# Patient Record
Sex: Male | Born: 1935
Health system: Southern US, Community
[De-identification: ages and names within clinical notes are randomized; demographics above are authoritative.]

## PROBLEM LIST (undated history)

## (undated) ENCOUNTER — Emergency Department: Discharge status: Absent without leave | Payer: Medicare Other

## (undated) DIAGNOSIS — R0789 Other chest pain: Secondary | ICD-10-CM

## (undated) DIAGNOSIS — I2510 Atherosclerotic heart disease of native coronary artery without angina pectoris: Secondary | ICD-10-CM

## (undated) DIAGNOSIS — K219 Gastro-esophageal reflux disease without esophagitis: Secondary | ICD-10-CM

## (undated) DIAGNOSIS — Z9289 Personal history of other medical treatment: Secondary | ICD-10-CM

## (undated) DIAGNOSIS — N183 Chronic kidney disease, stage 3 unspecified: Secondary | ICD-10-CM

## (undated) DIAGNOSIS — H9190 Unspecified hearing loss, unspecified ear: Secondary | ICD-10-CM

## (undated) DIAGNOSIS — F0390 Unspecified dementia without behavioral disturbance: Secondary | ICD-10-CM

## (undated) DIAGNOSIS — K222 Esophageal obstruction: Secondary | ICD-10-CM

## (undated) DIAGNOSIS — F419 Anxiety disorder, unspecified: Secondary | ICD-10-CM

## (undated) DIAGNOSIS — E785 Hyperlipidemia, unspecified: Secondary | ICD-10-CM

## (undated) DIAGNOSIS — C9140 Hairy cell leukemia not having achieved remission: Secondary | ICD-10-CM

## (undated) DIAGNOSIS — I10 Essential (primary) hypertension: Secondary | ICD-10-CM

## (undated) DIAGNOSIS — R001 Bradycardia, unspecified: Secondary | ICD-10-CM

## (undated) DIAGNOSIS — C9590 Leukemia, unspecified not having achieved remission: Secondary | ICD-10-CM

## (undated) DIAGNOSIS — J45909 Unspecified asthma, uncomplicated: Secondary | ICD-10-CM

## (undated) DIAGNOSIS — M1990 Unspecified osteoarthritis, unspecified site: Secondary | ICD-10-CM

## (undated) DIAGNOSIS — N400 Enlarged prostate without lower urinary tract symptoms: Secondary | ICD-10-CM

## (undated) DIAGNOSIS — D649 Anemia, unspecified: Secondary | ICD-10-CM

## (undated) HISTORY — DX: Leukemia, unspecified not having achieved remission: C95.90

## (undated) HISTORY — DX: Benign prostatic hyperplasia without lower urinary tract symptoms: N40.0

## (undated) HISTORY — PX: CATARACT EXTRACTION: SUR2

## (undated) HISTORY — DX: Atherosclerotic heart disease of native coronary artery without angina pectoris: I25.10

## (undated) HISTORY — PX: EYE SURGERY: SHX253

## (undated) HISTORY — DX: Bradycardia, unspecified: R00.1

## (undated) HISTORY — DX: Anxiety disorder, unspecified: F41.9

## (undated) HISTORY — DX: Hairy cell leukemia not having achieved remission: C91.40

## (undated) HISTORY — DX: Hyperlipidemia, unspecified: E78.5

## (undated) HISTORY — PX: UPPER GASTROINTESTINAL ENDOSCOPY: SHX188

## (undated) HISTORY — DX: Other chest pain: R07.89

## (undated) HISTORY — DX: Anemia, unspecified: D64.9

## (undated) HISTORY — DX: Personal history of other medical treatment: Z92.89

## (undated) HISTORY — DX: Esophageal obstruction: K22.2

## (undated) SURGERY — Surgical Case
Anesthesia: *Unknown

---
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
## 1998-08-05 HISTORY — PX: CORONARY ARTERY BYPASS GRAFT: SHX141

## 1999-02-05 ENCOUNTER — Encounter: Payer: Self-pay | Admitting: Cardiology

## 1999-02-08 ENCOUNTER — Inpatient Hospital Stay (HOSPITAL_COMMUNITY): Admission: AD | Admit: 1999-02-08 | Discharge: 1999-02-12 | Payer: Self-pay | Admitting: Cardiology

## 1999-02-08 ENCOUNTER — Encounter: Payer: Self-pay | Admitting: Surgery

## 1999-02-09 ENCOUNTER — Encounter: Payer: Self-pay | Admitting: Surgery

## 1999-02-10 ENCOUNTER — Encounter: Payer: Self-pay | Admitting: Surgery

## 1999-02-11 ENCOUNTER — Encounter: Payer: Self-pay | Admitting: Cardiothoracic Surgery

## 1999-02-12 ENCOUNTER — Encounter: Payer: Self-pay | Admitting: Surgery

## 2005-01-25 ENCOUNTER — Ambulatory Visit: Payer: Self-pay | Admitting: Internal Medicine

## 2005-05-21 ENCOUNTER — Ambulatory Visit: Payer: Self-pay | Admitting: Internal Medicine

## 2005-06-05 ENCOUNTER — Inpatient Hospital Stay: Payer: Self-pay | Admitting: Internal Medicine

## 2005-06-06 ENCOUNTER — Other Ambulatory Visit: Payer: Self-pay

## 2005-06-10 ENCOUNTER — Ambulatory Visit: Payer: Self-pay | Admitting: Internal Medicine

## 2005-06-14 ENCOUNTER — Inpatient Hospital Stay: Payer: Self-pay | Admitting: Internal Medicine

## 2005-07-05 ENCOUNTER — Ambulatory Visit: Payer: Self-pay | Admitting: Internal Medicine

## 2005-08-05 ENCOUNTER — Ambulatory Visit: Payer: Self-pay | Admitting: Internal Medicine

## 2005-09-05 ENCOUNTER — Ambulatory Visit: Payer: Self-pay | Admitting: Internal Medicine

## 2005-10-03 ENCOUNTER — Ambulatory Visit: Payer: Self-pay | Admitting: Internal Medicine

## 2005-11-03 ENCOUNTER — Ambulatory Visit: Payer: Self-pay | Admitting: Internal Medicine

## 2005-12-20 ENCOUNTER — Ambulatory Visit: Payer: Self-pay | Admitting: Internal Medicine

## 2006-01-17 ENCOUNTER — Ambulatory Visit: Payer: Self-pay | Admitting: Internal Medicine

## 2006-02-28 ENCOUNTER — Ambulatory Visit: Payer: Self-pay | Admitting: Internal Medicine

## 2006-03-05 ENCOUNTER — Ambulatory Visit: Payer: Self-pay | Admitting: Internal Medicine

## 2006-04-18 ENCOUNTER — Ambulatory Visit: Payer: Self-pay | Admitting: Internal Medicine

## 2006-05-05 ENCOUNTER — Ambulatory Visit: Payer: Self-pay | Admitting: Internal Medicine

## 2006-06-13 ENCOUNTER — Ambulatory Visit: Payer: Self-pay | Admitting: Internal Medicine

## 2006-07-05 ENCOUNTER — Ambulatory Visit: Payer: Self-pay | Admitting: Internal Medicine

## 2006-08-08 ENCOUNTER — Ambulatory Visit: Payer: Self-pay | Admitting: Internal Medicine

## 2006-09-05 ENCOUNTER — Ambulatory Visit: Payer: Self-pay | Admitting: Internal Medicine

## 2006-10-06 ENCOUNTER — Ambulatory Visit: Payer: Self-pay | Admitting: Internal Medicine

## 2006-11-04 ENCOUNTER — Ambulatory Visit: Payer: Self-pay | Admitting: Internal Medicine

## 2006-12-04 ENCOUNTER — Ambulatory Visit: Payer: Self-pay | Admitting: Internal Medicine

## 2006-12-12 ENCOUNTER — Ambulatory Visit: Payer: Self-pay | Admitting: Internal Medicine

## 2007-01-04 ENCOUNTER — Ambulatory Visit: Payer: Self-pay | Admitting: Internal Medicine

## 2007-01-16 ENCOUNTER — Ambulatory Visit: Payer: Self-pay | Admitting: Internal Medicine

## 2007-02-03 ENCOUNTER — Ambulatory Visit: Payer: Self-pay | Admitting: Internal Medicine

## 2007-03-06 ENCOUNTER — Ambulatory Visit: Payer: Self-pay | Admitting: Internal Medicine

## 2007-03-13 ENCOUNTER — Ambulatory Visit: Payer: Self-pay | Admitting: Gastroenterology

## 2007-03-23 ENCOUNTER — Ambulatory Visit: Payer: Self-pay | Admitting: Internal Medicine

## 2007-04-06 ENCOUNTER — Ambulatory Visit: Payer: Self-pay | Admitting: Internal Medicine

## 2007-05-15 ENCOUNTER — Ambulatory Visit: Payer: Self-pay | Admitting: Internal Medicine

## 2007-06-06 ENCOUNTER — Ambulatory Visit: Payer: Self-pay | Admitting: Internal Medicine

## 2007-07-06 ENCOUNTER — Ambulatory Visit: Payer: Self-pay | Admitting: Internal Medicine

## 2007-07-17 ENCOUNTER — Ambulatory Visit: Payer: Self-pay | Admitting: Internal Medicine

## 2007-08-06 ENCOUNTER — Ambulatory Visit: Payer: Self-pay | Admitting: Internal Medicine

## 2007-09-18 ENCOUNTER — Ambulatory Visit: Payer: Self-pay | Admitting: Internal Medicine

## 2007-10-04 ENCOUNTER — Ambulatory Visit: Payer: Self-pay | Admitting: Internal Medicine

## 2007-11-26 ENCOUNTER — Ambulatory Visit: Payer: Self-pay | Admitting: Internal Medicine

## 2007-12-04 ENCOUNTER — Ambulatory Visit: Payer: Self-pay | Admitting: Internal Medicine

## 2008-01-04 ENCOUNTER — Ambulatory Visit: Payer: Self-pay | Admitting: Internal Medicine

## 2008-01-15 ENCOUNTER — Ambulatory Visit: Payer: Self-pay | Admitting: Internal Medicine

## 2008-02-03 ENCOUNTER — Ambulatory Visit: Payer: Self-pay | Admitting: Internal Medicine

## 2008-03-15 ENCOUNTER — Ambulatory Visit: Payer: Self-pay | Admitting: Internal Medicine

## 2008-04-05 ENCOUNTER — Ambulatory Visit: Payer: Self-pay | Admitting: Internal Medicine

## 2008-05-17 ENCOUNTER — Ambulatory Visit: Payer: Self-pay | Admitting: Internal Medicine

## 2008-06-05 ENCOUNTER — Ambulatory Visit: Payer: Self-pay | Admitting: Internal Medicine

## 2008-07-05 ENCOUNTER — Ambulatory Visit: Payer: Self-pay | Admitting: Internal Medicine

## 2008-07-19 ENCOUNTER — Ambulatory Visit: Payer: Self-pay | Admitting: Internal Medicine

## 2008-08-05 ENCOUNTER — Ambulatory Visit: Payer: Self-pay | Admitting: Internal Medicine

## 2008-09-05 ENCOUNTER — Ambulatory Visit: Payer: Self-pay | Admitting: Internal Medicine

## 2008-09-13 ENCOUNTER — Ambulatory Visit: Payer: Self-pay | Admitting: Internal Medicine

## 2008-10-03 ENCOUNTER — Ambulatory Visit: Payer: Self-pay | Admitting: Internal Medicine

## 2008-11-15 ENCOUNTER — Ambulatory Visit: Payer: Self-pay | Admitting: Internal Medicine

## 2008-12-03 ENCOUNTER — Ambulatory Visit: Payer: Self-pay | Admitting: Internal Medicine

## 2009-01-03 ENCOUNTER — Ambulatory Visit: Payer: Self-pay | Admitting: Internal Medicine

## 2009-02-02 ENCOUNTER — Ambulatory Visit: Payer: Self-pay | Admitting: Internal Medicine

## 2009-03-05 ENCOUNTER — Ambulatory Visit: Payer: Self-pay | Admitting: Internal Medicine

## 2009-04-05 ENCOUNTER — Ambulatory Visit: Payer: Self-pay | Admitting: Internal Medicine

## 2009-05-05 ENCOUNTER — Ambulatory Visit: Payer: Self-pay | Admitting: Internal Medicine

## 2009-06-05 ENCOUNTER — Ambulatory Visit: Payer: Self-pay | Admitting: Internal Medicine

## 2009-06-26 ENCOUNTER — Ambulatory Visit: Payer: Self-pay | Admitting: Internal Medicine

## 2009-07-05 ENCOUNTER — Ambulatory Visit: Payer: Self-pay | Admitting: Internal Medicine

## 2009-08-05 ENCOUNTER — Ambulatory Visit: Payer: Self-pay | Admitting: Internal Medicine

## 2009-09-04 ENCOUNTER — Encounter: Payer: Self-pay | Admitting: Cardiovascular Disease

## 2009-09-05 ENCOUNTER — Ambulatory Visit: Payer: Self-pay | Admitting: Internal Medicine

## 2009-10-03 ENCOUNTER — Ambulatory Visit: Payer: Self-pay | Admitting: Internal Medicine

## 2009-10-27 DIAGNOSIS — E785 Hyperlipidemia, unspecified: Secondary | ICD-10-CM

## 2009-11-03 ENCOUNTER — Ambulatory Visit: Payer: Self-pay | Admitting: Internal Medicine

## 2009-12-03 ENCOUNTER — Ambulatory Visit: Payer: Self-pay | Admitting: Internal Medicine

## 2010-01-03 ENCOUNTER — Ambulatory Visit: Payer: Self-pay | Admitting: Internal Medicine

## 2010-02-02 ENCOUNTER — Ambulatory Visit: Payer: Self-pay | Admitting: Internal Medicine

## 2010-02-08 ENCOUNTER — Ambulatory Visit: Payer: Self-pay | Admitting: Ophthalmology

## 2010-02-19 ENCOUNTER — Ambulatory Visit: Payer: Self-pay | Admitting: Ophthalmology

## 2010-02-27 ENCOUNTER — Ambulatory Visit: Payer: Self-pay | Admitting: Internal Medicine

## 2010-03-05 ENCOUNTER — Ambulatory Visit: Payer: Self-pay | Admitting: Internal Medicine

## 2010-04-05 ENCOUNTER — Ambulatory Visit: Payer: Self-pay | Admitting: Internal Medicine

## 2010-05-05 ENCOUNTER — Ambulatory Visit: Payer: Self-pay | Admitting: Internal Medicine

## 2010-06-05 ENCOUNTER — Ambulatory Visit: Payer: Self-pay | Admitting: Internal Medicine

## 2010-07-05 ENCOUNTER — Ambulatory Visit: Payer: Self-pay | Admitting: Internal Medicine

## 2010-08-05 ENCOUNTER — Ambulatory Visit: Payer: Self-pay | Admitting: Internal Medicine

## 2010-08-16 ENCOUNTER — Ambulatory Visit: Payer: Self-pay | Admitting: Internal Medicine

## 2010-09-04 NOTE — Letter (Signed)
Summary: Medical Record Release  Medical Record Release   Imported By: Harlon Flor 10/30/2009 16:24:57  _____________________________________________________________________  External Attachment:    Type:   Image     Comment:   External Document

## 2010-09-05 ENCOUNTER — Ambulatory Visit: Payer: Self-pay | Admitting: Internal Medicine

## 2010-10-18 ENCOUNTER — Ambulatory Visit: Payer: Self-pay | Admitting: Internal Medicine

## 2010-10-22 ENCOUNTER — Encounter: Payer: Self-pay | Admitting: Cardiovascular Disease

## 2010-10-23 ENCOUNTER — Other Ambulatory Visit: Payer: Self-pay | Admitting: Cardiovascular Disease

## 2010-10-23 MED ORDER — PINDOLOL 5 MG PO TABS: 5.0000 mg | ORAL_TABLET | Freq: Two times a day (BID) | ORAL | Status: DC

## 2010-10-23 NOTE — Telephone Encounter (Signed)
Pt needed a refill before appt in 11/2010.  rx sent to pharmacy

## 2010-11-01 NOTE — Miscellaneous (Signed)
Summary: Refill  Clinical Lists Changes  Medications: Added new medication of PINDOLOL 5 MG TABS (PINDOLOL) Take one tablet by mouth twice a day - Signed Rx of PINDOLOL 5 MG TABS (PINDOLOL) Take one tablet by mouth twice a day;  #90 x 4;  Signed;  Entered by: Lysbeth Galas CMA;  Authorized by: Dossie Arbour MD;  Method used: Faxed to [facility] [place], , , [place]  , Ph: [phone], Fax: [phone]  Made appt for 11/08/10 @ 10:45/sab  Prescriptions: PINDOLOL 5 MG TABS (PINDOLOL) Take one tablet by mouth twice a day  #90 x 4   Entered by:   Lysbeth Galas CMA   Authorized by:   Dossie Arbour MD   Signed by:   Lysbeth Galas CMA on 10/22/2010   Method used:   Faxed to ...       [facility] [place] (mail-order)             , [place]         Ph: [phone]       Fax: [phone]   RxID:   [id number]

## 2010-11-04 ENCOUNTER — Ambulatory Visit: Payer: Self-pay | Admitting: Internal Medicine

## 2010-11-08 ENCOUNTER — Encounter: Payer: Self-pay | Admitting: Cardiovascular Disease

## 2010-11-08 ENCOUNTER — Ambulatory Visit (INDEPENDENT_AMBULATORY_CARE_PROVIDER_SITE_OTHER): Payer: Medicare Other | Admitting: Cardiovascular Disease

## 2010-11-08 DIAGNOSIS — I498 Other specified cardiac arrhythmias: Secondary | ICD-10-CM

## 2010-11-08 DIAGNOSIS — I10 Essential (primary) hypertension: Secondary | ICD-10-CM

## 2010-11-08 DIAGNOSIS — E785 Hyperlipidemia, unspecified: Secondary | ICD-10-CM

## 2010-11-08 DIAGNOSIS — Z951 Presence of aortocoronary bypass graft: Secondary | ICD-10-CM

## 2010-11-08 DIAGNOSIS — R001 Bradycardia, unspecified: Secondary | ICD-10-CM | POA: Insufficient documentation

## 2010-11-08 MED ORDER — ASPIRIN 81 MG PO TBEC: 162.0000 mg | DELAYED_RELEASE_TABLET | Freq: Every day | ORAL | Status: DC

## 2010-11-08 NOTE — Progress Notes (Signed)
   Patient ID: Frank Mcdaniel, male    DOB: 02-07-1933, 77 y.o.   MRN: [id number]  HPI Comments: 77 year old gentleman with a history of coronary artery disease, bypass in July 2000, hairy cell leukemia, followed by Dr. Lorre Nick, who presents for routine followup. Also a history of hyperlipidemia.  He reports that he is doing well. He has gained 10 pounds since he was last seen in clinic in February 2010 at [facility]. He denies any shortness of breath, chest pain. He is active though has not been going to the gym like he normally does. Otherwise feels well with no complaints.  EKG shows sinus bradycardia with rate 53 beats per minute, right bundle branch block  Previous echocardiogram March 2007 was essentially normal  Last stress test March 2010 was a lexiscan, ejection fraction 65%, no ischemia noted, no EKG changes     Review of Systems  Constitutional: Positive for unexpected weight change.  HENT: Negative.   Eyes: Negative.   Respiratory: Negative.   Cardiovascular: Negative.   Gastrointestinal: Negative.   Musculoskeletal: Negative.   Skin: Negative.   Neurological: Negative.   Hematological: Negative.   Psychiatric/Behavioral: Negative.   All other systems reviewed and are negative.   BP 134/78  Pulse 53  Ht 5\' 9"  (1.753 m)  Wt 180 lb (81.647 kg)  BMI 26.58 kg/m2   Physical Exam  Nursing note and vitals reviewed. Constitutional: He is oriented to person, place, and time. He appears well-developed and well-nourished.  HENT:  Head: Normocephalic.  Nose: Nose normal.  Mouth/Throat: Oropharynx is clear and moist.  Eyes: Conjunctivae are normal. Pupils are equal, round, and reactive to light.  Neck: Normal range of motion. Neck supple. No JVD present.  Cardiovascular: Normal rate, regular rhythm, normal heart sounds and intact distal pulses.  Exam reveals no gallop and no friction rub.   No murmur heard. Pulmonary/Chest: Effort normal and  breath sounds normal. No respiratory distress. He has no wheezes. He has no rales. He exhibits no tenderness.  Abdominal: Soft. Bowel sounds are normal. He exhibits no distension. There is no tenderness.  Musculoskeletal: Normal range of motion. He exhibits no edema and no tenderness.  Lymphadenopathy:    He has no cervical adenopathy.  Neurological: He is alert and oriented to person, place, and time. Coordination normal.  Skin: Skin is warm and dry. No rash noted. No erythema.  Psychiatric: He has a normal mood and affect. His behavior is normal. Judgment and thought content normal.           Assessment and Plan

## 2010-11-08 NOTE — Assessment & Plan Note (Signed)
Currently with no symptoms of angina. No further workup at this time. Continue current medication regimen. 

## 2010-11-08 NOTE — Patient Instructions (Addendum)
You are doing well. INCREASE Aspirin to 2 tablets. Please call us if you have new issues that need to be addressed before your next appt.  We will call you for a follow up Appt.

## 2010-11-08 NOTE — Assessment & Plan Note (Signed)
Blood pressure is well controlled no changes made to his medication.

## 2010-11-08 NOTE — Assessment & Plan Note (Signed)
We will try to obtain his most recent lipid panel from Dr. Vear Clock. Continue him on his current medication regimen for now. We have talked to him about his recent weight gain of 10 pounds and encouraged him to increase his exercise and watch his diet.

## 2010-11-08 NOTE — Assessment & Plan Note (Signed)
He has asymptomatic bradycardia. Likely exacerbated from his low dose beta blocker. He is asymptomatic and we have not changed his medications.

## 2010-11-26 ENCOUNTER — Encounter: Payer: Self-pay | Admitting: Cardiovascular Disease

## 2010-11-28 ENCOUNTER — Encounter: Payer: Self-pay | Admitting: Cardiovascular Disease

## 2010-12-04 ENCOUNTER — Ambulatory Visit: Payer: Self-pay | Admitting: Internal Medicine

## 2011-01-04 ENCOUNTER — Ambulatory Visit: Payer: Self-pay | Admitting: Internal Medicine

## 2011-02-03 ENCOUNTER — Ambulatory Visit: Payer: Self-pay | Admitting: Internal Medicine

## 2011-03-06 ENCOUNTER — Ambulatory Visit: Payer: Self-pay | Admitting: Internal Medicine

## 2011-04-06 ENCOUNTER — Ambulatory Visit: Payer: Self-pay | Admitting: Internal Medicine

## 2011-05-06 ENCOUNTER — Ambulatory Visit: Payer: Self-pay | Admitting: Internal Medicine

## 2011-06-06 ENCOUNTER — Ambulatory Visit: Payer: Self-pay | Admitting: Internal Medicine

## 2011-06-14 ENCOUNTER — Telehealth: Payer: Self-pay

## 2011-06-14 MED ORDER — PINDOLOL 5 MG PO TABS: 5.0000 mg | ORAL_TABLET | Freq: Every day | ORAL | Status: DC

## 2011-06-14 NOTE — Telephone Encounter (Signed)
Refill sent for pindolol.

## 2011-07-11 ENCOUNTER — Ambulatory Visit: Payer: Self-pay | Admitting: Internal Medicine

## 2011-08-06 ENCOUNTER — Ambulatory Visit: Payer: Self-pay | Admitting: Internal Medicine

## 2011-08-22 LAB — CBC CANCER CENTER
Basophil #: 0 x10 3/mm (ref 0.0–0.1)
Basophil %: 0.3 %
Eosinophil #: 0.3 x10 3/mm (ref 0.0–0.7)
Eosinophil %: 5.5 %
HCT: 34.2 % — ABNORMAL LOW (ref 40.0–52.0)
HGB: 11.5 g/dL — ABNORMAL LOW (ref 13.0–18.0)
Lymphocyte %: 16.2 %
MCH: 34.1 pg — ABNORMAL HIGH (ref 26.0–34.0)
MCHC: 33.6 g/dL (ref 32.0–36.0)
MCV: 102 fL — ABNORMAL HIGH (ref 80–100)
Neutrophil #: 4.2 x10 3/mm (ref 1.4–6.5)
Neutrophil %: 67 %

## 2011-08-22 LAB — HEPATIC FUNCTION PANEL A (ARMC)
Alkaline Phosphatase: 88 U/L (ref 50–136)
Bilirubin, Direct: 0.2 mg/dL (ref 0.00–0.20)

## 2011-08-22 LAB — URIC ACID: Uric Acid: 6.2 mg/dL (ref 3.5–7.2)

## 2011-08-22 LAB — CREATININE, SERUM: Creatinine: 1.52 mg/dL — ABNORMAL HIGH (ref 0.60–1.30)

## 2011-08-22 LAB — LACTATE DEHYDROGENASE: LDH: 204 U/L (ref 87–241)

## 2011-09-06 ENCOUNTER — Ambulatory Visit: Payer: Self-pay | Admitting: Internal Medicine

## 2011-10-03 LAB — CBC CANCER CENTER
Basophil %: 0.2 %
Eosinophil #: 0.5 x10 3/mm (ref 0.0–0.7)
Eosinophil %: 7.5 %
HCT: 33.8 % — ABNORMAL LOW (ref 40.0–52.0)
Lymphocyte #: 1.2 x10 3/mm (ref 1.0–3.6)
MCH: 34.1 pg — ABNORMAL HIGH (ref 26.0–34.0)
MCV: 103 fL — ABNORMAL HIGH (ref 80–100)
Monocyte #: 0.8 x10 3/mm — ABNORMAL HIGH (ref 0.0–0.7)
Monocyte %: 12.1 %
Neutrophil #: 4 x10 3/mm (ref 1.4–6.5)
Platelet: 186 x10 3/mm (ref 150–440)
WBC: 6.5 x10 3/mm (ref 3.8–10.6)

## 2011-10-03 LAB — CREATININE, SERUM
Creatinine: 1.46 mg/dL — ABNORMAL HIGH (ref 0.60–1.30)
EGFR (Non-African Amer.): 50 — ABNORMAL LOW

## 2011-10-03 LAB — LACTATE DEHYDROGENASE: LDH: 214 U/L (ref 87–241)

## 2011-10-04 ENCOUNTER — Ambulatory Visit: Payer: Self-pay | Admitting: Internal Medicine

## 2011-11-12 ENCOUNTER — Ambulatory Visit (INDEPENDENT_AMBULATORY_CARE_PROVIDER_SITE_OTHER): Payer: Medicare Other | Admitting: Cardiovascular Disease

## 2011-11-12 ENCOUNTER — Encounter: Payer: Self-pay | Admitting: Cardiovascular Disease

## 2011-11-12 VITALS — BP 128/64 | HR 53 | Ht 69.0 in | Wt 178.5 lb

## 2011-11-12 DIAGNOSIS — R001 Bradycardia, unspecified: Secondary | ICD-10-CM

## 2011-11-12 DIAGNOSIS — I10 Essential (primary) hypertension: Secondary | ICD-10-CM

## 2011-11-12 DIAGNOSIS — Z951 Presence of aortocoronary bypass graft: Secondary | ICD-10-CM

## 2011-11-12 DIAGNOSIS — E785 Hyperlipidemia, unspecified: Secondary | ICD-10-CM

## 2011-11-12 DIAGNOSIS — I498 Other specified cardiac arrhythmias: Secondary | ICD-10-CM

## 2011-11-12 NOTE — Assessment & Plan Note (Signed)
Relatively asymptomatic from his bradycardia. We have suggested if he has significant fatigue, to cut his b-blocker in half. He could even stop the medication if significant symptoms

## 2011-11-12 NOTE — Assessment & Plan Note (Signed)
Currently with no symptoms of angina. No further workup at this time. Continue current medication regimen. 

## 2011-11-12 NOTE — Assessment & Plan Note (Signed)
Blood pressure is well controlled on today's visit. No changes made to the medications. 

## 2011-11-12 NOTE — Progress Notes (Signed)
Patient ID: Frank Mcdaniel, male    DOB: 31-May-1933, 78 y.o.   MRN: [id number]  HPI Comments: 78 year old gentleman with a history of coronary artery disease, bypass in July 2000, hairy cell leukemia, followed by Dr. Lorre Nick, who presents for routine followup. Also a history of hyperlipidemia.  He reports that he is doing well. He denies any shortness of breath, chest pain. He is active. 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 He does have rare fatigue. It does not seem he has significant problems from bradycardia which was seen previously . Otherwise feels well with no complaints.  EKG shows sinus bradycardia with rate 53 beats per minute, right bundle branch block  Previous echocardiogram March 2007 was essentially normal  Last stress test March 2010 was a lexiscan, ejection fraction 65%, no ischemia noted, no EKG changes    Outpatient Encounter Prescriptions as of 11/12/2011  Medication Sig Dispense Refill  . acyclovir (ZOVIRAX) 400 MG tablet Take 400 mg by mouth 2 (two) times daily.        [name] aspirin 81 MG EC tablet Take 2 tablets (162 mg total) by mouth daily.  30 tablet  0  . atorvastatin (LIPITOR) 40 MG tablet Take 40 mg by mouth daily. Take 1/2 tablet on Monday, Wednesday, Friday, Saturday and Sunday. Take 1 tablet on Tuesday and Thursday.      . dapsone 100 MG tablet Take 100 mg by mouth daily.        [name] dutasteride (AVODART) 0.5 MG capsule Take 0.5 mg by mouth daily. Take Monday, Wednesday, and friday       . Multiple Vitamin (MULTIVITAMIN) tablet Take 1 tablet by mouth daily.        [name] PARoxetine (PAXIL) 10 MG tablet Take 10 mg by mouth every morning.        . pindolol (VISKEN) 5 MG tablet Take 1 tablet (5 mg total) by mouth daily.  90 tablet  3  . Tamsulosin HCl (FLOMAX) 0.4 MG CAPS Take 0.4 mg by mouth daily.          Review of Systems  Constitutional: Positive for fatigue.  HENT: Negative.   Eyes: Negative.   Respiratory: Negative.     Cardiovascular: Negative.   Gastrointestinal: Negative.   Musculoskeletal: Negative.   Skin: Negative.   Neurological: Negative.   Hematological: Negative.   Psychiatric/Behavioral: Negative.   All other systems reviewed and are negative.    BP 128/64  Pulse 53  Ht 5\' 9"  (1.753 m)  Wt 178 lb 8 oz (80.967 kg)  BMI 26.36 kg/m2  Physical Exam  Nursing note and vitals reviewed. Constitutional: He is oriented to person, place, and time. He appears well-developed and well-nourished.  HENT:  Head: Normocephalic.  Nose: Nose normal.  Mouth/Throat: Oropharynx is clear and moist.  Eyes: Conjunctivae are normal. Pupils are equal, round, and reactive to light.  Neck: Normal range of motion. Neck supple. No JVD present.  Cardiovascular: Normal rate, regular rhythm, S1 normal, S2 normal, normal heart sounds and intact distal pulses.  Exam reveals no gallop and no friction rub.   No murmur heard. Pulmonary/Chest: Effort normal and breath sounds normal. No respiratory distress. He has no wheezes. He has no rales. He exhibits no tenderness.  Abdominal: Soft. Bowel sounds are normal. He exhibits no distension. There is no tenderness.  Musculoskeletal: Normal range of motion. He exhibits no edema and no tenderness.  Lymphadenopathy:    He has no cervical  adenopathy.  Neurological: He is alert and oriented to person, place, and time. Coordination normal.  Skin: Skin is warm and dry. No rash noted. No erythema.  Psychiatric: He has a normal mood and affect. His behavior is normal. Judgment and thought content normal.           Assessment and Plan

## 2011-11-12 NOTE — Assessment & Plan Note (Signed)
Cholesterol is at goal on the current lipid regimen. No changes to the medications were made.  

## 2011-11-12 NOTE — Patient Instructions (Signed)
You are doing well. No medication changes were made.  Goal total cholesterol is 150 or less Goal LDL less than 70  Please call us if you have new issues that need to be addressed before your next appt.  Your physician wants you to follow-up in: 6 months.  You will receive a reminder letter in the mail two months in advance. If you don't receive a letter, please call our office to schedule the follow-up appointment.

## 2011-11-14 ENCOUNTER — Ambulatory Visit: Payer: Self-pay | Admitting: Internal Medicine

## 2011-11-14 LAB — CBC CANCER CENTER
Eosinophil #: 0.3 x10 3/mm (ref 0.0–0.7)
Eosinophil %: 4.1 %
Lymphocyte #: 1.1 x10 3/mm (ref 1.0–3.6)
MCH: 34.3 pg — ABNORMAL HIGH (ref 26.0–34.0)
Monocyte %: 9.7 %
Neutrophil #: 5 x10 3/mm (ref 1.4–6.5)
Platelet: 186 x10 3/mm (ref 150–440)
WBC: 7.1 x10 3/mm (ref 3.8–10.6)

## 2011-11-14 LAB — CREATININE, SERUM
Creatinine: 1.66 mg/dL — ABNORMAL HIGH (ref 0.60–1.30)
EGFR (African American): 52 — ABNORMAL LOW

## 2011-11-14 LAB — LACTATE DEHYDROGENASE: LDH: 191 U/L (ref 87–241)

## 2011-11-28 LAB — CBC CANCER CENTER
Basophil #: 0 x10 3/mm (ref 0.0–0.1)
Basophil %: 0.4 %
Eosinophil #: 0.3 x10 3/mm (ref 0.0–0.7)
Eosinophil %: 4.8 %
HCT: 33.8 % — ABNORMAL LOW (ref 40.0–52.0)
Lymphocyte #: 0.8 x10 3/mm — ABNORMAL LOW (ref 1.0–3.6)
Lymphocyte %: 12.9 %
MCH: 33.2 pg (ref 26.0–34.0)
MCHC: 31.9 g/dL — ABNORMAL LOW (ref 32.0–36.0)
Monocyte #: 0.6 x10 3/mm (ref 0.2–1.0)
Monocyte %: 9.2 %
Neutrophil #: 4.4 x10 3/mm (ref 1.4–6.5)
RBC: 3.24 10*6/uL — ABNORMAL LOW (ref 4.40–5.90)
RDW: 14.4 % (ref 11.5–14.5)
WBC: 6 x10 3/mm (ref 3.8–10.6)

## 2011-11-28 LAB — CREATININE, SERUM
Creatinine: 1.4 mg/dL — ABNORMAL HIGH (ref 0.60–1.30)
EGFR (Non-African Amer.): 48 — ABNORMAL LOW

## 2011-12-04 ENCOUNTER — Ambulatory Visit: Payer: Self-pay | Admitting: Internal Medicine

## 2011-12-26 LAB — CBC CANCER CENTER
Basophil #: 0 x10 3/mm (ref 0.0–0.1)
Basophil %: 0.4 %
Eosinophil %: 5.7 %
HCT: 34.1 % — ABNORMAL LOW (ref 40.0–52.0)
Lymphocyte #: 0.9 x10 3/mm — ABNORMAL LOW (ref 1.0–3.6)
Lymphocyte %: 13.6 %
MCH: 33.3 pg (ref 26.0–34.0)
MCHC: 32.3 g/dL (ref 32.0–36.0)
MCV: 103 fL — ABNORMAL HIGH (ref 80–100)
Monocyte #: 0.5 x10 3/mm (ref 0.2–1.0)
Monocyte %: 8.4 %
Neutrophil #: 4.7 x10 3/mm (ref 1.4–6.5)
Platelet: 175 x10 3/mm (ref 150–440)
RBC: 3.3 10*6/uL — ABNORMAL LOW (ref 4.40–5.90)
WBC: 6.5 x10 3/mm (ref 3.8–10.6)

## 2011-12-26 LAB — HEPATIC FUNCTION PANEL A (ARMC)
Albumin: 3.8 g/dL (ref 3.4–5.0)
Alkaline Phosphatase: 76 U/L (ref 50–136)
Bilirubin, Direct: 0.3 mg/dL — ABNORMAL HIGH (ref 0.00–0.20)
Bilirubin,Total: 0.9 mg/dL (ref 0.2–1.0)
SGOT(AST): 25 U/L (ref 15–37)
SGPT (ALT): 19 U/L

## 2011-12-26 LAB — CREATININE, SERUM
Creatinine: 1.49 mg/dL — ABNORMAL HIGH (ref 0.60–1.30)
EGFR (African American): 52 — ABNORMAL LOW
EGFR (Non-African Amer.): 45 — ABNORMAL LOW

## 2012-01-04 ENCOUNTER — Ambulatory Visit: Payer: Self-pay | Admitting: Internal Medicine

## 2012-02-14 ENCOUNTER — Ambulatory Visit: Payer: Self-pay | Admitting: Internal Medicine

## 2012-02-14 LAB — CREATININE, SERUM
EGFR (African American): 55 — ABNORMAL LOW
EGFR (Non-African Amer.): 48 — ABNORMAL LOW

## 2012-02-14 LAB — CBC CANCER CENTER
Basophil %: 0.3 %
Eosinophil #: 0.4 x10 3/mm (ref 0.0–0.7)
Eosinophil %: 6 %
HCT: 32.7 % — ABNORMAL LOW (ref 40.0–52.0)
HGB: 10.6 g/dL — ABNORMAL LOW (ref 13.0–18.0)
Lymphocyte #: 1.1 x10 3/mm (ref 1.0–3.6)
MCH: 34 pg (ref 26.0–34.0)
MCHC: 32.4 g/dL (ref 32.0–36.0)
MCV: 105 fL — ABNORMAL HIGH (ref 80–100)
Monocyte #: 0.6 x10 3/mm (ref 0.2–1.0)
Monocyte %: 10.6 %
Neutrophil #: 3.8 x10 3/mm (ref 1.4–6.5)
Neutrophil %: 64.9 %
RBC: 3.12 10*6/uL — ABNORMAL LOW (ref 4.40–5.90)

## 2012-02-14 LAB — LACTATE DEHYDROGENASE: LDH: 196 U/L

## 2012-03-05 ENCOUNTER — Ambulatory Visit: Payer: Self-pay | Admitting: Internal Medicine

## 2012-04-02 LAB — CBC CANCER CENTER
Basophil #: 0 x10 3/mm (ref 0.0–0.1)
Basophil %: 0.4 %
Eosinophil #: 0.3 x10 3/mm (ref 0.0–0.7)
Eosinophil %: 4.3 %
HGB: 10.7 g/dL — ABNORMAL LOW (ref 13.0–18.0)
Lymphocyte %: 16.8 %
MCH: 33.8 pg (ref 26.0–34.0)
MCHC: 32.1 g/dL (ref 32.0–36.0)
Neutrophil #: 4.7 x10 3/mm (ref 1.4–6.5)
Neutrophil %: 68.6 %
Platelet: 191 x10 3/mm (ref 150–440)
RBC: 3.17 10*6/uL — ABNORMAL LOW (ref 4.40–5.90)

## 2012-04-02 LAB — IRON AND TIBC
Iron Bind.Cap.(Total): 239 ug/dL — ABNORMAL LOW (ref 250–450)
Iron Saturation: 23 %
Iron: 56 ug/dL — ABNORMAL LOW (ref 65–175)
Unbound Iron-Bind.Cap.: 183 ug/dL

## 2012-04-02 LAB — CREATININE, SERUM: EGFR (Non-African Amer.): 40 — ABNORMAL LOW

## 2012-04-02 LAB — FERRITIN: Ferritin (ARMC): 380 ng/mL (ref 8–388)

## 2012-04-02 LAB — LACTATE DEHYDROGENASE: LDH: 213 U/L (ref 81–234)

## 2012-04-05 ENCOUNTER — Ambulatory Visit: Payer: Self-pay | Admitting: Internal Medicine

## 2012-05-08 ENCOUNTER — Ambulatory Visit: Payer: Self-pay | Admitting: Internal Medicine

## 2012-05-08 LAB — CBC CANCER CENTER
Eosinophil #: 0.5 x10 3/mm (ref 0.0–0.7)
Eosinophil %: 7.3 %
HGB: 11.3 g/dL — ABNORMAL LOW (ref 13.0–18.0)
Lymphocyte %: 16.1 %
MCH: 33.5 pg (ref 26.0–34.0)
MCHC: 31.7 g/dL — ABNORMAL LOW (ref 32.0–36.0)
MCV: 106 fL — ABNORMAL HIGH (ref 80–100)
Monocyte #: 0.5 x10 3/mm (ref 0.2–1.0)
Neutrophil #: 4.5 x10 3/mm (ref 1.4–6.5)
Neutrophil %: 68.6 %
RDW: 14.4 % (ref 11.5–14.5)

## 2012-05-08 LAB — HEPATIC FUNCTION PANEL A (ARMC)
Bilirubin, Direct: 0.2 mg/dL (ref 0.00–0.20)
SGOT(AST): 24 U/L (ref 15–37)
SGPT (ALT): 18 U/L (ref 12–78)
Total Protein: 7.2 g/dL (ref 6.4–8.2)

## 2012-05-08 LAB — CREATININE, SERUM: EGFR (African American): 53 — ABNORMAL LOW

## 2012-05-08 LAB — LACTATE DEHYDROGENASE: LDH: 186 U/L (ref 85–241)

## 2012-05-11 ENCOUNTER — Encounter: Payer: Self-pay | Admitting: Cardiovascular Disease

## 2012-05-11 ENCOUNTER — Ambulatory Visit (INDEPENDENT_AMBULATORY_CARE_PROVIDER_SITE_OTHER): Payer: Medicare Other | Admitting: Cardiovascular Disease

## 2012-05-11 VITALS — BP 108/60 | HR 50 | Ht 69.0 in | Wt 175.2 lb

## 2012-05-11 DIAGNOSIS — E785 Hyperlipidemia, unspecified: Secondary | ICD-10-CM

## 2012-05-11 DIAGNOSIS — I10 Essential (primary) hypertension: Secondary | ICD-10-CM

## 2012-05-11 DIAGNOSIS — I498 Other specified cardiac arrhythmias: Secondary | ICD-10-CM

## 2012-05-11 DIAGNOSIS — R001 Bradycardia, unspecified: Secondary | ICD-10-CM

## 2012-05-11 DIAGNOSIS — Z951 Presence of aortocoronary bypass graft: Secondary | ICD-10-CM

## 2012-05-11 NOTE — Assessment & Plan Note (Signed)
Blood pressure is well controlled on today's visit. No changes made to the medications. 

## 2012-05-11 NOTE — Assessment & Plan Note (Signed)
Asymptomatic bradycardia. We have suggested he could decrease his beta blocker in half.

## 2012-05-11 NOTE — Assessment & Plan Note (Signed)
Currently with no symptoms of angina. No further workup at this time. Continue current medication regimen. 

## 2012-05-11 NOTE — Patient Instructions (Addendum)
You are doing well. No medication changes were made.  If you have fatigue or dizziness, cut the pindolol in 1/2 daily  Please call us if you have new issues that need to be addressed before your next appt.  Your physician wants you to follow-up in: 6 months.  You will receive a reminder letter in the mail two months in advance. If you don't receive a letter, please call our office to schedule the follow-up appointment.

## 2012-05-11 NOTE — Progress Notes (Signed)
Patient ID: Frank Mcdaniel, male    DOB: March 08, 1933, 78 y.o.   MRN: [id number]  HPI Comments: 78 year old gentleman with a history of coronary artery disease, bypass in July 2000, hairy cell leukemia, followed by Dr. Lorre Nick,  history of hyperlipidemia.who presents for routine followup.   He reports that he is doing well. He denies any shortness of breath, chest pain. He is active. It does not seem he has significant problems from bradycardia. Otherwise feels well with no complaints.  Previous echocardiogram March 2007 was essentially normal  Last stress test March 2010 was a lexiscan, ejection fraction 65%, no ischemia noted, no EKG changes  EKG shows sinus bradycardia with rate 50 beats per minute, right bundle branch block     Outpatient Encounter Prescriptions as of 05/11/2012  Medication Sig Dispense Refill  . acyclovir (ZOVIRAX) 400 MG tablet Take 400 mg by mouth 2 (two) times daily.        [name] aspirin 81 MG EC tablet Take 2 tablets (162 mg total) by mouth daily.  30 tablet  0  . atorvastatin (LIPITOR) 40 MG tablet Take 40 mg by mouth daily. Take 1/2 tablet on Monday, Wednesday, Friday, Saturday and Sunday. Take 1 tablet on Tuesday and Thursday.      . dapsone 100 MG tablet Take 100 mg by mouth daily.        . Multiple Vitamin (MULTIVITAMIN) tablet Take 1 tablet by mouth daily.        [name] PARoxetine (PAXIL) 10 MG tablet Take 10 mg by mouth every morning.        . pindolol (VISKEN) 5 MG tablet Take 1 tablet (5 mg total) by mouth daily.  90 tablet  3  . Tamsulosin HCl (FLOMAX) 0.4 MG CAPS Take 0.4 mg by mouth daily.        [name] DISCONTD: dutasteride (AVODART) 0.5 MG capsule Take 0.5 mg by mouth daily. Take Monday, Wednesday, and friday         Review of Systems  HENT: Negative.   Eyes: Negative.   Respiratory: Negative.   Cardiovascular: Negative.   Gastrointestinal: Negative.   Musculoskeletal: Negative.   Skin: Negative.   Neurological: Negative.   Hematological: Negative.     Psychiatric/Behavioral: Negative.   All other systems reviewed and are negative.    BP 108/60  Pulse 50  Ht 5\' 9"  (1.753 m)  Wt 175 lb 4 oz (79.493 kg)  BMI 25.88 kg/m2  Physical Exam  Nursing note and vitals reviewed. Constitutional: He is oriented to person, place, and time. He appears well-developed and well-nourished.  HENT:  Head: Normocephalic.  Nose: Nose normal.  Mouth/Throat: Oropharynx is clear and moist.  Eyes: Conjunctivae normal are normal. Pupils are equal, round, and reactive to light.  Neck: Normal range of motion. Neck supple. No JVD present.  Cardiovascular: Normal rate, regular rhythm, S1 normal, S2 normal, normal heart sounds and intact distal pulses.  Exam reveals no gallop and no friction rub.   No murmur heard. Pulmonary/Chest: Effort normal and breath sounds normal. No respiratory distress. He has no wheezes. He has no rales. He exhibits no tenderness.  Abdominal: Soft. Bowel sounds are normal. He exhibits no distension. There is no tenderness.  Musculoskeletal: Normal range of motion. He exhibits no edema and no tenderness.  Lymphadenopathy:    He has no cervical adenopathy.  Neurological: He is alert and oriented to person, place, and time. Coordination normal.  Skin: Skin is warm and dry. No rash noted.  No erythema.  Psychiatric: He has a normal mood and affect. His behavior is normal. Judgment and thought content normal.           Assessment and Plan

## 2012-05-11 NOTE — Assessment & Plan Note (Signed)
Cholesterol is at goal on the current lipid regimen. No changes to the medications were made.  

## 2012-05-12 ENCOUNTER — Telehealth: Payer: Self-pay

## 2012-05-12 NOTE — Telephone Encounter (Signed)
Okay to stop aspirin for 5 days if Dr. Bluford Kaufmann requires this Acceptable risk for procedure

## 2012-05-12 NOTE — Telephone Encounter (Signed)
Patient needs a cardiac clearance for colonoscopy with Dr. Bluford Kaufmann. The patient will also need to be off the aspirin 5 days prior to the colonoscopy. Please advise if cleared. Please fax a clearance letter and records to Att: Lanny Hurst, RN at [phone].

## 2012-05-13 NOTE — Telephone Encounter (Signed)
Clearance letter faxed to Dr. Bluford Kaufmann at [facility].

## 2012-06-05 ENCOUNTER — Ambulatory Visit: Payer: Self-pay | Admitting: Internal Medicine

## 2012-06-26 LAB — CBC CANCER CENTER
Basophil #: 0 x10 3/mm (ref 0.0–0.1)
Eosinophil %: 7.9 %
HGB: 10.9 g/dL — ABNORMAL LOW (ref 13.0–18.0)
Monocyte %: 8.7 %
Neutrophil %: 64.5 %
Platelet: 161 x10 3/mm (ref 150–440)
RBC: 3.21 10*6/uL — ABNORMAL LOW (ref 4.40–5.90)
WBC: 5.9 x10 3/mm (ref 3.8–10.6)

## 2012-06-26 LAB — CREATININE, SERUM
Creatinine: 1.36 mg/dL — ABNORMAL HIGH (ref 0.60–1.30)
EGFR (African American): 58 — ABNORMAL LOW
EGFR (Non-African Amer.): 50 — ABNORMAL LOW

## 2012-06-29 ENCOUNTER — Other Ambulatory Visit: Payer: Self-pay | Admitting: Cardiovascular Disease

## 2012-06-29 NOTE — Telephone Encounter (Signed)
Refill sent for pindolol. 

## 2012-07-07 ENCOUNTER — Ambulatory Visit: Payer: Self-pay | Admitting: Specialist

## 2012-07-08 ENCOUNTER — Ambulatory Visit: Payer: Self-pay | Admitting: Internal Medicine

## 2012-07-22 ENCOUNTER — Telehealth: Payer: Self-pay | Admitting: Cardiovascular Disease

## 2012-07-22 NOTE — Telephone Encounter (Signed)
See note

## 2012-07-22 NOTE — Telephone Encounter (Signed)
Difficult to evaluate the echocardiogram as we do not have images Only thing  to go on is someone else's conclusions. Would recommend he call our office if he has any worsening shortness of breath as we would give him a diuretic to take as needed. By report, right ventricular systolic pressures were elevated which can cause shortness breath and sometimes edema.

## 2012-07-22 NOTE — Telephone Encounter (Signed)
Pt called wanting Dr Mariah Milling to review echo he had done at [facility]. I have scanned it into pt chart

## 2012-07-23 NOTE — Telephone Encounter (Signed)
Pt informed. He says he is having no symptoms of sob or edema He says he feels "ok" I encouraged him to report any symptoms he may develop Understanding verb

## 2012-08-14 ENCOUNTER — Ambulatory Visit: Payer: Self-pay | Admitting: Internal Medicine

## 2012-08-14 LAB — CBC CANCER CENTER
Basophil #: 0 x10 3/mm (ref 0.0–0.1)
Basophil %: 0.7 %
Eosinophil #: 0.3 x10 3/mm (ref 0.0–0.7)
Eosinophil %: 6.3 %
HGB: 10.4 g/dL — ABNORMAL LOW (ref 13.0–18.0)
Lymphocyte %: 20.7 %
MCHC: 33.6 g/dL (ref 32.0–36.0)
Monocyte %: 7.3 %
RBC: 2.96 10*6/uL — ABNORMAL LOW (ref 4.40–5.90)

## 2012-08-14 LAB — CREATININE, SERUM
EGFR (African American): 57 — ABNORMAL LOW
EGFR (Non-African Amer.): 49 — ABNORMAL LOW

## 2012-08-14 LAB — LACTATE DEHYDROGENASE: LDH: 214 U/L (ref 85–241)

## 2012-09-05 ENCOUNTER — Ambulatory Visit: Payer: Self-pay | Admitting: Internal Medicine

## 2012-09-09 ENCOUNTER — Telehealth: Payer: Self-pay | Admitting: *Deleted

## 2012-09-09 NOTE — Telephone Encounter (Signed)
pt needs clearance for colonoscopy and upper endo. Pt had clearance in [place] but had to hold off. Procedure on 2/20. Needs to hold ASA 5 days fax [phone]

## 2012-09-09 NOTE — Telephone Encounter (Signed)
Clearance letter faxed to # provided

## 2012-09-25 LAB — CBC CANCER CENTER
Basophil %: 0.5 %
Eosinophil #: 0.4 x10 3/mm (ref 0.0–0.7)
Eosinophil %: 8.6 %
HCT: 30.4 % — ABNORMAL LOW (ref 40.0–52.0)
HGB: 10.2 g/dL — ABNORMAL LOW (ref 13.0–18.0)
Lymphocyte #: 1.1 x10 3/mm (ref 1.0–3.6)
MCH: 35.9 pg — ABNORMAL HIGH (ref 26.0–34.0)
MCHC: 33.6 g/dL (ref 32.0–36.0)
MCV: 107 fL — ABNORMAL HIGH (ref 80–100)
Monocyte #: 0.2 x10 3/mm (ref 0.2–1.0)
Monocyte %: 5.4 %
Neutrophil %: 61 %
Platelet: 151 x10 3/mm (ref 150–440)
RBC: 2.85 10*6/uL — ABNORMAL LOW (ref 4.40–5.90)
RDW: 15.1 % — ABNORMAL HIGH (ref 11.5–14.5)

## 2012-09-25 LAB — IRON AND TIBC: Iron Bind.Cap.(Total): 237 ug/dL — ABNORMAL LOW (ref 250–450)

## 2012-09-25 LAB — HEPATIC FUNCTION PANEL A (ARMC)
Alkaline Phosphatase: 74 U/L (ref 50–136)
Bilirubin, Direct: 0.3 mg/dL — ABNORMAL HIGH (ref 0.00–0.20)
Bilirubin,Total: 1.5 mg/dL — ABNORMAL HIGH (ref 0.2–1.0)
Total Protein: 6.7 g/dL (ref 6.4–8.2)

## 2012-09-25 LAB — CREATININE, SERUM
Creatinine: 1.56 mg/dL — ABNORMAL HIGH (ref 0.60–1.30)
EGFR (African American): 49 — ABNORMAL LOW
EGFR (Non-African Amer.): 42 — ABNORMAL LOW

## 2012-09-30 ENCOUNTER — Other Ambulatory Visit: Payer: Self-pay | Admitting: *Deleted

## 2012-09-30 MED ORDER — PINDOLOL 5 MG PO TABS: ORAL_TABLET | ORAL | Status: DC

## 2012-09-30 NOTE — Telephone Encounter (Signed)
Refilled Pindolol sent to [name] pharmacy.

## 2012-10-03 ENCOUNTER — Ambulatory Visit: Payer: Self-pay | Admitting: Internal Medicine

## 2012-10-05 ENCOUNTER — Other Ambulatory Visit: Payer: Self-pay | Admitting: *Deleted

## 2012-10-05 MED ORDER — PINDOLOL 5 MG PO TABS: ORAL_TABLET | ORAL | Status: DC

## 2012-10-05 NOTE — Telephone Encounter (Signed)
Refilled Pindolol # 90 Refill# 3 sent to [facility] pharmacy.

## 2012-11-03 ENCOUNTER — Ambulatory Visit: Payer: Self-pay | Admitting: Internal Medicine

## 2012-11-06 LAB — CBC CANCER CENTER
Eosinophil #: 0.3 x10 3/mm (ref 0.0–0.7)
Eosinophil %: 8.7 %
Lymphocyte #: 0.8 x10 3/mm — ABNORMAL LOW (ref 1.0–3.6)
MCV: 110 fL — ABNORMAL HIGH (ref 80–100)
Monocyte #: 0.2 x10 3/mm (ref 0.2–1.0)
Monocyte %: 5.1 %
Neutrophil #: 2 x10 3/mm (ref 1.4–6.5)
Neutrophil %: 60.5 %
Platelet: 138 x10 3/mm — ABNORMAL LOW (ref 150–440)
RBC: 2.77 10*6/uL — ABNORMAL LOW (ref 4.40–5.90)
RDW: 14.8 % — ABNORMAL HIGH (ref 11.5–14.5)
WBC: 3.3 x10 3/mm — ABNORMAL LOW (ref 3.8–10.6)

## 2012-11-06 LAB — CREATININE, SERUM
Creatinine: 1.61 mg/dL — ABNORMAL HIGH (ref 0.60–1.30)
EGFR (African American): 47 — ABNORMAL LOW
EGFR (Non-African Amer.): 40 — ABNORMAL LOW

## 2012-11-06 LAB — LACTATE DEHYDROGENASE: LDH: 249 U/L — ABNORMAL HIGH (ref 85–241)

## 2012-11-09 ENCOUNTER — Encounter: Payer: Self-pay | Admitting: Cardiovascular Disease

## 2012-11-09 ENCOUNTER — Ambulatory Visit (INDEPENDENT_AMBULATORY_CARE_PROVIDER_SITE_OTHER): Payer: Medicare PPO | Admitting: Cardiovascular Disease

## 2012-11-09 VITALS — BP 128/66 | HR 54 | Ht 68.0 in | Wt 147.0 lb

## 2012-11-09 DIAGNOSIS — I10 Essential (primary) hypertension: Secondary | ICD-10-CM

## 2012-11-09 DIAGNOSIS — E785 Hyperlipidemia, unspecified: Secondary | ICD-10-CM

## 2012-11-09 DIAGNOSIS — R001 Bradycardia, unspecified: Secondary | ICD-10-CM

## 2012-11-09 DIAGNOSIS — Z951 Presence of aortocoronary bypass graft: Secondary | ICD-10-CM

## 2012-11-09 DIAGNOSIS — I498 Other specified cardiac arrhythmias: Secondary | ICD-10-CM

## 2012-11-09 MED ORDER — ATORVASTATIN CALCIUM 40 MG PO TABS: 40.0000 mg | ORAL_TABLET | Freq: Every day | ORAL | Status: DC

## 2012-11-09 NOTE — Assessment & Plan Note (Signed)
Cholesterol is at goal on the current lipid regimen. No changes to the medications were made.  

## 2012-11-09 NOTE — Progress Notes (Signed)
Patient ID: Frank Mcdaniel, male    DOB: [birth year], 79 y.o.   MRN: [id number]  HPI Comments: 79 year old gentleman with a history of coronary artery disease, bypass in July 2000, hairy cell leukemia, followed by Dr. Lorre Nick,  history of hyperlipidemia.who presents for routine followup.   He reports that he is doing well. He denies any shortness of breath, chest pain. He is active. It does not seem he has significant problems from bradycardia. 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.  Previous echocardiogram March 2007 was essentially normal  Last stress test March 2010 was a lexiscan, ejection fraction 65%, no ischemia noted, no EKG changes  EKG shows sinus bradycardia with rate 54 beats per minute, right bundle branch block     Outpatient Encounter Prescriptions as of 11/09/2012  Medication Sig Dispense Refill  . acyclovir (ZOVIRAX) 400 MG tablet Take 400 mg by mouth 2 (two) times daily.        [name] aspirin 81 MG EC tablet Take 2 tablets (162 mg total) by mouth daily.  30 tablet  0  . atorvastatin (LIPITOR) 40 MG tablet Take 1 tablet (40 mg total) by mouth daily.  90 tablet  3  . dapsone 100 MG tablet Take 100 mg by mouth daily.        . Multiple Vitamin (MULTIVITAMIN) tablet Take 1 tablet by mouth daily.        [name] PARoxetine (PAXIL) 10 MG tablet Take 10 mg by mouth every morning.        . pindolol (VISKEN) 5 MG tablet TAKE 1 TABLET DAILY  90 tablet  3  . Tamsulosin HCl (FLOMAX) 0.4 MG CAPS Take 0.4 mg by mouth daily.        . [DISCONTINUED] atorvastatin (LIPITOR) 40 MG tablet Take 20 mg by mouth daily.        No facility-administered encounter medications on file as of 11/09/2012.    Review of Systems  Constitutional: Negative.   HENT: Negative.   Eyes: Negative.   Respiratory: Negative.   Cardiovascular: Negative.   Gastrointestinal: Negative.   Musculoskeletal: Negative.   Skin: Negative.    Neurological: Negative.   Psychiatric/Behavioral: Negative.   All other systems reviewed and are negative.   BP 128/66  Pulse 54  Ht 5\' 8"  (1.727 m)  Wt 147 lb (66.679 kg)  BMI 22.36 kg/m2  Physical Exam  Nursing note and vitals reviewed. Constitutional: He is oriented to person, place, and time. He appears well-developed and well-nourished.  HENT:  Head: Normocephalic.  Nose: Nose normal.  Mouth/Throat: Oropharynx is clear and moist.  Eyes: Conjunctivae are normal. Pupils are equal, round, and reactive to light.  Neck: Normal range of motion. Neck supple. No JVD present.  Cardiovascular: Normal rate, regular rhythm, S1 normal, S2 normal, normal heart sounds and intact distal pulses.  Exam reveals no gallop and no friction rub.   No murmur heard. Pulmonary/Chest: Effort normal and breath sounds normal. No respiratory distress. He has no wheezes. He has no rales. He exhibits no tenderness.  Abdominal: Soft. Bowel sounds are normal. He exhibits no distension. There is no tenderness.  Musculoskeletal: Normal range of motion. He exhibits no edema and no tenderness.  Lymphadenopathy:    He has no cervical adenopathy.  Neurological: He is alert and oriented to person, place, and time. Coordination normal.  Skin: Skin is warm and dry. No rash noted. No erythema.  Psychiatric: He  has a normal mood and affect. His behavior is normal. Judgment and thought content normal.      Assessment and Plan

## 2012-11-09 NOTE — Assessment & Plan Note (Signed)
Currently with no symptoms of angina. No further workup at this time. Continue current medication regimen. We did offer stress testing. As he feels so well, he does not want any testing at this time.

## 2012-11-09 NOTE — Patient Instructions (Addendum)
You are doing well. No medication changes were made.  Please call us if you have new issues that need to be addressed before your next appt.  Your physician wants you to follow-up in: 6 months.  You will receive a reminder letter in the mail two months in advance. If you don't receive a letter, please call our office to schedule the follow-up appointment.   

## 2012-11-09 NOTE — Assessment & Plan Note (Signed)
Asymptomatic from his bradycardia. No changes made to his medications.

## 2012-11-09 NOTE — Assessment & Plan Note (Signed)
Blood pressure is well controlled on today's visit. No changes made to the medications. 

## 2012-11-10 ENCOUNTER — Encounter: Payer: Self-pay | Admitting: Cardiovascular Disease

## 2012-11-16 LAB — CBC CANCER CENTER
Basophil #: 0 x10 3/mm (ref 0.0–0.1)
Basophil %: 0.7 %
Eosinophil #: 0.3 x10 3/mm (ref 0.0–0.7)
Eosinophil %: 6.7 %
HCT: 29.1 % — ABNORMAL LOW (ref 40.0–52.0)
HGB: 9.7 g/dL — ABNORMAL LOW (ref 13.0–18.0)
Lymphocyte %: 31.1 %
MCH: 36.4 pg — ABNORMAL HIGH (ref 26.0–34.0)
Monocyte #: 0.2 x10 3/mm (ref 0.2–1.0)
Neutrophil #: 2.2 x10 3/mm (ref 1.4–6.5)
Neutrophil %: 56.3 %
Platelet: 132 x10 3/mm — ABNORMAL LOW (ref 150–440)
RDW: 14.6 % — ABNORMAL HIGH (ref 11.5–14.5)
WBC: 4 x10 3/mm (ref 3.8–10.6)

## 2012-11-16 LAB — HEPATIC FUNCTION PANEL A (ARMC)
Albumin: 3.8 g/dL (ref 3.4–5.0)
Bilirubin,Total: 1.3 mg/dL — ABNORMAL HIGH (ref 0.2–1.0)
SGOT(AST): 26 U/L (ref 15–37)
SGPT (ALT): 24 U/L (ref 12–78)

## 2012-11-30 LAB — CBC CANCER CENTER
Basophil #: 0 x10 3/mm (ref 0.0–0.1)
Basophil %: 0.3 %
Eosinophil #: 0.2 x10 3/mm (ref 0.0–0.7)
Eosinophil %: 7.3 %
HCT: 30 % — ABNORMAL LOW (ref 40.0–52.0)
HGB: 10 g/dL — ABNORMAL LOW (ref 13.0–18.0)
Lymphocyte %: 28 %
Lymphs Abs: 0.9 x10 3/mm — ABNORMAL LOW (ref 1.0–3.6)
MCH: 36.4 pg — ABNORMAL HIGH (ref 26.0–34.0)
MCHC: 33.5 g/dL (ref 32.0–36.0)
MCV: 109 fL — ABNORMAL HIGH (ref 80–100)
Monocyte #: 0.2 x10 3/mm (ref 0.2–1.0)
Monocyte %: 5.3 %
Neutrophil #: 1.9 x10 3/mm (ref 1.4–6.5)
Neutrophil %: 59.1 %
Platelet: 134 x10 3/mm — ABNORMAL LOW (ref 150–440)
RBC: 2.76 x10 6/mm — ABNORMAL LOW (ref 4.40–5.90)
RDW: 14.5 % (ref 11.5–14.5)
WBC: 3.2 x10 3/mm — ABNORMAL LOW (ref 3.8–10.6)

## 2012-11-30 LAB — BILIRUBIN, TOTAL: Bilirubin,Total: 1.2 mg/dL — ABNORMAL HIGH (ref 0.2–1.0)

## 2012-11-30 LAB — LACTATE DEHYDROGENASE: LDH: 215 U/L (ref 85–241)

## 2012-12-01 LAB — KAPPA/LAMBDA FREE LIGHT CHAINS (ARMC)

## 2012-12-03 ENCOUNTER — Ambulatory Visit: Payer: Self-pay | Admitting: Internal Medicine

## 2012-12-14 LAB — RETICULOCYTES
Absolute Retic Count: 0.1158 10*6/uL
Reticulocyte: 4.25 % — ABNORMAL HIGH

## 2012-12-14 LAB — CBC CANCER CENTER
Basophil #: 0 x10 3/mm (ref 0.0–0.1)
Eosinophil #: 0.3 x10 3/mm (ref 0.0–0.7)
HGB: 9.9 g/dL — ABNORMAL LOW (ref 13.0–18.0)
Lymphocyte #: 1 x10 3/mm (ref 1.0–3.6)
Lymphocyte %: 27.3 %
MCHC: 33.5 g/dL (ref 32.0–36.0)
MCV: 109 fL — ABNORMAL HIGH (ref 80–100)
Monocyte %: 4.4 %
Platelet: 134 x10 3/mm — ABNORMAL LOW (ref 150–440)
RBC: 2.73 10*6/uL — ABNORMAL LOW (ref 4.40–5.90)
RDW: 14.7 % — ABNORMAL HIGH (ref 11.5–14.5)

## 2012-12-29 LAB — CBC CANCER CENTER
Basophil #: 0 x10 3/mm (ref 0.0–0.1)
Eosinophil #: 0.2 x10 3/mm (ref 0.0–0.7)
Eosinophil %: 8.1 %
HCT: 28.5 % — ABNORMAL LOW (ref 40.0–52.0)
Lymphocyte %: 26.4 %
MCH: 37.2 pg — ABNORMAL HIGH (ref 26.0–34.0)
MCHC: 34 g/dL (ref 32.0–36.0)
Neutrophil #: 1.8 x10 3/mm (ref 1.4–6.5)
Neutrophil %: 60.8 %
RBC: 2.6 10*6/uL — ABNORMAL LOW (ref 4.40–5.90)
RDW: 14.7 % — ABNORMAL HIGH (ref 11.5–14.5)
WBC: 3 x10 3/mm — ABNORMAL LOW (ref 3.8–10.6)

## 2013-01-03 ENCOUNTER — Ambulatory Visit: Payer: Self-pay | Admitting: Internal Medicine

## 2013-01-11 LAB — CBC CANCER CENTER
Basophil #: 0 x10 3/mm (ref 0.0–0.1)
Basophil %: 0.5 %
HGB: 9.5 g/dL — ABNORMAL LOW (ref 13.0–18.0)
Lymphocyte #: 0.8 x10 3/mm — ABNORMAL LOW (ref 1.0–3.6)
Lymphocyte %: 24.5 %
MCV: 112 fL — ABNORMAL HIGH (ref 80–100)
Neutrophil #: 2.1 x10 3/mm (ref 1.4–6.5)
Platelet: 134 x10 3/mm — ABNORMAL LOW (ref 150–440)
RBC: 2.47 10*6/uL — ABNORMAL LOW (ref 4.40–5.90)
RDW: 15.1 % — ABNORMAL HIGH (ref 11.5–14.5)

## 2013-01-11 LAB — CREATININE, SERUM
Creatinine: 1.57 mg/dL — ABNORMAL HIGH (ref 0.60–1.30)
EGFR (Non-African Amer.): 42 — ABNORMAL LOW

## 2013-01-11 LAB — LACTATE DEHYDROGENASE: LDH: 243 U/L — ABNORMAL HIGH (ref 85–241)

## 2013-01-29 LAB — CBC CANCER CENTER
Basophil #: 0 x10 3/mm (ref 0.0–0.1)
Basophil %: 0.2 %
Eosinophil #: 0.1 x10 3/mm (ref 0.0–0.7)
MCH: 37.1 pg — ABNORMAL HIGH (ref 26.0–34.0)
MCV: 110 fL — ABNORMAL HIGH (ref 80–100)
Neutrophil #: 4.9 x10 3/mm (ref 1.4–6.5)
Neutrophil %: 80.5 %
Platelet: 149 x10 3/mm — ABNORMAL LOW (ref 150–440)
RBC: 2.35 10*6/uL — ABNORMAL LOW (ref 4.40–5.90)
RDW: 14.9 % — ABNORMAL HIGH (ref 11.5–14.5)
WBC: 6.1 x10 3/mm (ref 3.8–10.6)

## 2013-02-02 ENCOUNTER — Ambulatory Visit: Payer: Self-pay | Admitting: Internal Medicine

## 2013-02-02 LAB — RETICULOCYTES: Reticulocyte: 4.3 % — ABNORMAL HIGH

## 2013-02-02 LAB — IRON AND TIBC
Iron: 95 ug/dL (ref 65–175)
Unbound Iron-Bind.Cap.: 104 ug/dL

## 2013-02-02 LAB — CANCER CENTER HEMOGLOBIN: HGB: 8.1 g/dL — ABNORMAL LOW (ref 13.0–18.0)

## 2013-02-02 LAB — CREATININE, SERUM: EGFR (Non-African Amer.): 38 — ABNORMAL LOW

## 2013-02-19 LAB — CBC CANCER CENTER
Basophil %: 0.2 %
HCT: 28.1 % — ABNORMAL LOW (ref 40.0–52.0)
HGB: 9.6 g/dL — ABNORMAL LOW (ref 13.0–18.0)
Lymphocyte %: 21.1 %
MCH: 37.4 pg — ABNORMAL HIGH (ref 26.0–34.0)
MCV: 109 fL — ABNORMAL HIGH (ref 80–100)
Monocyte #: 0.1 x10 3/mm — ABNORMAL LOW (ref 0.2–1.0)
Neutrophil #: 2.1 x10 3/mm (ref 1.4–6.5)
Neutrophil %: 72.6 %
Platelet: 105 x10 3/mm — ABNORMAL LOW (ref 150–440)
RBC: 2.57 10*6/uL — ABNORMAL LOW (ref 4.40–5.90)
WBC: 2.9 x10 3/mm — ABNORMAL LOW (ref 3.8–10.6)

## 2013-02-19 LAB — CREATININE, SERUM
Creatinine: 1.56 mg/dL — ABNORMAL HIGH (ref 0.60–1.30)
EGFR (African American): 49 — ABNORMAL LOW

## 2013-02-19 LAB — CLOSTRIDIUM DIFFICILE BY PCR

## 2013-02-19 LAB — LACTATE DEHYDROGENASE: LDH: 245 U/L — ABNORMAL HIGH (ref 85–241)

## 2013-02-19 LAB — POTASSIUM: Potassium: 4.4 mmol/L (ref 3.5–5.1)

## 2013-02-19 LAB — MAGNESIUM: Magnesium: 1.8 mg/dL

## 2013-03-05 ENCOUNTER — Ambulatory Visit: Payer: Self-pay | Admitting: Internal Medicine

## 2013-03-05 LAB — CBC CANCER CENTER
Basophil %: 0.3 %
Eosinophil #: 0.2 x10 3/mm (ref 0.0–0.7)
HCT: 30.3 % — ABNORMAL LOW (ref 40.0–52.0)
Lymphocyte #: 0.7 x10 3/mm — ABNORMAL LOW (ref 1.0–3.6)
Lymphocyte %: 28.4 %
MCHC: 34.3 g/dL (ref 32.0–36.0)
MCV: 108 fL — ABNORMAL HIGH (ref 80–100)
Monocyte #: 0.1 x10 3/mm — ABNORMAL LOW (ref 0.2–1.0)
RBC: 2.81 10*6/uL — ABNORMAL LOW (ref 4.40–5.90)
RDW: 16.8 % — ABNORMAL HIGH (ref 11.5–14.5)
WBC: 2.6 x10 3/mm — ABNORMAL LOW (ref 3.8–10.6)

## 2013-03-22 LAB — CBC CANCER CENTER
Basophil %: 0.6 %
Eosinophil %: 8.5 %
HCT: 25.8 % — ABNORMAL LOW (ref 40.0–52.0)
MCH: 38.4 pg — ABNORMAL HIGH (ref 26.0–34.0)
MCV: 110 fL — ABNORMAL HIGH (ref 80–100)
Monocyte #: 0.1 x10 3/mm — ABNORMAL LOW (ref 0.2–1.0)
Neutrophil #: 1.3 x10 3/mm — ABNORMAL LOW (ref 1.4–6.5)
Neutrophil %: 58 %
Platelet: 109 x10 3/mm — ABNORMAL LOW (ref 150–440)

## 2013-03-22 LAB — CREATININE, SERUM
Creatinine: 1.46 mg/dL — ABNORMAL HIGH (ref 0.60–1.30)
EGFR (African American): 53 — ABNORMAL LOW
EGFR (Non-African Amer.): 45 — ABNORMAL LOW

## 2013-03-30 LAB — CBC CANCER CENTER
Basophil %: 0.2 %
Eosinophil #: 0.2 x10 3/mm (ref 0.0–0.7)
Eosinophil %: 11.1 %
HGB: 9 g/dL — ABNORMAL LOW (ref 13.0–18.0)
Lymphocyte #: 0.7 x10 3/mm — ABNORMAL LOW (ref 1.0–3.6)
Lymphocyte %: 33.1 %
MCH: 39.3 pg — ABNORMAL HIGH (ref 26.0–34.0)
MCHC: 34.8 g/dL (ref 32.0–36.0)
MCV: 113 fL — ABNORMAL HIGH (ref 80–100)
Monocyte #: 0.1 x10 3/mm — ABNORMAL LOW (ref 0.2–1.0)
Monocyte %: 3.7 %
Neutrophil #: 1.1 x10 3/mm — ABNORMAL LOW (ref 1.4–6.5)
Neutrophil %: 51.9 %
Platelet: 110 x10 3/mm — ABNORMAL LOW (ref 150–440)
RBC: 2.3 10*6/uL — ABNORMAL LOW (ref 4.40–5.90)
RDW: 16.6 % — ABNORMAL HIGH (ref 11.5–14.5)
WBC: 2.1 x10 3/mm — ABNORMAL LOW (ref 3.8–10.6)

## 2013-04-05 ENCOUNTER — Ambulatory Visit: Payer: Self-pay | Admitting: Internal Medicine

## 2013-04-12 LAB — CBC CANCER CENTER
Eosinophil %: 9 %
HGB: 8.8 g/dL — ABNORMAL LOW (ref 13.0–18.0)
Monocyte #: 0.1 x10 3/mm — ABNORMAL LOW (ref 0.2–1.0)
Monocyte %: 3.6 %
Neutrophil #: 1 x10 3/mm — ABNORMAL LOW (ref 1.4–6.5)
Platelet: 102 x10 3/mm — ABNORMAL LOW (ref 150–440)
RBC: 2.25 10*6/uL — ABNORMAL LOW (ref 4.40–5.90)
RDW: 15.9 % — ABNORMAL HIGH (ref 11.5–14.5)
WBC: 2 x10 3/mm — CL (ref 3.8–10.6)

## 2013-04-12 LAB — TSH: Thyroid Stimulating Horm: 1.04 u[IU]/mL

## 2013-04-22 LAB — CBC CANCER CENTER
Basophil %: 0.4 %
Lymphocyte %: 43.7 %
MCH: 38.9 pg — ABNORMAL HIGH (ref 26.0–34.0)
MCV: 115 fL — ABNORMAL HIGH (ref 80–100)
Neutrophil #: 0.9 x10 3/mm — ABNORMAL LOW (ref 1.4–6.5)
Platelet: 99 x10 3/mm — ABNORMAL LOW (ref 150–440)

## 2013-04-30 LAB — CBC CANCER CENTER
Basophil #: 0 x10 3/mm (ref 0.0–0.1)
Basophil %: 0.3 %
HCT: 26 % — ABNORMAL LOW (ref 40.0–52.0)
Lymphocyte %: 45.2 %
MCH: 39 pg — ABNORMAL HIGH (ref 26.0–34.0)
MCV: 116 fL — ABNORMAL HIGH (ref 80–100)
Monocyte #: 0.1 x10 3/mm — ABNORMAL LOW (ref 0.2–1.0)
Monocyte %: 3.4 %
Neutrophil #: 0.9 x10 3/mm — ABNORMAL LOW (ref 1.4–6.5)
Platelet: 90 x10 3/mm — ABNORMAL LOW (ref 150–440)
RBC: 2.24 10*6/uL — ABNORMAL LOW (ref 4.40–5.90)
RDW: 15.9 % — ABNORMAL HIGH (ref 11.5–14.5)
WBC: 1.9 x10 3/mm — CL (ref 3.8–10.6)

## 2013-05-05 ENCOUNTER — Ambulatory Visit: Payer: Self-pay | Admitting: Internal Medicine

## 2013-05-07 LAB — CBC CANCER CENTER
Basophil %: 1 %
Eosinophil #: 0.1 x10 3/mm (ref 0.0–0.7)
Eosinophil %: 8.2 %
HCT: 24 % — ABNORMAL LOW (ref 40.0–52.0)
HGB: 8.1 g/dL — ABNORMAL LOW (ref 13.0–18.0)
MCH: 39.6 pg — ABNORMAL HIGH (ref 26.0–34.0)
MCHC: 33.8 g/dL (ref 32.0–36.0)
MCV: 117 fL — ABNORMAL HIGH (ref 80–100)
Monocyte #: 0 x10 3/mm — ABNORMAL LOW (ref 0.2–1.0)
Monocyte %: 3.4 %
Platelet: 84 x10 3/mm — ABNORMAL LOW (ref 150–440)
RDW: 16.4 % — ABNORMAL HIGH (ref 11.5–14.5)

## 2013-05-12 LAB — CBC CANCER CENTER
Eosinophil #: 0.1 x10 3/mm (ref 0.0–0.7)
Eosinophil %: 9.1 %
MCH: 39.4 pg — ABNORMAL HIGH (ref 26.0–34.0)
MCHC: 33.4 g/dL (ref 32.0–36.0)
Monocyte #: 0 x10 3/mm — ABNORMAL LOW (ref 0.2–1.0)
Monocyte %: 3.5 %
Neutrophil #: 0.6 x10 3/mm — ABNORMAL LOW (ref 1.4–6.5)
Platelet: 80 x10 3/mm — ABNORMAL LOW (ref 150–440)
RBC: 2.02 10*6/uL — ABNORMAL LOW (ref 4.40–5.90)
RDW: 16.5 % — ABNORMAL HIGH (ref 11.5–14.5)
WBC: 1.3 x10 3/mm — CL (ref 3.8–10.6)

## 2013-05-14 ENCOUNTER — Encounter (INDEPENDENT_AMBULATORY_CARE_PROVIDER_SITE_OTHER): Payer: Self-pay

## 2013-05-14 ENCOUNTER — Encounter: Payer: Self-pay | Admitting: Cardiovascular Disease

## 2013-05-14 ENCOUNTER — Ambulatory Visit (INDEPENDENT_AMBULATORY_CARE_PROVIDER_SITE_OTHER): Payer: Medicare PPO | Admitting: Cardiovascular Disease

## 2013-05-14 VITALS — BP 112/72 | HR 52 | Ht 68.0 in | Wt 179.5 lb

## 2013-05-14 DIAGNOSIS — I498 Other specified cardiac arrhythmias: Secondary | ICD-10-CM

## 2013-05-14 DIAGNOSIS — E785 Hyperlipidemia, unspecified: Secondary | ICD-10-CM

## 2013-05-14 DIAGNOSIS — I10 Essential (primary) hypertension: Secondary | ICD-10-CM

## 2013-05-14 DIAGNOSIS — Z951 Presence of aortocoronary bypass graft: Secondary | ICD-10-CM

## 2013-05-14 DIAGNOSIS — R001 Bradycardia, unspecified: Secondary | ICD-10-CM

## 2013-05-14 NOTE — Assessment & Plan Note (Addendum)
Blood pressure is well controlled on today's visit. We will hold his bisoprolol.

## 2013-05-14 NOTE — Assessment & Plan Note (Signed)
We have suggested he hold his beta blocker. Heart rate is low. He does have worsening fatigue, likely from underlying anemia.

## 2013-05-14 NOTE — Assessment & Plan Note (Signed)
Cholesterol is at goal on the current lipid regimen. No changes to the medications were made. He has routine blood work with Dr. Vear Clock.

## 2013-05-14 NOTE — Progress Notes (Signed)
Patient ID: Frank Mcdaniel, male    DOB: 1932/09/25, 79 y.o.   MRN: [id number]  HPI Comments: 79 year old gentleman with a history of coronary artery disease, bypass in July 2000, hairy cell leukemia, followed by Dr. Lorre Nick,  history of hyperlipidemia.who presents for routine followup.  He has chronic lower extremity edema, worse on the right than the left.  He reports that he is doing well. He reports that there is a concern that his hairy cell leukemia has returned. He is scheduled for bone marrow biopsy in one week. He has been more tired, he reports being anemic. In the past he was treated with dapsone and acyclovir. 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  Previous echocardiogram March 2007 was essentially normal Last stress test March 2010 was a lexiscan, ejection fraction 65%, no ischemia noted, no EKG changes  EKG shows sinus bradycardia with rate 52 beats per minute, right bundle branch block     Outpatient Encounter Prescriptions as of 05/14/2013  Medication Sig Dispense Refill  . acyclovir (ZOVIRAX) 400 MG tablet Take 400 mg by mouth 2 (two) times daily.        [name] aspirin 81 MG EC tablet Take 2 tablets (162 mg total) by mouth daily.  30 tablet  0  . atorvastatin (LIPITOR) 40 MG tablet Take 1 tablet (40 mg total) by mouth daily.  90 tablet  3  . dapsone 100 MG tablet Take 100 mg by mouth daily.        . Multiple Vitamin (MULTIVITAMIN) tablet Take 1 tablet by mouth daily.        [name] PARoxetine (PAXIL) 10 MG tablet Take 10 mg by mouth every morning.        . pindolol (VISKEN) 5 MG tablet TAKE 1 TABLET DAILY  90 tablet  3  . Tamsulosin HCl (FLOMAX) 0.4 MG CAPS Take 0.4 mg by mouth daily.         No facility-administered encounter medications on file as of 05/14/2013.    Review of Systems  Constitutional: Negative.   HENT: Negative.   Eyes: Negative.   Respiratory: Negative.   Cardiovascular:  Negative.   Gastrointestinal: Negative.   Endocrine: Negative.   Musculoskeletal: Negative.   Skin: Negative.   Allergic/Immunologic: Negative.   Neurological: Negative.   Hematological: Negative.   Psychiatric/Behavioral: Negative.   All other systems reviewed and are negative.   BP 112/72  Pulse 52  Ht 5\' 8"  (1.727 m)  Wt 179 lb 8 oz (81.421 kg)  BMI 27.3 kg/m2  Physical Exam  Nursing note and vitals reviewed. Constitutional: He is oriented to person, place, and time. He appears well-developed and well-nourished.  HENT:  Head: Normocephalic.  Nose: Nose normal.  Mouth/Throat: Oropharynx is clear and moist.  Eyes: Conjunctivae are normal. Pupils are equal, round, and reactive to light.  Neck: Normal range of motion. Neck supple. No JVD present.  Cardiovascular: Normal rate, regular rhythm, S1 normal, S2 normal, normal heart sounds and intact distal pulses.  Exam reveals no gallop and no friction rub.   No murmur heard. Pulmonary/Chest: Effort normal and breath sounds normal. No respiratory distress. He has no wheezes. He has no rales. He exhibits no tenderness.  Abdominal: Soft. Bowel sounds are normal. He exhibits no distension. There is no tenderness.  Musculoskeletal: Normal range of motion. He exhibits no edema and no tenderness.  Lymphadenopathy:    He has  no cervical adenopathy.  Neurological: He is alert and oriented to person, place, and time. Coordination normal.  Skin: Skin is warm and dry. No rash noted. No erythema.  Psychiatric: He has a normal mood and affect. His behavior is normal. Judgment and thought content normal.      Assessment and Plan

## 2013-05-14 NOTE — Assessment & Plan Note (Signed)
Currently with no symptoms of angina. No further workup at this time. Continue current medication regimen. 

## 2013-05-14 NOTE — Patient Instructions (Signed)
You are doing well.  You heart rate is slow today Please hold the pindolol  Please call us if you have new issues that need to be addressed before your next appt.  Your physician wants you to follow-up in: 6 months.  You will receive a reminder letter in the mail two months in advance. If you don't receive a letter, please call our office to schedule the follow-up appointment.

## 2013-05-20 LAB — CBC CANCER CENTER
Eosinophil: 9 %
HCT: 24.4 % — ABNORMAL LOW (ref 40.0–52.0)
Lymphocytes: 33 %
MCHC: 33.6 g/dL (ref 32.0–36.0)
MCV: 118 fL — ABNORMAL HIGH (ref 80–100)
NRBC/100 WBC: 1 /100
Platelet: 83 x10 3/mm — ABNORMAL LOW (ref 150–440)
RBC: 2.08 10*6/uL — ABNORMAL LOW (ref 4.40–5.90)
Segmented Neutrophils: 51 %
Variant Lymphocyte: 3 %
WBC: 1.5 x10 3/mm — CL (ref 3.8–10.6)

## 2013-05-24 LAB — CBC CANCER CENTER
Basophil #: 0 x10 3/mm (ref 0.0–0.1)
Basophil %: 1.3 %
Eosinophil %: 6.7 %
HCT: 23 % — ABNORMAL LOW (ref 40.0–52.0)
Lymphocyte #: 0.5 x10 3/mm — ABNORMAL LOW (ref 1.0–3.6)
Lymphocyte %: 33.9 %
MCH: 39.3 pg — ABNORMAL HIGH (ref 26.0–34.0)
MCHC: 33.2 g/dL (ref 32.0–36.0)
Neutrophil #: 0.8 x10 3/mm — ABNORMAL LOW (ref 1.4–6.5)
Neutrophil %: 52.7 %
Platelet: 89 x10 3/mm — ABNORMAL LOW (ref 150–440)
RBC: 1.94 10*6/uL — ABNORMAL LOW (ref 4.40–5.90)
RDW: 17 % — ABNORMAL HIGH (ref 11.5–14.5)
WBC: 1.5 x10 3/mm — CL (ref 3.8–10.6)

## 2013-05-31 LAB — CBC CANCER CENTER
Eosinophil %: 7.4 %
Lymphocyte #: 0.5 x10 3/mm — ABNORMAL LOW (ref 1.0–3.6)
Lymphocyte %: 39.6 %
MCH: 39.8 pg — ABNORMAL HIGH (ref 26.0–34.0)
MCHC: 33.3 g/dL (ref 32.0–36.0)
Monocyte #: 0.1 x10 3/mm — ABNORMAL LOW (ref 0.2–1.0)
Monocyte %: 4.3 %
Neutrophil #: 0.6 x10 3/mm — ABNORMAL LOW (ref 1.4–6.5)
Neutrophil %: 47.8 %
Platelet: 78 x10 3/mm — ABNORMAL LOW (ref 150–440)
RBC: 1.92 10*6/uL — ABNORMAL LOW (ref 4.40–5.90)
RDW: 17.6 % — ABNORMAL HIGH (ref 11.5–14.5)
WBC: 1.3 x10 3/mm — CL (ref 3.8–10.6)

## 2013-05-31 LAB — COMPREHENSIVE METABOLIC PANEL
Anion Gap: 5 — ABNORMAL LOW (ref 7–16)
Bilirubin,Total: 1 mg/dL (ref 0.2–1.0)
Calcium, Total: 8 mg/dL — ABNORMAL LOW (ref 8.5–10.1)
Chloride: 108 mmol/L — ABNORMAL HIGH (ref 98–107)
Co2: 27 mmol/L (ref 21–32)
Creatinine: 1.25 mg/dL (ref 0.60–1.30)
EGFR (African American): >60
EGFR (Non-African Amer.): 55 — ABNORMAL LOW
Osmolality: 281 (ref 275–301)
Potassium: 4.5 mmol/L (ref 3.5–5.1)

## 2013-05-31 LAB — LACTATE DEHYDROGENASE: LDH: 341 U/L — ABNORMAL HIGH (ref 85–241)

## 2013-05-31 LAB — URIC ACID: Uric Acid: 5.9 mg/dL (ref 3.5–7.2)

## 2013-06-05 ENCOUNTER — Ambulatory Visit: Payer: Self-pay | Admitting: Internal Medicine

## 2013-06-07 LAB — CBC CANCER CENTER
Basophil %: 0.8 %
Eosinophil #: 0.1 x10 3/mm (ref 0.0–0.7)
HCT: 23.2 % — ABNORMAL LOW (ref 40.0–52.0)
HGB: 7.8 g/dL — ABNORMAL LOW (ref 13.0–18.0)
Lymphocyte %: 34.8 %
MCV: 119 fL — ABNORMAL HIGH (ref 80–100)
Monocyte %: 4 %
Neutrophil %: 52.5 %
WBC: 1.5 x10 3/mm — CL (ref 3.8–10.6)

## 2013-06-07 LAB — COMPREHENSIVE METABOLIC PANEL
Albumin: 3.9 g/dL (ref 3.4–5.0)
Anion Gap: 8 (ref 7–16)
Bilirubin,Total: 0.9 mg/dL (ref 0.2–1.0)
Calcium, Total: 8.6 mg/dL (ref 8.5–10.1)
Chloride: 108 mmol/L — ABNORMAL HIGH (ref 98–107)
Co2: 26 mmol/L (ref 21–32)
EGFR (African American): 59 — ABNORMAL LOW
Glucose: 111 mg/dL — ABNORMAL HIGH (ref 65–99)
SGOT(AST): 31 U/L (ref 15–37)
SGPT (ALT): 19 U/L (ref 12–78)
Sodium: 142 mmol/L (ref 136–145)
Total Protein: 6.7 g/dL (ref 6.4–8.2)

## 2013-06-09 LAB — CREATININE, SERUM: EGFR (African American): 57 — ABNORMAL LOW

## 2013-06-14 LAB — CBC CANCER CENTER
Basophil #: 0 x10 3/mm (ref 0.0–0.1)
Eosinophil #: 0.1 x10 3/mm (ref 0.0–0.7)
HGB: 6.7 g/dL — ABNORMAL LOW (ref 13.0–18.0)
Lymphocyte #: 0.1 x10 3/mm — ABNORMAL LOW (ref 1.0–3.6)
Monocyte #: 0 x10 3/mm — ABNORMAL LOW (ref 0.2–1.0)
Monocyte %: 1.3 %
Neutrophil #: 0.7 x10 3/mm — ABNORMAL LOW (ref 1.4–6.5)
Neutrophil %: 77.6 %
Platelet: 54 x10 3/mm — ABNORMAL LOW (ref 150–440)
WBC: 0.9 x10 3/mm — CL (ref 3.8–10.6)

## 2013-06-16 LAB — CBC CANCER CENTER
Basophil %: 0.3 %
Eosinophil #: 0.2 x10 3/mm (ref 0.0–0.7)
Eosinophil %: 4.5 %
HCT: 23.4 % — ABNORMAL LOW (ref 40.0–52.0)
Lymphocyte #: 0.1 x10 3/mm — ABNORMAL LOW (ref 1.0–3.6)
Lymphocyte %: 4.3 %
MCHC: 32.8 g/dL (ref 32.0–36.0)
MCV: 114 fL — ABNORMAL HIGH (ref 80–100)
Monocyte #: 0.1 x10 3/mm — ABNORMAL LOW (ref 0.2–1.0)
Neutrophil #: 3 x10 3/mm (ref 1.4–6.5)
Neutrophil %: 88.1 %
Platelet: 62 x10 3/mm — ABNORMAL LOW (ref 150–440)
RDW: 23.2 % — ABNORMAL HIGH (ref 11.5–14.5)
WBC: 3.4 x10 3/mm — ABNORMAL LOW (ref 3.8–10.6)

## 2013-06-21 ENCOUNTER — Emergency Department: Payer: Self-pay | Admitting: Emergency Medicine

## 2013-06-21 LAB — CBC CANCER CENTER
Basophil %: 0 %
Eosinophil %: 1 %
Lymphocyte #: 0.3 x10 3/mm — ABNORMAL LOW (ref 1.0–3.6)
MCHC: 32.3 g/dL (ref 32.0–36.0)
MCV: 115 fL — ABNORMAL HIGH (ref 80–100)
Monocyte #: 0.3 x10 3/mm (ref 0.2–1.0)
Monocyte %: 3.4 %
Neutrophil #: 6.8 x10 3/mm — ABNORMAL HIGH (ref 1.4–6.5)
Neutrophil %: 91.7 %
Platelet: 137 x10 3/mm — ABNORMAL LOW (ref 150–440)
WBC: 7.4 x10 3/mm (ref 3.8–10.6)

## 2013-06-21 LAB — CREATININE, SERUM
Creatinine: 1.38 mg/dL — ABNORMAL HIGH (ref 0.60–1.30)
EGFR (African American): 56 — ABNORMAL LOW

## 2013-06-21 LAB — TROPONIN I: Troponin-I: <0.02 ng/mL

## 2013-06-22 LAB — BASIC METABOLIC PANEL
Anion Gap: 9 (ref 7–16)
Calcium, Total: 8.3 mg/dL — ABNORMAL LOW (ref 8.5–10.1)
Osmolality: 275 (ref 275–301)

## 2013-06-22 LAB — CBC CANCER CENTER
Eosinophil #: 0 x10 3/mm (ref 0.0–0.7)
MCH: 37.8 pg — ABNORMAL HIGH (ref 26.0–34.0)
Monocyte #: 0.2 x10 3/mm (ref 0.2–1.0)
Monocyte %: 3.6 %
Neutrophil %: 93.4 %
Platelet: 133 x10 3/mm — ABNORMAL LOW (ref 150–440)
RBC: 1.81 10*6/uL — ABNORMAL LOW (ref 4.40–5.90)
RDW: 21.3 % — ABNORMAL HIGH (ref 11.5–14.5)

## 2013-06-24 LAB — CBC CANCER CENTER
Basophil #: 0 x10 3/mm (ref 0.0–0.1)
Basophil %: 0.2 %
Eosinophil #: 0.1 x10 3/mm (ref 0.0–0.7)
Eosinophil %: 2.1 %
Lymphocyte #: 0.2 x10 3/mm — ABNORMAL LOW (ref 1.0–3.6)
MCHC: 32.7 g/dL (ref 32.0–36.0)
MCV: 109 fL — ABNORMAL HIGH (ref 80–100)
Monocyte #: 0.5 x10 3/mm (ref 0.2–1.0)
Monocyte %: 7.1 %
Neutrophil #: 5.7 x10 3/mm (ref 1.4–6.5)
Platelet: 166 x10 3/mm (ref 150–440)

## 2013-06-24 LAB — CREATININE, SERUM
Creatinine: 1.3 mg/dL (ref 0.60–1.30)
EGFR (Non-African Amer.): 52 — ABNORMAL LOW

## 2013-06-28 LAB — CBC CANCER CENTER
Basophil #: 0 x10 3/mm (ref 0.0–0.1)
Eosinophil #: 0.1 x10 3/mm (ref 0.0–0.7)
Eosinophil %: 2.7 %
HCT: 23.8 % — ABNORMAL LOW (ref 40.0–52.0)
MCH: 35.4 pg — ABNORMAL HIGH (ref 26.0–34.0)
MCHC: 32.1 g/dL (ref 32.0–36.0)
MCV: 110 fL — ABNORMAL HIGH (ref 80–100)
Monocyte #: 0.7 x10 3/mm (ref 0.2–1.0)
Monocyte %: 13.5 %
Neutrophil #: 4.2 x10 3/mm (ref 1.4–6.5)
Platelet: 252 x10 3/mm (ref 150–440)
RBC: 2.16 10*6/uL — ABNORMAL LOW (ref 4.40–5.90)
RDW: 23.1 % — ABNORMAL HIGH (ref 11.5–14.5)
WBC: 5.4 x10 3/mm (ref 3.8–10.6)

## 2013-06-28 LAB — CREATININE, SERUM
EGFR (African American): >60
EGFR (Non-African Amer.): 55 — ABNORMAL LOW

## 2013-07-05 ENCOUNTER — Ambulatory Visit: Payer: Self-pay | Admitting: Internal Medicine

## 2013-07-05 LAB — CBC CANCER CENTER
Basophil #: 0 x10 3/mm (ref 0.0–0.1)
Basophil %: 0.3 %
HCT: 26.8 % — ABNORMAL LOW (ref 40.0–52.0)
HGB: 8.7 g/dL — ABNORMAL LOW (ref 13.0–18.0)
Lymphocyte #: 0.3 x10 3/mm — ABNORMAL LOW (ref 1.0–3.6)
MCHC: 32.6 g/dL (ref 32.0–36.0)
MCV: 111 fL — ABNORMAL HIGH (ref 80–100)
Monocyte #: 0.7 x10 3/mm (ref 0.2–1.0)
Neutrophil %: 74.8 %
Platelet: 275 x10 3/mm (ref 150–440)
RBC: 2.4 10*6/uL — ABNORMAL LOW (ref 4.40–5.90)
WBC: 4.7 x10 3/mm (ref 3.8–10.6)

## 2013-07-19 LAB — CBC CANCER CENTER
Basophil #: 0 x10 3/mm (ref 0.0–0.1)
Eosinophil %: 23.9 %
HGB: 10.2 g/dL — ABNORMAL LOW (ref 13.0–18.0)
Lymphocyte %: 5 %
MCV: 107 fL — ABNORMAL HIGH (ref 80–100)
Monocyte #: 0.8 x10 3/mm (ref 0.2–1.0)
Monocyte %: 11.4 %
Neutrophil %: 59.4 %
RBC: 2.99 10*6/uL — ABNORMAL LOW (ref 4.40–5.90)
WBC: 6.7 x10 3/mm (ref 3.8–10.6)

## 2013-08-05 ENCOUNTER — Ambulatory Visit: Payer: Self-pay | Admitting: Internal Medicine

## 2013-08-16 LAB — CBC CANCER CENTER
Basophil #: 0 x10 3/mm (ref 0.0–0.1)
Basophil %: 0.1 %
EOS PCT: 7.2 %
Eosinophil #: 0.4 x10 3/mm (ref 0.0–0.7)
HCT: 33 % — AB (ref 40.0–52.0)
HGB: 10.6 g/dL — ABNORMAL LOW (ref 13.0–18.0)
LYMPHS ABS: 0.4 x10 3/mm — AB (ref 1.0–3.6)
LYMPHS PCT: 8.1 %
MCH: 31.7 pg (ref 26.0–34.0)
MCHC: 32.1 g/dL (ref 32.0–36.0)
MCV: 99 fL (ref 80–100)
MONO ABS: 0.6 x10 3/mm (ref 0.2–1.0)
MONOS PCT: 11.4 %
NEUTROS PCT: 73.2 %
Neutrophil #: 3.7 x10 3/mm (ref 1.4–6.5)
Platelet: 183 x10 3/mm (ref 150–440)
RBC: 3.34 10*6/uL — AB (ref 4.40–5.90)
RDW: 16.7 % — ABNORMAL HIGH (ref 11.5–14.5)
WBC: 5.1 x10 3/mm (ref 3.8–10.6)

## 2013-09-05 ENCOUNTER — Ambulatory Visit: Payer: Self-pay | Admitting: Internal Medicine

## 2013-09-13 LAB — CBC CANCER CENTER
Basophil #: 0 x10 3/mm (ref 0.0–0.1)
Basophil %: 0.4 %
Eosinophil #: 0.4 x10 3/mm (ref 0.0–0.7)
Eosinophil %: 6.6 %
HCT: 33.4 % — AB (ref 40.0–52.0)
HGB: 10.7 g/dL — ABNORMAL LOW (ref 13.0–18.0)
LYMPHS ABS: 0.4 x10 3/mm — AB (ref 1.0–3.6)
Lymphocyte %: 7.6 %
MCH: 33.1 pg (ref 26.0–34.0)
MCHC: 32.1 g/dL (ref 32.0–36.0)
MCV: 103 fL — ABNORMAL HIGH (ref 80–100)
Monocyte #: 0.7 x10 3/mm (ref 0.2–1.0)
Monocyte %: 12.5 %
NEUTROS ABS: 4 x10 3/mm (ref 1.4–6.5)
Neutrophil %: 72.9 %
PLATELETS: 179 x10 3/mm (ref 150–440)
RBC: 3.24 10*6/uL — ABNORMAL LOW (ref 4.40–5.90)
RDW: 14.4 % (ref 11.5–14.5)
WBC: 5.5 x10 3/mm (ref 3.8–10.6)

## 2013-09-13 LAB — HEPATIC FUNCTION PANEL A (ARMC)
ALBUMIN: 3.8 g/dL (ref 3.4–5.0)
ALT: 16 U/L (ref 12–78)
Alkaline Phosphatase: 69 U/L
BILIRUBIN DIRECT: 0.3 mg/dL — AB (ref 0.00–0.20)
Bilirubin,Total: 1 mg/dL (ref 0.2–1.0)
SGOT(AST): 26 U/L (ref 15–37)
TOTAL PROTEIN: 6.7 g/dL (ref 6.4–8.2)

## 2013-09-13 LAB — CREATININE, SERUM
Creatinine: 1.28 mg/dL (ref 0.60–1.30)
EGFR (Non-African Amer.): 53 — ABNORMAL LOW

## 2013-10-03 ENCOUNTER — Ambulatory Visit: Payer: Self-pay | Admitting: Internal Medicine

## 2013-10-11 LAB — CBC CANCER CENTER
Basophil #: 0 x10 3/mm (ref 0.0–0.1)
Basophil %: 0.3 %
EOS PCT: 8.3 %
Eosinophil #: 0.4 x10 3/mm (ref 0.0–0.7)
HCT: 33.1 % — ABNORMAL LOW (ref 40.0–52.0)
HGB: 10.6 g/dL — ABNORMAL LOW (ref 13.0–18.0)
LYMPHS PCT: 8.9 %
Lymphocyte #: 0.5 x10 3/mm — ABNORMAL LOW (ref 1.0–3.6)
MCH: 32.5 pg (ref 26.0–34.0)
MCHC: 32 g/dL (ref 32.0–36.0)
MCV: 102 fL — AB (ref 80–100)
Monocyte #: 0.6 x10 3/mm (ref 0.2–1.0)
Monocyte %: 12.1 %
Neutrophil #: 3.7 x10 3/mm (ref 1.4–6.5)
Neutrophil %: 70.4 %
Platelet: 181 x10 3/mm (ref 150–440)
RBC: 3.26 10*6/uL — AB (ref 4.40–5.90)
RDW: 14.7 % — AB (ref 11.5–14.5)
WBC: 5.3 x10 3/mm (ref 3.8–10.6)

## 2013-11-03 ENCOUNTER — Ambulatory Visit: Payer: Self-pay | Admitting: Internal Medicine

## 2013-11-08 LAB — CBC CANCER CENTER
BASOS ABS: 0 x10 3/mm (ref 0.0–0.1)
BASOS PCT: 0.1 %
EOS ABS: 0.4 x10 3/mm (ref 0.0–0.7)
EOS PCT: 6.6 %
HCT: 33.4 % — ABNORMAL LOW (ref 40.0–52.0)
HGB: 10.7 g/dL — AB (ref 13.0–18.0)
Lymphocyte #: 0.4 x10 3/mm — ABNORMAL LOW (ref 1.0–3.6)
Lymphocyte %: 7.7 %
MCH: 32.7 pg (ref 26.0–34.0)
MCHC: 32 g/dL (ref 32.0–36.0)
MCV: 102 fL — AB (ref 80–100)
MONO ABS: 0.6 x10 3/mm (ref 0.2–1.0)
Monocyte %: 10.3 %
NEUTROS ABS: 4.2 x10 3/mm (ref 1.4–6.5)
Neutrophil %: 75.3 %
Platelet: 180 x10 3/mm (ref 150–440)
RBC: 3.27 10*6/uL — ABNORMAL LOW (ref 4.40–5.90)
RDW: 14.6 % — ABNORMAL HIGH (ref 11.5–14.5)
WBC: 5.6 x10 3/mm (ref 3.8–10.6)

## 2013-11-11 ENCOUNTER — Encounter: Payer: Self-pay | Admitting: Cardiovascular Disease

## 2013-11-11 ENCOUNTER — Ambulatory Visit (INDEPENDENT_AMBULATORY_CARE_PROVIDER_SITE_OTHER): Payer: Medicare PPO | Admitting: Cardiovascular Disease

## 2013-11-11 VITALS — BP 128/60 | HR 56 | Ht 69.0 in | Wt 176.5 lb

## 2013-11-11 DIAGNOSIS — I10 Essential (primary) hypertension: Secondary | ICD-10-CM

## 2013-11-11 DIAGNOSIS — Z951 Presence of aortocoronary bypass graft: Secondary | ICD-10-CM

## 2013-11-11 DIAGNOSIS — E785 Hyperlipidemia, unspecified: Secondary | ICD-10-CM

## 2013-11-11 NOTE — Progress Notes (Signed)
   Patient ID: Frank Mcdaniel, male    DOB: 1933/04/13, 80 y.o.   MRN: [id number]  HPI Comments: 80 year old gentleman with a history of coronary artery disease, bypass in July 2000, hairy cell leukemia, followed by Dr. Inez Pilgrim,  history of hyperlipidemia.who presents for routine followup.  He has chronic lower extremity edema, worse on the right than the left.  He reports that he is doing well.  He does report having chemotherapy for his hairy cell leukemia last November 2014. Previously treated with dapsone and acyclovir.  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  Previous echocardiogram March 2007 was essentially normal Last stress test March 2010 was a lexiscan, ejection fraction 65%, no ischemia noted, no EKG changes Total cholesterol 118, LDL 56, HDL 54  EKG shows sinus bradycardia with rate 56 beats per minute, right bundle branch block     Outpatient Encounter Prescriptions as of 11/11/2013  Medication Sig  . acyclovir (ZOVIRAX) 400 MG tablet Take 400 mg by mouth 2 (two) times daily.    [name] aspirin 81 MG EC tablet Take 2 tablets (162 mg total) by mouth daily.  [name] atorvastatin (LIPITOR) 40 MG tablet Take 1 tablet (40 mg total) by mouth daily.  . dapsone 100 MG tablet Take 100 mg by mouth daily.    . Multiple Vitamin (MULTIVITAMIN) tablet Take 1 tablet by mouth daily.    . Tamsulosin HCl (FLOMAX) 0.4 MG CAPS Take 0.4 mg by mouth daily.     Review of Systems  Constitutional: Negative.   HENT: Negative.   Eyes: Negative.   Respiratory: Negative.   Cardiovascular: Negative.   Gastrointestinal: Negative.   Endocrine: Negative.   Musculoskeletal: Negative.   Skin: Negative.   Allergic/Immunologic: Negative.   Neurological: Negative.   Hematological: Negative.   Psychiatric/Behavioral: Negative.   All other systems reviewed and are negative.  BP 128/60  Pulse 56  Ht 5\' 9"  (1.753 m)  Wt 176 lb 8 oz (80.06 kg)  BMI 26.05 kg/m2  Physical  Exam  Nursing note and vitals reviewed. Constitutional: He is oriented to person, place, and time. He appears well-developed and well-nourished.  HENT:  Head: Normocephalic.  Nose: Nose normal.  Mouth/Throat: Oropharynx is clear and moist.  Eyes: Conjunctivae are normal. Pupils are equal, round, and reactive to light.  Neck: Normal range of motion. Neck supple. No JVD present.  Cardiovascular: Normal rate, regular rhythm, S1 normal, S2 normal, normal heart sounds and intact distal pulses.  Exam reveals no gallop and no friction rub.   No murmur heard. Pulmonary/Chest: Effort normal and breath sounds normal. No respiratory distress. He has no wheezes. He has no rales. He exhibits no tenderness.  Abdominal: Soft. Bowel sounds are normal. He exhibits no distension. There is no tenderness.  Musculoskeletal: Normal range of motion. He exhibits no edema and no tenderness.  Lymphadenopathy:    He has no cervical adenopathy.  Neurological: He is alert and oriented to person, place, and time. Coordination normal.  Skin: Skin is warm and dry. No rash noted. No erythema.  Psychiatric: He has a normal mood and affect. His behavior is normal. Judgment and thought content normal.      Assessment and Plan

## 2013-11-11 NOTE — Assessment & Plan Note (Signed)
Blood pressure is well controlled on today's visit. No changes made to the medications. 

## 2013-11-11 NOTE — Assessment & Plan Note (Signed)
Cholesterol is at goal on the current lipid regimen. No changes to the medications were made.  

## 2013-11-11 NOTE — Patient Instructions (Signed)
You are doing well. No medication changes were made.  Please call us if you have new issues that need to be addressed before your next appt.  Your physician wants you to follow-up in: 12 months.  You will receive a reminder letter in the mail two months in advance. If you don't receive a letter, please call our office to schedule the follow-up appointment. 

## 2013-11-11 NOTE — Assessment & Plan Note (Signed)
Currently with no symptoms of angina. No further workup at this time. Continue current medication regimen. 

## 2013-11-15 ENCOUNTER — Other Ambulatory Visit: Payer: Self-pay | Admitting: Cardiovascular Disease

## 2013-12-03 ENCOUNTER — Ambulatory Visit: Payer: Self-pay | Admitting: Internal Medicine

## 2013-12-20 LAB — CBC CANCER CENTER
BASOS ABS: 0 x10 3/mm (ref 0.0–0.1)
Basophil %: 0.4 %
EOS PCT: 5.3 %
Eosinophil #: 0.3 x10 3/mm (ref 0.0–0.7)
HCT: 33.1 % — ABNORMAL LOW (ref 40.0–52.0)
HGB: 11.1 g/dL — AB (ref 13.0–18.0)
Lymphocyte #: 0.5 x10 3/mm — ABNORMAL LOW (ref 1.0–3.6)
Lymphocyte %: 9.3 %
MCH: 33.5 pg (ref 26.0–34.0)
MCHC: 33.4 g/dL (ref 32.0–36.0)
MCV: 100 fL (ref 80–100)
MONO ABS: 0.5 x10 3/mm (ref 0.2–1.0)
MONOS PCT: 8.7 %
Neutrophil #: 4.4 x10 3/mm (ref 1.4–6.5)
Neutrophil %: 76.3 %
PLATELETS: 182 x10 3/mm (ref 150–440)
RBC: 3.3 10*6/uL — ABNORMAL LOW (ref 4.40–5.90)
RDW: 14.1 % (ref 11.5–14.5)
WBC: 5.7 x10 3/mm (ref 3.8–10.6)

## 2014-01-03 ENCOUNTER — Ambulatory Visit: Payer: Self-pay | Admitting: Internal Medicine

## 2014-02-11 ENCOUNTER — Ambulatory Visit: Payer: Self-pay | Admitting: Internal Medicine

## 2014-02-11 LAB — CBC CANCER CENTER
BASOS ABS: 0 x10 3/mm (ref 0.0–0.1)
BASOS PCT: 0.4 %
EOS ABS: 0.3 x10 3/mm (ref 0.0–0.7)
Eosinophil %: 5.3 %
HCT: 34.8 % — AB (ref 40.0–52.0)
HGB: 11.5 g/dL — AB (ref 13.0–18.0)
LYMPHS ABS: 0.7 x10 3/mm — AB (ref 1.0–3.6)
Lymphocyte %: 12.4 %
MCH: 34.1 pg — AB (ref 26.0–34.0)
MCHC: 33.1 g/dL (ref 32.0–36.0)
MCV: 103 fL — ABNORMAL HIGH (ref 80–100)
Monocyte #: 0.5 x10 3/mm (ref 0.2–1.0)
Monocyte %: 9.1 %
Neutrophil #: 4.2 x10 3/mm (ref 1.4–6.5)
Neutrophil %: 72.8 %
PLATELETS: 158 x10 3/mm (ref 150–440)
RBC: 3.38 10*6/uL — ABNORMAL LOW (ref 4.40–5.90)
RDW: 13.7 % (ref 11.5–14.5)
WBC: 5.8 x10 3/mm (ref 3.8–10.6)

## 2014-03-05 ENCOUNTER — Ambulatory Visit: Payer: Self-pay | Admitting: Internal Medicine

## 2014-03-09 ENCOUNTER — Emergency Department: Payer: Self-pay | Admitting: Student

## 2014-03-28 ENCOUNTER — Ambulatory Visit: Payer: Self-pay | Admitting: Gastroenterology

## 2014-04-05 ENCOUNTER — Ambulatory Visit: Payer: Self-pay | Admitting: Gastroenterology

## 2014-04-07 LAB — PATHOLOGY REPORT

## 2014-04-08 ENCOUNTER — Ambulatory Visit: Payer: Self-pay | Admitting: Internal Medicine

## 2014-04-08 LAB — CBC CANCER CENTER
Basophil #: 0 x10 3/mm (ref 0.0–0.1)
Basophil %: 0.4 %
EOS ABS: 0.3 x10 3/mm (ref 0.0–0.7)
Eosinophil %: 4.3 %
HCT: 32.8 % — AB (ref 40.0–52.0)
HGB: 10.7 g/dL — AB (ref 13.0–18.0)
Lymphocyte #: 0.8 x10 3/mm — ABNORMAL LOW (ref 1.0–3.6)
Lymphocyte %: 13 %
MCH: 34.2 pg — ABNORMAL HIGH (ref 26.0–34.0)
MCHC: 32.7 g/dL (ref 32.0–36.0)
MCV: 105 fL — AB (ref 80–100)
MONO ABS: 0.6 x10 3/mm (ref 0.2–1.0)
MONOS PCT: 10.8 %
NEUTROS ABS: 4.1 x10 3/mm (ref 1.4–6.5)
NEUTROS PCT: 71.5 %
Platelet: 168 x10 3/mm (ref 150–440)
RBC: 3.14 10*6/uL — ABNORMAL LOW (ref 4.40–5.90)
RDW: 14.5 % (ref 11.5–14.5)
WBC: 5.8 x10 3/mm (ref 3.8–10.6)

## 2014-04-11 LAB — KAPPA/LAMBDA FREE LIGHT CHAINS (ARMC)

## 2014-05-05 ENCOUNTER — Ambulatory Visit: Payer: Self-pay | Admitting: Internal Medicine

## 2014-06-03 LAB — CREATININE, SERUM
Creatinine: 1.39 mg/dL — ABNORMAL HIGH (ref 0.60–1.30)
EGFR (African American): >60
EGFR (Non-African Amer.): 52 — ABNORMAL LOW

## 2014-06-03 LAB — CBC CANCER CENTER
BASOS PCT: 0.3 %
Basophil #: 0 x10 3/mm (ref 0.0–0.1)
EOS PCT: 3.6 %
Eosinophil #: 0.2 x10 3/mm (ref 0.0–0.7)
HCT: 35.2 % — AB (ref 40.0–52.0)
HGB: 11.2 g/dL — ABNORMAL LOW (ref 13.0–18.0)
LYMPHS PCT: 10.2 %
Lymphocyte #: 0.7 x10 3/mm — ABNORMAL LOW (ref 1.0–3.6)
MCH: 34.2 pg — ABNORMAL HIGH (ref 26.0–34.0)
MCHC: 31.8 g/dL — ABNORMAL LOW (ref 32.0–36.0)
MCV: 107 fL — ABNORMAL HIGH (ref 80–100)
MONO ABS: 0.5 x10 3/mm (ref 0.2–1.0)
Monocyte %: 8.3 %
Neutrophil #: 5 x10 3/mm (ref 1.4–6.5)
Neutrophil %: 77.6 %
Platelet: 170 x10 3/mm (ref 150–440)
RBC: 3.28 10*6/uL — AB (ref 4.40–5.90)
RDW: 14.5 % (ref 11.5–14.5)
WBC: 6.5 x10 3/mm (ref 3.8–10.6)

## 2014-06-05 ENCOUNTER — Ambulatory Visit: Payer: Self-pay | Admitting: Internal Medicine

## 2014-07-22 ENCOUNTER — Ambulatory Visit: Payer: Self-pay | Admitting: Internal Medicine

## 2014-07-22 LAB — CBC CANCER CENTER
Basophil #: 0 x10 3/mm (ref 0.0–0.1)
Basophil %: 0.2 %
EOS ABS: 0.2 x10 3/mm (ref 0.0–0.7)
EOS PCT: 2.4 %
HCT: 33.9 % — ABNORMAL LOW (ref 40.0–52.0)
HGB: 10.9 g/dL — AB (ref 13.0–18.0)
LYMPHS PCT: 6.8 %
Lymphocyte #: 0.5 x10 3/mm — ABNORMAL LOW (ref 1.0–3.6)
MCH: 33.9 pg (ref 26.0–34.0)
MCHC: 32.3 g/dL (ref 32.0–36.0)
MCV: 105 fL — ABNORMAL HIGH (ref 80–100)
MONOS PCT: 6.3 %
Monocyte #: 0.5 x10 3/mm (ref 0.2–1.0)
NEUTROS ABS: 6.4 x10 3/mm (ref 1.4–6.5)
Neutrophil %: 84.3 %
Platelet: 172 x10 3/mm (ref 150–440)
RBC: 3.22 10*6/uL — AB (ref 4.40–5.90)
RDW: 14.3 % (ref 11.5–14.5)
WBC: 7.6 x10 3/mm (ref 3.8–10.6)

## 2014-07-22 LAB — CREATININE, SERUM
Creatinine: 1.35 mg/dL — ABNORMAL HIGH (ref 0.60–1.30)
GFR CALC NON AF AMER: 54 — AB

## 2014-08-05 ENCOUNTER — Ambulatory Visit: Payer: Self-pay | Admitting: Internal Medicine

## 2014-08-26 ENCOUNTER — Other Ambulatory Visit: Payer: Self-pay | Admitting: Cardiovascular Disease

## 2014-09-01 ENCOUNTER — Emergency Department: Payer: Self-pay | Admitting: Emergency Medicine

## 2014-09-01 LAB — CBC
HCT: 33.4 % — ABNORMAL LOW (ref 40.0–52.0)
HGB: 10.9 g/dL — ABNORMAL LOW (ref 13.0–18.0)
MCH: 34.4 pg — ABNORMAL HIGH (ref 26.0–34.0)
MCHC: 32.5 g/dL (ref 32.0–36.0)
MCV: 106 fL — ABNORMAL HIGH (ref 80–100)
PLATELETS: 142 10*3/uL — AB (ref 150–440)
RBC: 3.15 10*6/uL — ABNORMAL LOW (ref 4.40–5.90)
RDW: 14.3 % (ref 11.5–14.5)
WBC: 3.5 10*3/uL — AB (ref 3.8–10.6)

## 2014-09-01 LAB — COMPREHENSIVE METABOLIC PANEL
AST: 33 U/L (ref 15–37)
Albumin: 3.9 g/dL (ref 3.4–5.0)
Alkaline Phosphatase: 62 U/L (ref 46–116)
Anion Gap: 5 — ABNORMAL LOW (ref 7–16)
BUN: 18 mg/dL (ref 7–18)
Bilirubin,Total: 0.9 mg/dL (ref 0.2–1.0)
CALCIUM: 8.8 mg/dL (ref 8.5–10.1)
CO2: 26 mmol/L (ref 21–32)
Chloride: 110 mmol/L — ABNORMAL HIGH (ref 98–107)
Creatinine: 1.28 mg/dL (ref 0.60–1.30)
GFR CALC NON AF AMER: 58 — AB
GLUCOSE: 90 mg/dL (ref 65–99)
Osmolality: 283 (ref 275–301)
POTASSIUM: 4 mmol/L (ref 3.5–5.1)
SGPT (ALT): 15 U/L (ref 14–63)
Sodium: 141 mmol/L (ref 136–145)
Total Protein: 7 g/dL (ref 6.4–8.2)

## 2014-09-01 LAB — URINALYSIS, COMPLETE
BILIRUBIN, UR: NEGATIVE
BLOOD: NEGATIVE
Bacteria: NONE SEEN
Glucose,UR: NEGATIVE mg/dL (ref 0–75)
KETONE: NEGATIVE
Leukocyte Esterase: NEGATIVE
Nitrite: NEGATIVE
PH: 5 (ref 4.5–8.0)
Protein: NEGATIVE
SPECIFIC GRAVITY: 1.013 (ref 1.003–1.030)
SQUAMOUS EPITHELIAL: NONE SEEN
WBC UR: <1 /HPF (ref 0–5)

## 2014-09-01 LAB — CK TOTAL AND CKMB (NOT AT ARMC)
CK, Total: 74 U/L (ref 39–308)
CK-MB: 2.5 ng/mL (ref 0.5–3.6)

## 2014-09-01 LAB — TROPONIN I: Troponin-I: <0.02 ng/mL

## 2014-09-02 ENCOUNTER — Encounter: Payer: Self-pay | Admitting: Cardiovascular Disease

## 2014-09-02 ENCOUNTER — Ambulatory Visit (INDEPENDENT_AMBULATORY_CARE_PROVIDER_SITE_OTHER): Payer: Medicare PPO | Admitting: Cardiovascular Disease

## 2014-09-02 VITALS — BP 138/54 | HR 53 | Ht 68.0 in | Wt 166.5 lb

## 2014-09-02 DIAGNOSIS — R079 Chest pain, unspecified: Secondary | ICD-10-CM | POA: Insufficient documentation

## 2014-09-02 DIAGNOSIS — I10 Essential (primary) hypertension: Secondary | ICD-10-CM

## 2014-09-02 DIAGNOSIS — Z951 Presence of aortocoronary bypass graft: Secondary | ICD-10-CM

## 2014-09-02 DIAGNOSIS — E785 Hyperlipidemia, unspecified: Secondary | ICD-10-CM

## 2014-09-02 DIAGNOSIS — R001 Bradycardia, unspecified: Secondary | ICD-10-CM

## 2014-09-02 DIAGNOSIS — R0789 Other chest pain: Secondary | ICD-10-CM

## 2014-09-02 NOTE — Patient Instructions (Signed)
You are doing well. No medication changes were made.  If you have more chest pressure, call the office We would order a stress test Try ibuprofen for the pain as well  Please call us if you have new issues that need to be addressed before your next appt.  Your physician wants you to follow-up in: 6 months.  You will receive a reminder letter in the mail two months in advance. If you don't receive a letter, please call our office to schedule the follow-up appointment.

## 2014-09-02 NOTE — Assessment & Plan Note (Signed)
Atypical chest pain on today's visit. Suspect musculoskeletal. He will try NSAIDs over the weekend. If he has additional symptoms next week, suggested he call for a stress test. Last stress test was 2010

## 2014-09-02 NOTE — Assessment & Plan Note (Signed)
Asymptomatic bradycardia. No further workup at this time

## 2014-09-02 NOTE — Assessment & Plan Note (Signed)
Cholesterol is at goal on the current lipid regimen. No changes to the medications were made.  

## 2014-09-02 NOTE — Assessment & Plan Note (Signed)
Blood pressure is well controlled on today's visit. No changes made to the medications. 

## 2014-09-02 NOTE — Assessment & Plan Note (Signed)
Recently slipped on the ice 2 days ago, landed on the ground, bracing himself with his left arm. Recent chest pain since that time. Suspect musculoskeletal and not ischemia. Again the plan would be if he continues to have symptoms that he will call the office for further workup. Recommended he try NSAIDs. Lab work reviewed from the hospital with him

## 2014-09-02 NOTE — Progress Notes (Signed)
Patient ID: Frank Mcdaniel, male    DOB: 1933-05-11, 81 y.o.   MRN: [id number]  HPI Comments: 81 year old gentleman with a history of coronary artery disease, bypass in July 2000, hairy cell leukemia, followed by Dr. Inez Pilgrim,  history of hyperlipidemia.who presents for routine followup of his coronary artery disease.  He has chronic lower extremity edema, worse on the right than the left.  He reports that he is doing well.  He continues to have follow-up for his hairy cell leukemia. Previously treated with dapsone and acyclovir.  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 He denies any lightheadedness or dizziness.[name] He does report slipping on the ice 2 days ago, possibly landed on his left arm. Yesterday had chest pressure, described as a dullness in his left pectoral area. He went to the emergency room and evaluation showed negative cardiac enzymes, otherwise normal lab work including CBC, basic metabolic panel, EKG unchanged, chest x-ray normal. He was discharged home with follow-up today Possibly had one or 2 very short episodes of fleeting low-grade almost in the left chest. Again he wonders if it could be musculoskeletal from recent fall  EKG on today's visit shows normal sinus rhythm with rate 53 bpm, right bundle branch block, nonspecific ST abnormality. No significant change from prior EKGs  Other past medical history Previous echocardiogram March 2007 was essentially normal Last stress test March 2010 was a lexiscan, ejection fraction 65%, no ischemia noted, no EKG changes Total cholesterol 118, LDL 56, HDL 54    No Known Allergies  Outpatient Encounter Prescriptions as of 09/02/2014  Medication Sig  . acyclovir (ZOVIRAX) 400 MG tablet Take 400 mg by mouth 2 (two) times daily.    [name] aspirin 81 MG EC tablet Take 2 tablets (162 mg total) by mouth daily.  [name] atorvastatin (LIPITOR) 20 MG tablet Take 20 mg by mouth daily.  . dapsone 100 MG tablet  Take 100 mg by mouth daily.    . Multiple Vitamin (MULTIVITAMIN) tablet Take 1 tablet by mouth daily.    [name] omeprazole (PRILOSEC) 40 MG capsule Take 40 mg by mouth daily.  . Tamsulosin HCl (FLOMAX) 0.4 MG CAPS Take 0.4 mg by mouth daily.    . [DISCONTINUED] atorvastatin (LIPITOR) 40 MG tablet TAKE 1 TABLET BY MOUTH EVERY DAY    Past Medical History  Diagnosis Date  . Coronary artery disease   . BPH (benign prostatic hyperplasia)   . Hyperlipidemia   . Leukemia     Past Surgical History  Procedure Laterality Date  . Coronary artery bypass graft  2000  . Cataract extraction      Social History  reports that he has never smoked. He has never used smokeless tobacco. He reports that he does not drink alcohol or use illicit drugs.  Family History Family history is unknown by patient.       Review of Systems  Constitutional: Negative.   Eyes: Negative.   Respiratory: Positive for chest tightness.   Cardiovascular: Negative.   Gastrointestinal: Negative.   Musculoskeletal: Negative.   Neurological: Negative.   Hematological: Negative.   Psychiatric/Behavioral: Negative.   All other systems reviewed and are negative.  BP 138/54 mmHg  Pulse 53  Ht 5\' 8"  (1.727 m)  Wt 166 lb 8 oz (75.524 kg)  BMI 25.32 kg/m2  Physical Exam  Constitutional: He is oriented to person, place, and time. He appears well-developed and well-nourished.  HENT:  Head: Normocephalic.  Nose: Nose  normal.  Mouth/Throat: Oropharynx is clear and moist.  Eyes: Conjunctivae are normal. Pupils are equal, round, and reactive to light.  Neck: Normal range of motion. Neck supple. No JVD present.  Cardiovascular: Normal rate, regular rhythm, S1 normal, S2 normal, normal heart sounds and intact distal pulses.  Exam reveals no gallop and no friction rub.   No murmur heard. Pulmonary/Chest: Effort normal and breath sounds normal. No respiratory distress. He has no wheezes. He has no rales. He exhibits no  tenderness.  Abdominal: Soft. Bowel sounds are normal. He exhibits no distension. There is no tenderness.  Musculoskeletal: Normal range of motion. He exhibits no edema or tenderness.  Lymphadenopathy:    He has no cervical adenopathy.  Neurological: He is alert and oriented to person, place, and time. Coordination normal.  Skin: Skin is warm and dry. No rash noted. No erythema.  Psychiatric: He has a normal mood and affect. His behavior is normal. Judgment and thought content normal.      Assessment and Plan   Nursing note and vitals reviewed.

## 2014-09-16 ENCOUNTER — Ambulatory Visit: Payer: Self-pay | Admitting: Internal Medicine

## 2014-10-04 ENCOUNTER — Ambulatory Visit
Admit: 2014-10-04 | Disposition: A | Discharge status: Home / Self Care | Payer: Self-pay | Attending: Internal Medicine | Admitting: Internal Medicine

## 2014-11-04 ENCOUNTER — Ambulatory Visit
Admit: 2014-11-04 | Disposition: A | Discharge status: Home / Self Care | Payer: Self-pay | Attending: Internal Medicine | Admitting: Internal Medicine

## 2014-11-24 LAB — CBC CANCER CENTER
BASOS ABS: 0 x10 3/mm (ref 0.0–0.1)
Basophil %: 0.2 %
Eosinophil #: 0.1 x10 3/mm (ref 0.0–0.7)
Eosinophil %: 3.4 %
HCT: 28.5 % — ABNORMAL LOW (ref 40.0–52.0)
HGB: 9.5 g/dL — ABNORMAL LOW (ref 13.0–18.0)
Lymphocyte #: 0.6 x10 3/mm — ABNORMAL LOW (ref 1.0–3.6)
Lymphocyte %: 23.5 %
MCH: 36.6 pg — ABNORMAL HIGH (ref 26.0–34.0)
MCHC: 33.3 g/dL (ref 32.0–36.0)
MCV: 110 fL — AB (ref 80–100)
MONOS PCT: 6.6 %
Monocyte #: 0.2 x10 3/mm (ref 0.2–1.0)
Neutrophil #: 1.6 x10 3/mm (ref 1.4–6.5)
Neutrophil %: 66.3 %
PLATELETS: 130 x10 3/mm — AB (ref 150–440)
RBC: 2.59 10*6/uL — AB (ref 4.40–5.90)
RDW: 15.2 % — ABNORMAL HIGH (ref 11.5–14.5)
WBC: 2.4 x10 3/mm — AB (ref 3.8–10.6)

## 2014-11-24 LAB — CREATININE, SERUM
Creatinine: 1.23 mg/dL
EGFR (African American): >60
EGFR (Non-African Amer.): 55 — ABNORMAL LOW

## 2014-11-25 LAB — KAPPA/LAMBDA FREE LIGHT CHAINS (ARMC)

## 2014-12-01 LAB — CBC CANCER CENTER
BASOS ABS: 0 x10 3/mm (ref 0.0–0.1)
BASOS PCT: 0.3 %
EOS ABS: 0.1 x10 3/mm (ref 0.0–0.7)
Eosinophil %: 2.4 %
HCT: 29.5 % — AB (ref 40.0–52.0)
HGB: 9.6 g/dL — AB (ref 13.0–18.0)
Lymphocyte #: 0.7 x10 3/mm — ABNORMAL LOW (ref 1.0–3.6)
Lymphocyte %: 26.6 %
MCH: 36.5 pg — ABNORMAL HIGH (ref 26.0–34.0)
MCHC: 32.7 g/dL (ref 32.0–36.0)
MCV: 112 fL — ABNORMAL HIGH (ref 80–100)
MONOS PCT: 5.5 %
Monocyte #: 0.1 x10 3/mm — ABNORMAL LOW (ref 0.2–1.0)
NEUTROS ABS: 1.7 x10 3/mm (ref 1.4–6.5)
Neutrophil %: 65.2 %
Platelet: 128 x10 3/mm — ABNORMAL LOW (ref 150–440)
RBC: 2.64 10*6/uL — ABNORMAL LOW (ref 4.40–5.90)
RDW: 15.4 % — AB (ref 11.5–14.5)
WBC: 2.6 x10 3/mm — AB (ref 3.8–10.6)

## 2014-12-01 LAB — OCCULT BLOOD X 1 CARD TO LAB, STOOL
OCCULT BLOOD, FECES: NEGATIVE
Occult Blood, Feces: NEGATIVE
Occult Blood, Feces: NEGATIVE

## 2014-12-01 LAB — RETICULOCYTES
Absolute Retic Count: 0.1199 10*6/uL (ref 0.019–0.186)
RETICULOCYTE: 4.55 % — AB (ref 0.4–3.1)

## 2014-12-05 ENCOUNTER — Other Ambulatory Visit: Payer: Self-pay | Admitting: Internal Medicine

## 2014-12-08 ENCOUNTER — Other Ambulatory Visit: Payer: Self-pay | Admitting: *Deleted

## 2014-12-08 NOTE — Telephone Encounter (Signed)
Is now completely out of Acyclovir, states pharmacy sent refill request to [place] Monday. Please send in med

## 2014-12-12 MED ORDER — ACYCLOVIR 400 MG PO TABS: 400.0000 mg | ORAL_TABLET | Freq: Two times a day (BID) | ORAL | Status: DC

## 2014-12-12 NOTE — Addendum Note (Signed)
Addended by: Dallas Schimke on: 12/12/2014 10:48 AM   Modules accepted: Orders

## 2014-12-15 ENCOUNTER — Other Ambulatory Visit: Payer: Self-pay | Admitting: *Deleted

## 2014-12-15 ENCOUNTER — Inpatient Hospital Stay: Payer: Medicare PPO | Attending: Internal Medicine

## 2014-12-15 DIAGNOSIS — C9140 Hairy cell leukemia not having achieved remission: Secondary | ICD-10-CM | POA: Diagnosis not present

## 2014-12-15 LAB — CBC WITH DIFFERENTIAL/PLATELET
Basophils Absolute: 0 10*3/uL (ref 0–0.1)
Basophils Relative: 0 %
EOS ABS: 0.1 10*3/uL (ref 0–0.7)
EOS PCT: 3 %
HCT: 29.7 % — ABNORMAL LOW (ref 40.0–52.0)
Hemoglobin: 9.8 g/dL — ABNORMAL LOW (ref 13.0–18.0)
Lymphocytes Relative: 23 %
Lymphs Abs: 0.5 10*3/uL — ABNORMAL LOW (ref 1.0–3.6)
MCH: 37 pg — ABNORMAL HIGH (ref 26.0–34.0)
MCHC: 32.9 g/dL (ref 32.0–36.0)
MCV: 112.6 fL — ABNORMAL HIGH (ref 80.0–100.0)
Monocytes Absolute: 0.1 10*3/uL — ABNORMAL LOW (ref 0.2–1.0)
Monocytes Relative: 6 %
Neutro Abs: 1.6 10*3/uL (ref 1.4–6.5)
Neutrophils Relative %: 68 %
PLATELETS: 126 10*3/uL — AB (ref 150–440)
RBC: 2.64 MIL/uL — ABNORMAL LOW (ref 4.40–5.90)
RDW: 14.9 % — ABNORMAL HIGH (ref 11.5–14.5)
WBC: 2.3 10*3/uL — AB (ref 3.8–10.6)

## 2014-12-28 ENCOUNTER — Telehealth: Payer: Self-pay | Admitting: *Deleted

## 2014-12-28 ENCOUNTER — Other Ambulatory Visit: Payer: Self-pay | Admitting: *Deleted

## 2014-12-28 DIAGNOSIS — C9140 Hairy cell leukemia not having achieved remission: Secondary | ICD-10-CM

## 2014-12-28 DIAGNOSIS — D649 Anemia, unspecified: Secondary | ICD-10-CM

## 2014-12-28 NOTE — Telephone Encounter (Signed)
Reports he has an appt tomorrow, but he is feeling really weak adn run down and would like to come in for lab and see md today. After discussing with Dr Remi Deter nurse, Arlana Hove, RN patient was advised to keep appt for tomorrow and if he is unable to wait then he can go to PMD or Urgent Care to be seen as Dr Inez Pilgrim is not in office this morning and his afternoon schedule is full. Patietn acknowledged this

## 2014-12-29 ENCOUNTER — Encounter: Payer: Self-pay | Admitting: Oncology

## 2014-12-29 ENCOUNTER — Ambulatory Visit (HOSPITAL_BASED_OUTPATIENT_CLINIC_OR_DEPARTMENT_OTHER): Payer: Medicare PPO | Admitting: Oncology

## 2014-12-29 ENCOUNTER — Ambulatory Visit: Payer: Medicare PPO | Admitting: Internal Medicine

## 2014-12-29 ENCOUNTER — Inpatient Hospital Stay (HOSPITAL_BASED_OUTPATIENT_CLINIC_OR_DEPARTMENT_OTHER): Payer: Medicare PPO

## 2014-12-29 VITALS — BP 168/82 | HR 62 | Temp 98.0°F | Resp 16 | Wt 166.2 lb

## 2014-12-29 DIAGNOSIS — D649 Anemia, unspecified: Secondary | ICD-10-CM

## 2014-12-29 DIAGNOSIS — C9140 Hairy cell leukemia not having achieved remission: Secondary | ICD-10-CM

## 2014-12-29 LAB — CBC WITH DIFFERENTIAL/PLATELET
Basophils Absolute: 0 10*3/uL (ref 0–0.1)
Basophils Relative: 0 %
EOS ABS: 0 10*3/uL (ref 0–0.7)
Eosinophils Relative: 2 %
HCT: 29.4 % — ABNORMAL LOW (ref 40.0–52.0)
HEMOGLOBIN: 9.7 g/dL — AB (ref 13.0–18.0)
Lymphocytes Relative: 17 %
Lymphs Abs: 0.5 10*3/uL — ABNORMAL LOW (ref 1.0–3.6)
MCH: 36.8 pg — AB (ref 26.0–34.0)
MCHC: 33 g/dL (ref 32.0–36.0)
MCV: 111.8 fL — ABNORMAL HIGH (ref 80.0–100.0)
Monocytes Absolute: 0.1 10*3/uL — ABNORMAL LOW (ref 0.2–1.0)
Monocytes Relative: 5 %
NEUTROS ABS: 2.1 10*3/uL (ref 1.4–6.5)
Neutrophils Relative %: 76 %
PLATELETS: 121 10*3/uL — AB (ref 150–440)
RBC: 2.63 MIL/uL — ABNORMAL LOW (ref 4.40–5.90)
RDW: 14.8 % — AB (ref 11.5–14.5)
WBC: 2.8 10*3/uL — AB (ref 3.8–10.6)

## 2014-12-29 LAB — SAMPLE TO BLOOD BANK

## 2015-01-05 ENCOUNTER — Telehealth: Payer: Self-pay | Admitting: Cardiovascular Disease

## 2015-01-05 NOTE — Telephone Encounter (Signed)
Patient had urgent care ekg they will fax over.

## 2015-01-10 ENCOUNTER — Encounter: Payer: Self-pay | Admitting: Emergency Medicine

## 2015-01-10 ENCOUNTER — Other Ambulatory Visit: Payer: Self-pay

## 2015-01-10 ENCOUNTER — Emergency Department
Admission: EM | Admit: 2015-01-10 | Discharge: 2015-01-10 | Disposition: A | Discharge status: Home / Self Care | Payer: Medicare PPO | Attending: Emergency Medicine | Admitting: Emergency Medicine

## 2015-01-10 ENCOUNTER — Emergency Department: Payer: Medicare PPO

## 2015-01-10 DIAGNOSIS — R0789 Other chest pain: Secondary | ICD-10-CM | POA: Insufficient documentation

## 2015-01-10 DIAGNOSIS — I10 Essential (primary) hypertension: Secondary | ICD-10-CM | POA: Insufficient documentation

## 2015-01-10 DIAGNOSIS — Z79899 Other long term (current) drug therapy: Secondary | ICD-10-CM | POA: Diagnosis not present

## 2015-01-10 DIAGNOSIS — Z7982 Long term (current) use of aspirin: Secondary | ICD-10-CM | POA: Diagnosis not present

## 2015-01-10 DIAGNOSIS — R531 Weakness: Secondary | ICD-10-CM | POA: Diagnosis not present

## 2015-01-10 DIAGNOSIS — R079 Chest pain, unspecified: Secondary | ICD-10-CM

## 2015-01-10 LAB — CBC
HCT: 30.7 % — ABNORMAL LOW (ref 40.0–52.0)
Hemoglobin: 10.1 g/dL — ABNORMAL LOW (ref 13.0–18.0)
MCH: 37.2 pg — ABNORMAL HIGH (ref 26.0–34.0)
MCHC: 32.9 g/dL (ref 32.0–36.0)
MCV: 113.2 fL — ABNORMAL HIGH (ref 80.0–100.0)
PLATELETS: 129 10*3/uL — AB (ref 150–440)
RBC: 2.71 MIL/uL — AB (ref 4.40–5.90)
RDW: 15.9 % — AB (ref 11.5–14.5)
WBC: 2.7 10*3/uL — AB (ref 3.8–10.6)

## 2015-01-10 LAB — BASIC METABOLIC PANEL
Anion gap: 10 (ref 5–15)
BUN: 15 mg/dL (ref 6–20)
CHLORIDE: 106 mmol/L (ref 101–111)
CO2: 26 mmol/L (ref 22–32)
Calcium: 9.6 mg/dL (ref 8.9–10.3)
Creatinine, Ser: 1.28 mg/dL — ABNORMAL HIGH (ref 0.61–1.24)
GFR calc non Af Amer: 51 mL/min — ABNORMAL LOW (ref >60)
GFR, EST AFRICAN AMERICAN: 59 mL/min — AB (ref >60)
Glucose, Bld: 94 mg/dL (ref 65–99)
Potassium: 3.2 mmol/L — ABNORMAL LOW (ref 3.5–5.1)
Sodium: 142 mmol/L (ref 135–145)

## 2015-01-10 LAB — TROPONIN I: Troponin I: <0.03 ng/mL (ref <0.031)

## 2015-01-10 NOTE — Telephone Encounter (Signed)
Pt c/o of Chest Pain: STAT if CP now or developed within 24 hours  1. Are you having CP right now?   Yes upon walking to clinic had slight twinge on L Side of Chest   2. Are you experiencing any other symptoms (ex. SOB, nausea, vomiting, sweating)? Nausea, weak, fatigued  3. How long have you been experiencing CP?  Last night first episode "heaviness"   4. Is your CP continuous or coming and going? Come and goes   5. Have you taken Nitroglycerin? No  ?

## 2015-01-10 NOTE — ED Notes (Signed)
Patient returned from xray via stretcher

## 2015-01-10 NOTE — Telephone Encounter (Signed)
The patient walked in to the clinic today with complaints of nausea and weakness x 1 month. He reports he was in [facility] on Thursday last week- an EKG was done that is scanned in his chart, but hard to read due to the darkness of it. RBBB noted on interpretation. He complains today of worsening nausea and some chest heaviness that has developed over the last 24 hours. He reports feeling worse today than he has.  He has a history of CABG in 2000- symptoms leading up to this was left sided chest pain. He also has a history of hairy cell leukemia- last HGB was checked about a week ago and was 9.7, the week prior he was 9.8. He appears weak in his eyes today, but his son who accompanies him here today, feels he looks no different than his baseline.  He did see Dr. Hardin Negus today, who felt his symptoms were cardiac in nature and advised he follow up with cardiology, therefore they walked in the clinic today. I have reviewed the patient's symptoms with Frank Bayley, NP who advises that the patient be seen in the ER for further evaluation and possible cardiac enzymes.  The patient and his son were notified of this The patient was assisted to his son's vehicle per wheelchair and transport around the building to the ER.  I have spoken with the ER nursing staff and advised them of the patient's symptoms and pending arrival to the ER.

## 2015-01-10 NOTE — ED Notes (Signed)
Patient presents to the ED with left sided chest discomfort intermittently since last night.  Patient states he had a cardiac bypass surgery last week.  Patient also has a history of leukemia that is in remission.  Patient reports increased weakness over the past 3 weeks and nausea.  Denies any vomiting.  Patient denies shortness of breath.

## 2015-01-10 NOTE — Discharge Instructions (Signed)

## 2015-01-10 NOTE — ED Provider Notes (Signed)
[facility] Emergency Department Provider Note  ____________________________________________  Time seen: Approximately 1:30 PM  I have reviewed the triage vital signs and the nursing notes.   HISTORY  Chief Complaint Chest Pain    HPI Frank Mcdaniel is a 81 y.o. male with a history of coronary artery disease who presents with 1 month of nausea and weakness with 2 episodes of chest discomfort to the left side starting last night at about 7 or 8 PM. He said that the episodes last about 1-2 minutes and would not describe him as pain but just a sensation to the left side of his chest. He denies any associated sweating, nausea vomiting or shortness of breath. There was no radiation of the sensation. He was seen by his primary care doctor this morning who thought that he heard a new murmur. He was then sent to his cardiologists, Dr. Caryl Comes who sent directly to the emergency department after having had the chest sensation. The patient said that it is chest sensation occurred while he was at rest. He is not having any exertional symptoms. He describes the weakness is diffuse and has been seen by urgent care and his primary care doctor already. So far he has had negative urine tests and an EKG with a right bundle branch block which is unchanged.   Past Medical History  Diagnosis Date  . Coronary artery disease   . BPH (benign prostatic hyperplasia)   . Hyperlipidemia   . Leukemia     Patient Active Problem List   Diagnosis Date Noted  . Chest pain 09/02/2014  . S/P CABG (coronary artery bypass graft) 11/08/2010  . HTN (hypertension) 11/08/2010  . Bradycardia 11/08/2010  . Hyperlipidemia 10/27/2009    Past Surgical History  Procedure Laterality Date  . Coronary artery bypass graft  2000  . Cataract extraction      Current Outpatient Rx  Name  Route  Sig  Dispense  Refill  . acyclovir (ZOVIRAX) 400 MG tablet   Oral   Take 1 tablet (400 mg total) by  mouth 2 (two) times daily.   60 tablet   2   . aspirin 81 MG EC tablet   Oral   Take 2 tablets (162 mg total) by mouth daily.   30 tablet   0   . atorvastatin (LIPITOR) 40 MG tablet   Oral   Take 40 mg by mouth daily.         . dapsone 100 MG tablet   Oral   Take 100 mg by mouth daily.           . Multiple Vitamin (MULTIVITAMIN) tablet   Oral   Take 1 tablet by mouth daily.           . pantoprazole (PROTONIX) 40 MG tablet   Oral   Take 40 mg by mouth daily.         . tamsulosin (FLOMAX) 0.4 MG CAPS capsule   Oral   Take 0.4 mg by mouth daily.           Allergies Review of patient's allergies indicates no known allergies.  Family History  Problem Relation Age of Onset  . Family history unknown: Yes    Social History History  Substance Use Topics  . Smoking status: Never Smoker   . Smokeless tobacco: Never Used  . Alcohol Use: No    Review of Systems Constitutional: No fever/chills Eyes: No visual changes. ENT: No sore throat. Cardiovascular: Denies  chest pain. Respiratory: Denies shortness of breath. Gastrointestinal: No abdominal pain.  No nausea, no vomiting.  No diarrhea.  No constipation. Genitourinary: Negative for dysuria. Musculoskeletal: Negative for back pain. Skin: Negative for rash. Neurological: Negative for headaches, focal weakness or numbness.  10-point ROS otherwise negative.  ____________________________________________   PHYSICAL EXAM:  VITAL SIGNS: ED Triage Vitals  Enc Vitals Group     BP 01/10/15 1232 161/69 mmHg     Pulse Rate 01/10/15 1232 63     Resp 01/10/15 1232 20     Temp 01/10/15 1232 98.6 F (37 C)     Temp Source 01/10/15 1232 Oral     SpO2 01/10/15 1232 97 %     Weight 01/10/15 1232 167 lb (75.751 kg)     Height 01/10/15 1232 5\' 8"  (1.727 m)     Head Cir --      Peak Flow --      Pain Score 01/10/15 1233 1     Pain Loc --      Pain Edu? --      Excl. in [place]? --     Constitutional: Alert and  oriented. Well appearing and in no acute distress. Eyes: Conjunctivae are normal. PERRL. EOMI. Head: Atraumatic. Nose: No congestion/rhinnorhea. Mouth/Throat: Mucous membranes are moist.  Oropharynx non-erythematous. Neck: No stridor.   Cardiovascular: Normal rate, regular rhythm. No obvious murmur.  Good peripheral circulation. Respiratory: Normal respiratory effort.  No retractions. Lungs CTAB. Gastrointestinal: Soft and nontender. No distention. No abdominal bruits. No CVA tenderness. Musculoskeletal: No lower extremity tenderness nor edema.  No joint effusions. Neurologic:  Normal speech and language. No gross focal neurologic deficits are appreciated. Speech is normal. No gait instability. Skin:  Skin is warm, dry and intact. No rash noted. Psychiatric: Mood and affect are normal. Speech and behavior are normal.  ____________________________________________   LABS (all labs ordered are listed, but only abnormal results are displayed)  Labs Reviewed  BASIC METABOLIC PANEL - Abnormal; Notable for the following:    Potassium 3.2 (*)    Creatinine, Ser 1.28 (*)    GFR calc non Af Amer 51 (*)    GFR calc Af Amer 59 (*)    All other components within normal limits  CBC - Abnormal; Notable for the following:    WBC 2.7 (*)    RBC 2.71 (*)    Hemoglobin 10.1 (*)    HCT 30.7 (*)    MCV 113.2 (*)    MCH 37.2 (*)    RDW 15.9 (*)    Platelets 129 (*)    All other components within normal limits  TROPONIN I   ____________________________________________  EKG  ED ECG REPORT I, Doran Stabler, the attending physician, personally viewed and interpreted this ECG.   Date: 01/10/2015  EKG Time: 1227  Rate: 66  Rhythm: normal sinus rhythm  Axis: Normal axis  Intervals:right bundle branch block  ST&T Change: T wave inversions in V2, V3 and 3 which are unchanged from previous EKG.  ____________________________________________  RADIOLOGY  No acute cardiopulmonary disease  on chest x-ray ____________________________________________   PROCEDURES    ____________________________________________   INITIAL IMPRESSION / ASSESSMENT AND PLAN / ED COURSE  Pertinent labs & imaging results that were available during my care of the patient were reviewed by me and considered in my medical decision making (see chart for details).  ----------------------------------------- 2:18 PM on 01/10/2015 -----------------------------------------  Patient resting comfortably. Talked to Dr. Sophronia Simas who works with Dr. Caryl Comes.  He discussed the patient's symptoms as well as results. Dr. Sophronia Simas believes that the patient does not need to be seen this afternoon in the clinic but should be rescheduled for a stress test as an outpatient. I discussed this with the patient and his son who are comfortable with this plan. Will be discharged home. Patient continues to be chest pain-free in the emergency department. ____________________________________________   FINAL CLINICAL IMPRESSION(S) / ED DIAGNOSES  Acute weakness. Acute chest pain. Initial visit.    Orbie Pyo, MD 01/10/15 1420

## 2015-01-12 ENCOUNTER — Observation Stay
Admission: EM | Admit: 2015-01-12 | Discharge: 2015-01-13 | Disposition: A | Discharge status: Home / Self Care | Payer: Medicare PPO | Attending: Internal Medicine | Admitting: Internal Medicine

## 2015-01-12 ENCOUNTER — Encounter: Payer: Self-pay | Admitting: Emergency Medicine

## 2015-01-12 ENCOUNTER — Emergency Department: Payer: Medicare PPO

## 2015-01-12 ENCOUNTER — Encounter: Payer: Self-pay | Admitting: Nurse Practitioner

## 2015-01-12 ENCOUNTER — Telehealth: Payer: Self-pay | Admitting: Cardiovascular Disease

## 2015-01-12 ENCOUNTER — Telehealth: Payer: Self-pay | Admitting: *Deleted

## 2015-01-12 ENCOUNTER — Ambulatory Visit: Payer: Medicare PPO | Admitting: Nurse Practitioner

## 2015-01-12 DIAGNOSIS — R109 Unspecified abdominal pain: Secondary | ICD-10-CM | POA: Insufficient documentation

## 2015-01-12 DIAGNOSIS — Z7982 Long term (current) use of aspirin: Secondary | ICD-10-CM | POA: Insufficient documentation

## 2015-01-12 DIAGNOSIS — R001 Bradycardia, unspecified: Secondary | ICD-10-CM | POA: Insufficient documentation

## 2015-01-12 DIAGNOSIS — D61818 Other pancytopenia: Secondary | ICD-10-CM | POA: Diagnosis not present

## 2015-01-12 DIAGNOSIS — R079 Chest pain, unspecified: Secondary | ICD-10-CM | POA: Diagnosis present

## 2015-01-12 DIAGNOSIS — Z79899 Other long term (current) drug therapy: Secondary | ICD-10-CM | POA: Insufficient documentation

## 2015-01-12 DIAGNOSIS — N400 Enlarged prostate without lower urinary tract symptoms: Secondary | ICD-10-CM | POA: Insufficient documentation

## 2015-01-12 DIAGNOSIS — R0789 Other chest pain: Principal | ICD-10-CM | POA: Insufficient documentation

## 2015-01-12 DIAGNOSIS — I10 Essential (primary) hypertension: Secondary | ICD-10-CM | POA: Diagnosis not present

## 2015-01-12 DIAGNOSIS — Z9849 Cataract extraction status, unspecified eye: Secondary | ICD-10-CM | POA: Insufficient documentation

## 2015-01-12 DIAGNOSIS — Z951 Presence of aortocoronary bypass graft: Secondary | ICD-10-CM | POA: Insufficient documentation

## 2015-01-12 DIAGNOSIS — E785 Hyperlipidemia, unspecified: Secondary | ICD-10-CM | POA: Diagnosis not present

## 2015-01-12 DIAGNOSIS — I2510 Atherosclerotic heart disease of native coronary artery without angina pectoris: Secondary | ICD-10-CM | POA: Insufficient documentation

## 2015-01-12 DIAGNOSIS — C9140 Hairy cell leukemia not having achieved remission: Secondary | ICD-10-CM | POA: Insufficient documentation

## 2015-01-12 DIAGNOSIS — R531 Weakness: Secondary | ICD-10-CM | POA: Diagnosis present

## 2015-01-12 DIAGNOSIS — Z9221 Personal history of antineoplastic chemotherapy: Secondary | ICD-10-CM | POA: Insufficient documentation

## 2015-01-12 DIAGNOSIS — I4510 Unspecified right bundle-branch block: Secondary | ICD-10-CM | POA: Insufficient documentation

## 2015-01-12 DIAGNOSIS — R110 Nausea: Secondary | ICD-10-CM | POA: Diagnosis present

## 2015-01-12 DIAGNOSIS — K8020 Calculus of gallbladder without cholecystitis without obstruction: Secondary | ICD-10-CM | POA: Diagnosis not present

## 2015-01-12 DIAGNOSIS — R0602 Shortness of breath: Secondary | ICD-10-CM | POA: Diagnosis not present

## 2015-01-12 DIAGNOSIS — R0600 Dyspnea, unspecified: Secondary | ICD-10-CM

## 2015-01-12 HISTORY — DX: Essential (primary) hypertension: I10

## 2015-01-12 HISTORY — DX: Unspecified asthma, uncomplicated: J45.909

## 2015-01-12 LAB — TROPONIN I
Troponin I: <0.03 ng/mL (ref <0.031)
Troponin I: <0.03 ng/mL (ref <0.031)
Troponin I: <0.03 ng/mL (ref <0.031)

## 2015-01-12 LAB — COMPREHENSIVE METABOLIC PANEL
ALBUMIN: 4.2 g/dL (ref 3.5–5.0)
ALK PHOS: 50 U/L (ref 38–126)
ALT: 9 U/L — ABNORMAL LOW (ref 17–63)
ANION GAP: 6 (ref 5–15)
AST: 23 U/L (ref 15–41)
BUN: 13 mg/dL (ref 6–20)
CHLORIDE: 109 mmol/L (ref 101–111)
CO2: 26 mmol/L (ref 22–32)
Calcium: 9 mg/dL (ref 8.9–10.3)
Creatinine, Ser: 1.24 mg/dL (ref 0.61–1.24)
GFR calc Af Amer: >60 mL/min (ref >60)
GFR, EST NON AFRICAN AMERICAN: 53 mL/min — AB (ref >60)
GLUCOSE: 101 mg/dL — AB (ref 65–99)
Potassium: 3.6 mmol/L (ref 3.5–5.1)
Sodium: 141 mmol/L (ref 135–145)
TOTAL PROTEIN: 6.4 g/dL — AB (ref 6.5–8.1)
Total Bilirubin: 1.4 mg/dL — ABNORMAL HIGH (ref 0.3–1.2)

## 2015-01-12 LAB — CBC
HCT: 29.2 % — ABNORMAL LOW (ref 40.0–52.0)
Hemoglobin: 9.8 g/dL — ABNORMAL LOW (ref 13.0–18.0)
MCH: 38.1 pg — ABNORMAL HIGH (ref 26.0–34.0)
MCHC: 33.5 g/dL (ref 32.0–36.0)
MCV: 113.8 fL — ABNORMAL HIGH (ref 80.0–100.0)
Platelets: 123 10*3/uL — ABNORMAL LOW (ref 150–440)
RBC: 2.57 MIL/uL — ABNORMAL LOW (ref 4.40–5.90)
RDW: 16.2 % — ABNORMAL HIGH (ref 11.5–14.5)
WBC: 2.7 10*3/uL — ABNORMAL LOW (ref 3.8–10.6)

## 2015-01-12 LAB — BRAIN NATRIURETIC PEPTIDE: B Natriuretic Peptide: 115 pg/mL — ABNORMAL HIGH (ref 0.0–100.0)

## 2015-01-12 MED ORDER — ONDANSETRON HCL 4 MG/2ML IJ SOLN
INTRAMUSCULAR | Status: AC
  Filled 2015-01-12: qty 2

## 2015-01-12 MED ORDER — ATORVASTATIN CALCIUM 20 MG PO TABS
40.0000 mg | ORAL_TABLET | Freq: Every day | ORAL | Status: DC
  Administered 2015-01-13: 40 mg via ORAL
  Filled 2015-01-12: qty 2

## 2015-01-12 MED ORDER — ENOXAPARIN SODIUM 40 MG/0.4ML ~~LOC~~ SOLN
40.0000 mg | SUBCUTANEOUS | Status: DC
Start: 2015-01-12 — End: 2015-01-13
  Administered 2015-01-12: 40 mg via SUBCUTANEOUS

## 2015-01-12 MED ORDER — LORAZEPAM 0.5 MG PO TABS
0.5000 mg | ORAL_TABLET | Freq: Once | ORAL | Status: AC
  Administered 2015-01-12: 0.5 mg via ORAL

## 2015-01-12 MED ORDER — NITROGLYCERIN 0.4 MG SL SUBL
0.4000 mg | SUBLINGUAL_TABLET | Freq: Once | SUBLINGUAL | Status: AC
  Administered 2015-01-12: 0.4 mg via SUBLINGUAL

## 2015-01-12 MED ORDER — PANTOPRAZOLE SODIUM 40 MG PO TBEC
40.0000 mg | DELAYED_RELEASE_TABLET | Freq: Every day | ORAL | Status: DC
  Administered 2015-01-13: 40 mg via ORAL
  Filled 2015-01-12: qty 1

## 2015-01-12 MED ORDER — ONDANSETRON HCL 4 MG PO TABS: 4.0000 mg | ORAL_TABLET | Freq: Four times a day (QID) | ORAL | Status: DC | PRN

## 2015-01-12 MED ORDER — SODIUM CHLORIDE 0.9 % IJ SOLN: 3.0000 mL | INTRAMUSCULAR | Status: DC | PRN

## 2015-01-12 MED ORDER — TAMSULOSIN HCL 0.4 MG PO CAPS
0.4000 mg | ORAL_CAPSULE | Freq: Every day | ORAL | Status: DC
  Administered 2015-01-13: 0.4 mg via ORAL
  Filled 2015-01-12: qty 1

## 2015-01-12 MED ORDER — ASPIRIN EC 325 MG PO TBEC
325.0000 mg | DELAYED_RELEASE_TABLET | Freq: Every day | ORAL | Status: DC
  Administered 2015-01-13: 325 mg via ORAL
  Filled 2015-01-12: qty 1

## 2015-01-12 MED ORDER — ALUM & MAG HYDROXIDE-SIMETH 200-200-20 MG/5ML PO SUSP: 30.0000 mL | Freq: Four times a day (QID) | ORAL | Status: DC | PRN

## 2015-01-12 MED ORDER — ONDANSETRON HCL 4 MG/2ML IJ SOLN: 4.0000 mg | Freq: Once | INTRAMUSCULAR | Status: DC

## 2015-01-12 MED ORDER — LORAZEPAM 0.5 MG PO TABS
ORAL_TABLET | ORAL | Status: AC
  Filled 2015-01-12: qty 1

## 2015-01-12 MED ORDER — DAPSONE 100 MG PO TABS
100.0000 mg | ORAL_TABLET | Freq: Every day | ORAL | Status: DC
  Administered 2015-01-13: 100 mg via ORAL
  Filled 2015-01-12 (×2): qty 1

## 2015-01-12 MED ORDER — ASPIRIN EC 325 MG PO TBEC: 325.0000 mg | DELAYED_RELEASE_TABLET | Freq: Every day | ORAL | Status: DC

## 2015-01-12 MED ORDER — ONDANSETRON HCL 4 MG/2ML IJ SOLN
4.0000 mg | Freq: Four times a day (QID) | INTRAMUSCULAR | Status: DC | PRN
Start: 2015-01-12 — End: 2015-01-13

## 2015-01-12 MED ORDER — NITROGLYCERIN 0.4 MG SL SUBL
SUBLINGUAL_TABLET | SUBLINGUAL | Status: AC
  Filled 2015-01-12: qty 1

## 2015-01-12 MED ORDER — SENNOSIDES-DOCUSATE SODIUM 8.6-50 MG PO TABS: 1.0000 | ORAL_TABLET | Freq: Every evening | ORAL | Status: DC | PRN

## 2015-01-12 NOTE — ED Notes (Signed)
Pt placed on 2 liters o2 via [place] 

## 2015-01-12 NOTE — ED Notes (Signed)
MD at bedside. 

## 2015-01-12 NOTE — ED Notes (Signed)
Pt assisted up to restroom, tolerated well, skin warm and dry, still c/o some slight left sided cp that he rates 1/10, denies any sob at present, will continue to monitor

## 2015-01-12 NOTE — H&P (Addendum)
[facility] [place] NAME: Frank Mcdaniel    MR#:  [id number]  DATE OF BIRTH:  11-28-1932  DATE OF ADMISSION:  01/12/2015  PRIMARY CARE PHYSICIAN: Morton Peters, MD   REQUESTING/REFERRING PHYSICIAN: Dr. Jimmye Norman  CHIEF COMPLAINT:  Chest pain  HISTORY OF PRESENT ILLNESS:  Frank Mcdaniel  is a 81 y.o. male with a known history of coronary artery disease, status post CABG in year 2000 is presented to the ED with  chief complaint of left-sided chest pain for the past 4 days. It was intermittent in nature. Today patient was mowing his yard and then the chest pain started radiating to the right side of the shoulder. Patient came into the ED and heart evaluation done 2 days ago and at that 0.2 troponins were negative and patient was sent home. As patient  is still having chest pain the ED physician has discussed with his cardiology Dr. Fletcher Anon, who has recommended stress test tomorrow. During my examination patient is resting comfortably and his son is at bedside.  PAST MEDICAL HISTORY:   Past Medical History  Diagnosis Date  . Coronary artery disease     a. 2000 s/p CABG;  b. 10/2008 Neg MV, EF 64%.  [name] BPH (benign prostatic hyperplasia)   . Hyperlipidemia   . Leukemia   . H/O echocardiogram     a. 10/2005 Echo: nl EF.  [name] Atypical chest pain     a. 08/2014  . Asymptomatic Sinus Bradycardia     PAST SURGICAL HISTOIRY:   Past Surgical History  Procedure Laterality Date  . Coronary artery bypass graft  2000  . Cataract extraction      SOCIAL HISTORY:   History  Substance Use Topics  . Smoking status: Never Smoker   . Smokeless tobacco: Never Used  . Alcohol Use: No    FAMILY HISTORY:   Family History  Problem Relation Age of Onset  . Family history unknown: Yes    DRUG ALLERGIES:  No Known Allergies  REVIEW OF SYSTEMS:  CONSTITUTIONAL: No fever, fatigue or weakness.  EYES: No blurred or double vision.  EARS,  NOSE, AND THROAT: No tinnitus or ear pain.  RESPIRATORY: No cough, shortness of breath, wheezing or hemoptysis.  CARDIOVASCULAR: Left-sided of the chest hurts with pressure,  orthopnea, edema.  GASTROINTESTINAL: No nausea, vomiting, diarrhea or abdominal pain.  GENITOURINARY: No dysuria, hematuria.  ENDOCRINE: No polyuria, nocturia,  HEMATOLOGY: No anemia, easy bruising or bleeding SKIN: No rash or lesion. MUSCULOSKELETAL: No joint pain or arthritis.   NEUROLOGIC: No tingling, numbness, weakness.  PSYCHIATRY: No anxiety or depression.   MEDICATIONS AT HOME:   Prior to Admission medications   Medication Sig Start Date End Date Taking? Authorizing Provider  acyclovir (ZOVIRAX) 400 MG tablet Take 1 tablet (400 mg total) by mouth 2 (two) times daily. 12/12/14  Yes Dallas Schimke, MD  aspirin 81 MG EC tablet Take 2 tablets (162 mg total) by mouth daily. 11/08/10  Yes [name], MD  atorvastatin (LIPITOR) 40 MG tablet Take 40 mg by mouth daily.   Yes Historical Provider, MD  dapsone 100 MG tablet Take 100 mg by mouth daily.     Yes Historical Provider, MD  Multiple Vitamin (MULTIVITAMIN) tablet Take 1 tablet by mouth daily.     Yes Historical Provider, MD  pantoprazole (PROTONIX) 40 MG tablet Take 40 mg by mouth daily.   Yes Historical Provider, MD  tamsulosin [facility])  0.4 MG CAPS capsule Take 0.4 mg by mouth daily.   Yes Historical Provider, MD      VITAL SIGNS:  Blood pressure 146/61, pulse 63, temperature 98.5 F (36.9 C), temperature source Oral, resp. rate 29, height 5\' 8"  (1.727 m), weight 73.936 kg (163 lb), SpO2 92 %.  PHYSICAL EXAMINATION:  GENERAL:  81 y.o.-year-old patient lying in the bed with no acute distress.  EYES: Pupils equal, round, reactive to light and accommodation. No scleral icterus. Extraocular muscles intact.  HEENT: Head atraumatic, normocephalic. Oropharynx and nasopharynx clear.  NECK:  Supple, no jugular venous distention. No thyroid enlargement, no  tenderness.  LUNGS: Normal breath sounds bilaterally, no wheezing, rales,rhonchi or crepitation. No use of accessory muscles of respiration.  reproducible anterior chest wall tenderness on the left side with palpation  CARDIOVASCULAR: S1, S2 normal. No murmurs, rubs, or gallops.  ABDOMEN: Soft, nontender, nondistended. Bowel sounds present. No organomegaly or mass.  EXTREMITIES: No pedal edema, cyanosis, or clubbing.  NEUROLOGIC: Cranial nerves II through XII are intact. Muscle strength 5/5 in all extremities. Sensation intact. Gait not checked.  PSYCHIATRIC: The patient is alert and oriented x 3.  SKIN: No obvious rash, lesion, or ulcer.   LABORATORY PANEL:   CBC  Recent Labs Lab 01/12/15 1334  WBC 2.7*  HGB 9.8*  HCT 29.2*  PLT 123*   ------------------------------------------------------------------------------------------------------------------  Chemistries   Recent Labs Lab 01/12/15 1334  NA 141  K 3.6  CL 109  CO2 26  GLUCOSE 101*  BUN 13  CREATININE 1.24  CALCIUM 9.0  AST 23  ALT 9*  ALKPHOS 50  BILITOT 1.4*   ------------------------------------------------------------------------------------------------------------------  Cardiac Enzymes  Recent Labs Lab 01/12/15 1334  TROPONINI <0.03   ------------------------------------------------------------------------------------------------------------------  RADIOLOGY:  Dg Chest 2 View  01/12/2015   CLINICAL DATA:  Left-sided chest pain  EXAM: CHEST - 2 VIEW  COMPARISON:  01/10/2015  FINDINGS: Cardiac shadow is stable. Postsurgical changes are again noted. The lungs are clear bilaterally. No focal infiltrate is noted. No acute bony abnormality is seen.  IMPRESSION: No acute abnormality noted.   Electronically Signed   By: Inez Catalina M.D.   On: 01/12/2015 13:32    EKG:   Orders placed or performed during the hospital encounter of 01/12/15  . ED EKG (<93mins upon arrival to the ED)  . ED EKG (<49mins upon  arrival to the ED)  . ED EKG  . ED EKG   12-lead EKG-normal sinus rhythm at 60-64 bpm. Normal PR interval and right bundle branch block. No acute ST-T wave changes noticed.   IMPRESSION AND PLAN:   Assessment and plan  #1. Chest pain with history of coronary artery disease status post CABG we will admit him to observation status. Monitor him on telemetry. Scheduled for Myoview stress test in a.m. as recommended by Dr. Cindee Lame. Cardiac consult is placed to Dr. Fletcher Anon Cycle cardia biomarkers. Continue aspirin and statin. Patient is not on beta blocker probably given the history of bradycardia . Will defer that to cardiology at this time    #2 hypertension- blood pressure is stable Add small dose beta blocker will monitor his heart rate closely given the past medical history of bradycardia,  #3Hyperlipidemia Check fasting lipid panel Continue statin   #4 hairy cell leukemia  Status post chemotherapy.  follow-up with Dr. Ma Hillock as recommended by him.  #5 generalized weakness Check B12 levels TS H levels were checked recently by primary care physician and they were normal per pt  PT consult   All the records are reviewed and case discussed with ED provider. Management plans discussed with the patient, family and they are in agreement.  CODE STATUS: Full code, son is the medical power of attorney  TOTAL TIME TAKING CARE OF THIS PATIENT: 68minutes.    Nicholes Mango M.D on 01/12/2015 at 4:28 PM  Between 7am to 6pm - Pager - [phone]  After 6pm go to [email or web address] - password [email or web address] [facility]  Office  [phone]  CC: Primary care physician; Morton Peters, MD

## 2015-01-12 NOTE — ED Notes (Signed)
Pt here for recurrent left sided cp that began yesterday. Pt appears pale, states he feels like something "isn't right." Nauseated as well.

## 2015-01-12 NOTE — ED Provider Notes (Signed)
[facility] Emergency Department Provider Note     Time seen: ----------------------------------------- 12:03 PM on 01/12/2015 -----------------------------------------    I have reviewed the triage vital signs and the nursing notes.   HISTORY  Chief Complaint Chest Pain    HPI Frank Mcdaniel is a 81 y.o. male who presents ER for left-sided chest twinges that have been happening for last 4 days. Patient's felt weak and nauseous, denies fevers chills or other complaints. Was seen here recently for same without an etiology identified. Patient has not been ill recently, was scheduled to have a cardiology appointment 1 hour from now. Symptoms are mild to moderate this time, nothing sees make it better or worse.   Past Medical History  Diagnosis Date  . Coronary artery disease     a. 2000 s/p CABG;  b. 10/2008 Neg MV, EF 64%.  [name] BPH (benign prostatic hyperplasia)   . Hyperlipidemia   . Leukemia   . H/O echocardiogram     a. 10/2005 Echo: nl EF.  [name] Atypical chest pain     a. 08/2014  . Asymptomatic Sinus Bradycardia     Patient Active Problem List   Diagnosis Date Noted  . Coronary artery disease   . Chest pain 09/02/2014  . S/P CABG (coronary artery bypass graft) 11/08/2010  . HTN (hypertension) 11/08/2010  . Bradycardia 11/08/2010  . Hyperlipidemia 10/27/2009    Past Surgical History  Procedure Laterality Date  . Coronary artery bypass graft  2000  . Cataract extraction      Allergies Review of patient's allergies indicates no known allergies.  Social History History  Substance Use Topics  . Smoking status: Never Smoker   . Smokeless tobacco: Never Used  . Alcohol Use: No    Review of Systems Constitutional: Negative for fever. Eyes: Negative for visual changes. ENT: Negative for sore throat. Cardiovascular: Positive for chest pain Respiratory: Negative for shortness of breath. Gastrointestinal: Negative for abdominal  pain, positive for nausea Genitourinary: Negative for dysuria. Musculoskeletal: Negative for back pain. Skin: Negative for rash. Neurological: Negative for headaches, positive for weakness  10-point ROS otherwise negative.  ____________________________________________   PHYSICAL EXAM:  VITAL SIGNS: ED Triage Vitals  Enc Vitals Group     BP 01/12/15 1143 150/59 mmHg     Pulse Rate 01/12/15 1143 63     Resp 01/12/15 1143 18     Temp 01/12/15 1143 98.5 F (36.9 C)     Temp Source 01/12/15 1143 Oral     SpO2 01/12/15 1143 94 %     Weight 01/12/15 1143 163 lb (73.936 kg)     Height 01/12/15 1143 5\' 8"  (1.727 m)     Head Cir --      Peak Flow --      Pain Score 01/12/15 1144 0     Pain Loc --      Pain Edu? --      Excl. in [place]? --     Constitutional: Alert and oriented. Well appearing and in no distress. Eyes: Conjunctivae are normal. PERRL. Normal extraocular movements. ENT   Head: Normocephalic and atraumatic.   Nose: No congestion/rhinnorhea.   Mouth/Throat: Mucous membranes are moist.   Neck: No stridor. Hematological/Lymphatic/Immunilogical: No cervical lymphadenopathy. Cardiovascular: Normal rate, regular rhythm. Normal and symmetric distal pulses are present in all extremities. No murmurs, rubs, or gallops. Respiratory: Normal respiratory effort without tachypnea nor retractions. Breath sounds are clear and equal bilaterally. No wheezes/rales/rhonchi. Gastrointestinal: Soft and nontender. No  distention. No abdominal bruits. There is no CVA tenderness. Musculoskeletal: Nontender with normal range of motion in all extremities. No joint effusions.  No lower extremity tenderness nor edema. Neurologic:  Normal speech and language. No gross focal neurologic deficits are appreciated. Speech is normal. No gait instability. Skin:  Skin is warm, dry and intact. No rash noted. Psychiatric: Mood and affect are normal. Speech and behavior are normal. Patient exhibits  appropriate insight and judgment. ____________________________________________  EKG: Interpreted by me. Normal sinus rhythm with a rate of 62, right bundle branch block, normal axis, no evidence of hypertrophy or acute infarction.  ____________________________________________  ED COURSE:  Pertinent labs & imaging results that were available during my care of the patient were reviewed by me and considered in my medical decision making (see chart for details). We'll have basic labs, EKG and reevaluation. ____________________________________________    LABS (pertinent positives/negatives)  Labs Reviewed  CBC - Abnormal; Notable for the following:    WBC 2.7 (*)    RBC 2.57 (*)    Hemoglobin 9.8 (*)    HCT 29.2 (*)    MCV 113.8 (*)    MCH 38.1 (*)    RDW 16.2 (*)    Platelets 123 (*)    All other components within normal limits  COMPREHENSIVE METABOLIC PANEL - Abnormal; Notable for the following:    Glucose, Bld 101 (*)    Total Protein 6.4 (*)    ALT 9 (*)    Total Bilirubin 1.4 (*)    GFR calc non Af Amer 53 (*)    All other components within normal limits  TROPONIN I  BRAIN NATRIURETIC PEPTIDE    RADIOLOGY  Chest x-ray  FINDINGS: Cardiac shadow is stable. Postsurgical changes are again noted. The lungs are clear bilaterally. No focal infiltrate is noted. No acute bony abnormality is seen.  IMPRESSION: No acute abnormality noted. ____________________________________________  FINAL ASSESSMENT AND PLAN  Chest pain and weakness  Plan: Pain seems noncardiac, but recurred in the ER with much worsening of his symptoms. Patient was given nitroglycerin. Due to the recurrent nature of this and the severity of his symptoms, I feel it is best that the patient be ruled out overnight and kept here for observation   Earleen Newport, MD   Earleen Newport, MD 01/12/15 1444

## 2015-01-12 NOTE — Telephone Encounter (Signed)
S/w patient who states discomfort on left side of chest and is nauseous. Denies vomiting or left arm radiating, sweating or being short of breath.    States has been going on for several days and getting worse. States he has called his son who lives in [place] who is on his way to patient's house. Indicates he wants to move his appointment time to an earlier time today. States he doesn't think he can wait until 1:45pm.  Stated to patient that Ignacia Bayley, NP is not currently in the office and advised patient to go to ER either via son or by calling 911. Patient verbalized understanding, stated he probably would not make appointment today and would go to the ER.

## 2015-01-12 NOTE — Telephone Encounter (Signed)
Patient is cancelling appt and in [place] ED .  Son wants [place] to know patient is admitted to ed and having xrays.  Patient is feeling worse now.  Son would like [place] to know that patient is in ED incase we need to coordinate care with them or if we need to see patient.  He wanted Dr. Rockey Situ to know and see what he suggests the patient do as far as treatment.

## 2015-01-12 NOTE — Telephone Encounter (Signed)
Pt calling stating he is extremely weak and is some what nauseous, stating chest hurts left side.  Would like to come in now.  Please advise.

## 2015-01-13 ENCOUNTER — Observation Stay (HOSPITAL_BASED_OUTPATIENT_CLINIC_OR_DEPARTMENT_OTHER)
Admit: 2015-01-13 | Discharge: 2015-01-13 | Disposition: A | Discharge status: Home / Self Care | Payer: Medicare PPO | Attending: Nurse Practitioner | Admitting: Nurse Practitioner

## 2015-01-13 ENCOUNTER — Encounter: Payer: Self-pay | Admitting: Nurse Practitioner

## 2015-01-13 ENCOUNTER — Observation Stay: Payer: Medicare PPO

## 2015-01-13 ENCOUNTER — Observation Stay (HOSPITAL_BASED_OUTPATIENT_CLINIC_OR_DEPARTMENT_OTHER): Payer: Medicare PPO

## 2015-01-13 DIAGNOSIS — R0789 Other chest pain: Secondary | ICD-10-CM | POA: Diagnosis not present

## 2015-01-13 DIAGNOSIS — R079 Chest pain, unspecified: Secondary | ICD-10-CM | POA: Diagnosis not present

## 2015-01-13 DIAGNOSIS — I361 Nonrheumatic tricuspid (valve) insufficiency: Secondary | ICD-10-CM

## 2015-01-13 LAB — LIPID PANEL
Cholesterol: 85 mg/dL (ref 0–200)
HDL: 36 mg/dL — AB (ref >40)
LDL Cholesterol: 37 mg/dL (ref 0–99)
Total CHOL/HDL Ratio: 2.4 RATIO
Triglycerides: 62 mg/dL (ref <150)
VLDL: 12 mg/dL (ref 0–40)

## 2015-01-13 LAB — CBC
HCT: 29.7 % — ABNORMAL LOW (ref 40.0–52.0)
Hemoglobin: 10.1 g/dL — ABNORMAL LOW (ref 13.0–18.0)
MCH: 38.7 pg — ABNORMAL HIGH (ref 26.0–34.0)
MCHC: 34.1 g/dL (ref 32.0–36.0)
MCV: 113.6 fL — AB (ref 80.0–100.0)
Platelets: 120 10*3/uL — ABNORMAL LOW (ref 150–440)
RBC: 2.61 MIL/uL — AB (ref 4.40–5.90)
RDW: 16.1 % — ABNORMAL HIGH (ref 11.5–14.5)
WBC: 2.5 10*3/uL — AB (ref 3.8–10.6)

## 2015-01-13 LAB — NM MYOCAR MULTI W/SPECT W/WALL MOTION / EF
CHL CUP NUCLEAR SRS: 0
CHL CUP NUCLEAR SSS: 1
CHL CUP RESTING HR STRESS: 64 {beats}/min
CSEPED: 0 min
CSEPHR: 67 %
Estimated workload: 1 METS
Exercise duration (sec): 0 s
LV dias vol: 71 mL
LV sys vol: 26 mL
MPHR: 140 {beats}/min
Nuc Stress EF: 54 %
Peak HR: 95 {beats}/min
SDS: 1

## 2015-01-13 LAB — BASIC METABOLIC PANEL
Anion gap: 5 (ref 5–15)
BUN: 15 mg/dL (ref 6–20)
CALCIUM: 8.7 mg/dL — AB (ref 8.9–10.3)
CO2: 27 mmol/L (ref 22–32)
Chloride: 109 mmol/L (ref 101–111)
Creatinine, Ser: 1.48 mg/dL — ABNORMAL HIGH (ref 0.61–1.24)
GFR calc Af Amer: 50 mL/min — ABNORMAL LOW (ref >60)
GFR calc non Af Amer: 43 mL/min — ABNORMAL LOW (ref >60)
Glucose, Bld: 95 mg/dL (ref 65–99)
Potassium: 3.5 mmol/L (ref 3.5–5.1)
SODIUM: 141 mmol/L (ref 135–145)

## 2015-01-13 LAB — TROPONIN I

## 2015-01-13 LAB — VITAMIN B12: VITAMIN B 12: 294 pg/mL (ref 180–914)

## 2015-01-13 MED ORDER — ASPIRIN EC 81 MG PO TBEC: 81.0000 mg | DELAYED_RELEASE_TABLET | Freq: Every day | ORAL | Status: DC

## 2015-01-13 MED ORDER — IOHEXOL 350 MG/ML SOLN
75.0000 mL | Freq: Once | INTRAVENOUS | Status: AC | PRN
  Administered 2015-01-13: 75 mL via INTRAVENOUS

## 2015-01-13 MED ORDER — TECHNETIUM TC 99M SESTAMIBI - CARDIOLITE
32.5500 | Freq: Once | INTRAVENOUS | Status: AC | PRN
Start: 2015-01-13 — End: 2015-01-13
  Administered 2015-01-13: 32.55 via INTRAVENOUS

## 2015-01-13 MED ORDER — REGADENOSON 0.4 MG/5ML IV SOLN
0.4000 mg | Freq: Once | INTRAVENOUS | Status: AC
  Administered 2015-01-13: 0.4 mg via INTRAVENOUS

## 2015-01-13 MED ORDER — TECHNETIUM TC 99M SESTAMIBI - CARDIOLITE
13.4500 | Freq: Once | INTRAVENOUS | Status: AC | PRN
  Administered 2015-01-13: 13.45 via INTRAVENOUS

## 2015-01-13 NOTE — Discharge Summary (Signed)
[facility] [place] NAME: Frank Mcdaniel    MR#:  [id number]  DATE OF BIRTH:  1933-02-14  DATE OF ADMISSION:  01/12/2015 ADMITTING PHYSICIAN: Nicholes Mango, MD  DATE OF DISCHARGE: 01/13/2015 PRIMARY CARE PHYSICIAN: Morton Peters, MD    ADMISSION DIAGNOSIS:  Nausea [R11.0] Weakness [R53.1] Chest pain [R07.9] Chest pain, unspecified chest pain type [R07.9]  DISCHARGE DIAGNOSIS:  Active Problems:   Chest pain   SECONDARY DIAGNOSIS:   Past Medical History  Diagnosis Date  . Coronary artery disease     a. 2000 s/p CABG;  b. 10/2008 Neg MV, EF 64%.  [name] BPH (benign prostatic hyperplasia)   . Hyperlipidemia   . Leukemia   . H/O echocardiogram     a. 10/2005 Echo: nl EF.  [name] Atypical chest pain     a. 08/2014  . Asymptomatic Sinus Bradycardia     HOSPITAL COURSE:  This is an 81 year old male who presented with weakness and chest pain. For further details please further H&P.    1. Chest pain with history of CAD status post CABG: Cardiology was consulted. Patient underwent a stress test. His stress test was read as low risk. He also underwent a 2 D ECHO which showed a normal EF and no major valvular issues. His cardiac markers were negative. Telemetry showed no acute EKG changes.  2. Weakness and fatigue:This may be due to his hypoxia. He also has bradycardia, but has been bradycardic for sometime. He was found to have low room air saturations. He underwent a chest CT which did not show PE or an etiology of hypoxia. His ECHO also was unremarkable He was discharged with home O2 and will be set up for a Holter monitor. He will follow-up with cardiology.   3. BPH: Patient will continue on Flomax.  4. Hyperlipidemia: Patient will continue Lipitor.  5. Hypoxia: We do not have a clear etiology for his hypoxia. His O2 saturations on Room air with exertion go down to 86%. His ECHO was unremarkable as well as chest CT and stress test.  He ws discharged with home O2. We will see if using the O2 helps with his symptoms of fatigue as well.    HOME WITH HEART HEALTHY DIET  CONSULTS OBTAINED:  Treatment Team:  Wellington Hampshire, MD  DRUG ALLERGIES:  No Known Allergies  DISCHARGE MEDICATIONS:   Current Discharge Medication List    CONTINUE these medications which have NOT CHANGED   Details  acyclovir (ZOVIRAX) 400 MG tablet Take 1 tablet (400 mg total) by mouth 2 (two) times daily. Qty: 60 tablet, Refills: 2    aspirin 81 MG EC tablet Take 2 tablets (162 mg total) by mouth daily. Qty: 30 tablet, Refills: 0    atorvastatin (LIPITOR) 40 MG tablet Take 20 mg by mouth daily.     dapsone 100 MG tablet Take 100 mg by mouth daily.      Multiple Vitamin (MULTIVITAMIN) tablet Take 1 tablet by mouth daily.      pantoprazole (PROTONIX) 40 MG tablet Take 40 mg by mouth daily.    ranitidine (ZANTAC) 150 MG capsule Take 150 mg by mouth 2 (two) times daily.    tamsulosin (FLOMAX) 0.4 MG CAPS capsule Take 0.4 mg by mouth daily.              Today    Patient feeling fine this morning. Patient is without any symptoms morning. Patient denies chest pain  or weakness.   VITAL SIGNS:   Blood pressure 99/38, pulse 59, temperature 98.3 F (36.8 C), temperature source Oral, resp. rate 18, height 5\' 8"  (1.727 m), weight 69.446 kg (153 lb 1.6 oz), SpO2 88 %.   REVIEW OF SYSTEMS:  Review of Systems  Constitutional: Positive for malaise/fatigue. Negative for fever, chills and weight loss.  HENT: Positive for hearing loss.   Eyes: Negative for blurred vision and pain.  Respiratory: Negative for cough, hemoptysis, sputum production and shortness of breath.   Cardiovascular: Negative for chest pain, palpitations, orthopnea and claudication.  Gastrointestinal: Negative for nausea, vomiting, abdominal pain, diarrhea and constipation.  Neurological: Negative for dizziness, tingling and headaches.     PHYSICAL EXAMINATION:   GENERAL:  81 y.o.-year-old patient lying in the bed with no acute distress.  NECK:  Supple, no jugular venous distention. No thyroid enlargement, no tenderness.  LUNGS: Normal breath sounds bilaterally, no wheezing, rales,rhonchi  No use of accessory muscles of respiration.  CARDIOVASCULAR: bradycardia HR 58. No murmurs, rubs, or gallops.  ABDOMEN: Soft, non-tender, non-distended. Bowel sounds present. No organomegaly or mass.  EXTREMITIES: No pedal edema, cyanosis, or clubbing.  PSYCHIATRIC: The patient is alert and oriented x 3.  SKIN: No obvious rash, lesion, or ulcer.   DATA REVIEW:   CBC  Recent Labs Lab 01/13/15 0509  WBC 2.5*  HGB 10.1*  HCT 29.7*  PLT 120*    Chemistries   Recent Labs Lab 01/12/15 1334 01/13/15 0509  NA 141 141  K 3.6 3.5  CL 109 109  CO2 26 27  GLUCOSE 101* 95  BUN 13 15  CREATININE 1.24 1.48*  CALCIUM 9.0 8.7*  AST 23  --   ALT 9*  --   ALKPHOS 50  --   BILITOT 1.4*  --     Cardiac Enzymes  Recent Labs Lab 01/12/15 1714 01/12/15 2227 01/13/15 0509  TROPONINI <0.03 <0.03 <0.03    Microbiology Results  @MICRORSLT48 @  RADIOLOGY:  Dg Chest 2 View  01/12/2015   C  IMPRESSION: No acute abnormality noted.   Electronically Signed   By: Inez Catalina M.D.   On: 01/12/2015 13:32      Management plans discussed with the patient and he is in agreement. Stable for discharge home  Patient should follow up with Cardiology and PCP on one week  CODE STATUS:     Code Status Orders        Start     Ordered   01/12/15 1802  Full code   Continuous     01/12/15 1801    Advance Directive Documentation        Most Recent Value   Type of Advance Directive  Living will, Healthcare Power of Attorney   Pre-existing out of facility DNR order (yellow form or pink MOST form)     "MOST" Form in Place?        TOTAL TIME TAKING CARE OF THIS PATIENT: 35 minutes.    Usman Millett M.D on 01/13/2015 at 9:20 AM  Between 7am to 6pm - Pager -  [phone] After 6pm go to [email or web address] - password [email or web address] [place] [facility]  Office  [phone]  CC: Primary care physician; Morton Peters, MD

## 2015-01-13 NOTE — Care Management (Signed)
Chest CT is negative for pulmonary embolus.  Echo and stress results pending.  Will anticipate discharge today with home 02 if echo and stress results do not indicate there is a need for continued stay.  Son and patient verbalize that feel that patient is at his baseline line.  Finally decided will pay private for a couple of portable 02 tanks.  Will purchase pulse ox and will monitor sats.  Referral to [facility] for 2 portable tanks.  Patient will also be fitted with 72 hour holter monitor

## 2015-01-13 NOTE — Progress Notes (Signed)
PT Cancellation Note  Patient Details Name: Frank Mcdaniel MRN: [id number] DOB: February 23, 1933   Cancelled Treatment:    Reason Eval/Treat Not Completed: Patient at procedure or test/unavailable (Currently off unit for myoview stress test.  Will re-attempt at later time this date as patient available and medically appropriate)   Anwen Cannedy H. Owens Shark, PT, DPT 01/13/2015, 9:44 AM [phone]

## 2015-01-13 NOTE — Progress Notes (Signed)
*  PRELIMINARY RESULTS* Echocardiogram 2D Echocardiogram has been performed.  Laqueta Jean Hege 01/13/2015, 2:38 PM

## 2015-01-13 NOTE — Progress Notes (Signed)
   Frank Mcdaniel presented for a lexiscan cardiolite today.  No immediate complications.  Stress imaging pending.  Murray Hodgkins, NP 01/13/2015, 10:14 AM

## 2015-01-13 NOTE — Care Management (Signed)
Informed that patient has qualified for home 02.  REview of the medical record and history does not provide documentation of a chronic diagnosis that would qualify him for oxygen covered by medicare.  Does have a diagnosis of CAD even though is chronic, would not qualify.  He does not appear to be on any medications that would indicate has chronic condition that would affect 02 sats.  Last documented EF from 2010 or 2007  EF 64%.  He does have hx  Leukemia.  Stress test results pending.  If current information available if patient is discharged home home 02, would have to pay out of pocket.  Unsure at present if patient is going to require further work up.  Updated primary nurse

## 2015-01-13 NOTE — Consult Note (Signed)
CARDIOLOGY CONSULT NOTE   Patient ID: Frank Mcdaniel MRN: [id number], DOB/AGE: [birth year]   Admit date: 01/12/2015 Date of Consult: 01/13/2015   Primary Physician: Morton Peters, MD Primary Cardiologist: Johnny Bridge, MD   Pt. Profile  81 y/o male with a h/o CAD s/p CABG in 2000, who presented to the ED yesterday for the second time this week w/ c/p and neg troponins.  Problem List  Past Medical History  Diagnosis Date  . Coronary artery disease     a. 2000 s/p CABG;  b. 10/2008 Neg MV, EF 64%.  [name] BPH (benign prostatic hyperplasia)   . Hyperlipidemia   . Leukemia   . H/O echocardiogram     a. 10/2005 Echo: nl EF.  [name] Atypical chest pain     a. 08/2014  . Asymptomatic Sinus Bradycardia     Past Surgical History  Procedure Laterality Date  . Coronary artery bypass graft  2000  . Cataract extraction      Allergies  No Known Allergies  HPI   81 y/o male with the above past medical history.  He is s/p CABG in 2000.  He also has a h/o hairy cell eukemia and is followed closely by oncology.  Since the summer of 2015, he has been experiencing frequent nausea and queasiness.  He was evaluated by GI in 04/2014 w/ EGD and was placed on prilosec.  This seemed to help his queasiness some, but he has continued to have a fair amt of nausea on almost a daily basis.  Despite this, his appetite has been good and he has not had any change in his PO intake.  Further, he has never had vomiting, heartburn/gerd Ss, constipation, or diarrhea.  No melena or brbpr.  About a month ago, he was seen by GI again 2/2 ongoing nausea, and his PPI was switched to protonix and zantac was added in the PM.  He thinks that the addition of zantac has helped some but he continues to have a low level of nausea daily.    3 weeks ago, he was doing some pressure washing on his patio and while moving patio furniture, he developed abrupt weakness.  He did not have c/p or dyspnea.  Since that time, he has had  persistent weakness described as "No energy. No get up and go."  He denies DOE, palpitations, pnd, orthopnea, edema, presyncope, or syncope.  Because of persistent weakness, he presented to the ED on 6/7.  While there, he mentioned that he was also feeling a very mild, 1/10 "sensation" in his left upper chest, which was different from prior angina (exertional sharp c/p).  ECG was non-acute and trop was neg.  He was d/c'd home from ED and was scheduled to see me in the office yesterday.  Unfortunately, when he woke up yesterday, he felt very weak and nauseated with some abd cramping.  This was worse than usual and so he presented back to the ED for eval.  Again, ecg was non-acute and CE neg.  He again noted a brief episode of 1/10 "sensation" in his left upper chest and also a brief period of dyspnea while in the ED.  He was admitted for further eval. He has had no further chest discomfort or dyspnea.  He remains mildly nauseated and is currently NPO for scheduled myoview.  Inpatient Medications  . aspirin EC  325 mg Oral Daily  . atorvastatin  40 mg Oral Daily  . dapsone  100  mg Oral Daily  . enoxaparin (LOVENOX) injection  40 mg Subcutaneous Q24H  . pantoprazole  40 mg Oral Daily  . tamsulosin  0.4 mg Oral Daily    Family History Family History  Problem Relation Age of Onset  . Other      no premature CAD.     Social History History   Social History  . Marital Status: Widowed    Spouse Name: N/A  . Number of Children: N/A  . Years of Education: N/A   Occupational History  . Not on file.   Social History Main Topics  . Smoking status: Never Smoker   . Smokeless tobacco: Never Used  . Alcohol Use: No  . Drug Use: No  . Sexual Activity: Not on file   Other Topics Concern  . Not on file   Social History Narrative   Lives locally with son.  Fairly active around the house though does not routinely exercise.  Retired from [employer].     Review of Systems  General:  No chills,  fever, night sweats or weight changes.  Cardiovascular:  +++ left chest "sensation", no dyspnea on exertion, edema, orthopnea, palpitations, paroxysmal nocturnal dyspnea. Dermatological: No rash, lesions/masses Respiratory: No cough, dyspnea Urologic: No hematuria, dysuria Abdominal:   +++ long h/o nausea and GI cramping w/o vomiting, diarrhea, bright red blood per rectum, melena, or hematemesis Neurologic:  No visual changes, +++ 3wk h/o wkns, no changes in mental status. All other systems reviewed and are otherwise negative except as noted above.  Physical Exam  Blood pressure 99/38, pulse 59, temperature 98.3 F (36.8 C), temperature source Oral, resp. rate 18, height 5\' 8"  (1.727 m), weight 153 lb 1.6 oz (69.446 kg), SpO2 88 %.  General: Pleasant, NAD Psych: Normal affect. Neuro: Alert and oriented X 3. Moves all extremities spontaneously. HEENT: Normal  Neck: Supple without bruits or JVD. Lungs:  Resp regular and unlabored, CTA. Heart: RRR no s3, s4, or murmurs. Abdomen: Soft, non-tender, non-distended, BS + x 4.  Extremities: No clubbing, cyanosis or edema. DP/PT/Radials 2+ and equal bilaterally.  Labs   Recent Labs  01/12/15 1334 01/12/15 1714 01/12/15 2227 01/13/15 0509  TROPONINI <0.03 <0.03 <0.03 <0.03   Lab Results  Component Value Date   WBC 2.5* 01/13/2015   HGB 10.1* 01/13/2015   HCT 29.7* 01/13/2015   MCV 113.6* 01/13/2015   PLT 120* 01/13/2015     Recent Labs Lab 01/12/15 1334 01/13/15 0509  NA 141 141  K 3.6 3.5  CL 109 109  CO2 26 27  BUN 13 15  CREATININE 1.24 1.48*  CALCIUM 9.0 8.7*  PROT 6.4*  --   BILITOT 1.4*  --   ALKPHOS 50  --   ALT 9*  --   AST 23  --   GLUCOSE 101* 95   Lab Results  Component Value Date   CHOL 85 01/13/2015   HDL 36* 01/13/2015   LDLCALC 37 01/13/2015   TRIG 62 01/13/2015   Radiology/Studies  Dg Chest 2 View  01/12/2015   CLINICAL DATA:  Left-sided chest pain  EXAM: CHEST - 2 VIEW  COMPARISON:   01/10/2015  FINDINGS: Cardiac shadow is stable. Postsurgical changes are again noted. The lungs are clear bilaterally. No focal infiltrate is noted. No acute bony abnormality is seen.  IMPRESSION: No acute abnormality noted.   Electronically Signed   By: Inez Catalina M.D.   On: 01/12/2015 13:32   Dg Chest 2 View  01/10/2015  CLINICAL DATA:  81 year old male with chest pain weakness and nausea since last night. Initial encounter.  EXAM: CHEST  2 VIEW  COMPARISON:  09/01/2014 and earlier.  FINDINGS: Stable sequelae of CABG. Normal cardiac size and mediastinal contours. Lung volumes are stable and within normal limits. The lungs remain clear. No pneumothorax or effusion. No acute osseous abnormality identified.  IMPRESSION: No acute cardiopulmonary abnormality.   Electronically Signed   By: Genevie Ann M.D.   On: 01/10/2015 13:35   ECG  Rsr, 66, rbbb, no acute st/t changes.  ASSESSMENT AND PLAN  1.  Weakness/Left chest discomfort/CAD: s/p CABG in 2000 with neg MV in 2010.  He has been experiencing nausea and queasiness with GI cramping dating back to last summer with GI eval in 04/2014.  Ss have only marginally improved w/ PPI and H2 blocker therapy.  Over the past 3 wks, he has had profound wkns and low energy state, which is unusual for him.  He presented to the ED twice this week b/c of ongoing wkns and nausea and reported mild chest discomfort or a 1/10 "sensation" on both occasions.  Despite prolonged Ss, ECG is non-acute and troponin has been normal.  He denies c/p this AM.  Prior anginal equiv was more typical exertional c/p, and he has not had anything similar to what he remembers prior to his CABG in 2000.  He is scheduled for a lexiscan MV this AM.  I will order an echo to assess LV fxn and assess RV - could GI Ss and wkns be any way related to low output (no evidence of volume overload on exam/no murmurs)? Cont ASA (reduce to 81 mg daily), statin.  No bb in setting of h/o asymptomatic bradycardia.  If  ischemic eval neg - rec GI and heme/onc f/u.  2.  HL:  LDL 37.  LFT's wnl.  Cont statin therapy.  3.  Hairy Cell Leukemia/pancytopenia:  Stable.  Followed by heme/onc as an outpt.  Signed, Murray Hodgkins, NP 01/13/2015, 8:35 AM

## 2015-01-13 NOTE — Progress Notes (Signed)
Made patient NPO for Myoview ordered by MD Gouru

## 2015-01-13 NOTE — Progress Notes (Signed)
Pt discharged to home after stress, echo, and CT of chest normal, pt got O2 PRN for home, PER Arida he will arrange a holter monitor outpatient.  Left hospital with his son

## 2015-01-13 NOTE — Evaluation (Signed)
Physical Therapy Evaluation Patient Details Name: Frank Mcdaniel MRN: [id number] DOB: [birth year] Today's Date: 01/13/2015   History of Present Illness  presented to ER with L-sided chest pain x4 days; admitted under observation for cardiac work-up.  Per chart, EKG negative for ischemic changes, troponins negative; myoview stress test completed this date without immediate complications (cleared for activity per RN).  Of note, patient with similar presentation to ER 2 days prior to this episode; work-up negative and patient discharge home with outpatient follow up.    Clinical Impression  Upon evaluation, patient alert and oriented, follow all commands and demonstrates good insight/safety awareness.  Strength and ROM grossly WFL and symmetrical; no focal weakness.  Able to complete bed mobility indep; sit/stand, basic transfers and gait (220') without assist device, cga/close sup.  No overt LOB, but mild decrease in gait speed noted (10' walk in 6 seconds), indicative of increased fall risk with higher-level balance activities. Patient without reports of chest pain or palpitations during session.  HR elevates to from 70s to 90s with activity, but recovers to baseline with seated rest.  Does desat to 85% on RA with exertion, requiring application of 2L supplemental O2 (per RN) for recovery and maintainence at 89-90% remainder of session. Would benefit from skilled PT to address above deficits and promote optimal return to PLOF; will follow remainder of time during hospitalization to address higher-level balance and cardiopulmonary deficits.  Do not anticipate need for skilled PT post-discharge.    Follow Up Recommendations No PT follow up    Equipment Recommendations       Recommendations for Other Services  (outpatient cardiac rehab)     Precautions / Restrictions Precautions Precautions: Fall Restrictions Weight Bearing Restrictions: No      Mobility  Bed Mobility Overal bed  mobility: Independent                Transfers Overall transfer level: Needs assistance Equipment used: None Transfers: Sit to/from Stand Sit to Stand: Supervision            Ambulation/Gait Ambulation/Gait assistance: Supervision Ambulation Distance (Feet): 220 Feet Assistive device: None       General Gait Details: reciprocal stepping with good, symmetrical step height and length.  Fair speed and cadence without overt LOB.  Denies chest pain, palpitation with activity.  HR elevates to 90s with activity, but recovers to baseline with seated rest.  Does desat to 85% on RA with exertion, requiring application of 2L supplemental O2 (per RN) for recovery and maintainence at 89-90% remainder of session.  Stairs            Wheelchair Mobility    Modified Rankin (Stroke Patients Only)       Balance Overall balance assessment: Needs assistance Sitting-balance support: No upper extremity supported;Feet supported Sitting balance-Leahy Scale: Normal     Standing balance support: No upper extremity supported Standing balance-Leahy Scale: Good                               Pertinent Vitals/Pain Pain Assessment: No/denies pain    Home Living Family/patient expects to be discharged to:: Private residence Living Arrangements: Children Available Help at Discharge: Family Type of Home: House Home Access: Stairs to enter   [employer] of Steps: 3 from main level; full flight from basement/garage Home Layout: Two level Home Equipment: None      Prior Function Level of Independence: Independent  Comments: Indep with household/community mobilization; + driving     Hand Dominance        Extremity/Trunk Assessment   Upper Extremity Assessment: Overall WFL for tasks assessed           Lower Extremity Assessment: Overall WFL for tasks assessed         Communication   Communication: No difficulties  Cognition  Arousal/Alertness: Awake/alert Behavior During Therapy: WFL for tasks assessed/performed Overall Cognitive Status: Within Functional Limits for tasks assessed                      General Comments      Exercises        Assessment/Plan    PT Assessment Patient needs continued PT services  PT Diagnosis Generalized weakness   PT Problem List Cardiopulmonary status limiting activity;Decreased balance  PT Treatment Interventions Gait training;Therapeutic activities;Therapeutic exercise;Functional mobility training;Balance training;Patient/family education   PT Goals (Current goals can be found in the Care Plan section) Acute Rehab PT Goals Patient Stated Goal: "to go home today" PT Goal Formulation: With patient/family Time For Goal Achievement: 01/27/15 Potential to Achieve Goals: Good    Frequency Min 2X/week   Barriers to discharge        Co-evaluation               End of Session Equipment Utilized During Treatment: Gait belt Activity Tolerance: Patient tolerated treatment well Patient left: in bed;with call bell/phone within reach;with bed alarm set;with family/visitor present Nurse Communication: Mobility status (O2 response to activity)    Functional Assessment Tool Used: 10' walk test Functional Limitation: Mobility: Walking and moving around Mobility: Walking and Moving Around Current Status [phone]): At least 1 percent but less than 20 percent impaired, limited or restricted Mobility: Walking and Moving Around Goal Status [phone]): 0 percent impaired, limited or restricted    Time: 1140-1203 PT Time Calculation (min) (ACUTE ONLY): 23 min   Charges:   PT Evaluation $Initial PT Evaluation Tier I: 1 Procedure PT Treatments $Gait Training: 8-22 mins   PT G Codes:   PT G-Codes **NOT FOR INPATIENT CLASS** Functional Assessment Tool Used: 10' walk test Functional Limitation: Mobility: Walking and moving around Mobility: Walking and Moving Around  Current Status (E3329): At least 1 percent but less than 20 percent impaired, limited or restricted Mobility: Walking and Moving Around Goal Status [phone]): 0 percent impaired, limited or restricted    Terisha Losasso H. Owens Shark, PT, DPT 01/13/2015, 1:37 PM [phone]

## 2015-01-16 ENCOUNTER — Other Ambulatory Visit: Payer: Self-pay | Admitting: Internal Medicine

## 2015-01-16 NOTE — Progress Notes (Signed)
[facility]  [phone] [phone]  ID: Frank Mcdaniel [place] OB: [birth year]  MR#: [id number]  SFK#:[id number]  Patient Care Team: Morton Peters., MD as PCP - General (Unknown Physician Specialty)  CHIEF COMPLAINT:  Chief Complaint  Patient presents with  . Follow-up    Pt states that he's been feeling more "run down" than usual; and gets queasy around dinnertime...    INTERVAL HISTORY: Patient returns to clinic today for routine follow-up and laboratory work. He feels more weak and fatigued than usual. He also gets occasional nausea particularly later in the day. He denies any fevers. He has no neurologic complaints. He denies any chest pain or shortness of breath. He denies any weight loss. He has no nausea, vomiting, cons patient, or diarrhea. He has no urinary complaints. Patient otherwise feels well and offers no further specific complaints.   REVIEW OF SYSTEMS:   Review of Systems  Constitutional: Positive for malaise/fatigue. Negative for fever.  Respiratory: Negative.   Cardiovascular: Negative.   Gastrointestinal: Positive for nausea.  Musculoskeletal: Negative.   Neurological: Positive for weakness.    As per HPI. Otherwise, a complete review of systems is negatve.  PAST MEDICAL HISTORY: Past Medical History  Diagnosis Date  . Coronary artery disease     a. 2000 s/p CABG;  b. 10/2008 Neg MV, EF 64%.  [name] BPH (benign prostatic hyperplasia)   . Hyperlipidemia   . Leukemia   . H/O echocardiogram     a. 10/2005 Echo: nl EF.  [name] Atypical chest pain     a. 08/2014  . Asymptomatic Sinus Bradycardia   . Asthma   . Hypertension     PAST SURGICAL HISTORY: Past Surgical History  Procedure Laterality Date  . Coronary artery bypass graft  2000  . Cataract extraction      FAMILY HISTORY Family History  Problem Relation Age of Onset  . Other      no premature CAD.       ADVANCED DIRECTIVES:    HEALTH  MAINTENANCE: History  Substance Use Topics  . Smoking status: Never Smoker   . Smokeless tobacco: Never Used  . Alcohol Use: No     Colonoscopy:  PAP:  Bone density:  Lipid panel:  No Known Allergies  Current Outpatient Prescriptions  Medication Sig Dispense Refill  . acyclovir (ZOVIRAX) 400 MG tablet Take 1 tablet (400 mg total) by mouth 2 (two) times daily. 60 tablet 2  . aspirin 81 MG EC tablet Take 2 tablets (162 mg total) by mouth daily. 30 tablet 0  . Multiple Vitamin (MULTIVITAMIN) tablet Take 1 tablet by mouth daily.      [name] atorvastatin (LIPITOR) 40 MG tablet Take 20 mg by mouth daily.     . dapsone 100 MG tablet TAKE 1 TABLET BY MOUTH EVERY DAY 30 tablet 3  . pantoprazole (PROTONIX) 40 MG tablet Take 40 mg by mouth daily.    . ranitidine (ZANTAC) 150 MG capsule Take 150 mg by mouth 2 (two) times daily.    . tamsulosin (FLOMAX) 0.4 MG CAPS capsule Take 0.4 mg by mouth daily.     No current facility-administered medications for this visit.    OBJECTIVE: Filed Vitals:   12/29/14 1527  BP: 168/82  Pulse: 62  Temp: 98 F (36.7 C)  Resp: 16     Body mass index is 25.28 kg/(m^2).    ECOG FS:1 - Symptomatic but completely ambulatory  General: Well-developed, well-nourished, no acute distress.  Eyes: anicteric sclera. Lungs: Clear to auscultation bilaterally. Heart: Regular rate and rhythm. No rubs, murmurs, or gallops. Abdomen: Soft, nontender, nondistended. No organomegaly noted, normoactive bowel sounds. Musculoskeletal: No edema, cyanosis, or clubbing. Neuro: Alert, answering all questions appropriately. Cranial nerves grossly intact. Skin: No rashes or petechiae noted. Psych: Normal affect.   LAB RESULTS:  Lab Results  Component Value Date   NA 141 01/13/2015   K 3.5 01/13/2015   CL 109 01/13/2015   CO2 27 01/13/2015   GLUCOSE 95 01/13/2015   BUN 15 01/13/2015   CREATININE 1.48* 01/13/2015   CALCIUM 8.7* 01/13/2015   PROT 6.4* 01/12/2015   ALBUMIN  4.2 01/12/2015   AST 23 01/12/2015   ALT 9* 01/12/2015   ALKPHOS 50 01/12/2015   BILITOT 1.4* 01/12/2015   GFRNONAA 43* 01/13/2015   GFRAA 50* 01/13/2015    Lab Results  Component Value Date   WBC 2.5* 01/13/2015   NEUTROABS 2.1 12/29/2014   HGB 10.1* 01/13/2015   HCT 29.7* 01/13/2015   MCV 113.6* 01/13/2015   PLT 120* 01/13/2015     STUDIES: Dg Chest 2 View  01/12/2015   CLINICAL DATA:  Left-sided chest pain  EXAM: CHEST - 2 VIEW  COMPARISON:  01/10/2015  FINDINGS: Cardiac shadow is stable. Postsurgical changes are again noted. The lungs are clear bilaterally. No focal infiltrate is noted. No acute bony abnormality is seen.  IMPRESSION: No acute abnormality noted.   Electronically Signed   By: Inez Catalina M.D.   On: 01/12/2015 13:32   Dg Chest 2 View  01/10/2015   CLINICAL DATA:  81 year old male with chest pain weakness and nausea since last night. Initial encounter.  EXAM: CHEST  2 VIEW  COMPARISON:  09/01/2014 and earlier.  FINDINGS: Stable sequelae of CABG. Normal cardiac size and mediastinal contours. Lung volumes are stable and within normal limits. The lungs remain clear. No pneumothorax or effusion. No acute osseous abnormality identified.  IMPRESSION: No acute cardiopulmonary abnormality.   Electronically Signed   By: Genevie Ann M.D.   On: 01/10/2015 13:35   Ct Angio Chest Pe W/cm &/or Wo Cm  01/13/2015   CLINICAL DATA:  Weakness and shortness of Breath  EXAM: CT ANGIOGRAPHY CHEST WITH CONTRAST  TECHNIQUE: Multidetector CT imaging of the chest was performed using the standard protocol during bolus administration of intravenous contrast. Multiplanar CT image reconstructions and MIPs were obtained to evaluate the vascular anatomy.  CONTRAST:  56mL OMNIPAQUE IOHEXOL 350 MG/ML SOLN  COMPARISON:  07/22/2014  FINDINGS: Lungs are well aerated bilaterally. No focal infiltrate or sizable effusion is noted. A small 4 mm pleural-based nodule is noted along the major fissure within the left  upper lobe stable from the prior exam. It is been stable on multiple previous exams consistent with a benign etiology. Calcified granuloma is again seen in the right upper lobe also stable from the prior exam.  The thoracic inlet is within normal limits. The left vertebral artery arises directly from the aorta. Mild atherosclerotic calcifications are seen without aneurysmal dilatation or dissection. Changes of prior coronary bypass grafting are noted. The pulmonary artery is well visualized and demonstrates a normal branching pattern. No filling defect to suggest pulmonary embolism is identified. No significant hilar or mediastinal adenopathy is noted. Visualized upper abdomen demonstrates a large gallstones stable from the prior study. No acute bony abnormality is seen.  Review of the MIP images confirms the above findings.  IMPRESSION: Stable cholelithiasis.  Stable nodules within the lungs bilaterally  consistent with benign etiology.  No pulmonary embolism is seen.   Electronically Signed   By: Inez Catalina M.D.   On: 01/13/2015 14:54   Nm Myocar Multi W/spect W/wall Motion / Ef  01/13/2015    Downsloping ST segment depression ST segment depression was noted during  stress, beginning at 1 minutes of stress.  T wave inversion was noted during stress, beginning at 1 minutes of  stress.  The study is normal.  This is a low risk study.  The left ventricular ejection fraction is normal (55-65%).     ASSESSMENT:  Hairy cell leukemia.  PLAN:   1. Hairy cell leukemia: Patient initially received cladribine in 2006 and then was treated for a relapse in 2011 and again in 2014. He continues to have pancytopenia which is relatively stable. Consider repeat peripheral blood flow cytometry in the near future. Patient previously required irradiated blood products. No intervention is needed at this time. Return to clinic in 3 months with repeat laboratory work and further evaluation. 2. Pancytopenia: Stable. No  intervention needed.  Approximately 30 minutes was spent in discussion and consultation.   Patient expressed understanding and was in agreement with this plan. He also understands that He can call clinic at any time with any questions, concerns, or complaints.   No matching staging information was found for the patient.  [name], MD   01/16/2015 12:34 PM

## 2015-01-23 NOTE — Telephone Encounter (Signed)
This encounter was created in error - please disregard.

## 2015-01-26 ENCOUNTER — Telehealth: Payer: Self-pay | Admitting: Cardiovascular Disease

## 2015-01-26 NOTE — Telephone Encounter (Signed)
Left message for pt that I had reviewed his chart and needs to f/u w/ pulmonology.  Asked him to call back if he would like [place] to place a referral.

## 2015-01-26 NOTE — Telephone Encounter (Signed)
Patient recenty dc from [facility] and says his o2 runs 85%.  Patient has o2 at home that he had to pay out of pocket for because medicare did not have needed documentation.   Is Angina and fatigue related to oxygen dropping ? please call  Patient.

## 2015-01-27 NOTE — Care Management (Signed)
Made patient an appointment with Dr Vella Kohler July 20 at 11 o'clock.  Informed Patient's son [name]

## 2015-01-28 ENCOUNTER — Inpatient Hospital Stay
Admission: EM | Admit: 2015-01-28 | Discharge: 2015-01-30 | DRG: 189 | Disposition: A | Discharge status: Home / Self Care | Payer: Medicare PPO | Attending: Internal Medicine | Admitting: Internal Medicine

## 2015-01-28 ENCOUNTER — Encounter: Payer: Self-pay | Admitting: Internal Medicine

## 2015-01-28 ENCOUNTER — Emergency Department: Payer: Medicare PPO

## 2015-01-28 DIAGNOSIS — D649 Anemia, unspecified: Secondary | ICD-10-CM | POA: Diagnosis not present

## 2015-01-28 DIAGNOSIS — I10 Essential (primary) hypertension: Secondary | ICD-10-CM | POA: Diagnosis present

## 2015-01-28 DIAGNOSIS — J9601 Acute respiratory failure with hypoxia: Principal | ICD-10-CM | POA: Diagnosis present

## 2015-01-28 DIAGNOSIS — N400 Enlarged prostate without lower urinary tract symptoms: Secondary | ICD-10-CM | POA: Diagnosis present

## 2015-01-28 DIAGNOSIS — Z9981 Dependence on supplemental oxygen: Secondary | ICD-10-CM

## 2015-01-28 DIAGNOSIS — R001 Bradycardia, unspecified: Secondary | ICD-10-CM | POA: Diagnosis present

## 2015-01-28 DIAGNOSIS — K219 Gastro-esophageal reflux disease without esophagitis: Secondary | ICD-10-CM | POA: Diagnosis present

## 2015-01-28 DIAGNOSIS — N183 Chronic kidney disease, stage 3 (moderate): Secondary | ICD-10-CM | POA: Diagnosis present

## 2015-01-28 DIAGNOSIS — J45909 Unspecified asthma, uncomplicated: Secondary | ICD-10-CM | POA: Diagnosis present

## 2015-01-28 DIAGNOSIS — E785 Hyperlipidemia, unspecified: Secondary | ICD-10-CM | POA: Diagnosis present

## 2015-01-28 DIAGNOSIS — Z7982 Long term (current) use of aspirin: Secondary | ICD-10-CM | POA: Diagnosis not present

## 2015-01-28 DIAGNOSIS — Z951 Presence of aortocoronary bypass graft: Secondary | ICD-10-CM

## 2015-01-28 DIAGNOSIS — C9140 Hairy cell leukemia not having achieved remission: Secondary | ICD-10-CM | POA: Diagnosis present

## 2015-01-28 DIAGNOSIS — D539 Nutritional anemia, unspecified: Secondary | ICD-10-CM | POA: Diagnosis present

## 2015-01-28 DIAGNOSIS — I129 Hypertensive chronic kidney disease with stage 1 through stage 4 chronic kidney disease, or unspecified chronic kidney disease: Secondary | ICD-10-CM | POA: Diagnosis present

## 2015-01-28 DIAGNOSIS — R079 Chest pain, unspecified: Secondary | ICD-10-CM | POA: Diagnosis present

## 2015-01-28 DIAGNOSIS — I2510 Atherosclerotic heart disease of native coronary artery without angina pectoris: Secondary | ICD-10-CM | POA: Diagnosis present

## 2015-01-28 DIAGNOSIS — R918 Other nonspecific abnormal finding of lung field: Secondary | ICD-10-CM | POA: Diagnosis present

## 2015-01-28 DIAGNOSIS — R531 Weakness: Secondary | ICD-10-CM | POA: Diagnosis not present

## 2015-01-28 DIAGNOSIS — R110 Nausea: Secondary | ICD-10-CM | POA: Diagnosis present

## 2015-01-28 DIAGNOSIS — R0789 Other chest pain: Secondary | ICD-10-CM

## 2015-01-28 HISTORY — DX: Chronic kidney disease, stage 3 (moderate): N18.3

## 2015-01-28 HISTORY — DX: Essential (primary) hypertension: I10

## 2015-01-28 HISTORY — DX: Gastro-esophageal reflux disease without esophagitis: K21.9

## 2015-01-28 HISTORY — DX: Chronic kidney disease, stage 3 unspecified: N18.30

## 2015-01-28 LAB — BASIC METABOLIC PANEL
Anion gap: 9 (ref 5–15)
BUN: 15 mg/dL (ref 6–20)
CO2: 25 mmol/L (ref 22–32)
Calcium: 8.9 mg/dL (ref 8.9–10.3)
Chloride: 106 mmol/L (ref 101–111)
Creatinine, Ser: 1.44 mg/dL — ABNORMAL HIGH (ref 0.61–1.24)
GFR calc Af Amer: 51 mL/min — ABNORMAL LOW (ref >60)
GFR calc non Af Amer: 44 mL/min — ABNORMAL LOW (ref >60)
Glucose, Bld: 119 mg/dL — ABNORMAL HIGH (ref 65–99)
Potassium: 3.2 mmol/L — ABNORMAL LOW (ref 3.5–5.1)
Sodium: 140 mmol/L (ref 135–145)

## 2015-01-28 LAB — CBC
HCT: 28.8 % — ABNORMAL LOW (ref 40.0–52.0)
Hemoglobin: 9.4 g/dL — ABNORMAL LOW (ref 13.0–18.0)
MCH: 38.5 pg — ABNORMAL HIGH (ref 26.0–34.0)
MCHC: 32.9 g/dL (ref 32.0–36.0)
MCV: 117.2 fL — AB (ref 80.0–100.0)
Platelets: 119 10*3/uL — ABNORMAL LOW (ref 150–440)
RBC: 2.45 MIL/uL — AB (ref 4.40–5.90)
RDW: 16.3 % — ABNORMAL HIGH (ref 11.5–14.5)
WBC: 2.2 10*3/uL — ABNORMAL LOW (ref 3.8–10.6)

## 2015-01-28 LAB — TROPONIN I: Troponin I: <0.03 ng/mL (ref <0.031)

## 2015-01-28 MED ORDER — PANTOPRAZOLE SODIUM 40 MG PO TBEC
40.0000 mg | DELAYED_RELEASE_TABLET | Freq: Every day | ORAL | Status: DC
  Administered 2015-01-29 – 2015-01-30 (×2): 40 mg via ORAL
  Filled 2015-01-28 (×2): qty 1

## 2015-01-28 MED ORDER — IPRATROPIUM-ALBUTEROL 0.5-2.5 (3) MG/3ML IN SOLN: 3.0000 mL | RESPIRATORY_TRACT | Status: DC | PRN

## 2015-01-28 MED ORDER — ATORVASTATIN CALCIUM 20 MG PO TABS
20.0000 mg | ORAL_TABLET | Freq: Every day | ORAL | Status: DC
  Administered 2015-01-29 – 2015-01-30 (×2): 20 mg via ORAL
  Filled 2015-01-28 (×2): qty 1

## 2015-01-28 MED ORDER — IPRATROPIUM-ALBUTEROL 0.5-2.5 (3) MG/3ML IN SOLN
RESPIRATORY_TRACT | Status: AC
  Administered 2015-01-28: 3 mL via RESPIRATORY_TRACT
  Filled 2015-01-28: qty 3

## 2015-01-28 MED ORDER — ACETAMINOPHEN 325 MG PO TABS: 650.0000 mg | ORAL_TABLET | Freq: Four times a day (QID) | ORAL | Status: DC | PRN

## 2015-01-28 MED ORDER — ONDANSETRON HCL 4 MG/2ML IJ SOLN
4.0000 mg | Freq: Four times a day (QID) | INTRAMUSCULAR | Status: DC | PRN
  Administered 2015-01-29: 4 mg via INTRAVENOUS
  Filled 2015-01-28: qty 2

## 2015-01-28 MED ORDER — IPRATROPIUM-ALBUTEROL 0.5-2.5 (3) MG/3ML IN SOLN
3.0000 mL | Freq: Once | RESPIRATORY_TRACT | Status: AC
  Administered 2015-01-28: 3 mL via RESPIRATORY_TRACT

## 2015-01-28 MED ORDER — ASPIRIN 81 MG PO TBEC: 162.0000 mg | DELAYED_RELEASE_TABLET | Freq: Every day | ORAL | Status: DC

## 2015-01-28 MED ORDER — SODIUM CHLORIDE 0.9 % IJ SOLN
3.0000 mL | Freq: Two times a day (BID) | INTRAMUSCULAR | Status: DC
  Administered 2015-01-29 – 2015-01-30 (×4): 3 mL via INTRAVENOUS

## 2015-01-28 MED ORDER — TAMSULOSIN HCL 0.4 MG PO CAPS
0.4000 mg | ORAL_CAPSULE | Freq: Every day | ORAL | Status: DC
  Administered 2015-01-29 – 2015-01-30 (×2): 0.4 mg via ORAL
  Filled 2015-01-28 (×2): qty 1

## 2015-01-28 MED ORDER — ACYCLOVIR 200 MG PO CAPS
400.0000 mg | ORAL_CAPSULE | Freq: Two times a day (BID) | ORAL | Status: DC
  Administered 2015-01-29 – 2015-01-30 (×4): 400 mg via ORAL
  Filled 2015-01-28 (×6): qty 2

## 2015-01-28 MED ORDER — METHYLPREDNISOLONE SODIUM SUCC 125 MG IJ SOLR
60.0000 mg | Freq: Once | INTRAMUSCULAR | Status: AC
  Administered 2015-01-28: 60 mg via INTRAVENOUS

## 2015-01-28 MED ORDER — ONDANSETRON HCL 4 MG PO TABS: 4.0000 mg | ORAL_TABLET | Freq: Four times a day (QID) | ORAL | Status: DC | PRN

## 2015-01-28 MED ORDER — METHYLPREDNISOLONE SODIUM SUCC 125 MG IJ SOLR
INTRAMUSCULAR | Status: AC
  Administered 2015-01-28: 60 mg via INTRAVENOUS
  Filled 2015-01-28: qty 2

## 2015-01-28 MED ORDER — ACETAMINOPHEN 650 MG RE SUPP: 650.0000 mg | Freq: Four times a day (QID) | RECTAL | Status: DC | PRN

## 2015-01-28 MED ORDER — ENOXAPARIN SODIUM 40 MG/0.4ML ~~LOC~~ SOLN
40.0000 mg | SUBCUTANEOUS | Status: DC
  Administered 2015-01-29 (×2): 40 mg via SUBCUTANEOUS
  Filled 2015-01-28 (×2): qty 0.4

## 2015-01-28 MED ORDER — SENNOSIDES-DOCUSATE SODIUM 8.6-50 MG PO TABS: 1.0000 | ORAL_TABLET | Freq: Every evening | ORAL | Status: DC | PRN

## 2015-01-28 NOTE — ED Notes (Signed)
Pt placed on oxygen at 2lpm via [place] on arrival to room for pox on ra of 87%. Pt's son speaking over pt loudly while this RN attempting to assess. Pt's son informed to please refrain from speaking over pt while this RN assessing due to difficulty hearing pt's comments.

## 2015-01-28 NOTE — ED Notes (Signed)
hospitalist in to see pt.

## 2015-01-28 NOTE — ED Notes (Signed)
Pt up to void.

## 2015-01-28 NOTE — ED Provider Notes (Signed)
[facility] Emergency Department Provider Note  ____________________________________________  Time seen:  9 PM  I have reviewed the triage vital signs and the nursing notes.   HISTORY  Chief Complaint Chest Pain and Nausea     HPI Frank Mcdaniel is a 81 y.o. male with a history of cardiac disease and coronary bypass in 2000 and leukemia that was treated in 2006 and again in 2011. He presents today with chest discomfort and weakness and not feeling well that began approximately 4:00 today. 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 He reports a second troponin training started feeling weak and not well. This is similar to when he came to the hospital on June 7 & June 9. He was admitted on June 9 and had a more complete workup which included a CT scan to rule out PE and cardiac testing by cardiology.  During his hospitalization he was noted to be somewhat hypoxic. He was started on oxygen at home when necessary. He presents today with a little bit of a low O2 sat level.   Past Medical History  Diagnosis Date  . Coronary artery disease     a. 2000 s/p CABG;  b. 10/2008 Neg MV, EF 64%.  [name] BPH (benign prostatic hyperplasia)   . Hyperlipidemia   . Leukemia   . H/O echocardiogram     a. 10/2005 Echo: nl EF.  [name] Atypical chest pain     a. 08/2014  . Asymptomatic Sinus Bradycardia   . Asthma   . Hypertension   . GERD (gastroesophageal reflux disease)   . HTN (hypertension)   . CKD (chronic kidney disease), stage III     Patient Active Problem List   Diagnosis Date Noted  . Asthma 01/28/2015  . Acute respiratory failure with hypoxemia 01/28/2015  . BPH (benign prostatic hyperplasia) 01/28/2015  . Hairy cell leukemia 01/28/2015  . Coronary artery disease   . Chest pain 09/02/2014  . S/P CABG (coronary artery bypass graft) 11/08/2010  . HTN (hypertension) 11/08/2010  . Bradycardia 11/08/2010  . Hyperlipidemia 10/27/2009    Past Surgical History  Procedure  Laterality Date  . Coronary artery bypass graft  2000  . Cataract extraction      No current outpatient prescriptions on file.  Allergies Review of patient's allergies indicates no known allergies.  Family History  Problem Relation Age of Onset  . Other      no premature CAD.    Social History History  Substance Use Topics  . Smoking status: Never Smoker   . Smokeless tobacco: Never Used  . Alcohol Use: No    Review of Systems Constitutional: Negative for fever. ENT: Negative for sore throat. Cardiovascular: Positive for chest pain. Respiratory: Positive for shortness of breath. See history of present illness Gastrointestinal: Negative for abdominal pain, vomiting and diarrhea. Genitourinary: Negative for dysuria. Musculoskeletal: No myalgias or injuries. Skin: Negative for rash. Neurological: Negative for headaches   10-point ROS otherwise negative.  ____________________________________________   PHYSICAL EXAM:  VITAL SIGNS: ED Triage Vitals  Enc Vitals Group     BP 01/28/15 1913 142/64 mmHg     Pulse Rate 01/28/15 1913 73     Resp 01/28/15 1913 18     Temp 01/28/15 1913 98.5 F (36.9 C)     Temp Source 01/28/15 1913 Oral     SpO2 01/28/15 1913 91 %     Weight 01/28/15 1913 160 lb (72.576 kg)     Height 01/28/15 1913 5'  8" (1.727 m)     Head Cir --      Peak Flow --      Pain Score 01/28/15 1911 1     Pain Loc --      Pain Edu? --      Excl. in [place]? --     Constitutional:  Alert and oriented. Mildly hypoxic with mild increase work of breathing but otherwise no acute distress ENT   Head: Normocephalic and atraumatic.   Nose: No congestion/rhinnorhea.   Mouth/Throat: Mucous membranes are moist. Cardiovascular: Normal rate, regular rhythm, no murmur noted Respiratory:  Normal respiratory effort, no tachypnea.    Breath sounds are clear and equal bilaterally.  Gastrointestinal: Soft and nontender. No distention.  Back: No muscle spasm, no  tenderness, no CVA tenderness. Musculoskeletal: No deformity noted. Nontender with normal range of motion in all extremities.  No noted edema. Neurologic:  Normal speech and language. No gross focal neurologic deficits are appreciated.  Skin:  Skin is warm, dry. No rash noted. Psychiatric: Mood and affect are normal. Speech and behavior are normal.  ____________________________________________    LABS (pertinent positives/negatives)  Labs Reviewed  CBC - Abnormal; Notable for the following:    WBC 2.2 (*)    RBC 2.45 (*)    Hemoglobin 9.4 (*)    HCT 28.8 (*)    MCV 117.2 (*)    MCH 38.5 (*)    RDW 16.3 (*)    Platelets 119 (*)    All other components within normal limits  BASIC METABOLIC PANEL - Abnormal; Notable for the following:    Potassium 3.2 (*)    Glucose, Bld 119 (*)    Creatinine, Ser 1.44 (*)    GFR calc non Af Amer 44 (*)    GFR calc Af Amer 51 (*)    All other components within normal limits  TROPONIN I  CBC  CREATININE, SERUM  MAGNESIUM  PHOSPHORUS  BASIC METABOLIC PANEL  CBC     ____________________________________________   EKG  ED ECG REPORT I, Raihan Kimmel W, the attending physician, personally viewed and interpreted this ECG.   Date: 01/28/2015  EKG Time: 1916  Rate: 73  Rhythm: Normal sinus rhythm with right bundle branch block  Axis: -20  Intervals: Normal  ST&T Change: Flipped T-wave in lead 3 and by mobile T-wave in lead V2 and V3. No change from 01/10/2015   ____________________________________________    RADIOLOGY  Chest x-ray: 1. No significant abnormality to explain the patient's chest pain.   ____________________________________________   INITIAL IMPRESSION / ASSESSMENT AND PLAN / ED COURSE  Pertinent labs & imaging results that were available during my care of the patient were reviewed by me and considered in my medical decision making (see chart for details).  Patient is frustrated that he does not know why he is  having these episodes and that no analysis able to tell him either. He has been doing well until the episode today. I have reviewed the records. He had a negative CT for PE earlier this month. "Patient underwent a stress test. His stress test was read as low risk. He also underwent a 2 D ECHO which showed a normal EF and no major valvular issues. His cardiac markers were negative. Telemetry showed no acute EKG changes".  I have turned the patient's oxygen level off for further assessment. At this point the O2 sat dropped into the mid 80s. We will restart the time. Still unclear why this is occurring and  I think that this is an essential part of his symptomology and complaint. We will treat him with site Medrol and a breathing treatment and see consult for admission the hospital.    ____________________________________________   FINAL CLINICAL IMPRESSION(S) / ED DIAGNOSES  Final diagnoses:  Other chest pain  Acute respiratory failure with hypoxemia      Ahmed Prima, MD 01/29/15 [phone]

## 2015-01-28 NOTE — H&P (Signed)
[place] [facility] [place] NAME: Frank Mcdaniel    MR#:  [id number]  DATE OF BIRTH:  1933-03-09  DATE OF ADMISSION:  01/28/2015  PRIMARY CARE PHYSICIAN: Morton Peters, MD   REQUESTING/REFERRING PHYSICIAN: Thomasene Lot  CHIEF COMPLAINT:   Chief Complaint  Patient presents with  . Chest Pain  . Nausea    HISTORY OF PRESENT ILLNESS:  Frank Mcdaniel  is a 81 y.o. male who presents with acute hypoxic risk for a failure. Patient has been recently admitted multiple times for this, and was sent home after his last admission after significant cardiac workup which was negative. He was sent home with oxygen at that time, and given follow-up with cardiology and pulmonology (who actually did not see him while he was inpatient). He returns today after significant acute shortness of breath again and had O2 sats in the low 80s on arrival to the ED. This improved somewhat with supplemental oxygen. Initial evaluation in the ED is largely negative as far as workup goes. Negative cardiac enzymes, chest x-ray does not show any focal infiltrate fluid or effusion. During his last hospital stay he had CT scan which is largely negative as well, it was a CTA chest ruling out PE, but also did not show any other significant pathology except for some pulmonary nodules. Of note the patient does have a history of hairy cell leukemia, he states he has not been on any chemotherapy for this for the last couple years. His white blood cell count is low today, and on chart review appears it has been low for the last several months, he also has chronic anemia, though not extreme, with a hemoglobin that ranges between 9 and 11, and is 9.4 today. Patient denies infectious symptoms, see review of systems below. Hospitalists were called for workup of his respiratory failure.  PAST MEDICAL HISTORY:   Past Medical History  Diagnosis Date  . Coronary artery disease     a. 2000 s/p  CABG;  b. 10/2008 Neg MV, EF 64%.  [name] BPH (benign prostatic hyperplasia)   . Hyperlipidemia   . Leukemia   . H/O echocardiogram     a. 10/2005 Echo: nl EF.  [name] Atypical chest pain     a. 08/2014  . Asymptomatic Sinus Bradycardia   . Asthma   . Hypertension   . GERD (gastroesophageal reflux disease)   . HTN (hypertension)   . CKD (chronic kidney disease), stage III     PAST SURGICAL HISTORY:   Past Surgical History  Procedure Laterality Date  . Coronary artery bypass graft  2000  . Cataract extraction      SOCIAL HISTORY:   History  Substance Use Topics  . Smoking status: Never Smoker   . Smokeless tobacco: Never Used  . Alcohol Use: No    FAMILY HISTORY:   Family History  Problem Relation Age of Onset  . Other      no premature CAD.    DRUG ALLERGIES:  No Known Allergies  MEDICATIONS AT HOME:   Prior to Admission medications   Medication Sig Start Date End Date Taking? Authorizing Provider  acyclovir (ZOVIRAX) 400 MG tablet Take 1 tablet (400 mg total) by mouth 2 (two) times daily. 12/12/14  Yes Dallas Schimke, MD  aspirin 81 MG EC tablet Take 2 tablets (162 mg total) by mouth daily. 11/08/10  Yes [name], MD  atorvastatin (LIPITOR) 40 MG tablet Take 20 mg  by mouth daily.    Yes Historical Provider, MD  dapsone 100 MG tablet TAKE 1 TABLET BY MOUTH EVERY DAY 01/16/15  Yes [name], MD  Multiple Vitamin (MULTIVITAMIN) tablet Take 1 tablet by mouth daily.     Yes Historical Provider, MD  OXYGEN Inhale 2 L/min into the lungs as needed (for breathing). Oxygen @@ 2 liters via nasal cannula as needed through the day.   Yes Historical Provider, MD  pantoprazole (PROTONIX) 40 MG tablet Take 40 mg by mouth daily.   Yes Historical Provider, MD  ranitidine (ZANTAC) 150 MG capsule Take 150 mg by mouth 2 (two) times daily as needed for heartburn.    Yes Historical Provider, MD  tamsulosin (FLOMAX) 0.4 MG CAPS capsule Take 0.4 mg by mouth daily.   Yes Historical  Provider, MD    REVIEW OF SYSTEMS:  Review of Systems  Constitutional: Negative for fever, chills, weight loss and malaise/fatigue.  HENT: Negative for ear pain, hearing loss and tinnitus.   Eyes: Negative for blurred vision, double vision, pain and redness.  Respiratory: Positive for shortness of breath. Negative for cough and hemoptysis.   Cardiovascular: Positive for chest pain. Negative for palpitations, orthopnea and leg swelling.  Gastrointestinal: Positive for nausea. Negative for vomiting, abdominal pain, diarrhea and constipation.  Genitourinary: Negative for dysuria, frequency and hematuria.  Musculoskeletal: Negative for back pain, joint pain and neck pain.  Skin:       No acne, rash, or lesions  Neurological: Negative for dizziness, tremors, focal weakness and weakness.  Endo/Heme/Allergies: Negative for polydipsia. Does not bruise/bleed easily.  Psychiatric/Behavioral: Negative for depression. The patient is not nervous/anxious and does not have insomnia.      VITAL SIGNS:   Filed Vitals:   01/28/15 2115 01/28/15 2130 01/28/15 2145 01/28/15 2200  BP:  154/63  158/76  Pulse: 61 67 72 72  Temp:      TempSrc:      Resp: 28 15 18 20   Height:      Weight:      SpO2: 91% 92% 92% 92%   Wt Readings from Last 3 Encounters:  01/28/15 72.576 kg (160 lb)  01/13/15 69.446 kg (153 lb 1.6 oz)  01/10/15 75.751 kg (167 lb)    PHYSICAL EXAMINATION:  Physical Exam  Constitutional: He is oriented to person, place, and time. He appears well-developed and well-nourished. No distress.  HENT:  Head: Normocephalic and atraumatic.  Mouth/Throat: Oropharynx is clear and moist.  Eyes: Conjunctivae and EOM are normal. Pupils are equal, round, and reactive to light. No scleral icterus.  Neck: Normal range of motion. Neck supple. No JVD present. No thyromegaly present.  Cardiovascular: Normal rate, regular rhythm and intact distal pulses.  Exam reveals no gallop and no friction rub.    No murmur heard. Respiratory: Effort normal and breath sounds normal. No respiratory distress. He has no wheezes. He has no rales.  GI: Soft. Bowel sounds are normal. He exhibits no distension. There is no tenderness.  Musculoskeletal: Normal range of motion. He exhibits no edema.  No arthritis, no gout  Lymphadenopathy:    He has no cervical adenopathy.  Neurological: He is alert and oriented to person, place, and time. No cranial nerve deficit.  No dysarthria, no aphasia  Skin: Skin is warm and dry. No rash noted. No erythema.  Psychiatric: He has a normal mood and affect. His behavior is normal. Judgment and thought content normal.    LABORATORY PANEL:   CBC  Recent Labs Lab 01/28/15 1923  WBC 2.2*  HGB 9.4*  HCT 28.8*  PLT 119*   ------------------------------------------------------------------------------------------------------------------  Chemistries   Recent Labs Lab 01/28/15 1923  NA 140  K 3.2*  CL 106  CO2 25  GLUCOSE 119*  BUN 15  CREATININE 1.44*  CALCIUM 8.9   ------------------------------------------------------------------------------------------------------------------  Cardiac Enzymes  Recent Labs Lab 01/28/15 1923  TROPONINI <0.03   ------------------------------------------------------------------------------------------------------------------  RADIOLOGY:  Dg Chest Port 1 View  01/28/2015   CLINICAL DATA:  Left chest pain.  EXAM: PORTABLE CHEST - 1 VIEW  COMPARISON:  01/13/2015  FINDINGS: Prior CABG. The lungs appear clear. Heart size within normal limits. No pleural effusion.  IMPRESSION: 1. No significant abnormality to explain the patient's chest pain.   Electronically Signed   By: Van Clines M.D.   On: 01/28/2015 19:52    EKG:   Orders placed or performed during the hospital encounter of 01/28/15  . ED EKG (<68mins upon arrival to the ED)  . ED EKG (<66mins upon arrival to the ED)    IMPRESSION AND PLAN:  Principal  Problem:   Acute respiratory failure with hypoxemia - unclear etiology. Patient states he did have asthma and he is a teenager, which seem to be induced by exposure to allergens, but he has not had any problems at this since his mid teenage years. He is a never smoker, with no prior known history of lung disease. His son is present with him in the ED today and states that the only respiratory issue he had in the past was a couple years ago when he went for colonoscopy and was told that his oxygen saturation was low. Time it seems that his physicians felt that this was due to his chronic anemia, and his son do not know exactly how low the oxygen saturation was at that time. He does have a pulse oximeter home and states that over the last month or so since he's been coming to the hospital for this problem and his pulse oximeter has been reading persistently in the mid 80s, and they expressed hesitancy as to how accurate the machine is. Patient's O2 sats corrected with supplemental oxygen, we will continue this here for now, nebulizers when necessary, consult pulmonology for further recommendation for workup. Active Problems:   Chest pain - resolved when his respiratory distress is alleviated. Continue treatment as above, recent cardiac workup which was extensive was negative, and his troponin is negative here today, no new EKG changes.   Hairy cell leukemia - with his leukopenia, and these respiratory problems, we will get an oncology consult just to see if they have any thoughts as to whether or not his hairy cell leukemia may either be reemerging, and/or may have anything to do this respiratory distress.   HTN (hypertension) - chronic stable problem, continue home meds   Coronary artery disease - chronic problem, extensive negative cardiac workup recently, continue home meds   Hyperlipidemia - chronic stable, continue home meds   BPH (benign prostatic hyperplasia) - continue home Flomax  All the records  are reviewed and case discussed with ED provider. Management plans discussed with the patient and/or family.  DVT PROPHYLAXIS: SubQ lovenox  ADMISSION STATUS: Inpatient  CODE STATUS: Full Advance Directive Documentation        Most Recent Value   Type of Advance Directive  Living will   Pre-existing out of facility DNR order (yellow form or pink MOST form)     "MOST" Form  in Place?        TOTAL TIME TAKING CARE OF THIS PATIENT: 60 minutes.    Providence Stivers Thebes 01/28/2015, 10:30 PM  [facility]  Office  [phone]  CC: Primary care physician; Morton Peters, MD

## 2015-01-28 NOTE — ED Notes (Addendum)
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

## 2015-01-28 NOTE — ED Notes (Signed)
Pt denies pressure at this time, states nausea continues. Pt then states "every now and then i get a twinge of pain in my chest".

## 2015-01-29 DIAGNOSIS — I129 Hypertensive chronic kidney disease with stage 1 through stage 4 chronic kidney disease, or unspecified chronic kidney disease: Secondary | ICD-10-CM

## 2015-01-29 DIAGNOSIS — I2510 Atherosclerotic heart disease of native coronary artery without angina pectoris: Secondary | ICD-10-CM

## 2015-01-29 DIAGNOSIS — J9601 Acute respiratory failure with hypoxia: Principal | ICD-10-CM

## 2015-01-29 DIAGNOSIS — R531 Weakness: Secondary | ICD-10-CM

## 2015-01-29 DIAGNOSIS — N183 Chronic kidney disease, stage 3 (moderate): Secondary | ICD-10-CM

## 2015-01-29 DIAGNOSIS — R5383 Other fatigue: Secondary | ICD-10-CM

## 2015-01-29 DIAGNOSIS — C9140 Hairy cell leukemia not having achieved remission: Secondary | ICD-10-CM

## 2015-01-29 DIAGNOSIS — Z79899 Other long term (current) drug therapy: Secondary | ICD-10-CM

## 2015-01-29 DIAGNOSIS — E785 Hyperlipidemia, unspecified: Secondary | ICD-10-CM

## 2015-01-29 DIAGNOSIS — D649 Anemia, unspecified: Secondary | ICD-10-CM

## 2015-01-29 LAB — MAGNESIUM: Magnesium: 2.2 mg/dL (ref 1.7–2.4)

## 2015-01-29 LAB — CBC
HCT: 28 % — ABNORMAL LOW (ref 40.0–52.0)
Hemoglobin: 9.4 g/dL — ABNORMAL LOW (ref 13.0–18.0)
MCH: 39.4 pg — ABNORMAL HIGH (ref 26.0–34.0)
MCHC: 33.6 g/dL (ref 32.0–36.0)
MCV: 117.3 fL — ABNORMAL HIGH (ref 80.0–100.0)
Platelets: 119 10*3/uL — ABNORMAL LOW (ref 150–440)
RBC: 2.39 MIL/uL — AB (ref 4.40–5.90)
RDW: 16.3 % — ABNORMAL HIGH (ref 11.5–14.5)
WBC: 1.7 10*3/uL — ABNORMAL LOW (ref 3.8–10.6)

## 2015-01-29 LAB — BASIC METABOLIC PANEL
ANION GAP: 5 (ref 5–15)
BUN: 15 mg/dL (ref 6–20)
CALCIUM: 8.7 mg/dL — AB (ref 8.9–10.3)
CO2: 26 mmol/L (ref 22–32)
Chloride: 108 mmol/L (ref 101–111)
Creatinine, Ser: 1.29 mg/dL — ABNORMAL HIGH (ref 0.61–1.24)
GFR calc Af Amer: 59 mL/min — ABNORMAL LOW (ref >60)
GFR, EST NON AFRICAN AMERICAN: 51 mL/min — AB (ref >60)
GLUCOSE: 152 mg/dL — AB (ref 65–99)
POTASSIUM: 3.8 mmol/L (ref 3.5–5.1)
Sodium: 139 mmol/L (ref 135–145)

## 2015-01-29 LAB — PHOSPHORUS: Phosphorus: 2.6 mg/dL (ref 2.5–4.6)

## 2015-01-29 MED ORDER — TIOTROPIUM BROMIDE MONOHYDRATE 18 MCG IN CAPS
18.0000 ug | ORAL_CAPSULE | Freq: Every day | RESPIRATORY_TRACT | Status: DC
Start: 2015-01-29 — End: 2015-01-30
  Administered 2015-01-29 – 2015-01-30 (×2): 18 ug via RESPIRATORY_TRACT
  Filled 2015-01-29: qty 5

## 2015-01-29 MED ORDER — ASPIRIN EC 81 MG PO TBEC
162.0000 mg | DELAYED_RELEASE_TABLET | Freq: Every day | ORAL | Status: DC
  Administered 2015-01-29 – 2015-01-30 (×2): 162 mg via ORAL
  Filled 2015-01-29 (×2): qty 2

## 2015-01-29 MED ORDER — MOMETASONE FURO-FORMOTEROL FUM 200-5 MCG/ACT IN AERO
2.0000 | INHALATION_SPRAY | Freq: Two times a day (BID) | RESPIRATORY_TRACT | Status: DC
  Administered 2015-01-29 – 2015-01-30 (×3): 2 via RESPIRATORY_TRACT
  Filled 2015-01-29: qty 8.8

## 2015-01-29 NOTE — Progress Notes (Signed)
Pt. admitted to unit, report taken from ED RN,April. Oriented to room, call bell, Ascom phones and staff. Bed in low position, yellow non-skid socks in place, bed alarm on.  Skin assessed with Quita Skye, RN. Fall safety plan reviewed, fall contract signed by pt and RN and placed on wall. Full assessment to Epic. Will continue to monitor.

## 2015-01-29 NOTE — Consult Note (Signed)
Date: 01/29/2015,   MRN# [id number] Frank Mcdaniel 1933-06-22 Code Status:     Code Status Orders        Start     Ordered   01/28/15 2356  Full code   Continuous     01/28/15 2355    Advance Directive Documentation        Most Recent Value   Type of Advance Directive  Living will   Pre-existing out of facility DNR order (yellow form or pink MOST form)     "MOST" Form in Place?             AdmissionWeight: 160 lb (72.576 kg)                 CurrentWeight: 156 lb 3.2 oz (70.852 kg)      CHIEF COMPLAINT:   Generalized weakness    HISTORY OF PRESENT ILLNESS   Frank Mcdaniel is a 81 y.o. White male who presents with acute hypoxia with SOB.  Patient has been recently admitted multiple times for this, and was sent home after his last admission after significant cardiac workup which was negative. He was sent home with oxygen at that time, and given follow-up with cardiology and pulmonology Dr. Raul Del.   He returns today after significant acute shortness of breath again and had O2 sats in the low 80s on arrival to the ED. This improved somewhat with supplemental oxygen. Negative cardiac enzymes, chest x-ray does not show any focal infiltrate fluid or effusion. During his last hospital stay he had CT scan which is negative as well, it was a CTA chest ruling out PE, but also did not show any other significant pathology except for some pulmonary nodules.   Patient has had recurrent history of hairy cell leukemia, he states he has not been on any chemotherapy for this for the last couple years. His white blood cell count is low today, and on chart review appears it has been low for the last several months, he also has chronic anemia, though not extreme, with a hemoglobin that ranges between 9 and 11, and is 9.4 today. Patient denies infectious symptoms,   PAST MEDICAL HISTORY   Past Medical History  Diagnosis Date  . Coronary artery disease     a. 2000 s/p CABG;  b.  10/2008 Neg MV, EF 64%.  [name] BPH (benign prostatic hyperplasia)   . Hyperlipidemia   . Leukemia   . H/O echocardiogram     a. 10/2005 Echo: nl EF.  [name] Atypical chest pain     a. 08/2014  . Asymptomatic Sinus Bradycardia   . Asthma   . Hypertension   . GERD (gastroesophageal reflux disease)   . HTN (hypertension)   . CKD (chronic kidney disease), stage III      SURGICAL HISTORY   Past Surgical History  Procedure Laterality Date  . Coronary artery bypass graft  2000  . Cataract extraction       FAMILY HISTORY   Family History  Problem Relation Age of Onset  . Other      no premature CAD.     SOCIAL HISTORY   History  Substance Use Topics  . Smoking status: Never Smoker   . Smokeless tobacco: Never Used  . Alcohol Use: No     MEDICATIONS    Home Medication:  No current outpatient prescriptions on file.  Current Medication:  Current facility-administered medications:  .  acetaminophen (TYLENOL) tablet 650 mg, 650 mg, Oral, Q6H PRN **  OR** acetaminophen (TYLENOL) suppository 650 mg, 650 mg, Rectal, Q6H PRN, Lance Coon, MD .  acyclovir (ZOVIRAX) 200 MG capsule 400 mg, 400 mg, Oral, BID, Lance Coon, MD, 400 mg at 01/29/15 9381 .  aspirin EC tablet 162 mg, 162 mg, Oral, Daily, Lance Coon, MD, 162 mg at 01/29/15 0910 .  atorvastatin (LIPITOR) tablet 20 mg, 20 mg, Oral, Daily, Lance Coon, MD, 20 mg at 01/29/15 0909 .  enoxaparin (LOVENOX) injection 40 mg, 40 mg, Subcutaneous, Q24H, Lance Coon, MD, 40 mg at 01/29/15 0047 .  ipratropium-albuterol (DUONEB) 0.5-2.5 (3) MG/3ML nebulizer solution 3 mL, 3 mL, Nebulization, Q4H PRN, Lance Coon, MD .  ondansetron [facility]) tablet 4 mg, 4 mg, Oral, Q6H PRN **OR** ondansetron (ZOFRAN) injection 4 mg, 4 mg, Intravenous, Q6H PRN, Lance Coon, MD .  pantoprazole (PROTONIX) EC tablet 40 mg, 40 mg, Oral, Daily, Lance Coon, MD, 40 mg at 01/29/15 0910 .  senna-docusate (Senokot-S) tablet 1 tablet, 1 tablet, Oral, QHS PRN,  Lance Coon, MD .  sodium chloride 0.9 % injection 3 mL, 3 mL, Intravenous, Q12H, Lance Coon, MD, 3 mL at 01/29/15 0911 .  tamsulosin (FLOMAX) capsule 0.4 mg, 0.4 mg, Oral, Daily, Lance Coon, MD, 0.4 mg at 01/29/15 8299    ALLERGIES   Review of patient's allergies indicates no known allergies.     REVIEW OF SYSTEMS   Review of Systems  Constitutional: Positive for malaise/fatigue. Negative for fever, chills, weight loss and diaphoresis.  HENT: Negative for ear discharge, ear pain, hearing loss, nosebleeds and tinnitus.   Eyes: Negative for blurred vision, double vision and photophobia.  Respiratory: Positive for shortness of breath. Negative for cough, hemoptysis, sputum production and wheezing.   Cardiovascular: Negative for chest pain, palpitations and orthopnea.  Gastrointestinal: Positive for heartburn and nausea. Negative for vomiting, abdominal pain, diarrhea and constipation.  Genitourinary: Positive for urgency and frequency. Negative for dysuria.  Musculoskeletal: Negative for myalgias, back pain and neck pain.  Skin: Negative for itching and rash.  Neurological: Positive for weakness. Negative for dizziness, tingling, tremors and headaches.  Endo/Heme/Allergies: Does not bruise/bleed easily.  Psychiatric/Behavioral: Negative for depression, suicidal ideas and substance abuse.     VS: BP 140/57 mmHg  Pulse 71  Temp(Src) 97.7 F (36.5 C) (Oral)  Resp 18  Ht 5\' 8"  (1.727 m)  Wt 156 lb 3.2 oz (70.852 kg)  BMI 23.76 kg/m2  SpO2 92%     PHYSICAL EXAM  Physical Exam  Constitutional: He is oriented to person, place, and time. He appears well-developed and well-nourished. No distress.  pale  HENT:  Head: Normocephalic and atraumatic.  Mouth/Throat: No oropharyngeal exudate.  Eyes: EOM are normal. Pupils are equal, round, and reactive to light. No scleral icterus.  Neck: Normal range of motion. Neck supple.  Cardiovascular: Normal rate, regular rhythm and  normal heart sounds.   No murmur heard. Pulmonary/Chest: No stridor. No respiratory distress. He has no wheezes. He has no rales.  Abdominal: Soft. Bowel sounds are normal. He exhibits no distension. There is no tenderness. There is no rebound.  Musculoskeletal: Normal range of motion. He exhibits no edema.  Neurological: He is alert and oriented to person, place, and time. He displays normal reflexes. Coordination normal.  Skin: Skin is warm. He is not diaphoretic.  Psychiatric: He has a normal mood and affect.        LABS    Recent Labs     01/28/15  1923  01/29/15  0505  HGB  9.4*  9.4*  HCT  28.8*  28.0*  MCV  117.2*  117.3*  WBC  2.2*  1.7*  BUN  15  15  CREATININE  1.44*  1.29*  GLUCOSE  119*  152*  CALCIUM  8.9  8.7*  ,    No results for input(s): PH in the last 72 hours.  Invalid input(s): PCO2, PO2, BASEEXCESS, BASEDEFICITE, TFT    CULTURE RESULTS   No results found for this or any previous visit (from the past 240 hour(s)).        IMAGING    Dg Chest Port 1 View  01/28/2015   CLINICAL DATA:  Left chest pain.  EXAM: PORTABLE CHEST - 1 VIEW  COMPARISON:  01/13/2015  FINDINGS: Prior CABG. The lungs appear clear. Heart size within normal limits. No pleural effusion.  IMPRESSION: 1. No significant abnormality to explain the patient's chest pain.   Electronically Signed   By: Van Clines M.D.   On: 01/28/2015 19:52           ASSESSMENT/PLAN   81 yo white male with recurrent bouts of weakness and  SOB with hypoxia, etiology is unclear, I am concerned for recurrent leukemia Oxygenation  has  improved without any significant intervention, patient may have underlying reactive airways disease but clinical signs and symptoms are not consistant  1.oxygen as needed 2.will start dulera/spiriva 3.recommend Oncology Consult-assess for recurrent disease 4.recommend GI consult and CT abd/pelvis  5.would not recommend systemic steroids at this  time   I have personally obtained a history, examined the patient, evaluated laboratory and imaging results, formulated the assessment and plan and placed orders.  The Patient requires high complexity decision making for assessment and support, frequent evaluation and titration of therapies, application of advanced monitoring technologies and extensive interpretation of multiple databases. Time spent with patient 65 minutes.    Corrin Parker, M.D. [facility] [place] Director Intensive Care Unit

## 2015-01-29 NOTE — Progress Notes (Signed)
[facility]  Date of admission:  01/28/2015  Inpatient day:  01/29/2015  Consulting physician: Lance Coon, MD   Chief Complaint: Frank Mcdaniel is a 81 y.o. male with cell leukemia who is admitted through the emergency room with respiratory failure with hypoxemia.  HPI: The patient was diagnosed with hairy cell leukemia in 2006. He presented with pancytopenia. He received his first course of cladribine beginning 06/03/2005.  He was retreated with cladribine beginning on 08/25/2009. He experienced his second relapse and was treated for a third time with cladribine beginning 06/07/2013. Counts at that time included a hematocrit 23.2, hemoglobin 7.8, MCV 119, platelets 69,000, white count 1500 with an ANC of 800. The patient achieved a maximum response to therapy on 06/03/2014. At that time hematocrit was 35.2, hemoglobin 11.2, platelets 170,000, white count 6500 with an ANC of 5000.  He has had declining count since 09/01/2014. He was seen by Dr. Inez Pilgrim on 11/04/2014.  Counts included a hematocrit of 28.5, hemoglobin 9.5, platelets 130,000, white count 2400 and an ANC of 1600.  Regarding his anemia, he had a negative colonoscopy in 2008. He was to have a follow-up colonoscopy about 2 years ago, but was canceled secondary to his low hematocrit. EGD in 08/2014 was negative. Guaiac cards 2-3 weeks ago were negative.  He denies any melena or hematochezia. He notes that his diet is good and eats meat 7 days a week.  B12 was 294 (180-914) on 01/13/2015.  He was seen by Dr. [name] on 12/29/2014.  At that time, he felt more weak and fatigued. He denied any fevers or infections. Labs included a hematocrit 29.7, hemoglobin 10.1, and WBC 2500 (no differential).  The patient describes ongoing progressive weakness since 12/26/2014. At that time he describes spraying his front porch and the start of spells of shortness of breath and fatigue. 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 He has had several spells in the past month. He describes feeling "awful".  On 01/13/2015,  he underwent cardiac workup including stress test and echocardiogram with bubble study.  Chest CT angiogram at that time was negative for pulmonary embolism.  He was admitted because of these ongoing symptoms.  Oxygen sats in the emergency room were in the low 80s. He had negative cardiac enzymes.  Chest x-ray was without infiltrates. CBC included a hematocrit of 28.8, hemoglobin 9.4, platelets 119,000 and white count 2200.  Past Medical History  Diagnosis Date  . Coronary artery disease     a. 2000 s/p CABG;  b. 10/2008 Neg MV, EF 64%.  [name] BPH (benign prostatic hyperplasia)   . Hyperlipidemia   . Leukemia   . H/O echocardiogram     a. 10/2005 Echo: nl EF.  [name] Atypical chest pain     a. 08/2014  . Asymptomatic Sinus Bradycardia   . Asthma   . Hypertension   . GERD (gastroesophageal reflux disease)   . HTN (hypertension)   . CKD (chronic kidney disease), stage III     Past Surgical History  Procedure Laterality Date  . Coronary artery bypass graft  2000  . Cataract extraction      Family History  Problem Relation Age of Onset  . Other      no premature CAD.    Social History:  reports that he has never smoked. He has never used smokeless tobacco. He reports that he does not drink alcohol or use illicit  drugs. He is accompanied by his son, Frank Mcdaniel.  Allergies: No Known Allergies  Medications Prior to Admission  Medication Sig Dispense Refill  . acyclovir (ZOVIRAX) 400 MG tablet Take 1 tablet (400 mg total) by mouth 2 (two) times daily. 60 tablet 2  . aspirin 81 MG EC tablet Take 2 tablets (162 mg total) by mouth daily. 30 tablet 0  . atorvastatin (LIPITOR) 40 MG tablet Take 20 mg by mouth daily.     . dapsone 100 MG tablet TAKE 1 TABLET BY MOUTH EVERY DAY 30 tablet 3  . Multiple Vitamin (MULTIVITAMIN) tablet Take 1 tablet  by mouth daily.      . OXYGEN Inhale 2 L/min into the lungs as needed (for breathing). Oxygen @@ 2 liters via nasal cannula as needed through the day.    . pantoprazole (PROTONIX) 40 MG tablet Take 40 mg by mouth daily.    . ranitidine (ZANTAC) 150 MG capsule Take 150 mg by mouth 2 (two) times daily as needed for heartburn.     . tamsulosin (FLOMAX) 0.4 MG CAPS capsule Take 0.4 mg by mouth daily.      Review of Systems: GENERAL:  Fatigue.  Weakness.  No fevers, sweats or weight loss. PERFORMANCE STATUS (ECOG):  1 HEENT:  No visual changes, runny nose, sore throat, mouth sores or tenderness. Lungs: Shortness of breath.  No cough.  No hemoptysis. Cardiac:  Intermittent "presence in chest".  Rarely an ache in the left chest.  No palpitations, orthopnea, or PND. GI:  No nausea, vomiting, diarrhea, constipation, melena or hematochezia. GU:  No urgency, frequency, dysuria, or hematuria. Musculoskeletal:  Joint pain.  No back pain. No muscle tenderness. Extremities:  No pain or swelling. Skin:  No rashes or skin changes. Neuro:  No headache, numbness or weakness, balance or coordination issues. Endocrine:  No diabetes, thyroid issues, hot flashes or night sweats. Psych:  No mood changes, depression or anxiety. Pain:  No focal pain. Review of systems:  All other systems reviewed and found to be negative.  Physical Exam:  Blood pressure 140/57, pulse 71, temperature 97.7 F (36.5 C), temperature source Oral, resp. rate 18, height 5' 8"  (1.727 m), weight 156 lb 3.2 oz (70.852 kg), SpO2 92 %.  GENERAL:  Well developed, well nourished, sitting comfortably on the medical unit in no acute distress. MENTAL STATUS:  Alert and oriented to person, place and time. HEAD:  [name] hair.  Normocephalic, atraumatic, face symmetric, no Cushingoid features. EYES:  Glasses. Brown/hazel eyes with bilateral arcus senilis.  Pupils equal round and reactive to light and accomodation.  No conjunctivitis or scleral  icterus. ENT:  Ald[place] in place.  Oropharynx clear without lesion.  Tongue normal. Mucous membranes moist.  RESPIRATORY:  Clear to auscultation without rales, wheezes or rhonchi. CARDIOVASCULAR:  Regular rate and rhythm without murmur, rub or gallop. ABDOMEN:  Soft, non-tender, with active bowel sounds, and no hepatomegaly.  Spleen tip just palpable.  No masses. SKIN:  No rashes, ulcers or lesions. EXTREMITIES: No edema, no skin discoloration or tenderness.  No palpable cords. LYMPH NODES: No palpable cervical, supraclavicular, axillary or inguinal adenopathy  NEUROLOGICAL: Unremarkable. PSYCH:  Appropriate.   Results for orders placed or performed during the hospital encounter of 01/28/15 (from the past 48 hour(s))  CBC     Status: Abnormal   Collection Time: 01/28/15  7:23 PM  Result Value Ref Range   WBC 2.2 (L) 3.8 - 10.6 K/uL   RBC 2.45 (L) 4.40 - 5.90  MIL/uL   Hemoglobin 9.4 (L) 13.0 - 18.0 g/dL   HCT 28.8 (L) 40.0 - 52.0 %   MCV 117.2 (H) 80.0 - 100.0 fL   MCH 38.5 (H) 26.0 - 34.0 pg   MCHC 32.9 32.0 - 36.0 g/dL   RDW 16.3 (H) 11.5 - 14.5 %   Platelets 119 (L) 150 - 440 K/uL  Basic metabolic panel     Status: Abnormal   Collection Time: 01/28/15  7:23 PM  Result Value Ref Range   Sodium 140 135 - 145 mmol/L   Potassium 3.2 (L) 3.5 - 5.1 mmol/L   Chloride 106 101 - 111 mmol/L   CO2 25 22 - 32 mmol/L   Glucose, Bld 119 (H) 65 - 99 mg/dL   BUN 15 6 - 20 mg/dL   Creatinine, Ser 1.44 (H) 0.61 - 1.24 mg/dL   Calcium 8.9 8.9 - 10.3 mg/dL   GFR calc non Af Amer 44 (L) >60 mL/min   GFR calc Af Amer 51 (L) >60 mL/min    Comment: (NOTE) The eGFR has been calculated using the CKD EPI equation. This calculation has not been validated in all clinical situations. eGFR's persistently <60 mL/min signify possible Chronic Kidney Disease.    Anion gap 9 5 - 15  Troponin I     Status: None   Collection Time: 01/28/15  7:23 PM  Result Value Ref Range   Troponin I <0.03 <0.031 ng/mL     Comment:        NO INDICATION OF MYOCARDIAL INJURY.   Magnesium     Status: None   Collection Time: 01/29/15  5:05 AM  Result Value Ref Range   Magnesium 2.2 1.7 - 2.4 mg/dL  Phosphorus     Status: None   Collection Time: 01/29/15  5:05 AM  Result Value Ref Range   Phosphorus 2.6 2.5 - 4.6 mg/dL  Basic metabolic panel     Status: Abnormal   Collection Time: 01/29/15  5:05 AM  Result Value Ref Range   Sodium 139 135 - 145 mmol/L   Potassium 3.8 3.5 - 5.1 mmol/L   Chloride 108 101 - 111 mmol/L   CO2 26 22 - 32 mmol/L   Glucose, Bld 152 (H) 65 - 99 mg/dL   BUN 15 6 - 20 mg/dL   Creatinine, Ser 1.29 (H) 0.61 - 1.24 mg/dL   Calcium 8.7 (L) 8.9 - 10.3 mg/dL   GFR calc non Af Amer 51 (L) >60 mL/min   GFR calc Af Amer 59 (L) >60 mL/min    Comment: (NOTE) The eGFR has been calculated using the CKD EPI equation. This calculation has not been validated in all clinical situations. eGFR's persistently <60 mL/min signify possible Chronic Kidney Disease.    Anion gap 5 5 - 15  CBC     Status: Abnormal   Collection Time: 01/29/15  5:05 AM  Result Value Ref Range   WBC 1.7 (L) 3.8 - 10.6 K/uL   RBC 2.39 (L) 4.40 - 5.90 MIL/uL   Hemoglobin 9.4 (L) 13.0 - 18.0 g/dL   HCT 28.0 (L) 40.0 - 52.0 %   MCV 117.3 (H) 80.0 - 100.0 fL   MCH 39.4 (H) 26.0 - 34.0 pg   MCHC 33.6 32.0 - 36.0 g/dL   RDW 16.3 (H) 11.5 - 14.5 %   Platelets 119 (L) 150 - 440 K/uL   Dg Chest Port 1 View  01/28/2015   CLINICAL DATA:  Left chest pain.  EXAM:  PORTABLE CHEST - 1 VIEW  COMPARISON:  01/13/2015  FINDINGS: Prior CABG. The lungs appear clear. Heart size within normal limits. No pleural effusion.  IMPRESSION: 1. No significant abnormality to explain the patient's chest pain.   Electronically Signed   By: Van Clines M.D.   On: 01/28/2015 19:52    Assessment:  The patient is a 81 y.o. gentleman with hairy cell leukemia initially diagnosed in 2006. He has been treated on 3 separate occasions with cladribine  (last 06/07/2013). Counts have been declining since 08/2014. He notes symptoms of increasing fatigue and shortness of breath.  He has had no infections.  White count is low. He is likely neutropenic.  Differential is unavailable. He has a macrocytic anemia which may be related to his dapsone, vitamin deficiency or possibly an underlying myelodysplastic syndrome. B12 was low normal on 01/13/2015.    He has no history of GI bleeding. Colonoscopy 8 years ago was negative, but was unable to be repeated 2 years ago secondary to anemia. Recent guaiac cards were negative.   Plan:   1)  I discussed the diagnosis of hairy cell leukemia and treatment to date with the patient and his son.  The time between treatments is decreasing. Given his anemia and symptoms, he may require retreatment. Indications for treatment include symptoms, recurrent infections, hemoglobin less than 12, platelets less than 100,000, and ANC less than 1000.  He may require a follow-up bone marrow in the outpatient department.  I discussed potential future treatment options if felt necessary by Dr. [name]. We discussed cladribine or pentostatin with Rituxan. Side effects were reviewed. I discussed some baseline labs in the event that he is treated with Rituxan in the future. 2)  Labs:  CBC with diff, hepatitis B core antibody, hepatitis B surface antigen, hepatitis C antibody, MMA, folate, TSH. 3)  Neutropenic precautions.  Thank you for allowing me to participate in [place] 's care.  I will follow him closely with you while hospitalized.   Lequita Asal, MD  01/29/2015, 11:51 AM

## 2015-01-29 NOTE — Progress Notes (Signed)
[place] [facility] [place] NAME: Frank Mcdaniel    MR#:  [id number]  DATE OF BIRTH:  20-Dec-1932  SUBJECTIVE:  Came in with sob,hypoxia and nausea after being out in the heat from 9am to 5 pm. No cp  REVIEW OF SYSTEMS:   Review of Systems  Constitutional: Negative for fever, chills and weight loss.  HENT: Negative for ear discharge, ear pain and nosebleeds.   Eyes: Negative for blurred vision, pain and discharge.  Respiratory: Positive for shortness of breath. Negative for cough, sputum production, wheezing and stridor.   Cardiovascular: Negative for chest pain, palpitations, orthopnea and PND.  Gastrointestinal: Negative for nausea, vomiting, abdominal pain and diarrhea.  Genitourinary: Negative for urgency and frequency.  Musculoskeletal: Negative for back pain and joint pain.  Neurological: Negative for sensory change, speech change, focal weakness and weakness.  Psychiatric/Behavioral: Negative for depression. The patient is not nervous/anxious.   All other systems reviewed and are negative.  Tolerating Diet:yes  DRUG ALLERGIES:  No Known Allergies  VITALS:  Blood pressure 140/57, pulse 71, temperature 97.7 F (36.5 C), temperature source Oral, resp. rate 18, height 5\' 8"  (1.727 m), weight 70.852 kg (156 lb 3.2 oz), SpO2 92 %.  PHYSICAL EXAMINATION:   Physical Exam  GENERAL:  81 y.o.-year-old patient lying in the bed with no acute distress.  EYES: Pupils equal, round, reactive to light and accommodation. No scleral icterus. Extraocular muscles intact.  HEENT: Head atraumatic, normocephalic. Oropharynx and nasopharynx clear.  NECK:  Supple, no jugular venous distention. No thyroid enlargement, no tenderness.  LUNGS: Normal breath sounds bilaterally, no wheezing, rales, rhonchi. No use of accessory muscles of respiration.  CARDIOVASCULAR: S1, S2 normal. No murmurs, rubs, or gallops.  ABDOMEN: Soft, nontender, nondistended. Bowel  sounds present. No organomegaly or mass.  EXTREMITIES: No cyanosis, clubbing or edema b/l.    NEUROLOGIC: Cranial nerves II through XII are intact. No focal Motor or sensory deficits b/l.   PSYCHIATRIC: The patient is alert and oriented x 3.  SKIN: No obvious rash, lesion, or ulcer.    LABORATORY PANEL:   CBC  Recent Labs Lab 01/29/15 0505  WBC 1.7*  HGB 9.4*  HCT 28.0*  PLT 119*    Chemistries   Recent Labs Lab 01/29/15 0505  NA 139  K 3.8  CL 108  CO2 26  GLUCOSE 152*  BUN 15  CREATININE 1.29*  CALCIUM 8.7*  MG 2.2    Cardiac Enzymes  Recent Labs Lab 01/28/15 1923  TROPONINI <0.03    RADIOLOGY:  Dg Chest Port 1 View  01/28/2015   CLINICAL DATA:  Left chest pain.  EXAM: PORTABLE CHEST - 1 VIEW  COMPARISON:  01/13/2015  FINDINGS: Prior CABG. The lungs appear clear. Heart size within normal limits. No pleural effusion.  IMPRESSION: 1. No significant abnormality to explain the patient's chest pain.   Electronically Signed   By: Van Clines M.D.   On: 01/28/2015 19:52   ASSESSMENT AND PLAN:   *Acute respiratory failure with hypoxemia - unclear etiology. Patient states he did have asthma  -sats 88% on 2liter [place] -CT chest for PE on 6/10 negative -he had stress test -neg -echo normal -likely appears asthma getting triggerd by statying outdoor -Dr Zoila Shutter input noted. Started on inhlaers -cont oxygen CXR neg  * Chest pain - resolved when his respiratory distress is alleviated. Continue treatment as above, recent cardiac workup which was extensive was negative, and his troponin is negative  here today, no new EKG changes.  * Hairy cell leukemia - with his leukopenia, and these respiratory problems, we will get an oncology consult just to see if they have any thoughts as to whether or not his hairy cell leukemia may either be reemerging, and/or may have anything to do this respiratory distress.  *HTN (hypertension) - chronic stable problem, continue home  meds  * Coronary artery disease - chronic problem, extensive negative cardiac workup recently, continue home meds  * Hyperlipidemia - chronic stable, continue home meds  * BPH (benign prostatic hyperplasia) - continue home Flomax  Case discussed with pt and son Management plans discussed with the patient, family and they are in agreement.  CODE STATUS: FUll  DVT Prophylaxis: heparin  TOTAL TIME TAKING CARE OF THIS PATIENT: 35 minutes.  >50% time spent on counselling and coordination of care  POSSIBLE D/C IN 1 DAYS, DEPENDING ON CLINICAL CONDITION.   Titilayo Hagans M.D on 01/29/2015 at 12:02 PM  Between 7am to 6pm - Pager - [phone]  After 6pm go to [email or web address] - password [email or web address] [facility]  Office  [phone]  CC: Primary care physician; Morton Peters, MD

## 2015-01-29 NOTE — Progress Notes (Signed)
Alert and oriented. Sinus rhythm with bundle branch block on tele. On 2.5L of oxygen currently, sat dropped to 87% on 2L today. Patient sats in the upper 80's to low 90's. Prepared patient for wearing oxygen at home at all times, will take some reinforcing and repeat education. Oncology consulted today and ordered labs to be drawn tomorrow and patient needs outpatient follow-up for possible reoccurrence of leukemia. Patient and son are aware.

## 2015-01-30 LAB — CBC WITH DIFFERENTIAL/PLATELET
Basophils Absolute: 0 10*3/uL (ref 0–0.1)
Basophils Relative: 0 %
Eosinophils Absolute: 0 10*3/uL (ref 0–0.7)
Eosinophils Relative: 1 %
HCT: 27.6 % — ABNORMAL LOW (ref 40.0–52.0)
Hemoglobin: 9.1 g/dL — ABNORMAL LOW (ref 13.0–18.0)
Lymphocytes Relative: 12 %
Lymphs Abs: 0.3 10*3/uL — ABNORMAL LOW (ref 1.0–3.6)
MCH: 38.8 pg — ABNORMAL HIGH (ref 26.0–34.0)
MCHC: 32.8 g/dL (ref 32.0–36.0)
MCV: 118.4 fL — ABNORMAL HIGH (ref 80.0–100.0)
Monocytes Absolute: 0.1 10*3/uL — ABNORMAL LOW (ref 0.2–1.0)
Monocytes Relative: 3 %
Neutro Abs: 2.1 10*3/uL (ref 1.4–6.5)
Neutrophils Relative %: 84 %
Platelets: 114 10*3/uL — ABNORMAL LOW (ref 150–440)
RBC: 2.33 MIL/uL — ABNORMAL LOW (ref 4.40–5.90)
RDW: 17 % — ABNORMAL HIGH (ref 11.5–14.5)
WBC: 2.5 10*3/uL — ABNORMAL LOW (ref 3.8–10.6)

## 2015-01-30 LAB — LACTATE DEHYDROGENASE: LDH: 184 U/L (ref 98–192)

## 2015-01-30 LAB — GLUCOSE, CAPILLARY: Glucose-Capillary: 103 mg/dL — ABNORMAL HIGH (ref 65–99)

## 2015-01-30 LAB — TSH: TSH: 1.524 u[IU]/mL (ref 0.350–4.500)

## 2015-01-30 LAB — FOLATE: Folate: 31 ng/mL (ref >5.9)

## 2015-01-30 MED ORDER — MOMETASONE FURO-FORMOTEROL FUM 200-5 MCG/ACT IN AERO: 2.0000 | INHALATION_SPRAY | Freq: Two times a day (BID) | RESPIRATORY_TRACT | Status: DC

## 2015-01-30 MED ORDER — TIOTROPIUM BROMIDE MONOHYDRATE 18 MCG IN CAPS: 18.0000 ug | ORAL_CAPSULE | Freq: Every day | RESPIRATORY_TRACT | Status: DC

## 2015-01-30 NOTE — Care Management (Signed)
Attending has documented that probable source of hypoxia is underlying asthma and presentation this admission is acute on chronic hypoxia due to asthma  Has qualified for continuous 02.  [facility] has delivered the portable tank to room.

## 2015-01-30 NOTE — Progress Notes (Signed)
Discharge instructions along with home medication list and follow up gone over with patient. rx electronically submitted per md. Patient verbalized that he understood instructions. Iv and telemetry removed. Personal 02 tank at bedside. No distress noted. Patient to be discharge home with son [employer]

## 2015-01-30 NOTE — Progress Notes (Signed)
   01/30/15 0920 01/30/15 0923  Oxygen Therapy  SpO2 (!) 87 % 94 %  O2 Device Room Air (at rest) Nasal Cannula (at rest)  O2 Flow Rate (L/min) --  2 L/min

## 2015-01-30 NOTE — Discharge Summary (Signed)
Frank Mcdaniel at [facility] NAME: Frank Mcdaniel    MR#:  [id number]  DATE OF BIRTH:  11-08-1932  DATE OF ADMISSION:  01/28/2015 ADMITTING PHYSICIAN: Lance Coon, MD  DATE OF DISCHARGE: 01/30/2015  PRIMARY CARE PHYSICIAN: Morton Peters, MD    ADMISSION DIAGNOSIS:  Chest pain;Sick  DISCHARGE DIAGNOSIS:  Acute hypoxic respiratory failure suspected from acute on chronic Asthma flare Chronic pancytopenia due to Hairy cell Leukemia  SECONDARY DIAGNOSIS:   Past Medical History  Diagnosis Date  . Coronary artery disease     a. 2000 s/p CABG;  b. 10/2008 Neg MV, EF 64%.  [name] BPH (benign prostatic hyperplasia)   . Hyperlipidemia   . Leukemia   . H/O echocardiogram     a. 10/2005 Echo: nl EF.  [name] Atypical chest pain     a. 08/2014  . Asymptomatic Sinus Bradycardia   . Asthma   . Hypertension   . GERD (gastroesophageal reflux disease)   . HTN (hypertension)   . CKD (chronic kidney disease), stage III     HOSPITAL COURSE:   *Acute respiratory failure with hypoxemia suspect acute on chronic Asthma flare and chronic anemia from-sats 88% on 2liter [place] -CT chest for PE on 6/10 negative -he had stress test -neg -echo normal -likely appears asthma getting triggerd by staying outdoor  -Dr Zoila Shutter input noted. Started on inhlaers -cont oxygen continuous given hypoxia CXR neg  *Chest pain - resolved when his respiratory distress is alleviated. Continue treatment as above, recent cardiac workup which was extensive was negative, and his troponin is negative here today, no new EKG changes.  * Hairy cell leukemia - with his leukopenia, and these respiratory problems Seen by Dr Mike Gip and recommends f/u with Dr [name] for possible starting rx for his chornic condition Pt has outpt appt next week  *HTN (hypertension) - chronic stable problem, continue home meds  * Coronary artery disease - chronic problem, extensive negative  cardiac workup recently, continue home meds  * Hyperlipidemia - chronic stable, continue home meds  * BPH (benign prostatic hyperplasia) - continue home Flomax  D/c home once oxygen is set up   DISCHARGE CONDITIONS:   fair  CONSULTS OBTAINED:    Flora Lipps, MD Lequita Asal, MD  DRUG ALLERGIES:  No Known Allergies  DISCHARGE MEDICATIONS:   Current Discharge Medication List    START taking these medications   Details  mometasone-formoterol (DULERA) 200-5 MCG/ACT AERO Inhale 2 puffs into the lungs 2 (two) times daily. Qty: 1 Inhaler, Refills: 0    tiotropium (SPIRIVA) 18 MCG inhalation capsule Place 1 capsule (18 mcg total) into inhaler and inhale daily. Qty: 30 capsule, Refills: 12      CONTINUE these medications which have NOT CHANGED   Details  acyclovir (ZOVIRAX) 400 MG tablet Take 1 tablet (400 mg total) by mouth 2 (two) times daily. Qty: 60 tablet, Refills: 2    aspirin 81 MG EC tablet Take 2 tablets (162 mg total) by mouth daily. Qty: 30 tablet, Refills: 0    atorvastatin (LIPITOR) 40 MG tablet Take 20 mg by mouth daily.     dapsone 100 MG tablet TAKE 1 TABLET BY MOUTH EVERY DAY Qty: 30 tablet, Refills: 3    Multiple Vitamin (MULTIVITAMIN) tablet Take 1 tablet by mouth daily.      OXYGEN Inhale 2 L/min into the lungs as needed (for breathing). Oxygen @@ 2 liters via nasal cannula as needed through  the day.    pantoprazole (PROTONIX) 40 MG tablet Take 40 mg by mouth daily.    ranitidine (ZANTAC) 150 MG capsule Take 150 mg by mouth 2 (two) times daily as needed for heartburn.     tamsulosin (FLOMAX) 0.4 MG CAPS capsule Take 0.4 mg by mouth daily.       If you experience worsening of your admission symptoms, develop shortness of breath, life threatening emergency, suicidal or homicidal thoughts you must seek medical attention immediately by calling 911 or calling your MD immediately  if symptoms less severe.  You Must read complete  instructions/literature along with all the possible adverse reactions/side effects for all the Medicines you take and that have been prescribed to you. Take any new Medicines after you have completely understood and accept all the possible adverse reactions/side effects.   Please note  You were cared for by a hospitalist during your hospital stay. If you have any questions about your discharge medications or the care you received while you were in the hospital after you are discharged, you can call the unit and asked to speak with the hospitalist on call if the hospitalist that took care of you is not available. Once you are discharged, your primary care physician will handle any further medical issues. Please note that NO REFILLS for any discharge medications will be authorized once you are discharged, as it is imperative that you return to your primary care physician (or establish a relationship with a primary care physician if you do not have one) for your aftercare needs so that they can reassess your need for medications and monitor your lab values. Today   SUBJECTIVE  Feels better today. No cp  VITAL SIGNS:  Blood pressure 122/56, pulse 58, temperature 98.6 F (37 C), temperature source Oral, resp. rate 18, height 5\' 8"  (1.727 m), weight 74.778 kg (164 lb 13.7 oz), SpO2 93 %.  I/O:   Intake/Output Summary (Last 24 hours) at 01/30/15 0916 Last data filed at 01/29/15 1419  Gross per 24 hour  Intake    480 ml  Output      0 ml  Net    480 ml    PHYSICAL EXAMINATION:  GENERAL:  81 y.o.-year-old patient lying in the bed with no acute distress.  EYES: Pupils equal, round, reactive to light and accommodation. No scleral icterus. Extraocular muscles intact.  HEENT: Head atraumatic, normocephalic. Oropharynx and nasopharynx clear.  NECK:  Supple, no jugular venous distention. No thyroid enlargement, no tenderness.  LUNGS: Normal breath sounds bilaterally, no wheezing, rales,rhonchi or  crepitation. No use of accessory muscles of respiration.  CARDIOVASCULAR: S1, S2 normal. No murmurs, rubs, or gallops.  ABDOMEN: Soft, non-tender, non-distended. Bowel sounds present. No organomegaly or mass.  EXTREMITIES: No pedal edema, cyanosis, or clubbing.  NEUROLOGIC: Cranial nerves II through XII are intact. Muscle strength 5/5 in all extremities. Sensation intact. Gait not checked.  PSYCHIATRIC: The patient is alert and oriented x 3.  SKIN: No obvious rash, lesion, or ulcer.   DATA REVIEW:   CBC   Recent Labs Lab 01/30/15 0345  WBC 2.5*  HGB 9.1*  HCT 27.6*  PLT 114*    Chemistries   Recent Labs Lab 01/29/15 0505  NA 139  K 3.8  CL 108  CO2 26  GLUCOSE 152*  BUN 15  CREATININE 1.29*  CALCIUM 8.7*  MG 2.2    Microbiology Results   No results found for this or any previous visit (from the past  240 hour(s)).  RADIOLOGY:  Dg Chest Port 1 View  01/28/2015   CLINICAL DATA:  Left chest pain.  EXAM: PORTABLE CHEST - 1 VIEW  COMPARISON:  01/13/2015  FINDINGS: Prior CABG. The lungs appear clear. Heart size within normal limits. No pleural effusion.  IMPRESSION: 1. No significant abnormality to explain the patient's chest pain.   Electronically Signed   By: Van Clines M.D.   On: 01/28/2015 19:52   Management plans discussed with the patient, family and they are in agreement.  CODE STATUS:     Code Status Orders        Start     Ordered   01/28/15 2356  Full code   Continuous     01/28/15 2355    Advance Directive Documentation        Most Recent Value   Type of Advance Directive  Living will   Pre-existing out of facility DNR order (yellow form or pink MOST form)     "MOST" Form in Place?        TOTAL TIME TAKING CARE OF THIS PATIENT: 40 minutes.    Teyonna Plaisted M.D on 01/30/2015 at 9:16 AM  Between 7am to 6pm - Pager - [phone] After 6pm go to [email or web address] - password [email or web address]  Office   [phone]  CC: Primary care physician; Morton Peters, MD

## 2015-01-30 NOTE — Discharge Instructions (Signed)
Wear oxygen as instructed

## 2015-01-31 LAB — HEPATITIS B SURFACE ANTIGEN: Hepatitis B Surface Ag: NEGATIVE

## 2015-02-01 ENCOUNTER — Encounter: Payer: Self-pay | Admitting: Cardiovascular Disease

## 2015-02-01 ENCOUNTER — Ambulatory Visit (INDEPENDENT_AMBULATORY_CARE_PROVIDER_SITE_OTHER): Payer: Medicare PPO | Admitting: Cardiovascular Disease

## 2015-02-01 VITALS — BP 110/54 | HR 68 | Ht 68.0 in | Wt 163.0 lb

## 2015-02-01 DIAGNOSIS — E785 Hyperlipidemia, unspecified: Secondary | ICD-10-CM | POA: Diagnosis not present

## 2015-02-01 DIAGNOSIS — I2510 Atherosclerotic heart disease of native coronary artery without angina pectoris: Secondary | ICD-10-CM | POA: Diagnosis not present

## 2015-02-01 DIAGNOSIS — I10 Essential (primary) hypertension: Secondary | ICD-10-CM | POA: Diagnosis not present

## 2015-02-01 DIAGNOSIS — R079 Chest pain, unspecified: Secondary | ICD-10-CM

## 2015-02-01 DIAGNOSIS — Z951 Presence of aortocoronary bypass graft: Secondary | ICD-10-CM

## 2015-02-01 DIAGNOSIS — R0902 Hypoxemia: Secondary | ICD-10-CM | POA: Insufficient documentation

## 2015-02-01 LAB — METHYLMALONIC ACID, SERUM: Methylmalonic Acid, Quantitative: 215 nmol/L (ref 0–378)

## 2015-02-01 LAB — HEPATITIS B CORE ANTIBODY, TOTAL: Hep B Core Total Ab: NEGATIVE

## 2015-02-01 LAB — HEPATITIS C ANTIBODY: HCV Ab: <0.1 s/co ratio (ref 0.0–0.9)

## 2015-02-01 NOTE — Assessment & Plan Note (Signed)
Blood pressure is well controlled on today's visit. No changes made to the medications. 

## 2015-02-01 NOTE — Assessment & Plan Note (Signed)
Currently with no symptoms of angina. No further workup at this time. Continue current medication regimen. Recent negation stress test

## 2015-02-01 NOTE — Patient Instructions (Signed)
You are doing well. No medication changes were made.  Please call us if you have new issues that need to be addressed before your next appt.  Your physician wants you to follow-up in: 6 months.  You will receive a reminder letter in the mail two months in advance. If you don't receive a letter, please call our office to schedule the follow-up appointment.   

## 2015-02-01 NOTE — Assessment & Plan Note (Signed)
On oxygen 2 liters, Likely secondary to anemia. No wheezing on exam today. Lungs clear

## 2015-02-01 NOTE — Progress Notes (Signed)
Patient ID: Frank Mcdaniel, male    DOB: Sep 16, 1932, 81 y.o.   MRN: [id number]  HPI Comments: 81 year old gentleman with a history of coronary artery disease, bypass in July 2000, hairy cell leukemia,  history of hyperlipidemia.who presents for routine followup of his coronary artery disease.  He has chronic lower extremity edema, worse on the right than the left. Recent admission to the hospital June 2016 for shortness of breath Diagnosed with asthma exacerbation  Seen by cardiology and oncology Stress test and echocardiogram was normal, no ischemia.  Recent blood work showing up in his hematocrit from 33 last year down to 29 in May 2016, now 27 He presents today on oxygen for shortness of breath. Reports lungs are clear, no wheezing He reports oncology was concerned about his anemia. He has follow-up with them in clinic next week He does report hypoxia when walking with no oxygen  EKG on today's visit shows normal sinus rhythm with rate 68 bpm, right bundle branch block  Other past medical history  slipping on the ice in January 2016, possibly landed on his left arm, had chest pressure, described as a dullness in his left pectoral area. He went to the emergency room and evaluation showed negative cardiac enzymes, otherwise normal lab work including CBC, basic metabolic panel, EKG unchanged, chest x-ray normal. He was discharged home with follow-up today Possibly had one or 2 very short episodes of fleeting low-grade almost in the left chest. Again he wonders if it could be musculoskeletal from recent fall  Previous echocardiogram March 2007 was essentially normal Last stress test March 2010 was a lexiscan, ejection fraction 65%, no ischemia noted, no EKG changes Total cholesterol 118, LDL 56, HDL 54    No Known Allergies  Outpatient Encounter Prescriptions as of 02/01/2015  Medication Sig  . acyclovir (ZOVIRAX) 400 MG tablet Take 1 tablet (400 mg total) by mouth 2 (two) times  daily.  [name] aspirin 81 MG EC tablet Take 2 tablets (162 mg total) by mouth daily.  [name] atorvastatin (LIPITOR) 40 MG tablet Take 20 mg by mouth daily.   . dapsone 100 MG tablet TAKE 1 TABLET BY MOUTH EVERY DAY  . mometasone-formoterol (DULERA) 200-5 MCG/ACT AERO Inhale 2 puffs into the lungs 2 (two) times daily.  . Multiple Vitamin (MULTIVITAMIN) tablet Take 1 tablet by mouth daily.    . OXYGEN Inhale 2 L/min into the lungs as needed (for breathing). Oxygen @@ 2 liters via nasal cannula as needed through the day.  . pantoprazole (PROTONIX) 40 MG tablet Take 40 mg by mouth daily.  . ranitidine (ZANTAC) 150 MG capsule Take 150 mg by mouth 2 (two) times daily as needed for heartburn.   . tamsulosin (FLOMAX) 0.4 MG CAPS capsule Take 0.4 mg by mouth daily.  [name] tiotropium (SPIRIVA) 18 MCG inhalation capsule Place 1 capsule (18 mcg total) into inhaler and inhale daily.   No facility-administered encounter medications on file as of 02/01/2015.    Past Medical History  Diagnosis Date  . Coronary artery disease     a. 2000 s/p CABG;  b. 10/2008 Neg MV, EF 64%.  [name] BPH (benign prostatic hyperplasia)   . Hyperlipidemia   . Leukemia   . H/O echocardiogram     a. 10/2005 Echo: nl EF.  [name] Atypical chest pain     a. 08/2014  . Asymptomatic Sinus Bradycardia   . Asthma   . Hypertension   . GERD (gastroesophageal reflux disease)   . HTN (  hypertension)   . CKD (chronic kidney disease), stage III     Past Surgical History  Procedure Laterality Date  . Coronary artery bypass graft  2000  . Cataract extraction      Social History  reports that he has never smoked. He has never used smokeless tobacco. He reports that he does not drink alcohol or use illicit drugs.  Family History family history includes Other in an other family member.       Review of Systems  Constitutional: Negative.   Eyes: Negative.   Respiratory: Positive for chest tightness.   Cardiovascular: Negative.   Gastrointestinal:  Negative.   Musculoskeletal: Negative.   Neurological: Negative.   Hematological: Negative.   Psychiatric/Behavioral: Negative.   All other systems reviewed and are negative.  BP 110/54 mmHg  Pulse 68  Ht 5\' 8"  (1.727 m)  Wt 163 lb (73.936 kg)  BMI 24.79 kg/m2  Physical Exam  Constitutional: He is oriented to person, place, and time. He appears well-developed and well-nourished.  HENT:  Head: Normocephalic.  Nose: Nose normal.  Mouth/Throat: Oropharynx is clear and moist.  Eyes: Conjunctivae are normal. Pupils are equal, round, and reactive to light.  Neck: Normal range of motion. Neck supple. No JVD present.  Cardiovascular: Normal rate, regular rhythm, S1 normal, S2 normal, normal heart sounds and intact distal pulses.  Exam reveals no gallop and no friction rub.   No murmur heard. Pulmonary/Chest: Effort normal and breath sounds normal. No respiratory distress. He has no wheezes. He has no rales. He exhibits no tenderness.  Abdominal: Soft. Bowel sounds are normal. He exhibits no distension. There is no tenderness.  Musculoskeletal: Normal range of motion. He exhibits no edema or tenderness.  Lymphadenopathy:    He has no cervical adenopathy.  Neurological: He is alert and oriented to person, place, and time. Coordination normal.  Skin: Skin is warm and dry. No rash noted. No erythema.  Psychiatric: He has a normal mood and affect. His behavior is normal. Judgment and thought content normal.      Assessment and Plan   Nursing note and vitals reviewed.

## 2015-02-01 NOTE — Assessment & Plan Note (Signed)
Cholesterol is at goal on the current lipid regimen. No changes to the medications were made.  

## 2015-02-01 NOTE — Assessment & Plan Note (Signed)
Currently with no symptoms of angina. No further workup at this time. Continue current medication regimen. 

## 2015-02-02 ENCOUNTER — Ambulatory Visit: Payer: Medicare PPO | Admitting: Oncology

## 2015-02-09 ENCOUNTER — Inpatient Hospital Stay: Payer: Medicare PPO

## 2015-02-09 ENCOUNTER — Inpatient Hospital Stay: Payer: Medicare PPO | Attending: Oncology | Admitting: Oncology

## 2015-02-09 ENCOUNTER — Other Ambulatory Visit: Payer: Self-pay | Admitting: Oncology

## 2015-02-09 DIAGNOSIS — K219 Gastro-esophageal reflux disease without esophagitis: Secondary | ICD-10-CM | POA: Insufficient documentation

## 2015-02-09 DIAGNOSIS — I2510 Atherosclerotic heart disease of native coronary artery without angina pectoris: Secondary | ICD-10-CM

## 2015-02-09 DIAGNOSIS — R531 Weakness: Secondary | ICD-10-CM | POA: Diagnosis not present

## 2015-02-09 DIAGNOSIS — I129 Hypertensive chronic kidney disease with stage 1 through stage 4 chronic kidney disease, or unspecified chronic kidney disease: Secondary | ICD-10-CM | POA: Diagnosis not present

## 2015-02-09 DIAGNOSIS — D61818 Other pancytopenia: Secondary | ICD-10-CM | POA: Diagnosis not present

## 2015-02-09 DIAGNOSIS — C9590 Leukemia, unspecified not having achieved remission: Secondary | ICD-10-CM

## 2015-02-09 DIAGNOSIS — Z951 Presence of aortocoronary bypass graft: Secondary | ICD-10-CM | POA: Diagnosis not present

## 2015-02-09 DIAGNOSIS — Z5111 Encounter for antineoplastic chemotherapy: Secondary | ICD-10-CM | POA: Insufficient documentation

## 2015-02-09 DIAGNOSIS — N183 Chronic kidney disease, stage 3 (moderate): Secondary | ICD-10-CM

## 2015-02-09 DIAGNOSIS — Z79899 Other long term (current) drug therapy: Secondary | ICD-10-CM | POA: Diagnosis not present

## 2015-02-09 DIAGNOSIS — R5383 Other fatigue: Secondary | ICD-10-CM

## 2015-02-09 DIAGNOSIS — R0609 Other forms of dyspnea: Secondary | ICD-10-CM | POA: Insufficient documentation

## 2015-02-09 DIAGNOSIS — E785 Hyperlipidemia, unspecified: Secondary | ICD-10-CM | POA: Insufficient documentation

## 2015-02-09 DIAGNOSIS — C9140 Hairy cell leukemia not having achieved remission: Secondary | ICD-10-CM

## 2015-02-09 DIAGNOSIS — R110 Nausea: Secondary | ICD-10-CM

## 2015-02-09 DIAGNOSIS — Z7982 Long term (current) use of aspirin: Secondary | ICD-10-CM | POA: Insufficient documentation

## 2015-02-09 DIAGNOSIS — N400 Enlarged prostate without lower urinary tract symptoms: Secondary | ICD-10-CM | POA: Diagnosis not present

## 2015-02-09 LAB — BASIC METABOLIC PANEL
ANION GAP: 4 — AB (ref 5–15)
BUN: 12 mg/dL (ref 6–20)
CALCIUM: 8.6 mg/dL — AB (ref 8.9–10.3)
CO2: 25 mmol/L (ref 22–32)
CREATININE: 1.25 mg/dL — AB (ref 0.61–1.24)
Chloride: 108 mmol/L (ref 101–111)
GFR calc Af Amer: >60 mL/min (ref >60)
GFR calc non Af Amer: 53 mL/min — ABNORMAL LOW (ref >60)
Glucose, Bld: 108 mg/dL — ABNORMAL HIGH (ref 65–99)
Potassium: 3.9 mmol/L (ref 3.5–5.1)
Sodium: 137 mmol/L (ref 135–145)

## 2015-02-09 LAB — CBC WITH DIFFERENTIAL/PLATELET
BASOS ABS: 0 10*3/uL (ref 0–0.1)
BASOS PCT: 1 %
EOS ABS: 0 10*3/uL (ref 0–0.7)
Eosinophils Relative: 1 %
HCT: 32.5 % — ABNORMAL LOW (ref 40.0–52.0)
HEMOGLOBIN: 10.2 g/dL — AB (ref 13.0–18.0)
Lymphocytes Relative: 21 %
Lymphs Abs: 0.4 10*3/uL — ABNORMAL LOW (ref 1.0–3.6)
MCH: 38.9 pg — AB (ref 26.0–34.0)
MCHC: 31.3 g/dL — AB (ref 32.0–36.0)
MCV: 124.2 fL — ABNORMAL HIGH (ref 80.0–100.0)
MONOS PCT: 3 %
Monocytes Absolute: 0.1 10*3/uL — ABNORMAL LOW (ref 0.2–1.0)
NEUTROS PCT: 74 %
Neutro Abs: 1.6 10*3/uL (ref 1.4–6.5)
Platelets: 142 10*3/uL — ABNORMAL LOW (ref 150–440)
RBC: 2.61 MIL/uL — ABNORMAL LOW (ref 4.40–5.90)
RDW: 17.8 % — AB (ref 11.5–14.5)
WBC: 2.1 10*3/uL — ABNORMAL LOW (ref 3.8–10.6)

## 2015-02-09 LAB — TSH: TSH: 0.921 u[IU]/mL (ref 0.350–4.500)

## 2015-02-10 ENCOUNTER — Ambulatory Visit: Payer: Medicare PPO | Admitting: Internal Medicine

## 2015-02-14 ENCOUNTER — Ambulatory Visit
Admission: RE | Admit: 2015-02-14 | Discharge: 2015-02-14 | Disposition: A | Discharge status: Home / Self Care | Payer: Medicare PPO | Source: Ambulatory Visit | Attending: Oncology | Admitting: Oncology

## 2015-02-14 DIAGNOSIS — R001 Bradycardia, unspecified: Secondary | ICD-10-CM | POA: Diagnosis not present

## 2015-02-14 DIAGNOSIS — E785 Hyperlipidemia, unspecified: Secondary | ICD-10-CM | POA: Insufficient documentation

## 2015-02-14 DIAGNOSIS — I2510 Atherosclerotic heart disease of native coronary artery without angina pectoris: Secondary | ICD-10-CM | POA: Insufficient documentation

## 2015-02-14 DIAGNOSIS — I10 Essential (primary) hypertension: Secondary | ICD-10-CM | POA: Diagnosis not present

## 2015-02-14 DIAGNOSIS — N400 Enlarged prostate without lower urinary tract symptoms: Secondary | ICD-10-CM | POA: Insufficient documentation

## 2015-02-14 DIAGNOSIS — Z01812 Encounter for preprocedural laboratory examination: Secondary | ICD-10-CM | POA: Insufficient documentation

## 2015-02-14 DIAGNOSIS — R0902 Hypoxemia: Secondary | ICD-10-CM | POA: Diagnosis not present

## 2015-02-14 DIAGNOSIS — C9140 Hairy cell leukemia not having achieved remission: Secondary | ICD-10-CM | POA: Insufficient documentation

## 2015-02-14 DIAGNOSIS — Z951 Presence of aortocoronary bypass graft: Secondary | ICD-10-CM | POA: Insufficient documentation

## 2015-02-14 DIAGNOSIS — N183 Chronic kidney disease, stage 3 (moderate): Secondary | ICD-10-CM | POA: Diagnosis not present

## 2015-02-14 LAB — CBC
HCT: 27.5 % — ABNORMAL LOW (ref 40.0–52.0)
HEMOGLOBIN: 9 g/dL — AB (ref 13.0–18.0)
MCH: 38.4 pg — ABNORMAL HIGH (ref 26.0–34.0)
MCHC: 32.9 g/dL (ref 32.0–36.0)
MCV: 116.6 fL — AB (ref 80.0–100.0)
Platelets: 114 10*3/uL — ABNORMAL LOW (ref 150–440)
RBC: 2.36 MIL/uL — ABNORMAL LOW (ref 4.40–5.90)
RDW: 16 % — AB (ref 11.5–14.5)
WBC: 1.5 10*3/uL — ABNORMAL LOW (ref 3.8–10.6)

## 2015-02-14 LAB — DIFFERENTIAL
BAND NEUTROPHILS: 0 % (ref 0–10)
BASOS ABS: 0 10*3/uL (ref 0.0–0.1)
BLASTS: 0 %
Basophils Relative: 0 % (ref 0–1)
Eosinophils Absolute: 0 10*3/uL (ref 0.0–0.7)
Eosinophils Relative: 2 % (ref 0–5)
LYMPHS PCT: 14 % (ref 12–46)
Lymphs Abs: 0.2 10*3/uL — ABNORMAL LOW (ref 0.7–4.0)
MONOS PCT: 3 % (ref 3–12)
Metamyelocytes Relative: 0 %
Monocytes Absolute: 0 10*3/uL — ABNORMAL LOW (ref 0.1–1.0)
Myelocytes: 0 %
Neutro Abs: 1.3 10*3/uL — ABNORMAL LOW (ref 1.7–7.7)
Neutrophils Relative %: 81 % — ABNORMAL HIGH (ref 43–77)
Other: 0 %
Promyelocytes Absolute: 0 %
SMEAR REVIEW: DECREASED
nRBC: 0 /100 WBC

## 2015-02-14 LAB — PROTIME-INR
INR: 1.14
Prothrombin Time: 14.8 seconds (ref 11.4–15.0)

## 2015-02-14 MED ORDER — BUPIVACAINE HCL (PF) 0.25 % IJ SOLN
INTRAMUSCULAR | Status: AC | PRN
  Administered 2015-02-14: 10 mL

## 2015-02-14 MED ORDER — SODIUM CHLORIDE 0.9 % IV SOLN: INTRAVENOUS | Status: DC

## 2015-02-14 MED ORDER — HYDROCODONE-ACETAMINOPHEN 5-325 MG PO TABS: 1.0000 | ORAL_TABLET | ORAL | Status: DC | PRN

## 2015-02-14 NOTE — Procedures (Signed)
Ct guided bone marrow biopsy  Complications:  None  Blood Loss: none  See dictation in canopy pacs

## 2015-02-21 ENCOUNTER — Inpatient Hospital Stay (HOSPITAL_BASED_OUTPATIENT_CLINIC_OR_DEPARTMENT_OTHER): Payer: Medicare PPO | Admitting: Oncology

## 2015-02-21 VITALS — BP 137/72 | HR 55 | Temp 98.2°F | Resp 20 | Wt 162.7 lb

## 2015-02-21 DIAGNOSIS — E785 Hyperlipidemia, unspecified: Secondary | ICD-10-CM

## 2015-02-21 DIAGNOSIS — Z5111 Encounter for antineoplastic chemotherapy: Secondary | ICD-10-CM | POA: Diagnosis not present

## 2015-02-21 DIAGNOSIS — C9140 Hairy cell leukemia not having achieved remission: Secondary | ICD-10-CM | POA: Diagnosis not present

## 2015-02-21 DIAGNOSIS — I129 Hypertensive chronic kidney disease with stage 1 through stage 4 chronic kidney disease, or unspecified chronic kidney disease: Secondary | ICD-10-CM

## 2015-02-21 DIAGNOSIS — D61818 Other pancytopenia: Secondary | ICD-10-CM | POA: Diagnosis not present

## 2015-02-21 DIAGNOSIS — Z951 Presence of aortocoronary bypass graft: Secondary | ICD-10-CM

## 2015-02-21 DIAGNOSIS — R5383 Other fatigue: Secondary | ICD-10-CM

## 2015-02-21 DIAGNOSIS — R531 Weakness: Secondary | ICD-10-CM

## 2015-02-21 DIAGNOSIS — K219 Gastro-esophageal reflux disease without esophagitis: Secondary | ICD-10-CM

## 2015-02-21 DIAGNOSIS — R0609 Other forms of dyspnea: Secondary | ICD-10-CM | POA: Diagnosis not present

## 2015-02-21 DIAGNOSIS — N183 Chronic kidney disease, stage 3 (moderate): Secondary | ICD-10-CM

## 2015-02-21 DIAGNOSIS — I2510 Atherosclerotic heart disease of native coronary artery without angina pectoris: Secondary | ICD-10-CM

## 2015-02-21 DIAGNOSIS — R110 Nausea: Secondary | ICD-10-CM

## 2015-02-21 MED ORDER — SULFAMETHOXAZOLE-TRIMETHOPRIM 800-160 MG PO TABS: 1.0000 | ORAL_TABLET | ORAL | Status: DC

## 2015-02-21 MED ORDER — VALACYCLOVIR HCL 1 G PO TABS: 1000.0000 mg | ORAL_TABLET | Freq: Every day | ORAL | Status: DC

## 2015-02-22 ENCOUNTER — Other Ambulatory Visit: Payer: Self-pay | Admitting: Specialist

## 2015-02-22 ENCOUNTER — Ambulatory Visit: Payer: Medicare PPO | Attending: Specialist

## 2015-02-22 DIAGNOSIS — R0602 Shortness of breath: Secondary | ICD-10-CM | POA: Diagnosis not present

## 2015-02-22 DIAGNOSIS — R0902 Hypoxemia: Secondary | ICD-10-CM

## 2015-02-22 LAB — BLOOD GAS, ARTERIAL
ALLENS TEST (PASS/FAIL): POSITIVE — AB
Acid-Base Excess: 0.7 mmol/L (ref 0.0–3.0)
Bicarbonate: 24.3 mEq/L (ref 21.0–28.0)
FIO2: 21 %
O2 Saturation: 94.1 %
PCO2 ART: 35 mmHg (ref 32.0–48.0)
PO2 ART: 82 mmHg — AB (ref 83.0–108.0)
pH, Arterial: 7.45 (ref 7.350–7.450)

## 2015-02-27 ENCOUNTER — Inpatient Hospital Stay: Payer: Medicare PPO

## 2015-02-27 ENCOUNTER — Inpatient Hospital Stay (HOSPITAL_BASED_OUTPATIENT_CLINIC_OR_DEPARTMENT_OTHER): Payer: Medicare PPO | Admitting: Oncology

## 2015-02-27 ENCOUNTER — Other Ambulatory Visit: Payer: Self-pay

## 2015-02-27 VITALS — BP 148/66 | HR 72 | Temp 98.1°F | Resp 18 | Wt 159.4 lb

## 2015-02-27 DIAGNOSIS — C9140 Hairy cell leukemia not having achieved remission: Secondary | ICD-10-CM | POA: Diagnosis not present

## 2015-02-27 DIAGNOSIS — N183 Chronic kidney disease, stage 3 (moderate): Secondary | ICD-10-CM

## 2015-02-27 DIAGNOSIS — R531 Weakness: Secondary | ICD-10-CM

## 2015-02-27 DIAGNOSIS — E785 Hyperlipidemia, unspecified: Secondary | ICD-10-CM

## 2015-02-27 DIAGNOSIS — I2510 Atherosclerotic heart disease of native coronary artery without angina pectoris: Secondary | ICD-10-CM

## 2015-02-27 DIAGNOSIS — K219 Gastro-esophageal reflux disease without esophagitis: Secondary | ICD-10-CM

## 2015-02-27 DIAGNOSIS — I129 Hypertensive chronic kidney disease with stage 1 through stage 4 chronic kidney disease, or unspecified chronic kidney disease: Secondary | ICD-10-CM

## 2015-02-27 DIAGNOSIS — R110 Nausea: Secondary | ICD-10-CM

## 2015-02-27 DIAGNOSIS — R0609 Other forms of dyspnea: Secondary | ICD-10-CM | POA: Diagnosis not present

## 2015-02-27 DIAGNOSIS — R5383 Other fatigue: Secondary | ICD-10-CM

## 2015-02-27 DIAGNOSIS — D61818 Other pancytopenia: Secondary | ICD-10-CM | POA: Diagnosis not present

## 2015-02-27 DIAGNOSIS — Z951 Presence of aortocoronary bypass graft: Secondary | ICD-10-CM

## 2015-02-27 DIAGNOSIS — Z5111 Encounter for antineoplastic chemotherapy: Secondary | ICD-10-CM | POA: Diagnosis not present

## 2015-02-27 LAB — COMPREHENSIVE METABOLIC PANEL
ALT: 10 U/L — AB (ref 17–63)
AST: 25 U/L (ref 15–41)
Albumin: 4.3 g/dL (ref 3.5–5.0)
Alkaline Phosphatase: 56 U/L (ref 38–126)
Anion gap: 6 (ref 5–15)
BUN: 15 mg/dL (ref 6–20)
CO2: 24 mmol/L (ref 22–32)
CREATININE: 1.51 mg/dL — AB (ref 0.61–1.24)
Calcium: 8.7 mg/dL — ABNORMAL LOW (ref 8.9–10.3)
Chloride: 106 mmol/L (ref 101–111)
GFR calc Af Amer: 49 mL/min — ABNORMAL LOW (ref >60)
GFR calc non Af Amer: 42 mL/min — ABNORMAL LOW (ref >60)
Glucose, Bld: 128 mg/dL — ABNORMAL HIGH (ref 65–99)
Potassium: 3.6 mmol/L (ref 3.5–5.1)
Sodium: 136 mmol/L (ref 135–145)
Total Bilirubin: 1.2 mg/dL (ref 0.3–1.2)
Total Protein: 6.5 g/dL (ref 6.5–8.1)

## 2015-02-27 LAB — CBC WITH DIFFERENTIAL/PLATELET
Basophils Absolute: 0 10*3/uL (ref 0–0.1)
Basophils Relative: 0 %
Eosinophils Absolute: 0 10*3/uL (ref 0–0.7)
Eosinophils Relative: 2 %
HCT: 25.6 % — ABNORMAL LOW (ref 40.0–52.0)
HEMOGLOBIN: 8.4 g/dL — AB (ref 13.0–18.0)
LYMPHS ABS: 0.3 10*3/uL — AB (ref 1.0–3.6)
Lymphocytes Relative: 17 %
MCH: 38.1 pg — AB (ref 26.0–34.0)
MCHC: 32.6 g/dL (ref 32.0–36.0)
MCV: 116.7 fL — AB (ref 80.0–100.0)
MONO ABS: 0.1 10*3/uL — AB (ref 0.2–1.0)
Monocytes Relative: 4 %
NEUTROS PCT: 77 %
Neutro Abs: 1.2 10*3/uL — ABNORMAL LOW (ref 1.4–6.5)
PLATELETS: 114 10*3/uL — AB (ref 150–440)
RBC: 2.2 MIL/uL — ABNORMAL LOW (ref 4.40–5.90)
RDW: 17 % — ABNORMAL HIGH (ref 11.5–14.5)
WBC: 1.6 10*3/uL — AB (ref 3.8–10.6)

## 2015-02-27 MED ORDER — PROCHLORPERAZINE MALEATE 10 MG PO TABS
10.0000 mg | ORAL_TABLET | Freq: Once | ORAL | Status: AC
  Administered 2015-02-27: 10 mg via ORAL
  Filled 2015-02-27: qty 1

## 2015-02-27 MED ORDER — SODIUM CHLORIDE 0.9 % IV SOLN
Freq: Once | INTRAVENOUS | Status: AC
  Administered 2015-02-27: 11:00:00 via INTRAVENOUS
  Filled 2015-02-27: qty 8

## 2015-02-27 MED ORDER — DIPHENHYDRAMINE HCL 25 MG PO CAPS
50.0000 mg | ORAL_CAPSULE | Freq: Once | ORAL | Status: AC
  Administered 2015-02-27: 50 mg via ORAL
  Filled 2015-02-27: qty 2

## 2015-02-27 MED ORDER — SODIUM CHLORIDE 0.9 % IJ SOLN
10.0000 mL | INTRAMUSCULAR | Status: DC | PRN
  Filled 2015-02-27: qty 10

## 2015-02-27 MED ORDER — SODIUM CHLORIDE 0.9 % IV SOLN
375.0000 mg/m2 | Freq: Once | INTRAVENOUS | Status: AC
  Administered 2015-02-27: 700 mg via INTRAVENOUS
  Filled 2015-02-27: qty 60

## 2015-02-27 MED ORDER — ACETAMINOPHEN 325 MG PO TABS
650.0000 mg | ORAL_TABLET | Freq: Once | ORAL | Status: AC
  Administered 2015-02-27: 650 mg via ORAL
  Filled 2015-02-27: qty 2

## 2015-02-27 MED ORDER — PROCHLORPERAZINE MALEATE 10 MG PO TABS: 10.0000 mg | ORAL_TABLET | Freq: Four times a day (QID) | ORAL | Status: DC | PRN

## 2015-02-27 MED ORDER — SODIUM CHLORIDE 0.9 % IV SOLN
Freq: Once | INTRAVENOUS | Status: AC
  Administered 2015-02-27: 11:00:00 via INTRAVENOUS
  Filled 2015-02-27: qty 1000

## 2015-02-27 MED ORDER — SODIUM CHLORIDE 0.9 % IV SOLN
Freq: Once | INTRAVENOUS | Status: AC
  Administered 2015-02-27: 13:00:00 via INTRAVENOUS
  Filled 2015-02-27: qty 1000

## 2015-02-27 MED ORDER — SODIUM CHLORIDE 0.9 % IV SOLN
0.1500 mg/kg | Freq: Once | INTRAVENOUS | Status: AC
  Administered 2015-02-27: 11 mg via INTRAVENOUS
  Filled 2015-02-27: qty 11

## 2015-02-27 NOTE — Progress Notes (Signed)
[facility]  Telephone:([phone] [phone]  ID: Kalab Camps Dickens OB: 1933-02-25  MR#: [id number]  AJG#:[id number]  Patient Care Team: Morton Peters., MD as PCP - General (Unknown Physician Specialty) [name], MD as Consulting Physician (Oncology)  CHIEF COMPLAINT:  Chief Complaint  Patient presents with  . Follow-up    results    INTERVAL HISTORY: Patient returns to clinic today for further evaluation and discussion of his bone marrow biopsy results.  He continues to feel significantly weak and fatigued. He continues to have shortness of breath and dyspnea exertion. He continues to have occasional nausea. He denies any fevers. He has no neurologic complaints. He denies any chest pain. He denies any weight loss. He has no urinary complaints. Patient otherwise feels well and offers no further specific complaints.   REVIEW OF SYSTEMS:   Review of Systems  Constitutional: Positive for malaise/fatigue. Negative for fever.  Respiratory: Positive for shortness of breath.   Cardiovascular: Negative.   Gastrointestinal: Positive for nausea. Negative for vomiting.  Musculoskeletal: Negative.   Neurological: Positive for weakness.    As per HPI. Otherwise, a complete review of systems is negatve.  PAST MEDICAL HISTORY: Past Medical History  Diagnosis Date  . Coronary artery disease     a. 2000 s/p CABG;  b. 10/2008 Neg MV, EF 64%.  [name] BPH (benign prostatic hyperplasia)   . Hyperlipidemia   . Leukemia   . H/O echocardiogram     a. 10/2005 Echo: nl EF.  [name] Atypical chest pain     a. 08/2014  . Asymptomatic Sinus Bradycardia   . Asthma   . Hypertension   . GERD (gastroesophageal reflux disease)   . HTN (hypertension)   . CKD (chronic kidney disease), stage III   . COPD (chronic obstructive pulmonary disease)   . Shortness of breath dyspnea   . Dysrhythmia     PAST SURGICAL HISTORY: Past Surgical History  Procedure Laterality  Date  . Coronary artery bypass graft  2000  . Cataract extraction      FAMILY HISTORY Family History  Problem Relation Age of Onset  . Other      no premature CAD.       ADVANCED DIRECTIVES:    HEALTH MAINTENANCE: History  Substance Use Topics  . Smoking status: Never Smoker   . Smokeless tobacco: Never Used  . Alcohol Use: No     Colonoscopy:  PAP:  Bone density:  Lipid panel:  No Known Allergies  Current Outpatient Prescriptions  Medication Sig Dispense Refill  . aspirin 81 MG EC tablet Take 2 tablets (162 mg total) by mouth daily. 30 tablet 0  . atorvastatin (LIPITOR) 40 MG tablet Take 20 mg by mouth daily.     . dapsone 100 MG tablet TAKE 1 TABLET BY MOUTH EVERY DAY 30 tablet 3  . Multiple Vitamin (MULTIVITAMIN) tablet Take 1 tablet by mouth daily.      . OXYGEN Inhale 2 L/min into the lungs as needed (for breathing). Oxygen @@ 2 liters via nasal cannula as needed through the day.    . pantoprazole (PROTONIX) 40 MG tablet Take 40 mg by mouth daily.    . ranitidine (ZANTAC) 150 MG capsule Take 150 mg by mouth 2 (two) times daily as needed for heartburn.     . tamsulosin (FLOMAX) 0.4 MG CAPS capsule Take 0.4 mg by mouth daily.    [name] tiotropium (SPIRIVA) 18 MCG inhalation capsule Place 1 capsule (18  mcg total) into inhaler and inhale daily. 30 capsule 12  . prochlorperazine (COMPAZINE) 10 MG tablet Take 1 tablet (10 mg total) by mouth every 6 (six) hours as needed for nausea or vomiting. 30 tablet 0  . sulfamethoxazole-trimethoprim (BACTRIM DS,SEPTRA DS) 800-160 MG per tablet Take 1 tablet by mouth 3 (three) times a week. 30 tablet 3  . valACYclovir (VALTREX) 1000 MG tablet Take 1 tablet (1,000 mg total) by mouth daily. 30 tablet 3   No current facility-administered medications for this visit.   Facility-Administered Medications Ordered in Other Visits  Medication Dose Route Frequency Provider Last Rate Last Dose  . sodium chloride 0.9 % injection 10 mL  10 mL  Intracatheter PRN [name], MD      . sodium chloride 0.9 % injection 10 mL  10 mL Intracatheter PRN [name], MD        OBJECTIVE: Filed Vitals:   02/21/15 1124  BP: 137/72  Pulse: 55  Temp: 98.2 F (36.8 C)  Resp: 20     Body mass index is 24.74 kg/(m^2).    ECOG FS:2 - Symptomatic, <50% confined to bed  General: Well-developed, well-nourished, no acute distress. Eyes: anicteric sclera. Lungs: Clear to auscultation bilaterally. Heart: Regular rate and rhythm. No rubs, murmurs, or gallops. Abdomen: Soft, nontender, nondistended. No organomegaly noted, normoactive bowel sounds. Musculoskeletal: No edema, cyanosis, or clubbing. Neuro: Alert, answering all questions appropriately. Cranial nerves grossly intact. Skin: No rashes or petechiae noted. Psych: Normal affect.   LAB RESULTS:  Lab Results  Component Value Date   NA 136 02/27/2015   K 3.6 02/27/2015   CL 106 02/27/2015   CO2 24 02/27/2015   GLUCOSE 128* 02/27/2015   BUN 15 02/27/2015   CREATININE 1.51* 02/27/2015   CALCIUM 8.7* 02/27/2015   PROT 6.5 02/27/2015   ALBUMIN 4.3 02/27/2015   AST 25 02/27/2015   ALT 10* 02/27/2015   ALKPHOS 56 02/27/2015   BILITOT 1.2 02/27/2015   GFRNONAA 42* 02/27/2015   GFRAA 49* 02/27/2015    Lab Results  Component Value Date   WBC 1.6* 02/27/2015   NEUTROABS 1.2* 02/27/2015   HGB 8.4* 02/27/2015   HCT 25.6* 02/27/2015   MCV 116.7* 02/27/2015   PLT 114* 02/27/2015     STUDIES: Ct Biopsy  02/21/2015   CLINICAL DATA:  Hairy cell leukemia  EXAM: CT GUIDED BONE MARROW BIOPSY OF LEFT ILIAC BONE  ANESTHESIA/SEDATION: None  PROCEDURE: The procedure risks, benefits, and alternatives were explained to the patient. Questions regarding the procedure were encouraged and answered. The patient understands and consents to the procedure.  The left buttock was prepped with chlorhexidinein a sterile fashion, and a sterile drape was applied covering the operative field.  A sterile gown and sterile gloves were used for the procedure. Local anesthesia was provided with 0.25% Marcaine.  Utilizing 0.25% Marcaine as a deep periosteal anesthetic and CT fluoroscopic guidance a bone biopsy needle was placed into the left iliac bone adjacent to the sacroiliac joint. An initial bone marrow aspirate was performed and deemed adequate by the cytotechnologist. Subsequently a single core bone biopsy was performed without difficulty. The patient tolerated the procedure well was returned to his room in satisfactory condition.  COMPLICATIONS: None immediate  IMPRESSION: Successful CT-guided bone marrow biopsy of left iliac bone.   Electronically Signed   By: Inez Catalina M.D.   On: 02/21/2015 07:45   Dg Chest Port 1 View  01/28/2015   CLINICAL DATA:  Left  chest pain.  EXAM: PORTABLE CHEST - 1 VIEW  COMPARISON:  01/13/2015  FINDINGS: Prior CABG. The lungs appear clear. Heart size within normal limits. No pleural effusion.  IMPRESSION: 1. No significant abnormality to explain the patient's chest pain.   Electronically Signed   By: Van Clines M.D.   On: 01/28/2015 19:52    ASSESSMENT:  Hairy cell leukemia.  PLAN:   1. Hairy cell leukemia: Patient initially received cladribine in 2006 and then was treated for a relapse in 2011 and again in 2014. He continues to have pancytopenia which is relatively stable. Bone marrow biopsy confirmed recurrence of disease with 80-90% hairy cell leukemia noted in his specimen. After lengthy discussion with patient and his son they wish to proceed with additional chemotherapy using cladribine 0.15 mg/kg on days 1 through 5 along with Rituxan 375 mg/m2 weekly for 8 weeks. Return to clinic next Monday to initiate cycle 1. We will likely repeat bone marrow biopsy at the conclusion of his 8 weeks of Rituxan.  Patient was given prescriptions for Valtrex and Bactrim prophylaxis. 2. Pancytopenia: Stable. No intervention needed. Bone marrow biopsy as above. 3.  Shortness breath: Unclear etiology, patient has been instructed to continue to follow-up with pulmonary.  Approximately 30 minutes was spent in discussion and consultation.   Patient expressed understanding and was in agreement with this plan. He also understands that He can call clinic at any time with any questions, concerns, or complaints.     [name], MD   02/27/2015 1:35 PM

## 2015-02-27 NOTE — Progress Notes (Signed)
[facility]  [phone] [phone]  ID: Frank Mcdaniel OB: 02/27/1933  MR#: [id number]  CSN#:[id number]  Patient Care Team: Charles W Phillips Jr., MD as PCP - General (Unknown Physician Specialty) Zoey Gilkeson J Kathrin Folden, MD as Consulting Physician (Oncology)  CHIEF COMPLAINT:  Chief Complaint  Patient presents with  . Follow-up    Hairy cell leukemia    INTERVAL HISTORY: Patient returns to clinic today for continued initiate cycle 1, day 1 of cladaribine and Rituxan. He continues to feel significantly weak and fatigued. He continues to have shortness of breath and dyspnea exertion. He continues to have occasional nausea. He denies any fevers. He has no neurologic complaints. He denies any chest pain. He denies any weight loss. He has no urinary complaints. Patient otherwise feels well and offers no further specific complaints.   REVIEW OF SYSTEMS:   Review of Systems  Constitutional: Positive for malaise/fatigue. Negative for fever.  Respiratory: Positive for shortness of breath.   Cardiovascular: Negative.   Gastrointestinal: Positive for nausea. Negative for vomiting.  Musculoskeletal: Negative.   Neurological: Positive for weakness.    As per HPI. Otherwise, a complete review of systems is negatve.  PAST MEDICAL HISTORY: Past Medical History  Diagnosis Date  . Coronary artery disease     a. 2000 s/p CABG;  b. 10/2008 Neg MV, EF 64%.  . BPH (benign prostatic hyperplasia)   . Hyperlipidemia   . Leukemia   . H/O echocardiogram     a. 10/2005 Echo: nl EF.  . Atypical chest pain     a. 08/2014  . Asymptomatic Sinus Bradycardia   . Asthma   . Hypertension   . GERD (gastroesophageal reflux disease)   . HTN (hypertension)   . CKD (chronic kidney disease), stage III   . COPD (chronic obstructive pulmonary disease)   . Shortness of breath dyspnea   . Dysrhythmia     PAST SURGICAL HISTORY: Past Surgical History  Procedure  Laterality Date  . Coronary artery bypass graft  2000  . Cataract extraction      FAMILY HISTORY Family History  Problem Relation Age of Onset  . Other      no premature CAD.       ADVANCED DIRECTIVES:    HEALTH MAINTENANCE: History  Substance Use Topics  . Smoking status: Never Smoker   . Smokeless tobacco: Never Used  . Alcohol Use: No     Colonoscopy:  PAP:  Bone density:  Lipid panel:  No Known Allergies  Current Outpatient Prescriptions  Medication Sig Dispense Refill  . aspirin 81 MG EC tablet Take 2 tablets (162 mg total) by mouth daily. 30 tablet 0  . atorvastatin (LIPITOR) 40 MG tablet Take 20 mg by mouth daily.     . dapsone 100 MG tablet TAKE 1 TABLET BY MOUTH EVERY DAY 30 tablet 3  . Multiple Vitamin (MULTIVITAMIN) tablet Take 1 tablet by mouth daily.      . OXYGEN Inhale 2 L/min into the lungs as needed (for breathing). Oxygen @@ 2 liters via nasal cannula as needed through the day.    . pantoprazole (PROTONIX) 40 MG tablet Take 40 mg by mouth daily.    . ranitidine (ZANTAC) 150 MG capsule Take 150 mg by mouth 2 (two) times daily as needed for heartburn.     . sulfamethoxazole-trimethoprim (BACTRIM DS,SEPTRA DS) 800-160 MG per tablet Take 1 tablet by mouth 3 (three) times a week. 30 tablet 3  . tamsulosin (FLOMAX)   0.4 MG CAPS capsule Take 0.4 mg by mouth daily.    [name] tiotropium (SPIRIVA) 18 MCG inhalation capsule Place 1 capsule (18 mcg total) into inhaler and inhale daily. 30 capsule 12  . valACYclovir (VALTREX) 1000 MG tablet Take 1 tablet (1,000 mg total) by mouth daily. 30 tablet 3  . prochlorperazine (COMPAZINE) 10 MG tablet Take 1 tablet (10 mg total) by mouth every 6 (six) hours as needed for nausea or vomiting. 30 tablet 0   No current facility-administered medications for this visit.   Facility-Administered Medications Ordered in Other Visits  Medication Dose Route Frequency Provider Last Rate Last Dose  . sodium chloride 0.9 % injection 10 mL   10 mL Intracatheter PRN [name], MD      . sodium chloride 0.9 % injection 10 mL  10 mL Intracatheter PRN [name], MD        OBJECTIVE: Filed Vitals:   02/27/15 0859  BP: 148/66  Pulse: 72  Temp: 98.1 F (36.7 C)  Resp: 18     Body mass index is 24.24 kg/(m^2).    ECOG FS:2 - Symptomatic, <50% confined to bed  General: Well-developed, well-nourished, no acute distress. Eyes: anicteric sclera. Lungs: Clear to auscultation bilaterally. Heart: Regular rate and rhythm. No rubs, murmurs, or gallops. Abdomen: Soft, nontender, nondistended. No organomegaly noted, normoactive bowel sounds. Musculoskeletal: No edema, cyanosis, or clubbing. Neuro: Alert, answering all questions appropriately. Cranial nerves grossly intact. Skin: No rashes or petechiae noted. Psych: Normal affect.   LAB RESULTS:  Lab Results  Component Value Date   NA 136 02/27/2015   K 3.6 02/27/2015   CL 106 02/27/2015   CO2 24 02/27/2015   GLUCOSE 128* 02/27/2015   BUN 15 02/27/2015   CREATININE 1.51* 02/27/2015   CALCIUM 8.7* 02/27/2015   PROT 6.5 02/27/2015   ALBUMIN 4.3 02/27/2015   AST 25 02/27/2015   ALT 10* 02/27/2015   ALKPHOS 56 02/27/2015   BILITOT 1.2 02/27/2015   GFRNONAA 42* 02/27/2015   GFRAA 49* 02/27/2015    Lab Results  Component Value Date   WBC 1.6* 02/27/2015   NEUTROABS 1.2* 02/27/2015   HGB 8.4* 02/27/2015   HCT 25.6* 02/27/2015   MCV 116.7* 02/27/2015   PLT 114* 02/27/2015     STUDIES: Ct Biopsy  02/21/2015   CLINICAL DATA:  Hairy cell leukemia  EXAM: CT GUIDED BONE MARROW BIOPSY OF LEFT ILIAC BONE  ANESTHESIA/SEDATION: None  PROCEDURE: The procedure risks, benefits, and alternatives were explained to the patient. Questions regarding the procedure were encouraged and answered. The patient understands and consents to the procedure.  The left buttock was prepped with chlorhexidinein a sterile fashion, and a sterile drape was applied covering the operative  field. A sterile gown and sterile gloves were used for the procedure. Local anesthesia was provided with 0.25% Marcaine.  Utilizing 0.25% Marcaine as a deep periosteal anesthetic and CT fluoroscopic guidance a bone biopsy needle was placed into the left iliac bone adjacent to the sacroiliac joint. An initial bone marrow aspirate was performed and deemed adequate by the cytotechnologist. Subsequently a single core bone biopsy was performed without difficulty. The patient tolerated the procedure well was returned to his room in satisfactory condition.  COMPLICATIONS: None immediate  IMPRESSION: Successful CT-guided bone marrow biopsy of left iliac bone.   Electronically Signed   By: Inez Catalina M.D.   On: 02/21/2015 07:45   Dg Chest Port 1 View  01/28/2015   CLINICAL DATA:  Left chest pain.  EXAM: PORTABLE CHEST - 1 VIEW  COMPARISON:  01/13/2015  FINDINGS: Prior CABG. The lungs appear clear. Heart size within normal limits. No pleural effusion.  IMPRESSION: 1. No significant abnormality to explain the patient's chest pain.   Electronically Signed   By: Walter  Liebkemann M.D.   On: 01/28/2015 19:52    ASSESSMENT:  Hairy cell leukemia.  PLAN:   1. Hairy cell leukemia: Patient initially received cladribine in 2006 and then was treated for a relapse in 2011 and again in 2014. He continues to have pancytopenia which is relatively stable. Bone marrow biopsy confirmed recurrence of disease with 80-90% hairy cell leukemia noted in his specimen. Proceed with cycle 1, day 1 of cladribine 0.15 mg/kg on days 1 through 5 along with Rituxan 375 mg/m2 weekly for 8 weeks. Return to clinic in 1 week for consideration of cycle 2 of weekly Rituxan. Will likely repeat bone marrow biopsy at the conclusion of his 8 weeks of Rituxan.  Patient was given prescriptions for Valtrex and Bactrim for prophylaxis. 2. Pancytopenia: Stable. No intervention needed. Bone marrow biopsy as above. 3. Shortness breath: Unclear etiology,  patient has been instructed to continue to follow-up with pulmonary. Patient had an arterial blood gas drawn on February 22, 2015 without supplemental oxygen that reported an oxygen saturation of 94% and a PO2 of 82.   Patient expressed understanding and was in agreement with this plan. He also understands that He can call clinic at any time with any questions, concerns, or complaints.      J , MD   02/27/2015 2:01 PM     

## 2015-02-27 NOTE — Progress Notes (Signed)
[facility]  [phone] [phone]  ID: Crist Kruszka [place] OB: 02/20/1933  MR#: [id number]  [facility]#:[id number]  Patient Care Team: Morton Peters., MD as PCP - General (Unknown Physician Specialty) [name], MD as Consulting Physician (Oncology)  CHIEF COMPLAINT:  No chief complaint on file.   INTERVAL HISTORY: Patient returns to clinic today for hospital follow-up and further evaluation. He continues to feel significantly weak and fatigued. He also now has an oxygen requirement with shortness of breath and dyspnea exertion. He continues to have occasional nausea. He denies any fevers. He has no neurologic complaints. He denies any chest pain. He denies any weight loss. He has no urinary complaints. Patient otherwise feels well and offers no further specific complaints.   REVIEW OF SYSTEMS:   Review of Systems  Constitutional: Positive for malaise/fatigue. Negative for fever.  Respiratory: Positive for shortness of breath.   Cardiovascular: Negative.   Gastrointestinal: Positive for nausea. Negative for vomiting.  Musculoskeletal: Negative.   Neurological: Positive for weakness.    As per HPI. Otherwise, a complete review of systems is negatve.  PAST MEDICAL HISTORY: Past Medical History  Diagnosis Date  . Coronary artery disease     a. 2000 s/p CABG;  b. 10/2008 Neg MV, EF 64%.  [name] BPH (benign prostatic hyperplasia)   . Hyperlipidemia   . Leukemia   . H/O echocardiogram     a. 10/2005 Echo: nl EF.  [name] Atypical chest pain     a. 08/2014  . Asymptomatic Sinus Bradycardia   . Asthma   . Hypertension   . GERD (gastroesophageal reflux disease)   . HTN (hypertension)   . CKD (chronic kidney disease), stage III   . COPD (chronic obstructive pulmonary disease)   . Shortness of breath dyspnea   . Dysrhythmia     PAST SURGICAL HISTORY: Past Surgical History  Procedure Laterality Date  . Coronary artery bypass graft  2000   . Cataract extraction      FAMILY HISTORY Family History  Problem Relation Age of Onset  . Other      no premature CAD.       ADVANCED DIRECTIVES:    HEALTH MAINTENANCE: History  Substance Use Topics  . Smoking status: Never Smoker   . Smokeless tobacco: Never Used  . Alcohol Use: No     Colonoscopy:  PAP:  Bone density:  Lipid panel:  No Known Allergies  Current Outpatient Prescriptions  Medication Sig Dispense Refill  . aspirin 81 MG EC tablet Take 2 tablets (162 mg total) by mouth daily. 30 tablet 0  . atorvastatin (LIPITOR) 40 MG tablet Take 20 mg by mouth daily.     . dapsone 100 MG tablet TAKE 1 TABLET BY MOUTH EVERY DAY 30 tablet 3  . Multiple Vitamin (MULTIVITAMIN) tablet Take 1 tablet by mouth daily.      . OXYGEN Inhale 2 L/min into the lungs as needed (for breathing). Oxygen @@ 2 liters via nasal cannula as needed through the day.    . pantoprazole (PROTONIX) 40 MG tablet Take 40 mg by mouth daily.    . ranitidine (ZANTAC) 150 MG capsule Take 150 mg by mouth 2 (two) times daily as needed for heartburn.     . tamsulosin (FLOMAX) 0.4 MG CAPS capsule Take 0.4 mg by mouth daily.    [name] tiotropium (SPIRIVA) 18 MCG inhalation capsule Place 1 capsule (18 mcg total) into inhaler and inhale daily. 30 capsule 12  .  prochlorperazine (COMPAZINE) 10 MG tablet Take 1 tablet (10 mg total) by mouth every 6 (six) hours as needed for nausea or vomiting. 30 tablet 0  . sulfamethoxazole-trimethoprim (BACTRIM DS,SEPTRA DS) 800-160 MG per tablet Take 1 tablet by mouth 3 (three) times a week. 30 tablet 3  . valACYclovir (VALTREX) 1000 MG tablet Take 1 tablet (1,000 mg total) by mouth daily. 30 tablet 3   No current facility-administered medications for this visit.   Facility-Administered Medications Ordered in Other Visits  Medication Dose Route Frequency Provider Last Rate Last Dose  . sodium chloride 0.9 % injection 10 mL  10 mL Intracatheter PRN [name], MD      .  sodium chloride 0.9 % injection 10 mL  10 mL Intracatheter PRN [name], MD        OBJECTIVE: There were no vitals filed for this visit.   There is no weight on file to calculate BMI.    ECOG FS:2 - Symptomatic, <50% confined to bed  General: Well-developed, well-nourished, no acute distress. Eyes: anicteric sclera. Lungs: Clear to auscultation bilaterally. Heart: Regular rate and rhythm. No rubs, murmurs, or gallops. Abdomen: Soft, nontender, nondistended. No organomegaly noted, normoactive bowel sounds. Musculoskeletal: No edema, cyanosis, or clubbing. Neuro: Alert, answering all questions appropriately. Cranial nerves grossly intact. Skin: No rashes or petechiae noted. Psych: Normal affect.   LAB RESULTS:  Lab Results  Component Value Date   NA 136 02/27/2015   K 3.6 02/27/2015   CL 106 02/27/2015   CO2 24 02/27/2015   GLUCOSE 128* 02/27/2015   BUN 15 02/27/2015   CREATININE 1.51* 02/27/2015   CALCIUM 8.7* 02/27/2015   PROT 6.5 02/27/2015   ALBUMIN 4.3 02/27/2015   AST 25 02/27/2015   ALT 10* 02/27/2015   ALKPHOS 56 02/27/2015   BILITOT 1.2 02/27/2015   GFRNONAA 42* 02/27/2015   GFRAA 49* 02/27/2015    Lab Results  Component Value Date   WBC 1.6* 02/27/2015   NEUTROABS 1.2* 02/27/2015   HGB 8.4* 02/27/2015   HCT 25.6* 02/27/2015   MCV 116.7* 02/27/2015   PLT 114* 02/27/2015     STUDIES: Ct Biopsy  02/21/2015   CLINICAL DATA:  Hairy cell leukemia  EXAM: CT GUIDED BONE MARROW BIOPSY OF LEFT ILIAC BONE  ANESTHESIA/SEDATION: None  PROCEDURE: The procedure risks, benefits, and alternatives were explained to the patient. Questions regarding the procedure were encouraged and answered. The patient understands and consents to the procedure.  The left buttock was prepped with chlorhexidinein a sterile fashion, and a sterile drape was applied covering the operative field. A sterile gown and sterile gloves were used for the procedure. Local anesthesia was provided  with 0.25% Marcaine.  Utilizing 0.25% Marcaine as a deep periosteal anesthetic and CT fluoroscopic guidance a bone biopsy needle was placed into the left iliac bone adjacent to the sacroiliac joint. An initial bone marrow aspirate was performed and deemed adequate by the cytotechnologist. Subsequently a single core bone biopsy was performed without difficulty. The patient tolerated the procedure well was returned to his room in satisfactory condition.  COMPLICATIONS: None immediate  IMPRESSION: Successful CT-guided bone marrow biopsy of left iliac bone.   Electronically Signed   By: Inez Catalina M.D.   On: 02/21/2015 07:45   Dg Chest Port 1 View  01/28/2015   CLINICAL DATA:  Left chest pain.  EXAM: PORTABLE CHEST - 1 VIEW  COMPARISON:  01/13/2015  FINDINGS: Prior CABG. The lungs appear clear. Heart size within  normal limits. No pleural effusion.  IMPRESSION: 1. No significant abnormality to explain the patient's chest pain.   Electronically Signed   By: Van Clines M.D.   On: 01/28/2015 19:52    ASSESSMENT:  Hairy cell leukemia.  PLAN:   1. Hairy cell leukemia: Patient initially received cladribine in 2006 and then was treated for a relapse in 2011 and again in 2014. He continues to have pancytopenia which is relatively stable. Given patient's persistent symptoms, will repeat bone marrow biopsy to assess for recurrence of disease. Patient will then return to clinic one week after his biopsy to discuss the results and treatment planning if necessary.  2. Pancytopenia: Stable. No intervention needed. Bone marrow biopsy as above. 3. Shortness breath: Unclear etiology, patient has been instructed to continue to follow-up with pulmonary.   Patient expressed understanding and was in agreement with this plan. He also understands that He can call clinic at any time with any questions, concerns, or complaints.     [name], MD   02/27/2015 1:30 PM

## 2015-02-28 ENCOUNTER — Encounter: Payer: Medicare PPO | Admitting: Cardiovascular Disease

## 2015-02-28 ENCOUNTER — Inpatient Hospital Stay: Payer: Medicare PPO

## 2015-02-28 VITALS — BP 109/57 | HR 73 | Temp 97.6°F | Resp 18

## 2015-02-28 DIAGNOSIS — C9140 Hairy cell leukemia not having achieved remission: Secondary | ICD-10-CM

## 2015-02-28 DIAGNOSIS — Z5111 Encounter for antineoplastic chemotherapy: Secondary | ICD-10-CM | POA: Diagnosis not present

## 2015-02-28 MED ORDER — SODIUM CHLORIDE 0.9 % IV SOLN
Freq: Once | INTRAVENOUS | Status: AC
  Administered 2015-02-28: 14:00:00 via INTRAVENOUS
  Filled 2015-02-28: qty 1000

## 2015-02-28 MED ORDER — SODIUM CHLORIDE 0.9 % IV SOLN
0.1500 mg/kg | Freq: Once | INTRAVENOUS | Status: AC
  Administered 2015-02-28: 11 mg via INTRAVENOUS
  Filled 2015-02-28: qty 11

## 2015-02-28 MED ORDER — SODIUM CHLORIDE 0.9 % IV SOLN
Freq: Once | INTRAVENOUS | Status: AC
  Administered 2015-02-28: 14:00:00 via INTRAVENOUS
  Filled 2015-02-28: qty 8

## 2015-02-28 MED ORDER — PROCHLORPERAZINE MALEATE 10 MG PO TABS
10.0000 mg | ORAL_TABLET | Freq: Once | ORAL | Status: AC
  Administered 2015-02-28: 10 mg via ORAL
  Filled 2015-02-28: qty 1

## 2015-03-01 ENCOUNTER — Inpatient Hospital Stay: Payer: Medicare PPO

## 2015-03-01 VITALS — BP 105/62 | HR 80 | Temp 95.8°F | Resp 20

## 2015-03-01 DIAGNOSIS — C9140 Hairy cell leukemia not having achieved remission: Secondary | ICD-10-CM

## 2015-03-01 DIAGNOSIS — Z5111 Encounter for antineoplastic chemotherapy: Secondary | ICD-10-CM | POA: Diagnosis not present

## 2015-03-01 MED ORDER — PROCHLORPERAZINE MALEATE 10 MG PO TABS
10.0000 mg | ORAL_TABLET | Freq: Once | ORAL | Status: AC
  Administered 2015-03-01: 10 mg via ORAL
  Filled 2015-03-01: qty 1

## 2015-03-01 MED ORDER — DEXAMETHASONE SODIUM PHOSPHATE 100 MG/10ML IJ SOLN
Freq: Once | INTRAMUSCULAR | Status: AC
  Administered 2015-03-01: 14:00:00 via INTRAVENOUS
  Filled 2015-03-01: qty 8

## 2015-03-01 MED ORDER — SODIUM CHLORIDE 0.9 % IV SOLN
Freq: Once | INTRAVENOUS | Status: AC
  Administered 2015-03-01: 14:00:00 via INTRAVENOUS
  Filled 2015-03-01: qty 1000

## 2015-03-01 MED ORDER — SODIUM CHLORIDE 0.9 % IV SOLN
0.1500 mg/kg | Freq: Once | INTRAVENOUS | Status: AC
  Administered 2015-03-01: 11 mg via INTRAVENOUS
  Filled 2015-03-01: qty 11

## 2015-03-02 ENCOUNTER — Inpatient Hospital Stay: Payer: Medicare PPO

## 2015-03-02 VITALS — BP 121/65 | HR 70 | Temp 96.0°F | Resp 18

## 2015-03-02 DIAGNOSIS — C9140 Hairy cell leukemia not having achieved remission: Secondary | ICD-10-CM

## 2015-03-02 DIAGNOSIS — Z5111 Encounter for antineoplastic chemotherapy: Secondary | ICD-10-CM | POA: Diagnosis not present

## 2015-03-02 MED ORDER — CLADRIBINE CHEMO INJECTION 10 MG/10ML
0.1500 mg/kg | Freq: Once | INTRAVENOUS | Status: AC
  Administered 2015-03-02: 11 mg via INTRAVENOUS
  Filled 2015-03-02: qty 11

## 2015-03-02 MED ORDER — SODIUM CHLORIDE 0.9 % IV SOLN
Freq: Once | INTRAVENOUS | Status: AC
Start: 2015-03-02 — End: 2015-03-02
  Administered 2015-03-02: 15:00:00 via INTRAVENOUS
  Filled 2015-03-02: qty 1000

## 2015-03-02 MED ORDER — DEXAMETHASONE SODIUM PHOSPHATE 100 MG/10ML IJ SOLN
Freq: Once | INTRAMUSCULAR | Status: AC
  Administered 2015-03-02: 15:00:00 via INTRAVENOUS
  Filled 2015-03-02: qty 8

## 2015-03-02 MED ORDER — PROCHLORPERAZINE MALEATE 10 MG PO TABS
10.0000 mg | ORAL_TABLET | Freq: Once | ORAL | Status: AC
  Administered 2015-03-02: 10 mg via ORAL
  Filled 2015-03-02: qty 1

## 2015-03-03 ENCOUNTER — Inpatient Hospital Stay: Payer: Medicare PPO

## 2015-03-03 VITALS — BP 105/61 | HR 90 | Temp 97.4°F | Resp 18

## 2015-03-03 DIAGNOSIS — C9140 Hairy cell leukemia not having achieved remission: Secondary | ICD-10-CM

## 2015-03-03 DIAGNOSIS — Z5111 Encounter for antineoplastic chemotherapy: Secondary | ICD-10-CM | POA: Diagnosis not present

## 2015-03-03 MED ORDER — SODIUM CHLORIDE 0.9 % IV SOLN
0.1500 mg/kg | Freq: Once | INTRAVENOUS | Status: AC
  Administered 2015-03-03: 11 mg via INTRAVENOUS
  Filled 2015-03-03: qty 11

## 2015-03-03 MED ORDER — PROCHLORPERAZINE MALEATE 10 MG PO TABS
10.0000 mg | ORAL_TABLET | Freq: Once | ORAL | Status: AC
  Administered 2015-03-03: 10 mg via ORAL
  Filled 2015-03-03: qty 1

## 2015-03-03 MED ORDER — SODIUM CHLORIDE 0.9 % IV SOLN
Freq: Once | INTRAVENOUS | Status: AC
  Administered 2015-03-03: 14:00:00 via INTRAVENOUS
  Filled 2015-03-03: qty 1000

## 2015-03-03 MED ORDER — SODIUM CHLORIDE 0.9 % IV SOLN
Freq: Once | INTRAVENOUS | Status: AC
  Administered 2015-03-03: 14:00:00 via INTRAVENOUS
  Filled 2015-03-03: qty 8

## 2015-03-06 ENCOUNTER — Inpatient Hospital Stay (HOSPITAL_BASED_OUTPATIENT_CLINIC_OR_DEPARTMENT_OTHER): Payer: Medicare PPO | Admitting: Oncology

## 2015-03-06 ENCOUNTER — Other Ambulatory Visit: Payer: Self-pay | Admitting: *Deleted

## 2015-03-06 ENCOUNTER — Inpatient Hospital Stay: Payer: Medicare PPO

## 2015-03-06 ENCOUNTER — Inpatient Hospital Stay: Payer: Medicare PPO | Attending: Oncology

## 2015-03-06 VITALS — BP 110/78 | HR 78 | Temp 97.6°F | Resp 20

## 2015-03-06 VITALS — BP 106/76 | HR 74 | Temp 98.0°F | Resp 20 | Wt 159.2 lb

## 2015-03-06 DIAGNOSIS — N400 Enlarged prostate without lower urinary tract symptoms: Secondary | ICD-10-CM | POA: Diagnosis not present

## 2015-03-06 DIAGNOSIS — J449 Chronic obstructive pulmonary disease, unspecified: Secondary | ICD-10-CM

## 2015-03-06 DIAGNOSIS — K219 Gastro-esophageal reflux disease without esophagitis: Secondary | ICD-10-CM | POA: Diagnosis not present

## 2015-03-06 DIAGNOSIS — C9140 Hairy cell leukemia not having achieved remission: Secondary | ICD-10-CM | POA: Diagnosis not present

## 2015-03-06 DIAGNOSIS — R5383 Other fatigue: Secondary | ICD-10-CM | POA: Insufficient documentation

## 2015-03-06 DIAGNOSIS — Z5111 Encounter for antineoplastic chemotherapy: Secondary | ICD-10-CM | POA: Diagnosis present

## 2015-03-06 DIAGNOSIS — R531 Weakness: Secondary | ICD-10-CM | POA: Insufficient documentation

## 2015-03-06 DIAGNOSIS — I2510 Atherosclerotic heart disease of native coronary artery without angina pectoris: Secondary | ICD-10-CM | POA: Insufficient documentation

## 2015-03-06 DIAGNOSIS — Z7982 Long term (current) use of aspirin: Secondary | ICD-10-CM | POA: Diagnosis not present

## 2015-03-06 DIAGNOSIS — E785 Hyperlipidemia, unspecified: Secondary | ICD-10-CM | POA: Insufficient documentation

## 2015-03-06 DIAGNOSIS — I129 Hypertensive chronic kidney disease with stage 1 through stage 4 chronic kidney disease, or unspecified chronic kidney disease: Secondary | ICD-10-CM | POA: Insufficient documentation

## 2015-03-06 DIAGNOSIS — N183 Chronic kidney disease, stage 3 (moderate): Secondary | ICD-10-CM | POA: Insufficient documentation

## 2015-03-06 DIAGNOSIS — Z951 Presence of aortocoronary bypass graft: Secondary | ICD-10-CM | POA: Insufficient documentation

## 2015-03-06 DIAGNOSIS — R0609 Other forms of dyspnea: Secondary | ICD-10-CM | POA: Insufficient documentation

## 2015-03-06 DIAGNOSIS — Z79899 Other long term (current) drug therapy: Secondary | ICD-10-CM

## 2015-03-06 DIAGNOSIS — J45909 Unspecified asthma, uncomplicated: Secondary | ICD-10-CM

## 2015-03-06 DIAGNOSIS — D61818 Other pancytopenia: Secondary | ICD-10-CM | POA: Insufficient documentation

## 2015-03-06 DIAGNOSIS — R5381 Other malaise: Secondary | ICD-10-CM | POA: Insufficient documentation

## 2015-03-06 DIAGNOSIS — R110 Nausea: Secondary | ICD-10-CM | POA: Diagnosis not present

## 2015-03-06 LAB — CBC WITH DIFFERENTIAL/PLATELET
BASOS ABS: 0 10*3/uL (ref 0–0.1)
Basophils Relative: 0 %
Eosinophils Absolute: 0 10*3/uL (ref 0–0.7)
HCT: 24.8 % — ABNORMAL LOW (ref 40.0–52.0)
HEMOGLOBIN: 8.2 g/dL — AB (ref 13.0–18.0)
Lymphs Abs: 0 10*3/uL — ABNORMAL LOW (ref 1.0–3.6)
MCH: 39.5 pg — ABNORMAL HIGH (ref 26.0–34.0)
MCHC: 33.1 g/dL (ref 32.0–36.0)
MCV: 119.4 fL — AB (ref 80.0–100.0)
MONO ABS: 0 10*3/uL — AB (ref 0.2–1.0)
Monocytes Relative: 1 %
Neutro Abs: 0.5 10*3/uL — ABNORMAL LOW (ref 1.4–6.5)
Neutrophils Relative %: 89 %
PLATELETS: 129 10*3/uL — AB (ref 150–440)
RBC: 2.08 MIL/uL — ABNORMAL LOW (ref 4.40–5.90)
RDW: 18 % — AB (ref 11.5–14.5)
WBC: 0.6 10*3/uL — AB (ref 3.8–10.6)

## 2015-03-06 LAB — COMPREHENSIVE METABOLIC PANEL
ALK PHOS: 55 U/L (ref 38–126)
ALT: 10 U/L — ABNORMAL LOW (ref 17–63)
AST: 24 U/L (ref 15–41)
Albumin: 3.9 g/dL (ref 3.5–5.0)
Anion gap: 4 — ABNORMAL LOW (ref 5–15)
BUN: 21 mg/dL — ABNORMAL HIGH (ref 6–20)
CALCIUM: 8.1 mg/dL — AB (ref 8.9–10.3)
CHLORIDE: 105 mmol/L (ref 101–111)
CO2: 25 mmol/L (ref 22–32)
Creatinine, Ser: 1.31 mg/dL — ABNORMAL HIGH (ref 0.61–1.24)
GFR calc Af Amer: 58 mL/min — ABNORMAL LOW (ref >60)
GFR calc non Af Amer: 50 mL/min — ABNORMAL LOW (ref >60)
GLUCOSE: 118 mg/dL — AB (ref 65–99)
POTASSIUM: 3.4 mmol/L — AB (ref 3.5–5.1)
SODIUM: 134 mmol/L — AB (ref 135–145)
Total Bilirubin: 1.5 mg/dL — ABNORMAL HIGH (ref 0.3–1.2)
Total Protein: 6.1 g/dL — ABNORMAL LOW (ref 6.5–8.1)

## 2015-03-06 MED ORDER — SODIUM CHLORIDE 0.9 % IV SOLN: 375.0000 mg/m2 | Freq: Once | INTRAVENOUS | Status: DC

## 2015-03-06 MED ORDER — SODIUM CHLORIDE 0.9 % IV SOLN
375.0000 mg/m2 | Freq: Once | INTRAVENOUS | Status: AC
  Administered 2015-03-06: 700 mg via INTRAVENOUS
  Filled 2015-03-06: qty 60

## 2015-03-06 MED ORDER — DIPHENHYDRAMINE HCL 25 MG PO CAPS
50.0000 mg | ORAL_CAPSULE | Freq: Once | ORAL | Status: AC
  Administered 2015-03-06: 50 mg via ORAL
  Filled 2015-03-06: qty 2

## 2015-03-06 MED ORDER — ACETAMINOPHEN 325 MG PO TABS
650.0000 mg | ORAL_TABLET | Freq: Once | ORAL | Status: AC
  Administered 2015-03-06: 650 mg via ORAL
  Filled 2015-03-06: qty 2

## 2015-03-06 MED ORDER — SODIUM CHLORIDE 0.9 % IV SOLN
Freq: Once | INTRAVENOUS | Status: AC
  Administered 2015-03-06: 12:00:00 via INTRAVENOUS
  Filled 2015-03-06: qty 1000

## 2015-03-06 NOTE — Progress Notes (Signed)
Patient here today for ongoing follow up regarding hairy cell leukemia, patient reports bloating and constipation.

## 2015-03-09 ENCOUNTER — Other Ambulatory Visit: Payer: Self-pay | Admitting: *Deleted

## 2015-03-09 DIAGNOSIS — C9140 Hairy cell leukemia not having achieved remission: Secondary | ICD-10-CM

## 2015-03-10 NOTE — Progress Notes (Signed)
[facility]  [phone] [phone]  ID: Naif Alabi [place] OB: 03-11-1933  MR#: [id number]  [facility]#:[id number]  Patient Care Team: Morton Peters., MD as PCP - General (Unknown Physician Specialty) [name], MD as Consulting Physician (Oncology)  CHIEF COMPLAINT:  Chief Complaint  Patient presents with  . Follow-up    hairy cell leukemia  . Chemotherapy    INTERVAL HISTORY: Patient returns to clinic today for consideration of cycle 2 of 8 of weekly Rituxan. He tolerated days 1 through 5 of cladribine well. He continues to feel significantly weak and fatigued. He continues to have shortness of breath and dyspnea exertion. He continues to have occasional nausea. He denies any fevers. He has no neurologic complaints. He denies any chest pain. He denies any weight loss. He has no urinary complaints. Patient otherwise feels well and offers no further specific complaints.   REVIEW OF SYSTEMS:   Review of Systems  Constitutional: Positive for malaise/fatigue. Negative for fever.  Respiratory: Positive for shortness of breath.   Cardiovascular: Negative.   Gastrointestinal: Positive for nausea. Negative for vomiting.  Musculoskeletal: Negative.   Neurological: Positive for weakness.    As per HPI. Otherwise, a complete review of systems is negatve.  PAST MEDICAL HISTORY: Past Medical History  Diagnosis Date  . Coronary artery disease     a. 2000 s/p CABG;  b. 10/2008 Neg MV, EF 64%.  [name] BPH (benign prostatic hyperplasia)   . Hyperlipidemia   . Leukemia   . H/O echocardiogram     a. 10/2005 Echo: nl EF.  [name] Atypical chest pain     a. 08/2014  . Asymptomatic Sinus Bradycardia   . Asthma   . Hypertension   . GERD (gastroesophageal reflux disease)   . HTN (hypertension)   . CKD (chronic kidney disease), stage III   . COPD (chronic obstructive pulmonary disease)   . Shortness of breath dyspnea   . Dysrhythmia     PAST  SURGICAL HISTORY: Past Surgical History  Procedure Laterality Date  . Coronary artery bypass graft  2000  . Cataract extraction      FAMILY HISTORY Family History  Problem Relation Age of Onset  . Other      no premature CAD.       ADVANCED DIRECTIVES:    HEALTH MAINTENANCE: History  Substance Use Topics  . Smoking status: Never Smoker   . Smokeless tobacco: Never Used  . Alcohol Use: No     Colonoscopy:  PAP:  Bone density:  Lipid panel:  No Known Allergies  Current Outpatient Prescriptions  Medication Sig Dispense Refill  . aspirin 81 MG EC tablet Take 2 tablets (162 mg total) by mouth daily. 30 tablet 0  . atorvastatin (LIPITOR) 40 MG tablet Take 20 mg by mouth daily.     . dapsone 100 MG tablet TAKE 1 TABLET BY MOUTH EVERY DAY 30 tablet 3  . Multiple Vitamin (MULTIVITAMIN) tablet Take 1 tablet by mouth daily.      . pantoprazole (PROTONIX) 40 MG tablet Take 40 mg by mouth daily.    . prochlorperazine (COMPAZINE) 10 MG tablet Take 1 tablet (10 mg total) by mouth every 6 (six) hours as needed for nausea or vomiting. 30 tablet 0  . ranitidine (ZANTAC) 150 MG capsule Take 150 mg by mouth 2 (two) times daily as needed for heartburn.     . sulfamethoxazole-trimethoprim (BACTRIM DS,SEPTRA DS) 800-160 MG per tablet Take 1 tablet by mouth  3 (three) times a week. 30 tablet 3  . tamsulosin (FLOMAX) 0.4 MG CAPS capsule Take 0.4 mg by mouth daily.    [name] tiotropium (SPIRIVA) 18 MCG inhalation capsule Place 1 capsule (18 mcg total) into inhaler and inhale daily. 30 capsule 12  . valACYclovir (VALTREX) 1000 MG tablet Take 1 tablet (1,000 mg total) by mouth daily. 30 tablet 3  . OXYGEN Inhale 2 L/min into the lungs as needed (for breathing). Oxygen @@ 2 liters via nasal cannula as needed through the day.     No current facility-administered medications for this visit.   Facility-Administered Medications Ordered in Other Visits  Medication Dose Route Frequency Provider Last Rate  Last Dose  . sodium chloride 0.9 % injection 10 mL  10 mL Intracatheter PRN [name], MD      . sodium chloride 0.9 % injection 10 mL  10 mL Intracatheter PRN [name], MD        OBJECTIVE: Filed Vitals:   03/06/15 1024  BP: 106/76  Pulse: 74  Temp: 98 F (36.7 C)  Resp: 20     Body mass index is 24.21 kg/(m^2).    ECOG FS:2 - Symptomatic, <50% confined to bed  General: Well-developed, well-nourished, no acute distress. Eyes: anicteric sclera. Lungs: Clear to auscultation bilaterally. Heart: Regular rate and rhythm. No rubs, murmurs, or gallops. Abdomen: Soft, nontender, nondistended. No organomegaly noted, normoactive bowel sounds. Musculoskeletal: No edema, cyanosis, or clubbing. Neuro: Alert, answering all questions appropriately. Cranial nerves grossly intact. Skin: No rashes or petechiae noted. Psych: Normal affect.   LAB RESULTS:  Lab Results  Component Value Date   NA 134* 03/06/2015   K 3.4* 03/06/2015   CL 105 03/06/2015   CO2 25 03/06/2015   GLUCOSE 118* 03/06/2015   BUN 21* 03/06/2015   CREATININE 1.31* 03/06/2015   CALCIUM 8.1* 03/06/2015   PROT 6.1* 03/06/2015   ALBUMIN 3.9 03/06/2015   AST 24 03/06/2015   ALT 10* 03/06/2015   ALKPHOS 55 03/06/2015   BILITOT 1.5* 03/06/2015   GFRNONAA 50* 03/06/2015   GFRAA 58* 03/06/2015    Lab Results  Component Value Date   WBC 0.6* 03/06/2015   NEUTROABS 0.5* 03/06/2015   HGB 8.2* 03/06/2015   HCT 24.8* 03/06/2015   MCV 119.4* 03/06/2015   PLT 129* 03/06/2015     STUDIES: Ct Biopsy  02/21/2015   CLINICAL DATA:  Hairy cell leukemia  EXAM: CT GUIDED BONE MARROW BIOPSY OF LEFT ILIAC BONE  ANESTHESIA/SEDATION: None  PROCEDURE: The procedure risks, benefits, and alternatives were explained to the patient. Questions regarding the procedure were encouraged and answered. The patient understands and consents to the procedure.  The left buttock was prepped with chlorhexidinein a sterile fashion,  and a sterile drape was applied covering the operative field. A sterile gown and sterile gloves were used for the procedure. Local anesthesia was provided with 0.25% Marcaine.  Utilizing 0.25% Marcaine as a deep periosteal anesthetic and CT fluoroscopic guidance a bone biopsy needle was placed into the left iliac bone adjacent to the sacroiliac joint. An initial bone marrow aspirate was performed and deemed adequate by the cytotechnologist. Subsequently a single core bone biopsy was performed without difficulty. The patient tolerated the procedure well was returned to his room in satisfactory condition.  COMPLICATIONS: None immediate  IMPRESSION: Successful CT-guided bone marrow biopsy of left iliac bone.   Electronically Signed   By: Inez Catalina M.D.   On: 02/21/2015 07:45  ASSESSMENT:  Hairy cell leukemia.  PLAN:   1. Hairy cell leukemia: Patient initially received cladribine in 2006 and then was treated for a relapse in 2011 and again in 2014. He continues to have pancytopenia which is relatively stable. Bone marrow biopsy confirmed recurrence of disease with 80-90% hairy cell leukemia noted in his specimen. Patient completed cladribine 0.15 mg/kg on days 1 through 5 last week. Proceed with cycle 2 of 8 of weekly Rituxan 375 mg/m2. Return to clinic in 1 week for consideration of cycle 3 of weekly Rituxan. Will likely repeat bone marrow biopsy at the conclusion of his 8 weeks of Rituxan.  Patient was given prescriptions for Valtrex and Bactrim for prophylaxis. 2. Pancytopenia: Decreased, secondary to chemotherapy as well as Hairy cell.  3. Shortness breath: Unclear etiology, patient has been instructed to continue to follow-up with pulmonary. Patient had an arterial blood gas drawn on February 22, 2015 without supplemental oxygen that reported an oxygen saturation of 94% and a PO2 of 82.  Patient expressed understanding and was in agreement with this plan. He also understands that He can call clinic at  any time with any questions, concerns, or complaints.     [name], MD   03/10/2015 10:03 AM

## 2015-03-13 ENCOUNTER — Inpatient Hospital Stay (HOSPITAL_BASED_OUTPATIENT_CLINIC_OR_DEPARTMENT_OTHER): Payer: Medicare PPO | Admitting: Oncology

## 2015-03-13 ENCOUNTER — Inpatient Hospital Stay: Payer: Medicare PPO

## 2015-03-13 VITALS — BP 133/62 | HR 72 | Temp 96.0°F | Resp 16 | Wt 156.3 lb

## 2015-03-13 DIAGNOSIS — K219 Gastro-esophageal reflux disease without esophagitis: Secondary | ICD-10-CM

## 2015-03-13 DIAGNOSIS — D61818 Other pancytopenia: Secondary | ICD-10-CM

## 2015-03-13 DIAGNOSIS — R110 Nausea: Secondary | ICD-10-CM

## 2015-03-13 DIAGNOSIS — C9140 Hairy cell leukemia not having achieved remission: Secondary | ICD-10-CM

## 2015-03-13 DIAGNOSIS — R531 Weakness: Secondary | ICD-10-CM

## 2015-03-13 DIAGNOSIS — E785 Hyperlipidemia, unspecified: Secondary | ICD-10-CM

## 2015-03-13 DIAGNOSIS — Z5111 Encounter for antineoplastic chemotherapy: Secondary | ICD-10-CM | POA: Diagnosis not present

## 2015-03-13 DIAGNOSIS — R5383 Other fatigue: Secondary | ICD-10-CM

## 2015-03-13 DIAGNOSIS — N183 Chronic kidney disease, stage 3 (moderate): Secondary | ICD-10-CM

## 2015-03-13 DIAGNOSIS — J449 Chronic obstructive pulmonary disease, unspecified: Secondary | ICD-10-CM

## 2015-03-13 DIAGNOSIS — I129 Hypertensive chronic kidney disease with stage 1 through stage 4 chronic kidney disease, or unspecified chronic kidney disease: Secondary | ICD-10-CM

## 2015-03-13 DIAGNOSIS — R5381 Other malaise: Secondary | ICD-10-CM

## 2015-03-13 DIAGNOSIS — I2510 Atherosclerotic heart disease of native coronary artery without angina pectoris: Secondary | ICD-10-CM

## 2015-03-13 LAB — COMPREHENSIVE METABOLIC PANEL
ALK PHOS: 60 U/L (ref 38–126)
ALT: 12 U/L — AB (ref 17–63)
AST: 24 U/L (ref 15–41)
Albumin: 3.9 g/dL (ref 3.5–5.0)
Anion gap: 3 — ABNORMAL LOW (ref 5–15)
BUN: 21 mg/dL — AB (ref 6–20)
CHLORIDE: 108 mmol/L (ref 101–111)
CO2: 24 mmol/L (ref 22–32)
Calcium: 8.2 mg/dL — ABNORMAL LOW (ref 8.9–10.3)
Creatinine, Ser: 1.32 mg/dL — ABNORMAL HIGH (ref 0.61–1.24)
GFR calc Af Amer: 57 mL/min — ABNORMAL LOW (ref >60)
GFR calc non Af Amer: 49 mL/min — ABNORMAL LOW (ref >60)
GLUCOSE: 108 mg/dL — AB (ref 65–99)
POTASSIUM: 3.7 mmol/L (ref 3.5–5.1)
Sodium: 135 mmol/L (ref 135–145)
TOTAL PROTEIN: 6.3 g/dL — AB (ref 6.5–8.1)
Total Bilirubin: 1 mg/dL (ref 0.3–1.2)

## 2015-03-13 LAB — CBC WITH DIFFERENTIAL/PLATELET
Basophils Absolute: 0 10*3/uL (ref 0–0.1)
Eosinophils Absolute: 0 10*3/uL (ref 0–0.7)
Eosinophils Relative: 3 %
HCT: 23.5 % — ABNORMAL LOW (ref 40.0–52.0)
Hemoglobin: 7.9 g/dL — ABNORMAL LOW (ref 13.0–18.0)
Lymphocytes Relative: 14 %
Lymphs Abs: 0.1 10*3/uL — ABNORMAL LOW (ref 1.0–3.6)
MCH: 41 pg — ABNORMAL HIGH (ref 26.0–34.0)
MCHC: 33.8 g/dL (ref 32.0–36.0)
MCV: 121.4 fL — ABNORMAL HIGH (ref 80.0–100.0)
Monocytes Absolute: 0 10*3/uL — ABNORMAL LOW (ref 0.2–1.0)
Monocytes Relative: 5 %
NEUTROS ABS: 0.7 10*3/uL — AB (ref 1.4–6.5)
Neutrophils Relative %: 78 %
PLATELETS: 86 10*3/uL — AB (ref 150–440)
RBC: 1.93 MIL/uL — AB (ref 4.40–5.90)
RDW: 21 % — AB (ref 11.5–14.5)
WBC: 0.9 10*3/uL — AB (ref 3.8–10.6)

## 2015-03-13 MED ORDER — RITUXIMAB CHEMO INJECTION 500 MG/50ML: 375.0000 mg/m2 | Freq: Once | INTRAVENOUS | Status: DC

## 2015-03-13 MED ORDER — ACETAMINOPHEN 325 MG PO TABS
650.0000 mg | ORAL_TABLET | Freq: Once | ORAL | Status: AC
Start: 2015-03-13 — End: 2015-03-13
  Administered 2015-03-13: 650 mg via ORAL
  Filled 2015-03-13: qty 2

## 2015-03-13 MED ORDER — SODIUM CHLORIDE 0.9 % IV SOLN
375.0000 mg/m2 | Freq: Once | INTRAVENOUS | Status: AC
  Administered 2015-03-13: 700 mg via INTRAVENOUS
  Filled 2015-03-13: qty 60

## 2015-03-13 MED ORDER — SODIUM CHLORIDE 0.9 % IV SOLN
Freq: Once | INTRAVENOUS | Status: AC
  Administered 2015-03-13: 10:00:00 via INTRAVENOUS
  Filled 2015-03-13: qty 1000

## 2015-03-13 MED ORDER — DIPHENHYDRAMINE HCL 25 MG PO CAPS
50.0000 mg | ORAL_CAPSULE | Freq: Once | ORAL | Status: AC
Start: 2015-03-13 — End: 2015-03-13
  Administered 2015-03-13: 50 mg via ORAL
  Filled 2015-03-13: qty 2

## 2015-03-13 MED ORDER — SODIUM CHLORIDE 0.9 % IJ SOLN
10.0000 mL | INTRAMUSCULAR | Status: DC | PRN
  Filled 2015-03-13: qty 10

## 2015-03-13 NOTE — Progress Notes (Signed)
[name]  Telephone:([phone] [phone]  ID: Frank Mcdaniel OB: Apr 23, 1933  MR#: [id number]  OJJ#:[id number]  Patient Care Team: Morton Peters., MD as PCP - General (Unknown Physician Specialty) [name], MD as Consulting Physician (Oncology)  CHIEF COMPLAINT:  Chief Complaint  Patient presents with  . Follow-up    Hairy cell leukemia    INTERVAL HISTORY: Patient returns to clinic today for consideration of cycle 3 of 8 of weekly Rituxan. His weakness and fatigue are improving.  He only complains of mild dyspnea on exertion. He continues to have occasional nausea. He denies any fevers. He has no neurologic complaints. He denies any chest pain. He denies any weight loss. He has no urinary complaints. Patient otherwise feels well and offers no further specific complaints.   REVIEW OF SYSTEMS:   Review of Systems  Constitutional: Positive for malaise/fatigue. Negative for fever.  Respiratory: Negative.   Cardiovascular: Negative.   Gastrointestinal: Positive for nausea. Negative for vomiting.  Musculoskeletal: Negative.   Neurological: Positive for weakness.    As per HPI. Otherwise, a complete review of systems is negatve.  PAST MEDICAL HISTORY: Past Medical History  Diagnosis Date  . Coronary artery disease     a. 2000 s/p CABG;  b. 10/2008 Neg MV, EF 64%.  [name] BPH (benign prostatic hyperplasia)   . Hyperlipidemia   . Leukemia   . H/O echocardiogram     a. 10/2005 Echo: nl EF.  [name] Atypical chest pain     a. 08/2014  . Asymptomatic Sinus Bradycardia   . Asthma   . Hypertension   . GERD (gastroesophageal reflux disease)   . HTN (hypertension)   . CKD (chronic kidney disease), stage III   . COPD (chronic obstructive pulmonary disease)   . Shortness of breath dyspnea   . Dysrhythmia     PAST SURGICAL HISTORY: Past Surgical History  Procedure Laterality Date  . Coronary artery bypass graft  2000  . Cataract  extraction      FAMILY HISTORY Family History  Problem Relation Age of Onset  . Other      no premature CAD.       ADVANCED DIRECTIVES:    HEALTH MAINTENANCE: History  Substance Use Topics  . Smoking status: Never Smoker   . Smokeless tobacco: Never Used  . Alcohol Use: No     Colonoscopy:  PAP:  Bone density:  Lipid panel:  No Known Allergies  Current Outpatient Prescriptions  Medication Sig Dispense Refill  . aspirin 81 MG EC tablet Take 2 tablets (162 mg total) by mouth daily. 30 tablet 0  . atorvastatin (LIPITOR) 40 MG tablet Take 20 mg by mouth daily.     . dapsone 100 MG tablet TAKE 1 TABLET BY MOUTH EVERY DAY 30 tablet 3  . Multiple Vitamin (MULTIVITAMIN) tablet Take 1 tablet by mouth daily.      . OXYGEN Inhale 2 L/min into the lungs as needed (for breathing). Oxygen @@ 2 liters via nasal cannula as needed through the day.    . pantoprazole (PROTONIX) 40 MG tablet Take 40 mg by mouth daily.    . prochlorperazine (COMPAZINE) 10 MG tablet Take 1 tablet (10 mg total) by mouth every 6 (six) hours as needed for nausea or vomiting. 30 tablet 0  . ranitidine (ZANTAC) 150 MG capsule Take 150 mg by mouth 2 (two) times daily as needed for heartburn.     . sulfamethoxazole-trimethoprim (BACTRIM DS,SEPTRA DS) 800-160  MG per tablet Take 1 tablet by mouth 3 (three) times a week. 30 tablet 3  . tamsulosin (FLOMAX) 0.4 MG CAPS capsule Take 0.4 mg by mouth daily.    [name] tiotropium (SPIRIVA) 18 MCG inhalation capsule Place 1 capsule (18 mcg total) into inhaler and inhale daily. 30 capsule 12  . valACYclovir (VALTREX) 1000 MG tablet Take 1 tablet (1,000 mg total) by mouth daily. 30 tablet 3   No current facility-administered medications for this visit.   Facility-Administered Medications Ordered in Other Visits  Medication Dose Route Frequency Provider Last Rate Last Dose  . sodium chloride 0.9 % injection 10 mL  10 mL Intracatheter PRN [name], MD      . sodium  chloride 0.9 % injection 10 mL  10 mL Intracatheter PRN [name], MD        OBJECTIVE: Filed Vitals:   03/13/15 0926  BP: 133/62  Pulse: 72  Temp: 96 F (35.6 C)  Resp: 16     Body mass index is 23.77 kg/(m^2).    ECOG FS:1 - Symptomatic but completely ambulatory  General: Well-developed, well-nourished, no acute distress. Eyes: anicteric sclera. Lungs: Clear to auscultation bilaterally. Heart: Regular rate and rhythm. No rubs, murmurs, or gallops. Abdomen: Soft, nontender, nondistended. No organomegaly noted, normoactive bowel sounds. Musculoskeletal: No edema, cyanosis, or clubbing. Neuro: Alert, answering all questions appropriately. Cranial nerves grossly intact. Skin: No rashes or petechiae noted. Psych: Normal affect.   LAB RESULTS:  Lab Results  Component Value Date   NA 135 03/13/2015   K 3.7 03/13/2015   CL 108 03/13/2015   CO2 24 03/13/2015   GLUCOSE 108* 03/13/2015   BUN 21* 03/13/2015   CREATININE 1.32* 03/13/2015   CALCIUM 8.2* 03/13/2015   PROT 6.3* 03/13/2015   ALBUMIN 3.9 03/13/2015   AST 24 03/13/2015   ALT 12* 03/13/2015   ALKPHOS 60 03/13/2015   BILITOT 1.0 03/13/2015   GFRNONAA 49* 03/13/2015   GFRAA 57* 03/13/2015    Lab Results  Component Value Date   WBC 0.9* 03/13/2015   NEUTROABS 0.7* 03/13/2015   HGB 7.9* 03/13/2015   HCT 23.5* 03/13/2015   MCV 121.4* 03/13/2015   PLT 86* 03/13/2015     STUDIES: Ct Biopsy  02/21/2015   CLINICAL DATA:  Hairy cell leukemia  EXAM: CT GUIDED BONE MARROW BIOPSY OF LEFT ILIAC BONE  ANESTHESIA/SEDATION: None  PROCEDURE: The procedure risks, benefits, and alternatives were explained to the patient. Questions regarding the procedure were encouraged and answered. The patient understands and consents to the procedure.  The left buttock was prepped with chlorhexidinein a sterile fashion, and a sterile drape was applied covering the operative field. A sterile gown and sterile gloves were used for the  procedure. Local anesthesia was provided with 0.25% Marcaine.  Utilizing 0.25% Marcaine as a deep periosteal anesthetic and CT fluoroscopic guidance a bone biopsy needle was placed into the left iliac bone adjacent to the sacroiliac joint. An initial bone marrow aspirate was performed and deemed adequate by the cytotechnologist. Subsequently a single core bone biopsy was performed without difficulty. The patient tolerated the procedure well was returned to his room in satisfactory condition.  COMPLICATIONS: None immediate  IMPRESSION: Successful CT-guided bone marrow biopsy of left iliac bone.   Electronically Signed   By: Inez Catalina M.D.   On: 02/21/2015 07:45    ASSESSMENT:  Hairy cell leukemia.  PLAN:   1. Hairy cell leukemia: Patient initially received cladribine in 2006 and  then was treated for a relapse in 2011 and again in 2014. He continues to have pancytopenia which is relatively stable. Bone marrow biopsy confirmed recurrence of disease with 80-90% hairy cell leukemia noted in his specimen. Patient completed cladribine 0.15 mg/kg on days 1 through 5 last week. Proceed with cycle 3 of 8 of weekly Rituxan 375 mg/m2. Return to clinic in 1 week for Rituxan only and 2 weeks for further evaluation and consideration of cycle 5. Will likely repeat bone marrow biopsy at the conclusion of his 8 weeks of Rituxan.  Patient was given prescriptions for Valtrex and Bactrim for prophylaxis. 2. Pancytopenia: Decreased, secondary to chemotherapy as well as Hairy cell.  3. Shortness breath: Likely multifactorial. Patient had an arterial blood gas drawn on February 22, 2015 without supplemental oxygen that reported an oxygen saturation of 94% and a PO2 of 82.  Patient expressed understanding and was in agreement with this plan. He also understands that He can call clinic at any time with any questions, concerns, or complaints.     [name], MD   03/13/2015 9:41 AM

## 2015-03-13 NOTE — Progress Notes (Signed)
Patient does have some nausea that is relieved with medication and was able to eat a good meal last night.

## 2015-03-20 ENCOUNTER — Inpatient Hospital Stay: Payer: Medicare PPO

## 2015-03-20 VITALS — BP 109/62 | HR 66 | Temp 96.9°F | Resp 18

## 2015-03-20 DIAGNOSIS — Z5111 Encounter for antineoplastic chemotherapy: Secondary | ICD-10-CM | POA: Diagnosis not present

## 2015-03-20 DIAGNOSIS — C9140 Hairy cell leukemia not having achieved remission: Secondary | ICD-10-CM

## 2015-03-20 LAB — COMPREHENSIVE METABOLIC PANEL
ALBUMIN: 3.9 g/dL (ref 3.5–5.0)
ALT: 10 U/L — ABNORMAL LOW (ref 17–63)
AST: 26 U/L (ref 15–41)
Alkaline Phosphatase: 59 U/L (ref 38–126)
Anion gap: 7 (ref 5–15)
BILIRUBIN TOTAL: 0.9 mg/dL (ref 0.3–1.2)
BUN: 16 mg/dL (ref 6–20)
CO2: 23 mmol/L (ref 22–32)
Calcium: 8.4 mg/dL — ABNORMAL LOW (ref 8.9–10.3)
Chloride: 110 mmol/L (ref 101–111)
Creatinine, Ser: 1.17 mg/dL (ref 0.61–1.24)
GFR calc Af Amer: >60 mL/min (ref >60)
GFR calc non Af Amer: 57 mL/min — ABNORMAL LOW (ref >60)
GLUCOSE: 110 mg/dL — AB (ref 65–99)
Potassium: 3.3 mmol/L — ABNORMAL LOW (ref 3.5–5.1)
Sodium: 140 mmol/L (ref 135–145)
Total Protein: 6.3 g/dL — ABNORMAL LOW (ref 6.5–8.1)

## 2015-03-20 LAB — CBC WITH DIFFERENTIAL/PLATELET
Basophils Absolute: 0 10*3/uL (ref 0–0.1)
EOS ABS: 0.1 10*3/uL (ref 0–0.7)
Eosinophils Relative: 6 %
HEMATOCRIT: 24.9 % — AB (ref 40.0–52.0)
Hemoglobin: 8.2 g/dL — ABNORMAL LOW (ref 13.0–18.0)
Lymphocytes Relative: 10 %
Lymphs Abs: 0.1 10*3/uL — ABNORMAL LOW (ref 1.0–3.6)
MCH: 41.5 pg — ABNORMAL HIGH (ref 26.0–34.0)
MCHC: 33.1 g/dL (ref 32.0–36.0)
MCV: 125.3 fL — ABNORMAL HIGH (ref 80.0–100.0)
MONO ABS: 0.2 10*3/uL (ref 0.2–1.0)
Neutro Abs: 0.8 10*3/uL — ABNORMAL LOW (ref 1.4–6.5)
Neutrophils Relative %: 66 %
Platelets: 122 10*3/uL — ABNORMAL LOW (ref 150–440)
RBC: 1.98 MIL/uL — ABNORMAL LOW (ref 4.40–5.90)
RDW: 20.3 % — AB (ref 11.5–14.5)
WBC: 1.2 10*3/uL — CL (ref 3.8–10.6)

## 2015-03-20 MED ORDER — DIPHENHYDRAMINE HCL 25 MG PO CAPS
50.0000 mg | ORAL_CAPSULE | Freq: Once | ORAL | Status: AC
  Administered 2015-03-20: 50 mg via ORAL
  Filled 2015-03-20: qty 2

## 2015-03-20 MED ORDER — SODIUM CHLORIDE 0.9 % IV SOLN
375.0000 mg/m2 | Freq: Once | INTRAVENOUS | Status: AC
  Administered 2015-03-20: 700 mg via INTRAVENOUS
  Filled 2015-03-20: qty 60

## 2015-03-20 MED ORDER — ACETAMINOPHEN 325 MG PO TABS
650.0000 mg | ORAL_TABLET | Freq: Once | ORAL | Status: AC
  Administered 2015-03-20: 650 mg via ORAL
  Filled 2015-03-20: qty 2

## 2015-03-20 MED ORDER — SODIUM CHLORIDE 0.9 % IV SOLN: 375.0000 mg/m2 | Freq: Once | INTRAVENOUS | Status: DC

## 2015-03-20 MED ORDER — SODIUM CHLORIDE 0.9 % IV SOLN
Freq: Once | INTRAVENOUS | Status: AC
  Administered 2015-03-20: 09:00:00 via INTRAVENOUS
  Filled 2015-03-20: qty 1000

## 2015-03-20 NOTE — Progress Notes (Signed)
ANC: 800. Dr. Ma Hillock, notified and aware. Proceed with chemotherapy treatment today per MD, Dr. Ma Hillock, order.

## 2015-03-27 ENCOUNTER — Inpatient Hospital Stay (HOSPITAL_BASED_OUTPATIENT_CLINIC_OR_DEPARTMENT_OTHER): Payer: Medicare PPO | Admitting: Oncology

## 2015-03-27 ENCOUNTER — Inpatient Hospital Stay: Payer: Medicare PPO

## 2015-03-27 VITALS — BP 115/56 | HR 60 | Temp 95.8°F

## 2015-03-27 VITALS — BP 128/58 | HR 60 | Temp 97.0°F | Resp 16 | Wt 163.4 lb

## 2015-03-27 DIAGNOSIS — R0609 Other forms of dyspnea: Secondary | ICD-10-CM | POA: Diagnosis not present

## 2015-03-27 DIAGNOSIS — C9140 Hairy cell leukemia not having achieved remission: Secondary | ICD-10-CM | POA: Diagnosis not present

## 2015-03-27 DIAGNOSIS — D61818 Other pancytopenia: Secondary | ICD-10-CM | POA: Diagnosis not present

## 2015-03-27 DIAGNOSIS — R531 Weakness: Secondary | ICD-10-CM | POA: Diagnosis not present

## 2015-03-27 DIAGNOSIS — I129 Hypertensive chronic kidney disease with stage 1 through stage 4 chronic kidney disease, or unspecified chronic kidney disease: Secondary | ICD-10-CM

## 2015-03-27 DIAGNOSIS — J449 Chronic obstructive pulmonary disease, unspecified: Secondary | ICD-10-CM

## 2015-03-27 DIAGNOSIS — K219 Gastro-esophageal reflux disease without esophagitis: Secondary | ICD-10-CM

## 2015-03-27 DIAGNOSIS — N183 Chronic kidney disease, stage 3 (moderate): Secondary | ICD-10-CM

## 2015-03-27 DIAGNOSIS — Z5111 Encounter for antineoplastic chemotherapy: Secondary | ICD-10-CM | POA: Diagnosis not present

## 2015-03-27 DIAGNOSIS — E785 Hyperlipidemia, unspecified: Secondary | ICD-10-CM

## 2015-03-27 DIAGNOSIS — I2510 Atherosclerotic heart disease of native coronary artery without angina pectoris: Secondary | ICD-10-CM

## 2015-03-27 DIAGNOSIS — R5383 Other fatigue: Secondary | ICD-10-CM

## 2015-03-27 DIAGNOSIS — R5381 Other malaise: Secondary | ICD-10-CM

## 2015-03-27 LAB — CBC WITH DIFFERENTIAL/PLATELET
BASOS ABS: 0 10*3/uL (ref 0–0.1)
BASOS PCT: 1 %
EOS ABS: 0.1 10*3/uL (ref 0–0.7)
Eosinophils Relative: 4 %
HCT: 26.4 % — ABNORMAL LOW (ref 40.0–52.0)
HEMOGLOBIN: 8.8 g/dL — AB (ref 13.0–18.0)
Lymphocytes Relative: 7 %
Lymphs Abs: 0.1 10*3/uL — ABNORMAL LOW (ref 1.0–3.6)
MCH: 41.4 pg — ABNORMAL HIGH (ref 26.0–34.0)
MCHC: 33.5 g/dL (ref 32.0–36.0)
MCV: 123.5 fL — ABNORMAL HIGH (ref 80.0–100.0)
MONOS PCT: 29 %
Monocytes Absolute: 0.5 10*3/uL (ref 0.2–1.0)
NEUTROS ABS: 1.1 10*3/uL — AB (ref 1.4–6.5)
NEUTROS PCT: 59 %
Platelets: 148 10*3/uL — ABNORMAL LOW (ref 150–440)
RBC: 2.13 MIL/uL — AB (ref 4.40–5.90)
RDW: 17.6 % — ABNORMAL HIGH (ref 11.5–14.5)
WBC: 1.8 10*3/uL — AB (ref 3.8–10.6)

## 2015-03-27 LAB — COMPREHENSIVE METABOLIC PANEL
ALK PHOS: 54 U/L (ref 38–126)
ALT: 12 U/L — AB (ref 17–63)
ANION GAP: 6 (ref 5–15)
AST: 34 U/L (ref 15–41)
Albumin: 4 g/dL (ref 3.5–5.0)
BUN: 19 mg/dL (ref 6–20)
CALCIUM: 8.4 mg/dL — AB (ref 8.9–10.3)
CO2: 23 mmol/L (ref 22–32)
CREATININE: 1.05 mg/dL (ref 0.61–1.24)
Chloride: 110 mmol/L (ref 101–111)
Glucose, Bld: 109 mg/dL — ABNORMAL HIGH (ref 65–99)
Potassium: 3.7 mmol/L (ref 3.5–5.1)
Sodium: 139 mmol/L (ref 135–145)
TOTAL PROTEIN: 6.3 g/dL — AB (ref 6.5–8.1)
Total Bilirubin: 0.7 mg/dL (ref 0.3–1.2)

## 2015-03-27 MED ORDER — SODIUM CHLORIDE 0.9 % IV SOLN
375.0000 mg/m2 | Freq: Once | INTRAVENOUS | Status: AC
  Administered 2015-03-27: 700 mg via INTRAVENOUS
  Filled 2015-03-27: qty 60

## 2015-03-27 MED ORDER — SODIUM CHLORIDE 0.9 % IV SOLN: 375.0000 mg/m2 | Freq: Once | INTRAVENOUS | Status: DC

## 2015-03-27 MED ORDER — SODIUM CHLORIDE 0.9 % IV SOLN
Freq: Once | INTRAVENOUS | Status: AC
  Administered 2015-03-27: 11:00:00 via INTRAVENOUS
  Filled 2015-03-27: qty 1000

## 2015-03-27 MED ORDER — SODIUM CHLORIDE 0.9 % IJ SOLN
10.0000 mL | INTRAMUSCULAR | Status: DC | PRN
  Filled 2015-03-27: qty 10

## 2015-03-27 MED ORDER — ACETAMINOPHEN 325 MG PO TABS
650.0000 mg | ORAL_TABLET | Freq: Once | ORAL | Status: AC
  Administered 2015-03-27: 650 mg via ORAL
  Filled 2015-03-27: qty 2

## 2015-03-27 MED ORDER — DIPHENHYDRAMINE HCL 25 MG PO CAPS
50.0000 mg | ORAL_CAPSULE | Freq: Once | ORAL | Status: AC
  Administered 2015-03-27: 50 mg via ORAL
  Filled 2015-03-27: qty 2

## 2015-03-27 NOTE — Progress Notes (Signed)
Only concern patient offers today is feeling fatigued.

## 2015-03-31 NOTE — Progress Notes (Signed)
[place]  [phone] [phone]  ID: Nollie Terlizzi [place] OB: 1933/03/08  MR#: [id number]  JQZ#:[id number]  Patient Care Team: Morton Peters., MD as PCP - General (Unknown Physician Specialty) [name], MD as Consulting Physician (Oncology)  CHIEF COMPLAINT:  Chief Complaint  Patient presents with  . Follow-up    Hairy cell leukemia    INTERVAL HISTORY: Patient returns to clinic today for consideration of cycle 5 of 8 of weekly Rituxan. His weakness and fatigue are improving , but still evident.  He complains of mild dyspnea on exertion, but no longer requires oxygen. He denies any fevers. He has no neurologic complaints. He denies any chest pain. He denies any weight loss. He has no urinary complaints. Patient offers no further specific complaints.   REVIEW OF SYSTEMS:   Review of Systems  Constitutional: Positive for malaise/fatigue. Negative for fever.  Respiratory: Negative.   Cardiovascular: Negative.   Gastrointestinal: Negative for nausea and vomiting.  Musculoskeletal: Negative.   Neurological: Positive for weakness.    As per HPI. Otherwise, a complete review of systems is negatve.  PAST MEDICAL HISTORY: Past Medical History  Diagnosis Date  . Coronary artery disease     a. 2000 s/p CABG;  b. 10/2008 Neg MV, EF 64%.  [name] BPH (benign prostatic hyperplasia)   . Hyperlipidemia   . Leukemia   . H/O echocardiogram     a. 10/2005 Echo: nl EF.  [name] Atypical chest pain     a. 08/2014  . Asymptomatic Sinus Bradycardia   . Asthma   . Hypertension   . GERD (gastroesophageal reflux disease)   . HTN (hypertension)   . CKD (chronic kidney disease), stage III   . COPD (chronic obstructive pulmonary disease)   . Shortness of breath dyspnea   . Dysrhythmia     PAST SURGICAL HISTORY: Past Surgical History  Procedure Laterality Date  . Coronary artery bypass graft  2000  . Cataract extraction      FAMILY  HISTORY Family History  Problem Relation Age of Onset  . Other      no premature CAD.       ADVANCED DIRECTIVES:    HEALTH MAINTENANCE: Social History  Substance Use Topics  . Smoking status: Never Smoker   . Smokeless tobacco: Never Used  . Alcohol Use: No     Colonoscopy:  PAP:  Bone density:  Lipid panel:  No Known Allergies  Current Outpatient Prescriptions  Medication Sig Dispense Refill  . aspirin 81 MG EC tablet Take 2 tablets (162 mg total) by mouth daily. 30 tablet 0  . atorvastatin (LIPITOR) 40 MG tablet Take 20 mg by mouth daily.     . dapsone 100 MG tablet TAKE 1 TABLET BY MOUTH EVERY DAY 30 tablet 3  . Multiple Vitamin (MULTIVITAMIN) tablet Take 1 tablet by mouth daily.      . OXYGEN Inhale 2 L/min into the lungs as needed (for breathing). Oxygen @@ 2 liters via nasal cannula as needed through the day.    . pantoprazole (PROTONIX) 40 MG tablet Take 40 mg by mouth daily.    . prochlorperazine (COMPAZINE) 10 MG tablet Take 1 tablet (10 mg total) by mouth every 6 (six) hours as needed for nausea or vomiting. 30 tablet 0  . ranitidine (ZANTAC) 150 MG capsule Take 150 mg by mouth 2 (two) times daily as needed for heartburn.     . sulfamethoxazole-trimethoprim (BACTRIM DS,SEPTRA DS) 800-160 MG per  tablet Take 1 tablet by mouth 3 (three) times a week. 30 tablet 3  . tamsulosin (FLOMAX) 0.4 MG CAPS capsule Take 0.4 mg by mouth daily.    [name] tiotropium (SPIRIVA) 18 MCG inhalation capsule Place 1 capsule (18 mcg total) into inhaler and inhale daily. 30 capsule 12  . valACYclovir (VALTREX) 1000 MG tablet Take 1 tablet (1,000 mg total) by mouth daily. 30 tablet 3   No current facility-administered medications for this visit.   Facility-Administered Medications Ordered in Other Visits  Medication Dose Route Frequency Provider Last Rate Last Dose  . sodium chloride 0.9 % injection 10 mL  10 mL Intracatheter PRN [name], MD      . sodium chloride 0.9 % injection  10 mL  10 mL Intracatheter PRN [name], MD        OBJECTIVE: Filed Vitals:   03/27/15 0932  BP: 128/58  Pulse: 60  Temp: 97 F (36.1 C)  Resp: 16     Body mass index is 24.85 kg/(m^2).    ECOG FS:1 - Symptomatic but completely ambulatory  General: Well-developed, well-nourished, no acute distress. Eyes: anicteric sclera. Lungs: Clear to auscultation bilaterally. Heart: Regular rate and rhythm. No rubs, murmurs, or gallops. Abdomen: Soft, nontender, nondistended. No organomegaly noted, normoactive bowel sounds. Musculoskeletal: No edema, cyanosis, or clubbing. Neuro: Alert, answering all questions appropriately. Cranial nerves grossly intact. Skin: No rashes or petechiae noted. Psych: Normal affect.   LAB RESULTS:  Lab Results  Component Value Date   NA 139 03/27/2015   K 3.7 03/27/2015   CL 110 03/27/2015   CO2 23 03/27/2015   GLUCOSE 109* 03/27/2015   BUN 19 03/27/2015   CREATININE 1.05 03/27/2015   CALCIUM 8.4* 03/27/2015   PROT 6.3* 03/27/2015   ALBUMIN 4.0 03/27/2015   AST 34 03/27/2015   ALT 12* 03/27/2015   ALKPHOS 54 03/27/2015   BILITOT 0.7 03/27/2015   GFRNONAA >60 03/27/2015   GFRAA >60 03/27/2015    Lab Results  Component Value Date   WBC 1.8* 03/27/2015   NEUTROABS 1.1* 03/27/2015   HGB 8.8* 03/27/2015   HCT 26.4* 03/27/2015   MCV 123.5* 03/27/2015   PLT 148* 03/27/2015     STUDIES: No results found.  ASSESSMENT:  Hairy cell leukemia.  PLAN:   1. Hairy cell leukemia: Patient initially received cladribine in 2006 and then was treated for a relapse in 2011 and again in 2014. He continues to have pancytopenia which is improving. Bone marrow biopsy confirmed recurrence of disease with 80-90% hairy cell leukemia noted in his specimen. Patient completed cladribine 0.15 mg/kg on days 1 through 5. Proceed with cycle 5 of 8 of weekly Rituxan 375 mg/m2. Return to clinic in 1 week for consideration of cycle 6. Will likely repeat bone marrow  biopsy at the conclusion of his 8 weeks of Rituxan.  Patient was given prescriptions for Valtrex and Bactrim for prophylaxis. 2. Pancytopenia: Improving, secondary to chemotherapy as well as Hairy cell.  3. Shortness breath:  Improved. Patient no longer requires supplemental oxygen. Patient had an arterial blood gas drawn on February 22, 2015 without supplemental oxygen that reported an oxygen saturation of 94% and a PO2 of 82.  Patient expressed understanding and was in agreement with this plan. He also understands that He can call clinic at any time with any questions, concerns, or complaints.     [name], MD   03/31/2015 1:16 PM

## 2015-04-03 ENCOUNTER — Inpatient Hospital Stay: Payer: Medicare PPO

## 2015-04-03 ENCOUNTER — Inpatient Hospital Stay (HOSPITAL_BASED_OUTPATIENT_CLINIC_OR_DEPARTMENT_OTHER): Payer: Medicare PPO | Admitting: Oncology

## 2015-04-03 ENCOUNTER — Other Ambulatory Visit: Payer: Medicare PPO

## 2015-04-03 ENCOUNTER — Ambulatory Visit: Payer: Medicare PPO | Admitting: Oncology

## 2015-04-03 VITALS — BP 135/66 | HR 66 | Temp 98.0°F | Resp 16 | Wt 160.5 lb

## 2015-04-03 DIAGNOSIS — R531 Weakness: Secondary | ICD-10-CM

## 2015-04-03 DIAGNOSIS — K219 Gastro-esophageal reflux disease without esophagitis: Secondary | ICD-10-CM

## 2015-04-03 DIAGNOSIS — C9140 Hairy cell leukemia not having achieved remission: Secondary | ICD-10-CM

## 2015-04-03 DIAGNOSIS — N183 Chronic kidney disease, stage 3 (moderate): Secondary | ICD-10-CM

## 2015-04-03 DIAGNOSIS — E785 Hyperlipidemia, unspecified: Secondary | ICD-10-CM

## 2015-04-03 DIAGNOSIS — D61818 Other pancytopenia: Secondary | ICD-10-CM | POA: Diagnosis not present

## 2015-04-03 DIAGNOSIS — R0609 Other forms of dyspnea: Secondary | ICD-10-CM | POA: Diagnosis not present

## 2015-04-03 DIAGNOSIS — Z5111 Encounter for antineoplastic chemotherapy: Secondary | ICD-10-CM | POA: Diagnosis not present

## 2015-04-03 DIAGNOSIS — R5383 Other fatigue: Secondary | ICD-10-CM

## 2015-04-03 DIAGNOSIS — I129 Hypertensive chronic kidney disease with stage 1 through stage 4 chronic kidney disease, or unspecified chronic kidney disease: Secondary | ICD-10-CM

## 2015-04-03 DIAGNOSIS — R5381 Other malaise: Secondary | ICD-10-CM

## 2015-04-03 DIAGNOSIS — J449 Chronic obstructive pulmonary disease, unspecified: Secondary | ICD-10-CM

## 2015-04-03 DIAGNOSIS — I2510 Atherosclerotic heart disease of native coronary artery without angina pectoris: Secondary | ICD-10-CM

## 2015-04-03 LAB — COMPREHENSIVE METABOLIC PANEL
ALK PHOS: 64 U/L (ref 38–126)
ALT: 9 U/L — ABNORMAL LOW (ref 17–63)
ANION GAP: 3 — AB (ref 5–15)
AST: 27 U/L (ref 15–41)
Albumin: 4.1 g/dL (ref 3.5–5.0)
BUN: 20 mg/dL (ref 6–20)
CALCIUM: 8.7 mg/dL — AB (ref 8.9–10.3)
CHLORIDE: 108 mmol/L (ref 101–111)
CO2: 26 mmol/L (ref 22–32)
Creatinine, Ser: 1.23 mg/dL (ref 0.61–1.24)
GFR calc non Af Amer: 54 mL/min — ABNORMAL LOW (ref >60)
Glucose, Bld: 106 mg/dL — ABNORMAL HIGH (ref 65–99)
Potassium: 3.7 mmol/L (ref 3.5–5.1)
SODIUM: 137 mmol/L (ref 135–145)
Total Bilirubin: 0.8 mg/dL (ref 0.3–1.2)
Total Protein: 6.5 g/dL (ref 6.5–8.1)

## 2015-04-03 LAB — CBC WITH DIFFERENTIAL/PLATELET
Basophils Absolute: 0 10*3/uL (ref 0–0.1)
Basophils Relative: 0 %
EOS ABS: 0.1 10*3/uL (ref 0–0.7)
EOS PCT: 2 %
HCT: 28.5 % — ABNORMAL LOW (ref 40.0–52.0)
Hemoglobin: 9.5 g/dL — ABNORMAL LOW (ref 13.0–18.0)
LYMPHS ABS: 0.2 10*3/uL — AB (ref 1.0–3.6)
Lymphocytes Relative: 5 %
MCH: 39.9 pg — AB (ref 26.0–34.0)
MCHC: 33.4 g/dL (ref 32.0–36.0)
MCV: 119.5 fL — ABNORMAL HIGH (ref 80.0–100.0)
MONOS PCT: 15 %
Monocytes Absolute: 0.7 10*3/uL (ref 0.2–1.0)
Neutro Abs: 3.8 10*3/uL (ref 1.4–6.5)
Neutrophils Relative %: 78 %
PLATELETS: 140 10*3/uL — AB (ref 150–440)
RBC: 2.38 MIL/uL — ABNORMAL LOW (ref 4.40–5.90)
RDW: 16.4 % — ABNORMAL HIGH (ref 11.5–14.5)
WBC: 4.9 10*3/uL (ref 3.8–10.6)

## 2015-04-03 MED ORDER — DIPHENHYDRAMINE HCL 25 MG PO CAPS
50.0000 mg | ORAL_CAPSULE | Freq: Once | ORAL | Status: AC
  Administered 2015-04-03: 50 mg via ORAL
  Filled 2015-04-03: qty 2

## 2015-04-03 MED ORDER — SODIUM CHLORIDE 0.9 % IV SOLN
Freq: Once | INTRAVENOUS | Status: AC
  Administered 2015-04-03: 10:00:00 via INTRAVENOUS
  Filled 2015-04-03: qty 1000

## 2015-04-03 MED ORDER — ACETAMINOPHEN 325 MG PO TABS
650.0000 mg | ORAL_TABLET | Freq: Once | ORAL | Status: AC
Start: 2015-04-03 — End: 2015-04-03
  Administered 2015-04-03: 650 mg via ORAL
  Filled 2015-04-03: qty 2

## 2015-04-03 MED ORDER — RITUXIMAB CHEMO INJECTION 500 MG/50ML
375.0000 mg/m2 | Freq: Once | INTRAVENOUS | Status: AC
  Administered 2015-04-03: 700 mg via INTRAVENOUS
  Filled 2015-04-03: qty 70

## 2015-04-03 MED ORDER — SODIUM CHLORIDE 0.9 % IV SOLN: 375.0000 mg/m2 | Freq: Once | INTRAVENOUS | Status: DC

## 2015-04-03 NOTE — Progress Notes (Signed)
[place]  [phone] [phone]  ID: Frank Mcdaniel OB: April 25, 1933  MR#: [id number]  [id number]#:[id number]  Patient Care Team: Morton Peters., MD as PCP - General (Unknown Physician Specialty) [name], MD as Consulting Physician (Oncology)  CHIEF COMPLAINT:  Chief Complaint  Patient presents with  . Follow-up    Hairy cell leukemia    INTERVAL HISTORY: Patient returns to clinic today for consideration of cycle 6 of 8 of weekly Rituxan. His weakness and fatigue are improving , but still evident.  He complains of mild dyspnea on exertion, but no longer requires oxygen. He denies any fevers. He has no neurologic complaints. He denies any chest pain. He denies any weight loss. He has no urinary complaints. Patient offers no further specific complaints.   REVIEW OF SYSTEMS:   Review of Systems  Constitutional: Positive for malaise/fatigue. Negative for fever.  Respiratory: Negative.   Cardiovascular: Negative.   Gastrointestinal: Negative for nausea and vomiting.  Musculoskeletal: Negative.   Neurological: Positive for weakness.    As per HPI. Otherwise, a complete review of systems is negatve.  PAST MEDICAL HISTORY: Past Medical History  Diagnosis Date  . Coronary artery disease     a. 2000 s/p CABG;  b. 10/2008 Neg MV, EF 64%.  [name] BPH (benign prostatic hyperplasia)   . Hyperlipidemia   . Leukemia   . H/O echocardiogram     a. 10/2005 Echo: nl EF.  [name] Atypical chest pain     a. 08/2014  . Asymptomatic Sinus Bradycardia   . Asthma   . Hypertension   . GERD (gastroesophageal reflux disease)   . HTN (hypertension)   . CKD (chronic kidney disease), stage III   . COPD (chronic obstructive pulmonary disease)   . Shortness of breath dyspnea   . Dysrhythmia     PAST SURGICAL HISTORY: Past Surgical History  Procedure Laterality Date  . Coronary artery bypass graft  2000  . Cataract extraction      FAMILY  HISTORY Family History  Problem Relation Age of Onset  . Other      no premature CAD.       ADVANCED DIRECTIVES:    HEALTH MAINTENANCE: Social History  Substance Use Topics  . Smoking status: Never Smoker   . Smokeless tobacco: Never Used  . Alcohol Use: No     Colonoscopy:  PAP:  Bone density:  Lipid panel:  No Known Allergies  Current Outpatient Prescriptions  Medication Sig Dispense Refill  . aspirin 81 MG EC tablet Take 2 tablets (162 mg total) by mouth daily. 30 tablet 0  . atorvastatin (LIPITOR) 40 MG tablet Take 20 mg by mouth daily.     . dapsone 100 MG tablet TAKE 1 TABLET BY MOUTH EVERY DAY 30 tablet 3  . Multiple Vitamin (MULTIVITAMIN) tablet Take 1 tablet by mouth daily.      . OXYGEN Inhale 2 L/min into the lungs as needed (for breathing). Oxygen @@ 2 liters via nasal cannula as needed through the day.    . pantoprazole (PROTONIX) 40 MG tablet Take 40 mg by mouth daily.    . prochlorperazine (COMPAZINE) 10 MG tablet Take 1 tablet (10 mg total) by mouth every 6 (six) hours as needed for nausea or vomiting. 30 tablet 0  . ranitidine (ZANTAC) 150 MG capsule Take 150 mg by mouth 2 (two) times daily as needed for heartburn.     . sulfamethoxazole-trimethoprim (BACTRIM DS,SEPTRA DS) 800-160 MG per  tablet Take 1 tablet by mouth 3 (three) times a week. 30 tablet 3  . tamsulosin (FLOMAX) 0.4 MG CAPS capsule Take 0.4 mg by mouth daily.    [name] tiotropium (SPIRIVA) 18 MCG inhalation capsule Place 1 capsule (18 mcg total) into inhaler and inhale daily. 30 capsule 12  . valACYclovir (VALTREX) 1000 MG tablet Take 1 tablet (1,000 mg total) by mouth daily. 30 tablet 3   No current facility-administered medications for this visit.   Facility-Administered Medications Ordered in Other Visits  Medication Dose Route Frequency Provider Last Rate Last Dose  . sodium chloride 0.9 % injection 10 mL  10 mL Intracatheter PRN [name], MD      . sodium chloride 0.9 % injection  10 mL  10 mL Intracatheter PRN [name], MD        OBJECTIVE: Filed Vitals:   04/03/15 0916  BP: 135/66  Pulse: 66  Temp: 98 F (36.7 C)  Resp: 16     Body mass index is 24.41 kg/(m^2).    ECOG FS:1 - Symptomatic but completely ambulatory  General: Well-developed, well-nourished, no acute distress. Eyes: anicteric sclera. Lungs: Clear to auscultation bilaterally. Heart: Regular rate and rhythm. No rubs, murmurs, or gallops. Abdomen: Soft, nontender, nondistended. No organomegaly noted, normoactive bowel sounds. Musculoskeletal: No edema, cyanosis, or clubbing. Neuro: Alert, answering all questions appropriately. Cranial nerves grossly intact. Skin: No rashes or petechiae noted. Psych: Normal affect.   LAB RESULTS:  Lab Results  Component Value Date   NA 137 04/03/2015   K 3.7 04/03/2015   CL 108 04/03/2015   CO2 26 04/03/2015   GLUCOSE 106* 04/03/2015   BUN 20 04/03/2015   CREATININE 1.23 04/03/2015   CALCIUM 8.7* 04/03/2015   PROT 6.5 04/03/2015   ALBUMIN 4.1 04/03/2015   AST 27 04/03/2015   ALT 9* 04/03/2015   ALKPHOS 64 04/03/2015   BILITOT 0.8 04/03/2015   GFRNONAA 54* 04/03/2015   GFRAA >60 04/03/2015    Lab Results  Component Value Date   WBC 4.9 04/03/2015   NEUTROABS 3.8 04/03/2015   HGB 9.5* 04/03/2015   HCT 28.5* 04/03/2015   MCV 119.5* 04/03/2015   PLT 140* 04/03/2015     STUDIES: No results found.  ASSESSMENT:  Hairy cell leukemia.  PLAN:   1. Hairy cell leukemia: Patient initially received cladribine in 2006 and then was treated for a relapse in 2011 and again in 2014. He continues to have pancytopenia which is improving and his white cell count is now within normal limits.  Bone marrow biopsy confirmed recurrence of disease with 80-90% hairy cell leukemia noted in his specimen. Patient completed cladribine 0.15 mg/kg on days 1 through 5. Proceed with cycle 6 of 8 of weekly Rituxan 375 mg/m2. Return to clinic in 1 week for  consideration of cycle 7. Will likely repeat bone marrow biopsy at the conclusion of his 8 weeks of Rituxan.  Patient was given prescriptions for Valtrex and Bactrim for prophylaxis. 2. Pancytopenia: Improving, secondary to chemotherapy as well as Hairy cell.  3. Shortness breath:  Improved. Patient no longer requires supplemental oxygen. Patient had an arterial blood gas drawn on February 22, 2015 without supplemental oxygen that reported an oxygen saturation of 94% and a PO2 of 82.  Patient expressed understanding and was in agreement with this plan. He also understands that He can call clinic at any time with any questions, concerns, or complaints.     [name], MD  04/07/2015 9:11 AM

## 2015-04-03 NOTE — Progress Notes (Signed)
Patient still feeling fatigued and had diarrhea last night but contributes the diarrhea to something he had eaten.

## 2015-04-11 ENCOUNTER — Inpatient Hospital Stay (HOSPITAL_BASED_OUTPATIENT_CLINIC_OR_DEPARTMENT_OTHER): Payer: Medicare PPO | Admitting: Oncology

## 2015-04-11 ENCOUNTER — Inpatient Hospital Stay: Payer: Medicare PPO

## 2015-04-11 ENCOUNTER — Inpatient Hospital Stay: Payer: Medicare PPO | Attending: Oncology

## 2015-04-11 VITALS — BP 153/61 | HR 63 | Temp 96.2°F | Resp 18 | Wt 161.4 lb

## 2015-04-11 DIAGNOSIS — R110 Nausea: Secondary | ICD-10-CM | POA: Diagnosis not present

## 2015-04-11 DIAGNOSIS — N183 Chronic kidney disease, stage 3 (moderate): Secondary | ICD-10-CM | POA: Insufficient documentation

## 2015-04-11 DIAGNOSIS — R531 Weakness: Secondary | ICD-10-CM | POA: Insufficient documentation

## 2015-04-11 DIAGNOSIS — R5383 Other fatigue: Secondary | ICD-10-CM | POA: Insufficient documentation

## 2015-04-11 DIAGNOSIS — C9140 Hairy cell leukemia not having achieved remission: Secondary | ICD-10-CM | POA: Diagnosis not present

## 2015-04-11 DIAGNOSIS — I129 Hypertensive chronic kidney disease with stage 1 through stage 4 chronic kidney disease, or unspecified chronic kidney disease: Secondary | ICD-10-CM | POA: Insufficient documentation

## 2015-04-11 DIAGNOSIS — Z5111 Encounter for antineoplastic chemotherapy: Secondary | ICD-10-CM | POA: Insufficient documentation

## 2015-04-11 DIAGNOSIS — R5381 Other malaise: Secondary | ICD-10-CM | POA: Insufficient documentation

## 2015-04-11 DIAGNOSIS — N400 Enlarged prostate without lower urinary tract symptoms: Secondary | ICD-10-CM | POA: Insufficient documentation

## 2015-04-11 DIAGNOSIS — E785 Hyperlipidemia, unspecified: Secondary | ICD-10-CM | POA: Diagnosis not present

## 2015-04-11 DIAGNOSIS — J449 Chronic obstructive pulmonary disease, unspecified: Secondary | ICD-10-CM

## 2015-04-11 DIAGNOSIS — R0609 Other forms of dyspnea: Secondary | ICD-10-CM | POA: Insufficient documentation

## 2015-04-11 DIAGNOSIS — T451X5S Adverse effect of antineoplastic and immunosuppressive drugs, sequela: Secondary | ICD-10-CM | POA: Insufficient documentation

## 2015-04-11 DIAGNOSIS — I2510 Atherosclerotic heart disease of native coronary artery without angina pectoris: Secondary | ICD-10-CM

## 2015-04-11 DIAGNOSIS — Z7982 Long term (current) use of aspirin: Secondary | ICD-10-CM | POA: Insufficient documentation

## 2015-04-11 DIAGNOSIS — D61818 Other pancytopenia: Secondary | ICD-10-CM | POA: Insufficient documentation

## 2015-04-11 DIAGNOSIS — Z79899 Other long term (current) drug therapy: Secondary | ICD-10-CM | POA: Diagnosis not present

## 2015-04-11 DIAGNOSIS — Z95818 Presence of other cardiac implants and grafts: Secondary | ICD-10-CM | POA: Diagnosis not present

## 2015-04-11 DIAGNOSIS — K219 Gastro-esophageal reflux disease without esophagitis: Secondary | ICD-10-CM | POA: Insufficient documentation

## 2015-04-11 LAB — COMPREHENSIVE METABOLIC PANEL
ALBUMIN: 4 g/dL (ref 3.5–5.0)
ALT: 10 U/L — AB (ref 17–63)
AST: 27 U/L (ref 15–41)
Alkaline Phosphatase: 62 U/L (ref 38–126)
Anion gap: 4 — ABNORMAL LOW (ref 5–15)
BUN: 15 mg/dL (ref 6–20)
CHLORIDE: 109 mmol/L (ref 101–111)
CO2: 24 mmol/L (ref 22–32)
CREATININE: 1.31 mg/dL — AB (ref 0.61–1.24)
Calcium: 8.5 mg/dL — ABNORMAL LOW (ref 8.9–10.3)
GFR calc Af Amer: 58 mL/min — ABNORMAL LOW (ref >60)
GFR calc non Af Amer: 50 mL/min — ABNORMAL LOW (ref >60)
GLUCOSE: 121 mg/dL — AB (ref 65–99)
POTASSIUM: 3.4 mmol/L — AB (ref 3.5–5.1)
Sodium: 137 mmol/L (ref 135–145)
Total Bilirubin: 0.5 mg/dL (ref 0.3–1.2)
Total Protein: 6.3 g/dL — ABNORMAL LOW (ref 6.5–8.1)

## 2015-04-11 LAB — CBC WITH DIFFERENTIAL/PLATELET
BASOS ABS: 0 10*3/uL (ref 0–0.1)
BASOS PCT: 0 %
EOS ABS: 0.3 10*3/uL (ref 0–0.7)
EOS PCT: 7 %
HCT: 29.5 % — ABNORMAL LOW (ref 40.0–52.0)
Hemoglobin: 9.9 g/dL — ABNORMAL LOW (ref 13.0–18.0)
LYMPHS PCT: 7 %
Lymphs Abs: 0.3 10*3/uL — ABNORMAL LOW (ref 1.0–3.6)
MCH: 38.4 pg — ABNORMAL HIGH (ref 26.0–34.0)
MCHC: 33.4 g/dL (ref 32.0–36.0)
MCV: 114.8 fL — ABNORMAL HIGH (ref 80.0–100.0)
MONO ABS: 0.5 10*3/uL (ref 0.2–1.0)
Monocytes Relative: 12 %
Neutro Abs: 3.4 10*3/uL (ref 1.4–6.5)
Neutrophils Relative %: 74 %
PLATELETS: 143 10*3/uL — AB (ref 150–440)
RBC: 2.57 MIL/uL — AB (ref 4.40–5.90)
RDW: 15.1 % — AB (ref 11.5–14.5)
WBC: 4.6 10*3/uL (ref 3.8–10.6)

## 2015-04-11 MED ORDER — ACETAMINOPHEN 325 MG PO TABS
650.0000 mg | ORAL_TABLET | Freq: Once | ORAL | Status: AC
  Administered 2015-04-11: 650 mg via ORAL
  Filled 2015-04-11: qty 2

## 2015-04-11 MED ORDER — SODIUM CHLORIDE 0.9 % IV SOLN
375.0000 mg/m2 | Freq: Once | INTRAVENOUS | Status: AC
  Administered 2015-04-11: 700 mg via INTRAVENOUS
  Filled 2015-04-11: qty 60

## 2015-04-11 MED ORDER — DIPHENHYDRAMINE HCL 25 MG PO CAPS
50.0000 mg | ORAL_CAPSULE | Freq: Once | ORAL | Status: AC
  Administered 2015-04-11: 50 mg via ORAL
  Filled 2015-04-11: qty 2

## 2015-04-11 MED ORDER — SODIUM CHLORIDE 0.9 % IV SOLN
Freq: Once | INTRAVENOUS | Status: AC
  Administered 2015-04-11: 10:00:00 via INTRAVENOUS
  Filled 2015-04-11: qty 1000

## 2015-04-11 MED ORDER — SODIUM CHLORIDE 0.9 % IV SOLN: 375.0000 mg/m2 | Freq: Once | INTRAVENOUS | Status: DC

## 2015-04-11 NOTE — Progress Notes (Signed)
Patient has nausea that is not relieved much the Compazine.  Also having constipation for the past few days and will take Miralax.

## 2015-04-11 NOTE — Progress Notes (Signed)
[place]  Telephone:([phone] [phone]  ID: Frank Mcdaniel [place] OB: 1932/12/12  MR#: [id number]  WNI#:[id number]  Patient Care Team: Morton Peters., MD as PCP - General (Unknown Physician Specialty) [name], MD as Consulting Physician (Oncology)  CHIEF COMPLAINT:  Chief Complaint  Patient presents with  . Follow-up    Hairy cell leukemia    INTERVAL HISTORY: Patient returns to clinic today for consideration of cycle 7 of 8 of weekly Rituxan. His weakness and fatigue are improving , but still evident.  He had some mild nausea this morning that resolved with Compazine.  He continues to have mild dyspnea on exertion, but no longer requires oxygen. He denies any fevers. He has no neurologic complaints. He denies any chest pain. He denies any weight loss. He has no urinary complaints. Patient offers no further specific complaints.   REVIEW OF SYSTEMS:   Review of Systems  Constitutional: Positive for malaise/fatigue. Negative for fever.  Respiratory: Negative.   Cardiovascular: Negative.   Gastrointestinal: Positive for nausea. Negative for vomiting.  Musculoskeletal: Negative.   Neurological: Positive for weakness.    As per HPI. Otherwise, a complete review of systems is negatve.  PAST MEDICAL HISTORY: Past Medical History  Diagnosis Date  . Coronary artery disease     a. 2000 s/p CABG;  b. 10/2008 Neg MV, EF 64%.  [name] BPH (benign prostatic hyperplasia)   . Hyperlipidemia   . Leukemia   . H/O echocardiogram     a. 10/2005 Echo: nl EF.  [name] Atypical chest pain     a. 08/2014  . Asymptomatic Sinus Bradycardia   . Asthma   . Hypertension   . GERD (gastroesophageal reflux disease)   . HTN (hypertension)   . CKD (chronic kidney disease), stage III   . COPD (chronic obstructive pulmonary disease)   . Shortness of breath dyspnea   . Dysrhythmia     PAST SURGICAL HISTORY: Past Surgical History  Procedure Laterality Date    . Coronary artery bypass graft  2000  . Cataract extraction      FAMILY HISTORY Family History  Problem Relation Age of Onset  . Other      no premature CAD.       ADVANCED DIRECTIVES:    HEALTH MAINTENANCE: Social History  Substance Use Topics  . Smoking status: Never Smoker   . Smokeless tobacco: Never Used  . Alcohol Use: No     Colonoscopy:  PAP:  Bone density:  Lipid panel:  No Known Allergies  Current Outpatient Prescriptions  Medication Sig Dispense Refill  . aspirin 81 MG EC tablet Take 2 tablets (162 mg total) by mouth daily. 30 tablet 0  . atorvastatin (LIPITOR) 40 MG tablet Take 20 mg by mouth daily.     . dapsone 100 MG tablet TAKE 1 TABLET BY MOUTH EVERY DAY 30 tablet 3  . Multiple Vitamin (MULTIVITAMIN) tablet Take 1 tablet by mouth daily.      . pantoprazole (PROTONIX) 40 MG tablet Take 40 mg by mouth daily.    . prochlorperazine (COMPAZINE) 10 MG tablet Take 1 tablet (10 mg total) by mouth every 6 (six) hours as needed for nausea or vomiting. 30 tablet 0  . ranitidine (ZANTAC) 150 MG capsule Take 150 mg by mouth 2 (two) times daily as needed for heartburn.     . sulfamethoxazole-trimethoprim (BACTRIM DS,SEPTRA DS) 800-160 MG per tablet Take 1 tablet by mouth 3 (three) times a week. [place]  tablet 3  . tamsulosin (FLOMAX) 0.4 MG CAPS capsule Take 0.4 mg by mouth daily.    [name] tiotropium (SPIRIVA) 18 MCG inhalation capsule Place 1 capsule (18 mcg total) into inhaler and inhale daily. 30 capsule 12  . valACYclovir (VALTREX) 1000 MG tablet Take 1 tablet (1,000 mg total) by mouth daily. 30 tablet 3   No current facility-administered medications for this visit.   Facility-Administered Medications Ordered in Other Visits  Medication Dose Route Frequency Provider Last Rate Last Dose  . sodium chloride 0.9 % injection 10 mL  10 mL Intracatheter PRN [name], MD      . sodium chloride 0.9 % injection 10 mL  10 mL Intracatheter PRN [name], MD         OBJECTIVE: Filed Vitals:   04/11/15 0916  BP: 153/61  Pulse: 63  Temp: 96.2 F (35.7 C)  Resp: 18     Body mass index is 24.54 kg/(m^2).    ECOG FS:1 - Symptomatic but completely ambulatory  General: Well-developed, well-nourished, no acute distress. Eyes: anicteric sclera. Lungs: Clear to auscultation bilaterally. Heart: Regular rate and rhythm. No rubs, murmurs, or gallops. Abdomen: Soft, nontender, nondistended. No organomegaly noted, normoactive bowel sounds. Musculoskeletal: No edema, cyanosis, or clubbing. Neuro: Alert, answering all questions appropriately. Cranial nerves grossly intact. Skin: No rashes or petechiae noted. Psych: Normal affect.   LAB RESULTS:  Lab Results  Component Value Date   NA 137 04/11/2015   K 3.4* 04/11/2015   CL 109 04/11/2015   CO2 24 04/11/2015   GLUCOSE 121* 04/11/2015   BUN 15 04/11/2015   CREATININE 1.31* 04/11/2015   CALCIUM 8.5* 04/11/2015   PROT 6.3* 04/11/2015   ALBUMIN 4.0 04/11/2015   AST 27 04/11/2015   ALT 10* 04/11/2015   ALKPHOS 62 04/11/2015   BILITOT 0.5 04/11/2015   GFRNONAA 50* 04/11/2015   GFRAA 58* 04/11/2015    Lab Results  Component Value Date   WBC 4.6 04/11/2015   NEUTROABS 3.4 04/11/2015   HGB 9.9* 04/11/2015   HCT 29.5* 04/11/2015   MCV 114.8* 04/11/2015   PLT 143* 04/11/2015     STUDIES: No results found.  ASSESSMENT:  Hairy cell leukemia.  PLAN:   1. Hairy cell leukemia: Patient initially received cladribine in 2006 and then was treated for a relapse in 2011 and again in 2014. Bone marrow biopsy confirmed recurrence of disease with 80-90% hairy cell leukemia noted in his specimen. Patient completed cladribine 0.15 mg/kg on days 1 through 5. Proceed with cycle 7 of 8 of weekly Rituxan 375 mg/m2. Return to clinic in 1 week for consideration of cycle 8. Will likely repeat bone marrow biopsy at the conclusion of his 8 weeks of Rituxan.  Patient was given prescriptions for Valtrex and Bactrim  for prophylaxis. 2. Pancytopenia: Improving, secondary to chemotherapy as well as Hairy cell.  3. Shortness breath:  Improved. Patient no longer requires supplemental oxygen. Patient had an arterial blood gas drawn on February 22, 2015 without supplemental oxygen that reported an oxygen saturation of 94% and a PO2 of 82. 4. Nausea: Unlikely related to Rituxan. Continue Compazine as needed.   Patient expressed understanding and was in agreement with this plan. He also understands that He can call clinic at any time with any questions, concerns, or complaints.     [name], MD   04/11/2015 9:44 AM

## 2015-04-18 ENCOUNTER — Inpatient Hospital Stay: Payer: Medicare PPO

## 2015-04-18 ENCOUNTER — Inpatient Hospital Stay (HOSPITAL_BASED_OUTPATIENT_CLINIC_OR_DEPARTMENT_OTHER): Payer: Medicare PPO

## 2015-04-18 ENCOUNTER — Inpatient Hospital Stay (HOSPITAL_BASED_OUTPATIENT_CLINIC_OR_DEPARTMENT_OTHER): Payer: Medicare PPO | Admitting: Oncology

## 2015-04-18 VITALS — BP 126/64 | HR 54 | Temp 97.0°F | Resp 20

## 2015-04-18 VITALS — BP 134/64 | HR 59 | Temp 95.1°F | Resp 16 | Wt 161.6 lb

## 2015-04-18 DIAGNOSIS — Z5111 Encounter for antineoplastic chemotherapy: Secondary | ICD-10-CM | POA: Diagnosis not present

## 2015-04-18 DIAGNOSIS — R5381 Other malaise: Secondary | ICD-10-CM

## 2015-04-18 DIAGNOSIS — C9140 Hairy cell leukemia not having achieved remission: Secondary | ICD-10-CM | POA: Diagnosis not present

## 2015-04-18 DIAGNOSIS — R110 Nausea: Secondary | ICD-10-CM | POA: Diagnosis not present

## 2015-04-18 DIAGNOSIS — R531 Weakness: Secondary | ICD-10-CM | POA: Diagnosis not present

## 2015-04-18 DIAGNOSIS — R0609 Other forms of dyspnea: Secondary | ICD-10-CM

## 2015-04-18 DIAGNOSIS — R5383 Other fatigue: Secondary | ICD-10-CM

## 2015-04-18 DIAGNOSIS — Z95818 Presence of other cardiac implants and grafts: Secondary | ICD-10-CM

## 2015-04-18 DIAGNOSIS — N183 Chronic kidney disease, stage 3 (moderate): Secondary | ICD-10-CM

## 2015-04-18 DIAGNOSIS — E785 Hyperlipidemia, unspecified: Secondary | ICD-10-CM

## 2015-04-18 DIAGNOSIS — I2510 Atherosclerotic heart disease of native coronary artery without angina pectoris: Secondary | ICD-10-CM

## 2015-04-18 DIAGNOSIS — J449 Chronic obstructive pulmonary disease, unspecified: Secondary | ICD-10-CM

## 2015-04-18 DIAGNOSIS — I129 Hypertensive chronic kidney disease with stage 1 through stage 4 chronic kidney disease, or unspecified chronic kidney disease: Secondary | ICD-10-CM

## 2015-04-18 LAB — CBC WITH DIFFERENTIAL/PLATELET
BASOS ABS: 0 10*3/uL (ref 0–0.1)
BASOS PCT: 0 %
Eosinophils Absolute: 0.5 10*3/uL (ref 0–0.7)
Eosinophils Relative: 12 %
HEMATOCRIT: 30.7 % — AB (ref 40.0–52.0)
Hemoglobin: 10.2 g/dL — ABNORMAL LOW (ref 13.0–18.0)
LYMPHS PCT: 7 %
Lymphs Abs: 0.3 10*3/uL — ABNORMAL LOW (ref 1.0–3.6)
MCH: 37.6 pg — ABNORMAL HIGH (ref 26.0–34.0)
MCHC: 33.3 g/dL (ref 32.0–36.0)
MCV: 112.8 fL — AB (ref 80.0–100.0)
Monocytes Absolute: 0.5 10*3/uL (ref 0.2–1.0)
Monocytes Relative: 12 %
NEUTROS ABS: 3.2 10*3/uL (ref 1.4–6.5)
Neutrophils Relative %: 69 %
PLATELETS: 154 10*3/uL (ref 150–440)
RBC: 2.72 MIL/uL — AB (ref 4.40–5.90)
RDW: 14.8 % — AB (ref 11.5–14.5)
WBC: 4.6 10*3/uL (ref 3.8–10.6)

## 2015-04-18 LAB — COMPREHENSIVE METABOLIC PANEL
ALBUMIN: 4.2 g/dL (ref 3.5–5.0)
ALT: 8 U/L — AB (ref 17–63)
AST: 25 U/L (ref 15–41)
Alkaline Phosphatase: 62 U/L (ref 38–126)
Anion gap: 4 — ABNORMAL LOW (ref 5–15)
BILIRUBIN TOTAL: 0.9 mg/dL (ref 0.3–1.2)
BUN: 15 mg/dL (ref 6–20)
CHLORIDE: 108 mmol/L (ref 101–111)
CO2: 27 mmol/L (ref 22–32)
CREATININE: 1.3 mg/dL — AB (ref 0.61–1.24)
Calcium: 8.7 mg/dL — ABNORMAL LOW (ref 8.9–10.3)
GFR calc Af Amer: 58 mL/min — ABNORMAL LOW (ref >60)
GFR, EST NON AFRICAN AMERICAN: 50 mL/min — AB (ref >60)
GLUCOSE: 102 mg/dL — AB (ref 65–99)
POTASSIUM: 4.3 mmol/L (ref 3.5–5.1)
Sodium: 139 mmol/L (ref 135–145)
Total Protein: 6.4 g/dL — ABNORMAL LOW (ref 6.5–8.1)

## 2015-04-18 MED ORDER — SODIUM CHLORIDE 0.9 % IV SOLN
Freq: Once | INTRAVENOUS | Status: AC
  Administered 2015-04-18: 12:00:00 via INTRAVENOUS
  Filled 2015-04-18: qty 1000

## 2015-04-18 MED ORDER — ACETAMINOPHEN 325 MG PO TABS
650.0000 mg | ORAL_TABLET | Freq: Once | ORAL | Status: AC
Start: 2015-04-18 — End: 2015-04-18
  Administered 2015-04-18: 650 mg via ORAL
  Filled 2015-04-18: qty 2

## 2015-04-18 MED ORDER — SODIUM CHLORIDE 0.9 % IV SOLN
375.0000 mg/m2 | Freq: Once | INTRAVENOUS | Status: AC
  Administered 2015-04-18: 700 mg via INTRAVENOUS
  Filled 2015-04-18: qty 60

## 2015-04-18 MED ORDER — DIPHENHYDRAMINE HCL 25 MG PO CAPS
50.0000 mg | ORAL_CAPSULE | Freq: Once | ORAL | Status: AC
  Administered 2015-04-18: 50 mg via ORAL
  Filled 2015-04-18: qty 2

## 2015-04-18 MED ORDER — SODIUM CHLORIDE 0.9 % IV SOLN: 375.0000 mg/m2 | Freq: Once | INTRAVENOUS | Status: DC

## 2015-04-26 NOTE — Progress Notes (Signed)
[name]  [phone] [phone]  ID: Frank Mcdaniel OB: [birth year]  MR#: [id number]  OFH#:[id number]  Patient Care Team: Morton Peters., MD as PCP - General (Unknown Physician Specialty) [name], MD as Consulting Physician (Oncology)  CHIEF COMPLAINT:  Chief Complaint  Patient presents with  . Hairy cell leukemia    INTERVAL HISTORY: Patient returns to clinic today for consideration of cycle 8 of 8 of weekly Rituxan. His weakness and fatigue is unchanged.  He does not complain of nausea today. He continues to have mild dyspnea on exertion, but no longer requires oxygen. He denies any fevers. He has no neurologic complaints. He denies any chest pain. He denies any weight loss. He has no urinary complaints. Patient offers no further specific complaints.   REVIEW OF SYSTEMS:   Review of Systems  Constitutional: Positive for malaise/fatigue. Negative for fever.  Respiratory: Negative.   Cardiovascular: Negative.   Gastrointestinal: Positive for nausea. Negative for vomiting.  Musculoskeletal: Negative.   Neurological: Positive for weakness.    As per HPI. Otherwise, a complete review of systems is negatve.  PAST MEDICAL HISTORY: Past Medical History  Diagnosis Date  . Coronary artery disease     a. 2000 s/p CABG;  b. 10/2008 Neg MV, EF 64%.  [name] BPH (benign prostatic hyperplasia)   . Hyperlipidemia   . Leukemia   . H/O echocardiogram     a. 10/2005 Echo: nl EF.  [name] Atypical chest pain     a. 08/2014  . Asymptomatic Sinus Bradycardia   . Asthma   . Hypertension   . GERD (gastroesophageal reflux disease)   . HTN (hypertension)   . CKD (chronic kidney disease), stage III   . COPD (chronic obstructive pulmonary disease)   . Shortness of breath dyspnea   . Dysrhythmia     PAST SURGICAL HISTORY: Past Surgical History  Procedure Laterality Date  . Coronary artery bypass graft  2000  . Cataract extraction       FAMILY HISTORY Family History  Problem Relation Age of Onset  . Other      no premature CAD.       ADVANCED DIRECTIVES:    HEALTH MAINTENANCE: Social History  Substance Use Topics  . Smoking status: Never Smoker   . Smokeless tobacco: Never Used  . Alcohol Use: No     Colonoscopy:  PAP:  Bone density:  Lipid panel:  No Known Allergies  Current Outpatient Prescriptions  Medication Sig Dispense Refill  . aspirin 81 MG EC tablet Take 2 tablets (162 mg total) by mouth daily. 30 tablet 0  . atorvastatin (LIPITOR) 40 MG tablet Take 20 mg by mouth daily.     . dapsone 100 MG tablet TAKE 1 TABLET BY MOUTH EVERY DAY 30 tablet 3  . Multiple Vitamin (MULTIVITAMIN) tablet Take 1 tablet by mouth daily.      . pantoprazole (PROTONIX) 40 MG tablet Take 40 mg by mouth daily.    . prochlorperazine (COMPAZINE) 10 MG tablet Take 1 tablet (10 mg total) by mouth every 6 (six) hours as needed for nausea or vomiting. 30 tablet 0  . ranitidine (ZANTAC) 150 MG capsule Take 150 mg by mouth 2 (two) times daily as needed for heartburn.     . sulfamethoxazole-trimethoprim (BACTRIM DS,SEPTRA DS) 800-160 MG per tablet Take 1 tablet by mouth 3 (three) times a week. 30 tablet 3  . tamsulosin (FLOMAX) 0.4 MG CAPS capsule Take 0.4 mg by  mouth daily.    [name] tiotropium (SPIRIVA) 18 MCG inhalation capsule Place 1 capsule (18 mcg total) into inhaler and inhale daily. 30 capsule 12  . valACYclovir (VALTREX) 1000 MG tablet Take 1 tablet (1,000 mg total) by mouth daily. 30 tablet 3   No current facility-administered medications for this visit.   Facility-Administered Medications Ordered in Other Visits  Medication Dose Route Frequency Provider Last Rate Last Dose  . sodium chloride 0.9 % injection 10 mL  10 mL Intracatheter PRN [name], MD      . sodium chloride 0.9 % injection 10 mL  10 mL Intracatheter PRN [name], MD        OBJECTIVE: Filed Vitals:   04/18/15 1115  BP: 134/64   Pulse: 59  Temp: 95.1 F (35.1 C)  Resp: 16     Body mass index is 24.58 kg/(m^2).    ECOG FS:1 - Symptomatic but completely ambulatory  General: Well-developed, well-nourished, no acute distress. Eyes: anicteric sclera. Lungs: Clear to auscultation bilaterally. Heart: Regular rate and rhythm. No rubs, murmurs, or gallops. Abdomen: Soft, nontender, nondistended. No organomegaly noted, normoactive bowel sounds. Musculoskeletal: No edema, cyanosis, or clubbing. Neuro: Alert, answering all questions appropriately. Cranial nerves grossly intact. Skin: No rashes or petechiae noted. Psych: Normal affect.   LAB RESULTS:  Lab Results  Component Value Date   NA 139 04/18/2015   K 4.3 04/18/2015   CL 108 04/18/2015   CO2 27 04/18/2015   GLUCOSE 102* 04/18/2015   BUN 15 04/18/2015   CREATININE 1.30* 04/18/2015   CALCIUM 8.7* 04/18/2015   PROT 6.4* 04/18/2015   ALBUMIN 4.2 04/18/2015   AST 25 04/18/2015   ALT 8* 04/18/2015   ALKPHOS 62 04/18/2015   BILITOT 0.9 04/18/2015   GFRNONAA 50* 04/18/2015   GFRAA 58* 04/18/2015    Lab Results  Component Value Date   WBC 4.6 04/18/2015   NEUTROABS 3.2 04/18/2015   HGB 10.2* 04/18/2015   HCT 30.7* 04/18/2015   MCV 112.8* 04/18/2015   PLT 154 04/18/2015     STUDIES: No results found.  ASSESSMENT:  Hairy cell leukemia.  PLAN:   1. Hairy cell leukemia: Patient initially received cladribine in 2006 and then was treated for a relapse in 2011 and again in 2014. Bone marrow biopsy confirmed recurrence of disease with 80-90% hairy cell leukemia noted in his specimen. Patient completed cladribine 0.15 mg/kg on days 1 through 5. Proceed with cycle 8 of 8 of weekly Rituxan 375 mg/m2. We will get repeat bone marrow biopsy in 2 weeks to assess for residual disease. Patient will then return to clinic one week later to discuss the results and consideration of additional treatment if necessary. Patient has been instructed to discontinue Valtrex  and Bactrim prophylaxis, but may need to restart if another cycle of chemotherapy is planned. 2. Pancytopenia: Resolved. Patient only has residual anemia. 3. Shortness breath:  Improved. Patient no longer requires supplemental oxygen. Patient had an arterial blood gas drawn on February 22, 2015 without supplemental oxygen that reported an oxygen saturation of 94% and a PO2 of 82. 4. Nausea: Unlikely related to Rituxan. Continue Compazine as needed.   Patient expressed understanding and was in agreement with this plan. He also understands that He can call clinic at any time with any questions, concerns, or complaints.     [name], MD   04/26/2015 2:05 PM

## 2015-04-28 ENCOUNTER — Other Ambulatory Visit: Payer: Self-pay | Admitting: Radiology

## 2015-04-28 ENCOUNTER — Other Ambulatory Visit: Payer: Self-pay | Admitting: Physician Assistant

## 2015-05-01 ENCOUNTER — Other Ambulatory Visit: Payer: Self-pay | Admitting: Radiology

## 2015-05-01 ENCOUNTER — Ambulatory Visit
Admission: RE | Admit: 2015-05-01 | Discharge: 2015-05-01 | Disposition: A | Discharge status: Home / Self Care | Payer: Medicare PPO | Source: Ambulatory Visit | Attending: Oncology | Admitting: Oncology

## 2015-05-01 DIAGNOSIS — J45909 Unspecified asthma, uncomplicated: Secondary | ICD-10-CM | POA: Insufficient documentation

## 2015-05-01 DIAGNOSIS — E785 Hyperlipidemia, unspecified: Secondary | ICD-10-CM | POA: Insufficient documentation

## 2015-05-01 DIAGNOSIS — I129 Hypertensive chronic kidney disease with stage 1 through stage 4 chronic kidney disease, or unspecified chronic kidney disease: Secondary | ICD-10-CM | POA: Insufficient documentation

## 2015-05-01 DIAGNOSIS — C9140 Hairy cell leukemia not having achieved remission: Secondary | ICD-10-CM | POA: Insufficient documentation

## 2015-05-01 DIAGNOSIS — J449 Chronic obstructive pulmonary disease, unspecified: Secondary | ICD-10-CM | POA: Insufficient documentation

## 2015-05-01 DIAGNOSIS — I2510 Atherosclerotic heart disease of native coronary artery without angina pectoris: Secondary | ICD-10-CM | POA: Insufficient documentation

## 2015-05-01 DIAGNOSIS — N400 Enlarged prostate without lower urinary tract symptoms: Secondary | ICD-10-CM | POA: Diagnosis not present

## 2015-05-01 DIAGNOSIS — K219 Gastro-esophageal reflux disease without esophagitis: Secondary | ICD-10-CM | POA: Diagnosis not present

## 2015-05-01 LAB — CBC
HEMATOCRIT: 31.6 % — AB (ref 40.0–52.0)
HEMOGLOBIN: 10.5 g/dL — AB (ref 13.0–18.0)
MCH: 36.3 pg — AB (ref 26.0–34.0)
MCHC: 33.2 g/dL (ref 32.0–36.0)
MCV: 109.1 fL — ABNORMAL HIGH (ref 80.0–100.0)
Platelets: 153 10*3/uL (ref 150–440)
RBC: 2.9 MIL/uL — ABNORMAL LOW (ref 4.40–5.90)
RDW: 14.9 % — ABNORMAL HIGH (ref 11.5–14.5)
WBC: 4.3 10*3/uL (ref 3.8–10.6)

## 2015-05-01 LAB — DIFFERENTIAL
Basophils Absolute: 0 10*3/uL (ref 0–0.1)
Basophils Relative: 1 %
Eosinophils Absolute: 0.4 10*3/uL (ref 0–0.7)
Eosinophils Relative: 8 %
LYMPHS PCT: 6 %
Lymphs Abs: 0.2 10*3/uL — ABNORMAL LOW (ref 1.0–3.6)
MONO ABS: 0.5 10*3/uL (ref 0.2–1.0)
MONOS PCT: 12 %
Neutro Abs: 3.1 10*3/uL (ref 1.4–6.5)
Neutrophils Relative %: 73 %

## 2015-05-01 LAB — PROTIME-INR
INR: 1.14
PROTHROMBIN TIME: 14.8 s (ref 11.4–15.0)

## 2015-05-01 LAB — APTT: aPTT: 36 seconds (ref 24–36)

## 2015-05-01 MED ORDER — HYDROCODONE-ACETAMINOPHEN 5-325 MG PO TABS
1.0000 | ORAL_TABLET | ORAL | Status: DC | PRN
  Filled 2015-05-01: qty 2

## 2015-05-01 MED ORDER — SODIUM CHLORIDE 0.9 % IV SOLN: Freq: Once | INTRAVENOUS | Status: DC

## 2015-05-01 NOTE — Procedures (Signed)
Successful CT guided RT ILIAC BM ASP AND CORE BX NO COMP STABLE PATH PENDING FULL REPORT IN PACS

## 2015-05-01 NOTE — H&P (Signed)
Frank Mcdaniel is an 81 y.o. male.   Chief Complaint: here for repeat BM asp and core bx following chemo HPI: Hairy cell lymphoma s/p chemo Rx.  Needs repeat BM Bx to reassess. No compliants.  Past Medical History  Diagnosis Date  . Coronary artery disease     a. 2000 s/p CABG;  b. 10/2008 Neg MV, EF 64%.  [name] BPH (benign prostatic hyperplasia)   . Hyperlipidemia   . Leukemia   . H/O echocardiogram     a. 10/2005 Echo: nl EF.  [name] Atypical chest pain     a. 08/2014  . Asymptomatic Sinus Bradycardia   . Asthma   . Hypertension   . GERD (gastroesophageal reflux disease)   . HTN (hypertension)   . CKD (chronic kidney disease), stage III   . COPD (chronic obstructive pulmonary disease)   . Shortness of breath dyspnea   . Dysrhythmia     Past Surgical History  Procedure Laterality Date  . Coronary artery bypass graft  2000  . Cataract extraction      Family History  Problem Relation Age of Onset  . Other      no premature CAD.   Social History:  reports that he has never smoked. He has never used smokeless tobacco. He reports that he does not drink alcohol or use illicit drugs.  Allergies: No Known Allergies   (Not in a hospital admission)  Results for orders placed or performed during the hospital encounter of 05/01/15 (from the past 48 hour(s))  APTT upon arrival     Status: None   Collection Time: 05/01/15  8:06 AM  Result Value Ref Range   aPTT 36 24 - 36 seconds  CBC upon arrival     Status: Abnormal   Collection Time: 05/01/15  8:06 AM  Result Value Ref Range   WBC 4.3 3.8 - 10.6 K/uL   RBC 2.90 (L) 4.40 - 5.90 MIL/uL   Hemoglobin 10.5 (L) 13.0 - 18.0 g/dL   HCT 31.6 (L) 40.0 - 52.0 %   MCV 109.1 (H) 80.0 - 100.0 fL   MCH 36.3 (H) 26.0 - 34.0 pg   MCHC 33.2 32.0 - 36.0 g/dL   RDW 14.9 (H) 11.5 - 14.5 %   Platelets 153 150 - 440 K/uL  Protime-INR upon arrival     Status: None   Collection Time: 05/01/15  8:06 AM  Result Value Ref Range   Prothrombin  Time 14.8 11.4 - 15.0 seconds   INR 1.14   Differential     Status: Abnormal   Collection Time: 05/01/15  8:06 AM  Result Value Ref Range   Neutrophils Relative % 73 %   Neutro Abs 3.1 1.4 - 6.5 K/uL   Lymphocytes Relative 6 %   Lymphs Abs 0.2 (L) 1.0 - 3.6 K/uL   Monocytes Relative 12 %   Monocytes Absolute 0.5 0.2 - 1.0 K/uL   Eosinophils Relative 8 %   Eosinophils Absolute 0.4 0 - 0.7 K/uL   Basophils Relative 1 %   Basophils Absolute 0.0 0 - 0.1 K/uL   No results found.  Review of Systems  Constitutional: Negative for fever, chills and malaise/fatigue.  Respiratory: Negative for cough and shortness of breath.   Cardiovascular: Negative for chest pain.  Gastrointestinal: Negative for abdominal pain.  Genitourinary: Negative for flank pain.  Neurological: Negative for weakness.    Blood pressure 129/64, pulse 54, temperature 98.9 F (37.2 C), temperature source Oral, resp. rate 18,  height 5\' 8"  (1.727 m), weight 160 lb (72.576 kg), SpO2 92 %. Physical Exam  Constitutional: He is oriented to person, place, and time. He appears well-developed and well-nourished. No distress.  Neck:  Airway II   Cardiovascular: Normal rate and regular rhythm.  Exam reveals no friction rub.   No murmur heard. Respiratory: Effort normal and breath sounds normal. No respiratory distress. He has no rales.  GI: Soft. Bowel sounds are normal. He exhibits no distension.  Neurological: He is alert and oriented to person, place, and time.  Skin: Skin is warm and dry. He is not diaphoretic. No erythema.  Psychiatric: He has a normal mood and affect. His behavior is normal.     Assessment/Plan Plan for CT guided BM asp and core bx.  Pt. Request local anesthesia only.  Consent obtained.  SHICK, [place] 05/01/2015, 8:34 AM

## 2015-05-01 NOTE — Progress Notes (Signed)
3attempted IV sticks-unable to access. Dr. Annamaria Boots notified.  Per Dr. Juleen Starr patient requests no sedation, may do without IV.

## 2015-05-09 ENCOUNTER — Encounter: Payer: Self-pay | Admitting: Emergency Medicine

## 2015-05-09 ENCOUNTER — Emergency Department: Payer: Medicare PPO

## 2015-05-09 ENCOUNTER — Emergency Department
Admission: EM | Admit: 2015-05-09 | Discharge: 2015-05-10 | Disposition: A | Discharge status: Home / Self Care | Payer: Medicare PPO | Attending: Emergency Medicine | Admitting: Emergency Medicine

## 2015-05-09 DIAGNOSIS — N183 Chronic kidney disease, stage 3 (moderate): Secondary | ICD-10-CM | POA: Insufficient documentation

## 2015-05-09 DIAGNOSIS — I129 Hypertensive chronic kidney disease with stage 1 through stage 4 chronic kidney disease, or unspecified chronic kidney disease: Secondary | ICD-10-CM | POA: Diagnosis not present

## 2015-05-09 DIAGNOSIS — R079 Chest pain, unspecified: Secondary | ICD-10-CM

## 2015-05-09 DIAGNOSIS — Z79899 Other long term (current) drug therapy: Secondary | ICD-10-CM | POA: Insufficient documentation

## 2015-05-09 DIAGNOSIS — I2510 Atherosclerotic heart disease of native coronary artery without angina pectoris: Secondary | ICD-10-CM | POA: Insufficient documentation

## 2015-05-09 DIAGNOSIS — M79602 Pain in left arm: Secondary | ICD-10-CM | POA: Insufficient documentation

## 2015-05-09 DIAGNOSIS — Z792 Long term (current) use of antibiotics: Secondary | ICD-10-CM | POA: Insufficient documentation

## 2015-05-09 DIAGNOSIS — Z7982 Long term (current) use of aspirin: Secondary | ICD-10-CM | POA: Diagnosis not present

## 2015-05-09 LAB — CBC
HCT: 30.1 % — ABNORMAL LOW (ref 40.0–52.0)
Hemoglobin: 9.9 g/dL — ABNORMAL LOW (ref 13.0–18.0)
MCH: 35.6 pg — AB (ref 26.0–34.0)
MCHC: 32.9 g/dL (ref 32.0–36.0)
MCV: 108.3 fL — AB (ref 80.0–100.0)
PLATELETS: 146 10*3/uL — AB (ref 150–440)
RBC: 2.78 MIL/uL — AB (ref 4.40–5.90)
RDW: 14.3 % (ref 11.5–14.5)
WBC: 4.8 10*3/uL (ref 3.8–10.6)

## 2015-05-09 LAB — BASIC METABOLIC PANEL
Anion gap: 5 (ref 5–15)
BUN: 12 mg/dL (ref 6–20)
CALCIUM: 8.9 mg/dL (ref 8.9–10.3)
CO2: 26 mmol/L (ref 22–32)
CREATININE: 1.23 mg/dL (ref 0.61–1.24)
Chloride: 108 mmol/L (ref 101–111)
GFR calc Af Amer: >60 mL/min (ref >60)
GFR, EST NON AFRICAN AMERICAN: 54 mL/min — AB (ref >60)
GLUCOSE: 95 mg/dL (ref 65–99)
POTASSIUM: 3.6 mmol/L (ref 3.5–5.1)
SODIUM: 139 mmol/L (ref 135–145)

## 2015-05-09 LAB — TROPONIN I

## 2015-05-09 NOTE — ED Notes (Signed)
Lav sent to lab

## 2015-05-09 NOTE — ED Notes (Addendum)
Patient ambulatory to triage with steady gait, without difficulty or distress noted; pt reports feeling tired several days; c/o mid chest tightness and ache to left arm; denies hx of same; denies any accomp symptoms; hx leukemia, bone marrow asp last Monday to evaluate; has appt in morning with Dr [name] for results

## 2015-05-09 NOTE — ED Provider Notes (Signed)
[facility] Emergency Department Provider Note    ____________________________________________  Time seen: 2330  I have reviewed the triage vital signs and the nursing notes.   HISTORY  Chief Complaint Chest Pain   History limited by: Not Limited   HPI Frank Mcdaniel is a 81 y.o. male who presents to the emergency department today with concerns for chest discomfort and arm discomfort. Patient states that this occurred roughly 4-1/2 hours prior to my evaluation. He states he been working outside throughout the day. He came home and sat down. Shortly thereafter he felt a chest discomfort and pointing. He states he also felt some left arm discomfort. He states both of these symptoms lasted roughly 1-2 minutes. He denied any associated shortness of breath or diaphoresis. He states he was recently worked up from a cardiac standpoint a few months ago. States that his cardiologist told him everything looked good. Denies any recent fevers.  Past Medical History  Diagnosis Date  . Coronary artery disease     a. 2000 s/p CABG;  b. 10/2008 Neg MV, EF 64%.  [name] BPH (benign prostatic hyperplasia)   . Hyperlipidemia   . Leukemia ([place])   . H/O echocardiogram     a. 10/2005 Echo: nl EF.  [name] Atypical chest pain     a. 08/2014  . Asymptomatic Sinus Bradycardia   . Asthma   . Hypertension   . GERD (gastroesophageal reflux disease)   . HTN (hypertension)   . CKD (chronic kidney disease), stage III   . COPD (chronic obstructive pulmonary disease) ([place])   . Shortness of breath dyspnea   . Dysrhythmia     Patient Active Problem List   Diagnosis Date Noted  . Hypoxia 02/01/2015  . Asthma 01/28/2015  . Acute respiratory failure with hypoxemia ([place]) 01/28/2015  . BPH (benign prostatic hyperplasia) 01/28/2015  . Hairy cell leukemia ([place]) 01/28/2015  . Coronary artery disease   . Chest pain 09/02/2014  . S/P CABG (coronary artery bypass graft) 11/08/2010  . HTN  (hypertension) 11/08/2010  . Bradycardia 11/08/2010  . Hyperlipidemia 10/27/2009    Past Surgical History  Procedure Laterality Date  . Coronary artery bypass graft  2000  . Cataract extraction      Current Outpatient Rx  Name  Route  Sig  Dispense  Refill  . acyclovir (ZOVIRAX) 200 MG capsule   Oral   Take 200 mg by mouth 5 (five) times daily.         [name] aspirin 81 MG EC tablet   Oral   Take 2 tablets (162 mg total) by mouth daily.   30 tablet   0   . atorvastatin (LIPITOR) 40 MG tablet   Oral   Take 20 mg by mouth daily.          . dapsone 100 MG tablet      TAKE 1 TABLET BY MOUTH EVERY DAY   30 tablet   3   . Multiple Vitamin (MULTIVITAMIN) tablet   Oral   Take 1 tablet by mouth daily.           . pantoprazole (PROTONIX) 40 MG tablet   Oral   Take 40 mg by mouth daily.         . prochlorperazine (COMPAZINE) 10 MG tablet   Oral   Take 1 tablet (10 mg total) by mouth every 6 (six) hours as needed for nausea or vomiting.   30 tablet   0   .  ranitidine (ZANTAC) 150 MG capsule   Oral   Take 150 mg by mouth 2 (two) times daily as needed for heartburn.          . sulfamethoxazole-trimethoprim (BACTRIM DS,SEPTRA DS) 800-160 MG per tablet   Oral   Take 1 tablet by mouth 3 (three) times a week.   30 tablet   3   . tamsulosin (FLOMAX) 0.4 MG CAPS capsule   Oral   Take 0.4 mg by mouth daily.         [name] tiotropium (SPIRIVA) 18 MCG inhalation capsule   Inhalation   Place 1 capsule (18 mcg total) into inhaler and inhale daily.   30 capsule   12     Allergies Review of patient's allergies indicates no known allergies.  Family History  Problem Relation Age of Onset  . Other      no premature CAD.    Social History Social History  Substance Use Topics  . Smoking status: Never Smoker   . Smokeless tobacco: Never Used  . Alcohol Use: No    Review of Systems  Constitutional: Negative for fever. Cardiovascular: Positive for chest  discomfort Respiratory: Negative for shortness of breath. Gastrointestinal: Negative for abdominal pain, vomiting and diarrhea. Genitourinary: Negative for dysuria. Musculoskeletal: Negative for back pain. Skin: Negative for rash. Neurological: Negative for headaches, focal weakness or numbness.   10-point ROS otherwise negative.  ____________________________________________   PHYSICAL EXAM:  VITAL SIGNS: ED Triage Vitals  Enc Vitals Group     BP 05/09/15 2024 159/55 mmHg     Pulse Rate 05/09/15 2024 58     Resp 05/09/15 2024 18     Temp 05/09/15 2024 98.2 F (36.8 C)     Temp Source 05/09/15 2024 Oral     SpO2 05/09/15 2024 93 %     Weight 05/09/15 2024 160 lb (72.576 kg)     Height 05/09/15 2024 5\' 8"  (1.727 m)     Head Cir --      Peak Flow --      Pain Score 05/09/15 2021 2   Constitutional: Alert and oriented. Well appearing and in no distress. Eyes: Conjunctivae are normal. PERRL. Normal extraocular movements. ENT   Head: Normocephalic and atraumatic.   Nose: No congestion/rhinnorhea.   Mouth/Throat: Mucous membranes are moist.   Neck: No stridor. Hematological/Lymphatic/Immunilogical: No cervical lymphadenopathy. Cardiovascular: Normal rate, regular rhythm.  No murmurs, rubs, or gallops. Respiratory: Normal respiratory effort without tachypnea nor retractions. Breath sounds are clear and equal bilaterally. No wheezes/rales/rhonchi. Gastrointestinal: Soft and nontender. No distention. There is no CVA tenderness. Genitourinary: Deferred Musculoskeletal: Normal range of motion in all extremities. No joint effusions.  No lower extremity tenderness nor edema. Neurologic:  Normal speech and language. No gross focal neurologic deficits are appreciated. Speech is normal.  Skin:  Skin is warm, dry and intact. No rash noted. Psychiatric: Mood and affect are normal. Speech and behavior are normal. Patient exhibits appropriate insight and  judgment.  ____________________________________________    LABS (pertinent positives/negatives)  Labs Reviewed  CBC - Abnormal; Notable for the following:    RBC 2.78 (*)    Hemoglobin 9.9 (*)    HCT 30.1 (*)    MCV 108.3 (*)    MCH 35.6 (*)    Platelets 146 (*)    All other components within normal limits  BASIC METABOLIC PANEL - Abnormal; Notable for the following:    GFR calc non Af Amer 54 (*)    All  other components within normal limits  TROPONIN I     ____________________________________________   EKG  I, Nance Pear, attending physician, personally viewed and interpreted this EKG  EKG Time: 2025 Rate: 59 Rhythm: sinus bradycardia Axis: normal Intervals: qtc 459 QRS: RBBB ST changes: no st elevation Impression: abnormal EKG  ____________________________________________    RADIOLOGY  CXR  IMPRESSION: No active cardiopulmonary disease.  I, Decklan Mau, personally viewed and evaluated these images (plain radiographs) as part of my medical decision making. ____________________________________________   PROCEDURES  Procedure(s) performed: None  Critical Care performed: No  ____________________________________________   INITIAL IMPRESSION / ASSESSMENT AND PLAN / ED COURSE  Pertinent labs & imaging results that were available during my care of the patient were reviewed by me and considered in my medical decision making (see chart for details).  Patient presented to the emergency department today after a brief episode of chest discomfort. 2 sets of troponin were negative. EKG without concerning findings. Discussed with patient importance of following up with cardiology.  ____________________________________________   FINAL CLINICAL IMPRESSION(S) / ED DIAGNOSES  Final diagnoses:  Chest pain, unspecified chest pain type     Nance Pear, MD 05/10/15 2072

## 2015-05-10 ENCOUNTER — Inpatient Hospital Stay: Payer: Medicare PPO

## 2015-05-10 ENCOUNTER — Inpatient Hospital Stay: Payer: Medicare PPO | Attending: Oncology | Admitting: Oncology

## 2015-05-10 DIAGNOSIS — R0789 Other chest pain: Secondary | ICD-10-CM | POA: Diagnosis not present

## 2015-05-10 DIAGNOSIS — K219 Gastro-esophageal reflux disease without esophagitis: Secondary | ICD-10-CM | POA: Insufficient documentation

## 2015-05-10 DIAGNOSIS — I129 Hypertensive chronic kidney disease with stage 1 through stage 4 chronic kidney disease, or unspecified chronic kidney disease: Secondary | ICD-10-CM | POA: Insufficient documentation

## 2015-05-10 DIAGNOSIS — R531 Weakness: Secondary | ICD-10-CM | POA: Diagnosis not present

## 2015-05-10 DIAGNOSIS — N183 Chronic kidney disease, stage 3 (moderate): Secondary | ICD-10-CM

## 2015-05-10 DIAGNOSIS — J449 Chronic obstructive pulmonary disease, unspecified: Secondary | ICD-10-CM | POA: Diagnosis not present

## 2015-05-10 DIAGNOSIS — R0609 Other forms of dyspnea: Secondary | ICD-10-CM

## 2015-05-10 DIAGNOSIS — E785 Hyperlipidemia, unspecified: Secondary | ICD-10-CM | POA: Insufficient documentation

## 2015-05-10 DIAGNOSIS — I2510 Atherosclerotic heart disease of native coronary artery without angina pectoris: Secondary | ICD-10-CM | POA: Diagnosis not present

## 2015-05-10 DIAGNOSIS — Z79899 Other long term (current) drug therapy: Secondary | ICD-10-CM | POA: Diagnosis not present

## 2015-05-10 DIAGNOSIS — Z951 Presence of aortocoronary bypass graft: Secondary | ICD-10-CM | POA: Insufficient documentation

## 2015-05-10 DIAGNOSIS — Z7982 Long term (current) use of aspirin: Secondary | ICD-10-CM | POA: Insufficient documentation

## 2015-05-10 DIAGNOSIS — R5381 Other malaise: Secondary | ICD-10-CM | POA: Diagnosis not present

## 2015-05-10 DIAGNOSIS — R5383 Other fatigue: Secondary | ICD-10-CM | POA: Diagnosis not present

## 2015-05-10 DIAGNOSIS — C9141 Hairy cell leukemia, in remission: Secondary | ICD-10-CM | POA: Diagnosis not present

## 2015-05-10 DIAGNOSIS — C9140 Hairy cell leukemia not having achieved remission: Secondary | ICD-10-CM | POA: Diagnosis not present

## 2015-05-10 LAB — CBC
HEMATOCRIT: 31.8 % — AB (ref 40.0–52.0)
HEMOGLOBIN: 10.4 g/dL — AB (ref 13.0–18.0)
MCH: 35.1 pg — AB (ref 26.0–34.0)
MCHC: 32.8 g/dL (ref 32.0–36.0)
MCV: 107.3 fL — ABNORMAL HIGH (ref 80.0–100.0)
Platelets: 162 10*3/uL (ref 150–440)
RBC: 2.97 MIL/uL — AB (ref 4.40–5.90)
RDW: 14.4 % (ref 11.5–14.5)
WBC: 4.8 10*3/uL (ref 3.8–10.6)

## 2015-05-10 LAB — IRON AND TIBC
IRON: 66 ug/dL (ref 45–182)
SATURATION RATIOS: 27 % (ref 17.9–39.5)
TIBC: 242 ug/dL — ABNORMAL LOW (ref 250–450)
UIBC: 176 ug/dL

## 2015-05-10 LAB — VITAMIN B12: Vitamin B-12: 418 pg/mL (ref 180–914)

## 2015-05-10 LAB — LACTATE DEHYDROGENASE: LDH: 193 U/L — AB (ref 98–192)

## 2015-05-10 LAB — TROPONIN I: Troponin I: <0.03 ng/mL (ref <0.031)

## 2015-05-10 LAB — FOLATE: Folate: 43.2 ng/mL (ref >5.9)

## 2015-05-10 LAB — TSH: TSH: 1.247 u[IU]/mL (ref 0.350–4.500)

## 2015-05-10 LAB — FERRITIN: Ferritin: 340 ng/mL — ABNORMAL HIGH (ref 24–336)

## 2015-05-10 NOTE — Progress Notes (Signed)
Patient had an episode of chest pain yesterday and went to the ER where they ruled out any cardiac issues.  His main concern today is the extreme fatigue he continues to have.

## 2015-05-10 NOTE — Discharge Instructions (Signed)
Please seek medical attention for any high fevers, chest pain, shortness of breath, change in behavior, persistent vomiting, bloody stool or any other new or concerning symptoms. ° ° °Nonspecific Chest Pain  °Chest pain can be caused by many different conditions. There is always a chance that your pain could be related to something serious, such as a heart attack or a blood clot in your lungs. Chest pain can also be caused by conditions that are not life-threatening. If you have chest pain, it is very important to follow up with your health care provider. °CAUSES  °Chest pain can be caused by: °· Heartburn. °· Pneumonia or bronchitis. °· Anxiety or stress. °· Inflammation around your heart (pericarditis) or lung (pleuritis or pleurisy). °· A blood clot in your lung. °· A collapsed lung (pneumothorax). It can develop suddenly on its own (spontaneous pneumothorax) or from trauma to the chest. °· Shingles infection (varicella-zoster virus). °· Heart attack. °· Damage to the bones, muscles, and cartilage that make up your chest wall. This can include: °¨ Bruised bones due to injury. °¨ Strained muscles or cartilage due to frequent or repeated coughing or overwork. °¨ Fracture to one or more ribs. °¨ Sore cartilage due to inflammation (costochondritis). °RISK FACTORS  °Risk factors for chest pain may include: °· Activities that increase your risk for trauma or injury to your chest. °· Respiratory infections or conditions that cause frequent coughing. °· Medical conditions or overeating that can cause heartburn. °· Heart disease or family history of heart disease. °· Conditions or health behaviors that increase your risk of developing a blood clot. °· Having had chicken pox (varicella zoster). °SIGNS AND SYMPTOMS °Chest pain can feel like: °· Burning or tingling on the surface of your chest or deep in your chest. °· Crushing, pressure, aching, or squeezing pain. °· Dull or sharp pain that is worse when you move, cough, or  take a deep breath. °· Pain that is also felt in your back, neck, shoulder, or arm, or pain that spreads to any of these areas. °Your chest pain may come and go, or it may stay constant. °DIAGNOSIS °Lab tests or other studies may be needed to find the cause of your pain. Your health care provider may have you take a test called an ambulatory ECG (electrocardiogram). An ECG records your heartbeat patterns at the time the test is performed. You may also have other tests, such as: °· Transthoracic echocardiogram (TTE). During echocardiography, sound waves are used to create a picture of all of the heart structures and to look at how blood flows through your heart. °· Transesophageal echocardiogram (TEE). This is a more advanced imaging test that obtains images from inside your body. It allows your health care provider to see your heart in finer detail. °· Cardiac monitoring. This allows your health care provider to monitor your heart rate and rhythm in real time. °· Holter monitor. This is a portable device that records your heartbeat and can help to diagnose abnormal heartbeats. It allows your health care provider to track your heart activity for several days, if needed. °· Stress tests. These can be done through exercise or by taking medicine that makes your heart beat more quickly. °· Blood tests. °· Imaging tests. °TREATMENT  °Your treatment depends on what is causing your chest pain. Treatment may include: °· Medicines. These may include: °¨ Acid blockers for heartburn. °¨ Anti-inflammatory medicine. °¨ Pain medicine for inflammatory conditions. °¨ Antibiotic medicine, if an infection is present. °¨ Medicines   to dissolve blood clots. °¨ Medicines to treat coronary artery disease. °· Supportive care for conditions that do not require medicines. This may include: °¨ Resting. °¨ Applying heat or cold packs to injured areas. °¨ Limiting activities until pain decreases. °HOME CARE INSTRUCTIONS °· If you were prescribed  an antibiotic medicine, finish it all even if you start to feel better. °· Avoid any activities that bring on chest pain. °· Do not use any tobacco products, including cigarettes, chewing tobacco, or electronic cigarettes. If you need help quitting, ask your health care provider. °· Do not drink alcohol. °· Take medicines only as directed by your health care provider. °· Keep all follow-up visits as directed by your health care provider. This is important. This includes any further testing if your chest pain does not go away. °· If heartburn is the cause for your chest pain, you may be told to keep your head raised (elevated) while sleeping. This reduces the chance that acid will go from your stomach into your esophagus. °· Make lifestyle changes as directed by your health care provider. These may include: °¨ Getting regular exercise. Ask your health care provider to suggest some activities that are safe for you. °¨ Eating a heart-healthy diet. A registered dietitian can help you to learn healthy eating options. °¨ Maintaining a healthy weight. °¨ Managing diabetes, if necessary. °¨ Reducing stress. °SEEK MEDICAL CARE IF: °· Your chest pain does not go away after treatment. °· You have a rash with blisters on your chest. °· You have a fever. °SEEK IMMEDIATE MEDICAL CARE IF:  °· Your chest pain is worse. °· You have an increasing cough, or you cough up blood. °· You have severe abdominal pain. °· You have severe weakness. °· You faint. °· You have chills. °· You have sudden, unexplained chest discomfort. °· You have sudden, unexplained discomfort in your arms, back, neck, or jaw. °· You have shortness of breath at any time. °· You suddenly start to sweat, or your skin gets clammy. °· You feel nauseous or you vomit. °· You suddenly feel light-headed or dizzy. °· Your heart begins to beat quickly, or it feels like it is skipping beats. °These symptoms may represent a serious problem that is an emergency. Do not wait to  see if the symptoms will go away. Get medical help right away. Call your local emergency services (911 in the [place] Do not drive yourself to the hospital. °  °This information is not intended to replace advice given to you by your health care provider. Make sure you discuss any questions you have with your health care provider. °  °Document Released: 05/01/2005 Document Revised: 08/12/2014 Document Reviewed: 02/25/2014 °[employer] Interactive Patient Education ©2016 [employer] ° °

## 2015-05-11 ENCOUNTER — Ambulatory Visit (INDEPENDENT_AMBULATORY_CARE_PROVIDER_SITE_OTHER): Payer: Medicare PPO | Admitting: Physician Assistant

## 2015-05-11 ENCOUNTER — Telehealth: Payer: Self-pay

## 2015-05-11 ENCOUNTER — Encounter: Payer: Self-pay | Admitting: Physician Assistant

## 2015-05-11 VITALS — BP 140/58 | HR 61 | Ht 68.0 in | Wt 165.8 lb

## 2015-05-11 DIAGNOSIS — E785 Hyperlipidemia, unspecified: Secondary | ICD-10-CM

## 2015-05-11 DIAGNOSIS — I10 Essential (primary) hypertension: Secondary | ICD-10-CM

## 2015-05-11 DIAGNOSIS — Z01812 Encounter for preprocedural laboratory examination: Secondary | ICD-10-CM | POA: Diagnosis not present

## 2015-05-11 DIAGNOSIS — C9140 Hairy cell leukemia not having achieved remission: Secondary | ICD-10-CM

## 2015-05-11 DIAGNOSIS — R079 Chest pain, unspecified: Secondary | ICD-10-CM

## 2015-05-11 DIAGNOSIS — I25110 Atherosclerotic heart disease of native coronary artery with unstable angina pectoris: Secondary | ICD-10-CM | POA: Diagnosis not present

## 2015-05-11 LAB — T4: T4, Total: 9.6 ug/dL (ref 4.5–12.0)

## 2015-05-11 LAB — HAPTOGLOBIN: HAPTOGLOBIN: 13 mg/dL — AB (ref 34–200)

## 2015-05-11 MED ORDER — ISOSORBIDE MONONITRATE ER 30 MG PO TB24: 15.0000 mg | ORAL_TABLET | Freq: Every day | ORAL | Status: DC

## 2015-05-11 NOTE — Progress Notes (Signed)
Cardiology Office Note:  Date of Encounter: 05/11/2015  ID: Frank Mcdaniel, DOB October 14, 1932, MRN [id number]  PCP:  Morton Peters, MD Primary Cardiologist:  Dr. Rockey Situ, MD  Chief Complaint  Patient presents with  . other    F/u ED c/o chest discomfort radiates left arm. Meds reveiwed verbally with pt.    HPI:  81 year old male with history of CAD s/p CABG in 2000, hairy cell leukemia followed closely by oncology, prior history of renal insufficiency, chronic LE edema, HLD, asthma as achild, BPH, prior chronic respiratory failure felt to be 2/2 Dapsone now off oxygen therapy, and chronic chest pain who presents to the office today after recently being seen in the ED on 10/4 for chest pain.   Since the summer of 2015, he has been experiencing frequent nausea and queasiness. He was evaluated by GI in 04/2014 w/ EGD and was placed on prilosec. This seemed to help his queasiness some, but he has continued to have a fair amount of nausea on almost a daily basis. Despite this, his appetite had been good and he had not had any change in his PO intake. Further, he has never had vomiting, heartburn/gerd Ss, constipation, or diarrhea. No melena or brbpr. In May 2016, he was seen by GI again 2/2 ongoing nausea, and his PPI was switched to protonix and zantac was added in the PM. He thinks that the addition of zantac has helped some but he continued to have a low level of nausea daily.   He presented to [facility] ED on 6/9 with with abrupt weakness while pressure washing on his patio and moving patio furniture 3 weeks prior to admission. He did not have c/p or dyspnea. ECG was non-acute and trop was neg. He noted brieg 1/10 left sided chest pain. He was d/c'd home from ED and was scheduled to follow up with cardiology as an outpatient. Unfortunately, when he woke up on 6/10 he felt very weak and nauseated with some abd cramping. This was worse than usual and so he presented back to the  ED for eval. Again, ecg was non-acute and CE neg. He again noted a brief episode of 1/10 "sensation" in his left upper chest and also a brief period of dyspnea while in the ED. He was admitted for further eval. HE underwent nuclear stress test on 6/10 that was normal, and low risk, normal LV EF at 55-65%. CTA chest ruled out PE. Echo showed EF 55-60%, no RWMA, normal LV diastolic function, normal PASP. He as noted to be mildly hypoxic and discharged home on oxygen. If his hypoxia persisted a pulmonary evaluation was advised. He did reportedly undergo PFT in July, though these results are not available in Epic or Care Everywhere.   He presented to [facility] ED on 10/4 with chest pain with associated arm pain. Cardiac enzymes were negative x 2. CXR showed no acute cardiopulmonary diseae. ECG sinus bradycardia, 59 bpm, RBBB (old). HGB 9.9, K+ 3.6 with possible hemolysis.   He reports 1-2/10 centralized "chest burning" that is both exertional and nonexertional. Chest burning sensation is worse when he exerts himself. There is associated radiation down his left arm. No associated SOB, diaphoresis, palpitations, nausea, vomiting, presyncope, or syncope. He last had the chest pain on 10/4. He has not had a cardiac cath since his bypass surgery in 2000. He also notes continued feelings of generalized fatigue and lack of energy. He recently underwent bone marrow biopsy at the end of  September that showed no relapse of his leukemia. He has continued to have persisting GI "upset." He does not describe this as reflux, just that his stomach does not like him. Symptoms have been present since the summer of 2015 and have not improved with multiple antacids, H2 blockers, PPI's, or GI evaluations. Symptoms are not worse with or without food. No change in bowel habits. His chronic O2 therapy ahs been discontinued by pulmonary as it was felt this was secondary to his Dapsone. He underwent PFTs that were above normal levels and ABG  testing that showed oxygen saturations of 94.1%. He does not feel SOB without his O2, and he never did feel SOB. He is currently without chest pain.     Past Medical History  Diagnosis Date  . Coronary artery disease     a. 2000 s/p CABG;  b. 10/2008 Neg MV, EF 64%.  [name] BPH (benign prostatic hyperplasia)   . Hyperlipidemia   . Leukemia ([place])   . H/O echocardiogram     a. 10/2005 Echo: nl EF.  [name] Atypical chest pain     a. 08/2014  . Asymptomatic Sinus Bradycardia   . Asthma   . Hypertension   . GERD (gastroesophageal reflux disease)   . HTN (hypertension)   . CKD (chronic kidney disease), stage III   . COPD (chronic obstructive pulmonary disease) ([employer])   . Shortness of breath dyspnea   . Dysrhythmia   :  Past Surgical History  Procedure Laterality Date  . Coronary artery bypass graft  2000  . Cataract extraction    :  Social History:  The patient  reports that he has never smoked. He has never used smokeless tobacco. He reports that he does not drink alcohol or use illicit drugs.   Family History  Problem Relation Age of Onset  . Other      no premature CAD.     Allergies:  No Known Allergies   Home Medications:  Current Outpatient Prescriptions  Medication Sig Dispense Refill  . acyclovir (ZOVIRAX) 200 MG capsule Take 200 mg by mouth 5 (five) times daily.    [name] aspirin 81 MG EC tablet Take 2 tablets (162 mg total) by mouth daily. 30 tablet 0  . atorvastatin (LIPITOR) 40 MG tablet Take 20 mg by mouth daily.     . dapsone 100 MG tablet TAKE 1 TABLET BY MOUTH EVERY DAY 30 tablet 3  . Multiple Vitamin (MULTIVITAMIN) tablet Take 1 tablet by mouth daily.      . pantoprazole (PROTONIX) 40 MG tablet Take 40 mg by mouth 2 (two) times daily.     . prochlorperazine (COMPAZINE) 10 MG tablet Take 1 tablet (10 mg total) by mouth every 6 (six) hours as needed for nausea or vomiting. 30 tablet 0  . tamsulosin (FLOMAX) 0.4 MG CAPS capsule Take 0.4 mg by mouth daily.    . isosorbide  mononitrate (IMDUR) 30 MG 24 hr tablet Take 0.5 tablets (15 mg total) by mouth daily. 30 tablet 3   No current facility-administered medications for this visit.   Facility-Administered Medications Ordered in Other Visits  Medication Dose Route Frequency Provider Last Rate Last Dose  . sodium chloride 0.9 % injection 10 mL  10 mL Intracatheter PRN [name], MD      . sodium chloride 0.9 % injection 10 mL  10 mL Intracatheter PRN [name], MD         Review of Systems:  Review of  Systems  Constitutional: Positive for malaise/fatigue. Negative for fever, chills, weight loss and diaphoresis.  HENT: Negative for congestion.   Eyes: Negative for discharge and redness.  Respiratory: Negative for cough, hemoptysis, sputum production, shortness of breath and wheezing.   Cardiovascular: Positive for chest pain. Negative for palpitations, orthopnea, claudication, leg swelling and PND.       1-2/10 chest pain burning  Gastrointestinal: Positive for heartburn. Negative for nausea, vomiting, blood in stool and melena.  Genitourinary: Negative for hematuria.  Musculoskeletal: Negative for myalgias and falls.  Skin: Negative for rash.  Neurological: Positive for weakness. Negative for dizziness, tingling, tremors, sensory change, speech change, focal weakness and loss of consciousness.  Endo/Heme/Allergies: Does not bruise/bleed easily.  Psychiatric/Behavioral: Negative for substance abuse. The patient is not nervous/anxious.   All other systems reviewed and are negative.    Physical Exam:  Blood pressure 140/58, pulse 61, height 5' 8"  (1.727 m), weight 165 lb 12 oz (75.184 kg). BMI: Body mass index is 25.21 kg/(m^2). General: Pleasant, NAD. Psych: Normal affect. Responds to questions with normal affect.  Neuro: Alert and oriented X 3. Moves all extremities spontaneously. HEENT: Normocephalic, atraumatic. EOM intact. Sclera anicteric.  Neck: Trachea midline. Supple without  bruits or JVD. Lungs:  Respirations regular and unlabored. CTA bilaterally without wheezing, crackles, or rhonchi.  Heart: RRR, normal s3, s4. II/VI systolic murmurs. No rubs, or gallops.  Abdomen: Soft, non-tender, non-distended, BS + x 4.  Extremities: No clubbing, cyanosis or edema. DP/PT/Radials 2+ and equal bilaterally.   Accessory Clinical Findings:  EKG: NSR, 61 bpm, RBBB, no significant st/t changes   Recent Labs: 01/12/2015: B Natriuretic Peptide 115.0* 01/29/2015: Magnesium 2.2 04/18/2015: ALT 8* 05/09/2015: BUN 12; Creatinine, Ser 1.23; Potassium 3.6; Sodium 139 05/10/2015: Hemoglobin 10.4*; Platelets 162; TSH 1.247  01/13/2015: Cholesterol 85; HDL 36*; LDL Cholesterol 37; Total CHOL/HDL Ratio 2.4; Triglycerides 62; VLDL 12  Estimated Creatinine Clearance: 46.3 mL/min (by C-G formula based on Cr of 1.23).  Weights: Wt Readings from Last 3 Encounters:  05/11/15 165 lb 12 oz (75.184 kg)  05/09/15 160 lb (72.576 kg)  05/01/15 160 lb (72.576 kg)    Other studies Reviewed: Additional studies/ records that were reviewed today include: prior office notes and ED note.  Assessment & Plan:  1. Unstable angina/CAD s/p CABG in 2000: -Persisting symptoms of chest pain, ongoing fatigue, and visceral uneasiness in the setting of normal nuclear stress testing in June 2016 -Schedule for cardiac catheterization as he has not had a cardiac cath since his cardiac bypass in 2000 -Perhaps his GI complaints are secondary to RCA lesion -Add Imdur 15 mg daily -Continue aspirin 81 mg, and Lipitor 40 mg daily -Risks and benefits of cardiac catheterization have been discussed with the patient including risks of bleeding, bruising, infection, kidney damage, stroke, heart attack, and death. The patient understands these risks and is willing to proceed with the procedure. All questions have been answered and concerns listened to.   2. GI issues: -He does not define these as true reflux -Cardiac cath  as above to evaluate for cardiac ischemia -Continue current GI regimen  -If no cardiac ischemia, follow up with GI  3. History of chronic respiratory failure: -Never with SOB -Decreased pulse ox readings felt to be 2/2 Dapsone interactions with oxygen and hgb binding per pulmonary   -ABG of 94.1% -Now off oxygen, feels fine  4. HTN: -BP has been elevated as of late -Add Imdur as above -Continue to monitor closely  5. HLD: -Lipitor as above     Dispo: -Follow up post cardiac cath  Current medicines are reviewed at length with the patient today.  The patient did not have any concerns regarding medicines.   Christell Faith, PA-C [facility] [address] [place], [place] [address] [phone] [place] 05/11/2015, 3:54 PM

## 2015-05-11 NOTE — Patient Instructions (Addendum)
Medication Instructions:  Your physician has recommended you make the following change in your medication:  START taking Imdur 15mg  once per day   Labwork: CBC, PT/INR, BMET  Testing/Procedures: Your physician has requested that you have a cardiac catheterization. Cardiac catheterization is used to diagnose and/or treat various heart conditions. Doctors may recommend this procedure for a number of different reasons. The most common reason is to evaluate chest pain. Chest pain can be a symptom of coronary artery disease (CAD), and cardiac catheterization can show whether plaque is narrowing or blocking your heart's arteries. This procedure is also used to evaluate the valves, as well as measure the blood flow and oxygen levels in different parts of your heart. For further information please visit [email or web address]. Please follow instruction sheet, as given.  [facility] Cardiac Cath Instructions   You are scheduled for a Cardiac Cath on: Friday, October 14, 8:30am  Please arrive at 7:30am on the day of your procedure  Do not eat/drink anything after midnight  Someone will need to drive you home  It is recommended someone be with you for the first 24 hours after your procedure  Wear clothes that are easy to get on/off and wear slip on shoes if possible   Medications bring a current list of all medications with you  __xx_ You may take all of your medications the morning of your procedure with enough water to swallow safely     Day of your procedure: Arrive at the [facility] entrance.  Free valet service is available.  After entering the [facility] please check-in at the registration desk (1st desk on your right) to receive your armband. After receiving your armband someone will escort you to the cardiac cath/special procedures waiting area.  The usual length of stay after your procedure is about 2 to 3 hours.  This can vary.  If you have any questions, please call our office at  [phone], or you may call the cardiac cath lab at [facility] directly at [phone]    Follow-Up: Your physician recommends that you schedule a follow-up appointment with Dr. Rockey Situ after your cath.   Any Other Special Instructions Will Be Listed Below (If Applicable).   Angiogram An angiogram, also called angiography, is a procedure used to look at the blood vessels. In this procedure, dye is injected through a long, thin tube (catheter) into an artery. X-rays are then taken. The X-rays will show if there is a blockage or problem in a blood vessel.  LET [facility] CARE PROVIDER KNOW ABOUT:  Any allergies you have, including allergies to shellfish or contrast dye.   All medicines you are taking, including vitamins, herbs, eye drops, creams, and over-the-counter medicines.   Previous problems you or members of your family have had with the use of anesthetics.   Any blood disorders you have.   Previous surgeries you have had.  Any previous kidney problems or failure you have had.  Medical conditions you have.   Possibility of pregnancy, if this applies. RISKS AND COMPLICATIONS Generally, an angiogram is a safe procedure. However, as with any procedure, problems can occur. Possible problems include:  Injury to the blood vessels, including rupture or bleeding.  Infection or bruising at the catheter site.  Allergic reaction to the dye or contrast used.  Kidney damage from the dye or contrast used.  Blood clots that can lead to a stroke or heart attack. BEFORE THE PROCEDURE  Do not eat or drink after midnight on the night  before the procedure, or as directed by your health care provider.   Ask your health care provider if you may drink enough water to take any needed medicines the morning of the procedure.  PROCEDURE  You may be given a medicine to help you relax (sedative) before and during the procedure. This medicine is given through an IV access tube that is  inserted into one of your veins.   The area where the catheter will be inserted will be washed and shaved. This is usually done in the groin but may be done in the fold of your arm (near your elbow) or in the wrist.  A medicine will be given to numb the area where the catheter will be inserted (local anesthetic).  The catheter will be inserted with a guide wire into an artery. The catheter is guided by using a type of X-ray (fluoroscopy) to the blood vessel being examined.   Dye is then injected into the catheter, and X-rays are taken. The dye helps to show where any narrowing or blockages are located.  AFTER THE PROCEDURE   If the procedure is done through the leg, you will be kept in bed lying flat for several hours. You will be instructed to not bend or cross your legs.  The insertion site will be checked frequently.  The pulse in your feet or wrist will be checked frequently.  Additional blood tests, X-rays, and electrocardiography may be done.   You may need to stay in the hospital overnight for observation.    This information is not intended to replace advice given to you by your health care provider. Make sure you discuss any questions you have with your health care provider.   Document Released: 05/01/2005 Document Revised: 08/12/2014 Document Reviewed: 12/23/2012 Elsevier Interactive Patient Education 2016 [place] After Refer to this sheet in the next few weeks. These instructions provide you with information about caring for yourself after your procedure. Your health care provider may also give you more specific instructions. Your treatment has been planned according to current medical practices, but problems sometimes occur. Call your health care provider if you have any problems or questions after your procedure. WHAT TO EXPECT AFTER THE PROCEDURE After your procedure, it is typical to have the following:  Bruising at the catheter insertion site  that usually fades within 1-2 weeks.  Blood collecting in the tissue (hematoma) that may be painful to the touch. It should usually decrease in size and tenderness within 1-2 weeks. HOME CARE INSTRUCTIONS  Take medicines only as directed by your health care provider.  You may shower 24-48 hours after the procedure or as directed by your health care provider. Remove the bandage (dressing) and gently wash the site with plain soap and water. Pat the area dry with a clean towel. Do not rub the site, because this may cause bleeding.  Do not take baths, swim, or use a hot tub until your health care provider approves.  Check your insertion site every day for redness, swelling, or drainage.  Do not apply powder or lotion to the site.  Do not lift over 10 lb (4.5 kg) for 5 days after your procedure or as directed by your health care provider.  Ask your health care provider when it is okay to:  Return to work or school.  Resume usual physical activities or sports.  Resume sexual activity.  Do not drive home if you are discharged the same day as the procedure.  Have someone else drive you.  You may drive 24 hours after the procedure unless otherwise instructed by your health care provider.  Do not operate machinery or power tools for 24 hours after the procedure or as directed by your health care provider.  If your procedure was done as an outpatient procedure, which means that you went home the same day as your procedure, a responsible adult should be with you for the first 24 hours after you arrive home.  Keep all follow-up visits as directed by your health care provider. This is important. SEEK MEDICAL CARE IF:  You have a fever.  You have chills.  You have increased bleeding from the catheter insertion site. Hold pressure on the site. SEEK IMMEDIATE MEDICAL CARE IF:  You have unusual pain at the catheter insertion site.  You have redness, warmth, or swelling at the catheter  insertion site.  You have drainage (other than a small amount of blood on the dressing) from the catheter insertion site.  The catheter insertion site is bleeding, and the bleeding does not stop after 30 minutes of holding steady pressure on the site.  The area near or just beyond the catheter insertion site becomes pale, cool, tingly, or numb.   This information is not intended to replace advice given to you by your health care provider. Make sure you discuss any questions you have with your health care provider.   Document Released: 02/07/2005 Document Revised: 08/12/2014 Document Reviewed: 12/23/2012 Elsevier Interactive Patient Education [employer].

## 2015-05-12 ENCOUNTER — Telehealth: Payer: Self-pay

## 2015-05-12 LAB — BASIC METABOLIC PANEL
BUN / CREAT RATIO: 8 — AB (ref 10–22)
BUN: 10 mg/dL (ref 8–27)
CO2: 25 mmol/L (ref 18–29)
CREATININE: 1.28 mg/dL — AB (ref 0.76–1.27)
Calcium: 8.9 mg/dL (ref 8.6–10.2)
Chloride: 104 mmol/L (ref 97–108)
GFR calc Af Amer: 61 mL/min/{1.73_m2} (ref >59)
GFR calc non Af Amer: 53 mL/min/{1.73_m2} — ABNORMAL LOW (ref >59)
GLUCOSE: 120 mg/dL — AB (ref 65–99)
Potassium: 3.8 mmol/L (ref 3.5–5.2)
Sodium: 141 mmol/L (ref 134–144)

## 2015-05-12 LAB — CBC
HEMATOCRIT: 29.3 % — AB (ref 37.5–51.0)
HEMOGLOBIN: 9.4 g/dL — AB (ref 12.6–17.7)
MCH: 34.8 pg — ABNORMAL HIGH (ref 26.6–33.0)
MCHC: 32.1 g/dL (ref 31.5–35.7)
MCV: 109 fL — AB (ref 79–97)
Platelets: 172 10*3/uL (ref 150–379)
RBC: 2.7 x10E6/uL — AB (ref 4.14–5.80)
RDW: 14.4 % (ref 12.3–15.4)
WBC: 4.3 10*3/uL (ref 3.4–10.8)

## 2015-05-12 LAB — PROTIME-INR
INR: 1 (ref 0.8–1.2)
Prothrombin Time: 10.8 s (ref 9.1–12.0)

## 2015-05-12 NOTE — Telephone Encounter (Signed)
Per verbal from Dr. Rockey Situ, can schedule cath w/him Oct 18, 12:30 or Dr. Fletcher Anon 10/17 7:30am S/w pt who states Oct 18, 12:30pm is best for him. Pt understands arrival time 11:30am  S/w Bethena Roys in scheduling who changed date. Sent message to Dr. Rockey Situ, [facility] aware

## 2015-05-12 NOTE — Telephone Encounter (Signed)
Erroneous encounter

## 2015-05-22 ENCOUNTER — Other Ambulatory Visit: Payer: Self-pay | Admitting: Cardiovascular Disease

## 2015-05-22 DIAGNOSIS — I209 Angina pectoris, unspecified: Secondary | ICD-10-CM

## 2015-05-23 ENCOUNTER — Encounter
Admission: RE | Disposition: A | Discharge status: Home / Self Care | Payer: Self-pay | Source: Ambulatory Visit | Attending: Cardiovascular Disease

## 2015-05-23 ENCOUNTER — Telehealth: Payer: Self-pay

## 2015-05-23 SURGERY — LEFT HEART CATH AND CORONARY ANGIOGRAPHY
Anesthesia: Moderate Sedation | Laterality: Bilateral

## 2015-05-23 NOTE — Telephone Encounter (Signed)
Spoke w/ pt.  Advised him that his cardiac cath has been resched to Friday, Oct 21 @ 7:30. He understands to arrive @ [facility] @ 6:30 am. Reviewed cath instructions w/ pt.  He verbalizes understanding, though he [place] and [place] have to change his plans.

## 2015-05-25 ENCOUNTER — Other Ambulatory Visit: Payer: Self-pay | Admitting: Cardiovascular Disease

## 2015-05-25 DIAGNOSIS — I209 Angina pectoris, unspecified: Secondary | ICD-10-CM

## 2015-05-25 NOTE — H&P (Signed)
ID: Frank Mcdaniel, DOB [birth year], MRN [id number]  PCP: Morton Peters, MD Primary Cardiologist: Dr. Rockey Situ, MD  Chief Complaint  Patient presents with  . other    F/u ED c/o chest discomfort radiates left arm. Meds reveiwed verbally with pt.    HPI:  81 year old male with history of CAD s/p CABG in 2000, hairy cell leukemia followed closely by oncology, prior history of renal insufficiency, chronic LE edema, HLD, asthma as achild, BPH, prior chronic respiratory failure felt to be 2/2 Dapsone now off oxygen therapy, and chronic chest pain who presents for cardiac cath. seen in the ED on 10/4 for chest pain.   Since the summer of 2015, he has been experiencing frequent nausea and queasiness. He was evaluated by GI in 04/2014 w/ EGD and was placed on prilosec. This seemed to help his queasiness some, but he has continued to have a fair amount of nausea on almost a daily basis. Despite this, his appetite had been good and he had not had any change in his PO intake. Further, he has never had vomiting, heartburn/gerd Ss, constipation, or diarrhea. No melena or brbpr. In May 2016, he was seen by GI again 2/2 ongoing nausea, and his PPI was switched to protonix and zantac was added in the PM. He thinks that the addition of zantac has helped some but he continued to have a low level of nausea daily.   He presented to [facility] ED on 6/9 with with abrupt weakness while pressure washing on his patio and moving patio furniture 3 weeks prior to admission. He did not have c/p or dyspnea. ECG was non-acute and trop was neg. He noted brieg 1/10 left sided chest pain. He was d/c'd home from ED and was scheduled to follow up with cardiology as an outpatient. Unfortunately, when he woke up on 6/10 he felt very weak and nauseated with some abd cramping. This was worse than usual and so he presented back to the ED for eval. Again, ecg was non-acute and CE neg. He again noted  a brief episode of 1/10 "sensation" in his left upper chest and also a brief period of dyspnea while in the ED. He was admitted for further eval. HE underwent nuclear stress test on 6/10 that was normal, and low risk, normal LV EF at 55-65%. CTA chest ruled out PE. Echo showed EF 55-60%, no RWMA, normal LV diastolic function, normal PASP. He as noted to be mildly hypoxic and discharged home on oxygen. If his hypoxia persisted a pulmonary evaluation was advised. He did reportedly undergo PFT in July, though these results are not available in Epic or Care Everywhere.   He presented to [facility] ED on 10/4 with chest pain with associated arm pain. Cardiac enzymes were negative x 2. CXR showed no acute cardiopulmonary diseae. ECG sinus bradycardia, 59 bpm, RBBB (old). HGB 9.9, K+ 3.6 with possible hemolysis.   He reports 1-2/10 centralized "chest burning" that is both exertional and nonexertional. Chest burning sensation is worse when he exerts himself. There is associated radiation down his left arm. No associated SOB, diaphoresis, palpitations, nausea, vomiting, presyncope, or syncope. He last had the chest pain on 10/4. He has not had a cardiac cath since his bypass surgery in 2000. He also notes continued feelings of generalized fatigue and lack of energy. He recently underwent bone marrow biopsy at the end of September that showed no relapse of his leukemia. He has continued to have  persisting GI "upset." He does not describe this as reflux, just that his stomach does not like him. Symptoms have been present since the summer of 2015 and have not improved with multiple antacids, H2 blockers, PPI's, or GI evaluations. Symptoms are not worse with or without food. No change in bowel habits. His chronic O2 therapy ahs been discontinued by pulmonary as it was felt this was secondary to his Dapsone. He underwent PFTs that were above normal levels and ABG testing that showed oxygen saturations of 94.1%. He does not feel  SOB without his O2, and he never did feel SOB. He is currently without chest pain.    Past Medical History  Diagnosis Date  . Coronary artery disease     a. 2000 s/p CABG; b. 10/2008 Neg MV, EF 64%.  [name] BPH (benign prostatic hyperplasia)   . Hyperlipidemia   . Leukemia ([place])   . H/O echocardiogram     a. 10/2005 Echo: nl EF.  [name] Atypical chest pain     a. 08/2014  . Asymptomatic Sinus Bradycardia   . Asthma   . Hypertension   . GERD (gastroesophageal reflux disease)   . HTN (hypertension)   . CKD (chronic kidney disease), stage III   . COPD (chronic obstructive pulmonary disease) ([place])   . Shortness of breath dyspnea   . Dysrhythmia   :  Past Surgical History  Procedure Laterality Date  . Coronary artery bypass graft  2000  . Cataract extraction    :  Social History: The patient  reports that he has never smoked. He has never used smokeless tobacco. He reports that he does not drink alcohol or use illicit drugs.   Family History  Problem Relation Age of Onset  . Other      no premature CAD.     Allergies:  No Known Allergies   Home Medications:  Current Outpatient Prescriptions  Medication Sig Dispense Refill  . acyclovir (ZOVIRAX) 200 MG capsule Take 200 mg by mouth 5 (five) times daily.    [name] aspirin 81 MG EC tablet Take 2 tablets (162 mg total) by mouth daily. 30 tablet 0  . atorvastatin (LIPITOR) 40 MG tablet Take 20 mg by mouth daily.     . dapsone 100 MG tablet TAKE 1 TABLET BY MOUTH EVERY DAY 30 tablet 3  . Multiple Vitamin (MULTIVITAMIN) tablet Take 1 tablet by mouth daily.     . pantoprazole (PROTONIX) 40 MG tablet Take 40 mg by mouth 2 (two) times daily.     . prochlorperazine (COMPAZINE) 10 MG tablet Take 1 tablet (10 mg total) by mouth every 6 (six) hours as needed for nausea or vomiting. 30 tablet 0  . tamsulosin (FLOMAX)  0.4 MG CAPS capsule Take 0.4 mg by mouth daily.    . isosorbide mononitrate (IMDUR) 30 MG 24 hr tablet Take 0.5 tablets (15 mg total) by mouth daily. 30 tablet 3   No current facility-administered medications for this visit.   Facility-Administered Medications Ordered in Other Visits  Medication Dose Route Frequency Provider Last Rate Last Dose  . sodium chloride 0.9 % injection 10 mL 10 mL Intracatheter PRN [name], MD    . sodium chloride 0.9 % injection 10 mL 10 mL Intracatheter PRN [name], MD       Review of Systems:  Review of Systems  Constitutional: Positive for malaise/fatigue. Negative for fever, chills, weight loss and diaphoresis.  HENT: Negative for congestion.  Eyes: Negative for  discharge and redness.  Respiratory: Negative for cough, hemoptysis, sputum production, shortness of breath and wheezing.  Cardiovascular: Positive for chest pain. Negative for palpitations, orthopnea, claudication, leg swelling and PND.   1-2/10 chest pain burning  Gastrointestinal: Positive for heartburn. Negative for nausea, vomiting, blood in stool and melena.  Genitourinary: Negative for hematuria.  Musculoskeletal: Negative for myalgias and falls.  Skin: Negative for rash.  Neurological: Positive for weakness. Negative for dizziness, tingling, tremors, sensory change, speech change, focal weakness and loss of consciousness.  Endo/Heme/Allergies: Does not bruise/bleed easily.  Psychiatric/Behavioral: Negative for substance abuse. The patient is not nervous/anxious.  All other systems reviewed and are negative.    Physical Exam:  Blood pressure 140/58, pulse 61, height _0  (1.727 m), weight 165 lb 12 oz (75.184 kg). BMI: Body mass index is 25.21 kg/(m^2). General: Pleasant, NAD. Psych: Normal affect. Responds to questions with normal affect.  Neuro: Alert and oriented X 3. Moves all extremities  spontaneously. HEENT: Normocephalic, atraumatic. EOM intact. Sclera anicteric.  Neck: Trachea midline. Supple without bruits or JVD. Lungs: Respirations regular and unlabored. CTA bilaterally without wheezing, crackles, or rhonchi.  Heart: RRR, normal s3, s4. II/VI systolic murmurs. No rubs, or gallops.  Abdomen: Soft, non-tender, non-distended, BS + x 4.  Extremities: No clubbing, cyanosis or edema. DP/PT/Radials 2+ and equal bilaterally.   Accessory Clinical Findings:  EKG: NSR, 61 bpm, RBBB, no significant st/t changes   Recent Labs: 01/12/2015: B Natriuretic Peptide 115.0* 01/29/2015: Magnesium 2.2 04/18/2015: ALT 8* 05/09/2015: BUN 12; Creatinine, Ser 1.23; Potassium 3.6; Sodium 139 05/10/2015: Hemoglobin 10.4*; Platelets 162; TSH 1.247  01/13/2015: Cholesterol 85; HDL 36*; LDL Cholesterol 37; Total CHOL/HDL Ratio 2.4; Triglycerides 62; VLDL 12  Estimated Creatinine Clearance: 46.3 mL/min (by C-G formula based on Cr of 1.23).  Weights: Wt Readings from Last 3 Encounters:  05/11/15 165 lb 12 oz (75.184 kg)  05/09/15 160 lb (72.576 kg)  05/01/15 160 lb (72.576 kg)    Other studies Reviewed: Additional studies/ records that were reviewed today include: prior office notes and ED note.  Assessment & Plan:  1. Unstable angina/CAD s/p CABG in 2000: -Persisting symptoms of chest pain, ongoing fatigue, and visceral uneasiness in the setting of normal nuclear stress testing in June 2016 -Presenting for cardiac catheterization.  -Perhaps his GI complaints are secondary to RCA lesion -Add Imdur 15 mg daily -Continue aspirin 81 mg, and Lipitor 40 mg daily -Risks and benefits of cardiac catheterization have been discussed with the patient including risks of bleeding, bruising, infection, kidney damage, stroke, heart attack, and death. The patient understands these risks and is willing to proceed with the procedure. All questions have been answered and concerns listened to.    2. GI issues: -He does not define these as true reflux -Cardiac cath as above to evaluate for cardiac ischemia -Continue current GI regimen  -If no cardiac ischemia, follow up with GI  3. History of chronic respiratory failure: -Never with SOB -Decreased pulse ox readings felt to be 2/2 Dapsone interactions with oxygen and hgb binding per pulmonary  -ABG of 94.1% -Now off oxygen, feels fine  4. HTN: -BP has been elevated as of late -Add Imdur as above -Continue to monitor closely   5. HLD: -Lipitor as above    Dispo: -Follow up post cardiac cath  Current medicines are reviewed at length with the patient today. The patient did not have any concerns regarding medicines.

## 2015-05-26 ENCOUNTER — Ambulatory Visit
Admission: RE | Admit: 2015-05-26 | Discharge: 2015-05-26 | Disposition: A | Discharge status: Home / Self Care | Payer: Medicare PPO | Source: Ambulatory Visit | Attending: Cardiovascular Disease | Admitting: Cardiovascular Disease

## 2015-05-26 ENCOUNTER — Encounter: Admission: RE | Payer: Self-pay | Source: Ambulatory Visit

## 2015-05-26 ENCOUNTER — Encounter: Payer: Self-pay | Admitting: *Deleted

## 2015-05-26 ENCOUNTER — Ambulatory Visit: Admission: RE | Admit: 2015-05-26 | Payer: Medicare PPO | Source: Ambulatory Visit | Admitting: Cardiovascular Disease

## 2015-05-26 ENCOUNTER — Encounter
Admission: RE | Disposition: A | Discharge status: Home / Self Care | Payer: Self-pay | Source: Ambulatory Visit | Attending: Cardiovascular Disease

## 2015-05-26 DIAGNOSIS — R600 Localized edema: Secondary | ICD-10-CM | POA: Insufficient documentation

## 2015-05-26 DIAGNOSIS — I259 Chronic ischemic heart disease, unspecified: Secondary | ICD-10-CM | POA: Diagnosis not present

## 2015-05-26 DIAGNOSIS — Z9849 Cataract extraction status, unspecified eye: Secondary | ICD-10-CM | POA: Insufficient documentation

## 2015-05-26 DIAGNOSIS — I1310 Hypertensive heart and chronic kidney disease without heart failure, with stage 1 through stage 4 chronic kidney disease, or unspecified chronic kidney disease: Secondary | ICD-10-CM | POA: Insufficient documentation

## 2015-05-26 DIAGNOSIS — N400 Enlarged prostate without lower urinary tract symptoms: Secondary | ICD-10-CM | POA: Diagnosis not present

## 2015-05-26 DIAGNOSIS — I2582 Chronic total occlusion of coronary artery: Secondary | ICD-10-CM | POA: Diagnosis not present

## 2015-05-26 DIAGNOSIS — C9591 Leukemia, unspecified, in remission: Secondary | ICD-10-CM | POA: Insufficient documentation

## 2015-05-26 DIAGNOSIS — R079 Chest pain, unspecified: Secondary | ICD-10-CM | POA: Diagnosis present

## 2015-05-26 DIAGNOSIS — J45909 Unspecified asthma, uncomplicated: Secondary | ICD-10-CM | POA: Insufficient documentation

## 2015-05-26 DIAGNOSIS — E785 Hyperlipidemia, unspecified: Secondary | ICD-10-CM | POA: Diagnosis not present

## 2015-05-26 DIAGNOSIS — N183 Chronic kidney disease, stage 3 (moderate): Secondary | ICD-10-CM | POA: Insufficient documentation

## 2015-05-26 DIAGNOSIS — Z79899 Other long term (current) drug therapy: Secondary | ICD-10-CM | POA: Diagnosis not present

## 2015-05-26 DIAGNOSIS — J449 Chronic obstructive pulmonary disease, unspecified: Secondary | ICD-10-CM | POA: Insufficient documentation

## 2015-05-26 DIAGNOSIS — Z951 Presence of aortocoronary bypass graft: Secondary | ICD-10-CM | POA: Diagnosis not present

## 2015-05-26 DIAGNOSIS — Z7982 Long term (current) use of aspirin: Secondary | ICD-10-CM | POA: Insufficient documentation

## 2015-05-26 DIAGNOSIS — I2510 Atherosclerotic heart disease of native coronary artery without angina pectoris: Secondary | ICD-10-CM | POA: Diagnosis not present

## 2015-05-26 DIAGNOSIS — K219 Gastro-esophageal reflux disease without esophagitis: Secondary | ICD-10-CM | POA: Diagnosis not present

## 2015-05-26 DIAGNOSIS — I209 Angina pectoris, unspecified: Secondary | ICD-10-CM

## 2015-05-26 DIAGNOSIS — R5383 Other fatigue: Secondary | ICD-10-CM | POA: Insufficient documentation

## 2015-05-26 HISTORY — PX: CARDIAC CATHETERIZATION: SHX172

## 2015-05-26 SURGERY — LEFT HEART CATH
Anesthesia: Moderate Sedation

## 2015-05-26 SURGERY — LEFT HEART CATH AND CORONARY ANGIOGRAPHY
Anesthesia: Moderate Sedation | Laterality: Bilateral

## 2015-05-26 MED ORDER — SODIUM CHLORIDE 0.9 % WEIGHT BASED INFUSION: 3.0000 mL/kg/h | INTRAVENOUS | Status: DC

## 2015-05-26 MED ORDER — FENTANYL CITRATE (PF) 100 MCG/2ML IJ SOLN
INTRAMUSCULAR | Status: AC
  Filled 2015-05-26: qty 2

## 2015-05-26 MED ORDER — MIDAZOLAM HCL 2 MG/2ML IJ SOLN
INTRAMUSCULAR | Status: DC | PRN
  Administered 2015-05-26: 1 mg via INTRAVENOUS

## 2015-05-26 MED ORDER — HEPARIN (PORCINE) IN NACL 2-0.9 UNIT/ML-% IJ SOLN
INTRAMUSCULAR | Status: AC
  Filled 2015-05-26: qty 1000

## 2015-05-26 MED ORDER — SODIUM CHLORIDE 0.9 % WEIGHT BASED INFUSION: 1.0000 mL/kg/h | INTRAVENOUS | Status: DC

## 2015-05-26 MED ORDER — SODIUM CHLORIDE 0.9 % WEIGHT BASED INFUSION
3.0000 mL/kg/h | INTRAVENOUS | Status: DC
  Administered 2015-05-26: 3 mL/kg/h via INTRAVENOUS

## 2015-05-26 MED ORDER — FENTANYL CITRATE (PF) 100 MCG/2ML IJ SOLN
INTRAMUSCULAR | Status: DC | PRN
  Administered 2015-05-26: 25 ug via INTRAVENOUS

## 2015-05-26 MED ORDER — MIDAZOLAM HCL 2 MG/2ML IJ SOLN
INTRAMUSCULAR | Status: AC
  Filled 2015-05-26: qty 2

## 2015-05-26 MED ORDER — ASPIRIN 81 MG PO CHEW: 81.0000 mg | CHEWABLE_TABLET | ORAL | Status: DC

## 2015-05-26 SURGICAL SUPPLY — 11 items
CATH INFINITI 5 FR IM (CATHETERS) ×3 IMPLANT
CATH INFINITI 5FR ANG PIGTAIL (CATHETERS) ×3 IMPLANT
CATH INFINITI 5FR JL4 (CATHETERS) ×3 IMPLANT
CATH INFINITI JR4 5F (CATHETERS) ×3 IMPLANT
DEVICE CLOSURE MYNXGRIP 5F (Vascular Products) ×3 IMPLANT
KIT MANI 3VAL PERCEP (MISCELLANEOUS) ×3 IMPLANT
NEEDLE PERC 18GX7CM (NEEDLE) ×3 IMPLANT
PACK CARDIAC CATH (CUSTOM PROCEDURE TRAY) ×3 IMPLANT
SHEATH AVANTI 5FR X 11CM (SHEATH) ×3 IMPLANT
WIRE EMERALD 3MM-J .035X150CM (WIRE) ×3 IMPLANT
WIRE EMERALD 3MM-J .035X260CM (WIRE) ×3 IMPLANT

## 2015-05-26 NOTE — Discharge Instructions (Signed)

## 2015-05-26 NOTE — Progress Notes (Signed)
[facility]  Telephone:([phone] [phone]  ID: Frank Mcdaniel [place] OB: Jan 08, 1933  MR#: [id number]  UXL#:[id number]  Patient Care Team: Morton Peters., MD as PCP - General (Unknown Physician Specialty) [name], MD as Consulting Physician (Oncology)  CHIEF COMPLAINT:  Chief Complaint  Patient presents with  . Results    INTERVAL HISTORY: Patient returns to clinic today for further evaluation and discussion of his bone marrow biopsy results. He continues to feel weak and fatigued, but overall feels improved since initiating treatment. He does not complain of nausea today. He continues to have mild dyspnea on exertion, but no longer requires oxygen. He had an episode of chest pain yesterday and went to the emergency room for evaluation. Cardiac catheterization is planned in the future. He denies any fevers. He has no neurologic complaints. He denies any chest pain. He denies any weight loss. He has no urinary complaints. Patient offers no further specific complaints.   REVIEW OF SYSTEMS:   Review of Systems  Constitutional: Positive for malaise/fatigue. Negative for fever.  Respiratory: Negative.   Cardiovascular: Positive for chest pain.  Gastrointestinal: Negative for nausea and vomiting.  Musculoskeletal: Negative.   Neurological: Positive for weakness.    As per HPI. Otherwise, a complete review of systems is negatve.  PAST MEDICAL HISTORY: Past Medical History  Diagnosis Date  . Coronary artery disease     a. 2000 s/p CABG;  b. 10/2008 Neg MV, EF 64%.  [name] BPH (benign prostatic hyperplasia)   . Hyperlipidemia   . Leukemia ([place])   . H/O echocardiogram     a. 10/2005 Echo: nl EF.  [name] Atypical chest pain     a. 08/2014  . Asymptomatic Sinus Bradycardia   . Asthma   . Hypertension   . GERD (gastroesophageal reflux disease)   . HTN (hypertension)   . CKD (chronic kidney disease), stage III   . COPD (chronic obstructive  pulmonary disease) ([place])   . Shortness of breath dyspnea   . Dysrhythmia     PAST SURGICAL HISTORY: Past Surgical History  Procedure Laterality Date  . Coronary artery bypass graft  2000  . Cataract extraction    . Cardiac catheterization N/A 05/26/2015    Procedure: Left Heart Cath;  Surgeon: [name], MD;  Location: [facility];  Service: Cardiovascular;  Laterality: N/A;  . Cardiac catheterization N/A 05/26/2015    Procedure: Coronary/Graft Angiography;  Surgeon: [name], MD;  Location: [facility];  Service: Cardiovascular;  Laterality: N/A;    FAMILY HISTORY Family History  Problem Relation Age of Onset  . Other      no premature CAD.       ADVANCED DIRECTIVES:    HEALTH MAINTENANCE: Social History  Substance Use Topics  . Smoking status: Never Smoker   . Smokeless tobacco: Never Used  . Alcohol Use: No     Colonoscopy:  PAP:  Bone density:  Lipid panel:  No Known Allergies  Current Outpatient Prescriptions  Medication Sig Dispense Refill  . acyclovir (ZOVIRAX) 200 MG capsule Take 200 mg by mouth 5 (five) times daily.    [name] aspirin 81 MG EC tablet Take 2 tablets (162 mg total) by mouth daily. 30 tablet 0  . atorvastatin (LIPITOR) 40 MG tablet Take 20 mg by mouth daily.     . dapsone 100 MG tablet TAKE 1 TABLET BY MOUTH EVERY DAY 30 tablet 3  . Multiple Vitamin (MULTIVITAMIN) tablet  Take 1 tablet by mouth daily.      . pantoprazole (PROTONIX) 40 MG tablet Take 40 mg by mouth 2 (two) times daily.     . prochlorperazine (COMPAZINE) 10 MG tablet Take 1 tablet (10 mg total) by mouth every 6 (six) hours as needed for nausea or vomiting. 30 tablet 0  . tamsulosin (FLOMAX) 0.4 MG CAPS capsule Take 0.4 mg by mouth daily.    . isosorbide mononitrate (IMDUR) 30 MG 24 hr tablet Take 0.5 tablets (15 mg total) by mouth daily. 30 tablet 3   No current facility-administered medications for this visit.   Facility-Administered Medications  Ordered in Other Visits  Medication Dose Route Frequency Provider Last Rate Last Dose  . [START ON 05/27/2015] 0.9% sodium chloride infusion  3 mL/kg/hr Intravenous Continuous [name], MD       Followed by  . [START ON 05/27/2015] 0.9% sodium chloride infusion  1 mL/kg/hr Intravenous Continuous [name], MD      . Derrill Memo ON 05/27/2015] 0.9% sodium chloride infusion  3 mL/kg/hr Intravenous Continuous [name], MD 225.6 mL/hr at 05/26/15 0721 3 mL/kg/hr at 05/26/15 0721   Followed by  . [START ON 05/27/2015] 0.9% sodium chloride infusion  1 mL/kg/hr Intravenous Continuous [name], MD      . Derrill Memo ON 05/27/2015] aspirin chewable tablet 81 mg  81 mg Oral Pre-Cath [name], MD      . sodium chloride 0.9 % injection 10 mL  10 mL Intracatheter PRN [name], MD      . sodium chloride 0.9 % injection 10 mL  10 mL Intracatheter PRN [name], MD        OBJECTIVE: There were no vitals filed for this visit.   There is no weight on file to calculate BMI.    ECOG FS:1 - Symptomatic but completely ambulatory  General: Well-developed, well-nourished, no acute distress. Eyes: anicteric sclera. Lungs: Clear to auscultation bilaterally. Heart: Regular rate and rhythm. No rubs, murmurs, or gallops. Abdomen: Soft, nontender, nondistended. No organomegaly noted, normoactive bowel sounds. Musculoskeletal: No edema, cyanosis, or clubbing. Neuro: Alert, answering all questions appropriately. Cranial nerves grossly intact. Skin: No rashes or petechiae noted. Psych: Normal affect.   LAB RESULTS:  Lab Results  Component Value Date   NA 141 05/11/2015   K 3.8 05/11/2015   CL 104 05/11/2015   CO2 25 05/11/2015   GLUCOSE 120* 05/11/2015   BUN 10 05/11/2015   CREATININE 1.28* 05/11/2015   CALCIUM 8.9 05/11/2015   PROT 6.4* 04/18/2015   ALBUMIN 4.2 04/18/2015   AST 25 04/18/2015   ALT 8* 04/18/2015   ALKPHOS 62 04/18/2015   BILITOT 0.9  04/18/2015   GFRNONAA 53* 05/11/2015   GFRAA 61 05/11/2015    Lab Results  Component Value Date   WBC 4.3 05/11/2015   NEUTROABS 3.1 05/01/2015   HGB 10.4* 05/10/2015   HCT 29.3* 05/11/2015   MCV 107.3* 05/10/2015   PLT 162 05/10/2015     STUDIES: Dg Chest 2 View  05/09/2015  CLINICAL DATA:  Chest tightness today. EXAM: CHEST  2 VIEW COMPARISON:  January 28, 2015 FINDINGS: The heart size and mediastinal contours are stable. Patient status post prior CABG and median sternotomy. Both lungs are clear. Degenerative joint changes of the spine are noted. IMPRESSION: No active cardiopulmonary disease. Electronically Signed   By: Abelardo Diesel M.D.   On: 05/09/2015 21:04   Ct Biopsy  05/01/2015  CLINICAL  DATA:  Hairy cell leukemia. Status post chemotherapy. Subsequent encounter EXAM: CT GUIDED RIGHT ILIAC BONE MARROW ASPIRATION AND CORE BIOPSY Date:  9/26/20169/26/2016 10:01 am Radiologist:  M. Daryll Brod, MD Guidance:  CT FLUOROSCOPY TIME:  None. MEDICATIONS AND MEDICAL HISTORY: 1% lidocaine locally. ANESTHESIA/SEDATION: None. CONTRAST:  None. COMPLICATIONS: None PROCEDURE: Informed consent was obtained from the patient following explanation of the procedure, risks, benefits and alternatives. The patient understands, agrees and consents for the procedure. All questions were addressed. A time out was performed. The patient was positioned prone and noncontrast localization CT was performed of the pelvis to demonstrate the iliac marrow spaces. Maximal barrier sterile technique utilized including caps, mask, sterile gowns, sterile gloves, large sterile drape, hand hygiene, and betadine prep. Under sterile conditions and local anesthesia, an 11 gauge coaxial bone biopsy needle was advanced into the right iliac marrow space. Needle position was confirmed with CT imaging. Initially, bone marrow aspiration was performed. Next, the 11 gauge outer cannula was utilized to obtain a right iliac bone marrow core  biopsy. Needle was removed. Hemostasis was obtained with compression. The patient tolerated the procedure well. Samples were prepared with the cytotechnologist. No immediate complications. IMPRESSION: CT guided right iliac bone marrow aspiration and core biopsy. Electronically Signed   By: Jerilynn Mages.  Shick M.D.   On: 05/01/2015 10:18    ASSESSMENT:  Hairy cell leukemia.  PLAN:   1. Hairy cell leukemia: Patient initially received cladribine in 2006 and then was treated for a relapse in 2011 and again in 2014. Bone marrow biopsy confirmed recurrence of disease with 80-90% hairy cell leukemia noted in his specimen. Patient recently completed cladribine 0.15 mg/kg on days 1 through 5 along with 8 weekly cycles of Rituxan. Bone marrow biopsy results reviewed independently without evidence of residual disease.  No further intervention is needed at this time. Patient does not require additional treatment. Return to clinic in 3 months with repeat laboratory work and further evaluation. Patient has been instructed to discontinue Valtrex and Bactrim prophylaxis, but may need to restart if another cycle of chemotherapy is planned. 2. Pancytopenia: Resolved. Patient only has residual anemia. 3. Shortness breath:  Improved. Patient no longer requires supplemental oxygen. Patient had an arterial blood gas drawn on February 22, 2015 without supplemental oxygen that reported an oxygen saturation of 94% and a PO2 of 82. 4. Nausea: Unlikely related to Rituxan. Continue Compazine as needed.  5. Chest pain: Likely cardiac in nature. Patient has a catheterization scheduled later this month.  Patient expressed understanding and was in agreement with this plan. He also understands that He can call clinic at any time with any questions, concerns, or complaints.     [name], MD   05/26/2015 2:01 PM

## 2015-05-29 ENCOUNTER — Ambulatory Visit: Payer: Medicare PPO | Admitting: Physician Assistant

## 2015-05-29 ENCOUNTER — Telehealth: Payer: Self-pay | Admitting: *Deleted

## 2015-05-29 ENCOUNTER — Encounter: Payer: Self-pay | Admitting: Physician Assistant

## 2015-05-29 MED ORDER — DAPSONE 100 MG PO TABS: 100.0000 mg | ORAL_TABLET | Freq: Every day | ORAL | Status: DC

## 2015-05-29 NOTE — Telephone Encounter (Signed)
Faxed

## 2015-05-31 ENCOUNTER — Encounter: Payer: Self-pay | Admitting: Physician Assistant

## 2015-06-01 ENCOUNTER — Encounter: Payer: Self-pay | Admitting: Physician Assistant

## 2015-06-01 ENCOUNTER — Ambulatory Visit (INDEPENDENT_AMBULATORY_CARE_PROVIDER_SITE_OTHER): Payer: Medicare PPO | Admitting: Physician Assistant

## 2015-06-01 VITALS — BP 152/60 | HR 63 | Ht 68.0 in | Wt 165.5 lb

## 2015-06-01 DIAGNOSIS — C9140 Hairy cell leukemia not having achieved remission: Secondary | ICD-10-CM

## 2015-06-01 DIAGNOSIS — I2510 Atherosclerotic heart disease of native coronary artery without angina pectoris: Secondary | ICD-10-CM | POA: Diagnosis not present

## 2015-06-01 DIAGNOSIS — R110 Nausea: Secondary | ICD-10-CM

## 2015-06-01 DIAGNOSIS — I10 Essential (primary) hypertension: Secondary | ICD-10-CM | POA: Diagnosis not present

## 2015-06-01 DIAGNOSIS — K219 Gastro-esophageal reflux disease without esophagitis: Secondary | ICD-10-CM | POA: Diagnosis not present

## 2015-06-01 DIAGNOSIS — I209 Angina pectoris, unspecified: Secondary | ICD-10-CM

## 2015-06-01 DIAGNOSIS — F419 Anxiety disorder, unspecified: Secondary | ICD-10-CM

## 2015-06-01 DIAGNOSIS — I25701 Atherosclerosis of coronary artery bypass graft(s), unspecified, with angina pectoris with documented spasm: Secondary | ICD-10-CM

## 2015-06-01 MED ORDER — ISOSORBIDE MONONITRATE ER 30 MG PO TB24: 30.0000 mg | ORAL_TABLET | Freq: Every day | ORAL | Status: DC

## 2015-06-01 NOTE — Patient Instructions (Signed)
Medication Instructions:  Your physician has recommended you make the following change in your medication:  INCREASE imdur to 30mg  once per day   Labwork: none  Testing/Procedures: none  Follow-Up: Your physician recommends that you schedule a follow-up appointment in: three months   Any Other Special Instructions Will Be Listed Below (If Applicable). Referral to GI      If you need a refill on your cardiac medications before your next appointment, please call your pharmacy.

## 2015-06-02 ENCOUNTER — Telehealth: Payer: Self-pay

## 2015-06-02 DIAGNOSIS — K219 Gastro-esophageal reflux disease without esophagitis: Secondary | ICD-10-CM | POA: Insufficient documentation

## 2015-06-02 NOTE — Telephone Encounter (Signed)
Spoke w/ pt.  He reports that he was working around the house today and all of a sudden he feels weak, tired, and that he just can't go anymore. He reports that he is still symptomatic. Pt was seen yesterday by Christell Faith, PA as a cath f/u. He had the symptoms previously over the summer, but the episode resolved quickly on it's own.  Pt has not way to check his BP, manual HR is  WNL. Reports that he can feel his heart beating in his chest. Asked Ryan to review pt's sx while he is on the phone.  Pt has significant anxiety and his concerned that the "blockage in that small artery" seen on cath may be causing sx. Reassured pt that sx are not r/t ischemia. Advised pt to rest and not climb ladders any more today. He is agreeable and will call back if sx worsen or continue.

## 2015-06-02 NOTE — Telephone Encounter (Signed)
Pt states this morning around 10 he was doing work around his house, and he could"feel hmy heart, bump, bump, bump". States he also feels exhausted, almost like he is going to pass out. No pain, dizziness, no SOB Still feels that way now. Please call.

## 2015-06-02 NOTE — Progress Notes (Signed)
Cardiology Office Note:  Date of Encounter: 06/02/2015  ID: Frank Mcdaniel, DOB 1932-11-11, MRN [id number]  PCP:  Morton Peters, MD Primary Cardiologist:  Dr. Rockey Situ, MD  Chief Complaint  Patient presents with  . other    F/u ED c/o chest discomfort and stomach problems. Meds reveiwed verbally with pt.    HPI:  81 year old male with history of CAD s/p CABG in 2000, hairy cell leukemia followed closely by oncology, prior history of renal insufficiency, chronic LE edema, HLD, asthma as a child, BPH, prior chronic respiratory failure felt to be 2/2 Dapsone now off oxygen therapy, and chronic chest pain who presents to the office today after undergoing diagnostic cardiac cath on 10/21 in the setting of increased chest pain that showed patent grafts x 4, likely jump graft OM2, OM3 with medical management being recommended and no intervention indicated at that time.   Since the summer of 2015, he has been experiencing frequent nausea and queasiness. He was evaluated by GI in 04/2014 with EGD and was placed on prilosec.This seemed to help his queasiness some, but he has continued to have a fair amount of nausea on almost a daily basis. Despite this, his appetite had been good and he had not had any change in his PO intake. Further, he has never had vomiting, heartburn/gerd symptoms, constipation, or diarrhea. No melena or brbpr. In May 2016, he was seen by GI again 2/2 ongoing nausea, and his PPI was switched to Protonix and Zantac was added in the PM. He thinks that the addition of Zantac helped some but he continued to have a low level of nausea daily.   He presented to [facility] ED on 6/9 with with abrupt weakness while pressure washing on his patio and moving patio furniture 3 weeks prior to admission. He did not have chest pain or dyspnea. ECG was non-acute and troponin was negative. He noted brief 1/10 left sided chest pain. He was discharged home from ED and was scheduled  to follow up with cardiology as an outpatient. Unfortunately, when he woke up on 6/10 he felt very weak and nauseated with some abdominal cramping. This was worse than usual and so he presented back to the ED for evaluation. Again, ECG was non-acute and CE negative. He again noted a brief episode of 1/10 "sensation" in his left upper chest and also a brief period of dyspnea while in the ED. He was admitted for further evaluation. He underwent nuclear stress test on 6/10 that was normal, and low risk, normal LV EF at 55-65%. CTA chest ruled out PE. Echo showed EF 55-60%, no RWMA, normal LV diastolic function, normal PASP. He was noted to be mildly hypoxic and discharged home on oxygen. If his hypoxia persisted a pulmonary evaluation was advised. He did reportedly undergo PFT in July, though these results are not available in Epic or Care Everywhere.   He presented to [facility] ED on 10/4 with chest pain with associated arm pain. Cardiac enzymes were negative x 2. CXR showed no acute cardiopulmonary diseae. ECG sinus bradycardia, 59 bpm, RBBB (old). HGB 9.9, K+ 3.6 with possible hemolysis. He was discharged from the ED.    In ED follow up on 10/6, he reported 1-2/10 centralized "chest burning" that is both exertional and nonexertional. Chest burning sensation was worse when he exerted himself. There was associated radiation down his left arm. No associated SOB, diaphoresis, palpitations, nausea, vomiting, presyncope, or syncope. At the time of his  visit on 05/11/2015, he had not had a cardiac cath since his bypass surgery in 2000. He also noted continued feelings of generalized fatigue and lack of energy. He recently underwent bone marrow biopsy at the end of September that showed no relapse of his leukemia. He had continued to have persisting GI "upset." He did not describe this as reflux, just that his stomach does not like him. Symptoms have been present since the summer of 2015 and have not improved with  multiple antacids, H2 blockers, PPI's, or GI evaluations. Symptoms are not worse with or without food. No change in bowel habits. His chronic O2 therapy had been discontinued by pulmonary as it was felt this was secondary to his Dapsone. He underwent PFTs that were above normal levels and ABG testing that showed oxygen saturations of 94.1%. He does not feel SOB without his O2, and he never did feel SOB.   He underwent cardiac catheterization on 05/26/2015 that showed LM 85% severely calcified, prox LAD-1 lesion 80%, prox LAD-2 lesion 100% chronic, ostial LCx 70% severely calcified, OM4 90%, ostial RCA 70%, mid RCA 90%, LIMA-LAD patent, SVG-OM2 patent, SVG-OM3 patent, SVG-RPDA patent, no LV gram performed 2/2 renal insufficieny. No AS. Medical management was recommended. no intervention was indicated at that time. His symptoms were thought to be 2/2 his leukemia and general fatigue.   He has continued to have intermittent reflux with associated nausea without vomiting. On 10/26 he reported having the best day he has had in as far back as he could remember. 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 He felt completely fine. On 10/27 his reflux-like symptoms returned. No chest pain, SOB, diaphoresis, presyncope, syncope, or palpitations. He has not followed up with GI and wonders if he needs to. His is currently taking Protonix 40 mg daily, though has been advised to take this bid. He would like to stop taking this medication altogether.      Past Medical History  Diagnosis Date  . Coronary artery disease     a. 2000 s/p CABG;  b. 10/2008 Neg MV, EF 64%; c. cath 05/2015: LM 85% sev cal, pLAD-1 lesion 80%, pLAD-2 lesion 100% chronic, ostLCx 70% sev cal, OM4 90%, ostRCA 70%, mid RCA 90%, LIMA-LAD patent, VG-OM2 patent, VG-OM3 patent, VG-RPDA patent. No AS  . BPH (benign prostatic hyperplasia)   . Hyperlipidemia   . Hairy cell leukemia ([place])   . H/O  echocardiogram     a. 10/2005 Echo: nl EF.  [name] Atypical chest pain     a. 08/2014  . Asymptomatic Sinus Bradycardia   . Asthma   . Hypertension   . GERD (gastroesophageal reflux disease)   . HTN (hypertension)   . CKD (chronic kidney disease), stage III   . COPD (chronic obstructive pulmonary disease) ([place])   . Shortness of breath dyspnea   :  Past Surgical History  Procedure Laterality Date  . Coronary artery bypass graft  2000  . Cataract extraction    . Cardiac catheterization N/A 05/26/2015    Procedure: Left Heart Cath;  Surgeon: [name], MD;  Location: [place] [facility];  Service: Cardiovascular;  Laterality: N/A;  . Cardiac catheterization N/A 05/26/2015    Procedure: Coronary/Graft Angiography;  Surgeon: [name], MD;  Location: [place] [facility];  Service: Cardiovascular;  Laterality: N/A;  :  Social History:  The patient  reports that he has never smoked. He has never  used smokeless tobacco. He reports that he does not drink alcohol or use illicit drugs.   Family History  Problem Relation Age of Onset  . Other      no premature CAD.     Allergies:  No Known Allergies   Home Medications:  Current Outpatient Prescriptions  Medication Sig Dispense Refill  . acyclovir (ZOVIRAX) 200 MG capsule Take 200 mg by mouth 5 (five) times daily.    [name] aspirin 81 MG EC tablet Take 2 tablets (162 mg total) by mouth daily. 30 tablet 0  . atorvastatin (LIPITOR) 40 MG tablet Take 20 mg by mouth daily.     . dapsone 100 MG tablet Take 1 tablet (100 mg total) by mouth daily. 30 tablet 2  . isosorbide mononitrate (IMDUR) 30 MG 24 hr tablet Take 1 tablet (30 mg total) by mouth daily. 30 tablet 3  . Multiple Vitamin (MULTIVITAMIN) tablet Take 1 tablet by mouth daily.      . pantoprazole (PROTONIX) 40 MG tablet Take 40 mg by mouth 2 (two) times daily.     . prochlorperazine (COMPAZINE) 10 MG tablet Take 1 tablet (10 mg total) by mouth every 6 (six) hours as needed  for nausea or vomiting. 30 tablet 0  . tamsulosin (FLOMAX) 0.4 MG CAPS capsule Take 0.4 mg by mouth daily.     No current facility-administered medications for this visit.   Facility-Administered Medications Ordered in Other Visits  Medication Dose Route Frequency Provider Last Rate Last Dose  . sodium chloride 0.9 % injection 10 mL  10 mL Intracatheter PRN [name], MD      . sodium chloride 0.9 % injection 10 mL  10 mL Intracatheter PRN [name], MD         Review of Systems:  Review of Systems  Constitutional: Negative for fever, chills, weight loss, malaise/fatigue and diaphoresis.  HENT: Negative for congestion.   Eyes: Negative for discharge and redness.  Respiratory: Negative for cough, hemoptysis, sputum production, shortness of breath and wheezing.   Cardiovascular: Negative for chest pain, palpitations, orthopnea, claudication, leg swelling and PND.  Gastrointestinal: Positive for heartburn and abdominal pain. Negative for nausea, vomiting, diarrhea, constipation, blood in stool and melena.  Genitourinary: Negative for hematuria.  Musculoskeletal: Negative for myalgias, back pain, joint pain, falls and neck pain.  Skin: Negative for rash.  Neurological: Negative for dizziness, tingling, tremors, sensory change, speech change, focal weakness, loss of consciousness and weakness.  Endo/Heme/Allergies: Does not bruise/bleed easily.  Psychiatric/Behavioral: Negative for substance abuse. The patient is nervous/anxious.      Physical Exam:  Blood pressure 152/60, pulse 63, height _0  (1.727 m), weight 165 lb 8 oz (75.07 kg). BMI: Body mass index is 25.17 kg/(m^2). General: Pleasant, NAD. Psych: Normal affect. Responds to questions with normal affect.  Neuro: Alert and oriented X 3. Moves all extremities spontaneously. HEENT: Normocephalic, atraumatic. EOM intact. Sclera anicteric.  Neck: Trachea midline. Supple without bruits or JVD. Lungs:  Respirations  regular and unlabored. CTA bilaterally without wheezing, crackles, or rhonchi.  Heart: RRR, normal s3, s4. No murmurs, rubs, or gallops.  Abdomen: Soft, non-tender, non-distended, BS + x 4.  Extremities: No clubbing, cyanosis or edema. DP/PT/Radials 2+ and equal bilaterally. Cath site healing well. No bruits, active bleeding, bruising, or TTP.    Accessory Clinical Findings:  EKG: was placed in scan pile from desk prior to my review  Recent Labs: 01/12/2015: B Natriuretic Peptide 115.0* 01/29/2015: Magnesium 2.2 04/18/2015: ALT  8* 05/10/2015: Hemoglobin 10.4*; Platelets 162; TSH 1.247 05/11/2015: BUN 10; Creatinine, Ser 1.28*; Potassium 3.8; Sodium 141  01/13/2015: Cholesterol 85; HDL 36*; LDL Cholesterol 37; Total CHOL/HDL Ratio 2.4; Triglycerides 62; VLDL 12  CrCl cannot be calculated (Patient has no serum creatinine result on file.).  Weights: Wt Readings from Last 3 Encounters:  06/01/15 165 lb 8 oz (75.07 kg)  05/11/15 165 lb 12 oz (75.184 kg)  05/09/15 160 lb (72.576 kg)    Other studies Reviewed: Additional studies/ records that were reviewed today include: prior office notes.  Assessment & Plan:  1. CAD s/p 4 vessel CABG in 2000 as above:  -No further chest pain -Symptoms seem to be mostly related to GI as he continues to have reflux issues -Increase Imdur to 30 mg daily in effort to see if this helps with his symptoms as well as his BP -Aspirin 81 mg -Lipitor 40 mg  -Recommend he follow up with GI -He asks for a referral to [facility] for 2nd opinion, this was provided    2. Reflux: -As above  3. HTN: -Poorly controlled -Imdur increased as above -Continue to monitor closely  4. History of presumed chronic respiratory failure: -Never with SOB -Decreased pulse ox readings felt to be 2/2 Dapsone interactions with oxygen and hgb binding per pulmonary  -ABG of 94.1% -Now off oxygen, feels fine   Dispo: -Follow up 6 months  Current medicines are  reviewed at length with the patient today.  The patient did not have any concerns regarding medicines.   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 Pulse was in the 50's. No angina. He feels like he over worked himself. Advised patient to take his Imdur (he did) and rest. Call if symptoms change.

## 2015-06-06 ENCOUNTER — Telehealth: Payer: Self-pay

## 2015-06-06 NOTE — Telephone Encounter (Signed)
Left message on pt's vm w/ Ryan's recommendation.  Asked him to call back w/ any questions or concerns.

## 2015-06-06 NOTE — Telephone Encounter (Signed)
Pt called, states a GI dr from [facility], states Christell Faith, [place] office had contacted them with pt needing appt. Pt has an appt already with Dr. [name] in [place] on 12/7

## 2015-06-06 NOTE — Telephone Encounter (Signed)
Patient asked for [place] to make him an appointment with GI in [place] or [place]. If he has an appointment in [facility] he can disregard the appointment in [place].

## 2015-06-06 NOTE — Telephone Encounter (Signed)
A referral was sent to GI on his appt 10/27, but he already has an appt w/ his own GI doc.  He wants to make sure there was no special reason an appt was made in [place].

## 2015-06-12 ENCOUNTER — Telehealth: Payer: Self-pay | Admitting: *Deleted

## 2015-06-12 NOTE — Telephone Encounter (Signed)
Have pt f/u for lab and md appt in the next 1-2 weeks.

## 2015-06-12 NOTE — Telephone Encounter (Signed)
called to report persistent fatigue and that his PMD recommends he call us

## 2015-06-12 NOTE — Telephone Encounter (Signed)
Agrees to appt 11/16 at 315

## 2015-06-21 ENCOUNTER — Inpatient Hospital Stay: Payer: Medicare PPO | Attending: Oncology

## 2015-06-21 ENCOUNTER — Inpatient Hospital Stay (HOSPITAL_BASED_OUTPATIENT_CLINIC_OR_DEPARTMENT_OTHER): Payer: Medicare PPO | Admitting: Oncology

## 2015-06-21 VITALS — BP 154/73 | HR 64 | Temp 98.2°F | Resp 18 | Wt 166.7 lb

## 2015-06-21 DIAGNOSIS — R0609 Other forms of dyspnea: Secondary | ICD-10-CM | POA: Insufficient documentation

## 2015-06-21 DIAGNOSIS — Z9981 Dependence on supplemental oxygen: Secondary | ICD-10-CM | POA: Diagnosis not present

## 2015-06-21 DIAGNOSIS — Z79899 Other long term (current) drug therapy: Secondary | ICD-10-CM | POA: Diagnosis not present

## 2015-06-21 DIAGNOSIS — I2510 Atherosclerotic heart disease of native coronary artery without angina pectoris: Secondary | ICD-10-CM | POA: Insufficient documentation

## 2015-06-21 DIAGNOSIS — R0789 Other chest pain: Secondary | ICD-10-CM | POA: Insufficient documentation

## 2015-06-21 DIAGNOSIS — R5383 Other fatigue: Secondary | ICD-10-CM | POA: Diagnosis not present

## 2015-06-21 DIAGNOSIS — N183 Chronic kidney disease, stage 3 (moderate): Secondary | ICD-10-CM

## 2015-06-21 DIAGNOSIS — N400 Enlarged prostate without lower urinary tract symptoms: Secondary | ICD-10-CM | POA: Insufficient documentation

## 2015-06-21 DIAGNOSIS — Z9889 Other specified postprocedural states: Secondary | ICD-10-CM | POA: Insufficient documentation

## 2015-06-21 DIAGNOSIS — E785 Hyperlipidemia, unspecified: Secondary | ICD-10-CM

## 2015-06-21 DIAGNOSIS — Z7982 Long term (current) use of aspirin: Secondary | ICD-10-CM | POA: Diagnosis not present

## 2015-06-21 DIAGNOSIS — Z951 Presence of aortocoronary bypass graft: Secondary | ICD-10-CM | POA: Diagnosis not present

## 2015-06-21 DIAGNOSIS — I129 Hypertensive chronic kidney disease with stage 1 through stage 4 chronic kidney disease, or unspecified chronic kidney disease: Secondary | ICD-10-CM | POA: Diagnosis not present

## 2015-06-21 DIAGNOSIS — R531 Weakness: Secondary | ICD-10-CM | POA: Diagnosis not present

## 2015-06-21 DIAGNOSIS — C9141 Hairy cell leukemia, in remission: Secondary | ICD-10-CM | POA: Insufficient documentation

## 2015-06-21 DIAGNOSIS — K219 Gastro-esophageal reflux disease without esophagitis: Secondary | ICD-10-CM | POA: Diagnosis not present

## 2015-06-21 DIAGNOSIS — J449 Chronic obstructive pulmonary disease, unspecified: Secondary | ICD-10-CM | POA: Diagnosis not present

## 2015-06-21 DIAGNOSIS — C9140 Hairy cell leukemia not having achieved remission: Secondary | ICD-10-CM

## 2015-06-21 LAB — CBC WITH DIFFERENTIAL/PLATELET
BASOS PCT: 0 %
Basophils Absolute: 0 10*3/uL (ref 0–0.1)
Eosinophils Absolute: 0.3 10*3/uL (ref 0–0.7)
Eosinophils Relative: 5 %
HEMATOCRIT: 30.4 % — AB (ref 40.0–52.0)
HEMOGLOBIN: 10.1 g/dL — AB (ref 13.0–18.0)
LYMPHS ABS: 0.3 10*3/uL — AB (ref 1.0–3.6)
Lymphocytes Relative: 5 %
MCH: 34.6 pg — AB (ref 26.0–34.0)
MCHC: 33.1 g/dL (ref 32.0–36.0)
MCV: 104.4 fL — AB (ref 80.0–100.0)
MONO ABS: 0.7 10*3/uL (ref 0.2–1.0)
MONOS PCT: 12 %
NEUTROS ABS: 4.3 10*3/uL (ref 1.4–6.5)
Neutrophils Relative %: 78 %
Platelets: 159 10*3/uL (ref 150–440)
RBC: 2.91 MIL/uL — ABNORMAL LOW (ref 4.40–5.90)
RDW: 15.4 % — AB (ref 11.5–14.5)
WBC: 5.6 10*3/uL (ref 3.8–10.6)

## 2015-06-21 LAB — COMPREHENSIVE METABOLIC PANEL
ALBUMIN: 4.3 g/dL (ref 3.5–5.0)
ALK PHOS: 53 U/L (ref 38–126)
ALT: 9 U/L — ABNORMAL LOW (ref 17–63)
ANION GAP: 5 (ref 5–15)
AST: 24 U/L (ref 15–41)
BILIRUBIN TOTAL: 1.3 mg/dL — AB (ref 0.3–1.2)
BUN: 11 mg/dL (ref 6–20)
CALCIUM: 9 mg/dL (ref 8.9–10.3)
CO2: 26 mmol/L (ref 22–32)
Chloride: 106 mmol/L (ref 101–111)
Creatinine, Ser: 1.09 mg/dL (ref 0.61–1.24)
Glucose, Bld: 101 mg/dL — ABNORMAL HIGH (ref 65–99)
POTASSIUM: 3 mmol/L — AB (ref 3.5–5.1)
Sodium: 137 mmol/L (ref 135–145)
TOTAL PROTEIN: 6.6 g/dL (ref 6.5–8.1)

## 2015-06-21 NOTE — Progress Notes (Signed)
Patient is feeling more fatigued that usual and is having episodes of severe fatigue where he "feel like I can't go anymore".  The last severe fatigue episode was 3 weeks ago where he was walking around a car show but had to go sit in the care because he couldn't walk anymore.

## 2015-06-27 ENCOUNTER — Telehealth: Payer: Self-pay | Admitting: *Deleted

## 2015-06-27 MED ORDER — PROCHLORPERAZINE MALEATE 10 MG PO TABS: 10.0000 mg | ORAL_TABLET | Freq: Four times a day (QID) | ORAL | Status: DC | PRN

## 2015-06-27 NOTE — Telephone Encounter (Signed)
Escribed

## 2015-07-07 NOTE — Progress Notes (Signed)
[place]  [phone] [phone]  ID: Frank Mcdaniel [place] OB: Apr 27, 1933  MR#: [id number]  JAS#:[id number]  Patient Care Team: Morton Peters., MD as PCP - General (Unknown Physician Specialty) [name], MD as Consulting Physician (Oncology)  CHIEF COMPLAINT:  Chief Complaint  Patient presents with  . Fatigue    INTERVAL HISTORY: Patient returns to clinic today as an add-on with complaints of worsening weakness and fatigue. Patient states he is walking around a car show and became so tired he had to sit down which would be unusual for him. He otherwise has felt well. He does not complain of nausea today. He continues to have mild dyspnea on exertion, but no longer requires oxygen. He does not complain of chest pain today. He denies any fevers. He has no neurologic complaints. He denies any weight loss. He has no urinary complaints. Patient offers no further specific complaints.   REVIEW OF SYSTEMS:   Review of Systems  Constitutional: Positive for malaise/fatigue. Negative for fever.  Respiratory: Positive for shortness of breath.   Cardiovascular: Negative for chest pain.  Gastrointestinal: Negative for nausea and vomiting.  Musculoskeletal: Negative.   Neurological: Negative.     As per HPI. Otherwise, a complete review of systems is negatve.  PAST MEDICAL HISTORY: Past Medical History  Diagnosis Date  . Coronary artery disease     a. 2000 s/p CABG;  b. 10/2008 Neg MV, EF 64%; c. cath 05/2015: LM 85% sev cal, pLAD-1 lesion 80%, pLAD-2 lesion 100% chronic, ostLCx 70% sev cal, OM4 90%, ostRCA 70%, mid RCA 90%, LIMA-LAD patent, VG-OM2 patent, VG-OM3 patent, VG-RPDA patent. No AS  . BPH (benign prostatic hyperplasia)   . Hyperlipidemia   . Hairy cell leukemia ([place])   . H/O echocardiogram     a. 10/2005 Echo: nl EF.  [name] Atypical chest pain     a. 08/2014  . Asymptomatic Sinus Bradycardia   . Asthma   . Hypertension   .  GERD (gastroesophageal reflux disease)   . HTN (hypertension)   . CKD (chronic kidney disease), stage III   . COPD (chronic obstructive pulmonary disease) ([place])   . Shortness of breath dyspnea     PAST SURGICAL HISTORY: Past Surgical History  Procedure Laterality Date  . Coronary artery bypass graft  2000  . Cataract extraction    . Cardiac catheterization N/A 05/26/2015    Procedure: Left Heart Cath;  Surgeon: [name], MD;  Location: [place] [facility];  Service: Cardiovascular;  Laterality: N/A;  . Cardiac catheterization N/A 05/26/2015    Procedure: Coronary/Graft Angiography;  Surgeon: [name], MD;  Location: [place] [facility];  Service: Cardiovascular;  Laterality: N/A;    FAMILY HISTORY Family History  Problem Relation Age of Onset  . Other      no premature CAD.       ADVANCED DIRECTIVES:    HEALTH MAINTENANCE: Social History  Substance Use Topics  . Smoking status: Never Smoker   . Smokeless tobacco: Never Used  . Alcohol Use: No     Colonoscopy:  PAP:  Bone density:  Lipid panel:  No Known Allergies  Current Outpatient Prescriptions  Medication Sig Dispense Refill  . acyclovir (ZOVIRAX) 400 MG tablet Take 400 mg by mouth 2 (two) times daily.  4  . aspirin 81 MG EC tablet Take 2 tablets (162 mg total) by mouth daily. 30 tablet 0  . atorvastatin (LIPITOR) 40 MG tablet Take  20 mg by mouth daily.     . dapsone 100 MG tablet Take 1 tablet (100 mg total) by mouth daily. 30 tablet 2  . isosorbide mononitrate (IMDUR) 30 MG 24 hr tablet Take 1 tablet (30 mg total) by mouth daily. 30 tablet 3  . Multiple Vitamin (MULTIVITAMIN) tablet Take 1 tablet by mouth daily.      . tamsulosin (FLOMAX) 0.4 MG CAPS capsule Take 0.4 mg by mouth daily.    . prochlorperazine (COMPAZINE) 10 MG tablet Take 1 tablet (10 mg total) by mouth every 6 (six) hours as needed for nausea or vomiting. 30 tablet 0   No current facility-administered medications for this  visit.   Facility-Administered Medications Ordered in Other Visits  Medication Dose Route Frequency Provider Last Rate Last Dose  . sodium chloride 0.9 % injection 10 mL  10 mL Intracatheter PRN [name], MD      . sodium chloride 0.9 % injection 10 mL  10 mL Intracatheter PRN [name], MD        OBJECTIVE: Filed Vitals:   06/21/15 1514  BP: 154/73  Pulse: 64  Temp: 98.2 F (36.8 C)  Resp: 18     Body mass index is 25.35 kg/(m^2).    ECOG FS:1 - Symptomatic but completely ambulatory  General: Well-developed, well-nourished, no acute distress. Eyes: anicteric sclera. Lungs: Clear to auscultation bilaterally. Heart: Regular rate and rhythm. No rubs, murmurs, or gallops. Abdomen: Soft, nontender, nondistended. No organomegaly noted, normoactive bowel sounds. Musculoskeletal: No edema, cyanosis, or clubbing. Neuro: Alert, answering all questions appropriately. Cranial nerves grossly intact. Skin: No rashes or petechiae noted. Psych: Normal affect.   LAB RESULTS:  Lab Results  Component Value Date   NA 137 06/21/2015   K 3.0* 06/21/2015   CL 106 06/21/2015   CO2 26 06/21/2015   GLUCOSE 101* 06/21/2015   BUN 11 06/21/2015   CREATININE 1.09 06/21/2015   CALCIUM 9.0 06/21/2015   PROT 6.6 06/21/2015   ALBUMIN 4.3 06/21/2015   AST 24 06/21/2015   ALT 9* 06/21/2015   ALKPHOS 53 06/21/2015   BILITOT 1.3* 06/21/2015   GFRNONAA >60 06/21/2015   GFRAA >60 06/21/2015    Lab Results  Component Value Date   WBC 5.6 06/21/2015   NEUTROABS 4.3 06/21/2015   HGB 10.1* 06/21/2015   HCT 30.4* 06/21/2015   MCV 104.4* 06/21/2015   PLT 159 06/21/2015     STUDIES: No results found.  ASSESSMENT:  Hairy cell leukemia.  PLAN:   1. Hairy cell leukemia: Patient initially received cladribine in 2006 and then was treated for a relapse in 2011 and again in 2014. Bone marrow biopsy confirmed recurrence of disease with 80-90% hairy cell leukemia noted in his  specimen. Patient recently completed cladribine 0.15 mg/kg on days 1 through 5 along with 8 weekly cycles of Rituxan. Bone marrow biopsy results reviewed independently without evidence of residual disease.  No further intervention is needed at this time. Patient does not require additional treatment. Return to clinic in January at his previously scheduled follow-up appointment for repeat laboratory work and further evaluation. Patient has been instructed to discontinue Valtrex and Bactrim prophylaxis, but may need to restart if another cycle of chemotherapy is planned. 2. Pancytopenia: Resolved. Patient only has residual anemia. 3. Shortness breath:  Improved. Patient no longer requires supplemental oxygen. Patient had an arterial blood gas drawn on February 22, 2015 without supplemental oxygen that reported an oxygen saturation of 94% and a PO2  of 82. 4. Chest pain: Cardiac catheterization completed in October 2016. Treatment per cardiology. 5. Weakness: Unlikely related to a recurrence of his hairy cell leukemia, but unfortunately would require bone marrow biopsy to confirm. Patient does not wish to do this at this time. Return to clinic in January as above. Monitor.  Patient expressed understanding and was in agreement with this plan. He also understands that He can call clinic at any time with any questions, concerns, or complaints.     [name], MD   07/07/2015 5:27 PM

## 2015-07-12 ENCOUNTER — Ambulatory Visit (INDEPENDENT_AMBULATORY_CARE_PROVIDER_SITE_OTHER): Payer: Medicare PPO | Admitting: Gastroenterology

## 2015-07-12 ENCOUNTER — Encounter: Payer: Self-pay | Admitting: Gastroenterology

## 2015-07-12 ENCOUNTER — Other Ambulatory Visit: Payer: Self-pay

## 2015-07-12 VITALS — BP 136/66 | HR 58 | Temp 98.6°F | Ht 68.0 in | Wt 167.0 lb

## 2015-07-12 DIAGNOSIS — R1013 Epigastric pain: Secondary | ICD-10-CM

## 2015-07-12 DIAGNOSIS — R110 Nausea: Secondary | ICD-10-CM | POA: Diagnosis not present

## 2015-07-13 NOTE — Progress Notes (Signed)
Gastroenterology Consultation  Referring Provider:     Morton Peters.* Primary Care Physician:  Morton Peters, MD Primary Gastroenterologist:  Dr. Allen Norris     Reason for Consultation:     Nausea        HPI:   Frank Mcdaniel is a 81 y.o. y/o male referred for consultation & management of  nausea by Dr. Macario Carls, Marny Lowenstein, MD.   This patient comes today for a second opinion for chronic nausea. The patient states that he tried to follow up with his Gastroenterologist but states that whenever he calls there is just told to take another medication and never gets to speak to the doctor. He was recommended to come to see me for his chronic nausea. The patient states that he was put on a PPI and Zantac and the nausea did not go away so he was switched to Protonix and Zantac it still continues to have the nausea. He reports that he was on these medications for some time and was having nausea approximately 3 times a week. He then stopped these medications completely and now states he has nausea once a week. The patient also has a history of esophageal stricture with dilation. Since the dilation the patient reports only one episode of food feeling like it was getting stuck. The nausea is not related to any foods he takes. He does report some intermittent abdominal pain. There is no report of any unexplained weight loss fevers, chills or vomiting.  Past Medical History  Diagnosis Date  . Coronary artery disease     a. 2000 s/p CABG;  b. 10/2008 Neg MV, EF 64%; c. cath 05/2015: LM 85% sev cal, pLAD-1 lesion 80%, pLAD-2 lesion 100% chronic, ostLCx 70% sev cal, OM4 90%, ostRCA 70%, mid RCA 90%, LIMA-LAD patent, VG-OM2 patent, VG-OM3 patent, VG-RPDA patent. No AS  . BPH (benign prostatic hyperplasia)   . Hyperlipidemia   . Hairy cell leukemia ([place])   . H/O echocardiogram     a. 10/2005 Echo: nl EF.  [name] Atypical chest pain     a. 08/2014  . Asymptomatic Sinus Bradycardia   . Asthma     . Hypertension   . GERD (gastroesophageal reflux disease)   . HTN (hypertension)   . CKD (chronic kidney disease), stage III   . COPD (chronic obstructive pulmonary disease) ([place])   . Shortness of breath dyspnea     Past Surgical History  Procedure Laterality Date  . Coronary artery bypass graft  2000  . Cataract extraction    . Cardiac catheterization N/A 05/26/2015    Procedure: Left Heart Cath;  Surgeon: [name], MD;  Location: [facility];  Service: Cardiovascular;  Laterality: N/A;  . Cardiac catheterization N/A 05/26/2015    Procedure: Coronary/Graft Angiography;  Surgeon: [name], MD;  Location: [facility];  Service: Cardiovascular;  Laterality: N/A;    Prior to Admission medications   Medication Sig Start Date End Date Taking? Authorizing Provider  acyclovir (ZOVIRAX) 400 MG tablet Take 400 mg by mouth 2 (two) times daily. 06/06/15  Yes Historical Provider, MD  aspirin 81 MG EC tablet Take 2 tablets (162 mg total) by mouth daily. 11/08/10  Yes [name], MD  atorvastatin (LIPITOR) 40 MG tablet Take 20 mg by mouth daily.    Yes Historical Provider, MD  dapsone 100 MG tablet Take 1 tablet (100 mg total) by mouth daily. 05/29/15  Yes  [name], MD  isosorbide mononitrate (IMDUR) 30 MG 24 hr tablet Take 1 tablet (30 mg total) by mouth daily. 06/01/15  Yes Ryan M Dunn, PA-C  Multiple Vitamin (MULTIVITAMIN) tablet Take 1 tablet by mouth daily.     Yes Historical Provider, MD  prochlorperazine (COMPAZINE) 10 MG tablet Take 1 tablet (10 mg total) by mouth every 6 (six) hours as needed for nausea or vomiting. 06/27/15  Yes [name], MD  tamsulosin (FLOMAX) 0.4 MG CAPS capsule Take 0.4 mg by mouth daily.   Yes Historical Provider, MD  pantoprazole (PROTONIX) 40 MG tablet TAKE 1 TABLET BY MOUTH ONCE DAILY 30 MINUTES BEFORE BREAKFAST 06/24/15   Historical Provider, MD    Family History  Problem Relation Age of Onset  . Other       no premature CAD.  [name] Hypertension Mother   . Arthritis Father      Social History  Substance Use Topics  . Smoking status: Never Smoker   . Smokeless tobacco: Never Used  . Alcohol Use: No    Allergies as of 07/12/2015  . (No Known Allergies)    Review of Systems:    All systems reviewed and negative except where noted in HPI.   Physical Exam:  BP 136/66 mmHg  Pulse 58  Temp(Src) 98.6 F (37 C) (Oral)  Ht 5\' 8"  (1.727 m)  Wt 167 lb (75.751 kg)  BMI 25.40 kg/m2 No LMP for male patient. Psych:  Alert and cooperative. Normal mood and affect. General:   Alert,  Well-developed, well-nourished, pleasant and cooperative in NAD Head:  Normocephalic and atraumatic. Eyes:  Sclera clear, no icterus.   Conjunctiva pink. Ears:  Normal auditory acuity. Nose:  No deformity, discharge, or lesions. Mouth:  No deformity or lesions,oropharynx pink & moist. Neck:  Supple; no masses or thyromegaly. Lungs:  Respirations even and unlabored.  Clear throughout to auscultation.   No wheezes, crackles, or rhonchi. No acute distress. Heart:  Regular rate and rhythm; no murmurs, clicks, rubs, or gallops. Abdomen:  Normal bowel sounds.  No bruits.  Soft, non-tender and non-distended without masses, hepatosplenomegaly or hernias noted.  No guarding or rebound tenderness.  Negative Carnett sign.   Rectal:  Deferred.  Msk:  Symmetrical without gross deformities.  Good, equal movement & strength bilaterally. Pulses:  Normal pulses noted. Extremities:  No clubbing or edema.  No cyanosis. Neurologic:  Alert and oriented x3;  grossly normal neurologically. Skin:  Intact without significant lesions or rashes.  No jaundice. Lymph Nodes:  No significant cervical adenopathy. Psych:  Alert and cooperative. Normal mood and affect.  Imaging Studies: No results found.  Assessment and Plan:   Frank Mcdaniel is a 81 y.o. y/o male who comes today with a history of nausea. The patient is here for  second opinion since he has not gotten a response from his previous gastroenterologist. The patient's nausea is not related to when he eats for drinks. The patient appears to have had were symptoms when he was on a PPI and H2 blocker. The patient does have some dyspepsia. The nausea has decreased when he stopped his PPI and H2 blocker. The nausea increase was likely due to the PPI. The patient had been on H2-blockers in the past for many years without any problems. The patient will be put back on his H2 blocker and has been explained that there are many causes of nausea that are not G.I. related. Due to the nausea not being associated  with eating or drinking and because it is so intermittent with only happen once a week, Is not likely that the nausea is from a G.I. cause.  The patient has been told to continue his and they nausea medication and go back on the Zantac for his dyspepsia. The patient will let me know of his symptoms do not improve.   Note: This dictation was prepared with Dragon dictation along with smaller phrase technology. Any transcriptional errors that result from this process are unintentional.

## 2015-08-10 ENCOUNTER — Inpatient Hospital Stay (HOSPITAL_BASED_OUTPATIENT_CLINIC_OR_DEPARTMENT_OTHER): Payer: Medicare Other | Admitting: Oncology

## 2015-08-10 ENCOUNTER — Inpatient Hospital Stay: Payer: Medicare Other | Attending: Oncology

## 2015-08-10 VITALS — BP 124/63 | HR 60 | Temp 98.2°F | Resp 16 | Wt 168.2 lb

## 2015-08-10 DIAGNOSIS — E785 Hyperlipidemia, unspecified: Secondary | ICD-10-CM | POA: Diagnosis not present

## 2015-08-10 DIAGNOSIS — C9141 Hairy cell leukemia, in remission: Secondary | ICD-10-CM | POA: Insufficient documentation

## 2015-08-10 DIAGNOSIS — N400 Enlarged prostate without lower urinary tract symptoms: Secondary | ICD-10-CM | POA: Insufficient documentation

## 2015-08-10 DIAGNOSIS — Z79899 Other long term (current) drug therapy: Secondary | ICD-10-CM

## 2015-08-10 DIAGNOSIS — J45909 Unspecified asthma, uncomplicated: Secondary | ICD-10-CM | POA: Diagnosis not present

## 2015-08-10 DIAGNOSIS — I2510 Atherosclerotic heart disease of native coronary artery without angina pectoris: Secondary | ICD-10-CM | POA: Insufficient documentation

## 2015-08-10 DIAGNOSIS — C9140 Hairy cell leukemia not having achieved remission: Secondary | ICD-10-CM

## 2015-08-10 DIAGNOSIS — Z7982 Long term (current) use of aspirin: Secondary | ICD-10-CM | POA: Insufficient documentation

## 2015-08-10 DIAGNOSIS — N183 Chronic kidney disease, stage 3 (moderate): Secondary | ICD-10-CM

## 2015-08-10 DIAGNOSIS — R0609 Other forms of dyspnea: Secondary | ICD-10-CM | POA: Insufficient documentation

## 2015-08-10 DIAGNOSIS — R5383 Other fatigue: Secondary | ICD-10-CM | POA: Insufficient documentation

## 2015-08-10 DIAGNOSIS — J449 Chronic obstructive pulmonary disease, unspecified: Secondary | ICD-10-CM

## 2015-08-10 DIAGNOSIS — I129 Hypertensive chronic kidney disease with stage 1 through stage 4 chronic kidney disease, or unspecified chronic kidney disease: Secondary | ICD-10-CM | POA: Diagnosis not present

## 2015-08-10 DIAGNOSIS — Z951 Presence of aortocoronary bypass graft: Secondary | ICD-10-CM | POA: Insufficient documentation

## 2015-08-10 DIAGNOSIS — Z9221 Personal history of antineoplastic chemotherapy: Secondary | ICD-10-CM | POA: Diagnosis not present

## 2015-08-10 DIAGNOSIS — R5381 Other malaise: Secondary | ICD-10-CM | POA: Insufficient documentation

## 2015-08-10 DIAGNOSIS — K219 Gastro-esophageal reflux disease without esophagitis: Secondary | ICD-10-CM | POA: Diagnosis not present

## 2015-08-10 LAB — CBC WITH DIFFERENTIAL/PLATELET
Basophils Absolute: 0 10*3/uL (ref 0–0.1)
Basophils Relative: 1 %
EOS ABS: 0.2 10*3/uL (ref 0–0.7)
EOS PCT: 9 %
HCT: 30.4 % — ABNORMAL LOW (ref 40.0–52.0)
HEMOGLOBIN: 10 g/dL — AB (ref 13.0–18.0)
LYMPHS ABS: 0.3 10*3/uL — AB (ref 1.0–3.6)
Lymphocytes Relative: 12 %
MCH: 33.9 pg (ref 26.0–34.0)
MCHC: 32.9 g/dL (ref 32.0–36.0)
MCV: 103.1 fL — ABNORMAL HIGH (ref 80.0–100.0)
MONOS PCT: 25 %
Monocytes Absolute: 0.6 10*3/uL (ref 0.2–1.0)
NEUTROS PCT: 53 %
Neutro Abs: 1.2 10*3/uL — ABNORMAL LOW (ref 1.4–6.5)
Platelets: 143 10*3/uL — ABNORMAL LOW (ref 150–440)
RBC: 2.95 MIL/uL — ABNORMAL LOW (ref 4.40–5.90)
RDW: 15.5 % — ABNORMAL HIGH (ref 11.5–14.5)
WBC: 2.2 10*3/uL — ABNORMAL LOW (ref 3.8–10.6)

## 2015-08-10 LAB — COMPREHENSIVE METABOLIC PANEL
ALT: 11 U/L — ABNORMAL LOW (ref 17–63)
AST: 25 U/L (ref 15–41)
Albumin: 4.3 g/dL (ref 3.5–5.0)
Alkaline Phosphatase: 65 U/L (ref 38–126)
Anion gap: 4 — ABNORMAL LOW (ref 5–15)
BUN: 13 mg/dL (ref 6–20)
CALCIUM: 8.9 mg/dL (ref 8.9–10.3)
CHLORIDE: 105 mmol/L (ref 101–111)
CO2: 27 mmol/L (ref 22–32)
CREATININE: 1.33 mg/dL — AB (ref 0.61–1.24)
GFR, EST AFRICAN AMERICAN: 57 mL/min — AB (ref >60)
GFR, EST NON AFRICAN AMERICAN: 49 mL/min — AB (ref >60)
Glucose, Bld: 101 mg/dL — ABNORMAL HIGH (ref 65–99)
Potassium: 4.1 mmol/L (ref 3.5–5.1)
SODIUM: 136 mmol/L (ref 135–145)
Total Bilirubin: 1.2 mg/dL (ref 0.3–1.2)
Total Protein: 6.6 g/dL (ref 6.5–8.1)

## 2015-08-10 NOTE — Progress Notes (Signed)
[facility]  [phone] [phone]  ID: Tiny Rietz [place] OB: April 08, 1933  MR#: [id number]  XKG#:[id number]  Patient Care Team: Morton Peters., MD as PCP - General (Unknown Physician Specialty) [name], MD as Consulting Physician (Cardiology) [name], MD as Consulting Physician (Oncology)  CHIEF COMPLAINT:  Chief Complaint  Patient presents with  . hairy cell leukemia    INTERVAL HISTORY: Patient returns to clinic today for repeat laboratory work and further evaluation. He no longer planes of weakness and fatigue and feels back to his baseline. He does not complain of nausea today. He continues to have mild dyspnea on exertion, but no longer requires oxygen. He does not complain of chest pain today. He denies any fevers. He has no neurologic complaints. He denies any weight loss. He has no urinary complaints. Patient offers no specific complaints today.   REVIEW OF SYSTEMS:   Review of Systems  Constitutional: Positive for malaise/fatigue. Negative for fever.  Respiratory: Positive for shortness of breath.   Cardiovascular: Negative for chest pain.  Gastrointestinal: Negative for nausea and vomiting.  Musculoskeletal: Negative.   Neurological: Negative.     As per HPI. Otherwise, a complete review of systems is negatve.  PAST MEDICAL HISTORY: Past Medical History  Diagnosis Date  . Coronary artery disease     a. 2000 s/p CABG;  b. 10/2008 Neg MV, EF 64%; c. cath 05/2015: LM 85% sev cal, pLAD-1 lesion 80%, pLAD-2 lesion 100% chronic, ostLCx 70% sev cal, OM4 90%, ostRCA 70%, mid RCA 90%, LIMA-LAD patent, VG-OM2 patent, VG-OM3 patent, VG-RPDA patent. No AS  . BPH (benign prostatic hyperplasia)   . Hyperlipidemia   . Hairy cell leukemia ([place])   . H/O echocardiogram     a. 10/2005 Echo: nl EF.  [name] Atypical chest pain     a. 08/2014  . Asymptomatic Sinus Bradycardia   . Asthma   . Hypertension   . GERD  (gastroesophageal reflux disease)   . HTN (hypertension)   . CKD (chronic kidney disease), stage III   . COPD (chronic obstructive pulmonary disease) ([place])   . Shortness of breath dyspnea     PAST SURGICAL HISTORY: Past Surgical History  Procedure Laterality Date  . Coronary artery bypass graft  2000  . Cataract extraction    . Cardiac catheterization N/A 05/26/2015    Procedure: Left Heart Cath;  Surgeon: [name], MD;  Location: [facility];  Service: Cardiovascular;  Laterality: N/A;  . Cardiac catheterization N/A 05/26/2015    Procedure: Coronary/Graft Angiography;  Surgeon: [name], MD;  Location: [facility];  Service: Cardiovascular;  Laterality: N/A;    FAMILY HISTORY Family History  Problem Relation Age of Onset  . Other      no premature CAD.  [name] Hypertension Mother   . Arthritis Father        ADVANCED DIRECTIVES:    HEALTH MAINTENANCE: Social History  Substance Use Topics  . Smoking status: Never Smoker   . Smokeless tobacco: Never Used  . Alcohol Use: No     Colonoscopy:  PAP:  Bone density:  Lipid panel:  No Known Allergies  Current Outpatient Prescriptions  Medication Sig Dispense Refill  . acyclovir (ZOVIRAX) 400 MG tablet Take 400 mg by mouth 2 (two) times daily.  4  . aspirin 81 MG EC tablet Take 2 tablets (162 mg total) by mouth daily. 30 tablet 0  . atorvastatin (LIPITOR) 40 MG  tablet Take 20 mg by mouth daily.     . dapsone 100 MG tablet Take 1 tablet (100 mg total) by mouth daily. 30 tablet 2  . isosorbide mononitrate (IMDUR) 30 MG 24 hr tablet Take 1 tablet (30 mg total) by mouth daily. 30 tablet 3  . Multiple Vitamin (MULTIVITAMIN) tablet Take 1 tablet by mouth daily.      . prochlorperazine (COMPAZINE) 10 MG tablet Take 1 tablet (10 mg total) by mouth every 6 (six) hours as needed for nausea or vomiting. 30 tablet 0  . ranitidine (ZANTAC) 75 MG tablet Take 75 mg by mouth 2 (two) times daily.    .  tamsulosin (FLOMAX) 0.4 MG CAPS capsule Take 0.4 mg by mouth daily.    . pantoprazole (PROTONIX) 40 MG tablet Reported on 08/10/2015     No current facility-administered medications for this visit.   Facility-Administered Medications Ordered in Other Visits  Medication Dose Route Frequency Provider Last Rate Last Dose  . sodium chloride 0.9 % injection 10 mL  10 mL Intracatheter PRN [name], MD      . sodium chloride 0.9 % injection 10 mL  10 mL Intracatheter PRN [name], MD        OBJECTIVE: Filed Vitals:   08/10/15 1111  BP: 124/63  Pulse: 60  Temp: 98.2 F (36.8 C)  Resp: 16     Body mass index is 25.58 kg/(m^2).    ECOG FS:1 - Symptomatic but completely ambulatory  General: Well-developed, well-nourished, no acute distress. Eyes: anicteric sclera. Lungs: Clear to auscultation bilaterally. Heart: Regular rate and rhythm. No rubs, murmurs, or gallops. Abdomen: Soft, nontender, nondistended. No organomegaly noted, normoactive bowel sounds. Musculoskeletal: No edema, cyanosis, or clubbing. Neuro: Alert, answering all questions appropriately. Cranial nerves grossly intact. Skin: No rashes or petechiae noted. Psych: Normal affect.   LAB RESULTS:  Lab Results  Component Value Date   NA 136 08/10/2015   K 4.1 08/10/2015   CL 105 08/10/2015   CO2 27 08/10/2015   GLUCOSE 101* 08/10/2015   BUN 13 08/10/2015   CREATININE 1.33* 08/10/2015   CALCIUM 8.9 08/10/2015   PROT 6.6 08/10/2015   ALBUMIN 4.3 08/10/2015   AST 25 08/10/2015   ALT 11* 08/10/2015   ALKPHOS 65 08/10/2015   BILITOT 1.2 08/10/2015   GFRNONAA 49* 08/10/2015   GFRAA 57* 08/10/2015    Lab Results  Component Value Date   WBC 2.2* 08/10/2015   NEUTROABS 1.2* 08/10/2015   HGB 10.0* 08/10/2015   HCT 30.4* 08/10/2015   MCV 103.1* 08/10/2015   PLT 143* 08/10/2015     STUDIES: No results found.  ASSESSMENT:  Hairy cell leukemia.  PLAN:   1. Hairy cell leukemia: Patient  initially received cladribine in 2006 and then was treated for a relapse in 2011 and again in 2014. Bone marrow biopsy confirmed recurrence of disease with 80-90% hairy cell leukemia noted in his specimen. Patient recently completed cladribine 0.15 mg/kg on days 1 through 5 along with 8 weekly cycles of Rituxan on April 18, 2015. Bone marrow biopsy results after completion of 8 weeks of Rituxan reviewed independently without evidence of residual disease.  No further intervention is needed at this time. Patient does not require additional treatment. Return to clinic in 3 months for repeat laboratory work and further evaluation. Patient has been instructed to discontinue Valtrex and Bactrim prophylaxis, but may need to restart if another cycle of chemotherapy is planned. 2. Leukopenia: Patient's white blood  cell count has slightly declined, monitor. 3. Pancytopenia: Stable but for leukopenia, monitor.  4. Shortness breath:  Improved. Patient no longer requires supplemental oxygen. Patient had an arterial blood gas drawn on February 22, 2015 without supplemental oxygen that reported an oxygen saturation of 94% and a PO2 of 82. 5. Chest pain: Cardiac catheterization completed in October 2016. Treatment per cardiology.   Patient expressed understanding and was in agreement with this plan. He also understands that He can call clinic at any time with any questions, concerns, or complaints.     [name], MD   08/10/2015 3:06 PM

## 2015-08-10 NOTE — Progress Notes (Signed)
Patient is feeling well today and offers no problems.

## 2015-08-14 ENCOUNTER — Other Ambulatory Visit: Payer: Self-pay | Admitting: *Deleted

## 2015-08-14 MED ORDER — ISOSORBIDE MONONITRATE ER 30 MG PO TB24: 30.0000 mg | ORAL_TABLET | Freq: Every day | ORAL | Status: DC

## 2015-08-14 NOTE — Telephone Encounter (Signed)
Requested Prescriptions   Signed Prescriptions Disp Refills  . isosorbide mononitrate (IMDUR) 30 MG 24 hr tablet 90 tablet 3    Sig: Take 1 tablet (30 mg total) by mouth daily.    Authorizing Provider: Rise Mu    Ordering User: Britt Bottom

## 2015-09-01 ENCOUNTER — Ambulatory Visit (INDEPENDENT_AMBULATORY_CARE_PROVIDER_SITE_OTHER): Payer: 59 | Admitting: Cardiovascular Disease

## 2015-09-01 ENCOUNTER — Encounter: Payer: Self-pay | Admitting: Cardiovascular Disease

## 2015-09-01 VITALS — BP 120/62 | HR 57 | Ht 68.0 in | Wt 166.0 lb

## 2015-09-01 DIAGNOSIS — R079 Chest pain, unspecified: Secondary | ICD-10-CM | POA: Diagnosis not present

## 2015-09-01 DIAGNOSIS — E785 Hyperlipidemia, unspecified: Secondary | ICD-10-CM

## 2015-09-01 DIAGNOSIS — C9140 Hairy cell leukemia not having achieved remission: Secondary | ICD-10-CM | POA: Diagnosis not present

## 2015-09-01 DIAGNOSIS — D649 Anemia, unspecified: Secondary | ICD-10-CM | POA: Insufficient documentation

## 2015-09-01 DIAGNOSIS — I10 Essential (primary) hypertension: Secondary | ICD-10-CM | POA: Diagnosis not present

## 2015-09-01 DIAGNOSIS — I209 Angina pectoris, unspecified: Secondary | ICD-10-CM

## 2015-09-01 HISTORY — DX: Anemia, unspecified: D64.9

## 2015-09-01 NOTE — Progress Notes (Signed)
Patient ID: Frank Mcdaniel, male    DOB: 05-Aug-1933, 82 y.o.   MRN: [id number]  HPI Comments: 82 year old gentleman with a history of coronary artery disease, bypass in July 2000, hairy cell leukemia,  chronic anemia, history of hyperlipidemia.who presents for routine followup of his coronary artery disease.  He has chronic lower extremity edema, worse on the right than the left. admission to the hospital June 2016 for shortness of breath, had a echocardiogram stress test on no ischemia Diagnosed with asthma exacerbation Baseline hematocrit 31  In follow-up today, he reports that he continues to have very rare episodes of left-sided chest pain. Reports having chest pain sometimes when he pushes the garbage to the street. Does not take nitroglycerin Sometimes has pain at rest. Reports there is no consistency, very sporadic There has been no increase in frequency or duration of his pain. He does not want further workup at this time as it has not changed  Lab work reviewed with him showing total cholesterol 85, LDL 37. He's been taking Lipitor 20 ng daily  EKG on today's visit shows normal sinus rhythm with rate 61 bpm, right bundle branch block  Other past medical history  slipping on the ice in January 2016, possibly landed on his left arm, had chest pressure, described as a dullness in his left pectoral area. He went to the emergency room and evaluation showed negative cardiac enzymes, otherwise normal lab work including CBC, basic metabolic panel, EKG unchanged, chest x-ray normal. He was discharged home with follow-up today Possibly had one or 2 very short episodes of fleeting low-grade almost in the left chest. Again he wonders if it could be musculoskeletal from recent fall  Previous echocardiogram March 2007 was essentially normal Last stress test March 2010 was a lexiscan, ejection fraction 65%, no ischemia noted, no EKG changes Total cholesterol 118, LDL 56, HDL 54    No  Known Allergies  Outpatient Encounter Prescriptions as of 09/01/2015  Medication Sig  . acyclovir (ZOVIRAX) 400 MG tablet Take 400 mg by mouth 2 (two) times daily.  [name] aspirin 81 MG EC tablet Take 2 tablets (162 mg total) by mouth daily.  [name] atorvastatin (LIPITOR) 40 MG tablet Take 20 mg by mouth daily.   . dapsone 100 MG tablet Take 1 tablet (100 mg total) by mouth daily.  . isosorbide mononitrate (IMDUR) 30 MG 24 hr tablet Take 1 tablet (30 mg total) by mouth daily.  . Multiple Vitamin (MULTIVITAMIN) tablet Take 1 tablet by mouth daily.    . prochlorperazine (COMPAZINE) 10 MG tablet Take 1 tablet (10 mg total) by mouth every 6 (six) hours as needed for nausea or vomiting.  . ranitidine (ZANTAC) 75 MG tablet Take 75 mg by mouth 2 (two) times daily.  . tamsulosin (FLOMAX) 0.4 MG CAPS capsule Take 0.4 mg by mouth daily.  . [DISCONTINUED] pantoprazole (PROTONIX) 40 MG tablet Reported on 09/01/2015   Facility-Administered Encounter Medications as of 09/01/2015  Medication  . sodium chloride 0.9 % injection 10 mL  . sodium chloride 0.9 % injection 10 mL    Past Medical History  Diagnosis Date  . Coronary artery disease     a. 2000 s/p CABG;  b. 10/2008 Neg MV, EF 64%; c. cath 05/2015: LM 85% sev cal, pLAD-1 lesion 80%, pLAD-2 lesion 100% chronic, ostLCx 70% sev cal, OM4 90%, ostRCA 70%, mid RCA 90%, LIMA-LAD patent, VG-OM2 patent, VG-OM3 patent, VG-RPDA patent. No AS  . BPH (benign prostatic hyperplasia)   .  Hyperlipidemia   . Hairy cell leukemia ([place])   . H/O echocardiogram     a. 10/2005 Echo: nl EF.  [name] Atypical chest pain     a. 08/2014  . Asymptomatic Sinus Bradycardia   . Asthma   . Hypertension   . GERD (gastroesophageal reflux disease)   . HTN (hypertension)   . CKD (chronic kidney disease), stage III   . COPD (chronic obstructive pulmonary disease) ([place])   . Shortness of breath dyspnea     Past Surgical History  Procedure Laterality Date  . Coronary artery bypass graft  2000   . Cataract extraction    . Cardiac catheterization N/A 05/26/2015    Procedure: Left Heart Cath;  Surgeon: [name], MD;  Location: [facility];  Service: Cardiovascular;  Laterality: N/A;  . Cardiac catheterization N/A 05/26/2015    Procedure: Coronary/Graft Angiography;  Surgeon: [name], MD;  Location: [facility];  Service: Cardiovascular;  Laterality: N/A;    Social History  reports that he has never smoked. He has never used smokeless tobacco. He reports that he does not drink alcohol or use illicit drugs.  Family History family history includes Arthritis in his father; Hypertension in his mother.       Review of Systems  Constitutional: Negative.   Eyes: Negative.   Respiratory: Positive for chest tightness.   Cardiovascular: Negative.   Gastrointestinal: Negative.   Musculoskeletal: Negative.   Neurological: Negative.   Hematological: Negative.   Psychiatric/Behavioral: Negative.   All other systems reviewed and are negative.  BP 120/62 mmHg  Pulse 57  Ht 5\' 8"  (1.727 m)  Wt 166 lb (75.297 kg)  BMI 25.25 kg/m2  Physical Exam  Constitutional: He is oriented to person, place, and time. He appears well-developed and well-nourished.  HENT:  Head: Normocephalic.  Nose: Nose normal.  Mouth/Throat: Oropharynx is clear and moist.  Eyes: Conjunctivae are normal. Pupils are equal, round, and reactive to light.  Neck: Normal range of motion. Neck supple. No JVD present.  Cardiovascular: Normal rate, regular rhythm, S1 normal, S2 normal, normal heart sounds and intact distal pulses.  Exam reveals no gallop and no friction rub.   No murmur heard. Pulmonary/Chest: Effort normal and breath sounds normal. No respiratory distress. He has no wheezes. He has no rales. He exhibits no tenderness.  Abdominal: Soft. Bowel sounds are normal. He exhibits no distension. There is no tenderness.  Musculoskeletal: Normal range of motion. He exhibits no  edema or tenderness.  Lymphadenopathy:    He has no cervical adenopathy.  Neurological: He is alert and oriented to person, place, and time. Coordination normal.  Skin: Skin is warm and dry. No rash noted. No erythema.  Psychiatric: He has a normal mood and affect. His behavior is normal. Judgment and thought content normal.      Assessment and Plan   Nursing note and vitals reviewed.

## 2015-09-01 NOTE — Assessment & Plan Note (Signed)
Hematocrit stable at 31. He is followed by hematology/oncology

## 2015-09-01 NOTE — Patient Instructions (Addendum)
You are doing well. No medication changes were made.  Please call the office if chest pain gets worse of more frequent  Phone number for [facility]:  [phone]  Please call us if you have new issues that need to be addressed before your next appt.  Your physician wants you to follow-up in: 6 months.  You will receive a reminder letter in the mail two months in advance. If you don't receive a letter, please call our office to schedule the follow-up appointment.

## 2015-09-01 NOTE — Assessment & Plan Note (Signed)
Stable count. Anemia may be contributing to his symptoms of stable angina

## 2015-09-01 NOTE — Assessment & Plan Note (Signed)
Blood pressure is well controlled on today's visit. No changes made to the medications. 

## 2015-09-01 NOTE — Assessment & Plan Note (Signed)
Recommended we recheck his lipid panel on his next clinic visit. If cholesterol continues to run very low, could potentially decrease Lipitor down to 10 mg daily

## 2015-09-01 NOTE — Assessment & Plan Note (Signed)
Long discussion concerning his angina symptoms. He has stable, often reproducible angina, improved symptoms on isosorbide. He does not think that he needs to change his medications as symptoms are stable. Does not want additional testing at this time.   Total encounter time more than 25 minutes  Greater than 50% was spent in counseling and coordination of care with the patient

## 2015-09-11 ENCOUNTER — Ambulatory Visit (INDEPENDENT_AMBULATORY_CARE_PROVIDER_SITE_OTHER): Payer: 59 | Admitting: Family Medicine

## 2015-09-11 ENCOUNTER — Encounter: Payer: Self-pay | Admitting: Family Medicine

## 2015-09-11 VITALS — BP 112/66 | HR 71 | Temp 98.2°F | Ht 68.0 in | Wt 165.8 lb

## 2015-09-11 DIAGNOSIS — I209 Angina pectoris, unspecified: Secondary | ICD-10-CM | POA: Diagnosis not present

## 2015-09-11 DIAGNOSIS — K219 Gastro-esophageal reflux disease without esophagitis: Secondary | ICD-10-CM

## 2015-09-11 DIAGNOSIS — N400 Enlarged prostate without lower urinary tract symptoms: Secondary | ICD-10-CM | POA: Diagnosis not present

## 2015-09-11 DIAGNOSIS — F419 Anxiety disorder, unspecified: Secondary | ICD-10-CM

## 2015-09-11 HISTORY — DX: Anxiety disorder, unspecified: F41.9

## 2015-09-11 NOTE — Assessment & Plan Note (Signed)
Patient reports stable symptoms on Flomax. Mild symptoms based on IPSS score. Patient would prefer to continue to monitor this rather than try additional medications or urology referral. Burnis Medin continue to monitor. Given return precautions.

## 2015-09-11 NOTE — Assessment & Plan Note (Signed)
Stable on Zantac. Benign abdominal exam today. Continue current therapy. Given return precautions.

## 2015-09-11 NOTE — Progress Notes (Signed)
Pre visit review using our clinic review tool, if applicable. No additional management support is needed unless otherwise documented below in the visit note. 

## 2015-09-11 NOTE — Progress Notes (Signed)
Patient ID: Frank Mcdaniel, male   DOB: 18-Apr-1933, 82 y.o.   MRN: [id number]  Frank Rumps, MD Phone: [phone]  [name] is a 82 y.o. male who presents today for new patient visit.  Patient reports no specific complaints today. He presents to establish care.  Patient notes a history of CABG, coronary artery disease, and chest pain. Notes he is followed by Dr. Rockey Situ for this. Patient recently saw Dr. Rockey Situ. Patient does note he gets intermittent left-sided chest discomfort. He reports this is a mild aching that is sometimes reproducible. He notes this does not get worse with exertion. No shortness of breath or diaphoresis or radiation. Patient feels it might be related to anxiety. States he was told by Dr. Rockey Situ there is a small artery that was blocked that they could not stent. He was placed on Imdur and has had improvement with this. Patient does not want further workup. EKG from cardiologist's office showed normal sinus rhythm with rate of 61, right bundle-branch block. No chest pain at this time.  Anxiety: Patient notes he gets anxious depending on what is going on. If something out of the ordinary occurs he will develop some anxiety. Notes this has been going over the last 2-3 years. States he is a worry wart. No depression.  Urinary frequency: Patient notes this has been going on for a long time. Sometimes he can go about every hour, though this does not occur all the time. He goes 2-3 times at night. He is on Flomax. He has been on this for years. He is unsure if this is helpful. IPSS score of 4 mostly satisfied.  GERD: Patient notes history of reflux and gastritis. He notes in the past he had sensation of food getting stuck and he was seen by GI and had an EGD and had a dilation done. Notes minimal nausea from time to time for which she takes Zantac and that resolves his symptoms. No abdominal pain. He has previously been on Protonix and Prilosec. States while on  Protonix he felt tired and weak and stopped this medication and those symptoms improved. Occasionally he will take Tums. He does not use NSAIDs. He is followed by GI for this.  Active Ambulatory Problems    Diagnosis Date Noted  . Hyperlipidemia 10/27/2009  . S/P CABG (coronary artery bypass graft) 11/08/2010  . HTN (hypertension) 11/08/2010  . Bradycardia 11/08/2010  . Chest pain 09/02/2014  . Coronary artery disease   . Asthma 01/28/2015  . Acute respiratory failure with hypoxemia ([place]) 01/28/2015  . BPH (benign prostatic hyperplasia) 01/28/2015  . Hairy cell leukemia ([place]) 01/28/2015  . Hypoxia 02/01/2015  . Angina pectoris ([place]) 05/26/2015  . Hairy cell leukemia not having achieved remission ([place])   . GERD (gastroesophageal reflux disease) 06/02/2015  . Anemia 09/01/2015  . Anxiety 09/11/2015   Resolved Ambulatory Problems    Diagnosis Date Noted  . No Resolved Ambulatory Problems   Past Medical History  Diagnosis Date  . H/O echocardiogram   . Atypical chest pain   . Hypertension   . CKD (chronic kidney disease), stage III   . COPD (chronic obstructive pulmonary disease) ([place])   . Shortness of breath dyspnea     Family History  Problem Relation Age of Onset  . Other      no premature CAD.  [name] Hypertension Mother   . Arthritis Father     Social History   Social History  . Marital Status: Widowed  Spouse Name: N/A  . Number of Children: N/A  . Years of Education: N/A   Occupational History  . Not on file.   Social History Main Topics  . Smoking status: Never Smoker   . Smokeless tobacco: Never Used  . Alcohol Use: No  . Drug Use: No  . Sexual Activity: Not on file   Other Topics Concern  . Not on file   Social History Narrative   Lives locally with son.  Fairly active around the house though does not routinely exercise.  Retired from [employer].    ROS   General:  Negative for nexplained weight loss, fever Skin: Negative for new or changing mole,  sore that won't heal HEENT: Negative for trouble hearing, trouble seeing, ringing in ears, mouth sores, hoarseness, change in voice, dysphagia. CV:  Positive for chest pain (recently saw cardiology, no chest pain at this time) Negative for dyspnea, edema, palpitations Resp: Negative for cough, dyspnea, hemoptysis GI: Positive for nausea, Negative for vomiting, diarrhea, constipation, abdominal pain, melena, hematochezia. GU: Positive for frequent urination, Negative for dysuria, incontinence, urinary hesitance, hematuria, vaginal or penile discharge, polyuria, sexual difficulty, lumps in testicle or breasts MSK: Negative for muscle cramps or aches, joint pain or swelling Neuro: Negative for headaches, weakness, numbness, dizziness, passing out/fainting Psych: Positive for anxiety, Negative for depression, memory problems  Objective  Physical Exam Filed Vitals:   09/11/15 0921  BP: 112/66  Pulse: 71  Temp: 98.2 F (36.8 C)   Physical Exam  Constitutional: He is well-developed, well-nourished, and in no distress.  HENT:  Head: Normocephalic and atraumatic.  Right Ear: External ear normal.  Left Ear: External ear normal.  Mouth/Throat: Oropharynx is clear and moist. No oropharyngeal exudate.  Eyes: Pupils are equal, round, and reactive to light.  Neck: Neck supple.  Cardiovascular: Normal rate, regular rhythm and normal heart sounds.  Exam reveals no gallop and no friction rub.   No murmur heard. Pulmonary/Chest: Effort normal and breath sounds normal. No respiratory distress. He has no wheezes. He has no rales.  Abdominal: Soft. Bowel sounds are normal. He exhibits no distension. There is no tenderness. There is no rebound and no guarding.  Musculoskeletal: He exhibits no edema.  Lymphadenopathy:    He has no cervical adenopathy.  Neurological: He is alert. Gait normal.  Skin: Skin is warm and dry. He is not diaphoretic.  Psychiatric: Mood and affect normal.      Assessment/Plan:   Angina pectoris ([place]) Symptoms have been stable for some time. Have improved with isosorbide. EKG from cardiology office reviewed. No symptoms at this time. Patient does not wish for any additional testing at this time. Does not desire any medication changes either. He will continue to follow with cardiology for this issue. Given return precautions.  Anxiety Low-level minimal anxiety relating to variation from typical pattern of events. Discussed possible treatments. Patient would like to continue to monitor. Given return precautions.  BPH (benign prostatic hyperplasia) Patient reports stable symptoms on Flomax. Mild symptoms based on IPSS score. Patient would prefer to continue to monitor this rather than try additional medications or urology referral. Burnis Medin continue to monitor. Given return precautions.  GERD (gastroesophageal reflux disease) Stable on Zantac. Benign abdominal exam today. Continue current therapy. Given return precautions.    Frank Mcdaniel

## 2015-09-11 NOTE — Patient Instructions (Signed)
Nice to meet you. Please continue to monitor your chest pain and if this changes please redevelop new symptoms please seek medical attention. Please monitor your anxiety and if you decide joint treatment for this please let us know. Please continue the Zantac for reflux. If you develop any symptoms or worsening reflux or nausea please let us know. Please monitor urinary frequency. If you want further intervention for this please let us know. If you develop chest pain, shortness breath, sweating, depression, worsening anxiety, abdominal pain, nausea, vomiting, diarrhea, burning with urination, or any new or changing symptoms please seek medical attention.

## 2015-09-11 NOTE — Assessment & Plan Note (Signed)
Symptoms have been stable for some time. Have improved with isosorbide. EKG from cardiology office reviewed. No symptoms at this time. Patient does not wish for any additional testing at this time. Does not desire any medication changes either. He will continue to follow with cardiology for this issue. Given return precautions.

## 2015-09-11 NOTE — Assessment & Plan Note (Signed)
Low-level minimal anxiety relating to variation from typical pattern of events. Discussed possible treatments. Patient would like to continue to monitor. Given return precautions.

## 2015-10-26 ENCOUNTER — Encounter: Payer: Self-pay | Admitting: Family Medicine

## 2015-10-26 ENCOUNTER — Ambulatory Visit (INDEPENDENT_AMBULATORY_CARE_PROVIDER_SITE_OTHER): Payer: 59 | Admitting: Family Medicine

## 2015-10-26 VITALS — BP 128/68 | HR 64 | Temp 98.0°F | Wt 167.5 lb

## 2015-10-26 DIAGNOSIS — E785 Hyperlipidemia, unspecified: Secondary | ICD-10-CM | POA: Diagnosis not present

## 2015-10-26 DIAGNOSIS — N400 Enlarged prostate without lower urinary tract symptoms: Secondary | ICD-10-CM

## 2015-10-26 DIAGNOSIS — R7981 Abnormal blood-gas level: Secondary | ICD-10-CM

## 2015-10-26 MED ORDER — TAMSULOSIN HCL 0.4 MG PO CAPS: 0.4000 mg | ORAL_CAPSULE | Freq: Every day | ORAL | Status: DC

## 2015-10-26 MED ORDER — ATORVASTATIN CALCIUM 40 MG PO TABS: 20.0000 mg | ORAL_TABLET | Freq: Every day | ORAL | Status: DC

## 2015-10-26 NOTE — Progress Notes (Signed)
Pre visit review using our clinic review tool, if applicable. No additional management support is needed unless otherwise documented below in the visit note. 

## 2015-10-26 NOTE — Assessment & Plan Note (Signed)
Tolerating medication. We'll refill Lipitor. Last lipid panel reviewed from June 2016.

## 2015-10-26 NOTE — Assessment & Plan Note (Signed)
Patient is asymptomatic with normal lung exam. 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 He will continue to monitor. If develops symptoms will seek medical attention. Given return precautions.

## 2015-10-26 NOTE — Assessment & Plan Note (Signed)
I PSS score of 11 with mostly satisfied. Discussed possible changes in medication management to improve his symptoms though he wants to stick with his current medication as this does not bother him very much. He'll continue to monitor.

## 2015-10-26 NOTE — Progress Notes (Signed)
Patient ID: Frank Mcdaniel, male   DOB: [birth year], 82 y.o.   MRN: [id number]  [name], MD Phone: [phone]  Frank Mcdaniel is a 82 y.o. male who presents today for follow-up.  HYPERLIPIDEMIA Symptoms Chest pain on exertion:  No   Leg claudication:   No Medications: Compliance- taking Lipitor 20 mg daily Right upper quadrant pain- no  Muscle aches- no  BPH: Patient takes Flomax daily. Notes nocturia 2-3 times per night. He goes about 6 times during the day. No flow issues. Some urgency issues. States he is mostly satisfied with his symptoms.  Borderline low oxygen levels are reported to be a chronic issue for the patient due to his dapsone. Notes he has had workup of this with arterial oxygen saturation checked that was normal. He denies any shortness of breath, chest pain, or any new issues. States he feels well overall. Oxygen levels on pulse ox are typically in the low 90s. He's been followed by pulmonology for this.  PMH: nonsmoker.   ROS see history of present illness  Objective  Physical Exam Filed Vitals:   10/26/15 0859  BP: 128/68  Pulse: 64  Temp: 98 F (36.7 C)    BP Readings from Last 3 Encounters:  10/26/15 128/68  09/11/15 112/66  09/01/15 120/62   Wt Readings from Last 3 Encounters:  10/26/15 167 lb 8 oz (75.978 kg)  09/11/15 165 lb 12.8 oz (75.206 kg)  09/01/15 166 lb (75.297 kg)    Physical Exam  Constitutional: He is well-developed, well-nourished, and in no distress.  HENT:  Head: Normocephalic and atraumatic.  Cardiovascular: Normal rate, regular rhythm and normal heart sounds.  Exam reveals no gallop and no friction rub.   No murmur heard. Pulmonary/Chest: Effort normal and breath sounds normal. No respiratory distress. He has no wheezes. He has no rales.  Genitourinary: Prostate normal.  Musculoskeletal: He exhibits no edema.  Neurological: He is alert. Gait normal.  Skin: Skin is warm and dry. He is not diaphoretic.       Assessment/Plan: Please see individual problem list.  BPH (benign prostatic hyperplasia) I PSS score of 11 with mostly satisfied. Discussed possible changes in medication management to improve his symptoms though he wants to stick with his current medication as this does not bother him very much. He'll continue to monitor.  Hyperlipidemia Tolerating medication. We'll refill Lipitor. Last lipid panel reviewed from June 2016.  Borderline low oxygen saturation level Patient is asymptomatic with normal lung exam. 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 He will continue to monitor. If develops symptoms will seek medical attention. Given return precautions.    No orders of the defined types were placed in this encounter.    Meds ordered this encounter  Medications  . tamsulosin (FLOMAX) 0.4 MG CAPS capsule    Sig: Take 1 capsule (0.4 mg total) by mouth daily.    Dispense:  30 capsule    Refill:  3  . atorvastatin (LIPITOR) 40 MG tablet    Sig: Take 0.5 tablets (20 mg total) by mouth daily.    Dispense:  45 tablet    Refill:  [place], MD [place]

## 2015-10-26 NOTE — Patient Instructions (Signed)
Nice to see you. We will refill your Flomax and Lipitor. If you develop chest pain, shortness of breath, or any new or changing symptoms please seek medical attention.

## 2015-11-08 ENCOUNTER — Inpatient Hospital Stay (HOSPITAL_BASED_OUTPATIENT_CLINIC_OR_DEPARTMENT_OTHER): Payer: Medicare Other | Admitting: Oncology

## 2015-11-08 ENCOUNTER — Inpatient Hospital Stay: Payer: Medicare Other | Attending: Oncology

## 2015-11-08 VITALS — BP 119/59 | HR 55 | Temp 97.3°F | Resp 16 | Wt 163.8 lb

## 2015-11-08 DIAGNOSIS — C9141 Hairy cell leukemia, in remission: Secondary | ICD-10-CM | POA: Diagnosis not present

## 2015-11-08 DIAGNOSIS — Z79899 Other long term (current) drug therapy: Secondary | ICD-10-CM | POA: Diagnosis not present

## 2015-11-08 DIAGNOSIS — I2510 Atherosclerotic heart disease of native coronary artery without angina pectoris: Secondary | ICD-10-CM | POA: Insufficient documentation

## 2015-11-08 DIAGNOSIS — E785 Hyperlipidemia, unspecified: Secondary | ICD-10-CM

## 2015-11-08 DIAGNOSIS — N400 Enlarged prostate without lower urinary tract symptoms: Secondary | ICD-10-CM | POA: Diagnosis not present

## 2015-11-08 DIAGNOSIS — Z7982 Long term (current) use of aspirin: Secondary | ICD-10-CM | POA: Diagnosis not present

## 2015-11-08 DIAGNOSIS — J449 Chronic obstructive pulmonary disease, unspecified: Secondary | ICD-10-CM | POA: Insufficient documentation

## 2015-11-08 DIAGNOSIS — I129 Hypertensive chronic kidney disease with stage 1 through stage 4 chronic kidney disease, or unspecified chronic kidney disease: Secondary | ICD-10-CM | POA: Insufficient documentation

## 2015-11-08 DIAGNOSIS — N183 Chronic kidney disease, stage 3 (moderate): Secondary | ICD-10-CM | POA: Diagnosis not present

## 2015-11-08 DIAGNOSIS — D61818 Other pancytopenia: Secondary | ICD-10-CM

## 2015-11-08 DIAGNOSIS — Z951 Presence of aortocoronary bypass graft: Secondary | ICD-10-CM | POA: Insufficient documentation

## 2015-11-08 DIAGNOSIS — C9140 Hairy cell leukemia not having achieved remission: Secondary | ICD-10-CM

## 2015-11-08 DIAGNOSIS — Z9221 Personal history of antineoplastic chemotherapy: Secondary | ICD-10-CM | POA: Diagnosis not present

## 2015-11-08 DIAGNOSIS — K219 Gastro-esophageal reflux disease without esophagitis: Secondary | ICD-10-CM | POA: Insufficient documentation

## 2015-11-08 DIAGNOSIS — F419 Anxiety disorder, unspecified: Secondary | ICD-10-CM | POA: Diagnosis not present

## 2015-11-08 LAB — COMPREHENSIVE METABOLIC PANEL
ALBUMIN: 4.4 g/dL (ref 3.5–5.0)
ALT: 15 U/L — ABNORMAL LOW (ref 17–63)
ANION GAP: 3 — AB (ref 5–15)
AST: 35 U/L (ref 15–41)
Alkaline Phosphatase: 53 U/L (ref 38–126)
BILIRUBIN TOTAL: 1.4 mg/dL — AB (ref 0.3–1.2)
BUN: 20 mg/dL (ref 6–20)
CALCIUM: 8.8 mg/dL — AB (ref 8.9–10.3)
CO2: 25 mmol/L (ref 22–32)
Chloride: 110 mmol/L (ref 101–111)
Creatinine, Ser: 1.38 mg/dL — ABNORMAL HIGH (ref 0.61–1.24)
GFR calc non Af Amer: 46 mL/min — ABNORMAL LOW (ref >60)
GFR, EST AFRICAN AMERICAN: 54 mL/min — AB (ref >60)
GLUCOSE: 97 mg/dL (ref 65–99)
POTASSIUM: 3.9 mmol/L (ref 3.5–5.1)
Sodium: 138 mmol/L (ref 135–145)
TOTAL PROTEIN: 6.8 g/dL (ref 6.5–8.1)

## 2015-11-08 LAB — CBC WITH DIFFERENTIAL/PLATELET
BASOS PCT: 1 %
Basophils Absolute: 0 10*3/uL (ref 0–0.1)
EOS ABS: 0.2 10*3/uL (ref 0–0.7)
Eosinophils Relative: 5 %
HEMATOCRIT: 30.8 % — AB (ref 40.0–52.0)
Hemoglobin: 10.4 g/dL — ABNORMAL LOW (ref 13.0–18.0)
LYMPHS ABS: 0.4 10*3/uL — AB (ref 1.0–3.6)
Lymphocytes Relative: 9 %
MCH: 35.6 pg — AB (ref 26.0–34.0)
MCHC: 33.8 g/dL (ref 32.0–36.0)
MCV: 105.3 fL — ABNORMAL HIGH (ref 80.0–100.0)
MONO ABS: 0.6 10*3/uL (ref 0.2–1.0)
MONOS PCT: 14 %
NEUTROS ABS: 3 10*3/uL (ref 1.4–6.5)
Neutrophils Relative %: 71 %
Platelets: 127 10*3/uL — ABNORMAL LOW (ref 150–440)
RBC: 2.92 MIL/uL — ABNORMAL LOW (ref 4.40–5.90)
RDW: 14.1 % (ref 11.5–14.5)
WBC: 4.2 10*3/uL (ref 3.8–10.6)

## 2015-11-08 NOTE — Progress Notes (Signed)
Patient does not offer any problems today.  

## 2015-11-08 NOTE — Progress Notes (Signed)
[name]  [phone] [phone]  ID: Frank Mcdaniel [place] OB: 1933/03/01  MR#: [id number]  JTT#:[id number]  Patient Care Team: [name], MD as PCP - General (Family Medicine) [name], MD as Consulting Physician (Cardiology) [name], MD as Consulting Physician (Oncology)  CHIEF COMPLAINT:  Chief Complaint  Patient presents with  . Leukemia    INTERVAL HISTORY: Patient returns to clinic today for repeat laboratory work and further evaluation. He feels well and has no complaints today. He denies weakness or fatigue. He denies nausea, vomiting, diarrhea or constipation. He no longer has shortness of breath. He does not complain of chest pain today. He denies any fevers. He has no neurologic complaints. He denies any weight loss. He has no urinary complaints. Patient offers no specific complaints today.   REVIEW OF SYSTEMS:   Review of Systems  Constitutional: Negative for fever and malaise/fatigue.  Cardiovascular: Negative for chest pain.  Gastrointestinal: Negative for nausea and vomiting.  Musculoskeletal: Negative.   Neurological: Negative.     As per HPI. Otherwise, a complete review of systems is negatve.  PAST MEDICAL HISTORY: Past Medical History  Diagnosis Date  . Coronary artery disease     a. 2000 s/p CABG;  b. 10/2008 Neg MV, EF 64%; c. cath 05/2015: LM 85% sev cal, pLAD-1 lesion 80%, pLAD-2 lesion 100% chronic, ostLCx 70% sev cal, OM4 90%, ostRCA 70%, mid RCA 90%, LIMA-LAD patent, VG-OM2 patent, VG-OM3 patent, VG-RPDA patent. No AS  . BPH (benign prostatic hyperplasia)   . Hyperlipidemia   . Hairy cell leukemia ([place])   . H/O echocardiogram     a. 10/2005 Echo: nl EF.  [name] Atypical chest pain     a. 08/2014  . Asymptomatic Sinus Bradycardia   . Asthma   . Hypertension   . GERD (gastroesophageal reflux disease)   . HTN (hypertension)   . CKD (chronic kidney disease), stage III   . COPD (chronic  obstructive pulmonary disease) ([place])   . Shortness of breath dyspnea   . Anemia 09/01/2015  . Anxiety 09/11/2015    PAST SURGICAL HISTORY: Past Surgical History  Procedure Laterality Date  . Coronary artery bypass graft  2000  . Cataract extraction    . Cardiac catheterization N/A 05/26/2015    Procedure: Left Heart Cath;  Surgeon: [name], MD;  Location: [facility];  Service: Cardiovascular;  Laterality: N/A;  . Cardiac catheterization N/A 05/26/2015    Procedure: Coronary/Graft Angiography;  Surgeon: [name], MD;  Location: [facility];  Service: Cardiovascular;  Laterality: N/A;  . Tonsillectomy  1942     FAMILY HISTORY Family History  Problem Relation Age of Onset  . Other      no premature CAD.  [name] Hypertension Mother   . Arthritis Father        ADVANCED DIRECTIVES:    HEALTH MAINTENANCE: Social History  Substance Use Topics  . Smoking status: Never Smoker   . Smokeless tobacco: Never Used  . Alcohol Use: No     No Known Allergies  Current Outpatient Prescriptions  Medication Sig Dispense Refill  . acyclovir (ZOVIRAX) 400 MG tablet Take 400 mg by mouth 2 (two) times daily.  4  . aspirin 81 MG EC tablet Take 2 tablets (162 mg total) by mouth daily. 30 tablet 0  . atorvastatin (LIPITOR) 40 MG tablet Take 0.5 tablets (20 mg total) by mouth daily. 45 tablet 3  . dapsone  100 MG tablet Take 1 tablet (100 mg total) by mouth daily. 30 tablet 2  . isosorbide mononitrate (IMDUR) 30 MG 24 hr tablet Take 1 tablet (30 mg total) by mouth daily. 90 tablet 3  . Multiple Vitamin (MULTIVITAMIN) tablet Take 1 tablet by mouth daily.      . prochlorperazine (COMPAZINE) 10 MG tablet Take 1 tablet (10 mg total) by mouth every 6 (six) hours as needed for nausea or vomiting. 30 tablet 0  . ranitidine (ZANTAC) 75 MG tablet Take 75 mg by mouth 2 (two) times daily.    . tamsulosin (FLOMAX) 0.4 MG CAPS capsule Take 1 capsule (0.4 mg total) by mouth daily.  30 capsule 3   No current facility-administered medications for this visit.   Facility-Administered Medications Ordered in Other Visits  Medication Dose Route Frequency Provider Last Rate Last Dose  . sodium chloride 0.9 % injection 10 mL  10 mL Intracatheter PRN [name], MD      . sodium chloride 0.9 % injection 10 mL  10 mL Intracatheter PRN [name], MD        OBJECTIVE: Filed Vitals:   11/08/15 0855  BP: 119/59  Pulse: 55  Temp: 97.3 F (36.3 C)  Resp: 16     Body mass index is 24.91 kg/(m^2).    ECOG FS:1 - Symptomatic but completely ambulatory  General: Well-developed, well-nourished, no acute distress. Eyes: anicteric sclera. Lungs: Clear to auscultation bilaterally. Heart: Regular rate and rhythm. No rubs, murmurs, or gallops. Abdomen: Soft, nontender, nondistended. No organomegaly noted, normoactive bowel sounds. Musculoskeletal: No edema, cyanosis, or clubbing. Neuro: Alert, answering all questions appropriately. Cranial nerves grossly intact. Skin: No rashes or petechiae noted. Psych: Normal affect.   LAB RESULTS:  Lab Results  Component Value Date   NA 138 11/08/2015   K 3.9 11/08/2015   CL 110 11/08/2015   CO2 25 11/08/2015   GLUCOSE 97 11/08/2015   BUN 20 11/08/2015   CREATININE 1.38* 11/08/2015   CALCIUM 8.8* 11/08/2015   PROT 6.8 11/08/2015   ALBUMIN 4.4 11/08/2015   AST 35 11/08/2015   ALT 15* 11/08/2015   ALKPHOS 53 11/08/2015   BILITOT 1.4* 11/08/2015   GFRNONAA 46* 11/08/2015   GFRAA 54* 11/08/2015    Lab Results  Component Value Date   WBC 4.2 11/08/2015   NEUTROABS 3.0 11/08/2015   HGB 10.4* 11/08/2015   HCT 30.8* 11/08/2015   MCV 105.3* 11/08/2015   PLT 127* 11/08/2015     STUDIES: No results found.  ASSESSMENT:  Hairy cell leukemia.  PLAN:   1. Hairy cell leukemia: Patient initially received cladribine in 2006 and then was treated for a relapse in 2011 and again in 2014. Bone marrow biopsy confirmed  recurrence of disease with 80-90% hairy cell leukemia noted in his specimen. Patient recently completed cladribine 0.15 mg/kg on days 1 through 5 along with 8 weekly cycles of Rituxan on April 18, 2015. Bone marrow biopsy results after completion of 8 weeks of Rituxan without evidence of residual disease.  No further intervention is needed at this time. Patient does not require additional treatment. Return to clinic in 3 months for repeat laboratory work and further evaluation. Patient has been instructed to discontinue Valtrex and Bactrim prophylaxis, but may need to restart if another cycle of chemotherapy is planned. 2. Leukopenia: Patient's white blood cell count is WNL today, monitor. 3. Pancytopenia: Stable. HGB 10.4, PLT 127 Monitor.  Patient expressed understanding and was in agreement with  this plan. He also understands that He can call clinic at any time with any questions, concerns, or complaints.    Mayra Reel, NP   11/08/2015 9:33 AM  Patient was seen and evaluated independently and I agree with the assessment and plan as dictated above.  [name], MD 11/09/2015 11:40 PM

## 2015-11-10 ENCOUNTER — Ambulatory Visit: Payer: 59 | Admitting: Family Medicine

## 2015-11-13 ENCOUNTER — Other Ambulatory Visit: Payer: Self-pay | Admitting: *Deleted

## 2015-11-13 MED ORDER — ACYCLOVIR 400 MG PO TABS: 400.0000 mg | ORAL_TABLET | Freq: Two times a day (BID) | ORAL | Status: DC

## 2015-11-20 ENCOUNTER — Encounter: Payer: Self-pay | Admitting: *Deleted

## 2015-12-21 ENCOUNTER — Ambulatory Visit (INDEPENDENT_AMBULATORY_CARE_PROVIDER_SITE_OTHER): Payer: 59 | Admitting: Family Medicine

## 2015-12-21 ENCOUNTER — Encounter: Payer: Self-pay | Admitting: Family Medicine

## 2015-12-21 ENCOUNTER — Ambulatory Visit
Admission: RE | Admit: 2015-12-21 | Discharge: 2015-12-21 | Disposition: A | Discharge status: Home / Self Care | Payer: 59 | Source: Ambulatory Visit | Attending: Family Medicine | Admitting: Family Medicine

## 2015-12-21 VITALS — BP 112/58 | HR 68 | Temp 99.1°F | Ht 68.0 in

## 2015-12-21 DIAGNOSIS — R5382 Chronic fatigue, unspecified: Secondary | ICD-10-CM | POA: Diagnosis not present

## 2015-12-21 DIAGNOSIS — M545 Low back pain, unspecified: Secondary | ICD-10-CM

## 2015-12-21 DIAGNOSIS — M5136 Other intervertebral disc degeneration, lumbar region: Secondary | ICD-10-CM | POA: Diagnosis not present

## 2015-12-21 LAB — CBC WITH DIFFERENTIAL/PLATELET
BASOS PCT: 0.3 % (ref 0.0–3.0)
Basophils Absolute: 0 10*3/uL (ref 0.0–0.1)
EOS PCT: 2 % (ref 0.0–5.0)
Eosinophils Absolute: 0.1 10*3/uL (ref 0.0–0.7)
HCT: 38.7 % — ABNORMAL LOW (ref 39.0–52.0)
Hemoglobin: 13.3 g/dL (ref 13.0–17.0)
LYMPHS ABS: 0.4 10*3/uL — AB (ref 0.7–4.0)
Lymphocytes Relative: 6.4 % — ABNORMAL LOW (ref 12.0–46.0)
MCHC: 34.3 g/dL (ref 30.0–36.0)
MCV: 98.2 fl (ref 78.0–100.0)
MONO ABS: 0.8 10*3/uL (ref 0.1–1.0)
Monocytes Relative: 12.4 % — ABNORMAL HIGH (ref 3.0–12.0)
NEUTROS PCT: 78.9 % — AB (ref 43.0–77.0)
Neutro Abs: 4.9 10*3/uL (ref 1.4–7.7)
PLATELETS: 149 10*3/uL — AB (ref 150.0–400.0)
RBC: 3.94 Mil/uL — AB (ref 4.22–5.81)
RDW: 12.8 % (ref 11.5–15.5)
WBC: 6.2 10*3/uL (ref 4.0–10.5)

## 2015-12-21 LAB — COMPREHENSIVE METABOLIC PANEL
ALK PHOS: 59 U/L (ref 39–117)
ALT: 16 U/L (ref 0–53)
AST: 31 U/L (ref 0–37)
Albumin: 4.6 g/dL (ref 3.5–5.2)
BILIRUBIN TOTAL: 0.4 mg/dL (ref 0.2–1.2)
BUN: 15 mg/dL (ref 6–23)
CO2: 30 meq/L (ref 19–32)
CREATININE: 1.3 mg/dL (ref 0.40–1.50)
Calcium: 9.9 mg/dL (ref 8.4–10.5)
Chloride: 104 mEq/L (ref 96–112)
GFR: 56.27 mL/min — AB (ref >60.00)
GLUCOSE: 81 mg/dL (ref 70–99)
Potassium: 3.9 mEq/L (ref 3.5–5.1)
SODIUM: 141 meq/L (ref 135–145)
Total Protein: 6.7 g/dL (ref 6.0–8.3)

## 2015-12-21 LAB — POCT URINALYSIS DIPSTICK
Bilirubin, UA: NEGATIVE
Blood, UA: NEGATIVE
Glucose, UA: NEGATIVE
KETONES UA: NEGATIVE
Leukocytes, UA: NEGATIVE
Nitrite, UA: NEGATIVE
PROTEIN UA: NEGATIVE
SPEC GRAV UA: 1.02
Urobilinogen, UA: 0.2
pH, UA: 5

## 2015-12-21 LAB — TSH: TSH: 0.74 u[IU]/mL (ref 0.35–4.50)

## 2015-12-21 LAB — SEDIMENTATION RATE: Sed Rate: 2 mm/hr (ref 0–22)

## 2015-12-21 LAB — PSA: PSA: 2.21 ng/mL (ref 0.10–4.00)

## 2015-12-21 NOTE — Assessment & Plan Note (Addendum)
Patient reports chronic fatigue over the last several years. Prior evaluation by GI didn't reveal any cause. No obvious sources on exam. No focal findings on history either. Could be related to anemia as he has a history of this previously. Could be related to his hairy cell leukemia as well. Orthostatics did not quite meet criteria for orthostatic hypotension. Discussed staying well-hydrated. Continue to monitor blood pressure. We'll check lab work as outlined below. We'll check a UA as well. Given his history of cancer we will check a lumbar spine film given his back pain as well. He will continue to monitor. He is given return precautions.

## 2015-12-21 NOTE — Patient Instructions (Addendum)
Nice to see you. We are going to obtain some lab work to evaluate for cause of your symptoms. We will also obtain an x-ray of your low back given your back pain. Please monitor your back pain and if it worsens please let us know. If you develop chest pain, shortness of breath, abdominal pain, blood in her stool, trouble swallowing, numbness, weakness, loss of bowel or bladder function, numbness between her legs, fevers, or any new or changing symptoms please seek medical attention.

## 2015-12-21 NOTE — Progress Notes (Signed)
Patient ID: Frank Mcdaniel, male   DOB: 1933/02/14, 82 y.o.   MRN: [id number]  [name], MD Phone: [phone]  Frank Mcdaniel is a 82 y.o. male who presents today for same-day visit.  Patient reports a variety of complaints today. Notes he overall feels tired and weak. Notes some nausea intermittently. This is minimal. No abdominal pain, diarrhea, constipation, blood in his stool, fever, or early satiety. No chest pain or shortness of breath. Had a cardiac catheter last year due to fatigue and per the catheter report the cardiologist felt that there was no cardiac cause for his fatigue. Notes he has been eating well. Notes his balance seems to be a little off. No lightheadedness. Does note some mild low back discomfort that is chronic for years and worsened several days ago that has improved. Does not radiate down his legs. No numbness or weakness in his lower extremities. No loss of bowel or bladder function. No saddle anesthesia. Does note urinary frequency and having BPH. Notes nocturia 6. States he has previously been evaluated by GI for this as he had had some dysphasia and had an EGD 2 years ago. Notes all the symptoms started after the EGD. He's been on Zantac. States last GI physician advised him his symptoms were from his brain. Denies depression. Does have a history of hairy cell leukemia area  PMH: nonsmoker    ROS see history of present illness  Objective  Physical Exam Filed Vitals:   12/21/15 1042  BP: 112/58  Pulse: 68  Temp: 99.1 F (37.3 C)    BP Readings from Last 3 Encounters:  12/21/15 112/58  11/08/15 119/59  10/26/15 128/68   Wt Readings from Last 3 Encounters:  11/08/15 163 lb 12.8 oz (74.3 kg)  10/26/15 167 lb 8 oz (75.978 kg)  09/11/15 165 lb 12.8 oz (75.206 kg)   Laying blood pressure 143/70 pulse 62 Sitting blood pressure 127/67 pulse 64 Standing blood pressure 125/66 pulse 64  Physical Exam  Constitutional: He is  well-developed, well-nourished, and in no distress.  HENT:  Head: Normocephalic and atraumatic.  Right Ear: External ear normal.  Left Ear: External ear normal.  Mouth/Throat: Oropharynx is clear and moist. No oropharyngeal exudate.  Eyes: Conjunctivae are normal. Pupils are equal, round, and reactive to light.  Neck: Neck supple.  Cardiovascular: Normal rate, regular rhythm and normal heart sounds.   Pulmonary/Chest: Effort normal and breath sounds normal.  Abdominal: Soft. Bowel sounds are normal. He exhibits no distension. There is no tenderness. There is no rebound and no guarding.  Genitourinary:  Prostate enlarged, normal rectal tone, normal perineal sensation  Musculoskeletal:  No midline spine tenderness, no midline spine step-off, no muscular back tenderness  Lymphadenopathy:    He has no cervical adenopathy.  Neurological: He is alert.  Absent hearing to finger rub bilaterally, otherwise CN 2-12 intact, 5/5 strength in bilateral biceps, triceps, grip, quads, hamstrings, plantar and dorsiflexion, sensation to light touch intact in bilateral UE and LE, normal gait, 2+ patellar reflexes  Skin: Skin is warm and dry. He is not diaphoretic.  Psychiatric: Mood and affect normal.     Assessment/Plan: Please see individual problem list.  Chronic fatigue Patient reports chronic fatigue over the last several years. Prior evaluation by GI didn't reveal any cause. No obvious sources on exam. No focal findings on history either. Could be related to anemia as he has a history of this previously. Could be related to his hairy cell leukemia as well.  Orthostatics did not quite meet criteria for orthostatic hypotension. Discussed staying well-hydrated. Continue to monitor blood pressure. We'll check lab work as outlined below. We'll check a UA as well. Given his history of cancer we will check a lumbar spine film given his back pain as well. He will continue to monitor. He is given return  precautions.  Bilateral low back pain without sciatica Chronic intermittent issue. Worsened recently. Neurologically intact. Normal rectal tone and perineal sensation. Can continue wraps for his back. We will obtain an x-ray of his lumbar spine given his history of cancer. Once lab work and imaging returns we'll determine next step in management. Given return precautions.    Orders Placed This Encounter  Procedures  . DG Lumbar Spine Complete    Standing Status: Future     Number of Occurrences: 1     Standing Expiration Date: 02/19/2017    Order Specific Question:  Reason for Exam (SYMPTOM  OR DIAGNOSIS REQUIRED)    Answer:  low back pain for years, worse recently, history of hairy cell leukemia    Order Specific Question:  Preferred imaging location?    Answer:  [facility]  . CBC w/Diff  . Comp Met (CMET)  . TSH  . Sed Rate (ESR)  . PSA  . POCT Urinalysis Dipstick     [name], MD [place]

## 2015-12-21 NOTE — Assessment & Plan Note (Signed)
Chronic intermittent issue. Worsened recently. Neurologically intact. Normal rectal tone and perineal sensation. Can continue wraps for his back. We will obtain an x-ray of his lumbar spine given his history of cancer. Once lab work and imaging returns we'll determine next step in management. Given return precautions.

## 2015-12-22 ENCOUNTER — Telehealth: Payer: Self-pay

## 2015-12-22 NOTE — Telephone Encounter (Signed)
-----   Message from [name], MD sent at 12/22/2015  1:41 PM EDT ----- Please let the patient know that his lab work did reveal decreased lymphocytes, though these appear stable. The rest of his CBC was stable. His other lab work is unremarkable. No obvious cause on lab work. His symptoms could be related to his hairy cell leukemia and I would suggest follow-up with his oncologist. Thanks.

## 2015-12-22 NOTE — Telephone Encounter (Signed)
Called patient. Gave lab results and instructions. Patient verbalized understanding.   

## 2015-12-25 ENCOUNTER — Inpatient Hospital Stay: Payer: Medicare Other | Attending: Oncology | Admitting: Oncology

## 2015-12-25 VITALS — BP 154/75 | HR 71 | Temp 98.2°F | Resp 18 | Wt 160.1 lb

## 2015-12-25 DIAGNOSIS — N183 Chronic kidney disease, stage 3 (moderate): Secondary | ICD-10-CM | POA: Insufficient documentation

## 2015-12-25 DIAGNOSIS — Z951 Presence of aortocoronary bypass graft: Secondary | ICD-10-CM | POA: Diagnosis not present

## 2015-12-25 DIAGNOSIS — Z9221 Personal history of antineoplastic chemotherapy: Secondary | ICD-10-CM | POA: Insufficient documentation

## 2015-12-25 DIAGNOSIS — M545 Low back pain: Secondary | ICD-10-CM | POA: Insufficient documentation

## 2015-12-25 DIAGNOSIS — I129 Hypertensive chronic kidney disease with stage 1 through stage 4 chronic kidney disease, or unspecified chronic kidney disease: Secondary | ICD-10-CM | POA: Diagnosis not present

## 2015-12-25 DIAGNOSIS — Z7982 Long term (current) use of aspirin: Secondary | ICD-10-CM | POA: Diagnosis not present

## 2015-12-25 DIAGNOSIS — R5383 Other fatigue: Secondary | ICD-10-CM | POA: Insufficient documentation

## 2015-12-25 DIAGNOSIS — M549 Dorsalgia, unspecified: Secondary | ICD-10-CM | POA: Insufficient documentation

## 2015-12-25 DIAGNOSIS — E785 Hyperlipidemia, unspecified: Secondary | ICD-10-CM | POA: Insufficient documentation

## 2015-12-25 DIAGNOSIS — D61818 Other pancytopenia: Secondary | ICD-10-CM | POA: Insufficient documentation

## 2015-12-25 DIAGNOSIS — I2510 Atherosclerotic heart disease of native coronary artery without angina pectoris: Secondary | ICD-10-CM | POA: Diagnosis not present

## 2015-12-25 DIAGNOSIS — C9141 Hairy cell leukemia, in remission: Secondary | ICD-10-CM | POA: Insufficient documentation

## 2015-12-25 DIAGNOSIS — R531 Weakness: Secondary | ICD-10-CM | POA: Insufficient documentation

## 2015-12-25 DIAGNOSIS — F419 Anxiety disorder, unspecified: Secondary | ICD-10-CM | POA: Insufficient documentation

## 2015-12-25 DIAGNOSIS — R5381 Other malaise: Secondary | ICD-10-CM

## 2015-12-25 DIAGNOSIS — R109 Unspecified abdominal pain: Secondary | ICD-10-CM | POA: Diagnosis not present

## 2015-12-25 DIAGNOSIS — Z79899 Other long term (current) drug therapy: Secondary | ICD-10-CM | POA: Diagnosis not present

## 2015-12-25 DIAGNOSIS — J449 Chronic obstructive pulmonary disease, unspecified: Secondary | ICD-10-CM | POA: Diagnosis not present

## 2015-12-25 NOTE — Progress Notes (Signed)
[facility]  Telephone:([phone] [phone]  ID: Frank Mcdaniel [place] OB: March 01, 1933  MR#: [id number]  ZYY#:[id number]  Patient Care Team: [name], MD as PCP - General (Family Medicine) [name], MD as Consulting Physician (Cardiology) [name], MD as Consulting Physician (Oncology)  CHIEF COMPLAINT:  Chief Complaint  Patient presents with  . Fatigue  . Leukemia    INTERVAL HISTORY: Patient returns to clinic today for repeat laboratory work and further evaluation. His complaint today is weakness and fatigue and back pain. He also has some stomach discomfort which is relieved with Tums. He denies nausea, vomiting, diarrhea or constipation. He no longer has shortness of breath. He does not complain of chest pain today. He denies any fevers. He has no neurologic complaints. He denies any weight loss. He has no urinary complaints. Patient offers no specific complaints today.   REVIEW OF SYSTEMS:   Review of Systems  Constitutional: Positive for malaise/fatigue. Negative for fever.  Cardiovascular: Negative for chest pain.  Gastrointestinal: Negative for nausea and vomiting.  Musculoskeletal: Positive for back pain.  Neurological: Positive for weakness.    As per HPI. Otherwise, a complete review of systems is negatve.  PAST MEDICAL HISTORY: Past Medical History  Diagnosis Date  . Coronary artery disease     a. 2000 s/p CABG;  b. 10/2008 Neg MV, EF 64%; c. cath 05/2015: LM 85% sev cal, pLAD-1 lesion 80%, pLAD-2 lesion 100% chronic, ostLCx 70% sev cal, OM4 90%, ostRCA 70%, mid RCA 90%, LIMA-LAD patent, VG-OM2 patent, VG-OM3 patent, VG-RPDA patent. No AS  . BPH (benign prostatic hyperplasia)   . Hyperlipidemia   . Hairy cell leukemia ([place])   . H/O echocardiogram     a. 10/2005 Echo: nl EF.  [name] Atypical chest pain     a. 08/2014  . Asymptomatic Sinus Bradycardia   . Asthma   . Hypertension   . GERD (gastroesophageal reflux  disease)   . HTN (hypertension)   . CKD (chronic kidney disease), stage III   . COPD (chronic obstructive pulmonary disease) ([place])   . Shortness of breath dyspnea   . Anemia 09/01/2015  . Anxiety 09/11/2015    PAST SURGICAL HISTORY: Past Surgical History  Procedure Laterality Date  . Coronary artery bypass graft  2000  . Cataract extraction    . Cardiac catheterization N/A 05/26/2015    Procedure: Left Heart Cath;  Surgeon: [name], MD;  Location: [place] [facility];  Service: Cardiovascular;  Laterality: N/A;  . Cardiac catheterization N/A 05/26/2015    Procedure: Coronary/Graft Angiography;  Surgeon: [name], MD;  Location: [place] [facility];  Service: Cardiovascular;  Laterality: N/A;  . Tonsillectomy  1942     FAMILY HISTORY Family History  Problem Relation Age of Onset  . Other      no premature CAD.  [name] Hypertension Mother   . Arthritis Father        ADVANCED DIRECTIVES:    HEALTH MAINTENANCE: Social History  Substance Use Topics  . Smoking status: Never Smoker   . Smokeless tobacco: Never Used  . Alcohol Use: No     No Known Allergies  Current Outpatient Prescriptions  Medication Sig Dispense Refill  . acyclovir (ZOVIRAX) 400 MG tablet Take 1 tablet (400 mg total) by mouth 2 (two) times daily. 60 tablet 4  . aspirin 81 MG EC tablet Take 2 tablets (162 mg total) by mouth daily. 30 tablet 0  . atorvastatin (  LIPITOR) 40 MG tablet Take 0.5 tablets (20 mg total) by mouth daily. 45 tablet 3  . Calcium Carbonate Antacid (TUMS CHEWY BITES PO) Take by mouth.    . isosorbide mononitrate (IMDUR) 30 MG 24 hr tablet Take 1 tablet (30 mg total) by mouth daily. 90 tablet 3  . Multiple Vitamin (MULTIVITAMIN) tablet Take 1 tablet by mouth daily.      . prochlorperazine (COMPAZINE) 10 MG tablet Take 1 tablet (10 mg total) by mouth every 6 (six) hours as needed for nausea or vomiting. 30 tablet 0  . tamsulosin (FLOMAX) 0.4 MG CAPS capsule Take 1 capsule  (0.4 mg total) by mouth daily. 30 capsule 3  . ranitidine (ZANTAC) 150 MG tablet Take 150 mg by mouth 2 (two) times daily. Reported on 12/25/2015     No current facility-administered medications for this visit.   Facility-Administered Medications Ordered in Other Visits  Medication Dose Route Frequency Provider Last Rate Last Dose  . sodium chloride 0.9 % injection 10 mL  10 mL Intracatheter PRN [name], MD      . sodium chloride 0.9 % injection 10 mL  10 mL Intracatheter PRN [name], MD        OBJECTIVE: Filed Vitals:   12/25/15 1436  BP: 154/75  Pulse: 71  Temp: 98.2 F (36.8 C)  Resp: 18     Body mass index is 24.34 kg/(m^2).    ECOG FS:1 - Symptomatic but completely ambulatory  General: Well-developed, well-nourished, no acute distress. Eyes: anicteric sclera. Lungs: Clear to auscultation bilaterally. Heart: Regular rate and rhythm. No rubs, murmurs, or gallops. Abdomen: Soft, nontender, nondistended. No organomegaly noted, normoactive bowel sounds. Musculoskeletal: No edema, cyanosis, or clubbing. Neuro: Alert, answering all questions appropriately. Cranial nerves grossly intact. Skin: No rashes or petechiae noted. Psych: Normal affect.   LAB RESULTS:  Lab Results  Component Value Date   NA 141 12/21/2015   K 3.9 12/21/2015   CL 104 12/21/2015   CO2 30 12/21/2015   GLUCOSE 81 12/21/2015   BUN 15 12/21/2015   CREATININE 1.30 12/21/2015   CALCIUM 9.9 12/21/2015   PROT 6.7 12/21/2015   ALBUMIN 4.6 12/21/2015   AST 31 12/21/2015   ALT 16 12/21/2015   ALKPHOS 59 12/21/2015   BILITOT 0.4 12/21/2015   GFRNONAA 46* 11/08/2015   GFRAA 54* 11/08/2015    Lab Results  Component Value Date   WBC 6.2 12/21/2015   NEUTROABS 4.9 12/21/2015   HGB 13.3 12/21/2015   HCT 38.7* 12/21/2015   MCV 98.2 12/21/2015   PLT 149.0* 12/21/2015     STUDIES: Dg Lumbar Spine Complete  12/21/2015  CLINICAL DATA:  Intermittent bilateral low back pain without  radicular symptoms ; increased symptoms for the past 4 days without known injury; history of leukemia. EXAM: LUMBAR SPINE - COMPLETE 4+ VIEW COMPARISON:  None in PACs FINDINGS: There is mild levo curvature of the lumbar spine centered at L 2-3. The pedicles and transverse processes are intact. The vertebral bodies are preserved in height. There is moderate disc space narrowing at L2-3, L3-4, and L4-5 with mild narrowing of T12-L1 and L1-L2. There is multilevel facet joint hypertrophy. There is mild grade 1 anterolisthesis of L4 with respect L5 likely on the basis of facet joint hypertrophy. Small anterior endplate osteophytes are present at multiple levels. IMPRESSION: There is multilevel degenerative disc disease and facet joint hypertrophy. There is no compression fracture nor other acute bony abnormality. Electronically Signed   By:  David  Martinique M.D.   On: 12/21/2015 15:14    ASSESSMENT:  Hairy cell leukemia.  PLAN:   1. Hairy cell leukemia: Patient initially received cladribine in 2006 and then was treated for a relapse in 2011 and again in 2014. Bone marrow biopsy confirmed recurrence of disease with 80-90% hairy cell leukemia noted in his specimen. Patient completed cladribine 0.15 mg/kg on days 1 through 5 along with 8 weekly cycles of Rituxan on April 18, 2015. Bone marrow biopsy results after completion of 8 weeks of Rituxan without evidence of residual disease. Patients lab work is stable today however he is symptomatic. He is refusing a bone marrow biopsy at this time but will call back in about 3 weeks and if he is still feeling "lousy" will schedule for the biopsy. Otherwise he will keep his next appointment which is already scheduled for February 08, 2016. 2. Leukopenia: Patient's white blood cell count is WNL today at 6.2, monitor. 3. Pancytopenia: Stable. HGB WNL at 13.3, PLT 149 Monitor.  Patient expressed understanding and was in agreement with this plan. He also understands that He can  call clinic at any time with any questions, concerns, or complaints.    Mayra Reel, NP   12/25/2015 3:46 PM  Patient was seen and evaluated independently and I agree with the assessment and plan as dictated above.  [name], MD 12/27/2015 10:38 AM     ROS

## 2015-12-25 NOTE — Progress Notes (Signed)
For the past 2 months his fatigue has been increasing so his PCP checked labs which resulted some abnormalities.  PCP would like for him to see Dr. [name] regarding lab results.

## 2015-12-28 ENCOUNTER — Telehealth: Payer: Self-pay

## 2015-12-28 ENCOUNTER — Other Ambulatory Visit: Payer: Self-pay | Admitting: *Deleted

## 2015-12-28 DIAGNOSIS — C9140 Hairy cell leukemia not having achieved remission: Secondary | ICD-10-CM

## 2015-12-28 NOTE — Telephone Encounter (Signed)
Patient would like to have bone marrow biopsy that was discussed at last visit.  If possible he is requesting BM scheduled on 01/02/16 or 01/04/16.

## 2015-12-28 NOTE — Telephone Encounter (Signed)
Scheduling message sent for bone marrow biopsy to be set up for patient.

## 2016-01-02 ENCOUNTER — Telehealth: Payer: Self-pay | Admitting: *Deleted

## 2016-01-02 NOTE — Telephone Encounter (Signed)
-----   Message from [place] sent at 01/02/2016 11:42 AM EDT ----- Contact: [phone] Patient left several messages on my phone this morning stating he needed to talk with Dr [name] or a nurse. States he is very tired and weak, please return call to patient.

## 2016-01-02 NOTE — Telephone Encounter (Signed)
Still feeling weak and tired and asking if there is a pill we can give him to make him feel better, He is scheduled for BMBx Friday adn I explained until we have results we will not know what to tx him with and he stated he understands and will wait

## 2016-01-03 ENCOUNTER — Other Ambulatory Visit: Payer: Self-pay | Admitting: Radiology

## 2016-01-05 ENCOUNTER — Ambulatory Visit (HOSPITAL_COMMUNITY)
Admission: RE | Admit: 2016-01-05 | Discharge: 2016-01-05 | Disposition: A | Discharge status: Home / Self Care | Payer: Medicare Other | Source: Ambulatory Visit | Attending: Oncology | Admitting: Oncology

## 2016-01-05 ENCOUNTER — Encounter (HOSPITAL_COMMUNITY): Payer: Self-pay

## 2016-01-05 DIAGNOSIS — Z79899 Other long term (current) drug therapy: Secondary | ICD-10-CM | POA: Insufficient documentation

## 2016-01-05 DIAGNOSIS — Z7982 Long term (current) use of aspirin: Secondary | ICD-10-CM | POA: Diagnosis not present

## 2016-01-05 DIAGNOSIS — C9140 Hairy cell leukemia not having achieved remission: Secondary | ICD-10-CM | POA: Diagnosis not present

## 2016-01-05 DIAGNOSIS — N183 Chronic kidney disease, stage 3 (moderate): Secondary | ICD-10-CM | POA: Insufficient documentation

## 2016-01-05 DIAGNOSIS — R5383 Other fatigue: Secondary | ICD-10-CM | POA: Insufficient documentation

## 2016-01-05 DIAGNOSIS — K219 Gastro-esophageal reflux disease without esophagitis: Secondary | ICD-10-CM | POA: Insufficient documentation

## 2016-01-05 DIAGNOSIS — J449 Chronic obstructive pulmonary disease, unspecified: Secondary | ICD-10-CM | POA: Insufficient documentation

## 2016-01-05 DIAGNOSIS — Z9221 Personal history of antineoplastic chemotherapy: Secondary | ICD-10-CM | POA: Diagnosis not present

## 2016-01-05 DIAGNOSIS — Z951 Presence of aortocoronary bypass graft: Secondary | ICD-10-CM | POA: Diagnosis not present

## 2016-01-05 DIAGNOSIS — E785 Hyperlipidemia, unspecified: Secondary | ICD-10-CM | POA: Diagnosis not present

## 2016-01-05 DIAGNOSIS — I129 Hypertensive chronic kidney disease with stage 1 through stage 4 chronic kidney disease, or unspecified chronic kidney disease: Secondary | ICD-10-CM | POA: Diagnosis not present

## 2016-01-05 DIAGNOSIS — I2510 Atherosclerotic heart disease of native coronary artery without angina pectoris: Secondary | ICD-10-CM | POA: Insufficient documentation

## 2016-01-05 DIAGNOSIS — R531 Weakness: Secondary | ICD-10-CM | POA: Insufficient documentation

## 2016-01-05 LAB — CBC WITH DIFFERENTIAL/PLATELET
Basophils Absolute: 0 10*3/uL (ref 0.0–0.1)
Basophils Relative: 0 %
EOS ABS: 0.2 10*3/uL (ref 0.0–0.7)
Eosinophils Relative: 3 %
HCT: 39.9 % (ref 39.0–52.0)
HEMOGLOBIN: 13.9 g/dL (ref 13.0–17.0)
LYMPHS ABS: 0.3 10*3/uL — AB (ref 0.7–4.0)
LYMPHS PCT: 4 %
MCH: 33 pg (ref 26.0–34.0)
MCHC: 34.8 g/dL (ref 30.0–36.0)
MCV: 94.8 fL (ref 78.0–100.0)
Monocytes Absolute: 0.9 10*3/uL (ref 0.1–1.0)
Monocytes Relative: 12 %
NEUTROS PCT: 81 %
Neutro Abs: 5.7 10*3/uL (ref 1.7–7.7)
Platelets: 139 10*3/uL — ABNORMAL LOW (ref 150–400)
RBC: 4.21 MIL/uL — AB (ref 4.22–5.81)
RDW: 12.1 % (ref 11.5–15.5)
WBC: 7 10*3/uL (ref 4.0–10.5)

## 2016-01-05 LAB — PROTIME-INR
INR: 1.14 (ref 0.00–1.49)
PROTHROMBIN TIME: 14.3 s (ref 11.6–15.2)

## 2016-01-05 LAB — BONE MARROW EXAM

## 2016-01-05 MED ORDER — FENTANYL CITRATE (PF) 100 MCG/2ML IJ SOLN
INTRAMUSCULAR | Status: AC
  Filled 2016-01-05: qty 4

## 2016-01-05 MED ORDER — SODIUM CHLORIDE 0.9 % IV SOLN
INTRAVENOUS | Status: DC
  Administered 2016-01-05: 09:00:00 via INTRAVENOUS

## 2016-01-05 MED ORDER — MIDAZOLAM HCL 2 MG/2ML IJ SOLN
INTRAMUSCULAR | Status: AC
  Filled 2016-01-05: qty 4

## 2016-01-05 MED ORDER — FENTANYL CITRATE (PF) 100 MCG/2ML IJ SOLN
INTRAMUSCULAR | Status: AC | PRN
  Administered 2016-01-05: 50 ug via INTRAVENOUS

## 2016-01-05 MED ORDER — MIDAZOLAM HCL 2 MG/2ML IJ SOLN
INTRAMUSCULAR | Status: AC | PRN
  Administered 2016-01-05: 1 mg via INTRAVENOUS

## 2016-01-05 NOTE — H&P (Signed)
Referring Physician(s): Finnegan,Timothy J  Supervising Physician: Aletta Edouard  Patient Status: OP  Chief Complaint: "I'm getting another bone marrow biopsy"   Subjective: Patient familiar to IR service from prior CT-guided bone marrow biopsies in July and September 2016 at [facility]. He has a history of hairy cell leukemia with last chemotherapy in 2016. He presents now with persistent weakness and fatigue and request received for repeat CT-guided bone marrow biopsy for further evaluation. He currently denies fever, headache, chest pain, dyspnea, cough, abdominal pain, nausea, vomiting or abnormal bleeding. He does have intermittent low back pain. Past Medical History  Diagnosis Date  . Coronary artery disease     a. 2000 s/p CABG;  b. 10/2008 Neg MV, EF 64%; c. cath 05/2015: LM 85% sev cal, pLAD-1 lesion 80%, pLAD-2 lesion 100% chronic, ostLCx 70% sev cal, OM4 90%, ostRCA 70%, mid RCA 90%, LIMA-LAD patent, VG-OM2 patent, VG-OM3 patent, VG-RPDA patent. No AS  . BPH (benign prostatic hyperplasia)   . Hyperlipidemia   . Hairy cell leukemia ([place])   . H/O echocardiogram     a. 10/2005 Echo: nl EF.  [name] Atypical chest pain     a. 08/2014  . Asymptomatic Sinus Bradycardia   . Asthma   . Hypertension   . GERD (gastroesophageal reflux disease)   . HTN (hypertension)   . CKD (chronic kidney disease), stage III   . COPD (chronic obstructive pulmonary disease) ([place])   . Shortness of breath dyspnea   . Anemia 09/01/2015  . Anxiety 09/11/2015  ' Past Surgical History  Procedure Laterality Date  . Coronary artery bypass graft  2000  . Cataract extraction    . Cardiac catheterization N/A 05/26/2015    Procedure: Left Heart Cath;  Surgeon: [name], MD;  Location: [place] [place] [facility];  Service: Cardiovascular;  Laterality: N/A;  . Cardiac catheterization N/A 05/26/2015    Procedure: Coronary/Graft Angiography;  Surgeon: [name], MD;  Location: [place] [facility];  Service: Cardiovascular;  Laterality: N/A;  . Tonsillectomy  1942      Allergies: Review of patient's allergies indicates no known allergies.  Medications: Prior to Admission medications   Medication Sig Start Date End Date Taking? Authorizing Provider  acyclovir (ZOVIRAX) 400 MG tablet Take 1 tablet (400 mg total) by mouth 2 (two) times daily. 11/13/15   [name], MD  aspirin 81 MG EC tablet Take 2 tablets (162 mg total) by mouth daily. 11/08/10   [name], MD  atorvastatin (LIPITOR) 40 MG tablet Take 0.5 tablets (20 mg total) by mouth daily. 10/26/15   [name], MD  Calcium Carbonate Antacid (TUMS CHEWY BITES PO) Take by mouth.    Historical Provider, MD  isosorbide mononitrate (IMDUR) 30 MG 24 hr tablet Take 1 tablet (30 mg total) by mouth daily. 08/14/15   Rise Mu, PA-C  Multiple Vitamin (MULTIVITAMIN) tablet Take 1 tablet by mouth daily.      Historical Provider, MD  prochlorperazine (COMPAZINE) 10 MG tablet Take 1 tablet (10 mg total) by mouth every 6 (six) hours as needed for nausea or vomiting. 06/27/15   [name], MD  ranitidine (ZANTAC) 150 MG tablet Take 150 mg by mouth daily. Reported on 12/25/2015    Historical Provider, MD  tamsulosin (FLOMAX) 0.4 MG CAPS capsule Take 1 capsule (0.4 mg total) by mouth daily. 10/26/15   [name], MD     Vital Signs: BP 138/64 mmHg  Pulse 65  Temp(Src) 98.3 F (36.8 C) (Oral)  Resp 18  Wt 156 lb 9.6 oz (71.033 kg)  SpO2 98%  Physical Exam patient awake, alert. Chest clear to auscultation bilaterally. Heart with regular rate and rhythm. Abdomen soft, positive bowel sounds, nontender. Lower extremities with no edema.  Imaging: No results found.  Labs:  CBC:  Recent Labs  08/10/15 1019 11/08/15 0838 12/21/15 1123 01/05/16 0920  WBC 2.2* 4.2 6.2 7.0  HGB 10.0* 10.4* 13.3 13.9  HCT 30.4* 30.8* 38.7* 39.9  PLT 143* 127* 149.0* 139*    COAGS:  Recent Labs  02/14/15 0812  05/01/15 0806 05/11/15 1505 01/05/16 0920  INR 1.14 1.14 1.0 1.14  APTT  --  36  --   --     BMP:  Recent Labs  05/11/15 1505 06/21/15 1433 08/10/15 1019 11/08/15 0838 12/21/15 1123  NA 141 137 136 138 141  K 3.8 3.0* 4.1 3.9 3.9  CL 104 106 105 110 104  CO2 25 26 27 25 30   GLUCOSE 120* 101* 101* 97 81  BUN 10 11 13 20 15   CALCIUM 8.9 9.0 8.9 8.8* 9.9  CREATININE 1.28* 1.09 1.33* 1.38* 1.30  GFRNONAA 53* >60 49* 46*  --   GFRAA 61 >60 57* 54*  --     LIVER FUNCTION TESTS:  Recent Labs  06/21/15 1433 08/10/15 1019 11/08/15 0838 12/21/15 1123  BILITOT 1.3* 1.2 1.4* 0.4  AST 24 25 35 31  ALT 9* 11* 15* 16  ALKPHOS 53 65 53 59  PROT 6.6 6.6 6.8 6.7  ALBUMIN 4.3 4.3 4.4 4.6    Assessment and Plan: Patient with prior history of hairy cell leukemia in 2016, status post chemotherapy. Now with persistent weakness and fatigue. He presents again today for CT-guided bone marrow biopsy for further evaluation.Risks and benefits discussed with the patient/son including, but not limited to bleeding, infection, damage to adjacent structures or low yield requiring additional tests. All of the patient's questions were answered, patient is agreeable to proceed. Consent signed and in chart.     Electronically Signed: D. Rowe Robert 01/05/2016, 9:37 AM   I spent a total of 15 minutes at the the patient's bedside AND on the patient's hospital floor or unit, greater than 50% of which was counseling/coordinating care for CT-guided bone marrow biopsy

## 2016-01-05 NOTE — Discharge Instructions (Signed)
Moderate Conscious Sedation, Adult, Care After °Refer to this sheet in the next few weeks. These instructions provide you with information on caring for yourself after your procedure. Your health care provider may also give you more specific instructions. Your treatment has been planned according to current medical practices, but problems sometimes occur. Call your health care provider if you have any problems or questions after your procedure. °WHAT TO EXPECT AFTER THE PROCEDURE  °After your procedure: °· You may feel sleepy, clumsy, and have poor balance for several hours. °· Vomiting may occur if you eat too soon after the procedure. °HOME CARE INSTRUCTIONS °· Do not participate in any activities where you could become injured for at least 24 hours. Do not: °¨ Drive. °¨ Swim. °¨ Ride a bicycle. °¨ Operate heavy machinery. °¨ Cook. °¨ Use power tools. °¨ Climb ladders. °¨ Work from a high place. °· Do not make important decisions or sign legal documents until you are improved. °· If you vomit, drink water, juice, or soup when you can drink without vomiting. Make sure you have little or no nausea before eating solid foods. °· Only take over-the-counter or prescription medicines for pain, discomfort, or fever as directed by your health care provider. °· Make sure you and your family fully understand everything about the medicines given to you, including what side effects may occur. °· You should not drink alcohol, take sleeping pills, or take medicines that cause drowsiness for at least 24 hours. °· If you smoke, do not smoke without supervision. °· If you are feeling better, you may resume normal activities 24 hours after you were sedated. °· Keep all appointments with your health care provider. °SEEK MEDICAL CARE IF: °· Your skin is pale or bluish in color. °· You continue to feel nauseous or vomit. °· Your pain is getting worse and is not helped by medicine. °· You have bleeding or swelling. °· You are still  sleepy or feeling clumsy after 24 hours. °SEEK IMMEDIATE MEDICAL CARE IF: °· You develop a rash. °· You have difficulty breathing. °· You develop any type of allergic problem. °· You have a fever. °MAKE SURE YOU: °· Understand these instructions. °· Will watch your condition. °· Will get help right away if you are not doing well or get worse. °  °This information is not intended to replace advice given to you by your health care provider. Make sure you discuss any questions you have with your health care provider. °  °Document Released: 05/12/2013 Document Revised: 08/12/2014 Document Reviewed: 05/12/2013 °Elsevier Interactive Patient Education ©2016 [employer] °Bone Marrow Aspiration and Bone Marrow Biopsy, Care After °Refer to this sheet in the next few weeks. These instructions provide you with information about caring for yourself after your procedure. Your health care provider may also give you more specific instructions. Your treatment has been planned according to current medical practices, but problems sometimes occur. Call your health care provider if you have any problems or questions after your procedure. °WHAT TO EXPECT AFTER THE PROCEDURE °After your procedure, it is common to have: °· Soreness or tenderness around the puncture site. °· Bruising. °HOME CARE INSTRUCTIONS °· Take medicines only as directed by your health care provider. °· Follow your health care provider's instructions about: °¨ Puncture site care. °¨ Bandage (dressing) changes and removal. °· Bathe and shower as directed by your health care provider. °· Check your puncture site every day for signs of infection. Watch for: °¨ Redness, swelling, or pain. °¨   Fluid, blood, or pus. °· Return to your normal activities as directed by your health care provider. °· Keep all follow-up visits as directed by your health care provider. This is important. °SEEK MEDICAL CARE IF: °· You have a fever. °· You have uncontrollable bleeding. °· You have  redness, swelling, or pain at the site of your puncture. °· You have fluid, blood, or pus coming from your puncture site. °  °This information is not intended to replace advice given to you by your health care provider. Make sure you discuss any questions you have with your health care provider. °  °Document Released: 02/08/2005 Document Revised: 12/06/2014 Document Reviewed: 07/13/2014 °Elsevier Interactive Patient Education ©2016 [employer] ° °

## 2016-01-05 NOTE — Procedures (Signed)
Interventional Radiology Procedure Note  Procedure: CT guided aspirate and core biopsy of right iliac bone Complications: None Recommendations: - Bedrest supine x 1 hrs - Follow biopsy results  Meric Joye T. Weston Fulco, M.D Pager:  [phone]   

## 2016-01-11 ENCOUNTER — Inpatient Hospital Stay: Payer: Medicare Other | Attending: Oncology | Admitting: Oncology

## 2016-01-11 VITALS — BP 139/75 | Temp 98.3°F | Wt 159.6 lb

## 2016-01-11 DIAGNOSIS — R5381 Other malaise: Secondary | ICD-10-CM | POA: Insufficient documentation

## 2016-01-11 DIAGNOSIS — C9141 Hairy cell leukemia, in remission: Secondary | ICD-10-CM | POA: Insufficient documentation

## 2016-01-11 DIAGNOSIS — Z79899 Other long term (current) drug therapy: Secondary | ICD-10-CM | POA: Insufficient documentation

## 2016-01-11 DIAGNOSIS — D696 Thrombocytopenia, unspecified: Secondary | ICD-10-CM

## 2016-01-11 DIAGNOSIS — K219 Gastro-esophageal reflux disease without esophagitis: Secondary | ICD-10-CM | POA: Insufficient documentation

## 2016-01-11 DIAGNOSIS — Z7982 Long term (current) use of aspirin: Secondary | ICD-10-CM | POA: Insufficient documentation

## 2016-01-11 DIAGNOSIS — N183 Chronic kidney disease, stage 3 (moderate): Secondary | ICD-10-CM | POA: Insufficient documentation

## 2016-01-11 DIAGNOSIS — N400 Enlarged prostate without lower urinary tract symptoms: Secondary | ICD-10-CM | POA: Diagnosis not present

## 2016-01-11 DIAGNOSIS — R531 Weakness: Secondary | ICD-10-CM | POA: Diagnosis not present

## 2016-01-11 DIAGNOSIS — E785 Hyperlipidemia, unspecified: Secondary | ICD-10-CM | POA: Diagnosis not present

## 2016-01-11 DIAGNOSIS — F419 Anxiety disorder, unspecified: Secondary | ICD-10-CM | POA: Diagnosis not present

## 2016-01-11 DIAGNOSIS — R5383 Other fatigue: Secondary | ICD-10-CM | POA: Insufficient documentation

## 2016-01-11 DIAGNOSIS — I129 Hypertensive chronic kidney disease with stage 1 through stage 4 chronic kidney disease, or unspecified chronic kidney disease: Secondary | ICD-10-CM | POA: Insufficient documentation

## 2016-01-11 DIAGNOSIS — M549 Dorsalgia, unspecified: Secondary | ICD-10-CM

## 2016-01-11 DIAGNOSIS — I2510 Atherosclerotic heart disease of native coronary artery without angina pectoris: Secondary | ICD-10-CM | POA: Diagnosis not present

## 2016-01-11 DIAGNOSIS — J449 Chronic obstructive pulmonary disease, unspecified: Secondary | ICD-10-CM | POA: Diagnosis not present

## 2016-01-11 NOTE — Progress Notes (Signed)
Patient is feeling tired, weak, and nausea.

## 2016-01-15 ENCOUNTER — Ambulatory Visit (INDEPENDENT_AMBULATORY_CARE_PROVIDER_SITE_OTHER): Payer: Medicare Other | Admitting: Family Medicine

## 2016-01-15 ENCOUNTER — Encounter: Payer: Self-pay | Admitting: Family Medicine

## 2016-01-15 VITALS — BP 138/72 | HR 63 | Temp 97.9°F | Ht 68.0 in | Wt 158.8 lb

## 2016-01-15 DIAGNOSIS — R110 Nausea: Secondary | ICD-10-CM

## 2016-01-15 DIAGNOSIS — R5382 Chronic fatigue, unspecified: Secondary | ICD-10-CM | POA: Diagnosis not present

## 2016-01-15 NOTE — Patient Instructions (Signed)
Nice to see you. We are going to refer you to GI to see if this is possibly contributing to your symptoms. Your workup thus far has been unrevealing. If you develop numbness, weakness, loss of bowel or bladder function, fevers, chest pain, shortness of breath, or any new or change in symptoms please seek medical attention.

## 2016-01-15 NOTE — Progress Notes (Signed)
Pre visit review using our clinic review tool, if applicable. No additional management support is needed unless otherwise documented below in the visit note. 

## 2016-01-16 LAB — CHROMOSOME ANALYSIS, BONE MARROW

## 2016-01-16 NOTE — Progress Notes (Signed)
[facility]  [phone] [phone]  ID: Frank Mcdaniel OB: 01/30/1933  MR#: [id number]  CSN#:[id number]  Patient Care Team: Eric G Sonnenberg, MD as PCP - General (Family Medicine)  J Gollan, MD as Consulting Physician (Cardiology)  J , MD as Consulting Physician (Oncology)  CHIEF COMPLAINT:  Chief Complaint  Patient presents with  . Hairy cell leukemia  . Results    INTERVAL HISTORY: Patient returns to clinic today for repeat laboratory work, discussion of his bone marrow biopsy results, and further evaluation. He continues to have increased weakness and fatigue, but otherwise feels well. He has no neurologic complaints. He denies any fevers. He continues to have occasional stomach discomfort which is relieved with Tums. He denies nausea, vomiting, diarrhea or constipation. He denies any chest pain or shortness of breath. He denies any weight loss. He has no urinary complaints. Patient offers no further specific complaints today.   REVIEW OF SYSTEMS:   Review of Systems  Constitutional: Positive for malaise/fatigue. Negative for fever.  Respiratory: Negative.  Negative for cough and shortness of breath.   Cardiovascular: Negative for chest pain.  Gastrointestinal: Negative for nausea and vomiting.  Genitourinary: Negative.   Musculoskeletal: Positive for back pain.  Neurological: Negative.   Psychiatric/Behavioral: Negative.     As per HPI. Otherwise, a complete review of systems is negatve.  PAST MEDICAL HISTORY: Past Medical History  Diagnosis Date  . Coronary artery disease     a. 2000 s/p CABG;  b. 10/2008 Neg MV, EF 64%; c. cath 05/2015: LM 85% sev cal, pLAD-1 lesion 80%, pLAD-2 lesion 100% chronic, ostLCx 70% sev cal, OM4 90%, ostRCA 70%, mid RCA 90%, LIMA-LAD patent, VG-OM2 patent, VG-OM3 patent, VG-RPDA patent. No AS  . BPH (benign prostatic hyperplasia)   . Hyperlipidemia   . Hairy cell leukemia  (HCC)   . H/O echocardiogram     a. 10/2005 Echo: nl EF.  . Atypical chest pain     a. 08/2014  . Asymptomatic Sinus Bradycardia   . Asthma   . Hypertension   . GERD (gastroesophageal reflux disease)   . HTN (hypertension)   . CKD (chronic kidney disease), stage III   . COPD (chronic obstructive pulmonary disease) (HCC)   . Shortness of breath dyspnea   . Anemia 09/01/2015  . Anxiety 09/11/2015    PAST SURGICAL HISTORY: Past Surgical History  Procedure Laterality Date  . Coronary artery bypass graft  2000  . Cataract extraction    . Cardiac catheterization N/A 05/26/2015    Procedure: Left Heart Cath;  Surgeon:  J Gollan, MD;  Location: [facility] INVASIVE CV LAB;  Service: Cardiovascular;  Laterality: N/A;  . Cardiac catheterization N/A 05/26/2015    Procedure: Coronary/Graft Angiography;  Surgeon:  J Gollan, MD;  Location: [facility] INVASIVE CV LAB;  Service: Cardiovascular;  Laterality: N/A;  . Tonsillectomy  1942     FAMILY HISTORY Family History  Problem Relation Age of Onset  . Other      no premature CAD.  . Hypertension Mother   . Arthritis Father        ADVANCED DIRECTIVES:    HEALTH MAINTENANCE: Social History  Substance Use Topics  . Smoking status: Never Smoker   . Smokeless tobacco: Never Used  . Alcohol Use: No     No Known Allergies  Current Outpatient Prescriptions  Medication Sig Dispense Refill  . acyclovir (ZOVIRAX) 400 MG tablet Take 1 tablet (400 mg total) by mouth 2 (two) times   daily. 60 tablet 4  . aspirin 81 MG EC tablet Take 2 tablets (162 mg total) by mouth daily. 30 tablet 0  . atorvastatin (LIPITOR) 40 MG tablet Take 0.5 tablets (20 mg total) by mouth daily. 45 tablet 3  . Calcium Carbonate Antacid (TUMS CHEWY BITES PO) Take by mouth.    . isosorbide mononitrate (IMDUR) 30 MG 24 hr tablet Take 1 tablet (30 mg total) by mouth daily. 90 tablet 3  . Multiple Vitamin (MULTIVITAMIN) tablet Take 1 tablet by mouth daily.      .  prochlorperazine (COMPAZINE) 10 MG tablet Take 1 tablet (10 mg total) by mouth every 6 (six) hours as needed for nausea or vomiting. 30 tablet 0  . ranitidine (ZANTAC) 150 MG tablet Take 150 mg by mouth daily. Reported on 12/25/2015    . tamsulosin (FLOMAX) 0.4 MG CAPS capsule Take 1 capsule (0.4 mg total) by mouth daily. 30 capsule 3   No current facility-administered medications for this visit.   Facility-Administered Medications Ordered in Other Visits  Medication Dose Route Frequency Provider Last Rate Last Dose  . sodium chloride 0.9 % injection 10 mL  10 mL Intracatheter PRN [name], MD      . sodium chloride 0.9 % injection 10 mL  10 mL Intracatheter PRN [name], MD        OBJECTIVE: Filed Vitals:   01/11/16 0934  BP: 139/75  Temp: 98.3 F (36.8 C)     Body mass index is 24.27 kg/(m^2).    ECOG FS:1 - Symptomatic but completely ambulatory  General: Well-developed, well-nourished, no acute distress. Eyes: anicteric sclera. Lungs: Clear to auscultation bilaterally. Heart: Regular rate and rhythm. No rubs, murmurs, or gallops. Abdomen: Soft, nontender, nondistended. No organomegaly noted, normoactive bowel sounds. Musculoskeletal: No edema, cyanosis, or clubbing. Neuro: Alert, answering all questions appropriately. Cranial nerves grossly intact. Skin: No rashes or petechiae noted. Psych: Normal affect.   LAB RESULTS:  Lab Results  Component Value Date   NA 141 12/21/2015   K 3.9 12/21/2015   CL 104 12/21/2015   CO2 30 12/21/2015   GLUCOSE 81 12/21/2015   BUN 15 12/21/2015   CREATININE 1.30 12/21/2015   CALCIUM 9.9 12/21/2015   PROT 6.7 12/21/2015   ALBUMIN 4.6 12/21/2015   AST 31 12/21/2015   ALT 16 12/21/2015   ALKPHOS 59 12/21/2015   BILITOT 0.4 12/21/2015   GFRNONAA 46* 11/08/2015   GFRAA 54* 11/08/2015    Lab Results  Component Value Date   WBC 7.0 01/05/2016   NEUTROABS 5.7 01/05/2016   HGB 13.9 01/05/2016   HCT 39.9 01/05/2016     MCV 94.8 01/05/2016   PLT 139* 01/05/2016     STUDIES: Dg Lumbar Spine Complete  12/21/2015  CLINICAL DATA:  Intermittent bilateral low back pain without radicular symptoms ; increased symptoms for the past 4 days without known injury; history of leukemia. EXAM: LUMBAR SPINE - COMPLETE 4+ VIEW COMPARISON:  None in PACs FINDINGS: There is mild levo curvature of the lumbar spine centered at L 2-3. The pedicles and transverse processes are intact. The vertebral bodies are preserved in height. There is moderate disc space narrowing at L2-3, L3-4, and L4-5 with mild narrowing of T12-L1 and L1-L2. There is multilevel facet joint hypertrophy. There is mild grade 1 anterolisthesis of L4 with respect L5 likely on the basis of facet joint hypertrophy. Small anterior endplate osteophytes are present at multiple levels. IMPRESSION: There is multilevel degenerative disc disease and  facet joint hypertrophy. There is no compression fracture nor other acute bony abnormality. Electronically Signed   By: David  Jordan M.D.   On: 12/21/2015 15:14   Ct Biopsy  01/05/2016  CLINICAL DATA:  Hairy cell leukemia not having a achieved remission with treatment. The patient requires bone marrow biopsy for further evaluation. EXAM: CT GUIDED BONE MARROW ASPIRATION AND BIOPSY ANESTHESIA/SEDATION: Versed 1.0 mg IV, Fentanyl 25 mcg IV Total Moderate Sedation Time 15 minutes. The patient's level of consciousness and physiologic status were continuously monitored during the procedure by Radiology nursing. PROCEDURE: The procedure risks, benefits, and alternatives were explained to the patient. Questions regarding the procedure were encouraged and answered. The patient understands and consents to the procedure. A time-out was performed prior to initiating the procedure. The right gluteal region was prepped with chlorhexidine. Sterile gown and sterile gloves were used for the procedure. Local anesthesia was provided with 1% Lidocaine.  Under CT guidance, an 11 gauge OnControl bone cutting needle was advanced from a posterior approach into the right iliac bone. Needle positioning was confirmed with CT. Initial non heparinized and heparinized aspirate samples were obtained of bone marrow. Core biopsy was performed through the OnControl needle. COMPLICATIONS: None FINDINGS: Inspection of initial aspirate did reveal visible particles. Intact core biopsy sample was obtained. IMPRESSION: CT guided bone marrow biopsy of right posterior iliac bone with both aspirate and core samples obtained. Electronically Signed   By: Glenn  Yamagata M.D.   On: 01/05/2016 14:32    ASSESSMENT:  Hairy cell leukemia, in complete remission.  PLAN:   1. Hairy cell leukemia: Bone marrow biopsy in May 2017 revealed no evidence of disease. Patient initially received cladribine in 2006 and then was treated for a relapse in 2011 and again in 2014. Bone marrow biopsy confirmed recurrence of disease with 80-90% hairy cell leukemia noted in his specimen. Patient completed cladribine 0.15 mg/kg on days 1 through 5 along with 8 weekly cycles of Rituxan on April 18, 2015. Bone marrow biopsy results after completion of 8 weeks of Rituxan without evidence of residual disease. No intervention is needed at this time. Return to clinic in 3 months with repeat laboratory work and further evaluation. 2. Thrombocytopenia: Mild, monitor.  Patient had normal trilineage hematopoiesis in his bone marrow.   Approximately 30 minutes spent in discussion of which greater than 50% was consultation.  Patient expressed understanding and was in agreement with this plan. He also understands that He can call clinic at any time with any questions, concerns, or complaints.     J , MD   01/16/2016 8:42 AM    ROS                  

## 2016-01-16 NOTE — Assessment & Plan Note (Signed)
Patient continues to have issues with this. He has had lab work that has been unremarkable. He followed up with his oncologist and per patient report it does not appear that this is related to his hairy cell leukemia. He had a catheterization last year that was unrevealing of cause as well. Unsure of cause at this time. Seems to be somewhat tied to stomach issues and thus we will have him see GI. I did discuss potentially repeating CT scan of his chest given his history of pulmonary nodules though given the recommendation for no further follow-up on prior CT scan myself and the patient decided to hold off on this until after GI evaluation. He'll continue to monitor. He is given return precautions.

## 2016-01-16 NOTE — Progress Notes (Signed)
Patient ID: Frank Mcdaniel, male   DOB: August 24, 1932, 82 y.o.   MRN: [id number]  [name], MD Phone: [phone]  Frank Mcdaniel is a 82 y.o. male who presents today for follow-up.  Chronic fatigue: Patient was seen several weeks ago for this. Had lab work to work this up was unrevealing. He was sent back to his oncologist given his history of hairy cell leukemia. He reports they recently did a bone marrow biopsy and states he was told that his leukemia had not returned. He notes he feels tired all the time. Notes this occurs even when sitting around. He notes nausea associated with this. No vomiting or diarrhea. Eats lots of antacid tablets. No chest pain or shortness of breath or orthopnea. He did have a catheterization in October 2016 though was not felt to explain his fatigue. He feels as though all the symptoms started after an EGD he had 3 years ago. He notes no depression. Minimal anxiety. He does note low back pain that has been a chronic issue and is unchanged. Occurs intermittently. No numbness or weakness. No loss of bowel or bladder function. No saddle anesthesia. No fevers. He had an x-ray of his low back after her last visit that did not reveal any acute changes. Revealed multilevel degenerative disc disease and facet joint hypertrophy. On review his records it does appear that he has pulmonary nodules seen on several CT scans. CT scan from 2015 advise no further follow-up as they were unchanged and of small size.  PMH: nonsmoker.   ROS see history of present illness  Objective  Physical Exam Filed Vitals:   01/15/16 1504  BP: 138/72  Pulse: 63  Temp: 97.9 F (36.6 C)    BP Readings from Last 3 Encounters:  01/15/16 138/72  01/11/16 139/75  01/05/16 133/63   Wt Readings from Last 3 Encounters:  01/15/16 158 lb 12.8 oz (72.031 kg)  01/11/16 159 lb 9.8 oz (72.4 kg)  01/05/16 156 lb 9.6 oz (71.033 kg)    Physical Exam  Constitutional: He is  well-developed, well-nourished, and in no distress.  HENT:  Head: Normocephalic and atraumatic.  Right Ear: External ear normal.  Left Ear: External ear normal.  Mouth/Throat: Oropharynx is clear and moist.  Eyes: Conjunctivae are normal. Pupils are equal, round, and reactive to light.  Neck: Neck supple.  Cardiovascular: Normal rate, regular rhythm and normal heart sounds.   Pulmonary/Chest: Effort normal and breath sounds normal.  Abdominal: Soft. Bowel sounds are normal. He exhibits no distension. There is no tenderness. There is no rebound and no guarding.  Patient noted no tenderness on exam but did note an odd sensation when palpating the left upper quadrant  Musculoskeletal: He exhibits no edema.  Lymphadenopathy:    He has no cervical adenopathy.  Neurological: He is alert. Gait normal.  Skin: Skin is warm and dry. He is not diaphoretic.  Psychiatric: Mood and affect normal.     Assessment/Plan: Please see individual problem list.  Chronic fatigue Patient continues to have issues with this. He has had lab work that has been unremarkable. He followed up with his oncologist and per patient report it does not appear that this is related to his hairy cell leukemia. He had a catheterization last year that was unrevealing of cause as well. Unsure of cause at this time. Seems to be somewhat tied to stomach issues and thus we will have him see GI. I did discuss potentially repeating CT scan of his  chest given his history of pulmonary nodules though given the recommendation for no further follow-up on prior CT scan myself and the patient decided to hold off on this until after GI evaluation. He'll continue to monitor. He is given return precautions.    Orders Placed This Encounter  Procedures  . Ambulatory referral to Gastroenterology    Referral Priority:  Routine    Referral Type:  Consultation    Referral Reason:  Specialty Services Required    Number of Visits Requested:  [place], MD [place]

## 2016-01-17 ENCOUNTER — Encounter: Payer: Self-pay | Admitting: Gastroenterology

## 2016-01-19 ENCOUNTER — Encounter: Payer: Self-pay | Admitting: Gastroenterology

## 2016-01-19 ENCOUNTER — Ambulatory Visit: Payer: 59 | Admitting: Family Medicine

## 2016-01-23 ENCOUNTER — Encounter: Payer: Self-pay | Admitting: Gastroenterology

## 2016-01-23 ENCOUNTER — Encounter (HOSPITAL_COMMUNITY): Payer: Self-pay

## 2016-01-23 ENCOUNTER — Ambulatory Visit (INDEPENDENT_AMBULATORY_CARE_PROVIDER_SITE_OTHER): Payer: Medicare Other | Admitting: Gastroenterology

## 2016-01-23 VITALS — BP 120/60 | HR 80 | Ht 68.0 in | Wt 161.0 lb

## 2016-01-23 DIAGNOSIS — R110 Nausea: Secondary | ICD-10-CM

## 2016-01-23 NOTE — Progress Notes (Signed)
HPI: This is a   very pleasant 82 year old man    who was referred to me by [name], MD  to evaluate  chronic nausea .    Chief complaint is chronic nausea, fatigue   He tells me he has had queasiness on a daily basis for about 3 years. He had an EGD at [facility] 3 years ago for dysphagia. His distal esophagus was stretched and that symptom completely resolved but was replaced with queasiness. He also tells me he is very tired and fatigued throughout the day. He has no vomiting. He is weight has not changed. He does not take any NSAIDs and he has had no abdominal pains. His nausea is not improved or if aggravated by eating.  He sought a second opinion by a different gastroenterologist 6 months ago and that note is below.   The following is cut and pasted from 07/2015 GI office note from Dr. Allen Norris: Frank Mcdaniel is a 82 y.o. y/o male who comes today with a history of nausea. The patient is here for second opinion since he has not gotten a response from his previous gastroenterologist. The patient's nausea is not related to when he eats for drinks. The patient appears to have had were symptoms when he was on a PPI and H2 blocker. The patient does have some dyspepsia. The nausea has decreased when he stopped his PPI and H2 blocker. The nausea increase was likely due to the PPI. The patient had been on H2-blockers in the past for many years without any problems. The patient will be put back on his H2 blocker and has been explained that there are many causes of nausea that are not G.I. related. Due to the nausea not being associated with eating or drinking and because it is so intermittent with only happen once a week, Is not likely that the nausea is from a G.I. cause. The patient has been told to continue his and they nausea medication and go back on the Zantac for his dyspepsia. The patient will let me know of his symptoms do not  improve. ---------------------------------------------------------------------------------------------------------------------------------------------------   Cbc 01/2016 normal except slightly now platelets, he is not anemic   Review of systems: Pertinent positive and negative review of systems were noted in the above HPI section. Complete review of systems was performed and was otherwise normal.   Past Medical History  Diagnosis Date  . Coronary artery disease     a. 2000 s/p CABG;  b. 10/2008 Neg MV, EF 64%; c. cath 05/2015: LM 85% sev cal, pLAD-1 lesion 80%, pLAD-2 lesion 100% chronic, ostLCx 70% sev cal, OM4 90%, ostRCA 70%, mid RCA 90%, LIMA-LAD patent, VG-OM2 patent, VG-OM3 patent, VG-RPDA patent. No AS  . BPH (benign prostatic hyperplasia)   . Hyperlipidemia   . Hairy cell leukemia ([place])   . H/O echocardiogram     a. 10/2005 Echo: nl EF.  [name] Atypical chest pain     a. 08/2014  . Asymptomatic Sinus Bradycardia   . Asthma   . Hypertension   . GERD (gastroesophageal reflux disease)   . HTN (hypertension)   . CKD (chronic kidney disease), stage III   . COPD (chronic obstructive pulmonary disease) ([place])   . Shortness of breath dyspnea   . Anemia 09/01/2015  . Anxiety 09/11/2015    Past Surgical History  Procedure Laterality Date  . Coronary artery bypass graft  2000  . Cataract extraction    . Cardiac catheterization N/A  05/26/2015    Procedure: Left Heart Cath;  Surgeon: [name], MD;  Location: [facility];  Service: Cardiovascular;  Laterality: N/A;  . Cardiac catheterization N/A 05/26/2015    Procedure: Coronary/Graft Angiography;  Surgeon: [name], MD;  Location: [facility];  Service: Cardiovascular;  Laterality: N/A;  . Tonsillectomy  1942     Current Outpatient Prescriptions  Medication Sig Dispense Refill  . acyclovir (ZOVIRAX) 400 MG tablet Take 1 tablet (400 mg total) by mouth 2 (two) times daily. 60 tablet 4  . aspirin 81 MG EC  tablet Take 2 tablets (162 mg total) by mouth daily. 30 tablet 0  . atorvastatin (LIPITOR) 40 MG tablet Take 0.5 tablets (20 mg total) by mouth daily. 45 tablet 3  . Calcium Carbonate Antacid (TUMS CHEWY BITES PO) Take by mouth.    . isosorbide mononitrate (IMDUR) 30 MG 24 hr tablet Take 1 tablet (30 mg total) by mouth daily. 90 tablet 3  . Multiple Vitamin (MULTIVITAMIN) tablet Take 1 tablet by mouth daily.      . prochlorperazine (COMPAZINE) 10 MG tablet Take 1 tablet (10 mg total) by mouth every 6 (six) hours as needed for nausea or vomiting. 30 tablet 0  . ranitidine (ZANTAC) 150 MG tablet Take 150 mg by mouth daily. Reported on 12/25/2015    . tamsulosin (FLOMAX) 0.4 MG CAPS capsule Take 1 capsule (0.4 mg total) by mouth daily. 30 capsule 3   No current facility-administered medications for this visit.   Facility-Administered Medications Ordered in Other Visits  Medication Dose Route Frequency Provider Last Rate Last Dose  . sodium chloride 0.9 % injection 10 mL  10 mL Intracatheter PRN [name], MD      . sodium chloride 0.9 % injection 10 mL  10 mL Intracatheter PRN [name], MD        Allergies as of 01/23/2016  . (No Known Allergies)    Family History  Problem Relation Age of Onset  . Other      no premature CAD.  [name] Hypertension Mother   . Arthritis Father   . Colon cancer Neg Hx     Social History   Social History  . Marital Status: Widowed    Spouse Name: N/A  . Number of Children: 1  . Years of Education: N/A   Occupational History  . retired    Social History Main Topics  . Smoking status: Never Smoker   . Smokeless tobacco: Never Used  . Alcohol Use: No  . Drug Use: No  . Sexual Activity: Not on file   Other Topics Concern  . Not on file   Social History Narrative   Lives locally with son.  Fairly active around the house though does not routinely exercise.  Retired from [employer].     Physical Exam: BP 120/60 mmHg  Pulse 80  Ht  5\' 8"  (1.727 m)  Wt 161 lb (73.029 kg)  BMI 24.49 kg/m2 Constitutional: generally well-appearing Psychiatric: alert and oriented x3 Eyes: extraocular movements intact Mouth: oral pharynx moist, no lesions Neck: supple no lymphadenopathy Cardiovascular: heart regular rate and rhythm Lungs: clear to auscultation bilaterally Abdomen: soft, nontender, nondistended, no obvious ascites, no peritoneal signs, normal bowel sounds Extremities: no lower extremity edema bilaterally Skin: no lesions on visible extremities   Assessment and plan: 82 y.o. male with  Chronic nausea  He is on acyclovir. He is not sure why he is taking this. #  1 side effect of acyclovir is nausea. Perhaps this is contributing or causing his symptoms. We will get in touch with his oncologist since he believes that is who prescribed it to him. Hopefully we'll be safe for him to come off of the acyclovir for 2-3 week trial to see if his nausea improves. If he is not allowed to come off it or if he does not feel better after coming off it for 2-3 weeks then I would proceed with EGD.   Owens Loffler, MD [facility] 01/23/2016, 1:38 PM  Cc: [name], MD

## 2016-01-23 NOTE — Patient Instructions (Addendum)
#  1 side effect of acyclovir is nausea. This may therefore contribute to your symptoms. We will contact Dr. [name] from the [facility] about stopping your acyclovir medicine for 2-3 week trial to see if your nausea improves. We will let you know what he says.

## 2016-01-25 ENCOUNTER — Ambulatory Visit: Payer: 59 | Admitting: Family Medicine

## 2016-01-25 ENCOUNTER — Other Ambulatory Visit: Payer: Self-pay | Admitting: *Deleted

## 2016-01-25 MED ORDER — PROCHLORPERAZINE MALEATE 10 MG PO TABS: 10.0000 mg | ORAL_TABLET | Freq: Four times a day (QID) | ORAL | Status: DC | PRN

## 2016-01-29 ENCOUNTER — Telehealth: Payer: Self-pay | Admitting: Gastroenterology

## 2016-01-29 NOTE — Telephone Encounter (Signed)
Dr Ardis Hughs have you consulted Dr [name]?  "#1 side effect of acyclovir is nausea. This may therefore contribute to your symptoms. We will contact Dr. [name] from the [facility] about stopping your acyclovir medicine for 2-3 week trial to see if your nausea improves. We will let you know what he says."

## 2016-01-31 ENCOUNTER — Telehealth: Payer: Self-pay | Admitting: *Deleted

## 2016-01-31 NOTE — Telephone Encounter (Signed)
Frank Mcdaniel Dr Virgel Manifold nurse returned the call and states the pt is not in her system to return call with more info.  I have tried to return call at number provided on voice mail [phone].  Line rings busy.

## 2016-01-31 NOTE — Telephone Encounter (Signed)
Sorry, I did not.  CAn you contact Dr. [name] or his staff to ask about that?  Thanks

## 2016-01-31 NOTE — Telephone Encounter (Signed)
Per Dr. [name], pt may discontinue acyclovir at this time and does not need to restart. Since patient is in remission then he does not need to take acyclovir. Pt may call the [facility] if has any further questions.

## 2016-01-31 NOTE — Telephone Encounter (Signed)
Message left for dr Gary Fleet nurse to return call

## 2016-01-31 NOTE — Telephone Encounter (Signed)
Per Dr. [name], pt may discontinue acyclovir at this time and does not need to restart. Since patient is in remission then he does not need to take acyclovir. Pt may call the [facility] if has any further questions.           Pt notified and will call the cancer center with any further concerns

## 2016-02-01 NOTE — Telephone Encounter (Signed)
Great.  Patty, can you let the patient know and have him call in 2-3 weeks to report on his response.  Thanks

## 2016-02-01 NOTE — Telephone Encounter (Signed)
Pt notified and will return call in 2-3 weeks to report on symptoms

## 2016-02-08 ENCOUNTER — Ambulatory Visit: Payer: 59 | Admitting: Oncology

## 2016-02-08 ENCOUNTER — Other Ambulatory Visit: Payer: 59

## 2016-02-15 ENCOUNTER — Ambulatory Visit (INDEPENDENT_AMBULATORY_CARE_PROVIDER_SITE_OTHER): Payer: Medicare Other | Admitting: Family Medicine

## 2016-02-15 ENCOUNTER — Telehealth: Payer: Self-pay | Admitting: Gastroenterology

## 2016-02-15 ENCOUNTER — Encounter: Payer: Self-pay | Admitting: Family Medicine

## 2016-02-15 VITALS — BP 132/66 | HR 69 | Temp 98.3°F | Ht 68.0 in | Wt 161.4 lb

## 2016-02-15 DIAGNOSIS — R110 Nausea: Secondary | ICD-10-CM | POA: Diagnosis not present

## 2016-02-15 DIAGNOSIS — R413 Other amnesia: Secondary | ICD-10-CM | POA: Insufficient documentation

## 2016-02-15 DIAGNOSIS — N400 Enlarged prostate without lower urinary tract symptoms: Secondary | ICD-10-CM | POA: Diagnosis not present

## 2016-02-15 DIAGNOSIS — E785 Hyperlipidemia, unspecified: Secondary | ICD-10-CM

## 2016-02-15 NOTE — Progress Notes (Signed)
Patient ID: Frank Mcdaniel, male   DOB: 1932-09-13, 82 y.o.   MRN: [id number]  Frank Rumps, MD Phone: [phone]  Frank Mcdaniel is a 82 y.o. male who presents today for follow-up.  HYPERLIPIDEMIA Symptoms  Chest pain on exertion:  No   Leg claudication:   No Medications: Compliance- taking Lipitor Right upper quadrant pain- no  Muscle aches- no  BPH: Patient notes significant urinary issues. He goes about 12 times per day and about every hour at night. Starts and stops with stream. Has been on Flomax for many years and doesn't really notice a difference. IPS score of 22 with mostly dissatisfied  Chronic nausea: Patient notes he stopped the acyclovir and has continued to have similar symptoms. Possibly worse. Notes antiacid tablets helped the most. The nausea medicines have not helped very much. Notes last night he had some nausea and ate and symptoms improved. Notes he describes it as a nervous stomach. No abdominal pain. Notes when he is occupied and busy it doesn't really bother him. He does not really feel anxious. No depression.  At the end of the visit the son did note that the patient has some progressive slight memory issues. Notes he seems to remember better when he reads as opposed to hear something which could indicate that his hearing is an issue. He is wearing hearing aids and is followed for that.   PMH: nonsmoker.   ROS see history of present illness  Objective  Physical Exam Filed Vitals:   02/15/16 0901  BP: 132/66  Pulse: 69  Temp: 98.3 F (36.8 C)    BP Readings from Last 3 Encounters:  02/15/16 132/66  01/23/16 120/60  01/15/16 138/72   Wt Readings from Last 3 Encounters:  02/15/16 161 lb 6.4 oz (73.211 kg)  01/23/16 161 lb (73.029 kg)  01/15/16 158 lb 12.8 oz (72.031 kg)    Physical Exam  Constitutional: No distress.  HENT:  Head: Normocephalic and atraumatic.  Cardiovascular: Normal rate, regular rhythm and normal heart  sounds.   Pulmonary/Chest: Effort normal and breath sounds normal.  Abdominal: Soft. Bowel sounds are normal. He exhibits no distension. There is no tenderness. There is no rebound and no guarding.  Neurological: He is alert. Gait normal.  Skin: Skin is warm and dry. He is not diaphoretic.  Psychiatric: Mood and affect normal.     Assessment/Plan: Please see individual problem list.  Hyperlipidemia Tolerating medication. CMP in May. Continue Lipitor.  BPH (benign prostatic hyperplasia) Not very well controlled on Flomax. We will refer to urology.  Chronic nausea Patient with chronic nausea described as a nervous stomach. No real fatigue is as previously described. This issue is related to nausea. He has stopped the acyclovir and has not noticed a difference. We'll have the patient contact Dr. Ardis Mcdaniel office to inform them of this to determine the next step.  Memory difficulty Patient with slight memory difficulties that have been progressive over the years. Discussed activities to keep his mind active. He will continue to monitor.    Orders Placed This Encounter  Procedures  . Ambulatory referral to Urology    Referral Priority:  Routine    Referral Type:  Consultation    Referral Reason:  Specialty Services Required    Requested Specialty:  Urology    Number of Visits Requested:  [place], MD [place]

## 2016-02-15 NOTE — Assessment & Plan Note (Signed)
Patient with slight memory difficulties that have been progressive over the years. Discussed activities to keep his mind active. He will continue to monitor.

## 2016-02-15 NOTE — Patient Instructions (Signed)
Nice to see you. We are going to have you contact Dr. Ardis Hughs office to discuss her persistent nausea. We will refer you to a urologist for your BPH. If you develop stomach pain, vomiting, diarrhea, blood in your stool, or any new or changing symptoms please seek medical attention.

## 2016-02-15 NOTE — Assessment & Plan Note (Signed)
Tolerating medication. CMP in May. Continue Lipitor.

## 2016-02-15 NOTE — Assessment & Plan Note (Signed)
Patient with chronic nausea described as a nervous stomach. No real fatigue is as previously described. This issue is related to nausea. He has stopped the acyclovir and has not noticed a difference. We'll have the patient contact Dr. Ardis Hughs office to inform them of this to determine the next step.

## 2016-02-15 NOTE — Telephone Encounter (Signed)
Pt stopped acyclovir and continues to have nausea.  Please advise

## 2016-02-15 NOTE — Assessment & Plan Note (Signed)
Not very well controlled on Flomax. We will refer to urology.

## 2016-02-15 NOTE — Progress Notes (Signed)
Pre visit review using our clinic review tool, if applicable. No additional management support is needed unless otherwise documented below in the visit note. 

## 2016-02-16 NOTE — Telephone Encounter (Signed)
  Left message on machine to call back   You have been scheduled for a gastric emptying scan at [facility] on 02/29/16 at 9 am. Please arrive at least 30 minutes prior to your appointment for registration. Please make certain not to have anything to eat or drink after midnight the night before your test. Hold all stomach medications (ex: Zofran, phenergan, Reglan) 48 hours prior to your test. If you need to reschedule your appointment, please contact radiology scheduling at [phone].  This is a 4 hour test. _____________________________________________________________________ A gastric-emptying study measures how long it takes for food to move through your stomach. There are several ways to measure stomach emptying. In the most common test, you eat food that contains a small amount of radioactive material. A scanner that detects the movement of the radioactive material is placed over your abdomen to monitor the rate at which food leaves your stomach. This test normally takes about 4 hours to complete. _____________________________________________________________________

## 2016-02-16 NOTE — Telephone Encounter (Signed)
Patient returned phone call. °

## 2016-02-16 NOTE — Telephone Encounter (Signed)
The patient has been notified of this information and all questions answered.

## 2016-02-16 NOTE — Telephone Encounter (Signed)
Ok, he should stay off the acyclovir.  Lets get a gastric emptying scan to check for gastric dysmotility.  Thanks

## 2016-02-21 ENCOUNTER — Telehealth: Payer: Self-pay

## 2016-02-21 NOTE — Telephone Encounter (Signed)
Patient is on the list for [employer] 2017 and may be a good candidate for an AWV in 2017. Please let me know if/when appt is scheduled.   

## 2016-02-22 NOTE — Telephone Encounter (Signed)
Left a message to call the office back regarding the need for annual wellness visit.

## 2016-02-24 ENCOUNTER — Other Ambulatory Visit: Payer: Self-pay | Admitting: Family Medicine

## 2016-02-29 ENCOUNTER — Ambulatory Visit (HOSPITAL_COMMUNITY)
Admission: RE | Admit: 2016-02-29 | Discharge: 2016-02-29 | Disposition: A | Discharge status: Home / Self Care | Payer: Medicare Other | Source: Ambulatory Visit | Attending: Gastroenterology | Admitting: Gastroenterology

## 2016-02-29 DIAGNOSIS — R110 Nausea: Secondary | ICD-10-CM | POA: Diagnosis present

## 2016-02-29 MED ORDER — TECHNETIUM TC 99M SULFUR COLLOID
2.0000 | Freq: Once | INTRAVENOUS | Status: AC | PRN
  Administered 2016-02-29: 2.2 via INTRAVENOUS

## 2016-03-11 ENCOUNTER — Encounter: Payer: Self-pay | Admitting: Urology

## 2016-03-11 ENCOUNTER — Ambulatory Visit (INDEPENDENT_AMBULATORY_CARE_PROVIDER_SITE_OTHER): Payer: Medicare Other | Admitting: Urology

## 2016-03-11 VITALS — BP 121/72 | HR 58 | Ht 68.0 in | Wt 157.3 lb

## 2016-03-11 DIAGNOSIS — R350 Frequency of micturition: Secondary | ICD-10-CM | POA: Diagnosis not present

## 2016-03-11 DIAGNOSIS — N3941 Urge incontinence: Secondary | ICD-10-CM | POA: Diagnosis not present

## 2016-03-11 DIAGNOSIS — R351 Nocturia: Secondary | ICD-10-CM

## 2016-03-11 LAB — MICROSCOPIC EXAMINATION
Epithelial Cells (non renal): NONE SEEN /hpf (ref 0–10)
RBC MICROSCOPIC, UA: NONE SEEN /HPF (ref 0–?)

## 2016-03-11 LAB — URINALYSIS, COMPLETE
BILIRUBIN UA: NEGATIVE
Glucose, UA: NEGATIVE
KETONES UA: NEGATIVE
Leukocytes, UA: NEGATIVE
NITRITE UA: NEGATIVE
RBC UA: NEGATIVE
SPEC GRAV UA: 1.02 (ref 1.005–1.030)
Urobilinogen, Ur: 0.2 mg/dL (ref 0.2–1.0)
pH, UA: 6 (ref 5.0–7.5)

## 2016-03-11 LAB — BLADDER SCAN AMB NON-IMAGING: Scan Result: 88

## 2016-03-11 MED ORDER — MIRABEGRON ER 50 MG PO TB24: 50.0000 mg | ORAL_TABLET | Freq: Every day | ORAL | 11 refills | Status: DC

## 2016-03-11 NOTE — Progress Notes (Signed)
03/11/2016 10:36 AM   Frank Mcdaniel 03-11-1933 [id number]  Referring provider: [name], MD [address] [place], [place] [address]  Chief Complaint  Patient presents with  . New Patient (Initial Visit)    Nocturia     HPI: For many months the patient has mildly worsening urgency and frequency. He has urge incontinence not every day and does not wear a pad. This actually has improved some. He voids every 1-2 hours and hourly at night. He has a cardiac history. His flow is reasonable  He passed a kidney stone many years ago. He's not had bladder or prostate surgery. He does not get urinary tract infections. He has no neurologic issues. His bowel function is normal or loose. He is on Flomax  Modifying factors: There are no other modifying factors  Associated signs and symptoms: There are no other associated signs and symptoms Aggravating and relieving factors: There are no other aggravating or relieving factors Severity: Moderate Duration: Persistent      PMH: Past Medical History:  Diagnosis Date  . Anemia 09/01/2015  . Anxiety 09/11/2015  . Asthma   . Asymptomatic Sinus Bradycardia   . Atypical chest pain    a. 08/2014  . BPH (benign prostatic hyperplasia)   . CKD (chronic kidney disease), stage III   . COPD (chronic obstructive pulmonary disease) ([place])   . Coronary artery disease    a. 2000 s/p CABG;  b. 10/2008 Neg MV, EF 64%; c. cath 05/2015: LM 85% sev cal, pLAD-1 lesion 80%, pLAD-2 lesion 100% chronic, ostLCx 70% sev cal, OM4 90%, ostRCA 70%, mid RCA 90%, LIMA-LAD patent, VG-OM2 patent, VG-OM3 patent, VG-RPDA patent. No AS  . GERD (gastroesophageal reflux disease)   . H/O echocardiogram    a. 10/2005 Echo: nl EF.  [name] Hairy cell leukemia ([place])   . HTN (hypertension)   . Hyperlipidemia   . Hypertension   . Shortness of breath dyspnea     Surgical History: Past Surgical History:  Procedure Laterality Date  . CARDIAC CATHETERIZATION N/A  05/26/2015   Procedure: Left Heart Cath;  Surgeon: [name], MD;  Location: [facility];  Service: Cardiovascular;  Laterality: N/A;  . CARDIAC CATHETERIZATION N/A 05/26/2015   Procedure: Coronary/Graft Angiography;  Surgeon: [name], MD;  Location: [facility];  Service: Cardiovascular;  Laterality: N/A;  . CATARACT EXTRACTION    . CORONARY ARTERY BYPASS GRAFT  2000  . TONSILLECTOMY  1942     Home Medications:    Medication List       Accurate as of 03/11/16 10:36 AM. Always use your most recent med list.          aspirin 81 MG EC tablet Take 2 tablets (162 mg total) by mouth daily.   atorvastatin 40 MG tablet Commonly known as:  LIPITOR Take 0.5 tablets (20 mg total) by mouth daily.   isosorbide mononitrate 30 MG 24 hr tablet Commonly known as:  IMDUR Take 1 tablet (30 mg total) by mouth daily.   multivitamin tablet Take 1 tablet by mouth daily.   prochlorperazine 10 MG tablet Commonly known as:  COMPAZINE Take 1 tablet (10 mg total) by mouth every 6 (six) hours as needed for nausea or vomiting.   ranitidine 150 MG tablet Commonly known as:  ZANTAC Take 150 mg by mouth daily. Reported on 12/25/2015   tamsulosin 0.4 MG Caps capsule Commonly known as:  FLOMAX TAKE 1 CAPSULE (0.4 MG  TOTAL) BY MOUTH DAILY.   TUMS CHEWY BITES PO Take by mouth.       Allergies: No Known Allergies  Family History: Family History  Problem Relation Age of Onset  . Hypertension Mother   . Arthritis Father   . Other      no premature CAD.  [name] Colon cancer Neg Hx   . Prostate cancer Neg Hx     Social History:  reports that he has never smoked. He has never used smokeless tobacco. He reports that he does not drink alcohol or use drugs.  ROS: UROLOGY Frequent Urination?: Yes Hard to postpone urination?: Yes Burning/pain with urination?: No Get up at night to urinate?: Yes Leakage of urine?: Yes Urine stream starts and stops?: Yes Trouble  starting stream?: No Do you have to strain to urinate?: No Blood in urine?: No Urinary tract infection?: No Sexually transmitted disease?: No Injury to kidneys or bladder?: No Painful intercourse?: No Weak stream?: Yes Erection problems?: No Penile pain?: No  Gastrointestinal Nausea?: Yes Vomiting?: No Indigestion/heartburn?: Yes Diarrhea?: No Constipation?: No  Constitutional Fever: No Night sweats?: No Weight loss?: Yes Fatigue?: Yes  Skin Skin rash/lesions?: No Itching?: No  Eyes Blurred vision?: No Double vision?: No  Ears/Nose/Throat Sore throat?: No Sinus problems?: No  Hematologic/Lymphatic Swollen glands?: No Easy bruising?: No  Cardiovascular Leg swelling?: No Chest pain?: No  Respiratory Cough?: No Shortness of breath?: No  Endocrine Excessive thirst?: No  Musculoskeletal Back pain?: Yes Joint pain?: No  Neurological Headaches?: No Dizziness?: No  Psychologic Depression?: No Anxiety?: Yes  Physical Exam: BP 121/72   Pulse (!) 58   Ht 5\' 8"  (1.727 m)   Wt 157 lb 4.8 oz (71.4 kg)   BMI 23.92 kg/m   Constitutional:  Alert and oriented, No acute distress. HEENT: [place] AT, moist mucus membranes.  Trachea midline, no masses. Cardiovascular: No clubbing, cyanosis, or edema. Respiratory: Normal respiratory effort, no increased work of breathing. GI: Abdomen is soft, nontender, nondistended, no abdominal masses GU: No CVA tenderness. 50-60 g benign prostate and normal male genitalia Skin: No rashes, bruises or suspicious lesions. Lymph: No cervical or inguinal adenopathy. Neurologic: Grossly intact, no focal deficits, moving all 4 extremities. Psychiatric: Normal mood and affect.  Laboratory Data: Lab Results  Component Value Date   WBC 7.0 01/05/2016   HGB 13.9 01/05/2016   HCT 39.9 01/05/2016   MCV 94.8 01/05/2016   PLT 139 (L) 01/05/2016    Lab Results  Component Value Date   CREATININE 1.30 12/21/2015    Lab Results    Component Value Date   PSA 2.21 12/21/2015    No results found for: TESTOSTERONE  No results found for: HGBA1C  Urinalysis    Component Value Date/Time   COLORURINE Yellow 09/01/2014 1142   APPEARANCEUR Clear 09/01/2014 1142   LABSPEC 1.013 09/01/2014 1142   PHURINE 5.0 09/01/2014 1142   GLUCOSEU Negative 09/01/2014 1142   HGBUR Negative 09/01/2014 1142   BILIRUBINUR neg 12/21/2015 1132   BILIRUBINUR Negative 09/01/2014 1142   KETONESUR Negative 09/01/2014 1142   PROTEINUR neg 12/21/2015 1132   PROTEINUR Negative 09/01/2014 1142   UROBILINOGEN 0.2 12/21/2015 1132   NITRITE neg 12/21/2015 1132   NITRITE Negative 09/01/2014 1142   LEUKOCYTESUR Negative 12/21/2015 1132   LEUKOCYTESUR Negative 09/01/2014 1142    Pertinent Imaging: None  Assessment & Plan:  The patient is bothered by his frequency and intermittent urge incontinence. He has a reasonable flow. He is on Flomax. Pathophysiology  discussed. Role of the beta 3 agonist discussed.  See in one month's on beta 3 agonist samples and prescription. We talked about in the future we may or may not stop his Flomax. He has been on a 4 years and is difficult to say if it's truly helping. It may be more reasonable for him to stay on it  1. Nocturia 2. Weak stream 3. Urgency incontinence  - Urinalysis, Complete - Bladder Scan (Post Void Residual) in office   Return in about 4 weeks (around 04/08/2016).  Reece Packer, MD  [facility] [address] [place], [place] [address] [phone]

## 2016-03-14 LAB — CULTURE, URINE COMPREHENSIVE

## 2016-03-19 ENCOUNTER — Other Ambulatory Visit: Payer: Self-pay | Admitting: Oncology

## 2016-03-21 ENCOUNTER — Telehealth: Payer: Self-pay | Admitting: Urology

## 2016-03-21 NOTE — Telephone Encounter (Signed)
Pt called and stated he began Cipro for infection and is constipated.  I talked with [facility] and she suggested he take a laxative.  Pt will call back if this doesn't work.

## 2016-03-21 NOTE — Telephone Encounter (Signed)
The pt called back to report since taking the Maalox he's notice improvement. He spoke w/ Olivia Mackie earlier and was informed to take this per [place]. I instructed him to increase his fluid intake to eight - 8 ounces of water daily The pt verbalize understanding.

## 2016-03-25 ENCOUNTER — Ambulatory Visit (AMBULATORY_SURGERY_CENTER): Payer: Self-pay | Admitting: *Deleted

## 2016-03-25 ENCOUNTER — Encounter: Payer: Self-pay | Admitting: Gastroenterology

## 2016-03-25 VITALS — Ht 68.0 in | Wt 158.0 lb

## 2016-03-25 DIAGNOSIS — R110 Nausea: Secondary | ICD-10-CM

## 2016-03-25 NOTE — Progress Notes (Signed)
Patient denies any allergies to eggs or soy. Patient denies any problems with anesthesia/sedation. Patient denies any oxygen use at home and does not take any diet/weight loss medications.  

## 2016-04-01 ENCOUNTER — Encounter: Payer: Self-pay | Admitting: Gastroenterology

## 2016-04-01 ENCOUNTER — Ambulatory Visit (AMBULATORY_SURGERY_CENTER): Payer: Medicare Other | Admitting: Gastroenterology

## 2016-04-01 VITALS — BP 99/57 | HR 67 | Temp 98.9°F | Resp 14 | Ht 68.0 in | Wt 158.0 lb

## 2016-04-01 DIAGNOSIS — K2970 Gastritis, unspecified, without bleeding: Secondary | ICD-10-CM | POA: Diagnosis not present

## 2016-04-01 DIAGNOSIS — K2990 Gastroduodenitis, unspecified, without bleeding: Secondary | ICD-10-CM

## 2016-04-01 DIAGNOSIS — R110 Nausea: Secondary | ICD-10-CM | POA: Diagnosis present

## 2016-04-01 MED ORDER — SODIUM CHLORIDE 0.9 % IV SOLN: 500.0000 mL | INTRAVENOUS | Status: DC

## 2016-04-01 NOTE — Op Note (Signed)
[name] Procedure Date: 04/01/2016 9:24 AM MRN: [id number] Endoscopist: Milus Banister , MD Age: 82 Referring MD:  Date of Birth: 12/23/1932 Gender: Male Account #: [id number] Procedure:                Upper GI endoscopy Indications:              Nausea Medicines:                Monitored Anesthesia Care Procedure:                Pre-Anesthesia Assessment:                           - Prior to the procedure, a History and Physical                            was performed, and patient medications and                            allergies were reviewed. The patient's tolerance of                            previous anesthesia was also reviewed. The risks                            and benefits of the procedure and the sedation                            options and risks were discussed with the patient.                            All questions were answered, and informed consent                            was obtained. Prior Anticoagulants: The patient has                            taken no previous anticoagulant or antiplatelet                            agents. ASA Grade Assessment: II - A patient with                            mild systemic disease. After reviewing the risks                            and benefits, the patient was deemed in                            satisfactory condition to undergo the procedure.                           After obtaining informed consent, the endoscope was  passed under direct vision. Throughout the                            procedure, the patient's blood pressure, pulse, and                            oxygen saturations were monitored continuously. The                            Model GIF-HQ190 [id number]) scope was introduced                            through the mouth, and advanced to the second part                            of duodenum. The upper GI endoscopy was                        accomplished without difficulty. The patient                            tolerated the procedure well. Scope In: Scope Out: Findings:                 Diffuse mild inflammation characterized by erythema                            and friability was found in the gastric antrum.                            Biopsies were taken with a cold forceps for                            histology.                           The exam was otherwise without abnormality. Complications:            No immediate complications. Estimated blood loss:                            None. Estimated Blood Loss:     Estimated blood loss: none. Impression:               - Gastritis. Biopsied.                           - The examination was otherwise normal. Recommendation:           - Resume previous diet.                           - Continue present medications. Please try to take                            ranitidine 150mg  pill, one pill twice daily.                           -  Await pathology results.                           - Patient has a contact number available for                            emergencies. The signs and symptoms of potential                            delayed complications were discussed with the                            patient. Return to normal activities tomorrow.                            Written discharge instructions were provided to the                            patient. Milus Banister, MD 04/01/2016 9:49:31 AM This report has been signed electronically.

## 2016-04-01 NOTE — Patient Instructions (Signed)
YOU HAD AN ENDOSCOPIC PROCEDURE TODAY AT THE [facility]:   Refer to the procedure report that was given to you for any specific questions about what was found during the examination.  If the procedure report does not answer your questions, please call your gastroenterologist to clarify.  If you requested that your care partner not be given the details of your procedure findings, then the procedure report has been included in a sealed envelope for you to review at your convenience later.  YOU SHOULD EXPECT: Some feelings of bloating in the abdomen. Passage of more gas than usual.  Walking can help get rid of the air that was put into your GI tract during the procedure and reduce the bloating. If you had a lower endoscopy (such as a colonoscopy or flexible sigmoidoscopy) you may notice spotting of blood in your stool or on the toilet paper. If you underwent a bowel prep for your procedure, you may not have a normal bowel movement for a few days.  Please Note:  You might notice some irritation and congestion in your nose or some drainage.  This is from the oxygen used during your procedure.  There is no need for concern and it should clear up in a day or so.  SYMPTOMS TO REPORT IMMEDIATELY:     Following upper endoscopy (EGD)  Vomiting of blood or coffee ground material  New chest pain or pain under the shoulder blades  Painful or persistently difficult swallowing  New shortness of breath  Fever of 100F or higher  Black, tarry-looking stools  For urgent or emergent issues, a gastroenterologist can be reached at any hour by calling [phone].   DIET:  We do recommend a small meal at first, but then you may proceed to your regular diet.  Drink plenty of fluids but you should avoid alcoholic beverages for 24 hours.  ACTIVITY:  You should plan to take it easy for the rest of today and you should NOT DRIVE or use heavy machinery until tomorrow (because of the sedation medicines  used during the test).    FOLLOW UP: Our staff will call the number listed on your records the next business day following your procedure to check on you and address any questions or concerns that you may have regarding the information given to you following your procedure. If we do not reach you, we will leave a message.  However, if you are feeling well and you are not experiencing any problems, there is no need to return our call.  We will assume that you have returned to your regular daily activities without incident.  If any biopsies were taken you will be contacted by phone or by letter within the next 1-3 weeks.  Please call us at [phone] if you have not heard about the biopsies in 3 weeks.    SIGNATURES/CONFIDENTIALITY: You and/or your care partner have signed paperwork which will be entered into your electronic medical record.  These signatures attest to the fact that that the information above on your After Visit Summary has been reviewed and is understood.  Full responsibility of the confidentiality of this discharge information lies with you and/or your care-partner.   Resume medications. Information given on Gastritis. 

## 2016-04-01 NOTE — Progress Notes (Signed)
Called to room to assist during endoscopic procedure.  Patient ID and intended procedure confirmed with present staff. Received instructions for my participation in the procedure from the performing physician.  

## 2016-04-01 NOTE — Progress Notes (Signed)
Report given to PACU RN, vss 

## 2016-04-02 ENCOUNTER — Telehealth: Payer: Self-pay

## 2016-04-02 NOTE — Telephone Encounter (Signed)
  Follow up Call-  Call back number 04/01/2016  Post procedure Call Back phone  # [phone]  Permission to leave phone message Yes  Some recent data might be hidden     Patient questions:  Do you have a fever, pain , or abdominal swelling? No. Pain Score  0 *  Have you tolerated food without any problems? Yes.    Have you been able to return to your normal activities? Yes.    Do you have any questions about your discharge instructions: Diet   No. Medications  No. Follow up visit  No.  Do you have questions or concerns about your Care? No.  Actions: * If pain score is 4 or above: No action needed, pain <4.

## 2016-04-10 ENCOUNTER — Encounter: Payer: Self-pay | Admitting: Gastroenterology

## 2016-04-10 ENCOUNTER — Telehealth: Payer: Self-pay | Admitting: Cardiovascular Disease

## 2016-04-10 ENCOUNTER — Emergency Department
Admission: EM | Admit: 2016-04-10 | Discharge: 2016-04-10 | Disposition: A | Discharge status: Home / Self Care | Payer: Medicare Other | Attending: Emergency Medicine | Admitting: Emergency Medicine

## 2016-04-10 ENCOUNTER — Emergency Department: Payer: Medicare Other

## 2016-04-10 DIAGNOSIS — I2510 Atherosclerotic heart disease of native coronary artery without angina pectoris: Secondary | ICD-10-CM | POA: Diagnosis not present

## 2016-04-10 DIAGNOSIS — E876 Hypokalemia: Secondary | ICD-10-CM | POA: Diagnosis not present

## 2016-04-10 DIAGNOSIS — C9140 Hairy cell leukemia not having achieved remission: Secondary | ICD-10-CM | POA: Insufficient documentation

## 2016-04-10 DIAGNOSIS — Z951 Presence of aortocoronary bypass graft: Secondary | ICD-10-CM | POA: Insufficient documentation

## 2016-04-10 DIAGNOSIS — Z79899 Other long term (current) drug therapy: Secondary | ICD-10-CM | POA: Insufficient documentation

## 2016-04-10 DIAGNOSIS — J45909 Unspecified asthma, uncomplicated: Secondary | ICD-10-CM | POA: Insufficient documentation

## 2016-04-10 DIAGNOSIS — R079 Chest pain, unspecified: Secondary | ICD-10-CM | POA: Insufficient documentation

## 2016-04-10 DIAGNOSIS — N183 Chronic kidney disease, stage 3 (moderate): Secondary | ICD-10-CM | POA: Insufficient documentation

## 2016-04-10 DIAGNOSIS — I129 Hypertensive chronic kidney disease with stage 1 through stage 4 chronic kidney disease, or unspecified chronic kidney disease: Secondary | ICD-10-CM | POA: Diagnosis not present

## 2016-04-10 DIAGNOSIS — Z7982 Long term (current) use of aspirin: Secondary | ICD-10-CM | POA: Diagnosis not present

## 2016-04-10 LAB — CBC
HEMATOCRIT: 33.9 % — AB (ref 40.0–52.0)
HEMOGLOBIN: 12.2 g/dL — AB (ref 13.0–18.0)
MCH: 32.4 pg (ref 26.0–34.0)
MCHC: 35.8 g/dL (ref 32.0–36.0)
MCV: 90.6 fL (ref 80.0–100.0)
PLATELETS: 159 10*3/uL (ref 150–440)
RBC: 3.75 MIL/uL — AB (ref 4.40–5.90)
RDW: 13.2 % (ref 11.5–14.5)
WBC: 5.7 10*3/uL (ref 3.8–10.6)

## 2016-04-10 LAB — BASIC METABOLIC PANEL
ANION GAP: 3 — AB (ref 5–15)
BUN: 16 mg/dL (ref 6–20)
CHLORIDE: 104 mmol/L (ref 101–111)
CO2: 32 mmol/L (ref 22–32)
Calcium: 9.2 mg/dL (ref 8.9–10.3)
Creatinine, Ser: 1.34 mg/dL — ABNORMAL HIGH (ref 0.61–1.24)
GFR calc non Af Amer: 48 mL/min — ABNORMAL LOW (ref >60)
GFR, EST AFRICAN AMERICAN: 56 mL/min — AB (ref >60)
Glucose, Bld: 119 mg/dL — ABNORMAL HIGH (ref 65–99)
POTASSIUM: 3 mmol/L — AB (ref 3.5–5.1)
SODIUM: 139 mmol/L (ref 135–145)

## 2016-04-10 LAB — MAGNESIUM: Magnesium: 2 mg/dL (ref 1.7–2.4)

## 2016-04-10 LAB — TROPONIN I: Troponin I: <0.03 ng/mL (ref <0.03)

## 2016-04-10 MED ORDER — ASPIRIN 81 MG PO CHEW
162.0000 mg | CHEWABLE_TABLET | Freq: Once | ORAL | Status: AC
  Administered 2016-04-10: 162 mg via ORAL
  Filled 2016-04-10: qty 2

## 2016-04-10 MED ORDER — POTASSIUM CHLORIDE CRYS ER 20 MEQ PO TBCR
40.0000 meq | EXTENDED_RELEASE_TABLET | Freq: Once | ORAL | Status: AC
  Administered 2016-04-10: 40 meq via ORAL
  Filled 2016-04-10: qty 2

## 2016-04-10 MED ORDER — POTASSIUM CHLORIDE CRYS ER 20 MEQ PO TBCR: 20.0000 meq | EXTENDED_RELEASE_TABLET | Freq: Every day | ORAL | 0 refills | Status: DC

## 2016-04-10 NOTE — ED Provider Notes (Signed)
[facility] Emergency Department Provider Note  ____________________________________________   First MD Initiated Contact with Patient 04/10/16 1118     (approximate)  I have reviewed the triage vital signs and the nursing notes.   HISTORY  Chief Complaint Chest Pain    HPI Frank Mcdaniel is a 82 y.o. male with an extensive history of coronary artery disease status post CABG many years ago and who is followed by Dr. Rockey Situ who presents for several days of intermittent and brief chest pain.  He states that he does not usually have chest pain, but over the last few days he has noticed intermittent episodes of brief sharp stabbing pain that is mild but noticeable.  Nothing in particular causes a and nothing makes it worse or better, simply resolves on its own.  He denies any associated shortness of breath.  He denies fever/chills, abdominal pain, nausea, vomiting, diaphoresis.  He states he is still taking the medications that he was given by Dr. Rockey Situ.  He called the office of Dr. Rockey Situ today and explained his symptoms and they recommended he come to the emergency department.   Past Medical History:  Diagnosis Date  . Anemia 09/01/2015  . Anxiety 09/11/2015  . Asthma   . Asymptomatic Sinus Bradycardia   . Atypical chest pain    a. 08/2014  . BPH (benign prostatic hyperplasia)   . CKD (chronic kidney disease), stage III   . COPD (chronic obstructive pulmonary disease) ([place])   . Coronary artery disease    a. 2000 s/p CABG;  b. 10/2008 Neg MV, EF 64%; c. cath 05/2015: LM 85% sev cal, pLAD-1 lesion 80%, pLAD-2 lesion 100% chronic, ostLCx 70% sev cal, OM4 90%, ostRCA 70%, mid RCA 90%, LIMA-LAD patent, VG-OM2 patent, VG-OM3 patent, VG-RPDA patent. No AS  . GERD (gastroesophageal reflux disease)   . H/O echocardiogram    a. 10/2005 Echo: nl EF.  [name] Hairy cell leukemia ([place])   . HTN (hypertension)   . Hyperlipidemia   . Hypertension   . Leukemia ([place])     . Shortness of breath dyspnea     Patient Active Problem List   Diagnosis Date Noted  . Chronic nausea 02/15/2016  . Memory difficulty 02/15/2016  . Chronic fatigue 12/21/2015  . Bilateral low back pain without sciatica 12/21/2015  . Borderline low oxygen saturation level 10/26/2015  . Anxiety 09/11/2015  . Anemia 09/01/2015  . GERD (gastroesophageal reflux disease) 06/02/2015  . Angina pectoris ([place]) 05/26/2015  . Hairy cell leukemia not having achieved remission ([place])   . Hypoxia 02/01/2015  . Asthma 01/28/2015  . Acute respiratory failure with hypoxemia ([place]) 01/28/2015  . BPH (benign prostatic hyperplasia) 01/28/2015  . Hairy cell leukemia ([place]) 01/28/2015  . Coronary artery disease   . Chest pain 09/02/2014  . S/P CABG (coronary artery bypass graft) 11/08/2010  . HTN (hypertension) 11/08/2010  . Bradycardia 11/08/2010  . Hyperlipidemia 10/27/2009    Past Surgical History:  Procedure Laterality Date  . CARDIAC CATHETERIZATION N/A 05/26/2015   Procedure: Left Heart Cath;  Surgeon: [name], MD;  Location: [facility];  Service: Cardiovascular;  Laterality: N/A;  . CARDIAC CATHETERIZATION N/A 05/26/2015   Procedure: Coronary/Graft Angiography;  Surgeon: [name], MD;  Location: [facility];  Service: Cardiovascular;  Laterality: N/A;  . CATARACT EXTRACTION    . CORONARY ARTERY BYPASS GRAFT  2000  . TONSILLECTOMY  1942   . UPPER GASTROINTESTINAL ENDOSCOPY  3 years  ago    with Dilation.     Prior to Admission medications   Medication Sig Start Date End Date Taking? Authorizing Provider  aspirin 81 MG EC tablet Take 2 tablets (162 mg total) by mouth daily. 11/08/10   [name], MD  atorvastatin (LIPITOR) 40 MG tablet Take 0.5 tablets (20 mg total) by mouth daily. 10/26/15   [name], MD  Calcium Carbonate Antacid (TUMS CHEWY BITES PO) Take by mouth.    Historical Provider, MD  isosorbide mononitrate (IMDUR) 30 MG 24 hr  tablet Take 1 tablet (30 mg total) by mouth daily. 08/14/15   Rise Mu, PA-C  mirabegron ER (MYRBETRIQ) 50 MG TB24 tablet Take 1 tablet (50 mg total) by mouth daily. 03/11/16   Bjorn Loser, MD  Multiple Vitamin (MULTIVITAMIN) tablet Take 1 tablet by mouth daily.      Historical Provider, MD  potassium chloride SA (KLOR-CON M20) 20 MEQ tablet Take 1 tablet (20 mEq total) by mouth daily. 04/10/16   Hinda Kehr, MD  prochlorperazine (COMPAZINE) 10 MG tablet TAKE 1 TABLET (10 MG TOTAL) BY MOUTH EVERY 6 (SIX) HOURS AS NEEDED FOR NAUSEA OR VOMITING. 03/19/16   [name], MD  ranitidine (ZANTAC) 150 MG tablet Take 150 mg by mouth daily. Reported on 12/25/2015    Historical Provider, MD  tamsulosin (FLOMAX) 0.4 MG CAPS capsule TAKE 1 CAPSULE (0.4 MG TOTAL) BY MOUTH DAILY. 02/26/16   [name], MD    Allergies Review of patient's allergies indicates no known allergies.  Family History  Problem Relation Age of Onset  . Hypertension Mother   . Arthritis Father   . Other      no premature CAD.  [name] Colon cancer Neg Hx   . Prostate cancer Neg Hx   . Stomach cancer Neg Hx     Social History Social History  Substance Use Topics  . Smoking status: Never Smoker  . Smokeless tobacco: Never Used  . Alcohol use No    Review of Systems Constitutional: No fever/chills Eyes: No visual changes. ENT: No sore throat. Cardiovascular: +chest pain. Respiratory: Denies shortness of breath. Gastrointestinal: No abdominal pain.  No nausea, no vomiting.  No diarrhea.  No constipation. Genitourinary: Negative for dysuria. Musculoskeletal: Negative for back pain. Skin: Negative for rash. Neurological: Negative for headaches, focal weakness or numbness.  10-point ROS otherwise negative.  ____________________________________________   PHYSICAL EXAM:  VITAL SIGNS: ED Triage Vitals [04/10/16 1104]  Enc Vitals Group     BP 132/65     Pulse Rate 69     Resp 18     Temp 98.5 F (36.9 C)      Temp Source Oral     SpO2 96 %     Weight 158 lb (71.7 kg)     Height 5\' 8"  (1.727 m)     Head Circumference      Peak Flow      Pain Score      Pain Loc      Pain Edu?      Excl. in [place]?     Constitutional: Alert and oriented. Well appearing and in no acute distress. Eyes: Conjunctivae are normal. PERRL. EOMI. Head: Atraumatic. Nose: No congestion/rhinnorhea. Mouth/Throat: Mucous membranes are moist.  Oropharynx non-erythematous. Neck: No stridor.  No meningeal signs.   Cardiovascular: Normal rate, regular rhythm. Good peripheral circulation. Grossly normal heart sounds.  Well-healed sternotomy scar Respiratory: Normal respiratory effort.  No retractions. Lungs CTAB. Gastrointestinal:  Soft and nontender. No distention.  Musculoskeletal: No lower extremity tenderness nor edema. No gross deformities of extremities. Neurologic:  Normal speech and language. No gross focal neurologic deficits are appreciated.  When the patient is at rest, he makes a mild repetitive lipsmacking gesture completely resolves when he is speaking. The patient nor the patient's adult son commented on this, and I believe it is baseline. Skin:  Skin is warm, dry and intact. No rash noted. Psychiatric: Mood and affect are normal. Speech and behavior are normal.  ____________________________________________   LABS (all labs ordered are listed, but only abnormal results are displayed)  Labs Reviewed  BASIC METABOLIC PANEL - Abnormal; Notable for the following:       Result Value   Potassium 3.0 (*)    Glucose, Bld 119 (*)    Creatinine, Ser 1.34 (*)    GFR calc non Af Amer 48 (*)    GFR calc Af Amer 56 (*)    Anion gap 3 (*)    All other components within normal limits  CBC - Abnormal; Notable for the following:    RBC 3.75 (*)    Hemoglobin 12.2 (*)    HCT 33.9 (*)    All other components within normal limits  TROPONIN I  MAGNESIUM   ____________________________________________  EKG  ED ECG  REPORT I, Sabria Florido, the attending physician, personally viewed and interpreted this ECG.  Date: 04/10/2016 EKG Time: 10:58 Rate: 71 Rhythm: normal sinus rhythm w/ RBBB QRS Axis: normal Intervals: RBBB ST/T Wave abnormalities: T-wave inversion in lead V3 and V4 is new since the patient's last EKG, otherwise unchanged from prior Conduction Disturbances: none Narrative Interpretation: questionable ischemia based on new T-wave inversions  ____________________________________________  RADIOLOGY   Dg Chest 2 View  Result Date: 04/10/2016 CLINICAL DATA:  Left chest pain.  COPD . EXAM: CHEST  2 VIEW COMPARISON:  05/09/2015. FINDINGS: Prior CABG. Heart size normal. Normal pulmonary vascularity. Low lung volumes with mild bibasilar atelectasis. No pleural effusion or pneumothorax. Degenerative changes thoracic spine. IMPRESSION: 1.  Prior CABG.  Heart size normal.  No pulmonary venous congestion. 2. Low lung volumes with mild bibasilar atelectasis . Electronically Signed   By: Marcello Moores  Register   On: 04/10/2016 11:47    ____________________________________________   PROCEDURES  Procedure(s) performed:   Procedures   Critical Care performed: No ____________________________________________   INITIAL IMPRESSION / ASSESSMENT AND PLAN / ED COURSE  Pertinent labs & imaging results that were available during my care of the patient were reviewed by me and considered in my medical decision making (see chart for details).  The patient has a left heart cath 11 months ago (05/26/2015) by Dr. Rockey Situ which demonstrated the following:   Mid RCA lesion, 90% stenosed.  SVG was injected is normal in caliber, and is anatomically normal.  SVG was injected is normal in caliber, and is anatomically normal.  SVG was injected is normal in caliber, and is anatomically normal.  LM lesion, 85% stenosed.  LIMA was injected is normal in caliber, and is anatomically normal.  Ost 4th Mrg lesion,  90% stenosed.  Prox LAD-2 lesion, 100% stenosed.  Prox LAD-1 lesion, 80% stenosed.  Ost Cx lesion, 70% stenosed.  Ost RCA lesion, 70% stenosed.  ECG has two new inverted T-waves compared to last ECG from 8 months ago - uncertain significance at this time.  Patient is currently chest pain-free.  I will give him a full dose aspirin, check labs, and anticipate touching base  with Dr. Rockey Situ about any medication interventions that he would recommend at this time.  Doubt NSTEMI; UA is technically likely given known severe disease and new symptoms, but will discuss with patient's cardiologist.   Clinical Course  Comment By Time  The patient's potassium is low and I have given him 40 mEq by mouth and wrote a prescription for 20 mEq daily 7 days.  His magnesium is normal.  He continues to be asymptomatic in the emergency department.  I spoke by phone with Dr. Rockey Situ and we reviewed the patient's medical history and his history of present illness.  Dr. Rockey Situ is going to ask his office staff to call the patient later today to schedule the next available office appointment and probably a stress test.  I discussed this with the patient and his son and they both are comfortable with this follow-up plan. Hinda Kehr, MD 09/06 1223   Patient takes 162 mg ASA daily - I gave another two baby aspirin to meet recommendation of a full dose ASA today. ____________________________________________  FINAL CLINICAL IMPRESSION(S) / ED DIAGNOSES  Final diagnoses:  Nonspecific chest pain  Hypokalemia     MEDICATIONS GIVEN DURING THIS VISIT:  Medications  potassium chloride SA (K-DUR,KLOR-CON) CR tablet 40 mEq (40 mEq Oral Given 04/10/16 1219)  aspirin chewable tablet 162 mg (162 mg Oral Given 04/10/16 1219)     NEW OUTPATIENT MEDICATIONS STARTED DURING THIS VISIT:  New Prescriptions   POTASSIUM CHLORIDE SA (KLOR-CON M20) 20 MEQ TABLET    Take 1 tablet (20 mEq total) by mouth daily.    Modified  Medications   No medications on file    Discontinued Medications   No medications on file     Note:  This document was prepared using Dragon voice recognition software and may include unintentional dictation errors.   Hinda Kehr, MD 04/10/16 1226

## 2016-04-10 NOTE — Discharge Instructions (Signed)

## 2016-04-10 NOTE — Telephone Encounter (Signed)
Pt states the last several days he has been experiencing, light, rotating chest pain on and off  Pt c/o of Chest Pain: STAT if CP now or developed within 24 hours  1. Are you having CP right now? It goes and comes   2. Are you experiencing any other symptoms (ex. SOB, nausea, vomiting, sweating)? no  3. How long have you been experiencing CP? Couple days  4. Is your CP continuous or coming and going? Comes and goes  5. Have you taken Nitroglycerin? no ?

## 2016-04-10 NOTE — ED Notes (Signed)
Pt alert and oriented X4, active, cooperative, pt in NAD. RR even and unlabored, color WNL.  Pt informed to return if any life threatening symptoms occur.   

## 2016-04-10 NOTE — ED Triage Notes (Signed)
Pt c/o intermittent left sided chest pain for the past couple of day .denies SOB/N/V/Diaphoresis.. Pt has a hx of open heart surgery.[name]

## 2016-04-10 NOTE — Telephone Encounter (Signed)
Spoke w/ pt.  He reports that he has been having "rotating chest pain" for the past few days. Reports that he has an rx for Imdur, but this no longer seems to be helping.  He is concerned, as he was previously told that he had a "small blockage" and he has not been seen since January. Asked him to be more descriptive, he only states that the pain "comes and goes in light situations". While we are talking, pt reports that his chest pain has returned, he can feel it starting to hurt and tighten. Advised pt to call 911 or have someone take him to the ED.  He is agreeable and will proceed there now.

## 2016-04-14 NOTE — Progress Notes (Signed)
[place]  [phone] [phone]  ID: Frank Mcdaniel OB: 01-21-1933  MR#: [id number]  QJF#:[id number]  Patient Care Team: [name], MD as PCP - General (Family Medicine) [name], MD as Consulting Physician (Cardiology) [name], MD as Consulting Physician (Oncology)  CHIEF COMPLAINT: Hairy cell leukemia in complete remission.  INTERVAL HISTORY: Patient returns to clinic today for repeat laboratory work and further evaluation. Patient states he felt back to his usual state of health until approximately 3 weeks ago when he became increasingly weak and fatigued. He otherwise feels well.  He has no neurologic complaints. He denies any fevers. He continues to have occasional stomach discomfort which is relieved with Tums. He denies nausea, vomiting, diarrhea or constipation. He denies any chest pain or shortness of breath. He denies any weight loss. He has no urinary complaints. Patient offers no further specific complaints today.   REVIEW OF SYSTEMS:   Review of Systems  Constitutional: Positive for malaise/fatigue. Negative for fever.  Respiratory: Negative.  Negative for cough and shortness of breath.   Cardiovascular: Negative for chest pain.  Gastrointestinal: Negative for nausea and vomiting.  Genitourinary: Negative.   Musculoskeletal: Positive for back pain.  Neurological: Negative.   Psychiatric/Behavioral: Negative.     As per HPI. Otherwise, a complete review of systems is negative.  PAST MEDICAL HISTORY: Past Medical History:  Diagnosis Date  . Anemia 09/01/2015  . Anxiety 09/11/2015  . Asthma   . Asymptomatic Sinus Bradycardia   . Atypical chest pain    a. 08/2014  . BPH (benign prostatic hyperplasia)   . CKD (chronic kidney disease), stage III   . COPD (chronic obstructive pulmonary disease) ([place])   . Coronary artery disease    a. 2000 s/p CABG;  b. 10/2008 Neg MV, EF 64%; c. cath 05/2015: LM  85% sev cal, pLAD-1 lesion 80%, pLAD-2 lesion 100% chronic, ostLCx 70% sev cal, OM4 90%, ostRCA 70%, mid RCA 90%, LIMA-LAD patent, VG-OM2 patent, VG-OM3 patent, VG-RPDA patent. No AS  . GERD (gastroesophageal reflux disease)   . H/O echocardiogram    a. 10/2005 Echo: nl EF.  [name] Hairy cell leukemia ([place])   . HTN (hypertension)   . Hyperlipidemia   . Hypertension   . Leukemia ([place])   . Shortness of breath dyspnea     PAST SURGICAL HISTORY: Past Surgical History:  Procedure Laterality Date  . CARDIAC CATHETERIZATION N/A 05/26/2015   Procedure: Left Heart Cath;  Surgeon: [name], MD;  Location: [facility];  Service: Cardiovascular;  Laterality: N/A;  . CARDIAC CATHETERIZATION N/A 05/26/2015   Procedure: Coronary/Graft Angiography;  Surgeon: [name], MD;  Location: [facility];  Service: Cardiovascular;  Laterality: N/A;  . CATARACT EXTRACTION    . CORONARY ARTERY BYPASS GRAFT  2000  . TONSILLECTOMY  1942   . UPPER GASTROINTESTINAL ENDOSCOPY  3 years ago    with Dilation.     FAMILY HISTORY Family History  Problem Relation Age of Onset  . Hypertension Mother   . Arthritis Father   . Other      no premature CAD.  [name] Colon cancer Neg Hx   . Prostate cancer Neg Hx   . Stomach cancer Neg Hx        ADVANCED DIRECTIVES:    HEALTH MAINTENANCE: Social History  Substance Use Topics  . Smoking status: Never Smoker  . Smokeless tobacco: Never Used  . Alcohol use No  No Known Allergies  Current Outpatient Prescriptions  Medication Sig Dispense Refill  . aspirin 81 MG EC tablet Take 2 tablets (162 mg total) by mouth daily. 30 tablet 0  . atorvastatin (LIPITOR) 40 MG tablet Take 0.5 tablets (20 mg total) by mouth daily. 45 tablet 3  . Calcium Carbonate Antacid (TUMS CHEWY BITES PO) Take by mouth.    . isosorbide mononitrate (IMDUR) 30 MG 24 hr tablet Take 1 tablet (30 mg total) by mouth daily. 90 tablet 3  . mirabegron ER (MYRBETRIQ) 50 MG  TB24 tablet Take 1 tablet (50 mg total) by mouth daily. 30 tablet 11  . Multiple Vitamin (MULTIVITAMIN) tablet Take 1 tablet by mouth daily.      . potassium chloride SA (KLOR-CON M20) 20 MEQ tablet Take 1 tablet (20 mEq total) by mouth daily. 7 tablet 0  . prochlorperazine (COMPAZINE) 10 MG tablet TAKE 1 TABLET (10 MG TOTAL) BY MOUTH EVERY 6 (SIX) HOURS AS NEEDED FOR NAUSEA OR VOMITING. 30 tablet 0  . ranitidine (ZANTAC) 150 MG tablet Take 150 mg by mouth 2 (two) times daily. Reported on 12/25/2015    . tamsulosin (FLOMAX) 0.4 MG CAPS capsule TAKE 1 CAPSULE (0.4 MG TOTAL) BY MOUTH DAILY. 30 capsule 3   Current Facility-Administered Medications  Medication Dose Route Frequency Provider Last Rate Last Dose  . 0.9 %  sodium chloride infusion  500 mL Intravenous Continuous Milus Banister, MD       Facility-Administered Medications Ordered in Other Visits  Medication Dose Route Frequency Provider Last Rate Last Dose  . sodium chloride 0.9 % injection 10 mL  10 mL Intracatheter PRN [name], MD      . sodium chloride 0.9 % injection 10 mL  10 mL Intracatheter PRN [name], MD        OBJECTIVE: Vitals:   04/15/16 1056  BP: (!) 147/69  Pulse: 66  Resp: 18  Temp: 98.3 F (36.8 C)     Body mass index is 23.33 kg/m.    ECOG FS:1 - Symptomatic but completely ambulatory  General: Well-developed, well-nourished, no acute distress. Eyes: anicteric sclera. Lungs: Clear to auscultation bilaterally. Heart: Regular rate and rhythm. No rubs, murmurs, or gallops. Abdomen: Soft, nontender, nondistended. No organomegaly noted, normoactive bowel sounds. Musculoskeletal: No edema, cyanosis, or clubbing. Neuro: Alert, answering all questions appropriately. Cranial nerves grossly intact. Skin: No rashes or petechiae noted. Psych: Normal affect.   LAB RESULTS:  Lab Results  Component Value Date   NA 138 04/15/2016   K 3.7 04/15/2016   CL 106 04/15/2016   CO2 27 04/15/2016    GLUCOSE 117 (H) 04/15/2016   BUN 16 04/15/2016   CREATININE 1.26 (H) 04/15/2016   CALCIUM 9.6 04/15/2016   PROT 6.9 04/15/2016   ALBUMIN 4.0 04/15/2016   AST 23 04/15/2016   ALT 13 (L) 04/15/2016   ALKPHOS 80 04/15/2016   BILITOT 0.4 04/15/2016   GFRNONAA 52 (L) 04/15/2016   GFRAA 60 (L) 04/15/2016    Lab Results  Component Value Date   WBC 6.8 04/15/2016   NEUTROABS 5.3 04/15/2016   HGB 12.1 (L) 04/15/2016   HCT 34.9 (L) 04/15/2016   MCV 91.6 04/15/2016   PLT 183 04/15/2016     STUDIES: Dg Chest 2 View  Result Date: 04/10/2016 CLINICAL DATA:  Left chest pain.  COPD . EXAM: CHEST  2 VIEW COMPARISON:  05/09/2015. FINDINGS: Prior CABG. Heart size normal. Normal pulmonary vascularity. Low lung volumes with mild  bibasilar atelectasis. No pleural effusion or pneumothorax. Degenerative changes thoracic spine. IMPRESSION: 1.  Prior CABG.  Heart size normal.  No pulmonary venous congestion. 2. Low lung volumes with mild bibasilar atelectasis . Electronically Signed   By: Marcello Moores  Register   On: 04/10/2016 11:47    ASSESSMENT:  Hairy cell leukemia in complete remission.  PLAN:   1. Hairy cell leukemia in complete remission: Bone marrow biopsy in May 2017 revealed no evidence of disease. Patient initially received cladribine in 2006 and then was treated for a relapse in 2011 and again in 2014. Bone marrow biopsy at that time confirmed recurrence of disease with 80-90% hairy cell leukemia noted in his specimen. Patient completed cladribine 0.15 mg/kg on days 1 through 5 along with 8 weekly cycles of Rituxan on April 18, 2015. Bone marrow biopsy results after completion of 8 weeks of Rituxan without evidence of residual disease. No intervention is needed at this time. Return to clinic in 3 months with repeat laboratory work and further evaluation. 2. Thrombocytopenia: Resolved.  Patient had normal trilineage hematopoiesis in his bone marrow.  3. Weakness and fatigue: Patient reports that  he recently started new medication with urology as well as significant nocturia. He follows up with his urologist later this week.  Patient expressed understanding and was in agreement with this plan. He also understands that He can call clinic at any time with any questions, concerns, or complaints.    [name], MD   04/15/2016 4:51 PM  ROS

## 2016-04-15 ENCOUNTER — Inpatient Hospital Stay: Payer: Medicare Other | Attending: Oncology | Admitting: Oncology

## 2016-04-15 ENCOUNTER — Inpatient Hospital Stay: Payer: Medicare Other

## 2016-04-15 VITALS — BP 147/69 | HR 66 | Temp 98.3°F | Resp 18 | Wt 153.4 lb

## 2016-04-15 DIAGNOSIS — I2510 Atherosclerotic heart disease of native coronary artery without angina pectoris: Secondary | ICD-10-CM | POA: Diagnosis not present

## 2016-04-15 DIAGNOSIS — R5383 Other fatigue: Secondary | ICD-10-CM | POA: Diagnosis not present

## 2016-04-15 DIAGNOSIS — N400 Enlarged prostate without lower urinary tract symptoms: Secondary | ICD-10-CM | POA: Diagnosis not present

## 2016-04-15 DIAGNOSIS — I129 Hypertensive chronic kidney disease with stage 1 through stage 4 chronic kidney disease, or unspecified chronic kidney disease: Secondary | ICD-10-CM | POA: Diagnosis not present

## 2016-04-15 DIAGNOSIS — N189 Chronic kidney disease, unspecified: Secondary | ICD-10-CM | POA: Diagnosis not present

## 2016-04-15 DIAGNOSIS — C9141 Hairy cell leukemia, in remission: Secondary | ICD-10-CM | POA: Diagnosis not present

## 2016-04-15 DIAGNOSIS — J449 Chronic obstructive pulmonary disease, unspecified: Secondary | ICD-10-CM | POA: Diagnosis not present

## 2016-04-15 DIAGNOSIS — Z79899 Other long term (current) drug therapy: Secondary | ICD-10-CM | POA: Diagnosis not present

## 2016-04-15 DIAGNOSIS — Z7982 Long term (current) use of aspirin: Secondary | ICD-10-CM

## 2016-04-15 DIAGNOSIS — R5381 Other malaise: Secondary | ICD-10-CM | POA: Diagnosis not present

## 2016-04-15 DIAGNOSIS — M549 Dorsalgia, unspecified: Secondary | ICD-10-CM

## 2016-04-15 DIAGNOSIS — K219 Gastro-esophageal reflux disease without esophagitis: Secondary | ICD-10-CM

## 2016-04-15 DIAGNOSIS — R531 Weakness: Secondary | ICD-10-CM | POA: Diagnosis not present

## 2016-04-15 LAB — CBC WITH DIFFERENTIAL/PLATELET
BASOS PCT: 1 %
Basophils Absolute: 0 10*3/uL (ref 0–0.1)
EOS ABS: 0.2 10*3/uL (ref 0–0.7)
Eosinophils Relative: 3 %
HCT: 34.9 % — ABNORMAL LOW (ref 40.0–52.0)
Hemoglobin: 12.1 g/dL — ABNORMAL LOW (ref 13.0–18.0)
Lymphocytes Relative: 5 %
Lymphs Abs: 0.4 10*3/uL — ABNORMAL LOW (ref 1.0–3.6)
MCH: 31.7 pg (ref 26.0–34.0)
MCHC: 34.6 g/dL (ref 32.0–36.0)
MCV: 91.6 fL (ref 80.0–100.0)
MONO ABS: 0.8 10*3/uL (ref 0.2–1.0)
MONOS PCT: 13 %
Neutro Abs: 5.3 10*3/uL (ref 1.4–6.5)
Neutrophils Relative %: 78 %
Platelets: 183 10*3/uL (ref 150–440)
RBC: 3.82 MIL/uL — ABNORMAL LOW (ref 4.40–5.90)
RDW: 13.6 % (ref 11.5–14.5)
WBC: 6.8 10*3/uL (ref 3.8–10.6)

## 2016-04-15 LAB — COMPREHENSIVE METABOLIC PANEL
ALBUMIN: 4 g/dL (ref 3.5–5.0)
ALT: 13 U/L — ABNORMAL LOW (ref 17–63)
ANION GAP: 5 (ref 5–15)
AST: 23 U/L (ref 15–41)
Alkaline Phosphatase: 80 U/L (ref 38–126)
BILIRUBIN TOTAL: 0.4 mg/dL (ref 0.3–1.2)
BUN: 16 mg/dL (ref 6–20)
CO2: 27 mmol/L (ref 22–32)
Calcium: 9.6 mg/dL (ref 8.9–10.3)
Chloride: 106 mmol/L (ref 101–111)
Creatinine, Ser: 1.26 mg/dL — ABNORMAL HIGH (ref 0.61–1.24)
GFR calc non Af Amer: 52 mL/min — ABNORMAL LOW (ref >60)
GFR, EST AFRICAN AMERICAN: 60 mL/min — AB (ref >60)
GLUCOSE: 117 mg/dL — AB (ref 65–99)
POTASSIUM: 3.7 mmol/L (ref 3.5–5.1)
Sodium: 138 mmol/L (ref 135–145)
TOTAL PROTEIN: 6.9 g/dL (ref 6.5–8.1)

## 2016-04-15 NOTE — Progress Notes (Signed)
States for the past few weeks has developed fatigue. Continues to have on-going GI upset.

## 2016-04-17 NOTE — Progress Notes (Signed)
Cardiology Office Note Date:  04/18/2016  Patient ID:  Rankin, November [birth year], MRN [id number] PCP:  [name], MD  Cardiologist:  Dr. Rockey Situ, MD    Chief Complaint: ED follow up for chest pain  History of Present Illness: Frank Mcdaniel is a 82 y.o. male with history of CAD s/p 4-vessel CABG in 02/1999 (LIMA-LAD, VG-OM2, VG-OM3, VG-RPDA), hairy cell leukemia, chronic anemia, HTN, HLD, COPD, CKD stage III, and chronic chest pain who presents for ED follow up of chest pain.    He was admitted in June of 2016 for SOB. Underwent Lexiscan Myoview that showed no ischemia, low risk study. Echo from that admission showed EF 55-60%, normal wall motion, LV diastolic function was normal, LA mildly dilated, mild TR, normal PASP. He continued to be symptomatic with SOB and chest pain. He underwent diagnostic cardiac cath in 05/2015 that showed severe 3-vessel CAD with 4 patent grafts. Medical management was advised and the etiology of his symptoms was felt to be possibly his leukemia and general fatigue. He was recently seen in the ED on 9/6 with intermittent, brief episodes of chest pain. Troponin negative x 1. Potassium was noted to be low at 3.0 s/p repletion. SCr 1.34. Magnesium 2.0. HGB 12.2. WBC 5.7. CXR with mild bibasilar atelectasis. EKG non-acute. He was discharged with outpatient follow up. He was seen by oncology on 9/11 for routine follow up. He noted general fatigue that had been present since starting a new medication by Urology. Labs were checked at that time and showed a K+ of 3.7.    He comes in today stating he had a brief episode of intermittent chest pain on 9/6 that lasted a couple of hours which led him to go to the ED. He has not had any further chest pain since then. He down plays the episode of chest pain numerous times throughout our office visit. The more pressing issue for him and his son in a significant increase in fatigue and malaise over the past 1  month. During that time span he was recently started on Myrbetriq by Urology for nocturia. He reports this medication did not help much and he has been off it for the past 1 week. While he was on this medication he denied any sensation of palpitations or irregular heart beat. BP remained well controlled. He gets up to void at least 6 to 7 times nightly and is only able to sleep for about an hour at a time because of the nocturia.    Past Medical History:  Diagnosis Date  . Anemia 09/01/2015  . Anxiety 09/11/2015  . Asthma   . Asymptomatic Sinus Bradycardia   . Atypical chest pain    a. 08/2014  . BPH (benign prostatic hyperplasia)   . CKD (chronic kidney disease), stage III   . COPD (chronic obstructive pulmonary disease) ([place])   . Coronary artery disease    a. 2000 s/p CABG;  b. 10/2008 Neg MV, EF 64%; c. cath 05/2015: LM 85% sev cal, pLAD-1 lesion 80%, pLAD-2 lesion 100% chronic, ostLCx 70% sev cal, OM4 90%, ostRCA 70%, mid RCA 90%, LIMA-LAD patent, VG-OM2 patent, VG-OM3 patent, VG-RPDA patent. No AS  . GERD (gastroesophageal reflux disease)   . H/O echocardiogram    a. 10/2005 Echo: nl EF.  [name] Hairy cell leukemia ([place])   . HTN (hypertension)   . Hyperlipidemia   . Hypertension   . Leukemia ([place])   . Shortness of breath dyspnea  Past Surgical History:  Procedure Laterality Date  . CARDIAC CATHETERIZATION N/A 05/26/2015   Procedure: Left Heart Cath;  Surgeon: [name], MD;  Location: [facility];  Service: Cardiovascular;  Laterality: N/A;  . CARDIAC CATHETERIZATION N/A 05/26/2015   Procedure: Coronary/Graft Angiography;  Surgeon: [name], MD;  Location: [facility];  Service: Cardiovascular;  Laterality: N/A;  . CATARACT EXTRACTION    . CORONARY ARTERY BYPASS GRAFT  2000  . TONSILLECTOMY  1942   . UPPER GASTROINTESTINAL ENDOSCOPY  3 years ago    with Dilation.     Current Outpatient Prescriptions  Medication Sig Dispense Refill  . aspirin 81  MG EC tablet Take 2 tablets (162 mg total) by mouth daily. 30 tablet 0  . atorvastatin (LIPITOR) 40 MG tablet Take 0.5 tablets (20 mg total) by mouth daily. 45 tablet 3  . Calcium Carbonate Antacid (TUMS CHEWY BITES PO) Take by mouth.    . isosorbide mononitrate (IMDUR) 60 MG 24 hr tablet Take 1 tablet (60 mg total) by mouth daily. 30 tablet 5  . mirabegron ER (MYRBETRIQ) 50 MG TB24 tablet Take 1 tablet (50 mg total) by mouth daily. 30 tablet 11  . Multiple Vitamin (MULTIVITAMIN) tablet Take 1 tablet by mouth daily.      . potassium chloride SA (KLOR-CON M20) 20 MEQ tablet Take 1 tablet (20 mEq total) by mouth daily. 7 tablet 0  . prochlorperazine (COMPAZINE) 10 MG tablet TAKE 1 TABLET (10 MG TOTAL) BY MOUTH EVERY 6 (SIX) HOURS AS NEEDED FOR NAUSEA OR VOMITING. 30 tablet 0  . ranitidine (ZANTAC) 150 MG tablet Take 150 mg by mouth 2 (two) times daily. Reported on 12/25/2015    . tamsulosin (FLOMAX) 0.4 MG CAPS capsule TAKE 1 CAPSULE (0.4 MG TOTAL) BY MOUTH DAILY. 30 capsule 3  . ranolazine (RANEXA) 500 MG 12 hr tablet Take 1 tablet (500 mg total) by mouth 2 (two) times daily. 60 tablet 5   Current Facility-Administered Medications  Medication Dose Route Frequency Provider Last Rate Last Dose  . 0.9 %  sodium chloride infusion  500 mL Intravenous Continuous Milus Banister, MD       Facility-Administered Medications Ordered in Other Visits  Medication Dose Route Frequency Provider Last Rate Last Dose  . sodium chloride 0.9 % injection 10 mL  10 mL Intracatheter PRN [name], MD      . sodium chloride 0.9 % injection 10 mL  10 mL Intracatheter PRN [name], MD        Allergies:   Review of patient's allergies indicates no known allergies.   Social History:  The patient  reports that he has never smoked. He has never used smokeless tobacco. He reports that he does not drink alcohol or use drugs.   Family History:  The patient's family history includes Arthritis in his father;  Hypertension in his mother.  ROS:   Review of Systems  Constitutional: Positive for malaise/fatigue. Negative for chills, diaphoresis, fever and weight loss.  HENT: Negative for congestion.   Eyes: Negative for discharge and redness.  Respiratory: Negative for cough, sputum production, shortness of breath and wheezing.   Cardiovascular: Positive for chest pain. Negative for palpitations, orthopnea, claudication, leg swelling and PND.  Gastrointestinal: Negative for abdominal pain, heartburn, melena, nausea and vomiting.  Genitourinary: Positive for frequency.  Musculoskeletal: Negative for falls and myalgias.  Skin: Negative for rash.  Neurological: Positive for weakness. Negative for dizziness, tingling, tremors,  sensory change, speech change, focal weakness and loss of consciousness.  Endo/Heme/Allergies: Does not bruise/bleed easily.  Psychiatric/Behavioral: Negative for substance abuse. The patient is nervous/anxious.      PHYSICAL EXAM:  VS:  BP 118/70   Pulse 67   Ht 5\' 8"  (1.727 m)   Wt 153 lb (69.4 kg)   BMI 23.26 kg/m  BMI: Body mass index is 23.26 kg/m.  Physical Exam  Constitutional: He is oriented to person, place, and time. He appears well-developed and well-nourished.  HENT:  Head: Normocephalic and atraumatic.  Eyes: Right eye exhibits no discharge. Left eye exhibits no discharge.  Neck: Normal range of motion. No JVD present.  Cardiovascular: Normal rate, regular rhythm, S1 normal, S2 normal and normal heart sounds.  Exam reveals no distant heart sounds, no friction rub, no midsystolic click and no opening snap.   No murmur heard. Pulmonary/Chest: Effort normal and breath sounds normal. No respiratory distress. He has no decreased breath sounds. He has no wheezes. He has no rales. He exhibits no tenderness.  Abdominal: Soft. He exhibits no distension. There is no tenderness.  Musculoskeletal: He exhibits no edema.  Neurological: He is alert and oriented to  person, place, and time.  Skin: Skin is warm and dry. No cyanosis. Nails show no clubbing.  Psychiatric: He has a normal mood and affect. His speech is normal and behavior is normal. Judgment and thought content normal.     EKG:  Was ordered and interpreted by me today. Shows NSR, 67 bpm, RBBB  Recent Labs: 12/21/2015: TSH 0.74 04/10/2016: Magnesium 2.0 04/15/2016: ALT 13; BUN 16; Creatinine, Ser 1.26; Hemoglobin 12.1; Platelets 183; Potassium 3.7; Sodium 138  No results found for requested labs within last 8760 hours.   Estimated Creatinine Clearance: 44.5 mL/min (by C-G formula based on SCr of 1.26 mg/dL (H)).   Wt Readings from Last 3 Encounters:  04/18/16 153 lb (69.4 kg)  04/15/16 153 lb 7 oz (69.6 kg)  04/10/16 158 lb (71.7 kg)     Other studies reviewed: Additional studies/records reviewed today include: summarized above  ASSESSMENT AND PLAN:  1. CAD as above: No further chest pain. He reports this episode of chest pain was mild and intermittent. He down plays this episode multiple times during his office visit. Will increase Imdur to 60 mg daily as well as add Ranexa 500 mg bid. Continue remaining medications. Check echo as below. He declines stress testing at this time.   2. Fatigue and malaise: Likely multifactorial including frequent nocturia having to void 6-7 times nightly as well as hairy cell leukemia with anemia. Cannot rule out cardiac component at this time however. Will check an echo to evaluate LV systolic function and right-sided pressure. If he has a decline in LV systolic function would need to pursue ischemic evaluation. For now, he does not want to proceed with nuclear stress testing. He has an appointment with urology on 04/19/16 to discuss his nocturia.    3. HTN: Well controlled today. Continue current medications.   4. HLD: Lipitor 40 mg daily. Recommend fasting lipid and liver function.   5. CKD stage III: Stable by recent labs on 04/15/16.   6. Anxiety:  Likely playing a role in the above as well. Defer to PCP.   7. Hairy cell leukemia: Followed by oncology.   8. Hypokalemia: Check bmet. Replete as needed.   Disposition: F/u with Dr. Rockey Situ, MD in 1 month.   Current medicines are reviewed at length with the patient  today.  The patient did not have any concerns regarding medicines.  Melvern Banker PA-C 04/18/2016 4:36 PM     [address], [place] [address] [phone]

## 2016-04-18 ENCOUNTER — Encounter: Payer: Self-pay | Admitting: Physician Assistant

## 2016-04-18 ENCOUNTER — Ambulatory Visit (INDEPENDENT_AMBULATORY_CARE_PROVIDER_SITE_OTHER): Payer: Medicare Other | Admitting: Physician Assistant

## 2016-04-18 VITALS — BP 118/70 | HR 67 | Ht 68.0 in | Wt 153.0 lb

## 2016-04-18 DIAGNOSIS — N183 Chronic kidney disease, stage 3 unspecified: Secondary | ICD-10-CM

## 2016-04-18 DIAGNOSIS — R0602 Shortness of breath: Secondary | ICD-10-CM

## 2016-04-18 DIAGNOSIS — E785 Hyperlipidemia, unspecified: Secondary | ICD-10-CM | POA: Diagnosis not present

## 2016-04-18 DIAGNOSIS — C9140 Hairy cell leukemia not having achieved remission: Secondary | ICD-10-CM

## 2016-04-18 DIAGNOSIS — E876 Hypokalemia: Secondary | ICD-10-CM

## 2016-04-18 DIAGNOSIS — I2510 Atherosclerotic heart disease of native coronary artery without angina pectoris: Secondary | ICD-10-CM | POA: Diagnosis not present

## 2016-04-18 DIAGNOSIS — F419 Anxiety disorder, unspecified: Secondary | ICD-10-CM

## 2016-04-18 DIAGNOSIS — I10 Essential (primary) hypertension: Secondary | ICD-10-CM | POA: Diagnosis not present

## 2016-04-18 MED ORDER — ISOSORBIDE MONONITRATE ER 60 MG PO TB24: 60.0000 mg | ORAL_TABLET | Freq: Every day | ORAL | 5 refills | Status: DC

## 2016-04-18 MED ORDER — RANOLAZINE ER 500 MG PO TB12: 500.0000 mg | ORAL_TABLET | Freq: Two times a day (BID) | ORAL | 5 refills | Status: DC

## 2016-04-18 NOTE — Patient Instructions (Addendum)
Medication Instructions:  Your physician has recommended you make the following change in your medication:  1) INCREASE Imdur to 60mg  daily 2) START Ranexa 500mg  Twice daily. An Rx has been sent you pharmacy  Labwork: Bmet Today  Testing/Procedures: Your physician has requested that you have an echocardiogram. Echocardiography is a painless test that uses sound waves to create images of your heart. It provides your doctor with information about the size and shape of your heart and how well your heart's chambers and valves are working. This procedure takes approximately one hour. There are no restrictions for this procedure.   Follow-Up: Your physician recommends that you schedule a follow-up appointment in: 4 weeks with Dr.Gollan    Any Other Special Instructions Will Be Listed Below (If Applicable).     If you need a refill on your cardiac medications before your next appointment, please call your pharmacy.

## 2016-04-19 ENCOUNTER — Ambulatory Visit (INDEPENDENT_AMBULATORY_CARE_PROVIDER_SITE_OTHER): Payer: Medicare Other | Admitting: Urology

## 2016-04-19 ENCOUNTER — Encounter: Payer: Self-pay | Admitting: Urology

## 2016-04-19 VITALS — BP 109/66 | HR 69 | Ht 68.0 in | Wt 151.6 lb

## 2016-04-19 DIAGNOSIS — R351 Nocturia: Secondary | ICD-10-CM | POA: Diagnosis not present

## 2016-04-19 DIAGNOSIS — N390 Urinary tract infection, site not specified: Secondary | ICD-10-CM

## 2016-04-19 LAB — BASIC METABOLIC PANEL
BUN / CREAT RATIO: 12 (ref 10–24)
BUN: 16 mg/dL (ref 8–27)
CALCIUM: 9.9 mg/dL (ref 8.6–10.2)
CHLORIDE: 101 mmol/L (ref 96–106)
CO2: 27 mmol/L (ref 18–29)
Creatinine, Ser: 1.29 mg/dL — ABNORMAL HIGH (ref 0.76–1.27)
GFR calc non Af Amer: 52 mL/min/{1.73_m2} — ABNORMAL LOW (ref >59)
GFR, EST AFRICAN AMERICAN: 60 mL/min/{1.73_m2} (ref >59)
Glucose: 87 mg/dL (ref 65–99)
POTASSIUM: 4.5 mmol/L (ref 3.5–5.2)
SODIUM: 143 mmol/L (ref 134–144)

## 2016-04-19 LAB — URINALYSIS, COMPLETE
Bilirubin, UA: NEGATIVE
GLUCOSE, UA: NEGATIVE
Ketones, UA: NEGATIVE
LEUKOCYTES UA: NEGATIVE
Nitrite, UA: NEGATIVE
PROTEIN UA: NEGATIVE
RBC, UA: NEGATIVE
Specific Gravity, UA: >1.03 — ABNORMAL HIGH (ref 1.005–1.030)
UUROB: 0.2 mg/dL (ref 0.2–1.0)
pH, UA: 5 (ref 5.0–7.5)

## 2016-04-19 LAB — MICROSCOPIC EXAMINATION
BACTERIA UA: NONE SEEN
Epithelial Cells (non renal): NONE SEEN /hpf (ref 0–10)
RBC, UA: NONE SEEN /hpf (ref 0–?)

## 2016-04-19 MED ORDER — SOLIFENACIN SUCCINATE 5 MG PO TABS: 5.0000 mg | ORAL_TABLET | Freq: Every day | ORAL | 11 refills | Status: DC

## 2016-04-19 MED ORDER — FESOTERODINE FUMARATE ER 4 MG PO TB24: 4.0000 mg | ORAL_TABLET | Freq: Every day | ORAL | 11 refills | Status: DC

## 2016-04-19 NOTE — Progress Notes (Signed)
04/19/2016 11:57 AM   [place] 03-02-1933 [id number]  Referring provider: [name], MD [address] [place], [place] [address]  Chief Complaint  Patient presents with  . Nocturia    HPI: Last visit: For many months the patient has mildly worsening urgency and frequency. He has urge incontinence not every day and does not wear a pad. This actually has improved some. He voids every 1-2 hours and hourly at night. He has a cardiac history. His flow is reasonable  He passed a kidney stone many years ago. He's not had bladder or prostate surgery. He is on Flomax  50-60 g benign prostate  The patient was bothered by his intermittent urge incontinence and frequency. I added the beta 3 agonist to his Flomax. The possibility of stopping the Flomax in the future was discussed. He had been on it for years and it was difficult to say how much it was helping  Today:  Clinically the patient has no infection symptoms. He says he get up 10 times at night and that the mirror better did not help. Importantly the last time Frank Mcdaniel was here the urine culture was positive. We treated it even though he was not having symptoms. We will send it today. If it's positive it means the urine is colonized and we will not re-treated    PMH: Past Medical History:  Diagnosis Date  . Anemia 09/01/2015  . Anxiety 09/11/2015  . Asthma   . Asymptomatic Sinus Bradycardia   . Atypical chest pain    a. 08/2014  . BPH (benign prostatic hyperplasia)   . CKD (chronic kidney disease), stage III   . COPD (chronic obstructive pulmonary disease) ([place])   . Coronary artery disease    a. 2000 s/p CABG;  b. 10/2008 Neg MV, EF 64%; c. cath 05/2015: LM 85% sev cal, pLAD-1 lesion 80%, pLAD-2 lesion 100% chronic, ostLCx 70% sev cal, OM4 90%, ostRCA 70%, mid RCA 90%, LIMA-LAD patent, VG-OM2 patent, VG-OM3 patent, VG-RPDA patent. No AS  . GERD (gastroesophageal reflux disease)   . H/O  echocardiogram    a. 10/2005 Echo: nl EF.  [name] Hairy cell leukemia ([place])   . HTN (hypertension)   . Hyperlipidemia   . Hypertension   . Leukemia ([place])   . Shortness of breath dyspnea     Surgical History: Past Surgical History:  Procedure Laterality Date  . CARDIAC CATHETERIZATION N/A 05/26/2015   Procedure: Left Heart Cath;  Surgeon: [name], MD;  Location: [facility];  Service: Cardiovascular;  Laterality: N/A;  . CARDIAC CATHETERIZATION N/A 05/26/2015   Procedure: Coronary/Graft Angiography;  Surgeon: [name], MD;  Location: [facility];  Service: Cardiovascular;  Laterality: N/A;  . CATARACT EXTRACTION    . CORONARY ARTERY BYPASS GRAFT  2000  . TONSILLECTOMY  1942   . UPPER GASTROINTESTINAL ENDOSCOPY  3 years ago    with Dilation.     Home Medications:    Medication List       Accurate as of 04/19/16 11:57 AM. Always use your most recent med list.          aspirin 81 MG EC tablet Take 2 tablets (162 mg total) by mouth daily.   atorvastatin 40 MG tablet Commonly known as:  LIPITOR Take 0.5 tablets (20 mg total) by mouth daily.   isosorbide mononitrate 60 MG 24 hr tablet Commonly known as:  IMDUR Take 1 tablet (60 mg total)  by mouth daily.   mirabegron ER 50 MG Tb24 tablet Commonly known as:  MYRBETRIQ Take 1 tablet (50 mg total) by mouth daily.   multivitamin tablet Take 1 tablet by mouth daily.   potassium chloride SA 20 MEQ tablet Commonly known as:  KLOR-CON M20 Take 1 tablet (20 mEq total) by mouth daily.   prochlorperazine 10 MG tablet Commonly known as:  COMPAZINE TAKE 1 TABLET (10 MG TOTAL) BY MOUTH EVERY 6 (SIX) HOURS AS NEEDED FOR NAUSEA OR VOMITING.   ranitidine 150 MG tablet Commonly known as:  ZANTAC Take 150 mg by mouth 2 (two) times daily. Reported on 12/25/2015   ranolazine 500 MG 12 hr tablet Commonly known as:  RANEXA Take 1 tablet (500 mg total) by mouth 2 (two) times daily.   tamsulosin 0.4 MG  Caps capsule Commonly known as:  FLOMAX TAKE 1 CAPSULE (0.4 MG TOTAL) BY MOUTH DAILY.   TUMS CHEWY BITES PO Take by mouth.       Allergies: No Known Allergies  Family History: Family History  Problem Relation Age of Onset  . Hypertension Mother   . Arthritis Father   . Other      no premature CAD.  [name] Colon cancer Neg Hx   . Prostate cancer Neg Hx   . Stomach cancer Neg Hx     Social History:  reports that he has never smoked. He has never used smokeless tobacco. He reports that he does not drink alcohol or use drugs.  ROS: UROLOGY Frequent Urination?: Yes Hard to postpone urination?: Yes Burning/pain with urination?: No Get up at night to urinate?: Yes Leakage of urine?: No Urine stream starts and stops?: Yes Trouble starting stream?: No Do you have to strain to urinate?: No Blood in urine?: No Urinary tract infection?: Yes Sexually transmitted disease?: No Injury to kidneys or bladder?: No Painful intercourse?: No Weak stream?: No Erection problems?: No Penile pain?: No  Gastrointestinal Nausea?: No Vomiting?: No Indigestion/heartburn?: No Diarrhea?: No Constipation?: No  Constitutional Fever: No Night sweats?: No Weight loss?: No Fatigue?: Yes  Skin Skin rash/lesions?: No Itching?: No  Eyes Blurred vision?: No Double vision?: No  Ears/Nose/Throat Sore throat?: No Sinus problems?: No  Hematologic/Lymphatic Swollen glands?: No Easy bruising?: No  Cardiovascular Leg swelling?: No Chest pain?: No  Respiratory Cough?: No Shortness of breath?: No  Endocrine Excessive thirst?: No  Musculoskeletal Back pain?: No Joint pain?: No  Neurological Headaches?: No Dizziness?: No  Psychologic Depression?: No Anxiety?: No  Physical Exam: BP 109/66   Pulse 69   Ht 5\' 8"  (1.727 m)   Wt 151 lb 9.6 oz (68.8 kg)   BMI 23.05 kg/m    Laboratory Data: Lab Results  Component Value Date   WBC 6.8 04/15/2016   HGB 12.1 (L) 04/15/2016     HCT 34.9 (L) 04/15/2016   MCV 91.6 04/15/2016   PLT 183 04/15/2016    Lab Results  Component Value Date   CREATININE 1.29 (H) 04/18/2016    Lab Results  Component Value Date   PSA 2.21 12/21/2015    No results found for: TESTOSTERONE  No results found for: HGBA1C  Urinalysis    Component Value Date/Time   COLORURINE Yellow 09/01/2014 1142   APPEARANCEUR Clear 03/11/2016 1000   LABSPEC 1.013 09/01/2014 1142   PHURINE 5.0 09/01/2014 1142   GLUCOSEU Negative 03/11/2016 1000   GLUCOSEU Negative 09/01/2014 1142   HGBUR Negative 09/01/2014 1142   BILIRUBINUR Negative 03/11/2016 1000   BILIRUBINUR  Negative 09/01/2014 1142   KETONESUR Negative 09/01/2014 1142   PROTEINUR 1+ (A) 03/11/2016 1000   PROTEINUR Negative 09/01/2014 1142   UROBILINOGEN 0.2 12/21/2015 1132   NITRITE Negative 03/11/2016 1000   NITRITE Negative 09/01/2014 1142   LEUKOCYTESUR Negative 03/11/2016 1000   LEUKOCYTESUR Negative 09/01/2014 1142    Pertinent Imaging: none  Assessment & Plan:  The patient has significant nighttime frequency but does much better during the day. I will continue to keep him on Flomax. I gave him Vesicare 5 mg samples and prescription and Toviaz 4 mg samples and prescription reevaluate in 2 months. He likely has a nocturnal diuresis with a cardiac history as well. I will discuss percutaneous tibial nerve stimulation next visit if the medications have not helped   There are no diagnoses linked to this encounter.  Return in about 8 weeks (around 06/14/2016).  Frank Packer, MD  [facility] [address] [place], [place] [address] [phone]

## 2016-04-22 LAB — CULTURE, URINE COMPREHENSIVE

## 2016-04-23 ENCOUNTER — Ambulatory Visit (INDEPENDENT_AMBULATORY_CARE_PROVIDER_SITE_OTHER): Payer: Medicare Other

## 2016-04-23 VITALS — BP 106/68 | HR 66 | Temp 97.4°F | Resp 14 | Ht 63.5 in | Wt 153.8 lb

## 2016-04-23 DIAGNOSIS — Z0000 Encounter for general adult medical examination without abnormal findings: Secondary | ICD-10-CM | POA: Diagnosis not present

## 2016-04-23 NOTE — Patient Instructions (Addendum)
  Mr. Lucibello , Thank you for taking time to come for your Medicare Wellness Visit. I appreciate your ongoing commitment to your health goals. Please review the following plan we discussed and let me know if I can assist you in the future.   FOLLOW UP WITH DR. SONNENBERG AS NEEDED.  These are the goals we discussed: Goals    . Increase water intake          STOP DRINKING BEVERAGES 2 HOURS BEFORE BEDTIME AND DECREASE CAFFEINE INTAKE DURING THE DAY TO HELP WITH URINARY FREQUENCY AT NIGHT.        This is a list of the screening recommended for you and due dates:  Health Maintenance  Topic Date Due  . Tetanus Vaccine  09/02/1953  . Shingles Vaccine  09/02/1994  . Pneumonia vaccines (1 of 2 - PCV13) 09/03/1999  . Flu Shot  Addressed

## 2016-04-23 NOTE — Progress Notes (Signed)
Subjective:   Frank Mcdaniel is a 81 y.o. male who presents for an Initial Medicare Annual Wellness Visit.  Review of Systems  No ROS.  Medicare Wellness Visit.  Cardiac Risk Factors include: advanced age (>17men, >27 women);hypertension;male gender    Objective:    Today's Vitals   04/23/16 0839  BP: 106/68  Pulse: 66  Resp: 14  Temp: 97.4 F (36.3 C)  SpO2: 95%  Weight: 153 lb 12.8 oz (69.8 kg)  Height: 5' 3.5" (1.613 m)   Body mass index is 26.82 kg/m.  Current Medications (verified) Outpatient Encounter Prescriptions as of 04/23/2016  Medication Sig  . aspirin 81 MG EC tablet Take 2 tablets (162 mg total) by mouth daily.  [name] atorvastatin (LIPITOR) 40 MG tablet Take 0.5 tablets (20 mg total) by mouth daily.  . Calcium Carbonate Antacid (TUMS CHEWY BITES PO) Take by mouth.  . fesoterodine (TOVIAZ) 4 MG TB24 tablet Take 1 tablet (4 mg total) by mouth daily.  . isosorbide mononitrate (IMDUR) 60 MG 24 hr tablet Take 1 tablet (60 mg total) by mouth daily.  . Multiple Vitamin (MULTIVITAMIN) tablet Take 1 tablet by mouth daily.    . prochlorperazine (COMPAZINE) 10 MG tablet TAKE 1 TABLET (10 MG TOTAL) BY MOUTH EVERY 6 (SIX) HOURS AS NEEDED FOR NAUSEA OR VOMITING.  . ranitidine (ZANTAC) 150 MG tablet Take 150 mg by mouth 2 (two) times daily. Reported on 12/25/2015  . ranolazine (RANEXA) 500 MG 12 hr tablet Take 1 tablet (500 mg total) by mouth 2 (two) times daily.  . tamsulosin (FLOMAX) 0.4 MG CAPS capsule TAKE 1 CAPSULE (0.4 MG TOTAL) BY MOUTH DAILY.  [name] solifenacin (VESICARE) 5 MG tablet Take 1 tablet (5 mg total) by mouth daily. (Patient not taking: Reported on 04/23/2016)  . [DISCONTINUED] mirabegron ER (MYRBETRIQ) 50 MG TB24 tablet Take 1 tablet (50 mg total) by mouth daily. (Patient not taking: Reported on 04/19/2016)  . [DISCONTINUED] potassium chloride SA (KLOR-CON M20) 20 MEQ tablet Take 1 tablet (20 mEq total) by mouth daily.   Facility-Administered Encounter  Medications as of 04/23/2016  Medication  . 0.9 %  sodium chloride infusion  . sodium chloride 0.9 % injection 10 mL  . sodium chloride 0.9 % injection 10 mL    Allergies (verified) Review of patient's allergies indicates no known allergies.   History: Past Medical History:  Diagnosis Date  . Anemia 09/01/2015  . Anxiety 09/11/2015  . Asthma   . Asymptomatic Sinus Bradycardia   . Atypical chest pain    a. 08/2014  . BPH (benign prostatic hyperplasia)   . CKD (chronic kidney disease), stage III   . COPD (chronic obstructive pulmonary disease) ([place])   . Coronary artery disease    a. 2000 s/p CABG;  b. 10/2008 Neg MV, EF 64%; c. cath 05/2015: LM 85% sev cal, pLAD-1 lesion 80%, pLAD-2 lesion 100% chronic, ostLCx 70% sev cal, OM4 90%, ostRCA 70%, mid RCA 90%, LIMA-LAD patent, VG-OM2 patent, VG-OM3 patent, VG-RPDA patent. No AS  . GERD (gastroesophageal reflux disease)   . H/O echocardiogram    a. 10/2005 Echo: nl EF.  [name] Hairy cell leukemia ([place])   . HTN (hypertension)   . Hyperlipidemia   . Hypertension   . Leukemia ([place])   . Shortness of breath dyspnea    Past Surgical History:  Procedure Laterality Date  . CARDIAC CATHETERIZATION N/A 05/26/2015   Procedure: Left Heart Cath;  Surgeon: [name], MD;  Location: [facility];  Service: Cardiovascular;  Laterality: N/A;  . CARDIAC CATHETERIZATION N/A 05/26/2015   Procedure: Coronary/Graft Angiography;  Surgeon: [name], MD;  Location: [facility];  Service: Cardiovascular;  Laterality: N/A;  . CATARACT EXTRACTION    . CORONARY ARTERY BYPASS GRAFT  2000  . TONSILLECTOMY  1942   . UPPER GASTROINTESTINAL ENDOSCOPY  3 years ago    with Dilation.    Family History  Problem Relation Age of Onset  . Hypertension Mother   . Arthritis Father   . Other      no premature CAD.  [name] Colon cancer Neg Hx   . Prostate cancer Neg Hx   . Stomach cancer Neg Hx    Social History   Occupational History  .  retired    Social History Main Topics  . Smoking status: Never Smoker  . Smokeless tobacco: Never Used  . Alcohol use No  . Drug use: No  . Sexual activity: No   Tobacco Counseling Counseling given: No   Activities of Daily Living In your present state of health, do you have any difficulty performing the following activities: 04/23/2016 01/05/2016  Hearing? Tempie Donning  Vision? N N  Difficulty concentrating or making decisions? N N  Walking or climbing stairs? N N  Dressing or bathing? N N  Doing errands, shopping? N -  Preparing Food and eating ? Y -  Using the Toilet? N -  In the past six months, have you accidently leaked urine? N -  Do you have problems with loss of bowel control? N -  Managing your Medications? Y -  Managing your Finances? N -  Housekeeping or managing your Housekeeping? N -  Some recent data might be hidden    Immunizations and Health Maintenance Immunization History  Administered Date(s) Administered  . Influenza-Unspecified 04/05/2016   Health Maintenance Due  Topic Date Due  . TETANUS/TDAP  09/02/1953  . ZOSTAVAX  09/02/1994  . PNA vac Low Risk Adult (1 of 2 - PCV13) 09/03/1999    Patient Care Team: [name], MD as PCP - General (Family Medicine) [name], MD as Consulting Physician (Cardiology) [name], MD as Consulting Physician (Oncology)  Indicate any recent Medical Services you may have received from other than [facility] providers in the past year (date may be approximate).    Assessment:   This is a routine wellness examination for Frank Mcdaniel.  The goal of the wellness visit is to assist the patient how to close the gaps in care and create a preventative care plan for the patient.   Osteoporosis risk reviewed.  Medications reviewed; taking without issues or barriers.  Safety issues reviewed; lives with son.  Smoke and carbon monoxide detectors in the home. Firearms locked in a safe within the home. Wears  seatbelts when driving or riding with others. No violence in the home.  No identified risk were noted; The patient was oriented x 3; appropriate in dress and manner and no objective failures at ADL's or IADL's.   Body mass index; discussed the importance of a healthy diet, water intake and exercise. Educational material provided.  Influenza vaccine received at local pharmacy.  Historical maintenance updated.  PNA vaccine postponed for follow up with PCP, per patient request.  Patient Concerns: Fatigue; patient says he drinks 6-8 sodas/tea during the day and very little water. He is tired and urinates frequently through the night which interferes with his sleep.  Encouraged patient to  watch/decrease diuretic beverages throughout the day, increase water intake and stop drinking fluids 2 hours before bedtime.  Hearing/Vision screen Hearing Screening Comments: Wears bilateral hearing aids Vision Screening Comments: Followed by [facility] Annual visits Bilateral cataracts extracted Wears glasses  Dietary issues and exercise activities discussed: Current Exercise Habits: The patient does not participate in regular exercise at present (Active around the home)  Goals    . Increase water intake          STOP DRINKING BEVERAGES 2 HOURS BEFORE BEDTIME AND DECREASE CAFFEINE INTAKE DURING THE DAY TO HELP WITH URINARY FREQUENCY AT NIGHT.       Depression Screen PHQ 2/9 Scores 04/23/2016 01/16/2016  PHQ - 2 Score 0 0    Fall Risk Fall Risk  04/23/2016  Falls in the past year? No    Cognitive Function: MMSE - Mini Mental State Exam 04/23/2016  Orientation to time 5  Orientation to Place 5  Registration 3  Attention/ Calculation 5  Recall 3  Language- name 2 objects 2  Language- repeat 1  Language- follow 3 step command 3  Language- read & follow direction 1  Write a sentence 1  Copy design 1  Total score 30    Screening Tests Health Maintenance  Topic Date Due  .  TETANUS/TDAP  09/02/1953  . ZOSTAVAX  09/02/1994  . PNA vac Low Risk Adult (1 of 2 - PCV13) 09/03/1999  . INFLUENZA VACCINE  Addressed        Plan:   End of life planning; Advance aging; Advanced directives discussed. Copy of current HCPOA/Living Will requested.  Medicare Attestation I have personally reviewed: The patient's medical and social history Their use of alcohol, tobacco or illicit drugs Their current medications and supplements The patient's functional ability including ADLs,fall risks, home safety risks, cognitive, and hearing and visual impairment Diet and physical activities Evidence for depression   The patient's weight, height, BMI, and visual acuity have been recorded in the chart.  I have made referrals and provided education to the patient based on review of the above and I have provided the patient with a written personalized care plan for preventive services.     During the course of the visit Elijio was educated and counseled about the following appropriate screening and preventive services:   Vaccines to include Pneumoccal, Influenza, Hepatitis B, Td, Zostavax, HCV  Electrocardiogram  Colorectal cancer screening  Cardiovascular disease screening  Diabetes screening  Glaucoma screening  Nutrition counseling  Prostate cancer screening  Smoking cessation counseling  Patient Instructions (the written plan) were given to the patient.   Varney Biles, LPN   [id number]

## 2016-04-24 ENCOUNTER — Ambulatory Visit (INDEPENDENT_AMBULATORY_CARE_PROVIDER_SITE_OTHER): Payer: Medicare Other

## 2016-04-24 ENCOUNTER — Other Ambulatory Visit: Payer: Self-pay

## 2016-04-24 DIAGNOSIS — I10 Essential (primary) hypertension: Secondary | ICD-10-CM

## 2016-04-24 DIAGNOSIS — R0602 Shortness of breath: Secondary | ICD-10-CM | POA: Diagnosis not present

## 2016-04-24 LAB — ECHOCARDIOGRAM COMPLETE
AVLVOTPG: 5 mmHg
Ao-asc: 31 cm
CHL CUP DOP CALC LVOT VTI: 21.8 cm
CHL CUP MV DEC (S): 250
CHL CUP REG VEL DIAS: 117 cm/s
E/e' ratio: 10.5
EWDT: 250 ms
FS: 26 % — AB (ref 28–44)
IV/PV OW: 1.12
LA ID, A-P, ES: 35 mm
LA diam index: 2.01 cm/m2
LA vol: 50 mL
LAVOLA4C: 50 mL
LAVOLIN: 28.7 mL/m2
LDCA: 2.84 cm2
LEFT ATRIUM END SYS DIAM: 35 mm
LV E/e' medial: 10.5
LV E/e'average: 10.5
LV PW d: 10.8 mm — AB (ref 0.6–1.1)
LV TDI E'MEDIAL: 6.14
LV e' LATERAL: 7.8 cm/s
LVOT SV: 62 mL
LVOT peak vel: 116 cm/s
LVOTD: 19 mm
MV Peak grad: 3 mmHg
MV pk A vel: 87.4 m/s
MVPKEVEL: 81.9 m/s
PV Reg grad dias: 5 mmHg
Reg peak vel: 208 cm/s
TDI e' lateral: 7.8
TRMAXVEL: 208 cm/s

## 2016-05-01 NOTE — Progress Notes (Signed)
I have reviewed the above and agree.   [name], M.D.

## 2016-05-14 ENCOUNTER — Encounter: Payer: Self-pay | Admitting: Physician Assistant

## 2016-05-14 ENCOUNTER — Ambulatory Visit (INDEPENDENT_AMBULATORY_CARE_PROVIDER_SITE_OTHER): Payer: Medicare Other | Admitting: Physician Assistant

## 2016-05-14 VITALS — BP 100/56 | HR 63 | Ht 68.0 in | Wt 151.4 lb

## 2016-05-14 DIAGNOSIS — E782 Mixed hyperlipidemia: Secondary | ICD-10-CM | POA: Diagnosis not present

## 2016-05-14 DIAGNOSIS — I10 Essential (primary) hypertension: Secondary | ICD-10-CM | POA: Diagnosis not present

## 2016-05-14 DIAGNOSIS — I2510 Atherosclerotic heart disease of native coronary artery without angina pectoris: Secondary | ICD-10-CM | POA: Diagnosis not present

## 2016-05-14 DIAGNOSIS — C9140 Hairy cell leukemia not having achieved remission: Secondary | ICD-10-CM

## 2016-05-14 DIAGNOSIS — N183 Chronic kidney disease, stage 3 unspecified: Secondary | ICD-10-CM

## 2016-05-14 DIAGNOSIS — F419 Anxiety disorder, unspecified: Secondary | ICD-10-CM

## 2016-05-14 MED ORDER — ISOSORBIDE MONONITRATE ER 30 MG PO TB24: 30.0000 mg | ORAL_TABLET | Freq: Every day | ORAL | 3 refills | Status: DC

## 2016-05-14 NOTE — Progress Notes (Signed)
Cardiology Office Note Date:  05/14/2016  Patient ID:  Frank Mcdaniel, Frank Mcdaniel 11/03/1932, MRN [id number] PCP:  [name], MD  Cardiologist:  Dr. Rockey Situ, MD    Chief Complaint: Follow up echo  History of Present Illness: Frank Mcdaniel is a 82 y.o. male with history of CAD s/p 4-vessel CABG in 02/1999 (LIMA-LAD, VG-OM2, VG-OM3, VG-RPDA), hairy cell leukemia, chronic anemia, HTN, HLD, COPD, CKD stage III, and chronic chest pain who presents for follow up of recent echo.     He was admitted in June of 2016 for SOB. Underwent Lexiscan Myoview that showed no ischemia, low risk study. Echo from that admission showed EF 55-60%, normal wall motion, LV diastolic function was normal, LA mildly dilated, mild TR, normal PASP. He continued to be symptomatic with SOB and chest pain. He underwent diagnostic cardiac cath in 05/2015 that showed severe 3-vessel CAD with 4 patent grafts. Medical management was advised and the etiology of his symptoms was felt to be possibly his leukemia and general fatigue. He was recently seen in the ED on 04/10/16 with intermittent, brief episodes of chest pain. Troponin negative x 1. Potassium was noted to be low at 3.0 s/p repletion. SCr 1.34. Magnesium 2.0. HGB 12.2. WBC 5.7. CXR with mild bibasilar atelectasis. EKG non-acute. He was discharged with outpatient follow up. He was seen by oncology on 9/11 for routine follow up. He noted general fatigue that had been present since starting a new medication by Urology. Labs were checked at that time and showed a K+ of 3.7. In follow up with cardiology on 04/18/16 he had not had any further chest pain. He did note continued fatigue for greater than 1 month. He was concerned Myrbetriq, which he had started arounf the time his symtpoms began, waspossibly playing a role. He continued to have significant nocturia and was only sleeping fro about an hour at a time overnight. He underwent echo on 04/24/16 that showed normal EF at  55-60%, no RWMA, LV diastolic function was normal, mild AI, normal sized LA, PASP normal.   He is overall doing well today though does still note fatigue. He no longer thinks his fatigue is related to his heart and feels like his symptoms are multifactorial. No SOB, chest pain, palpitations, diaphoresis, nausea, vomiting, presyncope, or syncope. He is no longer taking Myrbetriq, though no changes in his symptoms. He continues to get up 6-7 times nightly to void and is not getting any sleep at overnight.    Past Medical History:  Diagnosis Date  . Anemia 09/01/2015  . Anxiety 09/11/2015  . Asthma   . Asymptomatic Sinus Bradycardia   . Atypical chest pain    a. 08/2014  . BPH (benign prostatic hyperplasia)   . CKD (chronic kidney disease), stage III   . COPD (chronic obstructive pulmonary disease) ([place])   . Coronary artery disease    a. 2000 s/p CABG;  b. 10/2008 Neg MV, EF 64%; c. cath 05/2015: LM 85% sev cal, pLAD-1 lesion 80%, pLAD-2 lesion 100% chronic, ostLCx 70% sev cal, OM4 90%, ostRCA 70%, mid RCA 90%, LIMA-LAD patent, VG-OM2 patent, VG-OM3 patent, VG-RPDA patent. No AS  . GERD (gastroesophageal reflux disease)   . H/O echocardiogram    a. 10/2005 Echo: nl EF.  [name] Hairy cell leukemia ([place])   . HTN (hypertension)   . Hyperlipidemia   . Hypertension   . Leukemia ([place])   . Shortness of breath dyspnea     Past Surgical History:  Procedure Laterality Date  . CARDIAC CATHETERIZATION N/A 05/26/2015   Procedure: Left Heart Cath;  Surgeon: [name], MD;  Location: [facility];  Service: Cardiovascular;  Laterality: N/A;  . CARDIAC CATHETERIZATION N/A 05/26/2015   Procedure: Coronary/Graft Angiography;  Surgeon: [name], MD;  Location: [facility];  Service: Cardiovascular;  Laterality: N/A;  . CATARACT EXTRACTION    . CORONARY ARTERY BYPASS GRAFT  2000  . TONSILLECTOMY  1942   . UPPER GASTROINTESTINAL ENDOSCOPY  3 years ago    with Dilation.      Current Outpatient Prescriptions  Medication Sig Dispense Refill  . aspirin 81 MG EC tablet Take 2 tablets (162 mg total) by mouth daily. 30 tablet 0  . atorvastatin (LIPITOR) 40 MG tablet Take 0.5 tablets (20 mg total) by mouth daily. 45 tablet 3  . Calcium Carbonate Antacid (TUMS CHEWY BITES PO) Take by mouth.    . fesoterodine (TOVIAZ) 4 MG TB24 tablet Take 1 tablet (4 mg total) by mouth daily. 30 tablet 11  . isosorbide mononitrate (IMDUR) 60 MG 24 hr tablet Take 1 tablet (60 mg total) by mouth daily. 30 tablet 5  . Multiple Vitamin (MULTIVITAMIN) tablet Take 1 tablet by mouth daily.      . prochlorperazine (COMPAZINE) 10 MG tablet TAKE 1 TABLET (10 MG TOTAL) BY MOUTH EVERY 6 (SIX) HOURS AS NEEDED FOR NAUSEA OR VOMITING. 30 tablet 0  . ranitidine (ZANTAC) 150 MG tablet Take 150 mg by mouth 2 (two) times daily. Reported on 12/25/2015    . ranolazine (RANEXA) 500 MG 12 hr tablet Take 1 tablet (500 mg total) by mouth 2 (two) times daily. 60 tablet 5  . solifenacin (VESICARE) 5 MG tablet Take 1 tablet (5 mg total) by mouth daily. 30 tablet 11  . tamsulosin (FLOMAX) 0.4 MG CAPS capsule TAKE 1 CAPSULE (0.4 MG TOTAL) BY MOUTH DAILY. 30 capsule 3   Current Facility-Administered Medications  Medication Dose Route Frequency Provider Last Rate Last Dose  . 0.9 %  sodium chloride infusion  500 mL Intravenous Continuous Milus Banister, MD       Facility-Administered Medications Ordered in Other Visits  Medication Dose Route Frequency Provider Last Rate Last Dose  . sodium chloride 0.9 % injection 10 mL  10 mL Intracatheter PRN [name], MD      . sodium chloride 0.9 % injection 10 mL  10 mL Intracatheter PRN [name], MD        Allergies:   Review of patient's allergies indicates no known allergies.   Social History:  The patient  reports that he has never smoked. He has never used smokeless tobacco. He reports that he does not drink alcohol or use drugs.   Family  History:  The patient's family history includes Arthritis in his father; Hypertension in his mother.  ROS:   Review of Systems  Constitutional: Positive for malaise/fatigue. Negative for chills, diaphoresis, fever and weight loss.  HENT: Negative for congestion.   Eyes: Negative for discharge and redness.  Respiratory: Negative for cough, sputum production, shortness of breath and wheezing.   Cardiovascular: Negative for chest pain, palpitations, orthopnea, claudication, leg swelling and PND.  Gastrointestinal: Negative for abdominal pain, heartburn, nausea and vomiting.  Genitourinary: Positive for frequency and urgency.  Musculoskeletal: Negative for falls and myalgias.  Skin: Negative for rash.  Neurological: Negative for dizziness, tingling, tremors, sensory change, speech change, focal weakness, loss of consciousness and weakness.  Endo/Heme/Allergies: Does not bruise/bleed easily.  Psychiatric/Behavioral: Negative for substance abuse. The patient is nervous/anxious.   All other systems reviewed and are negative.    PHYSICAL EXAM:  VS:  BP (!) 100/56   Pulse 63   Ht 5\' 8"  (1.727 m)   Wt 151 lb 6.4 oz (68.7 kg)   SpO2 97%   BMI 23.02 kg/m  BMI: Body mass index is 23.02 kg/m.  Physical Exam  Constitutional: He is oriented to person, place, and time. He appears well-developed and well-nourished.  HENT:  Head: Normocephalic and atraumatic.  Eyes: Right eye exhibits no discharge. Left eye exhibits no discharge.  Neck: Normal range of motion. No JVD present.  Cardiovascular: Normal rate, regular rhythm, S1 normal, S2 normal and normal heart sounds.  Exam reveals no distant heart sounds, no friction rub, no midsystolic click and no opening snap.   No murmur heard. Pulmonary/Chest: Effort normal and breath sounds normal. No respiratory distress. He has no decreased breath sounds. He has no wheezes. He has no rales. He exhibits no tenderness.  Abdominal: Soft. He exhibits no  distension. There is no tenderness.  Musculoskeletal: He exhibits no edema.  Neurological: He is alert and oriented to person, place, and time.  Skin: Skin is warm and dry. No cyanosis. Nails show no clubbing.  Psychiatric: He has a normal mood and affect. His speech is normal and behavior is normal. Judgment and thought content normal.     EKG:  Was not ordered today.  Recent Labs: 12/21/2015: TSH 0.74 04/10/2016: Magnesium 2.0 04/15/2016: ALT 13; Hemoglobin 12.1; Platelets 183 04/18/2016: BUN 16; Creatinine, Ser 1.29; Potassium 4.5; Sodium 143  No results found for requested labs within last 8760 hours.   CrCl cannot be calculated (Patient's most recent lab result is older than the maximum 21 days allowed.).   Wt Readings from Last 3 Encounters:  05/14/16 151 lb 6.4 oz (68.7 kg)  04/23/16 153 lb 12.8 oz (69.8 kg)  04/19/16 151 lb 9.6 oz (68.8 kg)     Other studies reviewed: Additional studies/records reviewed today include: summarized above  ASSESSMENT AND PLAN:  1. CAD as above: No symptoms concerning for chest pain. Imdur decreased as belwo to add for more permissive blood pressure. Continue remaining current medications. No plans for further ischemic evaluation at this time.   2. Malaise/fatigue: Likely multifactorial including lack of sleep overnight 2/2 significant nocturia , hairy cell leukemia,and anemia. Given normal echo and recent cardiac cath as above, less likely cardiac in etiology.   3. HTN: BP soft in the low [id number] systolic. Will decrease Imdur to 30 mg daily to allow for more permissive BP.   4. HLD: Lipitor 40 mg daily.   5. CKD stage III: Stable by recent labs.  6. Anxiety: Likely playing a role in the above as well.   7. Hairy cell leukemia: Per oncology.   Disposition: F/u with Dr. Rockey Situ, MD in 3 months.   Current medicines are reviewed at length with the patient today.  The patient did not have any concerns regarding medicines.  Melvern Banker  PA-C 05/14/2016 3:06 PM     [address] [place], [place] [address] [phone]

## 2016-05-14 NOTE — Patient Instructions (Signed)
Medication Instructions:  Please decrease your Imdur to 30 mg once daily  Labwork: None  Testing/Procedures: None  Follow-Up: With Dr. Rockey Situ in 2-3 months  If you need a refill on your cardiac medications before your next appointment, please call your pharmacy.

## 2016-05-21 ENCOUNTER — Ambulatory Visit (INDEPENDENT_AMBULATORY_CARE_PROVIDER_SITE_OTHER): Payer: Medicare Other | Admitting: Family Medicine

## 2016-05-21 ENCOUNTER — Encounter: Payer: Self-pay | Admitting: Family Medicine

## 2016-05-21 DIAGNOSIS — E7800 Pure hypercholesterolemia, unspecified: Secondary | ICD-10-CM

## 2016-05-21 DIAGNOSIS — R5382 Chronic fatigue, unspecified: Secondary | ICD-10-CM

## 2016-05-21 DIAGNOSIS — K5900 Constipation, unspecified: Secondary | ICD-10-CM

## 2016-05-21 NOTE — Progress Notes (Signed)
  [name], MD Phone: [phone]  Artemus Rip Harbour Vose is a 82 y.o. male who presents today for follow-up.  Constipation: Patient notes continued issues with this. He typically has a bowel movement every couple of days unless he takes MiraLAX. Notes this helps within a day. No nausea, vomiting, or blood in his stool. No abdominal pain. Occasionally his stools are hard little balls though at other times her normal bowel movements. Notes he takes MiraLAX every 3 days.  Fatigue: Patient notes this is about the same. He has seen cardiology and had evaluation for this. Felt to be noncontributory. Has also seen his oncologist and has checked out from their perspective. There is some concern that it may just be due to lack of sleep given his prostate issues and nocturia. Notes he was getting up 6-8 times a night though was recently started on Toviaz and was getting up 3-4 times a night. Currently trying Vesicare in place of Toviaz to see if he will be more responsive to this. Also on Flomax.  HYPERLIPIDEMIA Symptoms Chest pain on exertion:  No   Leg claudication:   No Medications: Compliance- taking Lipitor Right upper quadrant pain- no  Muscle aches- no   PMH: nonsmoker.   ROS see history of present illness  Objective  Physical Exam Vitals:   05/21/16 0841  BP: 122/64  Pulse: 66  Temp: 97.6 F (36.4 C)    BP Readings from Last 3 Encounters:  05/21/16 122/64  05/14/16 (!) 100/56  04/23/16 106/68   Wt Readings from Last 3 Encounters:  05/21/16 153 lb 6 oz (69.6 kg)  05/14/16 151 lb 6.4 oz (68.7 kg)  04/23/16 153 lb 12.8 oz (69.8 kg)    Physical Exam  Constitutional: No distress.  Cardiovascular: Normal rate, regular rhythm and normal heart sounds.   Pulmonary/Chest: Effort normal and breath sounds normal.  Abdominal: Soft. Bowel sounds are normal. He exhibits no distension. There is no tenderness.  Neurological: He is alert. Gait normal.  Skin: Skin is warm and dry.  He is not diaphoretic.     Assessment/Plan: Please see individual problem list.  Hyperlipidemia Tolerating medication. Plan for lipid panel fasting in the future.  Chronic fatigue Continues to have issues with this. Has had evaluation by cardiology and oncology which were unrevealing of cause. Could be related to just lack of sleep due to his nocturia. I encouraged him to try the Vesicare and if no improvement switch back to the [place]. He'll continue to monitor and see if improving his nocturia is beneficial.  Constipation Patient with chronic intermittent constipation. Benign abdominal exam. Encouraged daily MiraLAX use until having good bowel movements. He could also try fiber supplement.   [name], MD [place]

## 2016-05-21 NOTE — Assessment & Plan Note (Signed)
Tolerating medication. Plan for lipid panel fasting in the future.

## 2016-05-21 NOTE — Assessment & Plan Note (Signed)
Continues to have issues with this. Has had evaluation by cardiology and oncology which were unrevealing of cause. Could be related to just lack of sleep due to his nocturia. I encouraged him to try the Vesicare and if no improvement switch back to the [place]. He'll continue to monitor and see if improving his nocturia is beneficial.

## 2016-05-21 NOTE — Assessment & Plan Note (Signed)
Patient with chronic intermittent constipation. Benign abdominal exam. Encouraged daily MiraLAX use until having good bowel movements. He could also try fiber supplement.

## 2016-05-21 NOTE — Patient Instructions (Signed)
Nice to see you. You should do MiraLAX daily. Please try the Vesicare and see if this helps with her nightly urination. If it does not go back to the [place]. We will have a return for a lipid panel. If you develop abdominal pain or blood in your stool please seek medical attention medially.

## 2016-05-22 ENCOUNTER — Telehealth: Payer: Self-pay

## 2016-05-22 DIAGNOSIS — E785 Hyperlipidemia, unspecified: Secondary | ICD-10-CM

## 2016-05-22 NOTE — Telephone Encounter (Signed)
Order placed

## 2016-05-22 NOTE — Telephone Encounter (Signed)
Pt coming for labs 05/24/16. Only orders I see are from Dr. [name]. Need orders from our office. Please advise.

## 2016-05-22 NOTE — Telephone Encounter (Signed)
Pt is a Dr. Matilde Sprang pt for OAB and UTI. Pt son called stating pt woke up this morning with swelling of the groin with pain, 5/10. Per son swelling has gone down since 5:15 this morning and pain has subsided but still there. Son was unsure if this was a urological issue or PCP. Please advise.

## 2016-05-22 NOTE — Telephone Encounter (Signed)
Sounds like it could be an urological issue.  He needs an appointment.

## 2016-05-22 NOTE — Telephone Encounter (Signed)
Spoke with pt son in reference to pt needing an appt. Son voiced understanding. Son was transferred to the front to make appt.

## 2016-05-23 ENCOUNTER — Ambulatory Visit (INDEPENDENT_AMBULATORY_CARE_PROVIDER_SITE_OTHER): Payer: Medicare Other | Admitting: Urology

## 2016-05-23 ENCOUNTER — Encounter: Payer: Self-pay | Admitting: Urology

## 2016-05-23 VITALS — BP 164/77 | HR 68 | Ht 68.0 in | Wt 153.3 lb

## 2016-05-23 DIAGNOSIS — K4090 Unilateral inguinal hernia, without obstruction or gangrene, not specified as recurrent: Secondary | ICD-10-CM

## 2016-05-23 DIAGNOSIS — R351 Nocturia: Secondary | ICD-10-CM

## 2016-05-23 DIAGNOSIS — R1032 Left lower quadrant pain: Secondary | ICD-10-CM | POA: Diagnosis not present

## 2016-05-23 LAB — MICROSCOPIC EXAMINATION
Bacteria, UA: NONE SEEN
EPITHELIAL CELLS (NON RENAL): NONE SEEN /HPF (ref 0–10)

## 2016-05-23 LAB — URINALYSIS, COMPLETE
BILIRUBIN UA: NEGATIVE
GLUCOSE, UA: NEGATIVE
KETONES UA: NEGATIVE
LEUKOCYTES UA: NEGATIVE
Nitrite, UA: NEGATIVE
PROTEIN UA: NEGATIVE
RBC UA: NEGATIVE
UUROB: 0.2 mg/dL (ref 0.2–1.0)
pH, UA: 5.5 (ref 5.0–7.5)

## 2016-05-23 LAB — BLADDER SCAN AMB NON-IMAGING: SCAN RESULT: 45

## 2016-05-23 NOTE — Patient Instructions (Addendum)
Take Frank Mcdaniel with 8 ounces of water and then follow it with 2 more 8 ounces of water

## 2016-05-23 NOTE — Progress Notes (Signed)
05/23/2016 4:49 PM   [place] [birth year] [id number]  Referring provider: [name], MD [address] [place], [place] [address]  Chief Complaint  Patient presents with  . Groin Swelling    HPI: Patient is an 82 year old Caucasian male who presents today for groin swelling.  Patient states he noticed a swelling in his left groin area two days ago.  He has some discomfort with the swelling.  The swelling has reduced over the next few days.  He has not had fevers, chills, nausea or vomiting.  He suffers with consitpation and has been straining to complete bowel movements.  He is having frequency, urgency, nocturia and incontinence.  He last saw Dr. Matilde Sprang and was started on Toviaz 4 mg daily.  He has not noticed any improvement in his urianary symptoms.  He has a follow up in one month.    His IPSS score is 18/5.  His PVR was 45 mL.        IPSS    Row Name 05/23/16 1500         International Prostate Symptom Score   How often have you had the sensation of not emptying your bladder? Less than 1 in 5     How often have you had to urinate less than every two hours? About half the time     How often have you found you stopped and started again several times when you urinated? About half the time     How often have you found it difficult to postpone urination? Less than half the time     How often have you had a weak urinary stream? About half the time     How often have you had to strain to start urination? Less than 1 in 5 times     How many times did you typically get up at night to urinate? 5 Times     Total IPSS Score 18       Quality of Life due to urinary symptoms   If you were to spend the rest of your life with your urinary condition just the way it is now how would you feel about that? Unhappy        Score:  1-7 Mild 8-19 Moderate 20-35 Severe   PMH: Past Medical History:  Diagnosis Date  . Anemia 09/01/2015  . Anxiety  09/11/2015  . Asthma   . Asymptomatic Sinus Bradycardia   . Atypical chest pain    a. 08/2014  . BPH (benign prostatic hyperplasia)   . CKD (chronic kidney disease), stage III   . COPD (chronic obstructive pulmonary disease) ([place])   . Coronary artery disease    a. 2000 s/p CABG;  b. 10/2008 Neg MV, EF 64%; c. cath 05/2015: LM 85% sev cal, pLAD-1 lesion 80%, pLAD-2 lesion 100% chronic, ostLCx 70% sev cal, OM4 90%, ostRCA 70%, mid RCA 90%, LIMA-LAD patent, VG-OM2 patent, VG-OM3 patent, VG-RPDA patent. No AS  . GERD (gastroesophageal reflux disease)   . H/O echocardiogram    a. 10/2005 Echo: nl EF.  [name] Hairy cell leukemia ([place])   . HTN (hypertension)   . Hyperlipidemia   . Hypertension   . Leukemia ([place])   . Shortness of breath dyspnea     Surgical History: Past Surgical History:  Procedure Laterality Date  . CARDIAC CATHETERIZATION N/A 05/26/2015   Procedure: Left Heart Cath;  Surgeon: [name], MD;  Location: [facility];  Service: Cardiovascular;  Laterality: N/A;  . CARDIAC CATHETERIZATION N/A 05/26/2015   Procedure: Coronary/Graft Angiography;  Surgeon: [name], MD;  Location: [facility];  Service: Cardiovascular;  Laterality: N/A;  . CATARACT EXTRACTION    . CORONARY ARTERY BYPASS GRAFT  2000  . TONSILLECTOMY  1942   . UPPER GASTROINTESTINAL ENDOSCOPY  3 years ago    with Dilation.     Home Medications:    Medication List       Accurate as of 05/23/16  4:49 PM. Always use your most recent med list.          aspirin 81 MG EC tablet Take 2 tablets (162 mg total) by mouth daily.   atorvastatin 40 MG tablet Commonly known as:  LIPITOR Take 0.5 tablets (20 mg total) by mouth daily.   fesoterodine 4 MG Tb24 tablet Commonly known as:  TOVIAZ Take 1 tablet (4 mg total) by mouth daily.   isosorbide mononitrate 30 MG 24 hr tablet Commonly known as:  IMDUR Take 1 tablet (30 mg total) by mouth daily.   multivitamin tablet Take 1 tablet  by mouth daily.   prochlorperazine 10 MG tablet Commonly known as:  COMPAZINE TAKE 1 TABLET (10 MG TOTAL) BY MOUTH EVERY 6 (SIX) HOURS AS NEEDED FOR NAUSEA OR VOMITING.   ranitidine 150 MG tablet Commonly known as:  ZANTAC Take 150 mg by mouth 2 (two) times daily. Reported on 12/25/2015   ranolazine 500 MG 12 hr tablet Commonly known as:  RANEXA Take 1 tablet (500 mg total) by mouth 2 (two) times daily.   solifenacin 5 MG tablet Commonly known as:  VESICARE Take 1 tablet (5 mg total) by mouth daily.   tamsulosin 0.4 MG Caps capsule Commonly known as:  FLOMAX TAKE 1 CAPSULE (0.4 MG TOTAL) BY MOUTH DAILY.   TUMS CHEWY BITES PO Take by mouth.       Allergies: No Known Allergies  Family History: Family History  Problem Relation Age of Onset  . Hypertension Mother   . Arthritis Father   . Other      no premature CAD.  [name] Colon cancer Neg Hx   . Prostate cancer Neg Hx   . Stomach cancer Neg Hx     Social History:  reports that he has never smoked. He has never used smokeless tobacco. He reports that he does not drink alcohol or use drugs.  ROS: UROLOGY Frequent Urination?: Yes Hard to postpone urination?: Yes Burning/pain with urination?: No Get up at night to urinate?: Yes Leakage of urine?: Yes Urine stream starts and stops?: No Trouble starting stream?: No Do you have to strain to urinate?: No Blood in urine?: No Urinary tract infection?: No Sexually transmitted disease?: No Injury to kidneys or bladder?: No Painful intercourse?: No Weak stream?: No Erection problems?: No Penile pain?: No  Gastrointestinal Nausea?: No Vomiting?: No Indigestion/heartburn?: No Diarrhea?: No Constipation?: No  Constitutional Fever: No Night sweats?: No Weight loss?: No Fatigue?: No  Skin Skin rash/lesions?: No Itching?: No  Eyes Blurred vision?: No Double vision?: No  Ears/Nose/Throat Sore throat?: No Sinus problems?: No  Hematologic/Lymphatic Swollen  glands?: No Easy bruising?: No  Cardiovascular Leg swelling?: No Chest pain?: No  Respiratory Cough?: No Shortness of breath?: No  Endocrine Excessive thirst?: No  Musculoskeletal Back pain?: No Joint pain?: No  Neurological Headaches?: No Dizziness?: No  Psychologic Depression?: No Anxiety?: No  Physical Exam: BP (!) 164/77   Pulse 68  Ht 5\' 8"  (1.727 m)   Wt 153 lb 4.8 oz (69.5 kg)   BMI 23.31 kg/m   Constitutional: Well nourished. Alert and oriented, No acute distress. HEENT: [place] AT, moist mucus membranes. Trachea midline, no masses. Cardiovascular: No clubbing, cyanosis, or edema. Respiratory: Normal respiratory effort, no increased work of breathing. GI: Abdomen is soft, non tender, non distended, no abdominal masses. Liver and spleen not palpable.  Left reducible inguinal hernias is appreciated.  Stool sample for occult testing is not indicated.   GU: No CVA tenderness.  No bladder fullness or masses.  Patient with circumcised phallus.  Urethral meatus is patent.  No penile discharge. No penile lesions or rashes. Scrotum without lesions, cysts, rashes and/or edema.  Testicles are located scrotally bilaterally. No masses are appreciated in the testicles. Left and right epididymis are normal. Rectal: Deferred.   Skin: No rashes, bruises or suspicious lesions. Lymph: No cervical or inguinal adenopathy. Neurologic: Grossly intact, no focal deficits, moving all 4 extremities. Psychiatric: Normal mood and affect.  Laboratory Data: Lab Results  Component Value Date   WBC 6.8 04/15/2016   HGB 12.1 (L) 04/15/2016   HCT 34.9 (L) 04/15/2016   MCV 91.6 04/15/2016   PLT 183 04/15/2016    Lab Results  Component Value Date   CREATININE 1.29 (H) 04/18/2016    Lab Results  Component Value Date   PSA 2.21 12/21/2015    Lab Results  Component Value Date   TSH 0.74 12/21/2015       Component Value Date/Time   CHOL 85 01/13/2015 0509   HDL 36 (L) 01/13/2015  0509   CHOLHDL 2.4 01/13/2015 0509   VLDL 12 01/13/2015 0509   LDLCALC 37 01/13/2015 0509    Lab Results  Component Value Date   AST 23 04/15/2016   Lab Results  Component Value Date   ALT 13 (L) 04/15/2016    Urinalysis Unremarkable.  See EPIC.  Pertinent Imaging: Results for ELIODORO, DAHMS (MRN [id number]) as of 05/23/2016 16:39  Ref. Range 05/23/2016 15:50  Scan Result Unknown 45    Assessment & Plan:    1. Left inguinal hernia   - reducible in the office  - warning signs given for an incarcerated/strangulated hernia and advised to seek emergency treatment if those signs present  - refer to general surgery  2. Left inguinal pain  - Urinalysis, Complete  - BLADDER SCAN AMB NON-IMAGING  3. Nocturia  - keep upcoming appointment with Dr. Matilde Sprang  4. Constipation  - advised patient to increase his water intake with his Gwendolyn Lima to 3 glasses of water (8oz)  Return for referral to general surgery.  These notes generated with voice recognition software. I apologize for typographical errors.  Zara Council, [facility] [address] [place],  [address] [phone]

## 2016-05-24 ENCOUNTER — Other Ambulatory Visit (INDEPENDENT_AMBULATORY_CARE_PROVIDER_SITE_OTHER): Payer: Medicare Other

## 2016-05-24 DIAGNOSIS — E785 Hyperlipidemia, unspecified: Secondary | ICD-10-CM | POA: Diagnosis not present

## 2016-05-24 LAB — LIPID PANEL
CHOL/HDL RATIO: 3
Cholesterol: 142 mg/dL (ref 0–200)
HDL: 50.5 mg/dL (ref >39.00)
LDL CALC: 78 mg/dL (ref 0–99)
NONHDL: 91.84
Triglycerides: 69 mg/dL (ref 0.0–149.0)
VLDL: 13.8 mg/dL (ref 0.0–40.0)

## 2016-06-05 ENCOUNTER — Encounter: Payer: Self-pay | Admitting: General Surgery

## 2016-06-05 ENCOUNTER — Ambulatory Visit (INDEPENDENT_AMBULATORY_CARE_PROVIDER_SITE_OTHER): Payer: Medicare Other | Admitting: General Surgery

## 2016-06-05 VITALS — BP 144/72 | HR 62 | Resp 14 | Ht 68.0 in | Wt 152.0 lb

## 2016-06-05 DIAGNOSIS — K4090 Unilateral inguinal hernia, without obstruction or gangrene, not specified as recurrent: Secondary | ICD-10-CM | POA: Diagnosis not present

## 2016-06-05 NOTE — Progress Notes (Signed)
Patient ID: Frank Mcdaniel, male   DOB: 01/06/1933, 82 y.o.   MRN: [id number]  Chief Complaint  Patient presents with  . Hernia    HPI Frank Mcdaniel is a 82 y.o. male here today for a evaluation of a left inguinal hernia . He noticed this area about three weeks ago. He states the area was large but has got smaller in the last week. He has had a lot of constipation.His last colonoscopy was done on 03/13/2007. Son [name] was present at visit.  HPI  Past Medical History:  Diagnosis Date  . Anemia 09/01/2015  . Anxiety 09/11/2015  . Asthma   . Asymptomatic Sinus Bradycardia   . Atypical chest pain    a. 08/2014  . BPH (benign prostatic hyperplasia)   . CKD (chronic kidney disease), stage III   . COPD (chronic obstructive pulmonary disease) ([place])   . Coronary artery disease    a. 2000 s/p CABG;  b. 10/2008 Neg MV, EF 64%; c. cath 05/2015: LM 85% sev cal, pLAD-1 lesion 80%, pLAD-2 lesion 100% chronic, ostLCx 70% sev cal, OM4 90%, ostRCA 70%, mid RCA 90%, LIMA-LAD patent, VG-OM2 patent, VG-OM3 patent, VG-RPDA patent. No AS  . GERD (gastroesophageal reflux disease)   . H/O echocardiogram    a. 10/2005 Echo: nl EF.  [name] Hairy cell leukemia ([place])   . HTN (hypertension)   . Hyperlipidemia   . Hypertension   . Leukemia ([place])   . Shortness of breath dyspnea     Past Surgical History:  Procedure Laterality Date  . CARDIAC CATHETERIZATION N/A 05/26/2015   Procedure: Left Heart Cath;  Surgeon: [name], MD;  Location: [facility];  Service: Cardiovascular;  Laterality: N/A;  . CARDIAC CATHETERIZATION N/A 05/26/2015   Procedure: Coronary/Graft Angiography;  Surgeon: [name], MD;  Location: [facility];  Service: Cardiovascular;  Laterality: N/A;  . CATARACT EXTRACTION    . CORONARY ARTERY BYPASS GRAFT  2000  . TONSILLECTOMY  1942   . UPPER GASTROINTESTINAL ENDOSCOPY  3 years ago    with Dilation.     Family History  Problem Relation Age of Onset  .  Hypertension Mother   . Arthritis Father   . Other      no premature CAD.  [name] Colon cancer Neg Hx   . Prostate cancer Neg Hx   . Stomach cancer Neg Hx     Social History Social History  Substance Use Topics  . Smoking status: Never Smoker  . Smokeless tobacco: Never Used  . Alcohol use No    No Known Allergies  Current Outpatient Prescriptions  Medication Sig Dispense Refill  . aspirin 81 MG EC tablet Take 2 tablets (162 mg total) by mouth daily. 30 tablet 0  . atorvastatin (LIPITOR) 40 MG tablet Take 0.5 tablets (20 mg total) by mouth daily. 45 tablet 3  . Calcium Carbonate Antacid (TUMS CHEWY BITES PO) Take by mouth.    . isosorbide mononitrate (IMDUR) 30 MG 24 hr tablet Take 1 tablet (30 mg total) by mouth daily. 90 tablet 3  . Multiple Vitamin (MULTIVITAMIN) tablet Take 1 tablet by mouth daily.      . prochlorperazine (COMPAZINE) 10 MG tablet TAKE 1 TABLET (10 MG TOTAL) BY MOUTH EVERY 6 (SIX) HOURS AS NEEDED FOR NAUSEA OR VOMITING. 30 tablet 0  . ranitidine (ZANTAC) 150 MG tablet Take 150 mg by mouth 2 (two) times daily. Reported on 12/25/2015    . solifenacin (  VESICARE) 5 MG tablet Take 5 mg by mouth daily.    . tamsulosin (FLOMAX) 0.4 MG CAPS capsule TAKE 1 CAPSULE (0.4 MG TOTAL) BY MOUTH DAILY. 30 capsule 3   No current facility-administered medications for this visit.    Facility-Administered Medications Ordered in Other Visits  Medication Dose Route Frequency Provider Last Rate Last Dose  . sodium chloride 0.9 % injection 10 mL  10 mL Intracatheter PRN [name], MD      . sodium chloride 0.9 % injection 10 mL  10 mL Intracatheter PRN [name], MD        Review of Systems Review of Systems  Constitutional: Negative.  Negative for unexpected weight change.  HENT: Negative.   Eyes: Negative.   Respiratory: Negative.   Gastrointestinal: Positive for constipation (resolved with the institution of MiraLAX every 3 days and increased free water.).   Endocrine: Negative.   Genitourinary: Positive for frequency (decreased from 6 times per night to 3 times per night with recent oral medication.).  Musculoskeletal: Negative.   Allergic/Immunologic: Negative.   Neurological: Negative.   Hematological: Negative.   Psychiatric/Behavioral: Negative.     Blood pressure (!) 144/72, pulse 62, resp. rate 14, height 5\' 8"  (1.727 m), weight 152 lb (68.9 kg).  Physical Exam Physical Exam  Constitutional: He is oriented to person, place, and time. He appears well-developed and well-nourished.  Eyes: Conjunctivae are normal. No scleral icterus.  Neck: Neck supple.  Cardiovascular: Normal rate, regular rhythm and normal heart sounds.   Pulmonary/Chest: Effort normal and breath sounds normal.  Abdominal: Soft. Normal appearance and bowel sounds are normal. There is no tenderness. A hernia is present. Hernia confirmed positive in the left inguinal area.    Lymphadenopathy:    He has no cervical adenopathy.  Neurological: He is alert and oriented to person, place, and time.  Skin: Skin is warm and dry.    Data Reviewed Laboratory studies dated 04/15/2016 showed a hemoglobin of 12.1 with an MCV of 91.6. White blood cell count of 6800. Platelet count of 183,000.  Competency metabolic panel of the same date showed a blood sugar of 117. Creatinine 1.26 with an estimated GFR of 52. Her graft urinalysis dated 05/23/2016 notable for elevated specific gravity of greater than 1.030. Negative dipstick.  Stool Hemoccult 11/28/2014 was negative 3.  Assessment    Symptomatic left inguinal hernia. Likely tight defect based on difficulty of reduction.    Plan    The patient report of increased constipation last 3-4 months suggested a colonoscopy could be considered. He's had quick resolution of his symptoms with the addition of MiraLAX every 3 days. Last colonoscopy completed was about 9 years ago.  If his constipation symptoms persist post hernia  repair would give strong consideration of elective colonoscopy.    Hernia precautions and incarceration were discussed with the patient. If they develop symptoms of an incarcerated hernia, they were encouraged to seek prompt medical attention.  I have recommended repair of the hernia using mesh on an outpatient basis in the near future. The risk of infection was reviewed. The role of prosthetic mesh to minimize the risk of recurrence was reviewed.  The patient will continue his present 162 mg aspirin dose daily.  Patient's surgery has been scheduled for 06-24-16 at [facility]   This information has been scribed by Gaspar Cola CMA.   [name] 06/05/2016, 8:01 PM

## 2016-06-05 NOTE — Patient Instructions (Signed)

## 2016-06-14 ENCOUNTER — Encounter
Admission: RE | Admit: 2016-06-14 | Discharge: 2016-06-14 | Disposition: A | Discharge status: Home / Self Care | Payer: Medicare Other | Source: Ambulatory Visit | Attending: General Surgery | Admitting: General Surgery

## 2016-06-14 ENCOUNTER — Other Ambulatory Visit: Payer: Self-pay | Admitting: Family Medicine

## 2016-06-14 DIAGNOSIS — Z01812 Encounter for preprocedural laboratory examination: Secondary | ICD-10-CM | POA: Insufficient documentation

## 2016-06-14 HISTORY — DX: Unspecified hearing loss, unspecified ear: H91.90

## 2016-06-14 HISTORY — DX: Unspecified osteoarthritis, unspecified site: M19.90

## 2016-06-14 NOTE — Patient Instructions (Signed)
  Your procedure is scheduled on: June 24, 2016 (Monday) Report to Same Day Surgery 2nd floor Medical  [facility] To find out your arrival time please call [phone] between 1PM - 3PM on June 21, 2016 Friday)  Remember: Instructions that are not followed completely may result in serious medical risk, up to and including death, or upon the discretion of your surgeon and anesthesiologist your surgery may need to be rescheduled.    _x___ 1. Do not eat food or drink liquids after midnight. No gum chewing or hard candies.     __x__ 2. No Alcohol for 24 hours before or after surgery.   __x__3. No Smoking for 24 prior to surgery.   ____  4. Bring all medications with you on the day of surgery if instructed.    __x__ 5. Notify your doctor if there is any change in your medical condition     (cold, fever, infections).     Do not wear jewelry, make-up, hairpins, clips or nail polish.  Do not wear lotions, powders, or perfumes. You may wear deodorant.  Do not shave 48 hours prior to surgery. Men may shave face and neck.  Do not bring valuables to the hospital.    [facility] is not responsible for any belongings or valuables.               Contacts, dentures or bridgework may not be worn into surgery.  Leave your suitcase in the car. After surgery it may be brought to your room.  For patients admitted to the hospital, discharge time is determined by your treatment team.   Patients discharged the day of surgery will not be allowed to drive home.    Please read over the following fact sheets that you were given:   [facility] Preparing for Surgery and or MRSA Information   _x___ Take these medicines the morning of surgery with A SIP OF WATER:    1. Atorvastatin  2. Imdur  3. Ranitidine  4. Ranexa  5.  6.  ____Fleets enema or Magnesium Citrate as directed.   _x___ Use CHG Soap or sage wipes as directed on instruction sheet   ____ Use inhalers on the day of surgery and bring to  hospital day of surgery  ____ Stop metformin 2 days prior to surgery    ____ Take 1/2 of usual insulin dose the night before surgery and none on the morning of surgery            __x__ Stop aspirin or coumadin, or plavix (DO NOT TAKE ASPIRIN THE DAY OF SURGERY, MAY TAKE ONCE YOU ARE AT HOME AFTER SURGERY)  x__ Stop Anti-inflammatories such as Advil, Aleve, Ibuprofen, Motrin, Naproxen,          Naprosyn, Goodies powders or aspirin products. Ok to take Tylenol.   ____ Stop supplements until after surgery.    ____ Bring C-Pap to the hospital.

## 2016-06-17 ENCOUNTER — Ambulatory Visit (INDEPENDENT_AMBULATORY_CARE_PROVIDER_SITE_OTHER): Payer: Medicare Other | Admitting: Urology

## 2016-06-17 ENCOUNTER — Encounter: Payer: Self-pay | Admitting: Urology

## 2016-06-17 VITALS — BP 118/57 | HR 65 | Ht 66.0 in | Wt 154.0 lb

## 2016-06-17 DIAGNOSIS — R351 Nocturia: Secondary | ICD-10-CM | POA: Diagnosis not present

## 2016-06-17 NOTE — Progress Notes (Signed)
06/17/2016 4:08 PM   Frank Mcdaniel 1933-03-26 [id number]  Referring provider: [name], MD [address] [place], [place] [address]  Chief Complaint  Patient presents with  . Nocturia    8 weeks    HPI: For many months the patient has mildly worsening urgency and frequency. He has urge incontinence not every day and does not wear a pad. This actually has improved some. He voids every 1-2 hours and hourly at night. He has a cardiac history. His flow is reasonable  He is on Flomax  50-60 g benign prostate  The patient was bothered by his intermittent urge incontinence and frequency. I added the beta 3 agonist to his Flomax. The possibility of stopping the Flomax in the future was discussed. He had been on it for years and it was difficult to say how much it was helping  The patient did have a positive culture recently. I was suspected he may be chronically colonized. He can get up 10 times at night.  The patient has significant nighttime frequency but does much better during the day. I will continue to keep him on Flomax. I gave him Vesicare 5 mg samples and prescription and Toviaz 4 mg samples and prescription reevaluate in 2 months. He likely has a nocturnal diuresis with a cardiac history as well. I will discuss percutaneous tibial nerve stimulation next visit if the medications have not helped   Today Frequency stable Nighttime frequency is 4 or 5 and not 9 or 10. The Jordan worked better. I will see him in a month on 10 mg      PMH: Past Medical History:  Diagnosis Date  . Anemia 09/01/2015  . Anxiety 09/11/2015  . Arthritis   . Asthma    as a teenager  . Asymptomatic Sinus Bradycardia   . Atypical chest pain    a. 08/2014  . BPH (benign prostatic hyperplasia)   . CKD (chronic kidney disease), stage III   . Coronary artery disease    a. 2000 s/p CABG;  b. 10/2008 Neg MV, EF 64%; c. cath 05/2015: LM 85% sev cal, pLAD-1 lesion 80%, pLAD-2  lesion 100% chronic, ostLCx 70% sev cal, OM4 90%, ostRCA 70%, mid RCA 90%, LIMA-LAD patent, VG-OM2 patent, VG-OM3 patent, VG-RPDA patent. No AS  . GERD (gastroesophageal reflux disease)   . H/O echocardiogram    a. 10/2005 Echo: nl EF.  [name] Hairy cell leukemia ([place])   . HOH (hard of hearing)    Bilateral hearing aids  . HTN (hypertension)   . Hyperlipidemia   . Hypertension   . Leukemia [facility])     Surgical History: Past Surgical History:  Procedure Laterality Date  . CARDIAC CATHETERIZATION N/A 05/26/2015   Procedure: Left Heart Cath;  Surgeon: [name], MD;  Location: [facility];  Service: Cardiovascular;  Laterality: N/A;  . CARDIAC CATHETERIZATION N/A 05/26/2015   Procedure: Coronary/Graft Angiography;  Surgeon: [name], MD;  Location: [facility];  Service: Cardiovascular;  Laterality: N/A;  . CATARACT EXTRACTION    . CORONARY ARTERY BYPASS GRAFT  2000  . EYE SURGERY Left    Cataract Extraction with IOL  . TONSILLECTOMY  1942   . UPPER GASTROINTESTINAL ENDOSCOPY  3 years ago    with Dilation.     Home Medications:    Medication List       Accurate as of 06/17/16  4:08 PM. Always use your most recent med list.  aspirin 81 MG EC tablet Take 2 tablets (162 mg total) by mouth daily.   atorvastatin 40 MG tablet Commonly known as:  LIPITOR Take 0.5 tablets (20 mg total) by mouth daily.   isosorbide mononitrate 30 MG 24 hr tablet Commonly known as:  IMDUR Take 1 tablet (30 mg total) by mouth daily.   multivitamin tablet Take 1 tablet by mouth daily.   prochlorperazine 10 MG tablet Commonly known as:  COMPAZINE TAKE 1 TABLET (10 MG TOTAL) BY MOUTH EVERY 6 (SIX) HOURS AS NEEDED FOR NAUSEA OR VOMITING.   RANEXA 500 MG 12 hr tablet Generic drug:  ranolazine Take 1 tablet by mouth 2 (two) times daily.   ranitidine 150 MG tablet Commonly known as:  ZANTAC Take 150 mg by mouth 2 (two) times daily. Reported on 12/25/2015     tamsulosin 0.4 MG Caps capsule Commonly known as:  FLOMAX TAKE 1 CAPSULE (0.4 MG TOTAL) BY MOUTH DAILY.   TUMS CHEWY BITES PO Take 1 tablet by mouth as needed.   VESICARE 5 MG tablet Generic drug:  solifenacin Take 5 mg by mouth daily.       Allergies: No Known Allergies  Family History: Family History  Problem Relation Age of Onset  . Hypertension Mother   . Arthritis Father   . Other      no premature CAD.  [name] Colon cancer Neg Hx   . Prostate cancer Neg Hx   . Stomach cancer Neg Hx     Social History:  reports that he has never smoked. He has never used smokeless tobacco. He reports that he does not drink alcohol or use drugs.  ROS: UROLOGY Frequent Urination?: No Hard to postpone urination?: No Burning/pain with urination?: No Get up at night to urinate?: Yes Leakage of urine?: No Urine stream starts and stops?: No Trouble starting stream?: No Do you have to strain to urinate?: No Blood in urine?: No Urinary tract infection?: No Sexually transmitted disease?: No Injury to kidneys or bladder?: No Painful intercourse?: No Weak stream?: No Erection problems?: No Penile pain?: No  Gastrointestinal Nausea?: No Vomiting?: No Indigestion/heartburn?: No Diarrhea?: No Constipation?: Yes  Constitutional Fever: No Night sweats?: No Weight loss?: No Fatigue?: No  Skin Skin rash/lesions?: No Itching?: No  Eyes Blurred vision?: No Double vision?: No  Ears/Nose/Throat Sore throat?: No Sinus problems?: No  Hematologic/Lymphatic Swollen glands?: No Easy bruising?: No  Cardiovascular Leg swelling?: No Chest pain?: No  Respiratory Cough?: No Shortness of breath?: No  Endocrine Excessive thirst?: No  Musculoskeletal Back pain?: No Joint pain?: No  Neurological Headaches?: No Dizziness?: No  Psychologic Depression?: No Anxiety?: No  Physical Exam: BP (!) 118/57   Pulse 65   Ht 5\' 6"  (1.676 m)   Wt 154 lb (69.9 kg)   BMI 24.86  kg/m     Laboratory Data: Lab Results  Component Value Date   WBC 6.8 04/15/2016   HGB 12.1 (L) 04/15/2016   HCT 34.9 (L) 04/15/2016   MCV 91.6 04/15/2016   PLT 183 04/15/2016    Lab Results  Component Value Date   CREATININE 1.29 (H) 04/18/2016    Lab Results  Component Value Date   PSA 2.21 12/21/2015    No results found for: TESTOSTERONE  No results found for: HGBA1C  Urinalysis    Component Value Date/Time   COLORURINE Yellow 09/01/2014 1142   APPEARANCEUR Clear 05/23/2016 1554   LABSPEC 1.013 09/01/2014 1142   PHURINE 5.0 09/01/2014 1142  GLUCOSEU Negative 05/23/2016 1554   GLUCOSEU Negative 09/01/2014 1142   HGBUR Negative 09/01/2014 1142   BILIRUBINUR Negative 05/23/2016 1554   BILIRUBINUR Negative 09/01/2014 1142   KETONESUR Negative 09/01/2014 1142   PROTEINUR Negative 05/23/2016 1554   PROTEINUR Negative 09/01/2014 1142   UROBILINOGEN 0.2 12/21/2015 1132   NITRITE Negative 05/23/2016 1554   NITRITE Negative 09/01/2014 1142   LEUKOCYTESUR Negative 05/23/2016 1554   LEUKOCYTESUR Negative 09/01/2014 1142    Pertinent Imaging: none  Assessment & Plan:  I will see the patient in 4 weeks on Vesicare 10 mg and either in combination or as a monotherapy recommend percutaneous tibial nerve stimulation depend upon the results  There are no diagnoses linked to this encounter.  No Follow-up on file.  Reece Packer, MD  [facility] [address] [place], [place] [address] [phone]

## 2016-06-18 ENCOUNTER — Telehealth: Payer: Self-pay | Admitting: Cardiovascular Disease

## 2016-06-18 ENCOUNTER — Encounter
Admission: RE | Admit: 2016-06-18 | Discharge: 2016-06-18 | Disposition: A | Discharge status: Home / Self Care | Payer: Medicare Other | Source: Ambulatory Visit | Attending: General Surgery | Admitting: General Surgery

## 2016-06-18 DIAGNOSIS — Z01812 Encounter for preprocedural laboratory examination: Secondary | ICD-10-CM | POA: Insufficient documentation

## 2016-06-18 LAB — CBC
HCT: 38.1 % — ABNORMAL LOW (ref 40.0–52.0)
HEMOGLOBIN: 13.3 g/dL (ref 13.0–18.0)
MCH: 32.3 pg (ref 26.0–34.0)
MCHC: 34.9 g/dL (ref 32.0–36.0)
MCV: 92.5 fL (ref 80.0–100.0)
PLATELETS: 162 10*3/uL (ref 150–440)
RBC: 4.12 MIL/uL — AB (ref 4.40–5.90)
RDW: 14.3 % (ref 11.5–14.5)
WBC: 5.7 10*3/uL (ref 3.8–10.6)

## 2016-06-18 LAB — DIFFERENTIAL
Basophils Absolute: 0 10*3/uL (ref 0–0.1)
Basophils Relative: 0 %
EOS ABS: 0.2 10*3/uL (ref 0–0.7)
EOS PCT: 3 %
LYMPHS ABS: 0.5 10*3/uL — AB (ref 1.0–3.6)
LYMPHS PCT: 8 %
MONO ABS: 0.7 10*3/uL (ref 0.2–1.0)
Monocytes Relative: 13 %
NEUTROS PCT: 76 %
Neutro Abs: 4.3 10*3/uL (ref 1.4–6.5)

## 2016-06-18 NOTE — Telephone Encounter (Signed)
Left message for pt to call back  °

## 2016-06-18 NOTE — Telephone Encounter (Signed)
Patient c/o constipation since starting Renexa.  Is there something he can do ?  Please call.

## 2016-06-18 NOTE — Telephone Encounter (Signed)
Spoke w/ pt.  Advised him to try to increase his fluids & exercise. Advised him to try miralax & citrucel. He has been trying the store brand of miralax w/ little relief.  Asked him to call back if his sx do not improved and if he would like to try to hold Ranexa.  He is appreciative of the call.

## 2016-06-23 MED ORDER — CEFAZOLIN SODIUM-DEXTROSE 2-4 GM/100ML-% IV SOLN
2.0000 g | INTRAVENOUS | Status: AC
  Administered 2016-06-24: 1 g via INTRAVENOUS
  Administered 2016-06-24: 2 g via INTRAVENOUS

## 2016-06-24 ENCOUNTER — Ambulatory Visit: Payer: Medicare Other | Admitting: Anesthesiology

## 2016-06-24 ENCOUNTER — Encounter
Admission: RE | Disposition: A | Discharge status: Home / Self Care | Payer: Self-pay | Source: Ambulatory Visit | Attending: General Surgery

## 2016-06-24 ENCOUNTER — Ambulatory Visit
Admission: RE | Admit: 2016-06-24 | Discharge: 2016-06-24 | Disposition: A | Discharge status: Home / Self Care | Payer: Medicare Other | Source: Ambulatory Visit | Attending: General Surgery | Admitting: General Surgery

## 2016-06-24 ENCOUNTER — Encounter: Payer: Self-pay | Admitting: Anesthesiology

## 2016-06-24 DIAGNOSIS — Z856 Personal history of leukemia: Secondary | ICD-10-CM | POA: Diagnosis not present

## 2016-06-24 DIAGNOSIS — Z7982 Long term (current) use of aspirin: Secondary | ICD-10-CM | POA: Diagnosis not present

## 2016-06-24 DIAGNOSIS — E785 Hyperlipidemia, unspecified: Secondary | ICD-10-CM | POA: Diagnosis not present

## 2016-06-24 DIAGNOSIS — K219 Gastro-esophageal reflux disease without esophagitis: Secondary | ICD-10-CM | POA: Diagnosis not present

## 2016-06-24 DIAGNOSIS — N183 Chronic kidney disease, stage 3 (moderate): Secondary | ICD-10-CM | POA: Insufficient documentation

## 2016-06-24 DIAGNOSIS — K4090 Unilateral inguinal hernia, without obstruction or gangrene, not specified as recurrent: Secondary | ICD-10-CM

## 2016-06-24 DIAGNOSIS — D176 Benign lipomatous neoplasm of spermatic cord: Secondary | ICD-10-CM | POA: Insufficient documentation

## 2016-06-24 DIAGNOSIS — Z951 Presence of aortocoronary bypass graft: Secondary | ICD-10-CM | POA: Diagnosis not present

## 2016-06-24 DIAGNOSIS — Z79899 Other long term (current) drug therapy: Secondary | ICD-10-CM | POA: Diagnosis not present

## 2016-06-24 DIAGNOSIS — I129 Hypertensive chronic kidney disease with stage 1 through stage 4 chronic kidney disease, or unspecified chronic kidney disease: Secondary | ICD-10-CM | POA: Diagnosis not present

## 2016-06-24 DIAGNOSIS — I2510 Atherosclerotic heart disease of native coronary artery without angina pectoris: Secondary | ICD-10-CM | POA: Insufficient documentation

## 2016-06-24 DIAGNOSIS — N400 Enlarged prostate without lower urinary tract symptoms: Secondary | ICD-10-CM | POA: Diagnosis not present

## 2016-06-24 HISTORY — PX: INGUINAL HERNIA REPAIR: SHX194

## 2016-06-24 SURGERY — REPAIR, HERNIA, INGUINAL, ADULT
Anesthesia: General | Laterality: Left | Wound class: Clean Contaminated

## 2016-06-24 MED ORDER — GLYCOPYRROLATE 0.2 MG/ML IJ SOLN
INTRAMUSCULAR | Status: DC | PRN
  Administered 2016-06-24: 0.2 mg via INTRAVENOUS

## 2016-06-24 MED ORDER — MIDAZOLAM HCL 2 MG/2ML IJ SOLN
INTRAMUSCULAR | Status: DC | PRN
  Administered 2016-06-24: 1 mg via INTRAVENOUS

## 2016-06-24 MED ORDER — LACTATED RINGERS IV SOLN
INTRAVENOUS | Status: DC
  Administered 2016-06-24 (×2): via INTRAVENOUS

## 2016-06-24 MED ORDER — FENTANYL CITRATE (PF) 100 MCG/2ML IJ SOLN: 25.0000 ug | INTRAMUSCULAR | Status: DC | PRN

## 2016-06-24 MED ORDER — ONDANSETRON HCL 4 MG/2ML IJ SOLN: 4.0000 mg | Freq: Once | INTRAMUSCULAR | Status: DC | PRN

## 2016-06-24 MED ORDER — CEFAZOLIN SODIUM-DEXTROSE 2-4 GM/100ML-% IV SOLN
INTRAVENOUS | Status: AC
  Administered 2016-06-24: 2 g via INTRAVENOUS
  Filled 2016-06-24: qty 100

## 2016-06-24 MED ORDER — BUPIVACAINE-EPINEPHRINE (PF) 0.5% -1:200000 IJ SOLN
INTRAMUSCULAR | Status: DC | PRN
  Administered 2016-06-24: 30 mL via PERINEURAL

## 2016-06-24 MED ORDER — HYDROCODONE-ACETAMINOPHEN 5-325 MG PO TABS: 1.0000 | ORAL_TABLET | ORAL | 0 refills | Status: DC | PRN

## 2016-06-24 MED ORDER — BUPIVACAINE-EPINEPHRINE (PF) 0.5% -1:200000 IJ SOLN
INTRAMUSCULAR | Status: AC
  Filled 2016-06-24: qty 30

## 2016-06-24 MED ORDER — SOLIFENACIN SUCCINATE 5 MG PO TABS: 10.0000 mg | ORAL_TABLET | Freq: Every day | ORAL | 0 refills | Status: DC

## 2016-06-24 MED ORDER — EPHEDRINE SULFATE 50 MG/ML IJ SOLN
INTRAMUSCULAR | Status: DC | PRN
  Administered 2016-06-24: 10 mg via INTRAVENOUS

## 2016-06-24 MED ORDER — KETOROLAC TROMETHAMINE 30 MG/ML IJ SOLN
INTRAMUSCULAR | Status: DC | PRN
  Administered 2016-06-24: 30 mg via INTRAVENOUS

## 2016-06-24 SURGICAL SUPPLY — 36 items
BENZOIN TINCTURE PRP APPL 2/3 (GAUZE/BANDAGES/DRESSINGS) ×3 IMPLANT
BLADE SURG 15 STRL SS SAFETY (BLADE) ×6 IMPLANT
CANISTER SUCT 1200ML W/VALVE (MISCELLANEOUS) ×3 IMPLANT
CHLORAPREP W/TINT 26ML (MISCELLANEOUS) ×3 IMPLANT
CLOSURE WOUND 1/2 X4 (GAUZE/BANDAGES/DRESSINGS) ×1
CLOSURE WOUND 1/4X4 (GAUZE/BANDAGES/DRESSINGS) ×1
DECANTER SPIKE VIAL GLASS SM (MISCELLANEOUS) ×3 IMPLANT
DRAIN PENROSE 1/4X12 LTX (DRAIN) ×3 IMPLANT
DRAPE LAPAROTOMY 100X77 ABD (DRAPES) ×3 IMPLANT
DRESSING TELFA 4X3 1S ST N-ADH (GAUZE/BANDAGES/DRESSINGS) ×3 IMPLANT
DRSG TEGADERM 4X4.75 (GAUZE/BANDAGES/DRESSINGS) ×3 IMPLANT
ELECT REM PT RETURN 9FT ADLT (ELECTROSURGICAL) ×3
ELECTRODE REM PT RTRN 9FT ADLT (ELECTROSURGICAL) ×1 IMPLANT
GLOVE BIO SURGEON STRL SZ7.5 (GLOVE) ×3 IMPLANT
GLOVE INDICATOR 8.0 STRL GRN (GLOVE) ×3 IMPLANT
GOWN STRL REUS W/ TWL LRG LVL3 (GOWN DISPOSABLE) ×2 IMPLANT
GOWN STRL REUS W/TWL LRG LVL3 (GOWN DISPOSABLE) ×4
KIT RM TURNOVER STRD PROC AR (KITS) ×3 IMPLANT
LABEL OR SOLS (LABEL) ×3 IMPLANT
MESH HERNIA SYS ULTRAPRO LRG (Mesh General) ×3 IMPLANT
NDL SAFETY 22GX1.5 (NEEDLE) ×6 IMPLANT
NEEDLE HYPO 25X1 1.5 SAFETY (NEEDLE) ×3 IMPLANT
PACK BASIN MINOR ARMC (MISCELLANEOUS) ×3 IMPLANT
STRIP CLOSURE SKIN 1/2X4 (GAUZE/BANDAGES/DRESSINGS) ×2 IMPLANT
STRIP CLOSURE SKIN 1/4X4 (GAUZE/BANDAGES/DRESSINGS) ×2 IMPLANT
SUT SURGILON 0 BLK (SUTURE) ×6 IMPLANT
SUT VIC AB 2-0 SH 27 (SUTURE) ×2
SUT VIC AB 2-0 SH 27XBRD (SUTURE) ×1 IMPLANT
SUT VIC AB 3-0 54X BRD REEL (SUTURE) ×1 IMPLANT
SUT VIC AB 3-0 BRD 54 (SUTURE) ×2
SUT VIC AB 3-0 SH 27 (SUTURE) ×2
SUT VIC AB 3-0 SH 27X BRD (SUTURE) ×1 IMPLANT
SUT VIC AB 4-0 FS2 27 (SUTURE) ×3 IMPLANT
SWABSTK COMLB BENZOIN TINCTURE (MISCELLANEOUS) ×3 IMPLANT
SYR 3ML LL SCALE MARK (SYRINGE) ×3 IMPLANT
SYR CONTROL 10ML (SYRINGE) ×6 IMPLANT

## 2016-06-24 NOTE — Transfer of Care (Signed)
Immediate Anesthesia Transfer of Care Note  Patient: Frank Mcdaniel  Procedure(s) Performed: Procedure(s): HERNIA REPAIR INGUINAL ADULT (Left)  Patient Location: PACU  Anesthesia Type:General  Level of Consciousness: sedated  Airway & Oxygen Therapy: Patient Spontanous Breathing and Patient connected to face mask oxygen  Post-op Assessment:Report given, Post op VSS  Post vital signs: Reviewed and stable  Last Vitals:  Vitals:   06/24/16 0613 06/24/16 0828  BP: 117/72 114/61  Pulse: 68 79  Resp: 14 (!) 21  Temp: 36.9 C 36.7 C    Last Pain:  Vitals:   06/24/16 0613  TempSrc: Oral  PainSc: 0-No pain         Complications: No apparent anesthesia complications

## 2016-06-24 NOTE — Op Note (Signed)
Preoperative diagnosis: Symptomatic left inguinal hernia.  Postoperative diagnosis: Same.  Operative procedure: Left inguinal hernia repair with large Ultra Pro mesh.  Operating surgeon: Mervin Kung, M.D.  Anesthesia: Gen. by LMA, Marcaine 0.5% with 1-200,000 of epinephrine, 30 mL; Toradol 30 mg.  Clinical note: This 82 year old male has developed a symptomatic left inguinal hernia and was later for elective repair. Surgical site was cleared with clippers prior to presentation to the operating theater. He received Kefzol prior to the procedure.  Operative note: After the induction of general anesthesia the area was prepped with ChloraPrep and draped. A 5 cm skin line incision along the anticipated course of the inguinal canal was carried out to the skin a subtenon's tissue with hemostasis achieved by electrocautery. The external oblique was opened in the direction of its fibers. While this was being done with Metzenbaum scissors the iliohypogastric nerve was divided. This was ligated with a 3-0 Vicryl tie to minimize seroma formation. The patient's son was notified at the end of the procedure this had occurred. The ileo-inguinal nerve was identified and protected. A large indirect sac was separated from the cord. A lipoma of the cord was excised and discarded. A large ultra Pro mesh was smoothed into the preperitoneal space. The external component was laid along the inguinal canal and anchored to the pubic tubercle with interrupted 0 Surgilon sutures. The inferior edge of the mesh was anchored to the inguinal ligament with interrupted 0 Surgilon sutures. The medial and superior borders were anchored to the transverse abdominis aponeurosis. A lateral slit was made for cord passage. Toradol was placed in the wound. Field block anesthesia had been established at the beginning the procedure. The external oblique was closed with a running 2-0 Vicryl. The layer Scarpa's fascia was closed with a running 3-0  Vicryl suture and the skin closed with running 4-0 Vicryl septic suture. Benzoin, Steri-Strips, Telfa and Tegaderm dressing applied.  The patient tolerated the procedure well and was taken to recovery room in stable condition.

## 2016-06-24 NOTE — Anesthesia Postprocedure Evaluation (Signed)
Anesthesia Post Note  Patient: Frank Mcdaniel  Procedure(s) Performed: Procedure(s) (LRB): HERNIA REPAIR INGUINAL ADULT (Left)  Patient location during evaluation: PACU Anesthesia Type: General Level of consciousness: awake and alert Pain management: pain level controlled Vital Signs Assessment: post-procedure vital signs reviewed and stable Respiratory status: spontaneous breathing, nonlabored ventilation, respiratory function stable and patient connected to nasal cannula oxygen Cardiovascular status: blood pressure returned to baseline and stable Postop Assessment: no signs of nausea or vomiting Anesthetic complications: no    Last Vitals:  Vitals:   06/24/16 0922 06/24/16 0940  BP: (!) 105/58 (!) 113/54  Pulse: 63 62  Resp: 16 16  Temp: 36.8 C     Last Pain:  Vitals:   06/24/16 0922  TempSrc:   PainSc: 0-No pain                 Altheia Shafran S

## 2016-06-24 NOTE — H&P (Signed)
No change in clinical history or exam. Less urinary frequency on Vesicare. For left inguinal hernia repair.

## 2016-06-24 NOTE — Anesthesia Preprocedure Evaluation (Signed)
Anesthesia Evaluation  Patient identified by MRN, date of birth, ID band Patient awake    Reviewed: Allergy & Precautions, NPO status , Patient's Chart, lab work & pertinent test results, reviewed documented beta blocker date and time   Airway Mallampati: II  TM Distance: >3 FB     Dental  (+) Chipped   Pulmonary asthma ,           Cardiovascular hypertension, Pt. on medications + angina + CAD and + CABG       Neuro/Psych Anxiety    GI/Hepatic GERD  Controlled,  Endo/Other    Renal/GU Renal disease     Musculoskeletal  (+) Arthritis ,   Abdominal   Peds  Hematology  (+) anemia ,   Anesthesia Other Findings EF 60. Chronic low sats.  Reproductive/Obstetrics                             Anesthesia Physical Anesthesia Plan  ASA: III  Anesthesia Plan: General   Post-op Pain Management:    Induction: Intravenous  Airway Management Planned: LMA  Additional Equipment:   Intra-op Plan:   Post-operative Plan:   Informed Consent: I have reviewed the patients History and Physical, chart, labs and discussed the procedure including the risks, benefits and alternatives for the proposed anesthesia with the patient or authorized representative who has indicated his/her understanding and acceptance.     Plan Discussed with: CRNA  Anesthesia Plan Comments:         Anesthesia Quick Evaluation

## 2016-06-24 NOTE — Discharge Instructions (Signed)

## 2016-07-01 ENCOUNTER — Encounter: Payer: Self-pay | Admitting: General Surgery

## 2016-07-01 ENCOUNTER — Ambulatory Visit (INDEPENDENT_AMBULATORY_CARE_PROVIDER_SITE_OTHER): Payer: Medicare Other | Admitting: General Surgery

## 2016-07-01 VITALS — BP 132/76 | HR 62 | Resp 12 | Ht 68.0 in | Wt 154.0 lb

## 2016-07-01 DIAGNOSIS — K4090 Unilateral inguinal hernia, without obstruction or gangrene, not specified as recurrent: Secondary | ICD-10-CM

## 2016-07-01 NOTE — Patient Instructions (Signed)
Use good judgement with lifting.  Call if any questions or concerns.

## 2016-07-01 NOTE — Progress Notes (Signed)
Patient ID: Frank Mcdaniel, male   DOB: 1933/07/14, 82 y.o.   MRN: [id number]  Chief Complaint  Patient presents with  . Routine Post Op    Left inguinal hernia     HPI Frank Mcdaniel is a 82 y.o. male is here today for a post op Left inguinal hernia repair done on 06/24/16. Patient states he is doing well, he did not need any narcotic pain medication.  He is accompanied today by his son who was present for the interview and exam.   HPI  Past Medical History:  Diagnosis Date  . Anemia 09/01/2015  . Anxiety 09/11/2015  . Arthritis   . Asthma    as a teenager  . Asymptomatic Sinus Bradycardia   . Atypical chest pain    a. 08/2014  . BPH (benign prostatic hyperplasia)   . CKD (chronic kidney disease), stage III   . Coronary artery disease    a. 2000 s/p CABG;  b. 10/2008 Neg MV, EF 64%; c. cath 05/2015: LM 85% sev cal, pLAD-1 lesion 80%, pLAD-2 lesion 100% chronic, ostLCx 70% sev cal, OM4 90%, ostRCA 70%, mid RCA 90%, LIMA-LAD patent, VG-OM2 patent, VG-OM3 patent, VG-RPDA patent. No AS  . GERD (gastroesophageal reflux disease)   . H/O echocardiogram    a. 10/2005 Echo: nl EF.  [name] Hairy cell leukemia ([place])   . HOH (hard of hearing)    Bilateral hearing aids  . HTN (hypertension)   . Hyperlipidemia   . Hypertension   . Leukemia [facility])     Past Surgical History:  Procedure Laterality Date  . CARDIAC CATHETERIZATION N/A 05/26/2015   Procedure: Left Heart Cath;  Surgeon: [name], MD;  Location: [place] [facility];  Service: Cardiovascular;  Laterality: N/A;  . CARDIAC CATHETERIZATION N/A 05/26/2015   Procedure: Coronary/Graft Angiography;  Surgeon: [name], MD;  Location: [place] [facility];  Service: Cardiovascular;  Laterality: N/A;  . CATARACT EXTRACTION    . CORONARY ARTERY BYPASS GRAFT  2000  . EYE SURGERY Left    Cataract Extraction with IOL  . INGUINAL HERNIA REPAIR Left 06/24/2016   Procedure: HERNIA REPAIR INGUINAL ADULT;  Surgeon: [name], MD;  Location: [facility];  Service: General;  Laterality: Left;  . TONSILLECTOMY  1942   . UPPER GASTROINTESTINAL ENDOSCOPY  3 years ago    with Dilation.     Family History  Problem Relation Age of Onset  . Hypertension Mother   . Arthritis Father   . Other      no premature CAD.  [name] Colon cancer Neg Hx   . Prostate cancer Neg Hx   . Stomach cancer Neg Hx     Social History Social History  Substance Use Topics  . Smoking status: Never Smoker  . Smokeless tobacco: Never Used  . Alcohol use No    No Known Allergies  Current Outpatient Prescriptions  Medication Sig Dispense Refill  . aspirin 81 MG EC tablet Take 2 tablets (162 mg total) by mouth daily. 30 tablet 0  . atorvastatin (LIPITOR) 40 MG tablet Take 0.5 tablets (20 mg total) by mouth daily. 45 tablet 3  . Calcium Carbonate Antacid (TUMS CHEWY BITES PO) Take 1 tablet by mouth as needed.     . isosorbide mononitrate (IMDUR) 30 MG 24 hr tablet Take 1 tablet (30 mg total) by mouth daily. 90 tablet 3  . Multiple Vitamin (MULTIVITAMIN) tablet Take 1 tablet by mouth daily.      [name]  prochlorperazine (COMPAZINE) 10 MG tablet TAKE 1 TABLET (10 MG TOTAL) BY MOUTH EVERY 6 (SIX) HOURS AS NEEDED FOR NAUSEA OR VOMITING. 30 tablet 0  . RANEXA 500 MG 12 hr tablet Take 1 tablet by mouth 2 (two) times daily.    . ranitidine (ZANTAC) 150 MG tablet Take 150 mg by mouth 2 (two) times daily. Reported on 12/25/2015    . solifenacin (VESICARE) 5 MG tablet Take 2 tablets (10 mg total) by mouth daily. 30 tablet 0  . tamsulosin (FLOMAX) 0.4 MG CAPS capsule TAKE 1 CAPSULE (0.4 MG TOTAL) BY MOUTH DAILY. 30 capsule 3   No current facility-administered medications for this visit.    Facility-Administered Medications Ordered in Other Visits  Medication Dose Route Frequency Provider Last Rate Last Dose  . sodium chloride 0.9 % injection 10 mL  10 mL Intracatheter PRN [name], MD      . sodium chloride 0.9 % injection 10 mL  10 mL  Intracatheter PRN [name], MD        Review of Systems Review of Systems  Constitutional: Negative.   Respiratory: Negative.   Cardiovascular: Negative.     Blood pressure 132/76, pulse 62, resp. rate 12, height 5\' 8"  (1.727 m), weight 154 lb (69.9 kg).  Physical Exam Physical Exam  Constitutional: He is oriented to person, place, and time. He appears well-developed and well-nourished.  Genitourinary:     Neurological: He is alert and oriented to person, place, and time.  Skin: Skin is warm and dry.       Assessment    Doing well status post left inguinal hernia repair.    Plan    The patient may increase his activities is comfortable. He was encouraged use good judgment with lifting.  Follow-up will be on an as-needed basis.       [name] 07/01/2016, 8:59 PM

## 2016-07-12 ENCOUNTER — Ambulatory Visit: Payer: Medicare Other | Admitting: Urology

## 2016-07-12 ENCOUNTER — Encounter: Payer: Self-pay | Admitting: Urology

## 2016-07-12 VITALS — BP 125/71 | HR 65 | Ht 67.0 in | Wt 154.3 lb

## 2016-07-12 DIAGNOSIS — R351 Nocturia: Secondary | ICD-10-CM | POA: Diagnosis not present

## 2016-07-12 DIAGNOSIS — N400 Enlarged prostate without lower urinary tract symptoms: Secondary | ICD-10-CM | POA: Diagnosis not present

## 2016-07-12 MED ORDER — SOLIFENACIN SUCCINATE 10 MG PO TABS: 10.0000 mg | ORAL_TABLET | Freq: Every day | ORAL | 11 refills | Status: DC

## 2016-07-12 MED ORDER — TAMSULOSIN HCL 0.4 MG PO CAPS: 0.4000 mg | ORAL_CAPSULE | Freq: Every day | ORAL | 3 refills | Status: DC

## 2016-07-12 NOTE — Progress Notes (Signed)
07/12/2016 1:30 PM   Frank Mcdaniel 1933-08-03 [id number]  Referring provider: [name], MD [address] [place], [place] [address]  Chief Complaint  Patient presents with  . Follow-up    Nocturia     HPI: For many months the patient has mildly worsening urgency and frequency. He has urge incontinence not every day and does not wear a pad. This actually has improved some. He voids every 1-2 hours and hourly at night. He has a cardiac history. His flow is reasonable  The patient was bothered by his intermittent urge incontinence and frequency. I added the beta 3 agonist to his Flomax. The possibility of stopping the Flomax in the future was discussed. He had been on it for years and it was difficult to say how much it was helping  The patient did have a positive culture recently. I was suspected he may be chronically colonized. He can get up 10 times at night.  The patient has significant nighttime frequency but does much better during the day. I will continue to keep him on Flomax. I gave him Vesicare 5 mg samples and prescription and Toviaz 4 mg samples and prescription reevaluate in 2 months. He likely has a nocturnal diuresis with a cardiac history as well. I will discuss percutaneous tibial nerve stimulation next visit if the medications have not helped   Nighttime frequency is 4 or 5 and not 9 or 10. The [place] worked better. I will see the patient in 4 weeks on Vesicare 10 mg and either in combination or as a monotherapy recommend percutaneous tibial nerve stimulation depend upon the results  Today Frequency is stable. Clinically noninfected. He still gets up 4-5 times at night. He is on Flomax and Vesicare 10 mg  I talked about percutaneous tibial nerve stimulation with my usual template. He preferred not to do this treatment  Both prescriptions were renewed and I will see him in one year   PMH: Past Medical History:  Diagnosis Date  .  Anemia 09/01/2015  . Anxiety 09/11/2015  . Arthritis   . Asthma    as a teenager  . Asymptomatic Sinus Bradycardia   . Atypical chest pain    a. 08/2014  . BPH (benign prostatic hyperplasia)   . CKD (chronic kidney disease), stage III   . Coronary artery disease    a. 2000 s/p CABG;  b. 10/2008 Neg MV, EF 64%; c. cath 05/2015: LM 85% sev cal, pLAD-1 lesion 80%, pLAD-2 lesion 100% chronic, ostLCx 70% sev cal, OM4 90%, ostRCA 70%, mid RCA 90%, LIMA-LAD patent, VG-OM2 patent, VG-OM3 patent, VG-RPDA patent. No AS  . GERD (gastroesophageal reflux disease)   . H/O echocardiogram    a. 10/2005 Echo: nl EF.  [name] Hairy cell leukemia ([place])   . HOH (hard of hearing)    Bilateral hearing aids  . HTN (hypertension)   . Hyperlipidemia   . Hypertension   . Leukemia [facility])     Surgical History: Past Surgical History:  Procedure Laterality Date  . CARDIAC CATHETERIZATION N/A 05/26/2015   Procedure: Left Heart Cath;  Surgeon: [name], MD;  Location: [place] [facility];  Service: Cardiovascular;  Laterality: N/A;  . CARDIAC CATHETERIZATION N/A 05/26/2015   Procedure: Coronary/Graft Angiography;  Surgeon: [name], MD;  Location: [place] [facility];  Service: Cardiovascular;  Laterality: N/A;  . CATARACT EXTRACTION    . CORONARY ARTERY BYPASS GRAFT  2000  . EYE SURGERY Left  Cataract Extraction with IOL  . INGUINAL HERNIA REPAIR Left 06/24/2016   Procedure: HERNIA REPAIR INGUINAL ADULT;  Surgeon: [name], MD;  Location: [facility];  Service: General;  Laterality: Left;  . TONSILLECTOMY  1942   . UPPER GASTROINTESTINAL ENDOSCOPY  3 years ago    with Dilation.     Home Medications:    Medication List       Accurate as of 07/12/16  1:30 PM. Always use your most recent med list.          aspirin 81 MG EC tablet Take 2 tablets (162 mg total) by mouth daily.   atorvastatin 40 MG tablet Commonly known as:  LIPITOR Take 0.5 tablets (20 mg total) by mouth daily.     isosorbide mononitrate 30 MG 24 hr tablet Commonly known as:  IMDUR Take 1 tablet (30 mg total) by mouth daily.   multivitamin tablet Take 1 tablet by mouth daily.   prochlorperazine 10 MG tablet Commonly known as:  COMPAZINE TAKE 1 TABLET (10 MG TOTAL) BY MOUTH EVERY 6 (SIX) HOURS AS NEEDED FOR NAUSEA OR VOMITING.   RANEXA 500 MG 12 hr tablet Generic drug:  ranolazine Take 1 tablet by mouth 2 (two) times daily.   ranitidine 150 MG tablet Commonly known as:  ZANTAC Take 150 mg by mouth 2 (two) times daily. Reported on 12/25/2015   solifenacin 5 MG tablet Commonly known as:  VESICARE Take 2 tablets (10 mg total) by mouth daily.   tamsulosin 0.4 MG Caps capsule Commonly known as:  FLOMAX TAKE 1 CAPSULE (0.4 MG TOTAL) BY MOUTH DAILY.   TUMS CHEWY BITES PO Take 1 tablet by mouth as needed.       Allergies: No Known Allergies  Family History: Family History  Problem Relation Age of Onset  . Hypertension Mother   . Arthritis Father   . Other      no premature CAD.  [name] Colon cancer Neg Hx   . Prostate cancer Neg Hx   . Stomach cancer Neg Hx     Social History:  reports that he has never smoked. He has never used smokeless tobacco. He reports that he does not drink alcohol or use drugs.  ROS: UROLOGY Frequent Urination?: No Hard to postpone urination?: Yes Burning/pain with urination?: No Get up at night to urinate?: Yes Leakage of urine?: No Urine stream starts and stops?: No Trouble starting stream?: No Do you have to strain to urinate?: No Blood in urine?: No Urinary tract infection?: No Sexually transmitted disease?: No Injury to kidneys or bladder?: No Painful intercourse?: No Weak stream?: No Erection problems?: No Penile pain?: No  Gastrointestinal Nausea?: No Vomiting?: No Indigestion/heartburn?: No Diarrhea?: No Constipation?: No  Constitutional Fever: No Night sweats?: No Weight loss?: No Fatigue?: No  Skin Skin rash/lesions?:  No Itching?: No  Eyes Blurred vision?: No Double vision?: No  Ears/Nose/Throat Sore throat?: No Sinus problems?: No  Hematologic/Lymphatic Swollen glands?: No Easy bruising?: No  Cardiovascular Leg swelling?: No Chest pain?: No  Respiratory Cough?: No Shortness of breath?: No  Endocrine Excessive thirst?: No  Musculoskeletal Back pain?: No Joint pain?: No  Neurological Headaches?: No Dizziness?: No  Psychologic Depression?: No Anxiety?: No  Physical Exam:   Laboratory Data: Lab Results  Component Value Date   WBC 5.7 06/18/2016   HGB 13.3 06/18/2016   HCT 38.1 (L) 06/18/2016   MCV 92.5 06/18/2016   PLT 162 06/18/2016    Lab Results  Component Value  Date   CREATININE 1.29 (H) 04/18/2016    Lab Results  Component Value Date   PSA 2.21 12/21/2015    No results found for: TESTOSTERONE  No results found for: HGBA1C  Urinalysis    Component Value Date/Time   COLORURINE Yellow 09/01/2014 1142   APPEARANCEUR Clear 05/23/2016 1554   LABSPEC 1.013 09/01/2014 1142   PHURINE 5.0 09/01/2014 1142   GLUCOSEU Negative 05/23/2016 1554   GLUCOSEU Negative 09/01/2014 1142   HGBUR Negative 09/01/2014 1142   BILIRUBINUR Negative 05/23/2016 1554   BILIRUBINUR Negative 09/01/2014 1142   KETONESUR Negative 09/01/2014 1142   PROTEINUR Negative 05/23/2016 1554   PROTEINUR Negative 09/01/2014 1142   UROBILINOGEN 0.2 12/21/2015 1132   NITRITE Negative 05/23/2016 1554   NITRITE Negative 09/01/2014 1142   LEUKOCYTESUR Negative 05/23/2016 1554   LEUKOCYTESUR Negative 09/01/2014 1142    Pertinent Imaging: none  Assessment & Plan:  Reassess patient for lower urinary tract symptoms and nighttime frequency in 1 year  There are no diagnoses linked to this encounter.  No Follow-up on file.  Reece Packer, MD  [facility] [address], [place], [place] [address] [phone]

## 2016-07-14 NOTE — Progress Notes (Signed)
[name]  [phone] [phone]  ID: Frank Mcdaniel OB: September 08, 1932  MR#: [id number]  EUM#:[id number]  Patient Care Team: [name], MD as PCP - General (Family Medicine) [name], MD as Consulting Physician (Cardiology) [name], MD as Consulting Physician (Oncology) Nori Riis, PA-C as Physician Assistant (Urology)  CHIEF COMPLAINT: Hairy cell leukemia in complete remission.  INTERVAL HISTORY: Patient returns to clinic today for repeat laboratory work and further evaluation. Patient states he felt back to his usual state of health but is still experiencing weakness and fatigue. He otherwise feels well.  He has no neurologic complaints. He denies any fevers. He continues to have occasional stomach discomfort which is relieved with Tums. He denies nausea, vomiting, diarrhea or constipation. He denies any chest pain or shortness of breath. He denies any weight loss. He has no urinary complaints. Patient offers no further specific complaints today.   REVIEW OF SYSTEMS:   Review of Systems  Constitutional: Positive for malaise/fatigue. Negative for fever.  Respiratory: Negative.  Negative for cough and shortness of breath.   Cardiovascular: Negative for chest pain.  Gastrointestinal: Negative for nausea and vomiting.  Genitourinary: Negative.   Musculoskeletal: Positive for back pain.  Neurological: Negative.   Psychiatric/Behavioral: Negative.     As per HPI. Otherwise, a complete review of systems is negative.  PAST MEDICAL HISTORY: Past Medical History:  Diagnosis Date  . Anemia 09/01/2015  . Anxiety 09/11/2015  . Arthritis   . Asthma    as a teenager  . Asymptomatic Sinus Bradycardia   . Atypical chest pain    a. 08/2014  . BPH (benign prostatic hyperplasia)   . CKD (chronic kidney disease), stage III   . Coronary artery disease    a. 2000 s/p CABG;  b. 10/2008 Neg MV, EF 64%; c. cath 05/2015: LM 85%  sev cal, pLAD-1 lesion 80%, pLAD-2 lesion 100% chronic, ostLCx 70% sev cal, OM4 90%, ostRCA 70%, mid RCA 90%, LIMA-LAD patent, VG-OM2 patent, VG-OM3 patent, VG-RPDA patent. No AS  . GERD (gastroesophageal reflux disease)   . H/O echocardiogram    a. 10/2005 Echo: nl EF.  Frank Mcdaniel Hairy cell leukemia ([place])   . HOH (hard of hearing)    Bilateral hearing aids  . HTN (hypertension)   . Hyperlipidemia   . Hypertension   . Leukemia ([place])     PAST SURGICAL HISTORY: Past Surgical History:  Procedure Laterality Date  . CARDIAC CATHETERIZATION N/A 05/26/2015   Procedure: Left Heart Cath;  Surgeon: [name], MD;  Location: [place] [facility];  Service: Cardiovascular;  Laterality: N/A;  . CARDIAC CATHETERIZATION N/A 05/26/2015   Procedure: Coronary/Graft Angiography;  Surgeon: [name], MD;  Location: [place] [facility];  Service: Cardiovascular;  Laterality: N/A;  . CATARACT EXTRACTION    . CORONARY ARTERY BYPASS GRAFT  2000  . EYE SURGERY Left    Cataract Extraction with IOL  . INGUINAL HERNIA REPAIR Left 06/24/2016   Procedure: HERNIA REPAIR INGUINAL ADULT;  Surgeon: [name], MD;  Location: [facility];  Service: General;  Laterality: Left;  . TONSILLECTOMY  1942   . UPPER GASTROINTESTINAL ENDOSCOPY  3 years ago    with Dilation.     FAMILY HISTORY Family History  Problem Relation Age of Onset  . Hypertension Mother   . Arthritis Father   . Other      no premature CAD.  Frank Mcdaniel Colon cancer Neg Hx   .  Prostate cancer Neg Hx   . Stomach cancer Neg Hx        ADVANCED DIRECTIVES:    HEALTH MAINTENANCE: Social History  Substance Use Topics  . Smoking status: Never Smoker  . Smokeless tobacco: Never Used  . Alcohol use No     No Known Allergies  Current Outpatient Prescriptions  Medication Sig Dispense Refill  . aspirin 81 MG EC tablet Take 2 tablets (162 mg total) by mouth daily. 30 tablet 0  . atorvastatin (LIPITOR) 40 MG tablet Take 0.5 tablets (20 mg  total) by mouth daily. 45 tablet 3  . Calcium Carbonate Antacid (TUMS CHEWY BITES PO) Take 1 tablet by mouth as needed.     . isosorbide mononitrate (IMDUR) 30 MG 24 hr tablet Take 1 tablet (30 mg total) by mouth daily. 90 tablet 3  . Multiple Vitamin (MULTIVITAMIN) tablet Take 1 tablet by mouth daily.      . prochlorperazine (COMPAZINE) 10 MG tablet TAKE 1 TABLET (10 MG TOTAL) BY MOUTH EVERY 6 (SIX) HOURS AS NEEDED FOR NAUSEA OR VOMITING. 30 tablet 0  . RANEXA 500 MG 12 hr tablet Take 1 tablet by mouth 2 (two) times daily.    . ranitidine (ZANTAC) 150 MG tablet Take 150 mg by mouth 2 (two) times daily. Reported on 12/25/2015    . solifenacin (VESICARE) 10 MG tablet Take 10 mg by mouth daily.    . tamsulosin (FLOMAX) 0.4 MG CAPS capsule TAKE 1 CAPSULE (0.4 MG TOTAL) BY MOUTH DAILY. 30 capsule 3   No current facility-administered medications for this visit.    Facility-Administered Medications Ordered in Other Visits  Medication Dose Route Frequency Provider Last Rate Last Dose  . sodium chloride 0.9 % injection 10 mL  10 mL Intracatheter PRN [name], MD      . sodium chloride 0.9 % injection 10 mL  10 mL Intracatheter PRN [name], MD        OBJECTIVE: Vitals:   07/15/16 1531  BP: 127/68  Pulse: 71  Resp: 18  Temp: 98.7 F (37.1 C)     Body mass index is 24.12 kg/m.    ECOG FS:1 - Symptomatic but completely ambulatory  General: Well-developed, well-nourished, no acute distress. Eyes: anicteric sclera. Lungs: Clear to auscultation bilaterally. Heart: Regular rate and rhythm. No rubs, murmurs, or gallops. Abdomen: Soft, nontender, nondistended. No organomegaly noted, normoactive bowel sounds. Musculoskeletal: No edema, cyanosis, or clubbing. Neuro: Alert, answering all questions appropriately. Cranial nerves grossly intact. Skin: No rashes or petechiae noted. Psych: Normal affect.   LAB RESULTS:  Lab Results  Component Value Date   NA 139 07/15/2016   K  4.0 07/15/2016   CL 106 07/15/2016   CO2 30 07/15/2016   GLUCOSE 105 (H) 07/15/2016   BUN 18 07/15/2016   CREATININE 1.36 (H) 07/15/2016   CALCIUM 9.1 07/15/2016   PROT 6.7 07/15/2016   ALBUMIN 4.0 07/15/2016   AST 23 07/15/2016   ALT 10 (L) 07/15/2016   ALKPHOS 60 07/15/2016   BILITOT 0.6 07/15/2016   GFRNONAA 47 (L) 07/15/2016   GFRAA 55 (L) 07/15/2016    Lab Results  Component Value Date   WBC 6.4 07/15/2016   NEUTROABS 4.5 07/15/2016   HGB 11.3 (L) 07/15/2016   HCT 33.5 (L) 07/15/2016   MCV 92.6 07/15/2016   PLT 227 07/15/2016     STUDIES: No results found.  ASSESSMENT:  Hairy cell leukemia in complete remission.  PLAN:   1. Hairy  cell leukemia in complete remission: Bone marrow biopsy in May 2017 revealed no evidence of disease. Patient initially received cladribine in 2006 and then was treated for a relapse in 2011 and again in 2014. Bone marrow biopsy at that time confirmed recurrence of disease with 80-90% hairy cell leukemia noted in his specimen. Patient completed cladribine 0.15 mg/kg on days 1 through 5 along with 8 weekly cycles of Rituxan on April 18, 2015. Bone marrow biopsy results after completion of 8 weeks of Rituxan without evidence of residual disease. No intervention is needed at this time. Return to clinic in 3 months with repeat laboratory work and six months for labs and further evaluation. 2. Thrombocytopenia: Resolved.  Patient had normal trilineage hematopoiesis in his bone marrow.  3. Weakness and fatigue: Resolved. Saw urologist and is currently taking a new medication to help with nocturia. This is helping him sleep at night.  4: GERD: Continue with OTC Tums.   Patient expressed understanding and was in agreement with this plan. He also understands that He can call clinic at any time with any questions, concerns, or complaints.   Faythe Casa, NP 07/15/2016 4:30PM  Patient was seen and evaluated independently and I agree with the  assessment and plan as dictated above. Patient also understands that he will likely require repeat bone marrow biopsy if there is suspicion of recurrence.  [name], MD 07/17/16 10:03 AM   ROS

## 2016-07-15 ENCOUNTER — Other Ambulatory Visit: Payer: Self-pay

## 2016-07-15 ENCOUNTER — Inpatient Hospital Stay: Payer: Medicare Other

## 2016-07-15 ENCOUNTER — Inpatient Hospital Stay: Payer: Medicare Other | Attending: Oncology | Admitting: Oncology

## 2016-07-15 VITALS — BP 127/68 | HR 71 | Temp 98.7°F | Resp 18 | Wt 154.0 lb

## 2016-07-15 DIAGNOSIS — Z7982 Long term (current) use of aspirin: Secondary | ICD-10-CM | POA: Diagnosis not present

## 2016-07-15 DIAGNOSIS — K219 Gastro-esophageal reflux disease without esophagitis: Secondary | ICD-10-CM | POA: Diagnosis not present

## 2016-07-15 DIAGNOSIS — I129 Hypertensive chronic kidney disease with stage 1 through stage 4 chronic kidney disease, or unspecified chronic kidney disease: Secondary | ICD-10-CM | POA: Diagnosis not present

## 2016-07-15 DIAGNOSIS — Z79899 Other long term (current) drug therapy: Secondary | ICD-10-CM | POA: Insufficient documentation

## 2016-07-15 DIAGNOSIS — M549 Dorsalgia, unspecified: Secondary | ICD-10-CM | POA: Diagnosis not present

## 2016-07-15 DIAGNOSIS — R531 Weakness: Secondary | ICD-10-CM | POA: Insufficient documentation

## 2016-07-15 DIAGNOSIS — R5383 Other fatigue: Secondary | ICD-10-CM | POA: Insufficient documentation

## 2016-07-15 DIAGNOSIS — E785 Hyperlipidemia, unspecified: Secondary | ICD-10-CM | POA: Insufficient documentation

## 2016-07-15 DIAGNOSIS — I2510 Atherosclerotic heart disease of native coronary artery without angina pectoris: Secondary | ICD-10-CM | POA: Diagnosis not present

## 2016-07-15 DIAGNOSIS — C9141 Hairy cell leukemia, in remission: Secondary | ICD-10-CM | POA: Diagnosis present

## 2016-07-15 DIAGNOSIS — R5381 Other malaise: Secondary | ICD-10-CM | POA: Insufficient documentation

## 2016-07-15 DIAGNOSIS — N400 Enlarged prostate without lower urinary tract symptoms: Secondary | ICD-10-CM

## 2016-07-15 DIAGNOSIS — N183 Chronic kidney disease, stage 3 (moderate): Secondary | ICD-10-CM | POA: Diagnosis not present

## 2016-07-15 LAB — COMPREHENSIVE METABOLIC PANEL
ALBUMIN: 4 g/dL (ref 3.5–5.0)
ALK PHOS: 60 U/L (ref 38–126)
ALT: 10 U/L — AB (ref 17–63)
AST: 23 U/L (ref 15–41)
Anion gap: 3 — ABNORMAL LOW (ref 5–15)
BILIRUBIN TOTAL: 0.6 mg/dL (ref 0.3–1.2)
BUN: 18 mg/dL (ref 6–20)
CALCIUM: 9.1 mg/dL (ref 8.9–10.3)
CO2: 30 mmol/L (ref 22–32)
Chloride: 106 mmol/L (ref 101–111)
Creatinine, Ser: 1.36 mg/dL — ABNORMAL HIGH (ref 0.61–1.24)
GFR calc Af Amer: 55 mL/min — ABNORMAL LOW (ref >60)
GFR calc non Af Amer: 47 mL/min — ABNORMAL LOW (ref >60)
GLUCOSE: 105 mg/dL — AB (ref 65–99)
Potassium: 4 mmol/L (ref 3.5–5.1)
Sodium: 139 mmol/L (ref 135–145)
TOTAL PROTEIN: 6.7 g/dL (ref 6.5–8.1)

## 2016-07-15 LAB — CBC WITH DIFFERENTIAL/PLATELET
BASOS ABS: 0 10*3/uL (ref 0–0.1)
BASOS PCT: 0 %
Eosinophils Absolute: 0.3 10*3/uL (ref 0–0.7)
Eosinophils Relative: 4 %
HEMATOCRIT: 33.5 % — AB (ref 40.0–52.0)
HEMOGLOBIN: 11.3 g/dL — AB (ref 13.0–18.0)
Lymphocytes Relative: 12 %
Lymphs Abs: 0.8 10*3/uL — ABNORMAL LOW (ref 1.0–3.6)
MCH: 31.2 pg (ref 26.0–34.0)
MCHC: 33.7 g/dL (ref 32.0–36.0)
MCV: 92.6 fL (ref 80.0–100.0)
MONOS PCT: 13 %
Monocytes Absolute: 0.9 10*3/uL (ref 0.2–1.0)
NEUTROS ABS: 4.5 10*3/uL (ref 1.4–6.5)
NEUTROS PCT: 71 %
Platelets: 227 10*3/uL (ref 150–440)
RBC: 3.61 MIL/uL — AB (ref 4.40–5.90)
RDW: 14 % (ref 11.5–14.5)
WBC: 6.4 10*3/uL (ref 3.8–10.6)

## 2016-07-15 NOTE — Progress Notes (Signed)
Offers no complaints  

## 2016-07-19 ENCOUNTER — Ambulatory Visit: Payer: Medicare Other | Admitting: Cardiovascular Disease

## 2016-08-02 ENCOUNTER — Other Ambulatory Visit: Payer: Self-pay | Admitting: Nurse Practitioner

## 2016-08-10 IMAGING — DX DG CHEST 2V
2 series · 2 of 2 positions shown · non-contrast
Comparison: 09/01/2014 and earlier.

CLINICAL DATA: 82-year-old male with chest pain weakness and nausea
since last night. Initial encounter.

EXAM:
CHEST  2 VIEW

[chest pa]
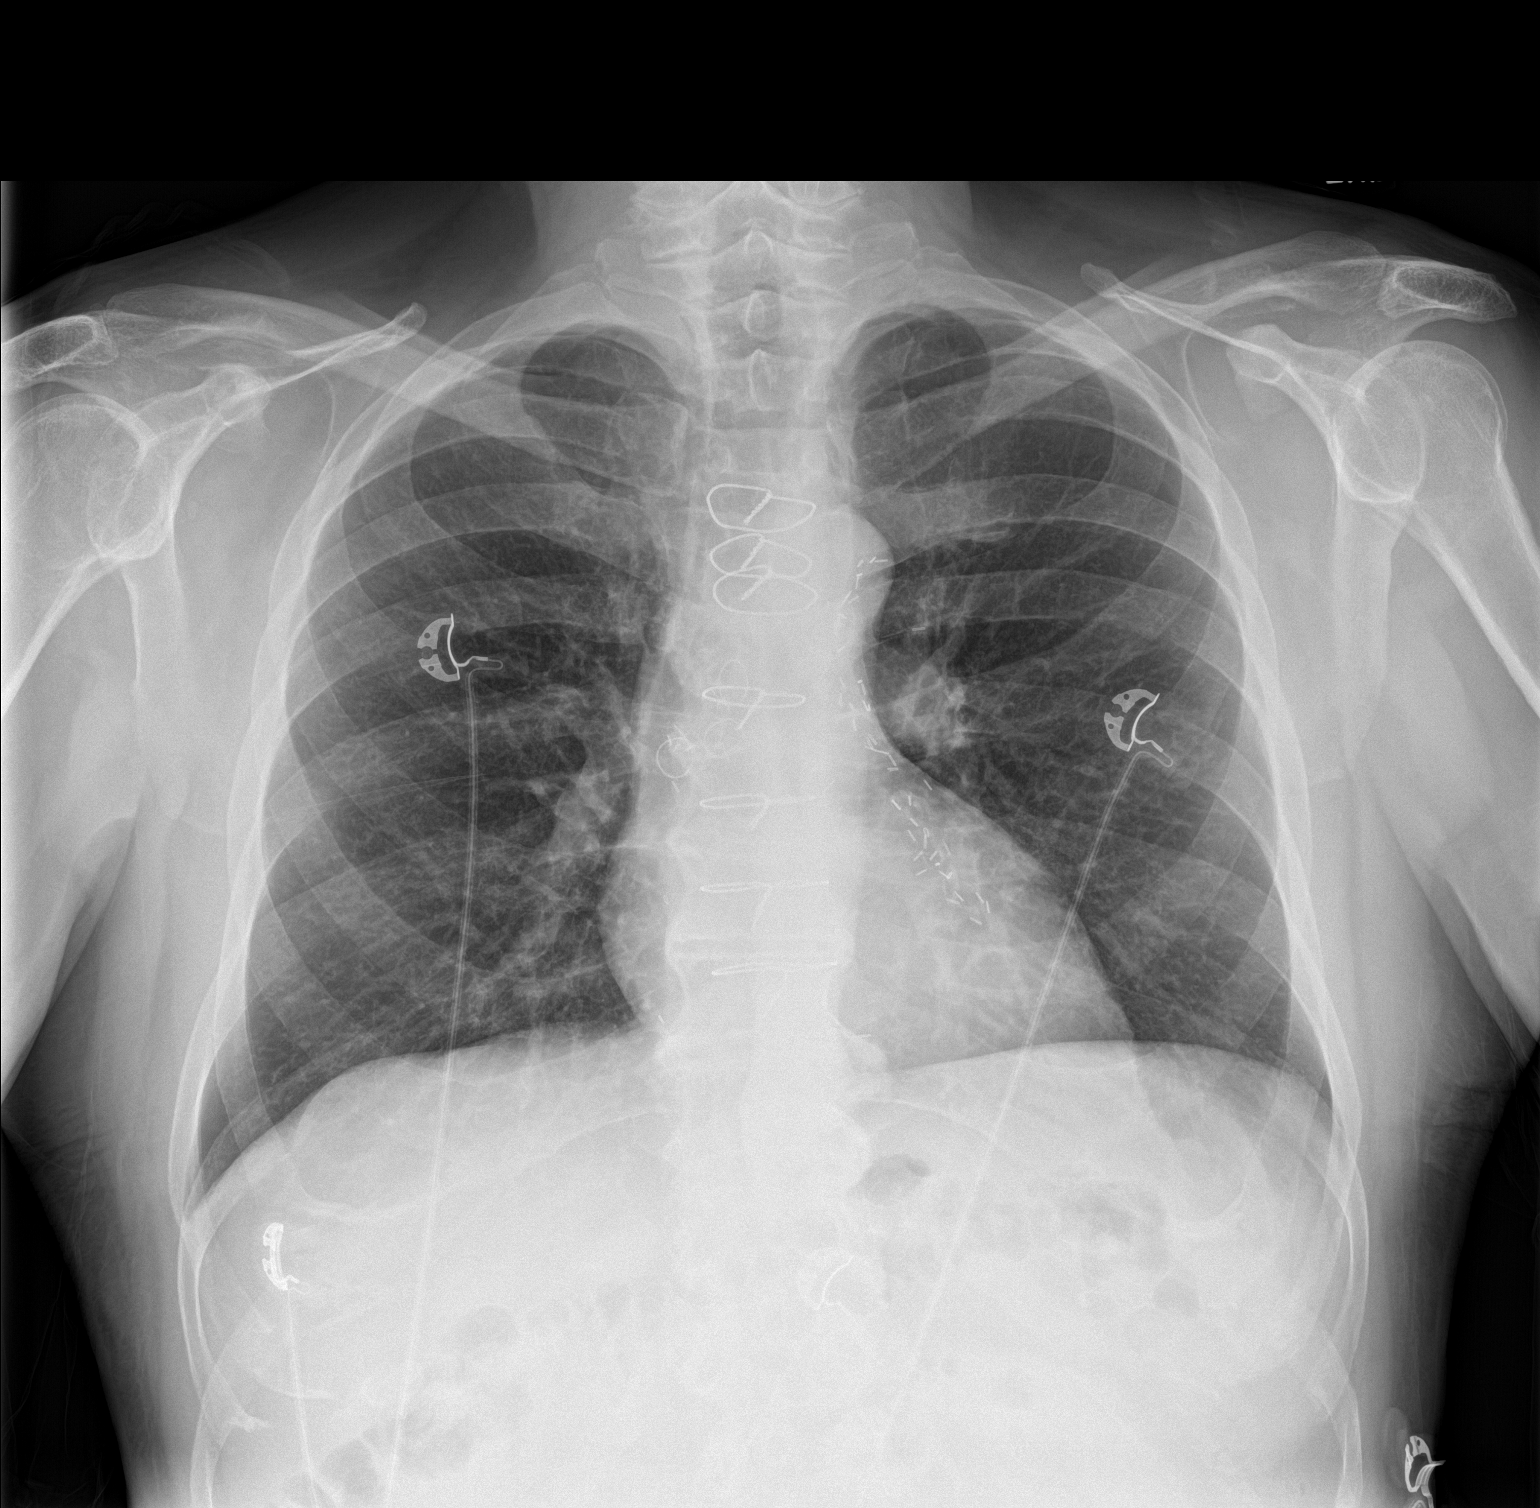

[chest lat]
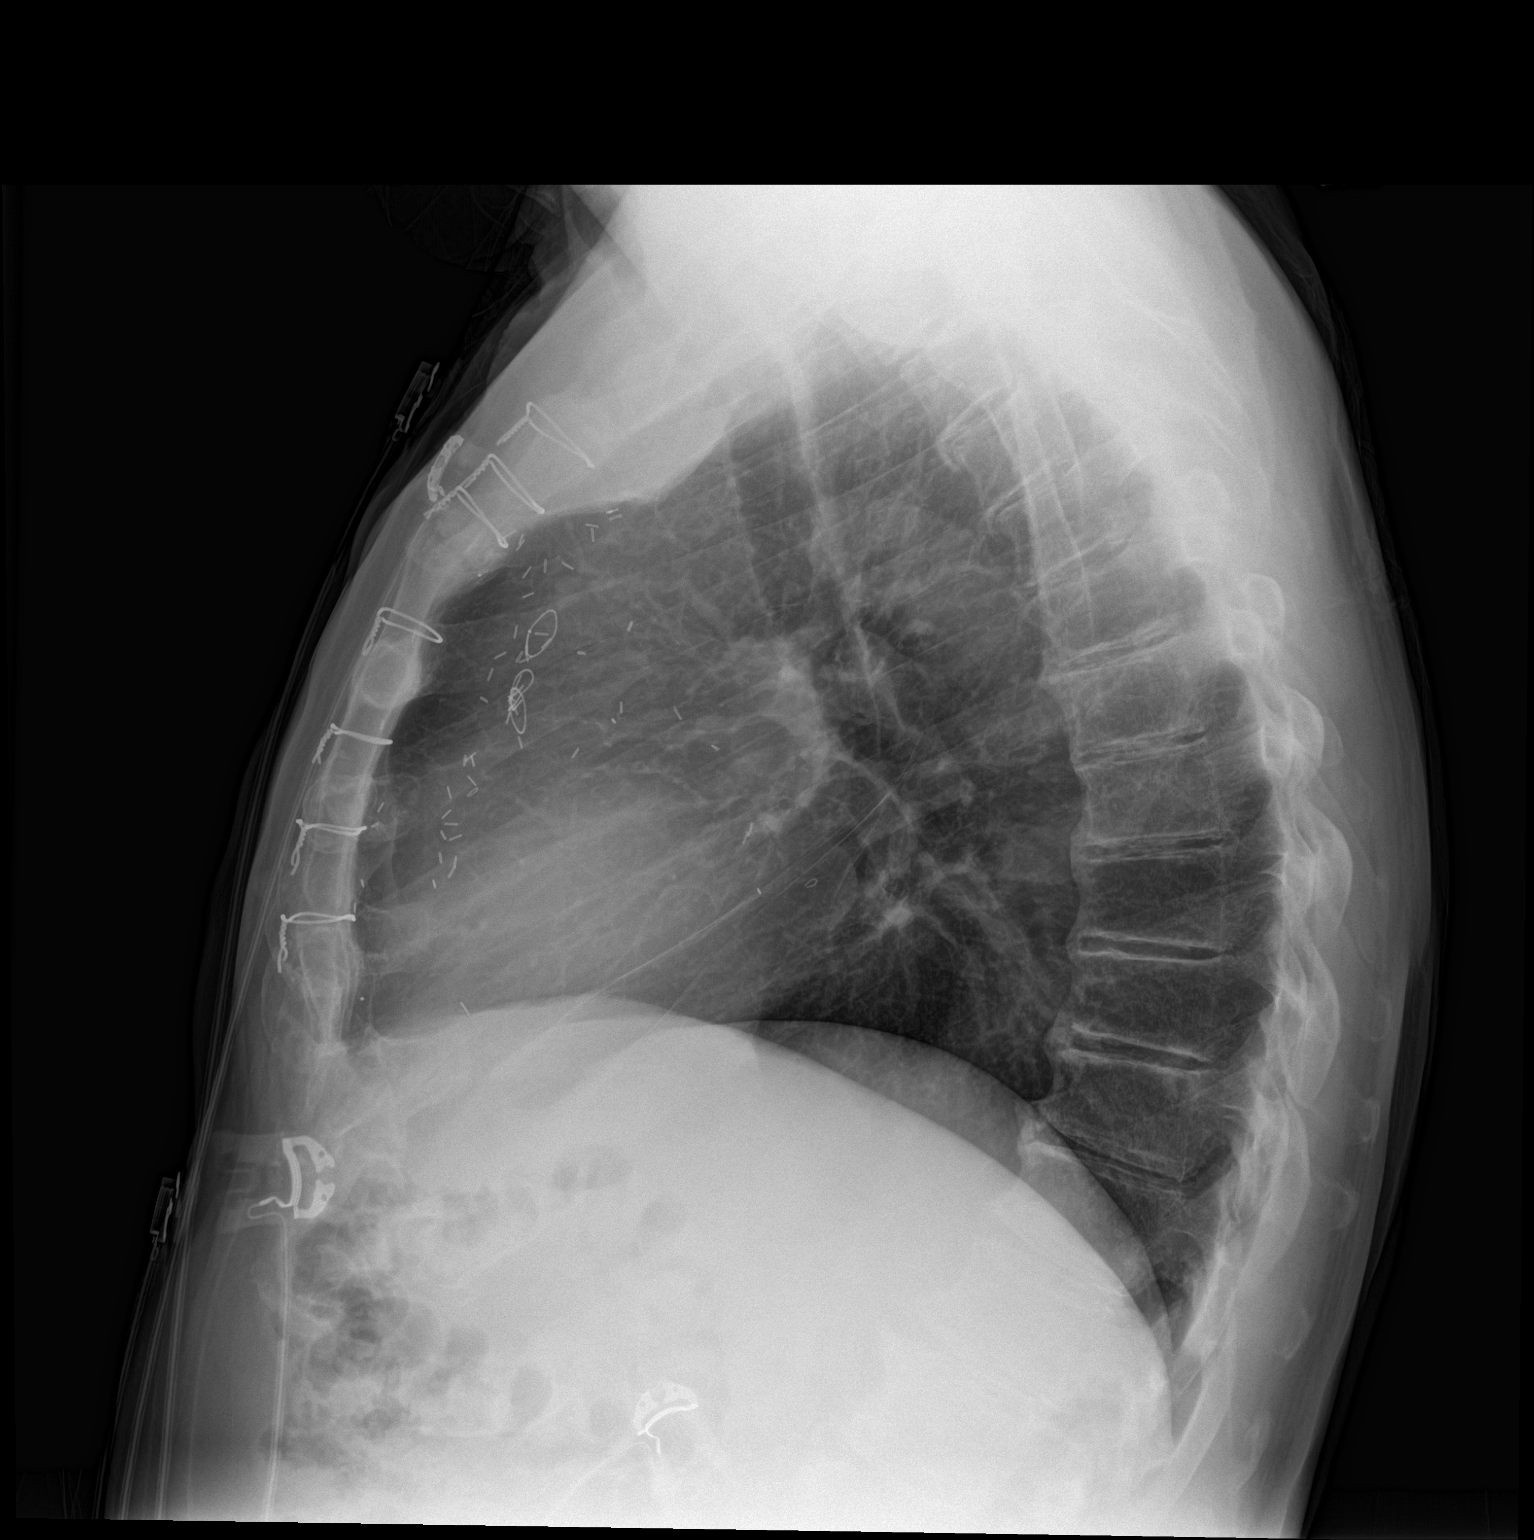

[2 of 2 positions shown; findings below may reference images not displayed]

FINDINGS: Stable sequelae of CABG. Normal cardiac size and mediastinal
contours. Lung volumes are stable and within normal limits. The
lungs remain clear. No pneumothorax or effusion. No acute osseous
abnormality identified.
IMPRESSION: No acute cardiopulmonary abnormality.

## 2016-08-13 ENCOUNTER — Ambulatory Visit (INDEPENDENT_AMBULATORY_CARE_PROVIDER_SITE_OTHER): Payer: Medicare Other | Admitting: Cardiovascular Disease

## 2016-08-13 ENCOUNTER — Encounter: Payer: Self-pay | Admitting: Cardiovascular Disease

## 2016-08-13 VITALS — BP 110/60 | HR 66

## 2016-08-13 DIAGNOSIS — I2510 Atherosclerotic heart disease of native coronary artery without angina pectoris: Secondary | ICD-10-CM

## 2016-08-13 DIAGNOSIS — I10 Essential (primary) hypertension: Secondary | ICD-10-CM

## 2016-08-13 DIAGNOSIS — Z951 Presence of aortocoronary bypass graft: Secondary | ICD-10-CM

## 2016-08-13 DIAGNOSIS — C9141 Hairy cell leukemia, in remission: Secondary | ICD-10-CM

## 2016-08-13 DIAGNOSIS — E7800 Pure hypercholesterolemia, unspecified: Secondary | ICD-10-CM

## 2016-08-13 DIAGNOSIS — I209 Angina pectoris, unspecified: Secondary | ICD-10-CM | POA: Diagnosis not present

## 2016-08-13 NOTE — Patient Instructions (Signed)

## 2016-08-13 NOTE — Progress Notes (Signed)
Cardiology Office Note  Date:  08/13/2016   ID:  Frank Mcdaniel, DOB 12/29/1932, MRN [id number]  PCP:  [name], MD   Chief Complaint  Patient presents with  . other    54mo f/u. Pt states he is doing well. Reviewed meds with pt verbally.    HPI:  83 year old gentleman with a history of coronary artery disease, bypass in July 2000, hairy cell leukemia,  chronic anemia, history of hyperlipidemia.who presents for routine followup of his coronary artery disease.  He has chronic lower extremity edema, worse on the right than the left. admission to the hospital June 2016 for shortness of breath, had a echocardiogram, stress test Showing no ischemia Diagnosed with asthma exacerbation Baseline hematocrit 31, periodic follow-up with Dr. [name]  In follow-up today he reports that he is doing well Lives with son, does the house chores Continues to go to the train station, helps with whatever needs to be done. Playing less of a role. Denies any chest pain or shortness of breath on exertion, no recent falls Previously had rare left-sided chest pain, started on ranexa has not had any further episodes of chest discomfort  He's been taking Lipitor 20 ng daily, cholesterol number went up to 142 from 85  EKG on today's visit shows normal sinus rhythm with rate 66 bpm, right bundle branch block  Other past medical history  slipping on the ice in January 2016, possibly landed on his left arm, had chest pressure, described as a dullness in his left pectoral area. He went to the emergency room and evaluation showed negative cardiac enzymes, otherwise normal lab work including CBC, basic metabolic panel, EKG unchanged, chest x-ray normal. He was discharged home with follow-up today Possibly had one or 2 very short episodes of fleeting low-grade almost in the left chest. Again he wonders if it could be musculoskeletal from recent fall  Last stress test 01/2015 was a lexiscan, ejection fraction  65%, no ischemia noted, no EKG changes    PMH:   has a past medical history of Anemia (09/01/2015); Anxiety (09/11/2015); Arthritis; Asthma; Asymptomatic Sinus Bradycardia; Atypical chest pain; BPH (benign prostatic hyperplasia); CKD (chronic kidney disease), stage III; Coronary artery disease; GERD (gastroesophageal reflux disease); H/O echocardiogram; Hairy cell leukemia ([place]); HOH (hard of hearing); HTN (hypertension); Hyperlipidemia; Hypertension; and Leukemia ([place]).  PSH:    Past Surgical History:  Procedure Laterality Date  . CARDIAC CATHETERIZATION N/A 05/26/2015   Procedure: Left Heart Cath;  Surgeon: [name], MD;  Location: [facility];  Service: Cardiovascular;  Laterality: N/A;  . CARDIAC CATHETERIZATION N/A 05/26/2015   Procedure: Coronary/Graft Angiography;  Surgeon: [name], MD;  Location: [facility];  Service: Cardiovascular;  Laterality: N/A;  . CATARACT EXTRACTION    . CORONARY ARTERY BYPASS GRAFT  2000  . EYE SURGERY Left    Cataract Extraction with IOL  . INGUINAL HERNIA REPAIR Left 06/24/2016   Procedure: HERNIA REPAIR INGUINAL ADULT;  Surgeon: [name], MD;  Location: [facility];  Service: General;  Laterality: Left;  . TONSILLECTOMY  1942   . UPPER GASTROINTESTINAL ENDOSCOPY  3 years ago    with Dilation.     Current Outpatient Prescriptions  Medication Sig Dispense Refill  . aspirin 81 MG EC tablet Take 2 tablets (162 mg total) by mouth daily. 30 tablet 0  . atorvastatin (LIPITOR) 40 MG tablet Take 0.5 tablets (20 mg total) by mouth daily. 45 tablet 3  . Calcium Carbonate  Antacid (TUMS CHEWY BITES PO) Take 1 tablet by mouth as needed.     . isosorbide mononitrate (IMDUR) 30 MG 24 hr tablet Take 1 tablet (30 mg total) by mouth daily. 90 tablet 3  . Multiple Vitamin (MULTIVITAMIN) tablet Take 1 tablet by mouth daily.      . prochlorperazine (COMPAZINE) 10 MG tablet TAKE 1 TABLET (10 MG TOTAL) BY MOUTH EVERY 6 (SIX) HOURS AS  NEEDED FOR NAUSEA OR VOMITING. 30 tablet 0  . RANEXA 500 MG 12 hr tablet Take 1 tablet by mouth 2 (two) times daily.    . ranitidine (ZANTAC) 150 MG tablet Take 150 mg by mouth 2 (two) times daily. Reported on 12/25/2015    . solifenacin (VESICARE) 10 MG tablet Take 10 mg by mouth daily.    . tamsulosin (FLOMAX) 0.4 MG CAPS capsule TAKE 1 CAPSULE (0.4 MG TOTAL) BY MOUTH DAILY. 30 capsule 3   No current facility-administered medications for this visit.      Allergies:   Patient has no known allergies.   Social History:  The patient  reports that he has never smoked. He has never used smokeless tobacco. He reports that he does not drink alcohol or use drugs.   Family History:   family history includes Arthritis in his father; Hypertension in his mother.    Review of Systems: Review of Systems  Constitutional: Negative.   Respiratory: Negative.   Cardiovascular: Negative.   Gastrointestinal: Negative.   Musculoskeletal: Negative.   Neurological: Negative.   Psychiatric/Behavioral: Negative.   All other systems reviewed and are negative.    PHYSICAL EXAM: VS:  BP 110/60 (BP Location: Left Arm, Patient Position: Sitting, Cuff Size: Normal)   Pulse 66  , BMI There is no height or weight on file to calculate BMI. GEN: Well nourished, well developed, in no acute distress  HEENT: normal  Neck: no JVD, carotid bruits, or masses Cardiac: RRR; no murmurs, rubs, or gallops,no edema  Respiratory:  clear to auscultation bilaterally, normal work of breathing GI: soft, nontender, nondistended, + BS MS: no deformity or atrophy  Skin: warm and dry, no rash Neuro:  Strength and sensation are intact Psych: euthymic mood, full affect    Recent Labs: 12/21/2015: TSH 0.74 04/10/2016: Magnesium 2.0 07/15/2016: ALT 10; BUN 18; Creatinine, Ser 1.36; Hemoglobin 11.3; Platelets 227; Potassium 4.0; Sodium 139    Lipid Panel Lab Results  Component Value Date   CHOL 142 05/24/2016   HDL 50.50  05/24/2016   LDLCALC 78 05/24/2016   TRIG 69.0 05/24/2016      Wt Readings from Last 3 Encounters:  07/15/16 153 lb 15.9 oz (69.8 kg)  07/12/16 154 lb 4.8 oz (70 kg)  07/01/16 154 lb (69.9 kg)       ASSESSMENT AND PLAN:  Pure hypercholesterolemia Encouraged him to watch his diet, stay on half dose Lipitor Unclear why his numbers doubled in the past year  Essential hypertension Blood pressure is well controlled on today's visit. No changes made to the medications.  Coronary artery disease involving native coronary artery of native heart without angina pectoris - Plan: EKG 12-Lead Currently with no symptoms of angina. No further workup at this time. Continue current medication regimen. Long discussion concerning the ranexa He feels that he will stay on it. If needed could wean off and see if chest discomfort recurs  Angina pectoris (HCC) Currently symptoms are stable, denies having any anginal symptoms  Hairy cell leukemia, in remission ([place]) Followed by Dr.  Finnegan, stable  S/P CABG (coronary artery bypass graft)  no further testing at this time. Active without anginal symptoms    Total encounter time more than 25 minutes  Greater than 50% was spent in counseling and coordination of care with the patient   Disposition:   F/U  6 months   Orders Placed This Encounter  Procedures  . EKG 12-Lead     Signed, Esmond Plants, M.D., Ph.D. 08/13/2016  [place], [place]

## 2016-08-21 ENCOUNTER — Ambulatory Visit: Payer: Medicare Other | Admitting: Family Medicine

## 2016-09-13 ENCOUNTER — Ambulatory Visit (INDEPENDENT_AMBULATORY_CARE_PROVIDER_SITE_OTHER): Payer: Medicare Other | Admitting: Family Medicine

## 2016-09-13 ENCOUNTER — Encounter: Payer: Self-pay | Admitting: Family Medicine

## 2016-09-13 DIAGNOSIS — Z8719 Personal history of other diseases of the digestive system: Secondary | ICD-10-CM | POA: Insufficient documentation

## 2016-09-13 DIAGNOSIS — Z9889 Other specified postprocedural states: Secondary | ICD-10-CM | POA: Diagnosis not present

## 2016-09-13 DIAGNOSIS — I2510 Atherosclerotic heart disease of native coronary artery without angina pectoris: Secondary | ICD-10-CM

## 2016-09-13 DIAGNOSIS — N400 Enlarged prostate without lower urinary tract symptoms: Secondary | ICD-10-CM

## 2016-09-13 DIAGNOSIS — K5900 Constipation, unspecified: Secondary | ICD-10-CM

## 2016-09-13 HISTORY — DX: Personal history of other diseases of the digestive system: Z87.19

## 2016-09-13 HISTORY — DX: Other specified postprocedural states: Z98.890

## 2016-09-13 NOTE — Assessment & Plan Note (Signed)
Quite a bit improved. He will continue his current medications.

## 2016-09-13 NOTE — Assessment & Plan Note (Signed)
Doing well status post repair. He has already followed up with surgery for postop. He'll continue to monitor.

## 2016-09-13 NOTE — Progress Notes (Signed)
  [name], MD Phone: [phone]  Frank Mcdaniel is a 83 y.o. male who presents today for follow-up.  BPH: Patient notes his nocturia is quite a bit improved. He is only urinating 2-3 times a night. Was 6-8 times a night previously. Urinates 4-5 times a day. No urinary flow issues. No issues with starting his urinary stream. He is taking Flomax and Vesicare.  CAD: No chest pain or shortness breath. Is taking Ranexa and Lipitor. Also on Imdur. Following with cardiology for this.  Constipation: Still has intermittent issues with this. Has a bowel movement every 1-2 days. They are hard little balls alternating with normal stools. No abdominal pain, nausea, or vomiting. No blood in his stool. Does have to strain occasionally. Uses MiraLAX though not on a regular basis. Has started to use docusate and senna as a backup.  Patient had a hernia repair. He notes no bulges or pain now. He had follow-up with surgery regarding this.  PMH: nonsmoker.   ROS see history of present illness  Objective  Physical Exam Vitals:   09/13/16 1451  BP: 132/70  Pulse: 62  Temp: 98.3 F (36.8 C)    BP Readings from Last 3 Encounters:  09/13/16 132/70  08/13/16 110/60  07/15/16 127/68   Wt Readings from Last 3 Encounters:  09/13/16 155 lb 9.6 oz (70.6 kg)  07/15/16 153 lb 15.9 oz (69.8 kg)  07/12/16 154 lb 4.8 oz (70 kg)    Physical Exam  Constitutional: No distress.  Cardiovascular: Normal rate, regular rhythm and normal heart sounds.   Pulmonary/Chest: Effort normal and breath sounds normal.  Abdominal: Soft. Bowel sounds are normal. He exhibits no distension. There is no tenderness. There is no rebound and no guarding.  Musculoskeletal: He exhibits no edema.  Neurological: He is alert. Gait normal.  Skin: Skin is warm and dry. He is not diaphoretic.     Assessment/Plan: Please see individual problem list.  Coronary artery disease Asymptomatic. Continue current medications.  Follow with cardiology.  Constipation Continues to have issues with this. Benign exam. Discussed taking MiraLAX every other day to see if this will be beneficial. Can use docusate and senna as a backup. If continues to have issues with this could consider GI referral.  BPH (benign prostatic hyperplasia) Quite a bit improved. He will continue his current medications.  Status post inguinal hernia repair Doing well status post repair. He has already followed up with surgery for postop. He'll continue to monitor.   [name], MD [place]

## 2016-09-13 NOTE — Patient Instructions (Signed)
Nice to see you. You can take the MiraLAX every other day to try to have good daily bowel movements. You may use the docusate/senna as a backup if you're having trouble with your bowel movements. If you develop abdominal pain, blood in your stool, or worsening constipation please seek medical attention.

## 2016-09-13 NOTE — Assessment & Plan Note (Signed)
Continues to have issues with this. Benign exam. Discussed taking MiraLAX every other day to see if this will be beneficial. Can use docusate and senna as a backup. If continues to have issues with this could consider GI referral.

## 2016-09-13 NOTE — Assessment & Plan Note (Signed)
Asymptomatic. Continue current medications. Follow with cardiology.

## 2016-09-13 NOTE — Progress Notes (Signed)
Pre visit review using our clinic review tool, if applicable. No additional management support is needed unless otherwise documented below in the visit note. 

## 2016-10-14 ENCOUNTER — Inpatient Hospital Stay: Payer: Medicare Other | Attending: Oncology | Admitting: *Deleted

## 2016-10-14 DIAGNOSIS — C9141 Hairy cell leukemia, in remission: Secondary | ICD-10-CM | POA: Insufficient documentation

## 2016-10-14 LAB — CBC WITH DIFFERENTIAL/PLATELET
BASOS ABS: 0 10*3/uL (ref 0–0.1)
Basophils Relative: 0 %
EOS PCT: 5 %
Eosinophils Absolute: 0.3 10*3/uL (ref 0–0.7)
HEMATOCRIT: 36.6 % — AB (ref 40.0–52.0)
Hemoglobin: 12.5 g/dL — ABNORMAL LOW (ref 13.0–18.0)
LYMPHS ABS: 0.4 10*3/uL — AB (ref 1.0–3.6)
LYMPHS PCT: 8 %
MCH: 32.1 pg (ref 26.0–34.0)
MCHC: 34.2 g/dL (ref 32.0–36.0)
MCV: 93.8 fL (ref 80.0–100.0)
Monocytes Absolute: 0.6 10*3/uL (ref 0.2–1.0)
Monocytes Relative: 11 %
Neutro Abs: 4.3 10*3/uL (ref 1.4–6.5)
Neutrophils Relative %: 76 %
PLATELETS: 160 10*3/uL (ref 150–440)
RBC: 3.9 MIL/uL — AB (ref 4.40–5.90)
RDW: 13.4 % (ref 11.5–14.5)
WBC: 5.6 10*3/uL (ref 3.8–10.6)

## 2016-10-14 LAB — COMPREHENSIVE METABOLIC PANEL
ALT: 11 U/L — AB (ref 17–63)
AST: 25 U/L (ref 15–41)
Albumin: 4.2 g/dL (ref 3.5–5.0)
Alkaline Phosphatase: 62 U/L (ref 38–126)
Anion gap: 5 (ref 5–15)
BILIRUBIN TOTAL: 0.5 mg/dL (ref 0.3–1.2)
BUN: 18 mg/dL (ref 6–20)
CHLORIDE: 106 mmol/L (ref 101–111)
CO2: 29 mmol/L (ref 22–32)
CREATININE: 1.41 mg/dL — AB (ref 0.61–1.24)
Calcium: 8.9 mg/dL (ref 8.9–10.3)
GFR, EST AFRICAN AMERICAN: 52 mL/min — AB (ref >60)
GFR, EST NON AFRICAN AMERICAN: 45 mL/min — AB (ref >60)
Glucose, Bld: 103 mg/dL — ABNORMAL HIGH (ref 65–99)
Potassium: 3.7 mmol/L (ref 3.5–5.1)
Sodium: 140 mmol/L (ref 135–145)
Total Protein: 6.8 g/dL (ref 6.5–8.1)

## 2016-10-26 ENCOUNTER — Other Ambulatory Visit: Payer: Self-pay | Admitting: Physician Assistant

## 2016-10-29 ENCOUNTER — Telehealth: Payer: Self-pay | Admitting: Physician Assistant

## 2016-10-29 NOTE — Telephone Encounter (Signed)
Pt states he really would like a call once sent this in   *STAT* If patient is at the pharmacy, call can be transferred to refill team.   1. Which medications need to be refilled? (please list name of each medication and dose if known) Ramexa   2. Which pharmacy/location (including street and city if local pharmacy) is medication to be sent to? [facility] [address]  3. Do they need a 30 day or 90 day supply? 90 day

## 2016-10-29 NOTE — Telephone Encounter (Signed)
Refill sent yesterday. Called pharmacy to verify they received refill; they did. LVM to make pt aware.

## 2016-11-14 ENCOUNTER — Other Ambulatory Visit: Payer: Self-pay | Admitting: Family Medicine

## 2016-12-20 ENCOUNTER — Ambulatory Visit: Payer: Medicare Other | Admitting: Family Medicine

## 2016-12-24 ENCOUNTER — Ambulatory Visit (INDEPENDENT_AMBULATORY_CARE_PROVIDER_SITE_OTHER): Payer: Medicare Other | Admitting: Family Medicine

## 2016-12-24 ENCOUNTER — Encounter: Payer: Self-pay | Admitting: Family Medicine

## 2016-12-24 ENCOUNTER — Other Ambulatory Visit: Payer: Self-pay | Admitting: Cardiovascular Disease

## 2016-12-24 DIAGNOSIS — N400 Enlarged prostate without lower urinary tract symptoms: Secondary | ICD-10-CM

## 2016-12-24 DIAGNOSIS — K5900 Constipation, unspecified: Secondary | ICD-10-CM

## 2016-12-24 DIAGNOSIS — R413 Other amnesia: Secondary | ICD-10-CM | POA: Diagnosis not present

## 2016-12-24 NOTE — Assessment & Plan Note (Signed)
Improved. Continue as needed MiraLAX. If worsens he'll let us know.

## 2016-12-24 NOTE — Assessment & Plan Note (Signed)
Significantly improved on Flomax and Vesicare. He'll continue to monitor. If worsens he will let us or his urologist know.

## 2016-12-24 NOTE — Patient Instructions (Signed)
Nice to see you. I am glad your BPH is better. Please keep your appointment with urology. Please monitor your constipation and if this worsens please let us know.  Please continue to monitor your memory issues and if they worsen please let us know.

## 2016-12-24 NOTE — Assessment & Plan Note (Signed)
Memory is stable. MMSE previously 30 out of 30. They'll continue to monitor.

## 2016-12-24 NOTE — Progress Notes (Signed)
  [name], MD Phone: [phone]  Frank Mcdaniel is a 83 y.o. male who presents today for f/u.  Constipation: Having a bowel movement 1-2 times a day now. They're light brown. Normal bowel movements. He had been having more issues constipation and he took MiraLAX 3 days in a row and this has stabilized. No blood. He is passing gas. No abdominal pain.  Patient continues to note some short-term memory issues. No long-term memory issues. He does still drive. Has not gotten lost. He has no ADL/IADL issues. Notes his memory is unchanged. MMSE late last year was 30 out of 30.  BPH: Currently on Flomax and Vesicare. This is working quite well for him. He is getting up 2-3 times a night. Does start and stop occasionally in the morning. No straining. He does feel as though he empties his bladder fully.  PMH: nonsmoker.   ROS see history of present illness  Objective  Physical Exam Vitals:   12/24/16 1357  BP: 100/62  Pulse: (!) 59  Temp: 98.6 F (37 C)    BP Readings from Last 3 Encounters:  12/24/16 100/62  09/13/16 132/70  08/13/16 110/60   Wt Readings from Last 3 Encounters:  12/24/16 155 lb 12.8 oz (70.7 kg)  09/13/16 155 lb 9.6 oz (70.6 kg)  07/15/16 153 lb 15.9 oz (69.8 kg)    Physical Exam  Constitutional: No distress.  Cardiovascular: Normal rate, regular rhythm and normal heart sounds.   Pulmonary/Chest: Effort normal and breath sounds normal.  Abdominal: Soft. Bowel sounds are normal. He exhibits no distension. There is no tenderness. There is no rebound and no guarding.  Musculoskeletal: He exhibits no edema.  Neurological: He is alert.  Skin: Skin is warm and dry. He is not diaphoretic.     Assessment/Plan: Please see individual problem list.  Constipation Improved. Continue as needed MiraLAX. If worsens he'll let us know.  BPH (benign prostatic hyperplasia) Significantly improved on Flomax and Vesicare. He'll continue to monitor. If worsens he  will let us or his urologist know.  Memory difficulty Memory is stable. MMSE previously 30 out of 30. They'll continue to monitor.   No orders of the defined types were placed in this encounter.   Meds ordered this encounter  Medications  . polyethylene glycol (MIRALAX / GLYCOLAX) packet    Sig: Take 17 g by mouth daily.   [name], MD [place]

## 2017-01-13 ENCOUNTER — Other Ambulatory Visit: Payer: Medicare Other

## 2017-01-13 ENCOUNTER — Ambulatory Visit: Payer: Medicare Other | Admitting: Oncology

## 2017-01-18 NOTE — Progress Notes (Signed)
Frank Mcdaniel  [phone] [phone]  ID: Frank Mcdaniel OB: 05-03-1933  MR#: [id number]  MNO#:[id number]  Patient Care Team: [name], MD as PCP - General (Family Medicine) [name], MD as Consulting Physician (Cardiology) [name], MD as Consulting Physician (Oncology) [name] as Physician Assistant (Urology)  CHIEF COMPLAINT: Hairy cell leukemia in complete remission.  INTERVAL HISTORY: Patient returns to clinic today for repeat laboratory work and further evaluation. He currently feels well and is asymptomatic. He does not complain of weakness or fatigue today.  He has no neurologic complaints. He denies any fevers. He continues to have occasional stomach discomfort which is relieved with Tums. He denies nausea, vomiting, diarrhea or constipation. He denies any chest pain or shortness of breath. He denies any weight loss. He has no urinary complaints. Patient offers no further specific complaints today.   REVIEW OF SYSTEMS:   Review of Systems  Constitutional: Negative for fever, malaise/fatigue and weight loss.  Respiratory: Negative.  Negative for cough and shortness of breath.   Cardiovascular: Negative for chest pain and leg swelling.  Gastrointestinal: Positive for heartburn. Negative for abdominal pain, nausea and vomiting.  Genitourinary: Negative.   Musculoskeletal: Positive for back pain.  Skin: Negative.   Neurological: Negative.  Negative for sensory change and weakness.  Psychiatric/Behavioral: Negative.  The patient is not nervous/anxious.     As per HPI. Otherwise, a complete review of systems is negative.  PAST MEDICAL HISTORY: Past Medical History:  Diagnosis Date  . Anemia 09/01/2015  . Anxiety 09/11/2015  . Arthritis   . Asthma    as a teenager  . Asymptomatic Sinus Bradycardia   . Atypical chest pain    a. 08/2014  . BPH (benign prostatic hyperplasia)   . CKD (chronic  kidney disease), stage III   . Coronary artery disease    a. 2000 s/p CABG;  b. 10/2008 Neg MV, EF 64%; c. cath 05/2015: LM 85% sev cal, pLAD-1 lesion 80%, pLAD-2 lesion 100% chronic, ostLCx 70% sev cal, OM4 90%, ostRCA 70%, mid RCA 90%, LIMA-LAD patent, VG-OM2 patent, VG-OM3 patent, VG-RPDA patent. No AS  . GERD (gastroesophageal reflux disease)   . H/O echocardiogram    a. 10/2005 Echo: nl EF.  [name] Hairy cell leukemia ([place])   . HOH (hard of hearing)    Bilateral hearing aids  . HTN (hypertension)   . Hyperlipidemia   . Hypertension   . Leukemia ([place])     PAST SURGICAL HISTORY: Past Surgical History:  Procedure Laterality Date  . CARDIAC CATHETERIZATION N/A 05/26/2015   Procedure: Left Heart Cath;  Surgeon: [name], MD;  Location: [facility];  Service: Cardiovascular;  Laterality: N/A;  . CARDIAC CATHETERIZATION N/A 05/26/2015   Procedure: Coronary/Graft Angiography;  Surgeon: [name], MD;  Location: [facility];  Service: Cardiovascular;  Laterality: N/A;  . CATARACT EXTRACTION    . CORONARY ARTERY BYPASS GRAFT  2000  . EYE SURGERY Left    Cataract Extraction with IOL  . INGUINAL HERNIA REPAIR Left 06/24/2016   Procedure: HERNIA REPAIR INGUINAL ADULT;  Surgeon: [name], MD;  Location: [facility];  Service: General;  Laterality: Left;  . TONSILLECTOMY  1942   . UPPER GASTROINTESTINAL ENDOSCOPY  3 years ago    with Dilation.     FAMILY HISTORY Family History  Problem Relation Age of Onset  . Hypertension Mother   . Arthritis Father   .  Other Unknown        no premature CAD.  [name] Colon cancer Neg Hx   . Prostate cancer Neg Hx   . Stomach cancer Neg Hx        ADVANCED DIRECTIVES:    HEALTH MAINTENANCE: Social History  Substance Use Topics  . Smoking status: Never Smoker  . Smokeless tobacco: Never Used  . Alcohol use No     No Known Allergies  Current Outpatient Prescriptions  Medication Sig Dispense Refill  . aspirin  81 MG EC tablet Take 2 tablets (162 mg total) by mouth daily. 30 tablet 0  . atorvastatin (LIPITOR) 40 MG tablet TAKE 1/2 TABLETS (20 MG TOTAL) BY MOUTH DAILY. 45 tablet 3  . Calcium Carbonate Antacid (TUMS CHEWY BITES PO) Take 1 tablet by mouth as needed.     . isosorbide mononitrate (IMDUR) 30 MG 24 hr tablet Take 1 tablet (30 mg total) by mouth daily. 90 tablet 3  . Multiple Vitamin (MULTIVITAMIN) tablet Take 1 tablet by mouth daily.      . polyethylene glycol (MIRALAX / GLYCOLAX) packet Take 17 g by mouth daily.    [name] RANEXA 500 MG 12 hr tablet TAKE 1 TABLET BY MOUTH TWICE A DAY 60 tablet 3  . ranitidine (ZANTAC) 150 MG tablet Take 150 mg by mouth 2 (two) times daily. Reported on 12/25/2015    . solifenacin (VESICARE) 10 MG tablet Take 10 mg by mouth daily.    . tamsulosin (FLOMAX) 0.4 MG CAPS capsule TAKE 1 CAPSULE (0.4 MG TOTAL) BY MOUTH DAILY. 30 capsule 3  . prochlorperazine (COMPAZINE) 10 MG tablet TAKE 1 TABLET (10 MG TOTAL) BY MOUTH EVERY 6 (SIX) HOURS AS NEEDED FOR NAUSEA OR VOMITING. (Patient not taking: Reported on 01/20/2017) 30 tablet 0   No current facility-administered medications for this visit.     OBJECTIVE: There were no vitals filed for this visit.   There is no height or weight on file to calculate BMI.    ECOG FS:1 - Symptomatic but completely ambulatory  General: Well-developed, well-nourished, no acute distress. Eyes: anicteric sclera. Lungs: Clear to auscultation bilaterally. Heart: Regular rate and rhythm. No rubs, murmurs, or gallops. Abdomen: Soft, nontender, nondistended. No organomegaly noted, normoactive bowel sounds. Musculoskeletal: No edema, cyanosis, or clubbing. Neuro: Alert, answering all questions appropriately. Cranial nerves grossly intact. Skin: No rashes or petechiae noted. Psych: Normal affect.   LAB RESULTS:  Lab Results  Component Value Date   NA 137 01/20/2017   K 3.9 01/20/2017   CL 103 01/20/2017   CO2 27 01/20/2017   GLUCOSE 104  (H) 01/20/2017   BUN 18 01/20/2017   CREATININE 1.63 (H) 01/20/2017   CALCIUM 9.0 01/20/2017   PROT 6.7 01/20/2017   ALBUMIN 4.1 01/20/2017   AST 22 01/20/2017   ALT 10 (L) 01/20/2017   ALKPHOS 55 01/20/2017   BILITOT 0.8 01/20/2017   GFRNONAA 38 (L) 01/20/2017   GFRAA 44 (L) 01/20/2017    Lab Results  Component Value Date   WBC 5.5 01/20/2017   NEUTROABS 3.9 01/20/2017   HGB 12.6 (L) 01/20/2017   HCT 36.1 (L) 01/20/2017   MCV 93.7 01/20/2017   PLT 157 01/20/2017     STUDIES: No results found.  ASSESSMENT:  Hairy cell leukemia in complete remission.  PLAN:   1. Hairy cell leukemia in complete remission: Bone marrow biopsy in May 2017 revealed no evidence of disease. Patient initially received cladribine in 2006 and then was treated for a  relapse in 2011 and again in 2014. Bone marrow biopsy at that time confirmed recurrence of disease with 80-90% hairy cell leukemia noted in his specimen. Patient completed cladribine 0.15 mg/kg on days 1 through 5 along with 8 weekly cycles of Rituxan on April 18, 2015. Bone marrow biopsy results after completion of 8 weeks of Rituxan without evidence of residual disease. No intervention is needed at this time. If there were suspicion of recurrence, patient will require a bone marrow biopsy to confirm. Return to clinic in 3 months with repeat laboratory work and six months for labs and further evaluation. 2. Thrombocytopenia: Resolved.  Patient had normal trilineage hematopoiesis in his bone marrow.  3. Weakness and fatigue: Resolved.  4. GERD: Continue with OTC Tums.   Patient expressed understanding and was in agreement with this plan. He also understands that He can call clinic at any time with any questions, concerns, or complaints.    [name], MD 01/21/17 11:00 AM   ROS

## 2017-01-20 ENCOUNTER — Inpatient Hospital Stay: Payer: Medicare Other

## 2017-01-20 ENCOUNTER — Inpatient Hospital Stay: Payer: Medicare Other | Attending: Oncology | Admitting: Oncology

## 2017-01-20 DIAGNOSIS — I129 Hypertensive chronic kidney disease with stage 1 through stage 4 chronic kidney disease, or unspecified chronic kidney disease: Secondary | ICD-10-CM | POA: Insufficient documentation

## 2017-01-20 DIAGNOSIS — Z7982 Long term (current) use of aspirin: Secondary | ICD-10-CM

## 2017-01-20 DIAGNOSIS — Z9221 Personal history of antineoplastic chemotherapy: Secondary | ICD-10-CM

## 2017-01-20 DIAGNOSIS — K219 Gastro-esophageal reflux disease without esophagitis: Secondary | ICD-10-CM | POA: Insufficient documentation

## 2017-01-20 DIAGNOSIS — M549 Dorsalgia, unspecified: Secondary | ICD-10-CM | POA: Diagnosis not present

## 2017-01-20 DIAGNOSIS — R12 Heartburn: Secondary | ICD-10-CM | POA: Diagnosis not present

## 2017-01-20 DIAGNOSIS — Z79899 Other long term (current) drug therapy: Secondary | ICD-10-CM | POA: Diagnosis not present

## 2017-01-20 DIAGNOSIS — Z951 Presence of aortocoronary bypass graft: Secondary | ICD-10-CM

## 2017-01-20 DIAGNOSIS — E785 Hyperlipidemia, unspecified: Secondary | ICD-10-CM | POA: Insufficient documentation

## 2017-01-20 DIAGNOSIS — N400 Enlarged prostate without lower urinary tract symptoms: Secondary | ICD-10-CM | POA: Diagnosis not present

## 2017-01-20 DIAGNOSIS — C9141 Hairy cell leukemia, in remission: Secondary | ICD-10-CM | POA: Insufficient documentation

## 2017-01-20 DIAGNOSIS — N183 Chronic kidney disease, stage 3 (moderate): Secondary | ICD-10-CM

## 2017-01-20 DIAGNOSIS — I2510 Atherosclerotic heart disease of native coronary artery without angina pectoris: Secondary | ICD-10-CM | POA: Insufficient documentation

## 2017-01-20 LAB — CBC WITH DIFFERENTIAL/PLATELET
BASOS ABS: 0 10*3/uL (ref 0–0.1)
Basophils Relative: 0 %
EOS PCT: 4 %
Eosinophils Absolute: 0.2 10*3/uL (ref 0–0.7)
HEMATOCRIT: 36.1 % — AB (ref 40.0–52.0)
Hemoglobin: 12.6 g/dL — ABNORMAL LOW (ref 13.0–18.0)
LYMPHS ABS: 0.7 10*3/uL — AB (ref 1.0–3.6)
Lymphocytes Relative: 13 %
MCH: 32.6 pg (ref 26.0–34.0)
MCHC: 34.8 g/dL (ref 32.0–36.0)
MCV: 93.7 fL (ref 80.0–100.0)
MONO ABS: 0.6 10*3/uL (ref 0.2–1.0)
MONOS PCT: 12 %
NEUTROS ABS: 3.9 10*3/uL (ref 1.4–6.5)
Neutrophils Relative %: 71 %
Platelets: 157 10*3/uL (ref 150–440)
RBC: 3.86 MIL/uL — ABNORMAL LOW (ref 4.40–5.90)
RDW: 13.1 % (ref 11.5–14.5)
WBC: 5.5 10*3/uL (ref 3.8–10.6)

## 2017-01-20 LAB — COMPREHENSIVE METABOLIC PANEL
ALT: 10 U/L — ABNORMAL LOW (ref 17–63)
AST: 22 U/L (ref 15–41)
Albumin: 4.1 g/dL (ref 3.5–5.0)
Alkaline Phosphatase: 55 U/L (ref 38–126)
Anion gap: 7 (ref 5–15)
BILIRUBIN TOTAL: 0.8 mg/dL (ref 0.3–1.2)
BUN: 18 mg/dL (ref 6–20)
CO2: 27 mmol/L (ref 22–32)
Calcium: 9 mg/dL (ref 8.9–10.3)
Chloride: 103 mmol/L (ref 101–111)
Creatinine, Ser: 1.63 mg/dL — ABNORMAL HIGH (ref 0.61–1.24)
GFR calc Af Amer: 44 mL/min — ABNORMAL LOW (ref >60)
GFR, EST NON AFRICAN AMERICAN: 38 mL/min — AB (ref >60)
Glucose, Bld: 104 mg/dL — ABNORMAL HIGH (ref 65–99)
POTASSIUM: 3.9 mmol/L (ref 3.5–5.1)
Sodium: 137 mmol/L (ref 135–145)
TOTAL PROTEIN: 6.7 g/dL (ref 6.5–8.1)

## 2017-01-20 NOTE — Progress Notes (Signed)
Patient here today for follow up.  Patient c/o constipation, relived by mira lax.

## 2017-01-31 ENCOUNTER — Ambulatory Visit (INDEPENDENT_AMBULATORY_CARE_PROVIDER_SITE_OTHER): Payer: Medicare Other | Admitting: Family Medicine

## 2017-01-31 DIAGNOSIS — K5900 Constipation, unspecified: Secondary | ICD-10-CM | POA: Diagnosis not present

## 2017-01-31 NOTE — Patient Instructions (Addendum)
Nice to see you. Please trial daily fiber supplementation such as Metamucil. Try this for 1-2 weeks and if not working started on daily MiraLAX. If you develop abdominal pain nausea, vomiting, or blood in your stool please seek medical attention.

## 2017-01-31 NOTE — Assessment & Plan Note (Signed)
Stable from prior. He will have good bowel movements when he takes MiraLAX. When he comes off the MiraLAX he goes back to being constipated. Suspect medication related with Vesicare likely contributing. Encouraged fiber supplementation. If this is not beneficial he can go on MiraLAX daily. Given return precautions.

## 2017-01-31 NOTE — Progress Notes (Signed)
  [name], MD Phone: [phone]  Frank Mcdaniel is a 83 y.o. male who presents today for follow-up.  Patient reports continued intermittent issues with constipation. Had a bowel movement this morning. He is passing gas. No nausea, vomiting, or abdominal pain. No blood in his stool. Notes he will have small pieces of stool come out that are brown in color. Other times they are small balls. He'll take MiraLAX for several days in a row and then abnormal bowel movements. Has a bowel movement typically every 1-3 days. He has not tried fiber supplementation. He notes all the symptoms started after going on ranexa and Vesicare.  ROS see history of present illness  Objective  Physical Exam Vitals:   01/31/17 1307  BP: 140/78  Pulse: (!) 58  Temp: 98.4 F (36.9 C)    BP Readings from Last 3 Encounters:  01/31/17 140/78  12/24/16 100/62  09/13/16 132/70   Wt Readings from Last 3 Encounters:  01/31/17 153 lb 6.4 oz (69.6 kg)  12/24/16 155 lb 12.8 oz (70.7 kg)  09/13/16 155 lb 9.6 oz (70.6 kg)    Physical Exam  Constitutional: No distress.  Cardiovascular: Normal rate, regular rhythm and normal heart sounds.   Pulmonary/Chest: Effort normal and breath sounds normal.  Abdominal: Soft. Bowel sounds are normal. He exhibits no distension. There is no tenderness. There is no rebound and no guarding.  Skin: He is not diaphoretic.     Assessment/Plan: Please see individual problem list.  Constipation Stable from prior. He will have good bowel movements when he takes MiraLAX. When he comes off the MiraLAX he goes back to being constipated. Suspect medication related with Vesicare likely contributing. Encouraged fiber supplementation. If this is not beneficial he can go on MiraLAX daily. Given return precautions.  [name], MD [place]

## 2017-02-18 ENCOUNTER — Encounter
Admission: EM | Disposition: A | Discharge status: Home / Self Care | Payer: Self-pay | Source: Home / Self Care | Attending: Emergency Medicine

## 2017-02-18 ENCOUNTER — Emergency Department
Admission: EM | Admit: 2017-02-18 | Discharge: 2017-02-18 | Disposition: A | Discharge status: Home / Self Care | Payer: Medicare Other | Attending: Emergency Medicine | Admitting: Emergency Medicine

## 2017-02-18 ENCOUNTER — Encounter: Payer: Self-pay | Admitting: Emergency Medicine

## 2017-02-18 ENCOUNTER — Emergency Department: Payer: Medicare Other | Admitting: Anesthesiology

## 2017-02-18 DIAGNOSIS — T18108A Unspecified foreign body in esophagus causing other injury, initial encounter: Secondary | ICD-10-CM

## 2017-02-18 DIAGNOSIS — Z79899 Other long term (current) drug therapy: Secondary | ICD-10-CM | POA: Diagnosis not present

## 2017-02-18 DIAGNOSIS — F419 Anxiety disorder, unspecified: Secondary | ICD-10-CM | POA: Diagnosis not present

## 2017-02-18 DIAGNOSIS — N183 Chronic kidney disease, stage 3 (moderate): Secondary | ICD-10-CM | POA: Diagnosis not present

## 2017-02-18 DIAGNOSIS — K222 Esophageal obstruction: Secondary | ICD-10-CM | POA: Diagnosis not present

## 2017-02-18 DIAGNOSIS — E785 Hyperlipidemia, unspecified: Secondary | ICD-10-CM | POA: Insufficient documentation

## 2017-02-18 DIAGNOSIS — Z7982 Long term (current) use of aspirin: Secondary | ICD-10-CM | POA: Insufficient documentation

## 2017-02-18 DIAGNOSIS — I129 Hypertensive chronic kidney disease with stage 1 through stage 4 chronic kidney disease, or unspecified chronic kidney disease: Secondary | ICD-10-CM | POA: Diagnosis not present

## 2017-02-18 DIAGNOSIS — Z951 Presence of aortocoronary bypass graft: Secondary | ICD-10-CM | POA: Diagnosis not present

## 2017-02-18 DIAGNOSIS — D649 Anemia, unspecified: Secondary | ICD-10-CM | POA: Diagnosis not present

## 2017-02-18 DIAGNOSIS — X58XXXA Exposure to other specified factors, initial encounter: Secondary | ICD-10-CM | POA: Diagnosis not present

## 2017-02-18 DIAGNOSIS — T18128A Food in esophagus causing other injury, initial encounter: Secondary | ICD-10-CM | POA: Insufficient documentation

## 2017-02-18 DIAGNOSIS — N400 Enlarged prostate without lower urinary tract symptoms: Secondary | ICD-10-CM | POA: Insufficient documentation

## 2017-02-18 DIAGNOSIS — Y929 Unspecified place or not applicable: Secondary | ICD-10-CM | POA: Diagnosis not present

## 2017-02-18 DIAGNOSIS — W44F3XA Food entering into or through a natural orifice, initial encounter: Secondary | ICD-10-CM

## 2017-02-18 DIAGNOSIS — I2510 Atherosclerotic heart disease of native coronary artery without angina pectoris: Secondary | ICD-10-CM | POA: Insufficient documentation

## 2017-02-18 DIAGNOSIS — K219 Gastro-esophageal reflux disease without esophagitis: Secondary | ICD-10-CM | POA: Insufficient documentation

## 2017-02-18 HISTORY — PX: ESOPHAGOGASTRODUODENOSCOPY: SHX5428

## 2017-02-18 LAB — CBC WITH DIFFERENTIAL/PLATELET
BASOS PCT: 0 %
Basophils Absolute: 0 10*3/uL (ref 0–0.1)
EOS ABS: 0.4 10*3/uL (ref 0–0.7)
Eosinophils Relative: 5 %
HCT: 37.8 % — ABNORMAL LOW (ref 40.0–52.0)
Hemoglobin: 12.9 g/dL — ABNORMAL LOW (ref 13.0–18.0)
Lymphocytes Relative: 8 %
Lymphs Abs: 0.6 10*3/uL — ABNORMAL LOW (ref 1.0–3.6)
MCH: 31.8 pg (ref 26.0–34.0)
MCHC: 34.1 g/dL (ref 32.0–36.0)
MCV: 93.3 fL (ref 80.0–100.0)
MONO ABS: 0.8 10*3/uL (ref 0.2–1.0)
MONOS PCT: 11 %
Neutro Abs: 5.4 10*3/uL (ref 1.4–6.5)
Neutrophils Relative %: 76 %
PLATELETS: 153 10*3/uL (ref 150–440)
RBC: 4.06 MIL/uL — ABNORMAL LOW (ref 4.40–5.90)
RDW: 13.4 % (ref 11.5–14.5)
WBC: 7.2 10*3/uL (ref 3.8–10.6)

## 2017-02-18 LAB — COMPREHENSIVE METABOLIC PANEL
ALBUMIN: 4.2 g/dL (ref 3.5–5.0)
ALT: 10 U/L — ABNORMAL LOW (ref 17–63)
ANION GAP: 7 (ref 5–15)
AST: 21 U/L (ref 15–41)
Alkaline Phosphatase: 55 U/L (ref 38–126)
BUN: 21 mg/dL — ABNORMAL HIGH (ref 6–20)
CHLORIDE: 105 mmol/L (ref 101–111)
CO2: 26 mmol/L (ref 22–32)
Calcium: 9.2 mg/dL (ref 8.9–10.3)
Creatinine, Ser: 1.38 mg/dL — ABNORMAL HIGH (ref 0.61–1.24)
GFR calc Af Amer: 53 mL/min — ABNORMAL LOW (ref >60)
GFR calc non Af Amer: 46 mL/min — ABNORMAL LOW (ref >60)
GLUCOSE: 99 mg/dL (ref 65–99)
POTASSIUM: 3.7 mmol/L (ref 3.5–5.1)
SODIUM: 138 mmol/L (ref 135–145)
TOTAL PROTEIN: 6.7 g/dL (ref 6.5–8.1)
Total Bilirubin: 0.7 mg/dL (ref 0.3–1.2)

## 2017-02-18 SURGERY — EGD (ESOPHAGOGASTRODUODENOSCOPY)
Anesthesia: General | Site: Esophagus | Wound class: Clean Contaminated

## 2017-02-18 SURGERY — EGD (ESOPHAGOGASTRODUODENOSCOPY)
Anesthesia: Choice

## 2017-02-18 MED ORDER — SUCCINYLCHOLINE CHLORIDE 20 MG/ML IJ SOLN
INTRAMUSCULAR | Status: DC | PRN
  Administered 2017-02-18: 100 mg via INTRAVENOUS

## 2017-02-18 MED ORDER — GLUCAGON HCL (RDNA) 1 MG IJ SOLR: 1.0000 mg | Freq: Once | INTRAMUSCULAR | Status: DC

## 2017-02-18 MED ORDER — ONDANSETRON HCL 4 MG/2ML IJ SOLN: 4.0000 mg | Freq: Once | INTRAMUSCULAR | Status: DC | PRN

## 2017-02-18 MED ORDER — PROPOFOL 10 MG/ML IV BOLUS
INTRAVENOUS | Status: DC | PRN
  Administered 2017-02-18: 100 mg via INTRAVENOUS

## 2017-02-18 MED ORDER — PROPOFOL 10 MG/ML IV BOLUS
INTRAVENOUS | Status: AC
  Filled 2017-02-18: qty 20

## 2017-02-18 MED ORDER — LIDOCAINE HCL (CARDIAC) 20 MG/ML IV SOLN
INTRAVENOUS | Status: DC | PRN
  Administered 2017-02-18: 30 mg via INTRAVENOUS

## 2017-02-18 MED ORDER — LACTATED RINGERS IV SOLN
INTRAVENOUS | Status: DC | PRN
  Administered 2017-02-18: 22:00:00 via INTRAVENOUS

## 2017-02-18 MED ORDER — FENTANYL CITRATE (PF) 100 MCG/2ML IJ SOLN: 25.0000 ug | INTRAMUSCULAR | Status: DC | PRN

## 2017-02-18 MED ORDER — LIDOCAINE HCL (PF) 2 % IJ SOLN
INTRAMUSCULAR | Status: AC
  Filled 2017-02-18: qty 2

## 2017-02-18 NOTE — Transfer of Care (Signed)
Immediate Anesthesia Transfer of Care Note  Patient: Frank Mcdaniel  Procedure(s) Performed: Procedure(s): ESOPHAGOGASTRODUODENOSCOPY (EGD) with removal of food bolus (N/A)  Patient Location: PACU  Anesthesia Type:General  Level of Consciousness: awake, alert , oriented and patient cooperative  Airway & Oxygen Therapy: Patient Spontanous Breathing and Patient connected to face mask oxygen  Post-op Assessment: Report given to RN and Post -op Vital signs reviewed and stable  Post vital signs: Reviewed and stable  Last Vitals:  Vitals:   02/18/17 1911 02/18/17 2242  BP: (!) 186/70 (!) 146/51  Pulse: (!) 58 63  Resp: 17 14  Temp: 36.8 C (!) 36.4 C    Last Pain:  Vitals:   02/18/17 1911  TempSrc: Oral         Complications: No apparent anesthesia complications

## 2017-02-18 NOTE — Op Note (Signed)
[facility] Patient Name: Frank Mcdaniel Procedure Date: 02/18/2017 9:03 PM MRN: [id number] Account #: [id number] Date of Birth: 1933-06-18 Admit Type: Outpatient Age: 83 Room: [facility] ENDO ROOM 4 Gender: Male Note Status: Finalized Procedure:            Upper GI endoscopy Indications:          Foreign body in the esophagus Providers:            [name] MD, MD Medicines:            General Anesthesia Complications:        No immediate complications. Procedure:            Pre-Anesthesia Assessment:                       - Prior to the procedure, a History and Physical was                        performed, and patient medications and allergies were                        reviewed. The patient's tolerance of previous                        anesthesia was also reviewed. The risks and benefits of                        the procedure and the sedation options and risks were                        discussed with the patient. All questions were                        answered, and informed consent was obtained. Prior                        Anticoagulants: The patient has taken no previous                        anticoagulant or antiplatelet agents. ASA Grade                        Assessment: III - A patient with severe systemic                        disease. After reviewing the risks and benefits, the                        patient was deemed in satisfactory condition to undergo                        the procedure.                       After obtaining informed consent, the endoscope was                        passed under direct vision. Throughout the procedure,                        the patient's blood  pressure, pulse, and oxygen                        saturations were monitored continuously. The Endoscope                        was introduced through the mouth, and advanced to the                        second part of duodenum. The upper GI  endoscopy was                        accomplished without difficulty. The patient tolerated                        the procedure well. Findings:      One severe benign-appearing, intrinsic stenosis was found at the       gastroesophageal junction. And was traversed.      Food was found at the gastroesophageal junction. Removal of food was       accomplished.      The stomach was normal.      The examined duodenum was normal. Impression:           - Benign-appearing esophageal stenosis.                       - Food at the gastroesophageal junction. Removal was                        successful.                       - Normal stomach.                       - Normal examined duodenum. Recommendation:       - Discharge patient to home.                       - Mechanical soft diet.                       - Continue present medications.                       - Repeat upper endoscopy in 3 weeks for retreatment. Procedure Code(s):    --- Professional ---                       [id number], Esophagogastroduodenoscopy, flexible, transoral;                        with removal of foreign body(s) Diagnosis Code(s):    --- Professional ---                       T18.108A, Unspecified foreign body in esophagus causing                        other injury, initial encounter                       K22.2, Esophageal obstruction CPT copyright 2016 American Medical Association. All rights reserved. The codes documented in  this report are preliminary and upon coder review may  be revised to meet current compliance requirements. [name] MD, MD 02/18/2017 10:31:30 PM This report has been signed electronically. Number of Addenda: 0 Note Initiated On: 02/18/2017 9:03 PM      [facility]

## 2017-02-18 NOTE — Anesthesia Post-op Follow-up Note (Cosign Needed)
Anesthesia QCDR form completed.        

## 2017-02-18 NOTE — ED Provider Notes (Signed)
[facility] Emergency Department Provider Note       Time seen: ----------------------------------------- 8:13 PM on 02/18/2017 -----------------------------------------     I have reviewed the triage vital signs and the nursing notes.   HISTORY   Chief Complaint Swallowed Foreign Body    HPI Frank Mcdaniel is a 83 y.o. male who presents to the ED for difficulty swallowing. Patient reports she had pork chops for dinner as well as a tomato sandwich and he feels like it is stuck in his throat. Patient reports history of esophageal problems in the past and has t had his esophagus stretched in the past. Patient is unable to tolerate anything by mouth and is spitting saliva.   Past Medical History:  Diagnosis Date  . Anemia 09/01/2015  . Anxiety 09/11/2015  . Arthritis   . Asthma    as a teenager  . Asymptomatic Sinus Bradycardia   . Atypical chest pain    a. 08/2014  . BPH (benign prostatic hyperplasia)   . CKD (chronic kidney disease), stage III   . Coronary artery disease    a. 2000 s/p CABG;  b. 10/2008 Neg MV, EF 64%; c. cath 05/2015: LM 85% sev cal, pLAD-1 lesion 80%, pLAD-2 lesion 100% chronic, ostLCx 70% sev cal, OM4 90%, ostRCA 70%, mid RCA 90%, LIMA-LAD patent, VG-OM2 patent, VG-OM3 patent, VG-RPDA patent. No AS  . GERD (gastroesophageal reflux disease)   . H/O echocardiogram    a. 10/2005 Echo: nl EF.  [name] Hairy cell leukemia ([place])   . HOH (hard of hearing)    Bilateral hearing aids  . HTN (hypertension)   . Hyperlipidemia   . Hypertension   . Leukemia [facility])     Patient Active Problem List   Diagnosis Date Noted  . Status post inguinal hernia repair 09/13/2016  . Constipation 05/21/2016  . Chronic nausea 02/15/2016  . Memory difficulty 02/15/2016  . Chronic fatigue 12/21/2015  . Bilateral low back pain without sciatica 12/21/2015  . Borderline low oxygen saturation level 10/26/2015  . Anxiety 09/11/2015  . Anemia 09/01/2015  .  GERD (gastroesophageal reflux disease) 06/02/2015  . Angina pectoris ([place]) 05/26/2015  . Hypoxia 02/01/2015  . Asthma 01/28/2015  . Acute respiratory failure with hypoxemia ([place]) 01/28/2015  . BPH (benign prostatic hyperplasia) 01/28/2015  . Hairy cell leukemia ([place]) 01/28/2015  . Coronary artery disease   . Chest pain 09/02/2014  . S/P CABG (coronary artery bypass graft) 11/08/2010  . HTN (hypertension) 11/08/2010  . Bradycardia 11/08/2010  . Hyperlipidemia 10/27/2009    Past Surgical History:  Procedure Laterality Date  . CARDIAC CATHETERIZATION N/A 05/26/2015   Procedure: Left Heart Cath;  Surgeon: [name], MD;  Location: [place] CV LAB;  Service: Cardiovascular;  Laterality: N/A;  . CARDIAC CATHETERIZATION N/A 05/26/2015   Procedure: Coronary/Graft Angiography;  Surgeon: [name], MD;  Location: [place] CV [facility];  Service: Cardiovascular;  Laterality: N/A;  . CATARACT EXTRACTION    . CORONARY ARTERY BYPASS GRAFT  2000  . EYE SURGERY Left    Cataract Extraction with IOL  . INGUINAL HERNIA REPAIR Left 06/24/2016   Procedure: HERNIA REPAIR INGUINAL ADULT;  Surgeon: [name], MD;  Location: [facility] ORS;  Service: General;  Laterality: Left;  . TONSILLECTOMY  1942   . UPPER GASTROINTESTINAL ENDOSCOPY  3 years ago    with Dilation.     Allergies Patient has no known allergies.  Social History Social History  Substance Use Topics  .  Smoking status: Never Smoker  . Smokeless tobacco: Never Used  . Alcohol use No    Review of Systems Constitutional: Negative for fever. Eyes: Negative for vision changes ENT:  Negative for congestion, sore throat, Positive for dysphasia Cardiovascular: Negative for chest pain. Respiratory: Negative for shortness of breath. Gastrointestinal: Negative for abdominal pain, Positive for vomiting Genitourinary: Negative for dysuria. Musculoskeletal: Negative for back pain. Skin: Negative for rash. Neurological:  Negative for headaches, focal weakness or numbness.  All systems negative/normal/unremarkable except as stated in the HPI  ____________________________________________   PHYSICAL EXAM:  VITAL SIGNS: ED Triage Vitals [02/18/17 1911]  Enc Vitals Group     BP (!) 186/70     Pulse Rate (!) 58     Resp 17     Temp 98.2 F (36.8 C)     Temp Source Oral     SpO2 99 %     Weight 153 lb (69.4 kg)     Height 5\' 8"  (1.727 m)     Head Circumference      Peak Flow      Pain Score      Pain Loc      Pain Edu?      Excl. in [place]?    Constitutional: Alert and oriented. Mild distress Eyes: Conjunctivae are normal. Normal extraocular movements. ENT   Head: Normocephalic and atraumatic.   Nose: No congestion/rhinnorhea.   Mouth/Throat: Mucous membranes are moist.   Neck: No stridor. Cardiovascular: Normal rate, regular rhythm. No murmurs, rubs, or gallops. Respiratory: Normal respiratory effort without tachypnea nor retractions. Breath sounds are clear and equal bilaterally. No wheezes/rales/rhonchi. Gastrointestinal: Soft and nontender. Normal bowel sounds Musculoskeletal: Nontender with normal range of motion in extremities. No lower extremity tenderness nor edema. Neurologic:  Normal speech and language. No gross focal neurologic deficits are appreciated.  Skin:  Skin is warm, dry and intact. No rash noted. Psychiatric: Mood and affect are normal. Speech and behavior are normal.  ____________________________________________  ED COURSE:  Pertinent labs & imaging results that were available during my care of the patient were reviewed by me and considered in my medical decision making (see chart for details). Patient presents for esophageal food impaction, we will assess with labs as indicated. We will attempt carbonated beverage trial by mouth.   Procedures ____________________________________________   LABS (pertinent positives/negatives)  Labs Reviewed  CBC WITH  DIFFERENTIAL/PLATELET  COMPREHENSIVE METABOLIC PANEL   ____________________________________________  FINAL ASSESSMENT AND PLAN  Esophageal food impaction  Plan: Patient's labs  were dictated above. Patient had presented for dysphasia after eating dinner tonight. It has been 2 hours and he cannot tolerate anything by mouth including liquids or saliva. We attempted a trial of carbonated beverage orally which she subsequently spit and vomited back into the sink. I did discuss with gastric neurology on-call who will take the patient to the endoscopy suite.   Earleen Newport, MD   Note: This note was generated in part or whole with voice recognition software. Voice recognition is usually quite accurate but there are transcription errors that can and very often do occur. I apologize for any typographical errors that were not detected and corrected.     Earleen Newport, MD 02/18/17 2025

## 2017-02-18 NOTE — H&P (Signed)
Frank Lame, Mcdaniel [facility] [address] [place], [place] [address] [phone] Fax : [phone]  Primary Care Physician:  Frank Mcdaniel Primary Gastroenterologist:  Frank Mcdaniel  Pre-Procedure History & Physical: HPI:  Frank Mcdaniel is a 83 y.o. male is here for an endoscopy.   Past Medical History:  Diagnosis Date  . Anemia 09/01/2015  . Anxiety 09/11/2015  . Arthritis   . Asthma    as a teenager  . Asymptomatic Sinus Bradycardia   . Atypical chest pain    a. 08/2014  . BPH (benign prostatic hyperplasia)   . CKD (chronic kidney disease), stage III   . Coronary artery disease    a. 2000 s/p CABG;  b. 10/2008 Neg MV, EF 64%; c. cath 05/2015: LM 85% sev cal, pLAD-1 lesion 80%, pLAD-2 lesion 100% chronic, ostLCx 70% sev cal, OM4 90%, ostRCA 70%, mid RCA 90%, LIMA-LAD patent, VG-OM2 patent, VG-OM3 patent, VG-RPDA patent. No AS  . GERD (gastroesophageal reflux disease)   . H/O echocardiogram    a. 10/2005 Echo: nl EF.  [name] Hairy cell leukemia ([place])   . HOH (hard of hearing)    Bilateral hearing aids  . HTN (hypertension)   . Hyperlipidemia   . Hypertension   . Leukemia [facility])     Past Surgical History:  Procedure Laterality Date  . CARDIAC CATHETERIZATION N/A 05/26/2015   Procedure: Left Heart Cath;  Surgeon: [name], Mcdaniel;  Location: [place] CV LAB;  Service: Cardiovascular;  Laterality: N/A;  . CARDIAC CATHETERIZATION N/A 05/26/2015   Procedure: Coronary/Graft Angiography;  Surgeon: [name], Mcdaniel;  Location: [place] CV LAB;  Service: Cardiovascular;  Laterality: N/A;  . CATARACT EXTRACTION    . CORONARY ARTERY BYPASS GRAFT  2000  . EYE SURGERY Left    Cataract Extraction with IOL  . INGUINAL HERNIA REPAIR Left 06/24/2016   Procedure: HERNIA REPAIR INGUINAL ADULT;  Surgeon: [name], Mcdaniel;  Location: [facility] ORS;  Service: General;  Laterality: Left;  . TONSILLECTOMY  1942   . UPPER GASTROINTESTINAL ENDOSCOPY  3 years ago      with Dilation.     Prior to Admission medications   Medication Sig Start Date End Date Taking? Authorizing Provider  aspirin 81 MG EC tablet Take 2 tablets (162 mg total) by mouth daily. 11/08/10  Yes Gollan, Kathlene November, Mcdaniel  atorvastatin (LIPITOR) 40 MG tablet TAKE 1/2 TABLETS (20 MG TOTAL) BY MOUTH DAILY. 11/14/16  Yes Frank Mcdaniel  Calcium Carbonate Antacid (TUMS CHEWY BITES PO) Take 1 tablet by mouth as needed.    Yes Provider, Historical, Mcdaniel  isosorbide mononitrate (IMDUR) 30 MG 24 hr tablet Take 1 tablet (30 mg total) by mouth daily. 05/14/16  Yes Dunn, Areta Haber, PA-C  Multiple Vitamin (MULTIVITAMIN) tablet Take 1 tablet by mouth daily.     Yes Provider, Historical, Mcdaniel  polyethylene glycol (MIRALAX / GLYCOLAX) packet Take 17 g by mouth daily as needed.    Yes Provider, Historical, Mcdaniel  prochlorperazine (COMPAZINE) 10 MG tablet TAKE 1 TABLET (10 MG TOTAL) BY MOUTH EVERY 6 (SIX) HOURS AS NEEDED FOR NAUSEA OR VOMITING. 03/19/16  Yes Finnegan, Kathlene November, Mcdaniel  RANEXA 500 MG 12 hr tablet TAKE 1 TABLET BY MOUTH TWICE A DAY 12/24/16  Yes Gollan, Kathlene November, Mcdaniel  ranitidine (ZANTAC) 150 MG tablet Take 150 mg by mouth 2 (two) times daily. Reported on 12/25/2015   Yes Provider, Historical, Mcdaniel  solifenacin (VESICARE)  10 MG tablet Take 10 mg by mouth daily.   Yes Provider, Historical, Mcdaniel  tamsulosin (FLOMAX) 0.4 MG CAPS capsule TAKE 1 CAPSULE (0.4 MG TOTAL) BY MOUTH DAILY. 06/14/16  Yes Frank Mcdaniel    Allergies as of 02/18/2017  . (No Known Allergies)    Family History  Problem Relation Age of Onset  . Hypertension Mother   . Arthritis Father   . Other Unknown        no premature CAD.  [name] Colon cancer Neg Hx   . Prostate cancer Neg Hx   . Stomach cancer Neg Hx     Social History   Social History  . Marital status: Widowed    Spouse name: N/A  . Number of children: 1  . Years of education: N/A   Occupational History  . retired    Social History Main Topics  . Smoking  status: Never Smoker  . Smokeless tobacco: Never Used  . Alcohol use No  . Drug use: No  . Sexual activity: No   Other Topics Concern  . Not on file   Social History Narrative   Lives locally with son.  Fairly active around the house though does not routinely exercise.  Retired from [employer].    Review of Systems: See HPI, otherwise negative ROS  Physical Exam: BP (!) 186/70 (BP Location: Left Arm)   Pulse (!) 58   Temp 98.2 F (36.8 C) (Oral)   Resp 17   Ht 5\' 8"  (1.727 m)   Wt 153 lb (69.4 kg)   SpO2 99%   BMI 23.26 kg/m  General:   Alert,  pleasant and cooperative in NAD Head:  Normocephalic and atraumatic. Neck:  Supple; no masses or thyromegaly. Lungs:  Clear throughout to auscultation.    Heart:  Regular rate and rhythm. Abdomen:  Soft, nontender and nondistended. Normal bowel sounds, without guarding, and without rebound.   Neurologic:  Alert and  oriented x4;  grossly normal neurologically.  Impression/Plan: Frank Mcdaniel is here for an endoscopy to be performed for food bolus  Risks, benefits, limitations, and alternatives regarding  endoscopy have been reviewed with the patient.  Questions have been answered.  All parties agreeable.   Frank Lame, Mcdaniel  02/18/2017, 10:13 PM

## 2017-02-18 NOTE — Anesthesia Preprocedure Evaluation (Signed)
Anesthesia Evaluation  Patient identified by MRN, date of birth, ID band Patient awake    Reviewed: Allergy & Precautions, NPO status , Patient's Chart, lab work & pertinent test results, reviewed documented beta blocker date and time   Airway Mallampati: II  TM Distance: >3 FB     Dental  (+) Chipped   Pulmonary asthma ,           Cardiovascular hypertension, Pt. on medications + angina + CAD and + CABG       Neuro/Psych Anxiety    GI/Hepatic GERD  ,  Endo/Other    Renal/GU Renal disease     Musculoskeletal  (+) Arthritis ,   Abdominal   Peds  Hematology  (+) anemia ,   Anesthesia Other Findings   Reproductive/Obstetrics                             Anesthesia Physical Anesthesia Plan  ASA: III  Anesthesia Plan: General   Post-op Pain Management:    Induction: Intravenous  PONV Risk Score and Plan:   Airway Management Planned: Oral ETT  Additional Equipment:   Intra-op Plan:   Post-operative Plan:   Informed Consent: I have reviewed the patients History and Physical, chart, labs and discussed the procedure including the risks, benefits and alternatives for the proposed anesthesia with the patient or authorized representative who has indicated his/her understanding and acceptance.     Plan Discussed with: CRNA  Anesthesia Plan Comments:         Anesthesia Quick Evaluation

## 2017-02-18 NOTE — ED Triage Notes (Signed)
Pt ambulatory to triage with steady gait, no distress noted. PT reports he had a pork chop for dinner and feels like a piece has caught in throat. Pt sts he has had this issue in the past and has had to have an endoscopy. Pts breathing normal, pts airway not obstructed, pt s[peaking in complete sentences. Warm coke given in triage.

## 2017-02-18 NOTE — Anesthesia Procedure Notes (Signed)
Procedure Name: Intubation Date/Time: 02/18/2017 10:20 PM Performed by: Lendon Colonel Pre-anesthesia Checklist: Emergency Drugs available, Patient identified, Patient being monitored, Suction available and Timeout performed Patient Re-evaluated:Patient Re-evaluated prior to induction Oxygen Delivery Method: Circle system utilized Preoxygenation: Pre-oxygenation with 100% oxygen Induction Type: IV induction, Rapid sequence and Cricoid Pressure applied Ventilation: Mask ventilation without difficulty Laryngoscope Size: McGraph Grade View: Grade III Tube size: 7.5 mm Number of attempts: 3 Airway Equipment and Method: Stylet Placement Confirmation: ETT inserted through vocal cords under direct vision,  positive ETCO2 and breath sounds checked- equal and bilateral Secured at: 21 cm Tube secured with: Tape Dental Injury: Teeth and Oropharynx as per pre-operative assessment  Difficulty Due To: Difficulty was anticipated, Difficult Airway- due to reduced neck mobility and Difficult Airway- due to anterior larynx Future Recommendations: Recommend- induction with short-acting agent, and alternative techniques readily available

## 2017-02-18 NOTE — Discharge Instructions (Signed)
Mechanical soft diet.  No aspirin for ten days. Continue present medications.AMBULATORY SURGERY  DISCHARGE INSTRUCTIONS   1) The drugs that you were given will stay in your system until tomorrow so for the next 24 hours you should not:  A) Drive an automobile B) Make any legal decisions C) Drink any alcoholic beverage   2) You may resume regular meals tomorrow.  Today it is better to start with liquids and gradually work up to solid foods.  You may eat anything you prefer, but it is better to start with liquids, then soup and crackers, and gradually work up to solid foods.   3) Please notify your doctor immediately if you have any unusual bleeding, trouble breathing, redness and pain at the surgery site, drainage, fever, or pain not relieved by medication.    4) Additional Instructions:        Please contact your physician with any problems or Same Day Surgery at [phone], Monday through Friday 6 am to 4 pm, or [place] at [facility] number at [phone].

## 2017-02-19 ENCOUNTER — Encounter: Payer: Self-pay | Admitting: Gastroenterology

## 2017-02-19 ENCOUNTER — Telehealth: Payer: Self-pay | Admitting: Gastroenterology

## 2017-02-19 NOTE — Telephone Encounter (Signed)
Patient called back and said you can call him on his cell phone this morning.

## 2017-02-19 NOTE — Telephone Encounter (Signed)
Patient had an EGD yesterday and feels something is moving back there in his throat. He also needs another appt for an EGD.  Also, can he continue on his 81 mg aspirin? He will be out this morning please call this afternoon.

## 2017-02-19 NOTE — Anesthesia Postprocedure Evaluation (Signed)
Anesthesia Post Note  Patient: Frank Mcdaniel  Procedure(s) Performed: Procedure(s) (LRB): ESOPHAGOGASTRODUODENOSCOPY (EGD) with removal of food bolus (N/A)  Patient location during evaluation: Endoscopy Anesthesia Type: General Level of consciousness: awake and alert Pain management: pain level controlled Vital Signs Assessment: post-procedure vital signs reviewed and stable Respiratory status: spontaneous breathing, nonlabored ventilation, respiratory function stable and patient connected to nasal cannula oxygen Cardiovascular status: blood pressure returned to baseline and stable Postop Assessment: no signs of nausea or vomiting Anesthetic complications: no     Last Vitals:  Vitals:   02/18/17 2340 02/18/17 2345  BP:  (!) 181/64  Pulse: 77 68  Resp:    Temp:  (!) 36 C    Last Pain:  Vitals:   02/18/17 1911  TempSrc: Oral                 [name]

## 2017-02-19 NOTE — Telephone Encounter (Signed)
Let the patient know that he was intubated during the procedure to protect his  airway. therefore may have irritation. It should go away in time. Continue his aspirin

## 2017-02-19 NOTE — Telephone Encounter (Signed)
See below and advise. Pt stated he feels like there is something in his esophagus. Could this be irritation from the food that was stuck? Please advise.

## 2017-02-21 ENCOUNTER — Other Ambulatory Visit: Payer: Self-pay

## 2017-02-21 DIAGNOSIS — R1310 Dysphagia, unspecified: Secondary | ICD-10-CM

## 2017-02-21 NOTE — Telephone Encounter (Signed)
Pt's son was advised of pt's concerns that were addressed with Dr. Allen Norris.

## 2017-03-10 ENCOUNTER — Encounter: Payer: Self-pay | Admitting: *Deleted

## 2017-03-11 ENCOUNTER — Encounter: Payer: Self-pay | Admitting: Anesthesiology

## 2017-03-11 ENCOUNTER — Encounter
Admission: RE | Disposition: A | Discharge status: Home / Self Care | Payer: Self-pay | Source: Ambulatory Visit | Attending: Gastroenterology

## 2017-03-11 ENCOUNTER — Ambulatory Visit: Payer: Medicare Other | Admitting: Anesthesiology

## 2017-03-11 ENCOUNTER — Ambulatory Visit
Admission: RE | Admit: 2017-03-11 | Discharge: 2017-03-11 | Disposition: A | Discharge status: Home / Self Care | Payer: Medicare Other | Source: Ambulatory Visit | Attending: Gastroenterology | Admitting: Gastroenterology

## 2017-03-11 DIAGNOSIS — E785 Hyperlipidemia, unspecified: Secondary | ICD-10-CM | POA: Insufficient documentation

## 2017-03-11 DIAGNOSIS — R1310 Dysphagia, unspecified: Secondary | ICD-10-CM | POA: Diagnosis not present

## 2017-03-11 DIAGNOSIS — K222 Esophageal obstruction: Secondary | ICD-10-CM | POA: Diagnosis not present

## 2017-03-11 DIAGNOSIS — N183 Chronic kidney disease, stage 3 (moderate): Secondary | ICD-10-CM | POA: Insufficient documentation

## 2017-03-11 DIAGNOSIS — I129 Hypertensive chronic kidney disease with stage 1 through stage 4 chronic kidney disease, or unspecified chronic kidney disease: Secondary | ICD-10-CM | POA: Diagnosis not present

## 2017-03-11 DIAGNOSIS — Z856 Personal history of leukemia: Secondary | ICD-10-CM | POA: Insufficient documentation

## 2017-03-11 DIAGNOSIS — F419 Anxiety disorder, unspecified: Secondary | ICD-10-CM | POA: Insufficient documentation

## 2017-03-11 DIAGNOSIS — J45909 Unspecified asthma, uncomplicated: Secondary | ICD-10-CM | POA: Insufficient documentation

## 2017-03-11 DIAGNOSIS — N400 Enlarged prostate without lower urinary tract symptoms: Secondary | ICD-10-CM | POA: Diagnosis not present

## 2017-03-11 DIAGNOSIS — Z951 Presence of aortocoronary bypass graft: Secondary | ICD-10-CM | POA: Insufficient documentation

## 2017-03-11 DIAGNOSIS — M1990 Unspecified osteoarthritis, unspecified site: Secondary | ICD-10-CM | POA: Insufficient documentation

## 2017-03-11 DIAGNOSIS — I2510 Atherosclerotic heart disease of native coronary artery without angina pectoris: Secondary | ICD-10-CM | POA: Insufficient documentation

## 2017-03-11 DIAGNOSIS — Z79899 Other long term (current) drug therapy: Secondary | ICD-10-CM | POA: Insufficient documentation

## 2017-03-11 DIAGNOSIS — K219 Gastro-esophageal reflux disease without esophagitis: Secondary | ICD-10-CM | POA: Insufficient documentation

## 2017-03-11 HISTORY — PX: ESOPHAGOGASTRODUODENOSCOPY (EGD) WITH PROPOFOL: SHX5813

## 2017-03-11 SURGERY — ESOPHAGOGASTRODUODENOSCOPY (EGD) WITH PROPOFOL
Anesthesia: General

## 2017-03-11 MED ORDER — PROPOFOL 500 MG/50ML IV EMUL
INTRAVENOUS | Status: AC
  Filled 2017-03-11: qty 50

## 2017-03-11 MED ORDER — GLYCOPYRROLATE 0.2 MG/ML IJ SOLN
INTRAMUSCULAR | Status: DC | PRN
  Administered 2017-03-11: 0.1 mg via INTRAVENOUS

## 2017-03-11 MED ORDER — PROPOFOL 500 MG/50ML IV EMUL
INTRAVENOUS | Status: DC | PRN
  Administered 2017-03-11: 125 ug/kg/min via INTRAVENOUS

## 2017-03-11 MED ORDER — GLYCOPYRROLATE 0.2 MG/ML IJ SOLN
INTRAMUSCULAR | Status: AC
  Filled 2017-03-11: qty 1

## 2017-03-11 MED ORDER — PROPOFOL 10 MG/ML IV BOLUS
INTRAVENOUS | Status: DC | PRN
Start: 2017-03-11 — End: 2017-03-11
  Administered 2017-03-11: 30 mg via INTRAVENOUS
  Administered 2017-03-11: 40 mg via INTRAVENOUS

## 2017-03-11 MED ORDER — SODIUM CHLORIDE 0.9 % IV SOLN
INTRAVENOUS | Status: DC
  Administered 2017-03-11: 10:00:00 via INTRAVENOUS

## 2017-03-11 MED ORDER — FENTANYL CITRATE (PF) 100 MCG/2ML IJ SOLN: 25.0000 ug | INTRAMUSCULAR | Status: DC | PRN

## 2017-03-11 MED ORDER — ONDANSETRON HCL 4 MG/2ML IJ SOLN: 4.0000 mg | Freq: Once | INTRAMUSCULAR | Status: DC | PRN

## 2017-03-11 NOTE — Anesthesia Postprocedure Evaluation (Signed)
Anesthesia Post Note  Patient: Frank Mcdaniel  Procedure(s) Performed: Procedure(s) (LRB): ESOPHAGOGASTRODUODENOSCOPY (EGD) WITH PROPOFOL (N/A)  Patient location during evaluation: PACU Anesthesia Type: General Level of consciousness: awake and alert and oriented Pain management: pain level controlled Vital Signs Assessment: post-procedure vital signs reviewed and stable Respiratory status: spontaneous breathing Cardiovascular status: blood pressure returned to baseline Anesthetic complications: no     Last Vitals:  Vitals:   03/11/17 1110 03/11/17 1120  BP: 131/72 (!) 141/69  Pulse: (!) 58 (!) 58  Resp: (!) 22 19  Temp:      Last Pain:  Vitals:   03/11/17 1049  TempSrc: Tympanic  PainSc: 0-No pain                 Macai Sisneros

## 2017-03-11 NOTE — Op Note (Signed)
[facility] Gastroenterology Patient Name: Frank Mcdaniel Procedure Date: 03/11/2017 10:31 AM MRN: [id number] Account #: [id number] Date of Birth: Mar 04, 1933 Admit Type: Outpatient Age: 83 Room: [facility] ENDO ROOM 4 Gender: Male Note Status: Finalized Procedure:            Upper GI endoscopy Indications:          Dysphagia Providers:            [name] MD, MD Referring MD:         Angela Adam. Caryl Bis (Referring MD) Medicines:            Propofol per Anesthesia Complications:        No immediate complications. Procedure:            Pre-Anesthesia Assessment:                       - Prior to the procedure, a History and Physical was                        performed, and patient medications and allergies were                        reviewed. The patient's tolerance of previous                        anesthesia was also reviewed. The risks and benefits of                        the procedure and the sedation options and risks were                        discussed with the patient. All questions were                        answered, and informed consent was obtained. Prior                        Anticoagulants: The patient has taken no previous                        anticoagulant or antiplatelet agents. ASA Grade                        Assessment: II - A patient with mild systemic disease.                        After reviewing the risks and benefits, the patient was                        deemed in satisfactory condition to undergo the                        procedure.                       After obtaining informed consent, the endoscope was                        passed under direct vision. Throughout the procedure,  the patient's blood pressure, pulse, and oxygen                        saturations were monitored continuously. The Endoscope                        was introduced through the mouth, and advanced to the   second part of duodenum. The upper GI endoscopy was                        accomplished without difficulty. The patient tolerated                        the procedure well. Findings:      One moderate benign-appearing, intrinsic stenosis was found at the       gastroesophageal junction. And was traversed. A TTS dilator was passed       through the scope. Dilation with a 12-13.5-15 mm balloon dilator was       performed to 15 mm. The dilation site was examined following endoscope       reinsertion and showed moderate improvement in luminal narrowing.      The stomach was normal.      The examined duodenum was normal. Impression:           - Benign-appearing esophageal stenosis. Dilated.                       - Normal stomach.                       - Normal examined duodenum.                       - No specimens collected. Recommendation:       - Discharge patient to home.                       - Resume previous diet.                       - Continue present medications. Procedure Code(s):    --- Professional ---                       [phone], Esophagogastroduodenoscopy, flexible, transoral;                        with transendoscopic balloon dilation of esophagus                        (less than 30 mm diameter) Diagnosis Code(s):    --- Professional ---                       R13.10, Dysphagia, unspecified                       K22.2, Esophageal obstruction CPT copyright 2016 American Medical Association. All rights reserved. The codes documented in this report are preliminary and upon coder review may  be revised to meet current compliance requirements. [name] MD, MD 03/11/2017 10:43:59 AM This report has been signed electronically. Number of Addenda: 0 Note Initiated On: 03/11/2017 10:31 AM      [facility]

## 2017-03-11 NOTE — Anesthesia Post-op Follow-up Note (Signed)
Anesthesia QCDR form completed.        

## 2017-03-11 NOTE — Transfer of Care (Signed)
Immediate Anesthesia Transfer of Care Note  Patient: Frank Mcdaniel  Procedure(s) Performed: Procedure(s): ESOPHAGOGASTRODUODENOSCOPY (EGD) WITH PROPOFOL (N/A)  Patient Location: PACU  Anesthesia Type:General  Level of Consciousness: drowsy and patient cooperative  Airway & Oxygen Therapy: Patient Spontanous Breathing and Patient connected to nasal cannula oxygen  Post-op Assessment: Report given to RN and Post -op Vital signs reviewed and stable  Post vital signs: Reviewed and stable  Last Vitals:  Vitals:   03/11/17 1049 03/11/17 1050  BP: 121/64 121/64  Pulse: (!) 59 (!) 59  Resp: (!) 25 (!) 24  Temp:      Last Pain:  Vitals:   03/11/17 1049  TempSrc: Tympanic  PainSc: 0-No pain         Complications: No apparent anesthesia complications

## 2017-03-11 NOTE — Anesthesia Procedure Notes (Signed)
Date/Time: 03/11/2017 10:32 AM Performed by: Darlyne Russian Pre-anesthesia Checklist: Patient identified, Emergency Drugs available, Suction available, Patient being monitored and Timeout performed Patient Re-evaluated:Patient Re-evaluated prior to induction Oxygen Delivery Method: Nasal cannula Placement Confirmation: positive ETCO2

## 2017-03-11 NOTE — H&P (Signed)
Frank Lame, MD [facility] [address], [place], [place] [address] Phone:[phone] Fax : [phone]  Primary Care Physician:  [name], MD Primary Gastroenterologist:  Dr. Allen Norris  Pre-Procedure History & Physical: HPI:  Frank Mcdaniel is a 83 y.o. male is here for an endoscopy.   Past Medical History:  Diagnosis Date  . Anemia 09/01/2015  . Anxiety 09/11/2015  . Arthritis   . Asthma    as a teenager  . Asymptomatic Sinus Bradycardia   . Atypical chest pain    a. 08/2014  . BPH (benign prostatic hyperplasia)   . CKD (chronic kidney disease), stage III   . Coronary artery disease    a. 2000 s/p CABG;  b. 10/2008 Neg MV, EF 64%; c. cath 05/2015: LM 85% sev cal, pLAD-1 lesion 80%, pLAD-2 lesion 100% chronic, ostLCx 70% sev cal, OM4 90%, ostRCA 70%, mid RCA 90%, LIMA-LAD patent, VG-OM2 patent, VG-OM3 patent, VG-RPDA patent. No AS  . GERD (gastroesophageal reflux disease)   . H/O echocardiogram    a. 10/2005 Echo: nl EF.  [name] Hairy cell leukemia ([place])   . HOH (hard of hearing)    Bilateral hearing aids  . HTN (hypertension)   . Hyperlipidemia   . Hypertension   . Leukemia [facility])     Past Surgical History:  Procedure Laterality Date  . CARDIAC CATHETERIZATION N/A 05/26/2015   Procedure: Left Heart Cath;  Surgeon: [name], MD;  Location: [place] CV LAB;  Service: Cardiovascular;  Laterality: N/A;  . CARDIAC CATHETERIZATION N/A 05/26/2015   Procedure: Coronary/Graft Angiography;  Surgeon: [name], MD;  Location: [place] CV LAB;  Service: Cardiovascular;  Laterality: N/A;  . CATARACT EXTRACTION    . CORONARY ARTERY BYPASS GRAFT  2000  . ESOPHAGOGASTRODUODENOSCOPY N/A 02/18/2017   Procedure: ESOPHAGOGASTRODUODENOSCOPY (EGD) with removal of food bolus;  Surgeon: Frank Lame, MD;  Location: [facility] ENDOSCOPY;  Service: Endoscopy;  Laterality: N/A;  . EYE SURGERY Left    Cataract Extraction with IOL  . INGUINAL HERNIA REPAIR Left 06/24/2016    Procedure: HERNIA REPAIR INGUINAL ADULT;  Surgeon: [name], MD;  Location: [facility] ORS;  Service: General;  Laterality: Left;  . TONSILLECTOMY  1942   . UPPER GASTROINTESTINAL ENDOSCOPY  3 years ago    with Dilation.     Prior to Admission medications   Medication Sig Start Date End Date Taking? Authorizing Provider  atorvastatin (LIPITOR) 40 MG tablet TAKE 1/2 TABLETS (20 MG TOTAL) BY MOUTH DAILY. 11/14/16  Yes [name], MD  Calcium Carbonate Antacid (TUMS CHEWY BITES PO) Take 1 tablet by mouth as needed.    Yes [provider]  isosorbide mononitrate (IMDUR) 30 MG 24 hr tablet Take 1 tablet (30 mg total) by mouth daily. 05/14/16  Yes Dunn, Areta Haber, PA-C  Multiple Vitamin (MULTIVITAMIN) tablet Take 1 tablet by mouth daily.     Yes [provider]  RANEXA 500 MG 12 hr tablet TAKE 1 TABLET BY MOUTH TWICE A DAY 12/24/16  Yes Gollan, Kathlene November, MD  ranitidine (ZANTAC) 150 MG tablet Take 150 mg by mouth 2 (two) times daily. Reported on 12/25/2015   Yes [provider]  solifenacin (VESICARE) 10 MG tablet Take 10 mg by mouth daily.   Yes [provider]  tamsulosin (FLOMAX) 0.4 MG CAPS capsule TAKE 1 CAPSULE (0.4 MG TOTAL) BY MOUTH DAILY. 06/14/16  Yes [name], MD  aspirin 81 MG EC tablet Take 2  tablets (162 mg total) by mouth daily. 11/08/10   [name], MD  polyethylene glycol (MIRALAX / GLYCOLAX) packet Take 17 g by mouth daily as needed.     [provider]  prochlorperazine (COMPAZINE) 10 MG tablet TAKE 1 TABLET (10 MG TOTAL) BY MOUTH EVERY 6 (SIX) HOURS AS NEEDED FOR NAUSEA OR VOMITING. 03/19/16   [name], MD    Allergies as of 02/21/2017  . (No Known Allergies)    Family History  Problem Relation Age of Onset  . Hypertension Mother   . Arthritis Father   . Other Unknown        no premature CAD.  [name] Colon cancer Neg Hx   . Prostate cancer Neg Hx   . Stomach cancer Neg Hx     Social History    Social History  . Marital status: Widowed    Spouse name: N/A  . Number of children: 1  . Years of education: N/A   Occupational History  . retired    Social History Main Topics  . Smoking status: Never Smoker  . Smokeless tobacco: Never Used  . Alcohol use No  . Drug use: No  . Sexual activity: No   Other Topics Concern  . Not on file   Social History Narrative   Lives locally with son.  Fairly active around the house though does not routinely exercise.  Retired from [employer].    Review of Systems: See HPI, otherwise negative ROS  Physical Exam: BP (!) 148/55   Pulse 62   Temp 98.9 F (37.2 C) (Oral)   Resp 16   Ht 5\' 8"  (1.727 m)   Wt 153 lb (69.4 kg)   SpO2 99%   BMI 23.26 kg/m  General:   Alert,  pleasant and cooperative in NAD Head:  Normocephalic and atraumatic. Neck:  Supple; no masses or thyromegaly. Lungs:  Clear throughout to auscultation.    Heart:  Regular rate and rhythm. Abdomen:  Soft, nontender and nondistended. Normal bowel sounds, without guarding, and without rebound.   Neurologic:  Alert and  oriented x4;  grossly normal neurologically.  Impression/Plan: Frank Mcdaniel is here for an endoscopy to be performed for dysphagia  Risks, benefits, limitations, and alternatives regarding  endoscopy have been reviewed with the patient.  Questions have been answered.  All parties agreeable.   Frank Lame, MD  03/11/2017, 10:27 AM

## 2017-03-11 NOTE — Anesthesia Preprocedure Evaluation (Addendum)
Anesthesia Evaluation  Patient identified by MRN, date of birth, ID band Patient awake    Reviewed: Allergy & Precautions, NPO status , Patient's Chart, lab work & pertinent test results, reviewed documented beta blocker date and time   Airway Mallampati: II  TM Distance: >3 FB     Dental  (+) Chipped   Pulmonary asthma ,           Cardiovascular hypertension, Pt. on medications + angina + CAD and + CABG       Neuro/Psych Anxiety negative neurological ROS     GI/Hepatic GERD  ,  Endo/Other    Renal/GU Renal disease     Musculoskeletal  (+) Arthritis ,   Abdominal   Peds  Hematology  (+) anemia ,   Anesthesia Other Findings Past Medical History: 09/01/2015: Anemia 09/11/2015: Anxiety No date: Arthritis No date: Asthma     Comment:  as a teenager No date: Asymptomatic Sinus Bradycardia No date: Atypical chest pain     Comment:  a. 08/2014 No date: BPH (benign prostatic hyperplasia) No date: CKD (chronic kidney disease), stage III No date: Coronary artery disease     Comment:  a. 2000 s/p CABG;  b. 10/2008 Neg MV, EF 64%; c. cath               05/2015: LM 85% sev cal, pLAD-1 lesion 80%, pLAD-2 lesion              100% chronic, ostLCx 70% sev cal, OM4 90%, ostRCA 70%,               mid RCA 90%, LIMA-LAD patent, VG-OM2 patent, VG-OM3               patent, VG-RPDA patent. No AS No date: GERD (gastroesophageal reflux disease) No date: H/O echocardiogram     Comment:  a. 10/2005 Echo: nl EF. No date: Hairy cell leukemia ([place]) No date: HOH (hard of hearing)     Comment:  Bilateral hearing aids No date: HTN (hypertension) No date: Hyperlipidemia No date: Hypertension No date: Leukemia [facility])  Reproductive/Obstetrics                             Anesthesia Physical  Anesthesia Plan  ASA: III  Anesthesia Plan: General   Post-op Pain Management:    Induction: Intravenous  PONV Risk  Score and Plan:   Airway Management Planned: Nasal Cannula  Additional Equipment:   Intra-op Plan:   Post-operative Plan:   Informed Consent: I have reviewed the patients History and Physical, chart, labs and discussed the procedure including the risks, benefits and alternatives for the proposed anesthesia with the patient or authorized representative who has indicated his/her understanding and acceptance.   Dental advisory given  Plan Discussed with: CRNA  Anesthesia Plan Comments:         Anesthesia Quick Evaluation

## 2017-03-12 ENCOUNTER — Encounter: Payer: Self-pay | Admitting: Gastroenterology

## 2017-04-13 ENCOUNTER — Emergency Department
Admission: EM | Admit: 2017-04-13 | Discharge: 2017-04-13 | Disposition: A | Discharge status: Home / Self Care | Payer: Medicare Other | Attending: Emergency Medicine | Admitting: Emergency Medicine

## 2017-04-13 DIAGNOSIS — Y93G1 Activity, food preparation and clean up: Secondary | ICD-10-CM | POA: Diagnosis not present

## 2017-04-13 DIAGNOSIS — Y998 Other external cause status: Secondary | ICD-10-CM | POA: Diagnosis not present

## 2017-04-13 DIAGNOSIS — Y929 Unspecified place or not applicable: Secondary | ICD-10-CM | POA: Diagnosis not present

## 2017-04-13 DIAGNOSIS — W458XXA Other foreign body or object entering through skin, initial encounter: Secondary | ICD-10-CM | POA: Insufficient documentation

## 2017-04-13 DIAGNOSIS — T17228A Food in pharynx causing other injury, initial encounter: Secondary | ICD-10-CM | POA: Insufficient documentation

## 2017-04-13 NOTE — ED Notes (Signed)
Pt states he feels like his food bolus went down, states he will follow up with GI in the morning, but that he doesn't see the point in staying.  Pt instructed to come back to the ER if feelings come back.  Pt verbalized understanding.

## 2017-04-13 NOTE — ED Triage Notes (Signed)
Pt reports that approx an hour ago he was eating chicken and got it stuck in his esophagus, pt reports that this happened recently and he had to have GI due an endoscopy to remove it, pt states nothing will go past it

## 2017-04-14 ENCOUNTER — Other Ambulatory Visit: Payer: Self-pay

## 2017-04-14 DIAGNOSIS — R1310 Dysphagia, unspecified: Secondary | ICD-10-CM

## 2017-04-15 ENCOUNTER — Encounter
Admission: RE | Disposition: A | Discharge status: Home / Self Care | Payer: Self-pay | Source: Ambulatory Visit | Attending: Gastroenterology

## 2017-04-15 ENCOUNTER — Ambulatory Visit: Payer: Medicare Other | Admitting: Anesthesiology

## 2017-04-15 ENCOUNTER — Ambulatory Visit
Admission: RE | Admit: 2017-04-15 | Discharge: 2017-04-15 | Disposition: A | Discharge status: Home / Self Care | Payer: Medicare Other | Source: Ambulatory Visit | Attending: Gastroenterology | Admitting: Gastroenterology

## 2017-04-15 ENCOUNTER — Encounter: Payer: Self-pay | Admitting: Anesthesiology

## 2017-04-15 DIAGNOSIS — N183 Chronic kidney disease, stage 3 (moderate): Secondary | ICD-10-CM | POA: Diagnosis not present

## 2017-04-15 DIAGNOSIS — R1310 Dysphagia, unspecified: Secondary | ICD-10-CM

## 2017-04-15 DIAGNOSIS — Z951 Presence of aortocoronary bypass graft: Secondary | ICD-10-CM | POA: Insufficient documentation

## 2017-04-15 DIAGNOSIS — K222 Esophageal obstruction: Secondary | ICD-10-CM | POA: Insufficient documentation

## 2017-04-15 DIAGNOSIS — I2510 Atherosclerotic heart disease of native coronary artery without angina pectoris: Secondary | ICD-10-CM | POA: Diagnosis not present

## 2017-04-15 DIAGNOSIS — Z79899 Other long term (current) drug therapy: Secondary | ICD-10-CM | POA: Diagnosis not present

## 2017-04-15 DIAGNOSIS — F419 Anxiety disorder, unspecified: Secondary | ICD-10-CM | POA: Insufficient documentation

## 2017-04-15 DIAGNOSIS — N400 Enlarged prostate without lower urinary tract symptoms: Secondary | ICD-10-CM | POA: Insufficient documentation

## 2017-04-15 DIAGNOSIS — I129 Hypertensive chronic kidney disease with stage 1 through stage 4 chronic kidney disease, or unspecified chronic kidney disease: Secondary | ICD-10-CM | POA: Insufficient documentation

## 2017-04-15 DIAGNOSIS — Z7982 Long term (current) use of aspirin: Secondary | ICD-10-CM | POA: Insufficient documentation

## 2017-04-15 DIAGNOSIS — E785 Hyperlipidemia, unspecified: Secondary | ICD-10-CM | POA: Insufficient documentation

## 2017-04-15 DIAGNOSIS — J45909 Unspecified asthma, uncomplicated: Secondary | ICD-10-CM | POA: Diagnosis not present

## 2017-04-15 HISTORY — PX: ESOPHAGOGASTRODUODENOSCOPY (EGD) WITH PROPOFOL: SHX5813

## 2017-04-15 SURGERY — ESOPHAGOGASTRODUODENOSCOPY (EGD) WITH PROPOFOL
Anesthesia: General

## 2017-04-15 MED ORDER — PROPOFOL 500 MG/50ML IV EMUL
INTRAVENOUS | Status: DC | PRN
  Administered 2017-04-15: 150 ug/kg/min via INTRAVENOUS

## 2017-04-15 MED ORDER — PROPOFOL 10 MG/ML IV BOLUS
INTRAVENOUS | Status: DC | PRN
  Administered 2017-04-15: 70 mg via INTRAVENOUS

## 2017-04-15 MED ORDER — MIDAZOLAM HCL 2 MG/2ML IJ SOLN
INTRAMUSCULAR | Status: AC
  Filled 2017-04-15: qty 2

## 2017-04-15 MED ORDER — LIDOCAINE HCL (CARDIAC) 20 MG/ML IV SOLN
INTRAVENOUS | Status: DC | PRN
  Administered 2017-04-15: 40 mg via INTRAVENOUS

## 2017-04-15 MED ORDER — SODIUM CHLORIDE 0.9 % IV SOLN
INTRAVENOUS | Status: DC
  Administered 2017-04-15 (×2): via INTRAVENOUS

## 2017-04-15 NOTE — Transfer of Care (Signed)
Immediate Anesthesia Transfer of Care Note  Patient: Frank Mcdaniel  Procedure(s) Performed: Procedure(s): ESOPHAGOGASTRODUODENOSCOPY (EGD) WITH PROPOFOL (N/A)  Patient Location: PACU  Anesthesia Type:General  Level of Consciousness: sedated and responds to stimulation  Airway & Oxygen Therapy: Patient Spontanous Breathing and Patient connected to nasal cannula oxygen  Post-op Assessment: Report given to RN and Post -op Vital signs reviewed and stable  Post vital signs: Reviewed and stable  Last Vitals:  Vitals:   04/15/17 1153  BP: 128/69  Pulse: (!) 55  Resp: (!) 22  SpO2: 100%    Last Pain: There were no vitals filed for this visit.       Complications: No apparent anesthesia complications

## 2017-04-15 NOTE — Op Note (Signed)
[facility] Gastroenterology Patient Name: Frank Mcdaniel Procedure Date: 04/15/2017 11:40 AM MRN: [id number] Account #: [id number] Date of Birth: Dec 23, 1932 Admit Type: Outpatient Age: 83 Room: [facility] ENDO ROOM 4 Gender: Male Note Status: Finalized Procedure:            Upper GI endoscopy Indications:          Dysphagia Providers:            [name] MD, MD Referring MD:         Angela Adam. Caryl Bis (Referring MD) Medicines:            Propofol per Anesthesia Complications:        No immediate complications. Procedure:            Pre-Anesthesia Assessment:                       - Prior to the procedure, a History and Physical was                        performed, and patient medications and allergies were                        reviewed. The patient's tolerance of previous                        anesthesia was also reviewed. The risks and benefits of                        the procedure and the sedation options and risks were                        discussed with the patient. All questions were                        answered, and informed consent was obtained. Prior                        Anticoagulants: The patient has taken no previous                        anticoagulant or antiplatelet agents. ASA Grade                        Assessment: II - A patient with mild systemic disease.                        After reviewing the risks and benefits, the patient was                        deemed in satisfactory condition to undergo the                        procedure.                       After obtaining informed consent, the endoscope was                        passed under direct vision. Throughout the procedure,  the patient's blood pressure, pulse, and oxygen                        saturations were monitored continuously. The Endoscope                        was introduced through the mouth, and advanced to the   second part of duodenum. The upper GI endoscopy was                        accomplished without difficulty. The patient tolerated                        the procedure well. Findings:      One moderate benign-appearing, intrinsic stenosis was found at the       gastroesophageal junction. And was traversed. A TTS dilator was passed       through the scope. Dilation with a 15-16.5-18 mm balloon dilator was       performed to 18 mm. The dilation site was examined following endoscope       reinsertion and showed complete resolution of luminal narrowing.      The stomach was normal.      The examined duodenum was normal. Impression:           - Benign-appearing esophageal stenosis. Dilated.                       - Normal stomach.                       - Normal examined duodenum.                       - No specimens collected. Recommendation:       - Discharge patient to home.                       - Resume previous diet.                       - Continue present medications.                       - Repeat upper endoscopy (date not yet determined) for                        retreatment. Procedure Code(s):    --- Professional ---                       [phone], Esophagogastroduodenoscopy, flexible, transoral;                        with transendoscopic balloon dilation of esophagus                        (less than 30 mm diameter) Diagnosis Code(s):    --- Professional ---                       R13.10, Dysphagia, unspecified                       K22.2, Esophageal obstruction CPT copyright 2016 American Medical Association. All  rights reserved. The codes documented in this report are preliminary and upon coder review may  be revised to meet current compliance requirements. [name] MD, MD 04/15/2017 11:50:01 AM This report has been signed electronically. Number of Addenda: 0 Note Initiated On: 04/15/2017 11:40 AM      [facility]

## 2017-04-15 NOTE — Anesthesia Preprocedure Evaluation (Signed)
Anesthesia Evaluation  Patient identified by MRN, date of birth, ID band Patient awake    Reviewed: Allergy & Precautions, NPO status , Patient's Chart, lab work & pertinent test results, reviewed documented beta blocker date and time   Airway Mallampati: II  TM Distance: >3 FB     Dental  (+) Chipped   Pulmonary asthma ,           Cardiovascular hypertension, Pt. on medications and Pt. on home beta blockers + angina + CAD and + CABG       Neuro/Psych Anxiety    GI/Hepatic GERD  ,  Endo/Other    Renal/GU Renal disease     Musculoskeletal  (+) Arthritis ,   Abdominal   Peds  Hematology  (+) anemia ,   Anesthesia Other Findings   Reproductive/Obstetrics                             Anesthesia Physical Anesthesia Plan  ASA: III  Anesthesia Plan: General   Post-op Pain Management:    Induction: Intravenous  PONV Risk Score and Plan:   Airway Management Planned:   Additional Equipment:   Intra-op Plan:   Post-operative Plan:   Informed Consent: I have reviewed the patients History and Physical, chart, labs and discussed the procedure including the risks, benefits and alternatives for the proposed anesthesia with the patient or authorized representative who has indicated his/her understanding and acceptance.     Plan Discussed with: CRNA  Anesthesia Plan Comments:         Anesthesia Quick Evaluation

## 2017-04-15 NOTE — H&P (Signed)
Frank Lame, MD [facility] [address], [place], [place] [address] Phone:[phone] Fax : [phone]  Primary Care Physician:  [name], MD Primary Gastroenterologist:  Dr. Allen Norris  Pre-Procedure History & Physical: HPI:  Frank Mcdaniel is a 83 y.o. male is here for an endoscopy.   Past Medical History:  Diagnosis Date  . Anemia 09/01/2015  . Anxiety 09/11/2015  . Arthritis   . Asthma    as a teenager  . Asymptomatic Sinus Bradycardia   . Atypical chest pain    a. 08/2014  . BPH (benign prostatic hyperplasia)   . CKD (chronic kidney disease), stage III   . Coronary artery disease    a. 2000 s/p CABG;  b. 10/2008 Neg MV, EF 64%; c. cath 05/2015: LM 85% sev cal, pLAD-1 lesion 80%, pLAD-2 lesion 100% chronic, ostLCx 70% sev cal, OM4 90%, ostRCA 70%, mid RCA 90%, LIMA-LAD patent, VG-OM2 patent, VG-OM3 patent, VG-RPDA patent. No AS  . GERD (gastroesophageal reflux disease)   . H/O echocardiogram    a. 10/2005 Echo: nl EF.  [name] Hairy cell leukemia ([place])   . HOH (hard of hearing)    Bilateral hearing aids  . HTN (hypertension)   . Hyperlipidemia   . Hypertension   . Leukemia [facility])     Past Surgical History:  Procedure Laterality Date  . CARDIAC CATHETERIZATION N/A 05/26/2015   Procedure: Left Heart Cath;  Surgeon: [name], MD;  Location: [place] [facility];  Service: Cardiovascular;  Laterality: N/A;  . CARDIAC CATHETERIZATION N/A 05/26/2015   Procedure: Coronary/Graft Angiography;  Surgeon: [name], MD;  Location: [place] [facility];  Service: Cardiovascular;  Laterality: N/A;  . CATARACT EXTRACTION    . CORONARY ARTERY BYPASS GRAFT  2000  . ESOPHAGOGASTRODUODENOSCOPY N/A 02/18/2017   Procedure: ESOPHAGOGASTRODUODENOSCOPY (EGD) with removal of food bolus;  Surgeon: Frank Lame, MD;  Location: [facility];  Service: Endoscopy;  Laterality: N/A;  . ESOPHAGOGASTRODUODENOSCOPY (EGD) WITH PROPOFOL N/A 03/11/2017   Procedure:  ESOPHAGOGASTRODUODENOSCOPY (EGD) WITH PROPOFOL;  Surgeon: Frank Lame, MD;  Location: [facility] ENDOSCOPY;  Service: Endoscopy;  Laterality: N/A;  . EYE SURGERY Left    Cataract Extraction with IOL  . INGUINAL HERNIA REPAIR Left 06/24/2016   Procedure: HERNIA REPAIR INGUINAL ADULT;  Surgeon: [name], MD;  Location: [facility] ORS;  Service: General;  Laterality: Left;  . TONSILLECTOMY  1942   . UPPER GASTROINTESTINAL ENDOSCOPY  3 years ago    with Dilation.     Prior to Admission medications   Medication Sig Start Date End Date Taking? Authorizing Provider  aspirin 81 MG EC tablet Take 2 tablets (162 mg total) by mouth daily. 11/08/10  Yes Gollan, Kathlene November, MD  atorvastatin (LIPITOR) 40 MG tablet TAKE 1/2 TABLETS (20 MG TOTAL) BY MOUTH DAILY. 11/14/16  Yes [name], MD  Calcium Carbonate Antacid (TUMS CHEWY BITES PO) Take 1 tablet by mouth as needed.    Yes [provider]  isosorbide mononitrate (IMDUR) 30 MG 24 hr tablet Take 1 tablet (30 mg total) by mouth daily. 05/14/16  Yes Dunn, Areta Haber, PA-C  Multiple Vitamin (MULTIVITAMIN) tablet Take 1 tablet by mouth daily.     Yes [provider]  RANEXA 500 MG 12 hr tablet TAKE 1 TABLET BY MOUTH TWICE A DAY 12/24/16  Yes Gollan, Kathlene November, MD  ranitidine (ZANTAC) 150 MG tablet Take 150 mg by mouth 2 (two) times daily. Reported on 12/25/2015   Yes  [provider]  solifenacin (VESICARE) 10 MG tablet Take 10 mg by mouth daily.   Yes [provider]  tamsulosin (FLOMAX) 0.4 MG CAPS capsule TAKE 1 CAPSULE (0.4 MG TOTAL) BY MOUTH DAILY. 06/14/16  Yes [name], MD  polyethylene glycol [facility] / GLYCOLAX) packet Take 17 g by mouth daily as needed.     [provider]  prochlorperazine (COMPAZINE) 10 MG tablet TAKE 1 TABLET (10 MG TOTAL) BY MOUTH EVERY 6 (SIX) HOURS AS NEEDED FOR NAUSEA OR VOMITING. 03/19/16   [name], MD    Allergies as of 04/14/2017  . (No Known Allergies)     Family History  Problem Relation Age of Onset  . Hypertension Mother   . Arthritis Father   . Other Unknown        no premature CAD.  [name] Colon cancer Neg Hx   . Prostate cancer Neg Hx   . Stomach cancer Neg Hx     Social History   Social History  . Marital status: Widowed    Spouse name: N/A  . Number of children: 1  . Years of education: N/A   Occupational History  . retired    Social History Main Topics  . Smoking status: Never Smoker  . Smokeless tobacco: Never Used  . Alcohol use No  . Drug use: No  . Sexual activity: No   Other Topics Concern  . Not on file   Social History Narrative   Lives locally with son.  Fairly active around the house though does not routinely exercise.  Retired from [employer].    Review of Systems: See HPI, otherwise negative ROS  Physical Exam: There were no vitals taken for this visit. General:   Alert,  pleasant and cooperative in NAD Head:  Normocephalic and atraumatic. Neck:  Supple; no masses or thyromegaly. Lungs:  Clear throughout to auscultation.    Heart:  Regular rate and rhythm. Abdomen:  Soft, nontender and nondistended. Normal bowel sounds, without guarding, and without rebound.   Neurologic:  Alert and  oriented x4;  grossly normal neurologically.  Impression/Plan: BROGEN DUELL is here for an endoscopy to be performed for dysphagia  Risks, benefits, limitations, and alternatives regarding  endoscopy have been reviewed with the patient.  Questions have been answered.  All parties agreeable.   Frank Lame, MD  04/15/2017, 11:36 AM

## 2017-04-15 NOTE — Anesthesia Postprocedure Evaluation (Signed)
Anesthesia Post Note  Patient: Frank Mcdaniel  Procedure(s) Performed: Procedure(s) (LRB): ESOPHAGOGASTRODUODENOSCOPY (EGD) WITH PROPOFOL (N/A)  Patient location during evaluation: Endoscopy Anesthesia Type: General Level of consciousness: awake and alert Pain management: pain level controlled Vital Signs Assessment: post-procedure vital signs reviewed and stable Respiratory status: spontaneous breathing, nonlabored ventilation, respiratory function stable and patient connected to nasal cannula oxygen Cardiovascular status: blood pressure returned to baseline and stable Postop Assessment: no signs of nausea or vomiting Anesthetic complications: no     Last Vitals:  Vitals:   04/15/17 1211 04/15/17 1221  BP: (!) 141/69 (!) 149/64  Pulse: (!) 58 (!) 56  Resp: 19 (!) 22  Temp:    SpO2: 100% 100%    Last Pain:  Vitals:   04/15/17 1151  TempSrc: Axillary                 Kamalani Mastro S

## 2017-04-15 NOTE — Anesthesia Procedure Notes (Signed)
Performed by: Lance Muss Pre-anesthesia Checklist: Patient identified, Suction available, Emergency Drugs available, Patient being monitored and Timeout performed Patient Re-evaluated:Patient Re-evaluated prior to induction Oxygen Delivery Method: Nasal cannula Preoxygenation: Pre-oxygenation with 100% oxygen Induction Type: IV induction

## 2017-04-15 NOTE — Anesthesia Post-op Follow-up Note (Signed)
Anesthesia QCDR form completed.        

## 2017-04-16 ENCOUNTER — Encounter: Payer: Self-pay | Admitting: Gastroenterology

## 2017-04-18 ENCOUNTER — Ambulatory Visit: Payer: Medicare Other | Admitting: Family Medicine

## 2017-04-21 ENCOUNTER — Inpatient Hospital Stay: Payer: Medicare Other | Attending: Oncology

## 2017-04-21 DIAGNOSIS — C9141 Hairy cell leukemia, in remission: Secondary | ICD-10-CM | POA: Diagnosis present

## 2017-04-21 DIAGNOSIS — Z9221 Personal history of antineoplastic chemotherapy: Secondary | ICD-10-CM | POA: Diagnosis not present

## 2017-04-21 LAB — CBC WITH DIFFERENTIAL/PLATELET
Basophils Absolute: 0 10*3/uL (ref 0–0.1)
Basophils Relative: 0 %
EOS ABS: 0.3 10*3/uL (ref 0–0.7)
EOS PCT: 5 %
HCT: 34.2 % — ABNORMAL LOW (ref 40.0–52.0)
HEMOGLOBIN: 12 g/dL — AB (ref 13.0–18.0)
LYMPHS ABS: 0.6 10*3/uL — AB (ref 1.0–3.6)
Lymphocytes Relative: 10 %
MCH: 32.5 pg (ref 26.0–34.0)
MCHC: 35 g/dL (ref 32.0–36.0)
MCV: 92.8 fL (ref 80.0–100.0)
MONO ABS: 0.6 10*3/uL (ref 0.2–1.0)
MONOS PCT: 11 %
NEUTROS PCT: 74 %
Neutro Abs: 4.3 10*3/uL (ref 1.4–6.5)
Platelets: 180 10*3/uL (ref 150–440)
RBC: 3.69 MIL/uL — ABNORMAL LOW (ref 4.40–5.90)
RDW: 13.9 % (ref 11.5–14.5)
WBC: 5.7 10*3/uL (ref 3.8–10.6)

## 2017-04-23 ENCOUNTER — Ambulatory Visit: Payer: Medicare Other

## 2017-05-05 ENCOUNTER — Ambulatory Visit: Payer: Medicare Other | Admitting: Family Medicine

## 2017-05-05 ENCOUNTER — Encounter: Payer: Self-pay | Admitting: Family Medicine

## 2017-05-05 ENCOUNTER — Ambulatory Visit (INDEPENDENT_AMBULATORY_CARE_PROVIDER_SITE_OTHER): Payer: Medicare Other | Admitting: Family Medicine

## 2017-05-05 VITALS — BP 110/60 | HR 72 | Temp 98.4°F | Wt 153.2 lb

## 2017-05-05 DIAGNOSIS — Z1331 Encounter for screening for depression: Secondary | ICD-10-CM | POA: Diagnosis not present

## 2017-05-05 DIAGNOSIS — N400 Enlarged prostate without lower urinary tract symptoms: Secondary | ICD-10-CM | POA: Diagnosis not present

## 2017-05-05 DIAGNOSIS — K5900 Constipation, unspecified: Secondary | ICD-10-CM

## 2017-05-05 DIAGNOSIS — Z0000 Encounter for general adult medical examination without abnormal findings: Secondary | ICD-10-CM

## 2017-05-05 DIAGNOSIS — K219 Gastro-esophageal reflux disease without esophagitis: Secondary | ICD-10-CM

## 2017-05-05 DIAGNOSIS — I2510 Atherosclerotic heart disease of native coronary artery without angina pectoris: Secondary | ICD-10-CM | POA: Diagnosis not present

## 2017-05-05 NOTE — Assessment & Plan Note (Signed)
Asymptomatic.  Continue to monitor

## 2017-05-05 NOTE — Progress Notes (Signed)
  [name], MD Phone: [phone]  Frank Mcdaniel is a 83 y.o. male who presents today for follow-up.   BPH: taking Flomax and Vesicare. Followed by urology. Notes no issues of starting and stopping or straining. Notes this is well controlled.  GERD: Rare symptoms. Does not regularly take Zantac. Takes Tums as needed though rarely has to take these. He's had 2 occurrences with food getting stuck. He had 2 EGDs recently. Stenosis on the most recent one that was dilated. Follows with GI.  Constipation: Bowel movements have been better recently than prior. No abdominal pain or blood in his stool. He is trying to take fiber though the instructions advised not to take it within 2 hours of taking his medications. He has not taking MiraLAX daily.  CAD: No chest pain or shortness of breath. Taking Lipitor, aspirin, Ranexa, Imdur.  PMH: nonsmoker.   ROS see history of present illness  Objective  Physical Exam Vitals:   05/05/17 1448  BP: 110/60  Pulse: 72  Temp: 98.4 F (36.9 C)  SpO2: 98%    BP Readings from Last 3 Encounters:  05/05/17 110/60  04/15/17 (!) 149/64  04/13/17 (!) 154/69   Wt Readings from Last 3 Encounters:  05/05/17 153 lb 3.2 oz (69.5 kg)  04/13/17 140 lb (63.5 kg)  03/11/17 153 lb (69.4 kg)    Physical Exam  Constitutional: No distress.  Cardiovascular: Normal rate, regular rhythm and normal heart sounds.   Pulmonary/Chest: Effort normal and breath sounds normal.  Abdominal: Soft. Bowel sounds are normal. He exhibits no distension. There is no tenderness. There is no rebound and no guarding.  Musculoskeletal: He exhibits no edema.  Neurological: He is alert. Gait normal.  Skin: Skin is warm and dry. He is not diaphoretic.     Assessment/Plan: Please see individual problem list.  Coronary artery disease Asymptomatic. Continue to monitor.  GERD (gastroesophageal reflux disease) Fairly well-controlled. 2 recent episodes of food getting stuck  related to stricture and stenosis of the esophagus. Underwent dilatation by GI. He'll continue to monitor. Encouraged chewing his food quite well.  BPH (benign prostatic hyperplasia) Fairly well-controlled. Continue to follow with urology.  Constipation Continues to be an issue. He has good bowel movements and takes MiraLAX. Encouraged him to take this daily. He'll try to avoid the fiber supplement for now as it is difficult for him to take this given his medication schedule. He will monitor.  [name], MD [place]

## 2017-05-05 NOTE — Assessment & Plan Note (Signed)
Fairly well-controlled. Continue to follow with urology.

## 2017-05-05 NOTE — Progress Notes (Signed)
Subjective:   Frank Mcdaniel is a 83 y.o. male who presents for Medicare Annual/Subsequent preventive examination.  Review of Systems:  No ROS.  Medicare Wellness Visit. Additional risk factors are reflected in the social history. Cardiac Risk Factors include: advanced age (>19men, >4 women);male gender;hypertension     Objective:    Vitals: BP 110/60   Pulse 72   Temp 98.4 F (36.9 C) (Oral)   Wt 153 lb 3.2 oz (69.5 kg)   SpO2 98%   BMI 23.29 kg/m   Body mass index is 23.29 kg/m.  Tobacco History  Smoking Status  . Never Smoker  Smokeless Tobacco  . Never Used     Counseling given: Not Answered   Past Medical History:  Diagnosis Date  . Anemia 09/01/2015  . Anxiety 09/11/2015  . Arthritis   . Asthma    as a teenager  . Asymptomatic Sinus Bradycardia   . Atypical chest pain    a. 08/2014  . BPH (benign prostatic hyperplasia)   . CKD (chronic kidney disease), stage III ([place])   . Coronary artery disease    a. 2000 s/p CABG;  b. 10/2008 Neg MV, EF 64%; c. cath 05/2015: LM 85% sev cal, pLAD-1 lesion 80%, pLAD-2 lesion 100% chronic, ostLCx 70% sev cal, OM4 90%, ostRCA 70%, mid RCA 90%, LIMA-LAD patent, VG-OM2 patent, VG-OM3 patent, VG-RPDA patent. No AS  . GERD (gastroesophageal reflux disease)   . H/O echocardiogram    a. 10/2005 Echo: nl EF.  [name] Hairy cell leukemia ([place])   . HOH (hard of hearing)    Bilateral hearing aids  . HTN (hypertension)   . Hyperlipidemia   . Hypertension   . Leukemia [id number])    Past Surgical History:  Procedure Laterality Date  . CARDIAC CATHETERIZATION N/A 05/26/2015   Procedure: Left Heart Cath;  Surgeon: [name], MD;  Location: [place] [facility];  Service: Cardiovascular;  Laterality: N/A;  . CARDIAC CATHETERIZATION N/A 05/26/2015   Procedure: Coronary/Graft Angiography;  Surgeon: [name], MD;  Location: [place] [facility];  Service: Cardiovascular;  Laterality: N/A;  . CATARACT EXTRACTION    . CORONARY  ARTERY BYPASS GRAFT  2000  . ESOPHAGOGASTRODUODENOSCOPY N/A 02/18/2017   Procedure: ESOPHAGOGASTRODUODENOSCOPY (EGD) with removal of food bolus;  Surgeon: [name], MD;  Location: [facility] END[facility]COPY;  Service: Endoscopy;  Laterality: N/A;  . ESOPHAGOGASTRODUODENOSCOPY (EGD) WITH PROPOFOL N/A 03/11/2017   Procedure: ESOPHAGOGASTRODUODENOSCOPY (EGD) WITH PROPOFOL;  Surgeon: [name], MD;  Location: [facility] ENDOSCOPY;  Service: Endoscopy;  Laterality: N/A;  . ESOPHAGOGASTRODUODENOSCOPY (EGD) WITH PROPOFOL N/A 04/15/2017   Procedure: ESOPHAGOGASTRODUODENOSCOPY (EGD) WITH PROPOFOL;  Surgeon: [name], MD;  Location: [facility] ENDOSCOPY;  Service: Endoscopy;  Laterality: N/A;  . EYE SURGERY Left    Cataract Extraction with IOL  . INGUINAL HERNIA REPAIR Left 06/24/2016   Procedure: HERNIA REPAIR INGUINAL ADULT;  Surgeon: [name], MD;  Location: [facility] ORS;  Service: General;  Laterality: Left;  . TONSILLECTOMY  1942   . UPPER GASTROINTESTINAL ENDOSCOPY  3 years ago    with Dilation.    Family History  Problem Relation Age of Onset  . Hypertension Mother   . Arthritis Father   . Other Unknown        no premature CAD.  [name] Colon cancer Neg Hx   . Prostate cancer Neg Hx   . Stomach cancer Neg Hx    History  Sexual Activity  . Sexual activity: No  Outpatient Encounter Prescriptions as of 05/05/2017  Medication Sig  . aspirin 81 MG EC tablet Take 2 tablets (162 mg total) by mouth daily.  [name] atorvastatin (LIPITOR) 40 MG tablet TAKE 1/2 TABLETS (20 MG TOTAL) BY MOUTH DAILY.  . Calcium Carbonate Antacid (TUMS CHEWY BITES PO) Take 1 tablet by mouth as needed.   . isosorbide mononitrate (IMDUR) 30 MG 24 hr tablet Take 1 tablet (30 mg total) by mouth daily.  . Multiple Vitamin (MULTIVITAMIN) tablet Take 1 tablet by mouth daily.    . polyethylene glycol (MIRALAX / GLYCOLAX) packet Take 17 g by mouth daily as needed.   [name] RANEXA 500 MG 12 hr tablet TAKE 1 TABLET BY MOUTH TWICE A DAY  . ranitidine  (ZANTAC) 150 MG tablet Take 150 mg by mouth 2 (two) times daily. Reported on 12/25/2015  . solifenacin (VESICARE) 10 MG tablet Take 10 mg by mouth daily.  . tamsulosin (FLOMAX) 0.4 MG CAPS capsule TAKE 1 CAPSULE (0.4 MG TOTAL) BY MOUTH DAILY.  . [DISCONTINUED] prochlorperazine (COMPAZINE) 10 MG tablet TAKE 1 TABLET (10 MG TOTAL) BY MOUTH EVERY 6 (SIX) HOURS AS NEEDED FOR NAUSEA OR VOMITING.   No facility-administered encounter medications on file as of 05/05/2017.     Activities of Daily Living In your present state of health, do you have any difficulty performing the following activities: 05/05/2017 06/14/2016  Hearing? Y Y  Comment Hearing aids Bilateral Hearing Aids  Vision? N N  Difficulty concentrating or making decisions? Y N  Comment Difficulty remembering -  Walking or climbing stairs? N N  Dressing or bathing? N N  Doing errands, shopping? N -  Preparing Food and eating ? N -  Using the Toilet? N -  In the past six months, have you accidently leaked urine? N -  Do you have problems with loss of bowel control? N -  Managing your Medications? N -  Managing your Finances? Y -  Comment Son assists -  Housekeeping or managing your Housekeeping? N -  Some recent data might be hidden    Patient Care Team: [name], MD as PCP - General (Family Medicine) Rockey Situ, Kathlene November, MD as Consulting Physician (Cardiology) [name], MD as Consulting Physician (Oncology) [name] as Physician Assistant (Urology)   Assessment:    This is a routine wellness examination for [facility]. The goal of the wellness visit is to assist the patient how to close the gaps in care and create a preventative care plan for the patient.   The roster of all physicians providing medical care to patient is listed in the Snapshot section of the chart.  Osteoporosis risk reviewed.    Safety issues reviewed; Smoke and carbon monoxide detectors in the home. No firearms in the  home.  Wears seatbelts when driving or riding with others. Patient does wear sunscreen or protective clothing when in direct sunlight. No violence in the home.  Depression- PHQ 2 &9 complete.  No signs/symptoms or verbal communication regarding little pleasure in doing things, feeling down, depressed or hopeless. No changes in sleeping, energy, eating, concentrating.  No thoughts of self harm or harm towards others.  Time spent on this topic is 10 minutes.   Patient is alert, normal appearance, oriented to person/place/and time.  Correctly identified the president of the [place], recall of 1/3 words, and performing simple calculations. Displays appropriate judgement and can read correct time from watch face.   No new identified risk were noted.  No failures at ADL's or IADL's.    BMI- discussed the importance of a healthy diet, water intake and the benefits of aerobic exercise. Educational material provided.   24 hour diet recall: Breakfast: cereal, fruit, veggie sausage, grits Lunch: peanut butter nabs Dinner: chicken, rice  Daily fluid intake: 0 cups of caffeine,  1 cups of water, 4 cups decaf tea  Dental- every 6 months.  [place]- Visual acuity not assessed per patient preference since they have regular follow up with the ophthalmologist.  Wears corrective lenses.  Sleep patterns- Sleeps 7-8 hours at night.  Wakes feeling rested.   Influenza and Pneumonia vaccines discussed; he plans to follow up with his local pharmacy to verify previous dates.    TDAP vaccine deferred per patient preference.  Follow up with insurance.  Educational material provided.  Patient Concerns: None at this time. Follow up with PCP as needed.  Exercise Activities and Dietary recommendations Current Exercise Habits: Home exercise routine, Type of exercise: walking, Time (Minutes): 20, Frequency (Times/Week): 5, Weekly Exercise (Minutes/Week): 100, Intensity: Mild  Goals    . Increase water  intake          STOP DRINKING BEVERAGES 2 HOURS BEFORE BEDTIME AND DECREASE CAFFEINE INTAKE DURING THE DAY TO HELP WITH URINARY FREQUENCY AT NIGHT.       Fall Risk Fall Risk  05/05/2017 04/23/2016  Falls in the past year? No No   Depression Screen PHQ 2/9 Scores 05/05/2017 04/23/2016 01/16/2016  PHQ - 2 Score 0 0 0  PHQ- 9 Score 0 - -    Cognitive Function MMSE - Mini Mental State Exam 04/23/2016  Orientation to time 5  Orientation to Place 5  Registration 3  Attention/ Calculation 5  Recall 3  Language- name 2 objects 2  Language- repeat 1  Language- follow 3 step command 3  Language- read & follow direction 1  Write a sentence 1  Copy design 1  Total score 30     6CIT Screen 05/05/2017  What Year? 0 points  What month? 0 points  What time? 0 points  Count back from 20 0 points  Months in reverse 0 points  Repeat phrase 2 points  Total Score 2    Immunization History  Administered Date(s) Administered  . Influenza-Unspecified 04/05/2016   Screening Tests Health Maintenance  Topic Date Due  . TETANUS/TDAP  09[birth year]02/1953  . PNA vac Low Risk Adult (1 of 2 - PCV13) 09/03/1999  . INFLUENZA VACCINE  11/02/2017 (Originally 03/05/2017)      Plan:    End of life planning; Advance aging; Advanced directives discussed. Copy of current HCPOA/Living Will requested.    I have personally reviewed and noted the following in the patient's chart:   . Medical and social history . Use of alcohol, tobacco or illicit drugs  . Current medications and supplements . Functional ability and status . Nutritional status . Physical activity . Advanced directives . List of other physicians . Hospitalizations, surgeries, and ER visits in previous 12 months . Vitals . Screenings to include cognitive, depression, and falls . Referrals and appointments  In addition, I have reviewed and discussed with patient certain preventive protocols, quality metrics, and best practice recommendations.  A written personalized care plan for preventive services as well as general preventive health recommendations were provided to patient.     Varney Biles, LPN  14/04/7025

## 2017-05-05 NOTE — Assessment & Plan Note (Signed)
Continues to be an issue. He has good bowel movements and takes MiraLAX. Encouraged him to take this daily. He'll try to avoid the fiber supplement for now as it is difficult for him to take this given his medication schedule. He will monitor.

## 2017-05-05 NOTE — Assessment & Plan Note (Signed)
Fairly well-controlled. 2 recent episodes of food getting stuck related to stricture and stenosis of the esophagus. Underwent dilatation by GI. He'll continue to monitor. Encouraged chewing his food quite well.

## 2017-05-05 NOTE — Progress Notes (Deleted)
I have reviewed the above note and agree.  Selby Foisy, M.D.  

## 2017-05-05 NOTE — Patient Instructions (Addendum)
Nice to see you. You can try the MiraLAX daily for your constipation. You can try taking the fiber as well. If you have recurrent swallowing issues please be evaluated.   These are the goals we discussed: Goals    . Increase water intake          STOP DRINKING BEVERAGES 2 HOURS BEFORE BEDTIME AND DECREASE CAFFEINE INTAKE DURING THE DAY TO HELP WITH URINARY FREQUENCY AT NIGHT.        This is a list of the screening recommended for you and due dates:  Health Maintenance  Topic Date Due  . Tetanus Vaccine  09[birth year]02/1951  . Pneumonia vaccines (1 of 2 - PCV13) 09/03/1999  . Flu Shot  11/02/2017*  *Topic was postponed. The date shown is not the original due date.

## 2017-06-24 ENCOUNTER — Other Ambulatory Visit: Payer: Self-pay | Admitting: Physician Assistant

## 2017-06-24 ENCOUNTER — Other Ambulatory Visit: Payer: Self-pay | Admitting: Cardiovascular Disease

## 2017-07-01 ENCOUNTER — Ambulatory Visit: Payer: Medicare Other | Admitting: Family Medicine

## 2017-07-07 ENCOUNTER — Ambulatory Visit: Payer: Medicare Other

## 2017-07-14 ENCOUNTER — Inpatient Hospital Stay: Payer: Medicare Other | Admitting: Oncology

## 2017-07-14 ENCOUNTER — Inpatient Hospital Stay: Payer: Medicare Other

## 2017-07-14 ENCOUNTER — Ambulatory Visit: Payer: Medicare Other

## 2017-07-17 ENCOUNTER — Ambulatory Visit: Payer: Medicare Other | Admitting: Urology

## 2017-07-17 ENCOUNTER — Encounter: Payer: Self-pay | Admitting: Urology

## 2017-07-17 VITALS — BP 127/65 | HR 64 | Ht 68.0 in | Wt 152.0 lb

## 2017-07-17 DIAGNOSIS — N3281 Overactive bladder: Secondary | ICD-10-CM

## 2017-07-17 DIAGNOSIS — N400 Enlarged prostate without lower urinary tract symptoms: Secondary | ICD-10-CM | POA: Diagnosis not present

## 2017-07-17 NOTE — Progress Notes (Signed)
07/17/2017 10:09 AM   Frank Mcdaniel November 10, 1932 [id number]  Referring provider: [name], MD [address] [place], [place] [address]  Chief Complaint  Patient presents with  . Nocturia    HPI: The patient is a 84 year old gentleman with past medical history of BPH and overactive bladder who is on Flomax and Vesicare 10 mg.  He presents today for annual follow-up.  He does have significant history of nocturia.  He currently is at 1-2 times per night.  Is been as high as 9 or 10 times per night  prior to starting medication.  He has occasional urge with urge incontinence.  Interestingly this usually only occurs when he brushes his teeth.  He has a good stream he feels like he empties his bladder.  He is very happy with the medications he is on at this time.  No UTIs, gross hematuria, nephrolithiasis in the last year.   He does currently take MiraLAX for constipation as well.  At his last visit, PTNS was discussed with him however he was not interested.   PMH: Past Medical History:  Diagnosis Date  . Anemia 09/01/2015  . Anxiety 09/11/2015  . Arthritis   . Asthma    as a teenager  . Asymptomatic Sinus Bradycardia   . Atypical chest pain    a. 08/2014  . BPH (benign prostatic hyperplasia)   . CKD (chronic kidney disease), stage III ([place])   . Coronary artery disease    a. 2000 s/p CABG;  b. 10/2008 Neg MV, EF 64%; c. cath 05/2015: LM 85% sev cal, pLAD-1 lesion 80%, pLAD-2 lesion 100% chronic, ostLCx 70% sev cal, OM4 90%, ostRCA 70%, mid RCA 90%, LIMA-LAD patent, VG-OM2 patent, VG-OM3 patent, VG-RPDA patent. No AS  . GERD (gastroesophageal reflux disease)   . H/O echocardiogram    a. 10/2005 Echo: nl EF.  [name] Hairy cell leukemia ([place])   . HOH (hard of hearing)    Bilateral hearing aids  . HTN (hypertension)   . Hyperlipidemia   . Hypertension   . Leukemia [facility])     Surgical History: Past Surgical History:  Procedure Laterality Date  . CARDIAC  CATHETERIZATION N/A 05/26/2015   Procedure: Left Heart Cath;  Surgeon: [name], MD;  Location: [place] CV LAB;  Service: Cardiovascular;  Laterality: N/A;  . CARDIAC CATHETERIZATION N/A 05/26/2015   Procedure: Coronary/Graft Angiography;  Surgeon: [name], MD;  Location: [place] CV LAB;  Service: Cardiovascular;  Laterality: N/A;  . CATARACT EXTRACTION    . CORONARY ARTERY BYPASS GRAFT  2000  . ESOPHAGOGASTRODUODENOSCOPY N/A 02/18/2017   Procedure: ESOPHAGOGASTRODUODENOSCOPY (EGD) with removal of food bolus;  Surgeon: [name], MD;  Location[facility] ENDOSCOPY;  Service: Endoscopy;  Laterality: N/A;  . ESOPHAGOGASTRODUODENOSCOPY (EGD) WITH PROPOFOL N/A 03/11/2017   Procedure: ESOPHAGOGASTRODUODENOSCOPY (EGD) WITH PROPOFOL;  Surgeon: [name], MD;  Location: [facility] ENDOSCOPY;  Service: Endoscopy;  Laterality: N/A;  . ESOPHAGOGASTRODUODENOSCOPY (EGD) WITH PROPOFOL N/A 04/15/2017   Procedure: ESOPHAGOGASTRODUODENOSCOPY (EGD) WITH PROPOFOL;  Surgeon: [name], MD;  Location: [facility] ENDOSCOPY;  Service: Endoscopy;  Laterality: N/A;  . EYE SURGERY Left    Cataract Extraction with IOL  . INGUINAL HERNIA REPAIR Left 06/24/2016   Procedure: HERNIA REPAIR INGUINAL ADULT;  Surgeon: [name], MD;  Location: [facility] ORS;  Service: General;  Laterality: Left;  . TONSILLECTOMY  1942   . UPPER GASTROINTESTINAL ENDOSCOPY  3 years ago    with Dilation.  Home Medications:  Allergies as of 07/17/2017   No Known Allergies     Medication List        Accurate as of 07/17/17 10:09 AM. Always use your most recent med list.          aspirin 81 MG EC tablet Take 2 tablets (162 mg total) by mouth daily.   atorvastatin 40 MG tablet Commonly known as:  LIPITOR TAKE 1/2 TABLETS (20 MG TOTAL) BY MOUTH DAILY.   isosorbide mononitrate 30 MG 24 hr tablet Commonly known as:  IMDUR TAKE 1 TABLET (30 MG TOTAL) BY MOUTH DAILY.   multivitamin tablet Take 1 tablet by  mouth daily.   polyethylene glycol packet Commonly known as:  MIRALAX / GLYCOLAX Take 17 g by mouth daily.   RANEXA 500 MG 12 hr tablet Generic drug:  ranolazine TAKE 1 TABLET BY MOUTH TWICE A DAY   ranitidine 150 MG tablet Commonly known as:  ZANTAC Take 150 mg by mouth 2 (two) times daily. Reported on 12/25/2015   solifenacin 10 MG tablet Commonly known as:  VESICARE Take 10 mg by mouth daily.   tamsulosin 0.4 MG Caps capsule Commonly known as:  FLOMAX TAKE 1 CAPSULE (0.4 MG TOTAL) BY MOUTH DAILY.   TUMS CHEWY BITES PO Take 1 tablet by mouth as needed.       Allergies: No Known Allergies  Family History: Family History  Problem Relation Age of Onset  . Hypertension Mother   . Arthritis Father   . Other Unknown        no premature CAD.  [name] Colon cancer Neg Hx   . Prostate cancer Neg Hx   . Stomach cancer Neg Hx     Social History:  reports that  has never smoked. he has never used smokeless tobacco. He reports that he does not drink alcohol or use drugs.  ROS: UROLOGY Frequent Urination?: No Hard to postpone urination?: No Burning/pain with urination?: No Get up at night to urinate?: Yes Leakage of urine?: Yes Urine stream starts and stops?: No Trouble starting stream?: No Do you have to strain to urinate?: No Blood in urine?: No Urinary tract infection?: No Sexually transmitted disease?: No Injury to kidneys or bladder?: No Painful intercourse?: No Weak stream?: No Erection problems?: No Penile pain?: No  Gastrointestinal Nausea?: No Vomiting?: No Indigestion/heartburn?: No Diarrhea?: No Constipation?: Yes  Constitutional Fever: No Night sweats?: No Weight loss?: No Fatigue?: No  Skin Skin rash/lesions?: No Itching?: No  Eyes Blurred vision?: No Double vision?: No  Ears/Nose/Throat Sore throat?: No Sinus problems?: No  Hematologic/Lymphatic Swollen glands?: No Easy bruising?: No  Cardiovascular Leg swelling?: No Chest  pain?: No  Respiratory Cough?: No Shortness of breath?: No  Endocrine Excessive thirst?: No  Musculoskeletal Back pain?: No Joint pain?: No  Neurological Headaches?: No Dizziness?: No  Psychologic Depression?: No Anxiety?: No  Physical Exam: BP 127/65 (BP Location: Right Arm, Patient Position: Sitting, Cuff Size: Normal)   Pulse 64   Ht 5\' 8"  (1.727 m)   Wt 152 lb (68.9 kg)   BMI 23.11 kg/m   Constitutional:  Alert and oriented, No acute distress. HEENT: [place] AT, moist mucus membranes.  Trachea midline, no masses. Cardiovascular: No clubbing, cyanosis, or edema. Respiratory: Normal respiratory effort, no increased work of breathing. GI: Abdomen is soft, nontender, nondistended, no abdominal masses GU: No CVA tenderness.  Skin: No rashes, bruises or suspicious lesions. Lymph: No cervical or inguinal adenopathy. Neurologic: Grossly intact, no focal deficits,  moving all 4 extremities. Psychiatric: Normal mood and affect.  Laboratory Data: Lab Results  Component Value Date   WBC 5.7 04/21/2017   HGB 12.0 (L) 04/21/2017   HCT 34.2 (L) 04/21/2017   MCV 92.8 04/21/2017   PLT 180 04/21/2017    Lab Results  Component Value Date   CREATININE 1.38 (H) 02/18/2017    Lab Results  Component Value Date   PSA 2.21 12/21/2015    No results found for: TESTOSTERONE  No results found for: HGBA1C  Urinalysis    Component Value Date/Time   COLORURINE Yellow 09/01/2014 1142   APPEARANCEUR Clear 05/23/2016 1554   LABSPEC 1.013 09/01/2014 1142   PHURINE 5.0 09/01/2014 1142   GLUCOSEU Negative 05/23/2016 1554   GLUCOSEU Negative 09/01/2014 1142   HGBUR Negative 09/01/2014 1142   BILIRUBINUR Negative 05/23/2016 1554   BILIRUBINUR Negative 09/01/2014 1142   KETONESUR Negative 09/01/2014 1142   PROTEINUR Negative 05/23/2016 1554   PROTEINUR Negative 09/01/2014 1142   UROBILINOGEN 0.2 12/21/2015 1132   NITRITE Negative 05/23/2016 1554   NITRITE Negative 09/01/2014 1142    LEUKOCYTESUR Negative 05/23/2016 1554   LEUKOCYTESUR Negative 09/01/2014 1142    Assessment & Plan:    1.  BPH/OAB Continue Flomax and Vesicare 10 mg daily.  He will continue his MiraLAX for his constipation as prescribed by his PCP.  He will follow-up with [place] annually.  Return in about 1 year (around 07/17/2018).  Nickie Retort, MD  [facility] [address], [place], [place] [address] [phone]

## 2017-07-19 NOTE — Progress Notes (Signed)
[place]  [phone] [phone]  ID: Frank Mcdaniel OB: 01-Dec-1932  MR#: [id number]  NLZ#:[id number]  Patient Care Team: [name], MD as PCP - General (Family Medicine) Rockey Situ Kathlene November, MD as Consulting Physician (Cardiology) [name], MD as Consulting Physician (Oncology) [name] as Physician Assistant (Urology)  CHIEF COMPLAINT: Hairy cell leukemia in complete remission.  INTERVAL HISTORY: Patient returns to clinic today for repeat laboratory work and further evaluation. He continues to feel well and is asymptomatic. He does not complain of weakness or fatigue today.  He has no neurologic complaints. He denies any fevers. He continues to have occasional stomach discomfort which is relieved with Tums. He denies nausea, vomiting, diarrhea or constipation. He denies any chest pain or shortness of breath. He denies any weight loss. He has no urinary complaints. Patient offers no further specific complaints today.   REVIEW OF SYSTEMS:   Review of Systems  Constitutional: Negative for fever, malaise/fatigue and weight loss.  Respiratory: Negative.  Negative for cough and shortness of breath.   Cardiovascular: Negative for chest pain and leg swelling.  Gastrointestinal: Positive for heartburn. Negative for abdominal pain, nausea and vomiting.  Genitourinary: Negative.   Musculoskeletal: Positive for back pain.  Skin: Negative.   Neurological: Negative.  Negative for sensory change and weakness.  Psychiatric/Behavioral: Negative.  The patient is not nervous/anxious.     As per HPI. Otherwise, a complete review of systems is negative.  PAST MEDICAL HISTORY: Past Medical History:  Diagnosis Date  . Anemia 09/01/2015  . Anxiety 09/11/2015  . Arthritis   . Asthma    as a teenager  . Asymptomatic Sinus Bradycardia   . Atypical chest pain    a. 08/2014  . BPH (benign prostatic hyperplasia)   . CKD  (chronic kidney disease), stage III ([place])   . Coronary artery disease    a. 2000 s/p CABG;  b. 10/2008 Neg MV, EF 64%; c. cath 05/2015: LM 85% sev cal, pLAD-1 lesion 80%, pLAD-2 lesion 100% chronic, ostLCx 70% sev cal, OM4 90%, ostRCA 70%, mid RCA 90%, LIMA-LAD patent, VG-OM2 patent, VG-OM3 patent, VG-RPDA patent. No AS  . GERD (gastroesophageal reflux disease)   . H/O echocardiogram    a. 10/2005 Echo: nl EF.  [name] Hairy cell leukemia ([place])   . HOH (hard of hearing)    Bilateral hearing aids  . HTN (hypertension)   . Hyperlipidemia   . Hypertension   . Leukemia ([place])     PAST SURGICAL HISTORY: Past Surgical History:  Procedure Laterality Date  . CARDIAC CATHETERIZATION N/A 05/26/2015   Procedure: Left Heart Cath;  Surgeon: [name], MD;  Location: [place] CV LAB;  Service: Cardiovascular;  Laterality: N/A;  . CARDIAC CATHETERIZATION N/A 05/26/2015   Procedure: Coronary/Graft Angiography;  Surgeon: [name], MD;  Location: [place] CV LAB;  Service: Cardiovascular;  Laterality: N/A;  . CATARACT EXTRACTION    . CORONARY ARTERY BYPASS GRAFT  2000  . ESOPHAGOGASTRODUODENOSCOPY N/A 02/18/2017   Procedure: ESOPHAGOGASTRODUODENOSCOPY (EGD) with removal of food bolus;  Surgeon: [name], MD;  Location: [facility] ENDOSCOPY;  Service: Endoscopy;  Laterality: N/A;  . ESOPHAGOGASTRODUODENOSCOPY (EGD) WITH PROPOFOL N/A 03/11/2017   Procedure: ESOPHAGOGASTRODUODENOSCOPY (EGD) WITH PROPOFOL;  Surgeon: [name], MD;  Location: [facility] ENDOSCOPY;  Service: Endoscopy;  Laterality: N/A;  . ESOPHAGOGASTRODUODENOSCOPY (EGD) WITH PROPOFOL N/A 04/15/2017   Procedure: ESOPHAGOGASTRODUODENOSCOPY (EGD) WITH PROPOFOL;  Surgeon: [name], MD;  Location: [facility] ENDOSCOPY;  Service: Endoscopy;  Laterality: N/A;  . EYE SURGERY Left    Cataract Extraction with IOL  . INGUINAL HERNIA REPAIR Left 06/24/2016   Procedure: HERNIA REPAIR INGUINAL ADULT;  Surgeon: [name], MD;  Location:  [facility] ORS;  Service: General;  Laterality: Left;  . TONSILLECTOMY  1942   . UPPER GASTROINTESTINAL ENDOSCOPY  3 years ago    with Dilation.     FAMILY HISTORY Family History  Problem Relation Age of Onset  . Hypertension Mother   . Arthritis Father   . Other Unknown        no premature CAD.  [name] Colon cancer Neg Hx   . Prostate cancer Neg Hx   . Stomach cancer Neg Hx        ADVANCED DIRECTIVES:    HEALTH MAINTENANCE: Social History   Tobacco Use  . Smoking status: Never Smoker  . Smokeless tobacco: Never Used  Substance Use Topics  . Alcohol use: No  . Drug use: No     No Known Allergies  Current Outpatient Medications  Medication Sig Dispense Refill  . aspirin 81 MG EC tablet Take 2 tablets (162 mg total) by mouth daily. 30 tablet 0  . atorvastatin (LIPITOR) 40 MG tablet TAKE 1/2 TABLETS (20 MG TOTAL) BY MOUTH DAILY. 45 tablet 3  . Calcium Carbonate Antacid (TUMS CHEWY BITES PO) Take 1 tablet by mouth as needed.     . isosorbide mononitrate (IMDUR) 30 MG 24 hr tablet TAKE 1 TABLET (30 MG TOTAL) BY MOUTH DAILY. 90 tablet 0  . Multiple Vitamin (MULTIVITAMIN) tablet Take 1 tablet by mouth daily.      . polyethylene glycol (MIRALAX / GLYCOLAX) packet Take 17 g by mouth daily.     [name] RANEXA 500 MG 12 hr tablet TAKE 1 TABLET BY MOUTH TWICE A DAY 60 tablet 3  . ranitidine (ZANTAC) 150 MG tablet Take 150 mg by mouth 2 (two) times daily. Reported on 12/25/2015    . solifenacin (VESICARE) 10 MG tablet Take 10 mg by mouth daily.    . tamsulosin (FLOMAX) 0.4 MG CAPS capsule TAKE 1 CAPSULE (0.4 MG TOTAL) BY MOUTH DAILY. 30 capsule 3   No current facility-administered medications for this visit.     OBJECTIVE: Vitals:   07/22/17 1510  BP: (!) 156/57  Pulse: 67     Body mass index is 23.89 kg/m.    ECOG FS:1 - Symptomatic but completely ambulatory  General: Well-developed, well-nourished, no acute distress. Eyes: anicteric sclera. Lungs: Clear to auscultation  bilaterally. Heart: Regular rate and rhythm. No rubs, murmurs, or gallops. Abdomen: Soft, nontender, nondistended. No organomegaly noted, normoactive bowel sounds. Musculoskeletal: No edema, cyanosis, or clubbing. Neuro: Alert, answering all questions appropriately. Cranial nerves grossly intact. Skin: No rashes or petechiae noted. Psych: Normal affect.   LAB RESULTS:  Lab Results  Component Value Date   NA 138 02/18/2017   K 3.7 02/18/2017   CL 105 02/18/2017   CO2 26 02/18/2017   GLUCOSE 99 02/18/2017   BUN 21 (H) 02/18/2017   CREATININE 1.38 (H) 02/18/2017   CALCIUM 9.2 02/18/2017   PROT 6.7 02/18/2017   ALBUMIN 4.2 02/18/2017   AST 21 02/18/2017   ALT 10 (L) 02/18/2017   ALKPHOS 55 02/18/2017   BILITOT 0.7 02/18/2017   GFRNONAA 46 (L) 02/18/2017   GFRAA 53 (L) 02/18/2017    Lab Results  Component Value Date   WBC 5.7 07/22/2017   NEUTROABS 4.2 07/22/2017   HGB  12.3 (L) 07/22/2017   HCT 36.1 (L) 07/22/2017   MCV 94.7 07/22/2017   PLT 170 07/22/2017     STUDIES: No results found.  ASSESSMENT:  Hairy cell leukemia in complete remission.  PLAN:   1. Hairy cell leukemia in complete remission: Bone marrow biopsy in May 2017 revealed no evidence of disease. Patient initially received cladribine in 2006 and then was treated for a relapse in 2011 and again in 2014. Bone marrow biopsy at that time confirmed recurrence of disease with 80-90% hairy cell leukemia noted in his specimen. Patient completed cladribine 0.15 mg/kg on days 1 through 5 along with 8 weekly cycles of Rituxan on April 18, 2015. Bone marrow biopsy results after completion of 8 weeks of Rituxan without evidence of residual disease. No intervention is needed at this time. If there were suspicion of recurrence, patient will require a bone marrow biopsy to confirm. Return to clinic in 3 months with repeat laboratory work and then in 6 months for laboratory work and further evaluation. 2.  Thrombocytopenia: Resolved.  Patient had normal trilineage hematopoiesis in his bone marrow.  3. Weakness and fatigue: Resolved.  4. GERD: Continue with OTC Tums.  5.  Anemia: Mild, monitor.  Patient expressed understanding and was in agreement with this plan. He also understands that He can call clinic at any time with any questions, concerns, or complaints.    [name], MD 07/25/17 7:58 AM   ROS

## 2017-07-22 ENCOUNTER — Inpatient Hospital Stay: Payer: Medicare Other | Attending: Oncology

## 2017-07-22 ENCOUNTER — Encounter: Payer: Self-pay | Admitting: Oncology

## 2017-07-22 ENCOUNTER — Other Ambulatory Visit: Payer: Self-pay

## 2017-07-22 ENCOUNTER — Inpatient Hospital Stay (HOSPITAL_BASED_OUTPATIENT_CLINIC_OR_DEPARTMENT_OTHER): Payer: Medicare Other | Admitting: Oncology

## 2017-07-22 VITALS — BP 156/57 | HR 67 | Wt 157.1 lb

## 2017-07-22 DIAGNOSIS — Z951 Presence of aortocoronary bypass graft: Secondary | ICD-10-CM | POA: Insufficient documentation

## 2017-07-22 DIAGNOSIS — Z9225 Personal history of immunosupression therapy: Secondary | ICD-10-CM

## 2017-07-22 DIAGNOSIS — D649 Anemia, unspecified: Secondary | ICD-10-CM | POA: Insufficient documentation

## 2017-07-22 DIAGNOSIS — N183 Chronic kidney disease, stage 3 (moderate): Secondary | ICD-10-CM | POA: Insufficient documentation

## 2017-07-22 DIAGNOSIS — Z7982 Long term (current) use of aspirin: Secondary | ICD-10-CM

## 2017-07-22 DIAGNOSIS — K219 Gastro-esophageal reflux disease without esophagitis: Secondary | ICD-10-CM | POA: Insufficient documentation

## 2017-07-22 DIAGNOSIS — Z79899 Other long term (current) drug therapy: Secondary | ICD-10-CM | POA: Insufficient documentation

## 2017-07-22 DIAGNOSIS — C9141 Hairy cell leukemia, in remission: Secondary | ICD-10-CM

## 2017-07-22 DIAGNOSIS — N400 Enlarged prostate without lower urinary tract symptoms: Secondary | ICD-10-CM | POA: Insufficient documentation

## 2017-07-22 DIAGNOSIS — E785 Hyperlipidemia, unspecified: Secondary | ICD-10-CM

## 2017-07-22 DIAGNOSIS — M549 Dorsalgia, unspecified: Secondary | ICD-10-CM

## 2017-07-22 DIAGNOSIS — I2510 Atherosclerotic heart disease of native coronary artery without angina pectoris: Secondary | ICD-10-CM | POA: Diagnosis not present

## 2017-07-22 DIAGNOSIS — R12 Heartburn: Secondary | ICD-10-CM | POA: Insufficient documentation

## 2017-07-22 DIAGNOSIS — I129 Hypertensive chronic kidney disease with stage 1 through stage 4 chronic kidney disease, or unspecified chronic kidney disease: Secondary | ICD-10-CM | POA: Diagnosis not present

## 2017-07-22 LAB — CBC WITH DIFFERENTIAL/PLATELET
BASOS ABS: 0 10*3/uL (ref 0–0.1)
Basophils Relative: 0 %
EOS ABS: 0.3 10*3/uL (ref 0–0.7)
EOS PCT: 6 %
HCT: 36.1 % — ABNORMAL LOW (ref 40.0–52.0)
Hemoglobin: 12.3 g/dL — ABNORMAL LOW (ref 13.0–18.0)
Lymphocytes Relative: 11 %
Lymphs Abs: 0.6 10*3/uL — ABNORMAL LOW (ref 1.0–3.6)
MCH: 32.3 pg (ref 26.0–34.0)
MCHC: 34.1 g/dL (ref 32.0–36.0)
MCV: 94.7 fL (ref 80.0–100.0)
MONO ABS: 0.6 10*3/uL (ref 0.2–1.0)
Monocytes Relative: 11 %
Neutro Abs: 4.2 10*3/uL (ref 1.4–6.5)
Neutrophils Relative %: 72 %
PLATELETS: 170 10*3/uL (ref 150–440)
RBC: 3.82 MIL/uL — AB (ref 4.40–5.90)
RDW: 13.1 % (ref 11.5–14.5)
WBC: 5.7 10*3/uL (ref 3.8–10.6)

## 2017-08-12 DIAGNOSIS — R600 Localized edema: Secondary | ICD-10-CM | POA: Insufficient documentation

## 2017-08-12 NOTE — Progress Notes (Signed)
Cardiology Office Note  Date:  08/13/2017   ID:  Frank Mcdaniel, DOB 07-28-1933, MRN [id number]  PCP:  [name], MD   Chief Complaint  Patient presents with  . other    OD 6 month f/u no complaints today. Meds reviewed verbally with pt.    HPI:  84 year old gentleman with a history of  coronary artery disease, bypass in July 2000, hairy cell leukemia,   chronic anemia,  history of hyperlipidemia.  who presents for routine followup of his coronary artery disease.   In follow-up today he reports he feels well Denies any significant chest pain concerning for angina Complaining about cost of several of his medications Also concerned about Ranexa and side effect of constipation Asked to take MiraLAX daily for symptoms No regular exercise program Once a week or so  chronic lower extremity edema, worse on the right than the left. No recent falls  admission to the hospital June 2016 for shortness of breath, had a echocardiogram, stress test Showing no ischemia Diagnosed with asthma exacerbation  History of anemia,  periodic follow-up with Dr. [name]  Lives with son, does the house chores  Previously had rare left-sided chest pain, started on ranexa  No further episodes of chest discomfort  EKG on today's visit shows normal sinus rhythm with rate 57 bpm, right bundle branch block  Other past medical history  slipping on the ice in January 2016, possibly landed on his left arm, had chest pressure, described as a dullness in his left pectoral area. He went to the emergency room and evaluation showed negative cardiac enzymes, otherwise normal lab work including CBC, basic metabolic panel, EKG unchanged, chest x-ray normal. He was discharged home with follow-up today Possibly had one or 2 very short episodes of fleeting low-grade almost in the left chest. Again he wonders if it could be musculoskeletal from recent fall  Last stress test 01/2015 was a lexiscan,  ejection fraction 65%, no ischemia noted, no EKG changes    PMH:   has a past medical history of Anemia (09/01/2015), Anxiety (09/11/2015), Arthritis, Asthma, Asymptomatic Sinus Bradycardia, Atypical chest pain, BPH (benign prostatic hyperplasia), CKD (chronic kidney disease), stage III ([place]), Coronary artery disease, GERD (gastroesophageal reflux disease), H/O echocardiogram, Hairy cell leukemia ([place]), HOH (hard of hearing), HTN (hypertension), Hyperlipidemia, Hypertension, and Leukemia ([place]).  PSH:    Past Surgical History:  Procedure Laterality Date  . CARDIAC CATHETERIZATION N/A 05/26/2015   Procedure: Left Heart Cath;  Surgeon: [name], MD;  Location: [place] [facility];  Service: Cardiovascular;  Laterality: N/A;  . CARDIAC CATHETERIZATION N/A 05/26/2015   Procedure: Coronary/Graft Angiography;  Surgeon: [name], MD;  Location: [place] [facility];  Service: Cardiovascular;  Laterality: N/A;  . CATARACT EXTRACTION    . CORONARY ARTERY BYPASS GRAFT  2000  . ESOPHAGOGASTRODUODENOSCOPY N/A 02/18/2017   Procedure: ESOPHAGOGASTRODUODENOSCOPY (EGD) with removal of food bolus;  Surgeon: [name], MD;  Location: [facility];  Service: Endoscopy;  Laterality: N/A;  . ESOPHAGOGASTRODUODENOSCOPY (EGD) WITH PROPOFOL N/A 03/11/2017   Procedure: ESOPHAGOGASTRODUODENOSCOPY (EGD) WITH PROPOFOL;  Surgeon: [name], MD;  Location: [facility] ENDOSCOPY;  Service: Endoscopy;  Laterality: N/A;  . ESOPHAGOGASTRODUODENOSCOPY (EGD) WITH PROPOFOL N/A 04/15/2017   Procedure: ESOPHAGOGASTRODUODENOSCOPY (EGD) WITH PROPOFOL;  Surgeon: [name], MD;  Location: [facility] ENDOSCOPY;  Service: Endoscopy;  Laterality: N/A;  . EYE SURGERY Left    Cataract Extraction with IOL  . INGUINAL HERNIA REPAIR Left 06/24/2016   Procedure: HERNIA REPAIR  INGUINAL ADULT;  Surgeon: [name], MD;  Location: [facility] ORS;  Service: General;  Laterality: Left;  . TONSILLECTOMY  1942   . UPPER GASTROINTESTINAL  ENDOSCOPY  3 years ago    with Dilation.     Current Outpatient Medications  Medication Sig Dispense Refill  . aspirin 81 MG EC tablet Take 2 tablets (162 mg total) by mouth daily. 30 tablet 0  . atorvastatin (LIPITOR) 40 MG tablet TAKE 1/2 TABLETS (20 MG TOTAL) BY MOUTH DAILY. 45 tablet 3  . Calcium Carbonate Antacid (TUMS CHEWY BITES PO) Take 1 tablet by mouth as needed.     . isosorbide mononitrate (IMDUR) 30 MG 24 hr tablet TAKE 1 TABLET (30 MG TOTAL) BY MOUTH DAILY. 90 tablet 0  . Multiple Vitamin (MULTIVITAMIN) tablet Take 1 tablet by mouth daily.      . polyethylene glycol (MIRALAX / GLYCOLAX) packet Take 17 g by mouth daily.     [name] RANEXA 500 MG 12 hr tablet TAKE 1 TABLET BY MOUTH TWICE A DAY 60 tablet 3  . ranitidine (ZANTAC) 150 MG tablet Take 150 mg by mouth 2 (two) times daily. Reported on 12/25/2015    . solifenacin (VESICARE) 10 MG tablet Take 10 mg by mouth daily.    . tamsulosin (FLOMAX) 0.4 MG CAPS capsule TAKE 1 CAPSULE (0.4 MG TOTAL) BY MOUTH DAILY. 30 capsule 3   No current facility-administered medications for this visit.      Allergies:   Patient has no known allergies.   Social History:  The patient  reports that  has never smoked. he has never used smokeless tobacco. He reports that he does not drink alcohol or use drugs.   Family History:   family history includes Arthritis in his father; Hypertension in his mother; Other in his unknown relative.    Review of Systems: Review of Systems  Constitutional: Negative.   Respiratory: Negative.   Cardiovascular: Negative.   Gastrointestinal: Negative.        Constipation  Musculoskeletal: Negative.   Neurological: Negative.   Psychiatric/Behavioral: Negative.   All other systems reviewed and are negative.    PHYSICAL EXAM: VS:  BP 118/62 (BP Location: Left Arm, Patient Position: Sitting, Cuff Size: Normal)   Pulse (!) 57   Ht 5\' 8"  (1.727 m)   Wt 155 lb 8 oz (70.5 kg)   BMI 23.64 kg/m  , BMI Body mass  index is 23.64 kg/m. GEN: Well nourished, well developed, in no acute distress  HEENT: normal  Neck: no JVD, carotid bruits, or masses Cardiac: RRR; no murmurs, rubs, or gallops,no edema  Respiratory:  clear to auscultation bilaterally, normal work of breathing GI: soft, nontender, nondistended, + BS MS: no deformity or atrophy  Skin: warm and dry, no rash Neuro:  Strength and sensation are intact Psych: euthymic mood, full affect    Recent Labs: 02/18/2017: ALT 10; BUN 21; Creatinine, Ser 1.38; Potassium 3.7; Sodium 138 07/22/2017: Hemoglobin 12.3; Platelets 170    Lipid Panel Lab Results  Component Value Date   CHOL 142 05/24/2016   HDL 50.50 05/24/2016   LDLCALC 78 05/24/2016   TRIG 69.0 05/24/2016      Wt Readings from Last 3 Encounters:  08/13/17 155 lb 8 oz (70.5 kg)  07/22/17 157 lb 1.6 oz (71.3 kg)  07/17/17 152 lb (68.9 kg)       ASSESSMENT AND PLAN:  Pure hypercholesterolemia Cholesterol is at goal on the current lipid regimen. No changes to the  medications were made. Routine lab work through primary care  Essential hypertension Blood pressure is well controlled on today's visit. No changes made to the medications. Stable  Coronary artery disease involving native coronary artery of native heart without angina pectoris - Plan: EKG 12-Lead Currently with no symptoms of angina.  suggested he try to hold the Ranexa given his constipation and given the cost of the medication  If he has recurrent symptoms we would put him back on Ranexa  Angina pectoris (HCC) Currently symptoms are stable, denies having any anginal symptoms No testing at this time  Hairy cell leukemia, in remission ([place]) Followed by Dr. [name], stable  S/P CABG (coronary artery bypass graft)  no further testing at this time. Active without anginal symptoms    Total encounter time more than 25 minutes  Greater than 50% was spent in counseling and coordination of care with the  patient   Disposition:   F/U  12 months   Orders Placed This Encounter  Procedures  . EKG 12-Lead     Signed, Esmond Plants, M.D., Ph.D. 08/13/2017  [place], [place]

## 2017-08-13 ENCOUNTER — Encounter: Payer: Self-pay | Admitting: Cardiovascular Disease

## 2017-08-13 ENCOUNTER — Ambulatory Visit (INDEPENDENT_AMBULATORY_CARE_PROVIDER_SITE_OTHER): Payer: Medicare Other | Admitting: Cardiovascular Disease

## 2017-08-13 VITALS — BP 118/62 | HR 57 | Ht 68.0 in | Wt 155.5 lb

## 2017-08-13 DIAGNOSIS — I209 Angina pectoris, unspecified: Secondary | ICD-10-CM

## 2017-08-13 DIAGNOSIS — E782 Mixed hyperlipidemia: Secondary | ICD-10-CM | POA: Diagnosis not present

## 2017-08-13 DIAGNOSIS — I10 Essential (primary) hypertension: Secondary | ICD-10-CM | POA: Diagnosis not present

## 2017-08-13 DIAGNOSIS — C9141 Hairy cell leukemia, in remission: Secondary | ICD-10-CM

## 2017-08-13 DIAGNOSIS — R600 Localized edema: Secondary | ICD-10-CM

## 2017-08-13 DIAGNOSIS — Z951 Presence of aortocoronary bypass graft: Secondary | ICD-10-CM

## 2017-08-13 DIAGNOSIS — I25118 Atherosclerotic heart disease of native coronary artery with other forms of angina pectoris: Secondary | ICD-10-CM | POA: Diagnosis not present

## 2017-08-13 NOTE — Patient Instructions (Addendum)
Medication Instructions:   Ok to hold the ranexa if no chest pain  Labwork:  No new labs needed  Testing/Procedures:  No further testing at this time   Follow-Up: It was a pleasure seeing you in the office today. Please call us if you have new issues that need to be addressed before your next appt.  [phone]  Your physician wants you to follow-up in: 12 months.  You will receive a reminder letter in the mail two months in advance. If you don't receive a letter, please call our office to schedule the follow-up appointment.  If you need a refill on your cardiac medications before your next appointment, please call your pharmacy.

## 2017-09-21 ENCOUNTER — Other Ambulatory Visit: Payer: Self-pay | Admitting: Cardiovascular Disease

## 2017-10-16 ENCOUNTER — Other Ambulatory Visit: Payer: Self-pay | Admitting: Family Medicine

## 2017-10-16 MED ORDER — TAMSULOSIN HCL 0.4 MG PO CAPS: ORAL_CAPSULE | ORAL | 11 refills | Status: DC

## 2017-10-20 ENCOUNTER — Inpatient Hospital Stay: Payer: Medicare Other | Attending: Oncology

## 2017-10-20 DIAGNOSIS — C9141 Hairy cell leukemia, in remission: Secondary | ICD-10-CM | POA: Diagnosis not present

## 2017-10-20 LAB — CBC WITH DIFFERENTIAL/PLATELET
Basophils Absolute: 0 10*3/uL (ref 0–0.1)
Basophils Relative: 0 %
EOS ABS: 0.3 10*3/uL (ref 0–0.7)
EOS PCT: 5 %
HCT: 35.9 % — ABNORMAL LOW (ref 40.0–52.0)
Hemoglobin: 12.2 g/dL — ABNORMAL LOW (ref 13.0–18.0)
LYMPHS ABS: 0.7 10*3/uL — AB (ref 1.0–3.6)
LYMPHS PCT: 11 %
MCH: 31.8 pg (ref 26.0–34.0)
MCHC: 34 g/dL (ref 32.0–36.0)
MCV: 93.4 fL (ref 80.0–100.0)
MONO ABS: 0.7 10*3/uL (ref 0.2–1.0)
Monocytes Relative: 11 %
Neutro Abs: 4.4 10*3/uL (ref 1.4–6.5)
Neutrophils Relative %: 73 %
PLATELETS: 147 10*3/uL — AB (ref 150–440)
RBC: 3.85 MIL/uL — ABNORMAL LOW (ref 4.40–5.90)
RDW: 13.1 % (ref 11.5–14.5)
WBC: 6.1 10*3/uL (ref 3.8–10.6)

## 2017-11-03 ENCOUNTER — Ambulatory Visit: Payer: Medicare Other | Admitting: Family Medicine

## 2017-11-03 ENCOUNTER — Encounter: Payer: Self-pay | Admitting: Family Medicine

## 2017-11-03 ENCOUNTER — Other Ambulatory Visit: Payer: Self-pay

## 2017-11-03 VITALS — BP 116/62 | HR 54 | Temp 98.5°F | Ht 68.0 in | Wt 158.8 lb

## 2017-11-03 DIAGNOSIS — K5900 Constipation, unspecified: Secondary | ICD-10-CM

## 2017-11-03 DIAGNOSIS — E785 Hyperlipidemia, unspecified: Secondary | ICD-10-CM

## 2017-11-03 DIAGNOSIS — C9141 Hairy cell leukemia, in remission: Secondary | ICD-10-CM | POA: Diagnosis not present

## 2017-11-03 DIAGNOSIS — I2510 Atherosclerotic heart disease of native coronary artery without angina pectoris: Secondary | ICD-10-CM | POA: Diagnosis not present

## 2017-11-03 MED ORDER — TETANUS-DIPHTH-ACELL PERTUSSIS 5-2.5-18.5 LF-MCG/0.5 IM SUSP: 0.5000 mL | Freq: Once | INTRAMUSCULAR | 0 refills | Status: AC

## 2017-11-03 NOTE — Progress Notes (Signed)
  [name], MD Phone: [phone]  Frank Mcdaniel is a 84 y.o. male who presents today for f/u.  HYPERLIPIDEMIA Symptoms Chest pain on exertion:  no   Medications: Compliance- taking lipitor Right upper quadrant pain- no  Muscle aches- no  CAD: Patient notes no issues with chest pain, shortness of breath, or edema.  He has stopped the Ranexa at the advice of his cardiologist and has not had any symptoms.  He continues on Imdur.  Constipation: Patient notes he intermittently takes MiraLAX.  He will take this if he starts to feel stopped up.  This helps him go to the bathroom.  He typically has a bowel movement daily when he does take this.  No abdominal pain or blood in his stool.  No nausea or vomiting.  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  He notes he got a good report and recently had lab work that appears relatively stable.  Social History   Tobacco Use  Smoking Status Never Smoker  Smokeless Tobacco Never Used     ROS see history of present illness  Objective  Physical Exam Vitals:   11/03/17 1610  BP: 116/62  Pulse: (!) 54  Temp: 98.5 F (36.9 C)  SpO2: 97%    BP Readings from Last 3 Encounters:  11/03/17 116/62  08/13/17 118/62  07/22/17 (!) 156/57   Wt Readings from Last 3 Encounters:  11/03/17 158 lb 12.8 oz (72 kg)  08/13/17 155 lb 8 oz (70.5 kg)  07/22/17 157 lb 1.6 oz (71.3 kg)    Physical Exam  Constitutional: No distress.  Cardiovascular: Normal rate, regular rhythm and normal heart sounds.  Pulmonary/Chest: Effort normal and breath sounds normal.  Abdominal: Soft. Bowel sounds are normal. He exhibits no distension. There is no tenderness. There is no rebound and no guarding.  Musculoskeletal: He exhibits no edema.  Neurological: He is alert. Gait normal.  Skin: Skin is warm and dry. He is not diaphoretic.     Assessment/Plan: Please see individual problem list.  Coronary artery disease Asymptomatic.   Continue to monitor.  Hyperlipidemia Tolerating Lipitor.  Continue this.  Return for fasting labs.  Hairy cell leukemia Patient feels well.  Lab work relatively stable.  We will continue to follow with oncology.  Constipation Adequately controlled.  Continue current regimen.   Health Maintenance: We will request his pneumonia vaccine records from his pharmacy.  He is given a prescription for a Tdap.  Orders Placed This Encounter  Procedures  . Comp Met (CMET)    Standing Status:   Future    Standing Expiration Date:   11/04/2018  . Lipid panel    Standing Status:   Future    Standing Expiration Date:   11/04/2018    Meds ordered this encounter  Medications  . Tdap (BOOSTRIX) 5-2.5-18.5 LF-MCG/0.5 injection    Sig: Inject 0.5 mLs into the muscle once for 1 dose.    Dispense:  0.5 mL    Refill:  0     [name], MD [place]

## 2017-11-03 NOTE — Assessment & Plan Note (Signed)
Adequately controlled.  Continue current regimen. 

## 2017-11-03 NOTE — Assessment & Plan Note (Signed)
Asymptomatic.  Continue to monitor

## 2017-11-03 NOTE — Assessment & Plan Note (Addendum)
Tolerating Lipitor.  Continue this.  Return for fasting labs.

## 2017-11-03 NOTE — Patient Instructions (Signed)
Nice to see you. We will have you return for fasting lab work.

## 2017-11-03 NOTE — Assessment & Plan Note (Signed)
Patient feels well.  Lab work relatively stable.  We will continue to follow with oncology.

## 2017-11-06 ENCOUNTER — Other Ambulatory Visit (INDEPENDENT_AMBULATORY_CARE_PROVIDER_SITE_OTHER): Payer: Medicare Other

## 2017-11-06 DIAGNOSIS — E785 Hyperlipidemia, unspecified: Secondary | ICD-10-CM | POA: Diagnosis not present

## 2017-11-06 LAB — COMPREHENSIVE METABOLIC PANEL
ALK PHOS: 69 U/L (ref 39–117)
ALT: 13 U/L (ref 0–53)
AST: 24 U/L (ref 0–37)
Albumin: 4 g/dL (ref 3.5–5.2)
BILIRUBIN TOTAL: 0.5 mg/dL (ref 0.2–1.2)
BUN: 20 mg/dL (ref 6–23)
CALCIUM: 9.3 mg/dL (ref 8.4–10.5)
CO2: 25 meq/L (ref 19–32)
CREATININE: 1.38 mg/dL (ref 0.40–1.50)
Chloride: 106 mEq/L (ref 96–112)
GFR: 52.28 mL/min — AB (ref >60.00)
GLUCOSE: 94 mg/dL (ref 70–99)
Potassium: 4.3 mEq/L (ref 3.5–5.1)
Sodium: 139 mEq/L (ref 135–145)
TOTAL PROTEIN: 6.8 g/dL (ref 6.0–8.3)

## 2017-11-06 LAB — LIPID PANEL
CHOL/HDL RATIO: 2
Cholesterol: 123 mg/dL (ref 0–200)
HDL: 54.8 mg/dL (ref >39.00)
LDL Cholesterol: 54 mg/dL (ref 0–99)
NONHDL: 67.86
TRIGLYCERIDES: 67 mg/dL (ref 0.0–149.0)
VLDL: 13.4 mg/dL (ref 0.0–40.0)

## 2017-11-19 ENCOUNTER — Other Ambulatory Visit: Payer: Self-pay | Admitting: Family Medicine

## 2017-11-24 NOTE — Telephone Encounter (Signed)
Copied from [place] [id number]. Topic: Quick Communication - See Telephone Encounter >> Nov 24, 2017  9:33 AM Ether Griffins B wrote: CRM for notification. See Telephone encounter for: 11/24/17.  Pt contacted [facility] to refill his atorvastatin and they called and stated they are unable to refill the medication at this time. Pt would like to know why this medication cant be refilled.

## 2018-01-20 ENCOUNTER — Ambulatory Visit: Payer: Medicare Other | Admitting: Oncology

## 2018-01-20 ENCOUNTER — Other Ambulatory Visit: Payer: Medicare Other

## 2018-02-01 NOTE — Progress Notes (Signed)
[place]  [phone] [phone]  ID: Frank Mcdaniel OB: [birth year]  MR#: [id number]  XBW#:[id number]  Patient Care Team: [name], MD as PCP - General (Family Medicine) Rockey Situ Kathlene November, MD as Consulting Physician (Cardiology) [name], MD as Consulting Physician (Oncology) [name] as Physician Assistant (Urology)  CHIEF COMPLAINT: Hairy cell leukemia in complete remission.  INTERVAL HISTORY: Patient returns to clinic today for repeat laboratory work and routine six-month evaluation.  He continues to have problems with  indigestion, but otherwise feels well and is asymptomatic. He does not complain of weakness or fatigue today.  He has no neurologic complaints. He denies any fevers. He denies nausea, vomiting, diarrhea or constipation. He denies any chest pain or shortness of breath. He denies any weight loss. He has no urinary complaints.  Patient feels at his baseline offers no specific complaints today.  REVIEW OF SYSTEMS:   Review of Systems  Constitutional: Negative.  Negative for fever, malaise/fatigue and weight loss.  Respiratory: Negative.  Negative for cough and shortness of breath.   Cardiovascular: Negative.  Negative for chest pain and leg swelling.  Gastrointestinal: Positive for heartburn. Negative for abdominal pain, nausea and vomiting.  Genitourinary: Negative.   Musculoskeletal: Negative.  Negative for back pain.  Skin: Negative.   Neurological: Negative.  Negative for sensory change, focal weakness and weakness.  Psychiatric/Behavioral: Negative.  The patient is not nervous/anxious.     As per HPI. Otherwise, a complete review of systems is negative.  PAST MEDICAL HISTORY: Past Medical History:  Diagnosis Date  . Anemia 09/01/2015  . Anxiety 09/11/2015  . Arthritis   . Asthma    as a teenager  . Asymptomatic Sinus Bradycardia   . Atypical chest pain    a. 08/2014  . BPH  (benign prostatic hyperplasia)   . CKD (chronic kidney disease), stage III ([place])   . Coronary artery disease    a. 2000 s/p CABG;  b. 10/2008 Neg MV, EF 64%; c. cath 05/2015: LM 85% sev cal, pLAD-1 lesion 80%, pLAD-2 lesion 100% chronic, ostLCx 70% sev cal, OM4 90%, ostRCA 70%, mid RCA 90%, LIMA-LAD patent, VG-OM2 patent, VG-OM3 patent, VG-RPDA patent. No AS  . GERD (gastroesophageal reflux disease)   . H/O echocardiogram    a. 10/2005 Echo: nl EF.  [name] Hairy cell leukemia ([place])   . HOH (hard of hearing)    Bilateral hearing aids  . HTN (hypertension)   . Hyperlipidemia   . Hypertension   . Leukemia ([place])     PAST SURGICAL HISTORY: Past Surgical History:  Procedure Laterality Date  . CARDIAC CATHETERIZATION N/A 05/26/2015   Procedure: Left Heart Cath;  Surgeon: [name], MD;  Location: [place] [facility];  Service: Cardiovascular;  Laterality: N/A;  . CARDIAC CATHETERIZATION N/A 05/26/2015   Procedure: Coronary/Graft Angiography;  Surgeon: [name], MD;  Location: [place] [facility];  Service: Cardiovascular;  Laterality: N/A;  . CATARACT EXTRACTION    . CORONARY ARTERY BYPASS GRAFT  2000  . ESOPHAGOGASTRODUODENOSCOPY N/A 02/18/2017   Procedure: ESOPHAGOGASTRODUODENOSCOPY (EGD) with removal of food bolus;  Surgeon: [name], MD;  Location: [facility] ENDOSCOPY;  Service: Endoscopy;  Laterality: N/A;  . ESOPHAGOGASTRODUODENOSCOPY (EGD) WITH PROPOFOL N/A 03/11/2017   Procedure: ESOPHAGOGASTRODUODENOSCOPY (EGD) WITH PROPOFOL;  Surgeon: [name], MD;  Location: [facility] ENDOSCOPY;  Service: Endoscopy;  Laterality: N/A;  . ESOPHAGOGASTRODUODENOSCOPY (EGD) WITH PROPOFOL N/A 04/15/2017   Procedure: ESOPHAGOGASTRODUODENOSCOPY (EGD) WITH PROPOFOL;  Surgeon: [name], MD;  Location: [facility] ENDOSCOPY;  Service: Endoscopy;  Laterality: N/A;  . EYE SURGERY Left    Cataract Extraction with IOL  . INGUINAL HERNIA REPAIR Left 06/24/2016   Procedure: HERNIA REPAIR INGUINAL ADULT;   Surgeon: [name], MD;  Location: [facility] ORS;  Service: General;  Laterality: Left;  . TONSILLECTOMY  1942   . UPPER GASTROINTESTINAL ENDOSCOPY  3 years ago    with Dilation.     FAMILY HISTORY Family History  Problem Relation Age of Onset  . Hypertension Mother   . Arthritis Father   . Other Unknown        no premature CAD.  [name] Colon cancer Neg Hx   . Prostate cancer Neg Hx   . Stomach cancer Neg Hx        ADVANCED DIRECTIVES:    HEALTH MAINTENANCE: Social History   Tobacco Use  . Smoking status: Never Smoker  . Smokeless tobacco: Never Used  Substance Use Topics  . Alcohol use: No  . Drug use: No     No Known Allergies  Current Outpatient Medications  Medication Sig Dispense Refill  . aspirin 81 MG EC tablet Take 2 tablets (162 mg total) by mouth daily. 30 tablet 0  . atorvastatin (LIPITOR) 40 MG tablet TAKE 1/2 TABLETS (20 MG TOTAL) BY MOUTH DAILY. 45 tablet 3  . Calcium Carbonate Antacid (TUMS CHEWY BITES PO) Take 1 tablet by mouth as needed.     . isosorbide mononitrate (IMDUR) 30 MG 24 hr tablet Take 1 tablet (30 mg total) by mouth daily. 90 tablet 3  . Multiple Vitamin (MULTIVITAMIN) tablet Take 1 tablet by mouth daily.      . polyethylene glycol (MIRALAX / GLYCOLAX) packet Take 17 g by mouth daily.     . ranitidine (ZANTAC) 150 MG tablet Take 150 mg by mouth 2 (two) times daily. Reported on 12/25/2015    . tamsulosin (FLOMAX) 0.4 MG CAPS capsule TAKE 1 CAPSULE (0.4 MG TOTAL) BY MOUTH DAILY. 30 capsule 11   No current facility-administered medications for this visit.     OBJECTIVE: Vitals:   02/02/18 1054  BP: 133/78  Pulse: (!) 53  Resp: 18  Temp: 98.7 F (37.1 C)     Body mass index is 24.5 kg/m.    ECOG FS:1 - Symptomatic but completely ambulatory  General: Well-developed, well-nourished, no acute distress. Eyes: Pink conjunctiva, anicteric sclera. HEENT: Normocephalic, moist mucous membranes, clear oropharnyx. Lungs: Clear to  auscultation bilaterally. Heart: Regular rate and rhythm. No rubs, murmurs, or gallops. Abdomen: Soft, nontender, nondistended. No organomegaly noted, normoactive bowel sounds. Musculoskeletal: No edema, cyanosis, or clubbing. Neuro: Alert, answering all questions appropriately. Cranial nerves grossly intact. Skin: No rashes or petechiae noted. Psych: Normal affect.  LAB RESULTS:  Lab Results  Component Value Date   NA 139 11/06/2017   K 4.3 11/06/2017   CL 106 11/06/2017   CO2 25 11/06/2017   GLUCOSE 94 11/06/2017   BUN 20 11/06/2017   CREATININE 1.38 11/06/2017   CALCIUM 9.3 11/06/2017   PROT 6.8 11/06/2017   ALBUMIN 4.0 11/06/2017   AST 24 11/06/2017   ALT 13 11/06/2017   ALKPHOS 69 11/06/2017   BILITOT 0.5 11/06/2017   GFRNONAA 46 (L) 02/18/2017   GFRAA 53 (L) 02/18/2017    Lab Results  Component Value Date   WBC 7.0 02/02/2018   NEUTROABS 5.3 02/02/2018   HGB 12.8 (L) 02/02/2018   HCT 37.3 (L) 02/02/2018  MCV 92.5 02/02/2018   PLT 161 02/02/2018     STUDIES: No results found.  ASSESSMENT:  Hairy cell leukemia in complete remission.  PLAN:   1. Hairy cell leukemia in complete remission: Bone marrow biopsy in May 2017 revealed no evidence of disease. Patient initially received cladribine in 2006 and then was treated for a relapse in 2011 and again in 2014. Bone marrow biopsy at that time confirmed recurrence of disease with 80-90% hairy cell leukemia noted in his specimen. Patient completed cladribine 0.15 mg/kg on days 1 through 5 along with 8 weekly cycles of Rituxan on April 18, 2015. Bone marrow biopsy results after completion of 8 weeks of Rituxan without evidence of residual disease. No intervention is needed at this time. If there were suspicion of recurrence, patient will require a bone marrow biopsy to confirm.  CBC continues to essentially be within normal limits.  Return to clinic in 3 months for laboratory work only and then in 6 months for further  evaluation. 2. Thrombocytopenia: Resolved.  Patient had normal trilineage hematopoiesis in his bone marrow.  3. Weakness and fatigue: Patient does not complain of this today. 4. GERD: Continue over-the-counter Zantac and Tums as needed. 5.  Anemia: Hemoglobin 12.8.  Chronic and unchanged.  Monitor.  Patient expressed understanding and was in agreement with this plan. He also understands that He can call clinic at any time with any questions, concerns, or complaints.    [name], MD 02/02/18 1:16 PM   ROS

## 2018-02-02 ENCOUNTER — Inpatient Hospital Stay (HOSPITAL_BASED_OUTPATIENT_CLINIC_OR_DEPARTMENT_OTHER): Payer: Medicare Other | Admitting: Oncology

## 2018-02-02 ENCOUNTER — Inpatient Hospital Stay: Payer: Medicare Other | Attending: Oncology

## 2018-02-02 ENCOUNTER — Encounter: Payer: Self-pay | Admitting: Oncology

## 2018-02-02 VITALS — BP 133/78 | HR 53 | Temp 98.7°F | Resp 18 | Wt 161.1 lb

## 2018-02-02 DIAGNOSIS — C9141 Hairy cell leukemia, in remission: Secondary | ICD-10-CM

## 2018-02-02 DIAGNOSIS — I2510 Atherosclerotic heart disease of native coronary artery without angina pectoris: Secondary | ICD-10-CM | POA: Insufficient documentation

## 2018-02-02 DIAGNOSIS — N183 Chronic kidney disease, stage 3 (moderate): Secondary | ICD-10-CM | POA: Insufficient documentation

## 2018-02-02 DIAGNOSIS — C9140 Hairy cell leukemia not having achieved remission: Secondary | ICD-10-CM | POA: Diagnosis present

## 2018-02-02 DIAGNOSIS — D649 Anemia, unspecified: Secondary | ICD-10-CM

## 2018-02-02 DIAGNOSIS — Z951 Presence of aortocoronary bypass graft: Secondary | ICD-10-CM

## 2018-02-02 DIAGNOSIS — Z79899 Other long term (current) drug therapy: Secondary | ICD-10-CM | POA: Insufficient documentation

## 2018-02-02 DIAGNOSIS — Z7982 Long term (current) use of aspirin: Secondary | ICD-10-CM

## 2018-02-02 DIAGNOSIS — K219 Gastro-esophageal reflux disease without esophagitis: Secondary | ICD-10-CM | POA: Diagnosis not present

## 2018-02-02 DIAGNOSIS — I129 Hypertensive chronic kidney disease with stage 1 through stage 4 chronic kidney disease, or unspecified chronic kidney disease: Secondary | ICD-10-CM | POA: Diagnosis not present

## 2018-02-02 DIAGNOSIS — Z9225 Personal history of immunosupression therapy: Secondary | ICD-10-CM | POA: Insufficient documentation

## 2018-02-02 DIAGNOSIS — E785 Hyperlipidemia, unspecified: Secondary | ICD-10-CM | POA: Diagnosis not present

## 2018-02-02 LAB — CBC WITH DIFFERENTIAL/PLATELET
BASOS ABS: 0 10*3/uL (ref 0–0.1)
BASOS PCT: 0 %
EOS PCT: 3 %
Eosinophils Absolute: 0.2 10*3/uL (ref 0–0.7)
HEMATOCRIT: 37.3 % — AB (ref 40.0–52.0)
Hemoglobin: 12.8 g/dL — ABNORMAL LOW (ref 13.0–18.0)
Lymphocytes Relative: 12 %
Lymphs Abs: 0.8 10*3/uL — ABNORMAL LOW (ref 1.0–3.6)
MCH: 31.8 pg (ref 26.0–34.0)
MCHC: 34.4 g/dL (ref 32.0–36.0)
MCV: 92.5 fL (ref 80.0–100.0)
MONOS PCT: 10 %
Monocytes Absolute: 0.7 10*3/uL (ref 0.2–1.0)
Neutro Abs: 5.3 10*3/uL (ref 1.4–6.5)
Neutrophils Relative %: 75 %
PLATELETS: 161 10*3/uL (ref 150–440)
RBC: 4.03 MIL/uL — ABNORMAL LOW (ref 4.40–5.90)
RDW: 13.6 % (ref 11.5–14.5)
WBC: 7 10*3/uL (ref 3.8–10.6)

## 2018-02-02 NOTE — Progress Notes (Signed)
Patient denies any concerns today.  

## 2018-02-23 ENCOUNTER — Ambulatory Visit: Payer: Medicare Other | Admitting: Internal Medicine

## 2018-04-17 ENCOUNTER — Ambulatory Visit: Payer: Medicare Other | Admitting: Family Medicine

## 2018-05-05 ENCOUNTER — Ambulatory Visit: Payer: Medicare Other | Admitting: Family Medicine

## 2018-05-05 ENCOUNTER — Inpatient Hospital Stay: Payer: Medicare Other | Attending: Oncology

## 2018-05-05 DIAGNOSIS — C9141 Hairy cell leukemia, in remission: Secondary | ICD-10-CM | POA: Insufficient documentation

## 2018-05-05 DIAGNOSIS — Z9225 Personal history of immunosupression therapy: Secondary | ICD-10-CM | POA: Insufficient documentation

## 2018-05-05 LAB — CBC WITH DIFFERENTIAL/PLATELET
BASOS PCT: 0 %
Basophils Absolute: 0 10*3/uL (ref 0–0.1)
EOS ABS: 0.3 10*3/uL (ref 0–0.7)
Eosinophils Relative: 5 %
HCT: 36.5 % — ABNORMAL LOW (ref 40.0–52.0)
HEMOGLOBIN: 12.4 g/dL — AB (ref 13.0–18.0)
Lymphocytes Relative: 12 %
Lymphs Abs: 0.8 10*3/uL — ABNORMAL LOW (ref 1.0–3.6)
MCH: 31.5 pg (ref 26.0–34.0)
MCHC: 34.1 g/dL (ref 32.0–36.0)
MCV: 92.3 fL (ref 80.0–100.0)
Monocytes Absolute: 0.7 10*3/uL (ref 0.2–1.0)
Monocytes Relative: 10 %
NEUTROS PCT: 73 %
Neutro Abs: 5.1 10*3/uL (ref 1.4–6.5)
Platelets: 191 10*3/uL (ref 150–440)
RBC: 3.95 MIL/uL — ABNORMAL LOW (ref 4.40–5.90)
RDW: 13.4 % (ref 11.5–14.5)
WBC: 6.9 10*3/uL (ref 3.8–10.6)

## 2018-05-06 ENCOUNTER — Ambulatory Visit (INDEPENDENT_AMBULATORY_CARE_PROVIDER_SITE_OTHER): Payer: Medicare Other

## 2018-05-06 ENCOUNTER — Ambulatory Visit (INDEPENDENT_AMBULATORY_CARE_PROVIDER_SITE_OTHER): Payer: Medicare Other | Admitting: Family Medicine

## 2018-05-06 ENCOUNTER — Telehealth: Payer: Self-pay | Admitting: Family Medicine

## 2018-05-06 ENCOUNTER — Encounter: Payer: Self-pay | Admitting: Family Medicine

## 2018-05-06 VITALS — BP 110/64 | HR 81 | Temp 98.3°F | Resp 15 | Ht 64.5 in | Wt 172.6 lb

## 2018-05-06 DIAGNOSIS — K219 Gastro-esophageal reflux disease without esophagitis: Secondary | ICD-10-CM | POA: Diagnosis not present

## 2018-05-06 DIAGNOSIS — K5900 Constipation, unspecified: Secondary | ICD-10-CM

## 2018-05-06 DIAGNOSIS — Z0000 Encounter for general adult medical examination without abnormal findings: Secondary | ICD-10-CM

## 2018-05-06 DIAGNOSIS — E782 Mixed hyperlipidemia: Secondary | ICD-10-CM | POA: Diagnosis not present

## 2018-05-06 DIAGNOSIS — N401 Enlarged prostate with lower urinary tract symptoms: Secondary | ICD-10-CM

## 2018-05-06 DIAGNOSIS — R351 Nocturia: Secondary | ICD-10-CM

## 2018-05-06 DIAGNOSIS — M545 Low back pain: Secondary | ICD-10-CM

## 2018-05-06 DIAGNOSIS — G8929 Other chronic pain: Secondary | ICD-10-CM

## 2018-05-06 NOTE — Assessment & Plan Note (Signed)
Continue MiraLAX as needed 

## 2018-05-06 NOTE — Patient Instructions (Addendum)
  Frank Mcdaniel , Thank you for taking time to come for your Medicare Wellness Visit. I appreciate your ongoing commitment to your health goals. Please review the following plan we discussed and let me know if I can assist you in the future.   Follow up as needed.    Bring a copy of your [place] and/or Living Will to be scanned into chart once completed.  Have a great day!  These are the goals we discussed: Goals    . Increase physical activity     Walk for exercise       This is a list of the screening recommended for you and due dates:  Health Maintenance  Topic Date Due  . Tetanus Vaccine  09[birth year]02/1953  . Pneumonia vaccines (1 of 2 - PCV13) 09/03/1999  . Flu Shot  Completed

## 2018-05-06 NOTE — Progress Notes (Signed)
  [name], MD Phone: [phone]  Frank Mcdaniel is a 84 y.o. male who presents today for f/u.  CC: hld, gerd, BPH, low back pain  HYPERLIPIDEMIA Symptoms Chest pain on exertion:  no    Medications: Compliance- taking lipitor Right upper quadrant pain- no  Muscle aches- no  GERD:   Reflux symptoms: no   Abd pain: no   Blood in stool: no  Dysphagia: no   EGD: last year  Medication: OTC antacid after meals  BPH: Strain- no Flow- good Frequency- some Urgency- some Nocturia- 2x/night Emptying bladder- mostly Medication- flomax  Chronic low back pain: This is a intermittent issue.  Only mildly bothersome.  No radiation.  No medications for this.  No numbness or weakness.  No incontinence.  Patient does have some issues with constipation.  Typically has a bowel movement daily.  He states if things feel tight he will take MiraLAX and this helps.    Social History   Tobacco Use  Smoking Status Never Smoker  Smokeless Tobacco Never Used     ROS see history of present illness  Objective  Physical Exam Vitals:   05/06/18 1406  BP: 120/62  Pulse: 60  Temp: 98.2 F (36.8 C)  SpO2: 96%    BP Readings from Last 3 Encounters:  05/06/18 120/62  02/02/18 133/78  11/03/17 116/62   Wt Readings from Last 3 Encounters:  05/06/18 164 lb 12.8 oz (74.8 kg)  02/02/18 161 lb 1.6 oz (73.1 kg)  11/03/17 158 lb 12.8 oz (72 kg)    Physical Exam  Constitutional: No distress.  Cardiovascular: Normal rate, regular rhythm and normal heart sounds.  Pulmonary/Chest: Effort normal and breath sounds normal.  Abdominal: Soft. Bowel sounds are normal. He exhibits no distension. There is no tenderness. There is no rebound and no guarding.  Musculoskeletal: He exhibits no edema.  Neurological: He is alert.  Skin: Skin is warm and dry. He is not diaphoretic.     Assessment/Plan: Please see individual problem list.  BPH (benign prostatic hyperplasia) Fairly  well-controlled.  He will continue to follow with urology.  GERD (gastroesophageal reflux disease) Does not report any symptoms.  He will monitor for recurrence of symptoms.  Constipation Continue MiraLAX as needed.  Hyperlipidemia Continue Lipitor.  Cholesterol is very well controlled previously.  Bilateral low back pain without sciatica Stable.  He will monitor.  Given exercises to complete.   Health Maintenance: We are contacting his pharmacy to determine the status of pneumonia vaccination.  No orders of the defined types were placed in this encounter.   No orders of the defined types were placed in this encounter.    [name], MD [place]

## 2018-05-06 NOTE — Telephone Encounter (Signed)
Pt wanted to know if he needs to get the Pneumonia shot? Please advise and Thank you!

## 2018-05-06 NOTE — Assessment & Plan Note (Signed)
Does not report any symptoms.  He will monitor for recurrence of symptoms.

## 2018-05-06 NOTE — Assessment & Plan Note (Signed)
Continue Lipitor.  Cholesterol is very well controlled previously.

## 2018-05-06 NOTE — Assessment & Plan Note (Signed)
Fairly well-controlled.  He will continue to follow with urology.

## 2018-05-06 NOTE — Assessment & Plan Note (Signed)
Stable.  He will monitor.  Given exercises to complete.

## 2018-05-06 NOTE — Patient Instructions (Addendum)
Nice to see you. Please do the exercises for your back. Please monitor your constipation and if it worsens please let us know.  If your reflux worsens please let us know.   Back Exercises If you have pain in your back, do these exercises 2-3 times each day or as told by your doctor. When the pain goes away, do the exercises once each day, but repeat the steps more times for each exercise (do more repetitions). If you do not have pain in your back, do these exercises once each day or as told by your doctor. Exercises Single Knee to Chest  Do these steps 3-5 times in a row for each leg: 1. Lie on your back on a firm bed or the floor with your legs stretched out. 2. Bring one knee to your chest. 3. Hold your knee to your chest by grabbing your knee or thigh. 4. Pull on your knee until you feel a gentle stretch in your lower back. 5. Keep doing the stretch for 10-30 seconds. 6. Slowly let go of your leg and straighten it.  Pelvic Tilt  Do these steps 5-10 times in a row: 1. Lie on your back on a firm bed or the floor with your legs stretched out. 2. Bend your knees so they point up to the ceiling. Your feet should be flat on the floor. 3. Tighten your lower belly (abdomen) muscles to press your lower back against the floor. This will make your tailbone point up to the ceiling instead of pointing down to your feet or the floor. 4. Stay in this position for 5-10 seconds while you gently tighten your muscles and breathe evenly.  Cat-Cow  Do these steps until your lower back bends more easily: 1. Get on your hands and knees on a firm surface. Keep your hands under your shoulders, and keep your knees under your hips. You may put padding under your knees. 2. Let your head hang down, and make your tailbone point down to the floor so your lower back is round like the back of a cat. 3. Stay in this position for 5 seconds. 4. Slowly lift your head and make your tailbone point up to the ceiling so  your back hangs low (sags) like the back of a cow. 5. Stay in this position for 5 seconds.  Press-Ups  Do these steps 5-10 times in a row: 1. Lie on your belly (face-down) on the floor. 2. Place your hands near your head, about shoulder-width apart. 3. While you keep your back relaxed and keep your hips on the floor, slowly straighten your arms to raise the top half of your body and lift your shoulders. Do not use your back muscles. To make yourself more comfortable, you may change where you place your hands. 4. Stay in this position for 5 seconds. 5. Slowly return to lying flat on the floor.  Bridges  Do these steps 10 times in a row: 1. Lie on your back on a firm surface. 2. Bend your knees so they point up to the ceiling. Your feet should be flat on the floor. 3. Tighten your butt muscles and lift your butt off of the floor until your waist is almost as high as your knees. If you do not feel the muscles working in your butt and the back of your thighs, slide your feet 1-2 inches farther away from your butt. 4. Stay in this position for 3-5 seconds. 5. Slowly lower your butt to  the floor, and let your butt muscles relax.  If this exercise is too easy, try doing it with your arms crossed over your chest. Belly Crunches  Do these steps 5-10 times in a row: 1. Lie on your back on a firm bed or the floor with your legs stretched out. 2. Bend your knees so they point up to the ceiling. Your feet should be flat on the floor. 3. Cross your arms over your chest. 4. Tip your chin a little bit toward your chest but do not bend your neck. 5. Tighten your belly muscles and slowly raise your chest just enough to lift your shoulder blades a tiny bit off of the floor. 6. Slowly lower your chest and your head to the floor.  Back Lifts Do these steps 5-10 times in a row: 1. Lie on your belly (face-down) with your arms at your sides, and rest your forehead on the floor. 2. Tighten the muscles in  your legs and your butt. 3. Slowly lift your chest off of the floor while you keep your hips on the floor. Keep the back of your head in line with the curve in your back. Look at the floor while you do this. 4. Stay in this position for 3-5 seconds. 5. Slowly lower your chest and your face to the floor.  Contact a doctor if:  Your back pain gets a lot worse when you do an exercise.  Your back pain does not lessen 2 hours after you exercise. If you have any of these problems, stop doing the exercises. Do not do them again unless your doctor says it is okay. Get help right away if:  You have sudden, very bad back pain. If this happens, stop doing the exercises. Do not do them again unless your doctor says it is okay. This information is not intended to replace advice given to you by your health care provider. Make sure you discuss any questions you have with your health care provider. Document Released: 08/24/2010 Document Revised: 12/28/2015 Document Reviewed: 09/15/2014 Elsevier Interactive Patient Education  [employer].

## 2018-05-06 NOTE — Progress Notes (Signed)
Subjective:   Frank Mcdaniel is a 84 y.o. male who presents for Medicare Annual/Subsequent preventive examination.  Review of Systems:  No ROS.  Medicare Wellness Visit. Additional risk factors are reflected in the social history.  Cardiac Risk Factors include: advanced age (>80men, >14 women);male gender     Objective:    Vitals: BP 110/64 (BP Location: Left Arm, Patient Position: Sitting, Cuff Size: Normal)   Pulse 81   Temp 98.3 F (36.8 C) (Oral)   Resp 15   Ht 5' 4.5" (1.638 m)   Wt 172 lb 9.6 oz (78.3 kg)   SpO2 98%   BMI 29.17 kg/m   Body mass index is 29.17 kg/m.  Advanced Directives 05/06/2018 07/22/2017 05/05/2017 04/15/2017 04/13/2017 03/11/2017 01/20/2017  Does Patient Have a Medical Advance Directive? No Yes Yes Yes Yes Yes Yes  Type of Advance Directive - [place];Living will [place];Living will [place];Living will [place];Living will [place];Living will [place];Living will  Does patient want to make changes to medical advance directive? Yes (MAU/Ambulatory/Procedural Areas - Information given) No - Patient declined No - Patient declined - - - -  Copy of Healthcare Power of Attorney in Chart? - No - copy requested No - copy requested No - copy requested - No - copy requested No - copy requested  Would patient like information on creating a medical advance directive? - - - - - - -    Tobacco Social History   Tobacco Use  Smoking Status Never Smoker  Smokeless Tobacco Never Used     Counseling given: Not Answered   Clinical Intake:  Pre-visit preparation completed: Yes  Pain : No/denies pain     Nutritional Status: BMI 25 -29 Overweight Diabetes: No  How often do you need to have someone help you when you read instructions, pamphlets, or other written materials from your doctor or pharmacy?: 1 - Never  Interpreter Needed?:  No     Past Medical History:  Diagnosis Date  . Anemia 09/01/2015  . Anxiety 09/11/2015  . Arthritis   . Asthma    as a teenager  . Asymptomatic Sinus Bradycardia   . Atypical chest pain    a. 08/2014  . BPH (benign prostatic hyperplasia)   . CKD (chronic kidney disease), stage III ([place])   . Coronary artery disease    a. 2000 s/p CABG;  b. 10/2008 Neg MV, EF 64%; c. cath 05/2015: LM 85% sev cal, pLAD-1 lesion 80%, pLAD-2 lesion 100% chronic, ostLCx 70% sev cal, OM4 90%, ostRCA 70%, mid RCA 90%, LIMA-LAD patent, VG-OM2 patent, VG-OM3 patent, VG-RPDA patent. No AS  . GERD (gastroesophageal reflux disease)   . H/O echocardiogram    a. 10/2005 Echo: nl EF.  [name] Hairy cell leukemia ([place])   . HOH (hard of hearing)    Bilateral hearing aids  . HTN (hypertension)   . Hyperlipidemia   . Hypertension   . Leukemia [facility])    Past Surgical History:  Procedure Laterality Date  . CARDIAC CATHETERIZATION N/A 05/26/2015   Procedure: Left Heart Cath;  Surgeon: [name], MD;  Location: [place] [facility];  Service: Cardiovascular;  Laterality: N/A;  . CARDIAC CATHETERIZATION N/A 05/26/2015   Procedure: Coronary/Graft Angiography;  Surgeon: [name], MD;  Location: [place] [facility];  Service: Cardiovascular;  Laterality: N/A;  . CATARACT EXTRACTION    . CORONARY ARTERY BYPASS GRAFT  2000  . ESOPHAGOGASTRODUODENOSCOPY N/A 02/18/2017   Procedure: ESOPHAGOGASTRODUODENOSCOPY (EGD) with removal of food bolus;  Surgeon: [name], MD;  Location: [facility] ENDOSCOPY;  Service: Endoscopy;  Laterality: N/A;  . ESOPHAGOGASTRODUODENOSCOPY (EGD) WITH PROPOFOL N/A 03/11/2017   Procedure: ESOPHAGOGASTRODUODENOSCOPY (EGD) WITH PROPOFOL;  Surgeon: [name], MD;  Location: [facility] ENDOSCOPY;  Service: Endoscopy;  Laterality: N/A;  . ESOPHAGOGASTRODUODENOSCOPY (EGD) WITH PROPOFOL N/A 04/15/2017   Procedure: ESOPHAGOGASTRODUODENOSCOPY (EGD) WITH PROPOFOL;  Surgeon: [name], MD;  Location: [facility]  ENDOSCOPY;  Service: Endoscopy;  Laterality: N/A;  . EYE SURGERY Left    Cataract Extraction with IOL  . INGUINAL HERNIA REPAIR Left 06/24/2016   Procedure: HERNIA REPAIR INGUINAL ADULT;  Surgeon: [name], MD;  Location: [facility] ORS;  Service: General;  Laterality: Left;  . TONSILLECTOMY  1942   . UPPER GASTROINTESTINAL ENDOSCOPY  3 years ago    with Dilation.    Family History  Problem Relation Age of Onset  . Hypertension Mother   . Arthritis Father   . Other Unknown        no premature CAD.  [name] Colon cancer Neg Hx   . Prostate cancer Neg Hx   . Stomach cancer Neg Hx    Social History   Socioeconomic History  . Marital status: Widowed    Spouse name: Not on file  . Number of children: 1  . Years of education: Not on file  . Highest education level: Not on file  Occupational History  . Occupation: retired  Scientific laboratory technician  . Financial resource strain: Not hard at all  . Food insecurity:    Worry: Never true    Inability: Never true  . Transportation needs:    Medical: No    Non-medical: No  Tobacco Use  . Smoking status: Never Smoker  . Smokeless tobacco: Never Used  Substance and Sexual Activity  . Alcohol use: No  . Drug use: No  . Sexual activity: Never  Lifestyle  . Physical activity:    Days per week: 0 days    Minutes per session: Not on file  . Stress: Not on file  Relationships  . Social connections:    Talks on phone: Not on file    Gets together: Not on file    Attends religious service: Not on file    Active member of club or organization: Not on file    Attends meetings of clubs or organizations: Not on file    Relationship status: Not on file  Other Topics Concern  . Not on file  Social History Narrative   Lives locally with son.  Fairly active around the house though does not routinely exercise.  Retired from [employer].    Outpatient Encounter Medications as of 05/06/2018  Medication Sig  . aspirin 81 MG EC tablet Take 2 tablets (162 mg  total) by mouth daily.  [name] atorvastatin (LIPITOR) 40 MG tablet TAKE 1/2 TABLETS (20 MG TOTAL) BY MOUTH DAILY.  . Calcium Carbonate Antacid (TUMS CHEWY BITES PO) Take 1 tablet by mouth as needed.   . isosorbide mononitrate (IMDUR) 30 MG 24 hr tablet Take 1 tablet (30 mg total) by mouth daily.  . Multiple Vitamin (MULTIVITAMIN) tablet Take 1 tablet by mouth daily.    . polyethylene glycol (MIRALAX / GLYCOLAX) packet Take 17 g by mouth daily.   . tamsulosin (FLOMAX) 0.4 MG CAPS capsule TAKE 1 CAPSULE (0.4 MG TOTAL) BY MOUTH DAILY.   No facility-administered encounter medications on file  as of 05/06/2018.     Activities of Daily Living In your present state of health, do you have any difficulty performing the following activities: 05/06/2018  Hearing? Y  Comment Hearing aid, bilateral  Vision? N  Difficulty concentrating or making decisions? Y  Comment Memory delay with recall  Walking or climbing stairs? N  Dressing or bathing? N  Doing errands, shopping? N  Preparing Food and eating ? Y  Comment Son cooks. Self feeds.   Using the Toilet? N  In the past six months, have you accidently leaked urine? N  Do you have problems with loss of bowel control? N  Managing your Medications? N  Managing your Finances? Y  Comment Son assists as needed  Housekeeping or managing your Housekeeping? N  Some recent data might be hidden    Patient Care Team: [name], MD as PCP - General (Family Medicine) Rockey Situ, Kathlene November, MD as Consulting Physician (Cardiology) [name], MD as Consulting Physician (Oncology) [name] as Physician Assistant (Urology)   Assessment:   This is a routine wellness examination for Amaan.  The goal of the wellness visit is to assist the patient how to close the gaps in care and create a preventative care plan for the patient.   Safety issues reviewed; Shares living quarters with his son.  Smoke and carbon monoxide detectors in the  home. Firearm safety discussed. Wears seatbelts when driving or riding with others. No violence in the home.  They do not have excessive sun exposure.  Discussed the need for sun protection: hats, long sleeves and the use of sunscreen if there is significant sun exposure.  Patient is alert, normal appearance, oriented to person/place/and time.  Correctly identified the president of the [place] and recalls of 2/3 words.  Performs simple calculations and can read correct time from watch face.  Displays appropriate judgement.  No new identified risk were noted.  No failures at ADL's or IADL's.    BMI- discussed the importance of a healthy diet, water intake and the benefits of aerobic exercise.His son prepares the meals and he tries to have a healthy diet and adequate fluid intake. He does not have an exercise regimen but plans to stay active and walk.  Dental- every 12 months.  Sleep patterns- Sleeps without issues.   Awaiting follow up for pneumococcal vaccine.   TDAP vaccine deferred per patient preference.  Follow up with insurance.  Educational material provided.  Keep all routine maintenance appointments.   Exercise Activities and Dietary recommendations Current Exercise Habits: The patient does not participate in regular exercise at present  Goals    . Increase physical activity     Walk for exercise       Fall Risk Fall Risk  05/06/2018 05/05/2017 04/23/2016  Falls in the past year? No No No   Depression Screen PHQ 2/9 Scores 05/06/2018 05/05/2017 04/23/2016 01/16/2016  PHQ - 2 Score 0 0 0 0  PHQ- 9 Score - 0 - -    Cognitive Function MMSE - Mini Mental State Exam 04/23/2016  Orientation to time 5  Orientation to Place 5  Registration 3  Attention/ Calculation 5  Recall 3  Language- name 2 objects 2  Language- repeat 1  Language- follow 3 step command 3  Language- read & follow direction 1  Write a sentence 1  Copy design 1  Total score 30     6CIT Screen  05/06/2018 05/05/2017  What Year? 0 points  0 points  What month? 0 points 0 points  What time? 0 points 0 points  Count back from 20 0 points 0 points  Months in reverse 0 points 0 points  Repeat phrase 0 points 2 points  Total Score 0 2    Immunization History  Administered Date(s) Administered  . Influenza, High Dose Seasonal PF 04/23/2018  . Influenza-Unspecified 04/05/2016, 05/12/2017   Screening Tests Health Maintenance  Topic Date Due  . TETANUS/TDAP  09/02/1953  . PNA vac Low Risk Adult (1 of 2 - PCV13) 09/03/1999  . INFLUENZA VACCINE  Completed      Plan:   End of life planning; Advance aging; Advanced directives discussed. Copy of current HCPOA/Living Will requested upon completion.    I have personally reviewed and noted the following in the patient's chart:   . Medical and social history . Use of alcohol, tobacco or illicit drugs  . Current medications and supplements . Functional ability and status . Nutritional status . Physical activity . Advanced directives . List of other physicians . Hospitalizations, surgeries, and ER visits in previous 12 months . Vitals . Screenings to include cognitive, depression, and falls . Referrals and appointments  In addition, I have reviewed and discussed with patient certain preventive protocols, quality metrics, and best practice recommendations. A written personalized care plan for preventive services as well as general preventive health recommendations were provided to patient.     Varney Biles, LPN  23/12/3612

## 2018-05-07 NOTE — Progress Notes (Signed)
I have reviewed the above note and agree.  Symiah Nowotny, M.D.  

## 2018-05-07 NOTE — Telephone Encounter (Signed)
Ok. Thanks!

## 2018-05-07 NOTE — Telephone Encounter (Signed)
Called pt's pharmacy and left a VM to call us to advise if pt has received this vaccination or not or to fax [place] the patient immunization record left our fax number as well. Will fax the pharmacy as well with pt's name and DOB request vaccination records.   Called pt and left a detailed VM that we are wait back to hear from his pharmacy.

## 2018-05-08 NOTE — Telephone Encounter (Signed)
[facility] [place] [facility] Pt has received the following vaccinations there:  Prevnar 13 11/03/2017   Booster Tdap 11/03/2017   Done at [place]. Has been updated in pt's chart. Sent to PCP pt is due for pneumococcal 23.   Sent to PCP for approval to call to schedule pt for a NV.   Thanks

## 2018-07-16 ENCOUNTER — Ambulatory Visit: Payer: Medicare Other | Admitting: Urology

## 2018-07-16 ENCOUNTER — Encounter: Payer: Self-pay | Admitting: Urology

## 2018-07-16 VITALS — BP 128/76 | HR 58 | Ht 64.5 in | Wt 169.2 lb

## 2018-07-16 DIAGNOSIS — N400 Enlarged prostate without lower urinary tract symptoms: Secondary | ICD-10-CM | POA: Diagnosis not present

## 2018-07-16 DIAGNOSIS — N3281 Overactive bladder: Secondary | ICD-10-CM

## 2018-07-16 LAB — URINALYSIS, COMPLETE
Bilirubin, UA: NEGATIVE
Glucose, UA: NEGATIVE
KETONES UA: NEGATIVE
LEUKOCYTES UA: NEGATIVE
Nitrite, UA: NEGATIVE
PH UA: 6 (ref 5.0–7.5)
Protein, UA: NEGATIVE
RBC, UA: NEGATIVE
Specific Gravity, UA: 1.025 (ref 1.005–1.030)
Urobilinogen, Ur: 0.2 mg/dL (ref 0.2–1.0)

## 2018-07-16 LAB — MICROSCOPIC EXAMINATION
EPITHELIAL CELLS (NON RENAL): NONE SEEN /HPF (ref 0–10)
RBC, UA: NONE SEEN /hpf (ref 0–2)
WBC UA: NONE SEEN /HPF (ref 0–5)

## 2018-07-16 MED ORDER — TAMSULOSIN HCL 0.4 MG PO CAPS: ORAL_CAPSULE | ORAL | 11 refills | Status: DC

## 2018-07-16 NOTE — Progress Notes (Signed)
   07/16/2018 5:21 PM   Sencere Erasmo Leventhal 01-20-1933 [id number]  Reason for visit: Follow up BPH/LUTS  HPI: I saw Mr. Cantera in urology clinic today for the first time in follow-up for BPH and LUTS.  He was previously followed by Dr. Pilar Jarvis.  He is a relatively healthy, though frail-appearing 84 year old male with a long history of mild to moderate urinary symptoms.  On chart review, he was previously on Flomax and Vesicare, however it seems that the [place] has since been stopped.  This is certainly very reasonable with his advanced age and high fall risk.  Overall, he is doing very well from a urinary perspective and his only real complaint is nocturia 1-2 times per night.  His IPSS score in clinic today is 4, with quality of life mostly satisfied.  He does not have any gross hematuria or urinary tract infections.  There are no aggravating or alleviating factors.  He continues to take Flomax.  Severity is mild.   ROS: Please see flowsheet from today's date for complete review of systems.  Physical Exam: BP 128/76 (BP Location: Left Arm, Patient Position: Sitting, Cuff Size: Normal)   Pulse (!) 58   Ht 5' 4.5" (1.638 m)   Wt 169 lb 3.2 oz (76.7 kg)   BMI 28.59 kg/m    Constitutional:  Alert and oriented, No acute distress. Respiratory: Normal respiratory effort, no increased work of breathing. GI: Abdomen is soft, nontender, nondistended, no abdominal masses GU: No CVA tenderness Skin: No rashes, bruises or suspicious lesions. Neurologic: Grossly intact, no focal deficits, moving all 4 extremities. Psychiatric: Normal mood and affect  Assessment & Plan:   In summary, Mr. Crampton is an 84 year old male with mild BPH and urinary symptoms, with primary complaint of nocturia 1-2 times per night.  He continues to take Flomax.  We discussed behavioral strategies at length including minimizing fluids in the evening, and double voiding prior to bed.  He feels he is overall doing very well,  and would like to follow-up with his PCP to refill his Flomax.  We are happy to see him again in the future if he develops worsening symptoms  A total of 15 minutes were spent face-to-face with the patient, greater than 50% was spent in patient education, counseling, and coordination of care regarding BPH, nocturia, urinary symptoms.  Billey Co, [facility] [address] [place], [place] [address] [phone]

## 2018-08-07 DIAGNOSIS — H903 Sensorineural hearing loss, bilateral: Secondary | ICD-10-CM | POA: Diagnosis not present

## 2018-08-07 NOTE — Progress Notes (Signed)
[place]  [phone] [phone]  ID: Frank Mcdaniel OB: 01-13-1933  MR#: [id number]  CVE#:[id number]  Patient Care Team: [name], MD as PCP - General (Family Medicine) Rockey Situ Kathlene November, MD as Consulting Physician (Cardiology) [name], MD as Consulting Physician (Oncology) [name] as Physician Assistant (Urology)  CHIEF COMPLAINT: Hairy cell leukemia in complete remission.  INTERVAL HISTORY: Patient returns to clinic today for repeat laboratory work and routine 50-monthevaluation.  He currently feels well and is asymptomatic. He does not complain of weakness or fatigue today.  He has no neurologic complaints.  He denies any recent fevers or illnesses.  He denies any chest pain or shortness of breath.  He denies nausea, vomiting, diarrhea or constipation.  He has no urinary complaints.  Patient feels at his baseline offers no specific complaints today.  REVIEW OF SYSTEMS:   Review of Systems  Constitutional: Negative.  Negative for fever, malaise/fatigue and weight loss.  Respiratory: Negative.  Negative for cough and shortness of breath.   Cardiovascular: Negative.  Negative for chest pain and leg swelling.  Gastrointestinal: Negative.  Negative for abdominal pain, heartburn, nausea and vomiting.  Genitourinary: Negative.  Negative for dysuria.  Musculoskeletal: Negative.  Negative for back pain.  Skin: Negative.  Negative for rash.  Neurological: Negative.  Negative for sensory change, focal weakness, weakness and headaches.  Psychiatric/Behavioral: Negative.  The patient is not nervous/anxious.     As per HPI. Otherwise, a complete review of systems is negative.  PAST MEDICAL HISTORY: Past Medical History:  Diagnosis Date  . Anemia 09/01/2015  . Anxiety 09/11/2015  . Arthritis   . Asthma    as a teenager  . Asymptomatic Sinus Bradycardia   . Atypical chest pain    a. 08/2014  . BPH (benign  prostatic hyperplasia)   . CKD (chronic kidney disease), stage III ([place]   . Coronary artery disease    a. 2000 s/p CABG;  b. 10/2008 Neg MV, EF 64%; c. cath 05/2015: LM 85% sev cal, pLAD-1 lesion 80%, pLAD-2 lesion 100% chronic, ostLCx 70% sev cal, OM4 90%, ostRCA 70%, mid RCA 90%, LIMA-LAD patent, VG-OM2 patent, VG-OM3 patent, VG-RPDA patent. No AS  . GERD (gastroesophageal reflux disease)   . H/O echocardiogram    a. 10/2005 Echo: nl EF.  .Marland KitchenHairy cell leukemia ([place]   . HOH (hard of hearing)    Bilateral hearing aids  . HTN (hypertension)   . Hyperlipidemia   . Hypertension   . Leukemia ([place]     PAST SURGICAL HISTORY: Past Surgical History:  Procedure Laterality Date  . CARDIAC CATHETERIZATION N/A 05/26/2015   Procedure: Left Heart Cath;  Surgeon: TMinna Merritts MD;  [facility];  Service: Cardiovascular;  Laterality: N/A;  . CARDIAC CATHETERIZATION N/A 05/26/2015   Procedure: Coronary/Graft Angiography;  Surgeon: TMinna Merritts MD;  [facility];  Service: Cardiovascular;  Laterality: N/A;  . CATARACT EXTRACTION    . CORONARY ARTERY BYPASS GRAFT  2000  . ESOPHAGOGASTRODUODENOSCOPY N/A 02/18/2017   Procedure: ESOPHAGOGASTRODUODENOSCOPY (EGD) with removal of food bolus;  Surgeon: WLucilla Lame MD;  [facility];  Service: Endoscopy;  Laterality: N/A;  . ESOPHAGOGASTRODUODENOSCOPY (EGD) WITH PROPOFOL N/A 03/11/2017   Procedure: ESOPHAGOGASTRODUODENOSCOPY (EGD) WITH PROPOFOL;  Surgeon: WLucilla Lame MD;  Location: [facility] ENDOSCOPY;  Service: Endoscopy;  Laterality: N/A;  . ESOPHAGOGASTRODUODENOSCOPY (EGD) WITH PROPOFOL N/A 04/15/2017   Procedure: ESOPHAGOGASTRODUODENOSCOPY (EGD) WITH PROPOFOL;  Surgeon: [name], MD;  Location: [facility] ENDOSCOPY;  Service: Endoscopy;  Laterality: N/A;  . EYE SURGERY Left    Cataract Extraction with IOL  . INGUINAL HERNIA REPAIR Left 06/24/2016   Procedure: HERNIA REPAIR INGUINAL ADULT;  Surgeon:  [name], MD;  Location: [facility] ORS;  Service: General;  Laterality: Left;  . TONSILLECTOMY  1942   . UPPER GASTROINTESTINAL ENDOSCOPY  3 years ago    with Dilation.     FAMILY HISTORY Family History  Problem Relation Age of Onset  . Hypertension Mother   . Arthritis Father   . Other Unknown        no premature CAD.  [name] Colon cancer Neg Hx   . Prostate cancer Neg Hx   . Stomach cancer Neg Hx        ADVANCED DIRECTIVES:    HEALTH MAINTENANCE: Social History   Tobacco Use  . Smoking status: Never Smoker  . Smokeless tobacco: Never Used  Substance Use Topics  . Alcohol use: No  . Drug use: No     No Known Allergies  Current Outpatient Medications  Medication Sig Dispense Refill  . aspirin 81 MG EC tablet Take 2 tablets (162 mg total) by mouth daily. 30 tablet 0  . atorvastatin (LIPITOR) 40 MG tablet TAKE 1/2 TABLETS (20 MG TOTAL) BY MOUTH DAILY. 45 tablet 3  . Calcium Carbonate Antacid (TUMS CHEWY BITES PO) Take 1 tablet by mouth as needed.     . isosorbide mononitrate (IMDUR) 30 MG 24 hr tablet Take 1 tablet (30 mg total) by mouth daily. 90 tablet 3  . Multiple Vitamin (MULTIVITAMIN) tablet Take 1 tablet by mouth daily.      . polyethylene glycol (MIRALAX / GLYCOLAX) packet Take 17 g by mouth daily as needed.     . tamsulosin (FLOMAX) 0.4 MG CAPS capsule TAKE 1 CAPSULE (0.4 MG TOTAL) BY MOUTH DAILY. 30 capsule 11   No current facility-administered medications for this visit.     OBJECTIVE: Vitals:   08/11/18 1445  BP: 131/65  Pulse: 62  Temp: 97.8 F (36.6 C)     Body mass index is 28.22 kg/m.    ECOG FS:0 - Asymptomatic  General: Well-developed, well-nourished, no acute distress. Eyes: Pink conjunctiva, anicteric sclera. HEENT: Normocephalic, moist mucous membranes. Lungs: Clear to auscultation bilaterally. Heart: Regular rate and rhythm. No rubs, murmurs, or gallops. Abdomen: Soft, nontender, nondistended. No organomegaly noted, normoactive bowel  sounds. Musculoskeletal: No edema, cyanosis, or clubbing. Neuro: Alert, answering all questions appropriately. Cranial nerves grossly intact. Skin: No rashes or petechiae noted. Psych: Normal affect.  LAB RESULTS:  Lab Results  Component Value Date   NA 139 11/06/2017   K 4.3 11/06/2017   CL 106 11/06/2017   CO2 25 11/06/2017   GLUCOSE 94 11/06/2017   BUN 20 11/06/2017   CREATININE 1.38 11/06/2017   CALCIUM 9.3 11/06/2017   PROT 6.8 11/06/2017   ALBUMIN 4.0 11/06/2017   AST 24 11/06/2017   ALT 13 11/06/2017   ALKPHOS 69 11/06/2017   BILITOT 0.5 11/06/2017   GFRNONAA 46 (L) 02/18/2017   GFRAA 53 (L) 02/18/2017    Lab Results  Component Value Date   WBC 6.3 08/11/2018   NEUTROABS 4.4 08/11/2018   HGB 13.0 08/11/2018   HCT 39.8 08/11/2018   MCV 93.0 08/11/2018   PLT 182 08/11/2018     STUDIES: No results found.  ASSESSMENT:  Hairy cell leukemia in complete remission.  PLAN:   1.  Hairy cell leukemia in complete remission: Bone marrow biopsy in May 2017 revealed no evidence of disease. Patient initially received cladribine in 2006 and then was treated for a relapse in 2011 and again in 2014. Bone marrow biopsy at that time confirmed recurrence of disease with 80-90% hairy cell leukemia noted in his specimen. Patient completed cladribine 0.15 mg/kg on days 1 through 5 along with 8 weekly cycles of Rituxan on April 18, 2015. Bone marrow biopsy results after completion of 8 weeks of Rituxan without evidence of residual disease. No intervention is needed at this time. If there were suspicion of recurrence, patient will require a bone marrow biopsy to confirm.  CBC continues to be within normal limits.  Return to clinic in 6 months for laboratory work only and then in 1 year for laboratory work and further evaluation.   2. Thrombocytopenia: Resolved.  Patient had normal trilineage hematopoiesis in his bone marrow.  3. Weakness and fatigue: Patient does not complain of this  today. 4. GERD: Continue over-the-counter Zantac and Tums as needed.  I spent a total of 20 minutes face-to-face with the patient of which greater than 50% of the visit was spent in counseling and coordination of care as detailed above.   Patient expressed understanding and was in agreement with this plan. He also understands that He can call clinic at any time with any questions, concerns, or complaints.    [name], MD 08/14/18 11:03 AM   ROS

## 2018-08-11 ENCOUNTER — Inpatient Hospital Stay (HOSPITAL_BASED_OUTPATIENT_CLINIC_OR_DEPARTMENT_OTHER): Payer: PPO | Admitting: Oncology

## 2018-08-11 ENCOUNTER — Other Ambulatory Visit: Payer: Self-pay

## 2018-08-11 ENCOUNTER — Inpatient Hospital Stay: Payer: PPO | Attending: Oncology

## 2018-08-11 VITALS — BP 131/65 | HR 62 | Temp 97.8°F | Ht 64.5 in | Wt 167.0 lb

## 2018-08-11 DIAGNOSIS — N183 Chronic kidney disease, stage 3 (moderate): Secondary | ICD-10-CM

## 2018-08-11 DIAGNOSIS — Z79899 Other long term (current) drug therapy: Secondary | ICD-10-CM

## 2018-08-11 DIAGNOSIS — Z9225 Personal history of immunosupression therapy: Secondary | ICD-10-CM

## 2018-08-11 DIAGNOSIS — Z951 Presence of aortocoronary bypass graft: Secondary | ICD-10-CM | POA: Insufficient documentation

## 2018-08-11 DIAGNOSIS — I129 Hypertensive chronic kidney disease with stage 1 through stage 4 chronic kidney disease, or unspecified chronic kidney disease: Secondary | ICD-10-CM

## 2018-08-11 DIAGNOSIS — K219 Gastro-esophageal reflux disease without esophagitis: Secondary | ICD-10-CM | POA: Diagnosis not present

## 2018-08-11 DIAGNOSIS — Z7982 Long term (current) use of aspirin: Secondary | ICD-10-CM

## 2018-08-11 DIAGNOSIS — C9141 Hairy cell leukemia, in remission: Secondary | ICD-10-CM | POA: Diagnosis not present

## 2018-08-11 DIAGNOSIS — M1990 Unspecified osteoarthritis, unspecified site: Secondary | ICD-10-CM | POA: Insufficient documentation

## 2018-08-11 DIAGNOSIS — E785 Hyperlipidemia, unspecified: Secondary | ICD-10-CM | POA: Diagnosis not present

## 2018-08-11 LAB — CBC WITH DIFFERENTIAL/PLATELET
Abs Immature Granulocytes: 0.03 10*3/uL (ref 0.00–0.07)
BASOS PCT: 1 %
Basophils Absolute: 0 10*3/uL (ref 0.0–0.1)
EOS ABS: 0.3 10*3/uL (ref 0.0–0.5)
EOS PCT: 5 %
HEMATOCRIT: 39.8 % (ref 39.0–52.0)
Hemoglobin: 13 g/dL (ref 13.0–17.0)
Immature Granulocytes: 1 %
LYMPHS ABS: 0.8 10*3/uL (ref 0.7–4.0)
Lymphocytes Relative: 13 %
MCH: 30.4 pg (ref 26.0–34.0)
MCHC: 32.7 g/dL (ref 30.0–36.0)
MCV: 93 fL (ref 80.0–100.0)
Monocytes Absolute: 0.6 10*3/uL (ref 0.1–1.0)
Monocytes Relative: 10 %
NRBC: 0 % (ref 0.0–0.2)
Neutro Abs: 4.4 10*3/uL (ref 1.7–7.7)
Neutrophils Relative %: 70 %
PLATELETS: 182 10*3/uL (ref 150–400)
RBC: 4.28 MIL/uL (ref 4.22–5.81)
RDW: 12.8 % (ref 11.5–15.5)
WBC: 6.3 10*3/uL (ref 4.0–10.5)

## 2018-08-11 NOTE — Progress Notes (Signed)
Patient is here today to follow up on his hairy cell leukemia, in remission. Patient stated that he had been doing well with no complaints.

## 2018-08-14 DIAGNOSIS — H903 Sensorineural hearing loss, bilateral: Secondary | ICD-10-CM | POA: Diagnosis not present

## 2018-08-17 NOTE — Progress Notes (Signed)
Cardiology Office Note  Date:  08/18/2018   ID:  Frank Mcdaniel, DOB 1932/11/14, MRN [id number]  PCP:  Frank Haven, MD   Chief Complaint  Patient presents with  . Other    12 month follow up. Patient denies chest pain and SOB. meds reviewed verbally with patient.      HPI:  85 year old gentleman with a history of  coronary artery disease, bypass in July 2000, hairy cell leukemia,   chronic anemia,  history of hyperlipidemia.  who presents for routine followup of his coronary artery disease.   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  Denies any shortness of breath admission to the hospital June 2016 for shortness of breath, had a echocardiogram, stress test Showing no ischemia Diagnosed with asthma exacerbation  History of anemia,  periodic follow-up with Dr. Grayland Mcdaniel  EKG personally reviewed by myself on todays visit Shows sinus bradycardia rate 53 bpm right bundle branch block no significant ST or T wave changes No significant change from previous EKGs  Other past medical history  slipping on the ice in January 2016, possibly landed on his left arm, had chest pressure, described as a dullness in his left pectoral area. He went to the emergency room and evaluation showed negative cardiac enzymes, otherwise normal lab work including CBC, basic metabolic panel, EKG unchanged, chest x-ray normal. He was discharged home with follow-up today Possibly had one or 2 very short episodes of fleeting low-grade almost in the left chest. Again he  wonders if it could be musculoskeletal from recent fall  Last stress test 01/2015 was a lexiscan, ejection fraction 65%, no ischemia noted, no EKG changes   PMH:   has a past medical history of Anemia (09/01/2015), Anxiety (09/11/2015), Arthritis, Asthma, Asymptomatic Sinus Bradycardia, Atypical chest pain, BPH (benign prostatic hyperplasia), CKD (chronic kidney disease), stage III ([place]), Coronary artery disease, GERD (gastroesophageal reflux disease), H/O echocardiogram, Hairy cell leukemia ([place]), HOH (hard of hearing), HTN (hypertension), Hyperlipidemia, Hypertension, and Leukemia ([place]).  PSH:    Past Surgical History:  Procedure Laterality Date  . CARDIAC CATHETERIZATION N/A 05/26/2015   Procedure: Left Heart Cath;  Surgeon: [name], MD;  Location: [place] [facility] LAB;  Service: Cardiovascular;  Laterality: N/A;  . CARDIAC CATHETERIZATION N/A 05/26/2015   Procedure: Coronary/Graft Angiography;  Surgeon: [name], MD;  Location: [place] [facility];  Service: Cardiovascular;  Laterality: N/A;  . CATARACT EXTRACTION    . CORONARY ARTERY BYPASS GRAFT  2000  . ESOPHAGOGASTRODUODENOSCOPY N/A 02/18/2017   Procedure: ESOPHAGOGASTRODUODENOSCOPY (EGD) with removal of food bolus;  Surgeon: Frank Lame, MD;  Location: [facility] ENDOSCOPY;  Service: Endoscopy;  Laterality: N/A;  . ESOPHAGOGASTRODUODENOSCOPY (EGD) WITH PROPOFOL N/A 03/11/2017   Procedure: ESOPHAGOGASTRODUODENOSCOPY (EGD) WITH PROPOFOL;  Surgeon: Frank Lame, MD;  Location: [facility] ENDOSCOPY;  Service: Endoscopy;  Laterality: N/A;  . ESOPHAGOGASTRODUODENOSCOPY (EGD) WITH PROPOFOL N/A 04/15/2017   Procedure: ESOPHAGOGASTRODUODENOSCOPY (EGD) WITH PROPOFOL;  Surgeon: Frank Lame, MD;  Location: [facility] ENDOSCOPY;  Service: Endoscopy;  Laterality: N/A;  . EYE  SURGERY Left    Cataract Extraction with IOL  . INGUINAL HERNIA REPAIR Left 06/24/2016   Procedure: HERNIA REPAIR INGUINAL ADULT;  Surgeon: Frank Bellow, MD;  Location: [facility]  ORS;  Service: General;  Laterality: Left;  . TONSILLECTOMY  1942   . UPPER GASTROINTESTINAL ENDOSCOPY  3 years ago    with Dilation.     Current Outpatient Medications  Medication Sig Dispense Refill  . aspirin 81 MG EC tablet Take 2 tablets (162 mg total) by mouth daily. 30 tablet 0  . atorvastatin (LIPITOR) 40 MG tablet TAKE 1/2 TABLETS (20 MG TOTAL) BY MOUTH DAILY. 45 tablet 3  . Calcium Carbonate Antacid (TUMS CHEWY BITES PO) Take 1 tablet by mouth as needed.     . isosorbide mononitrate (IMDUR) 30 MG 24 hr tablet Take 1 tablet (30 mg total) by mouth daily. 90 tablet 3  . Multiple Vitamin (MULTIVITAMIN) tablet Take 1 tablet by mouth daily.      . polyethylene glycol (MIRALAX / GLYCOLAX) packet Take 17 g by mouth daily as needed.     . tamsulosin (FLOMAX) 0.4 MG CAPS capsule TAKE 1 CAPSULE (0.4 MG TOTAL) BY MOUTH DAILY. 30 capsule 11   No current facility-administered medications for this visit.      Allergies:   Patient has no known allergies.   Social History:  The patient  reports that he has never smoked. He has never used smokeless tobacco. He reports that he does not drink alcohol or use drugs.   Family History:   family history includes Arthritis in his father; Hypertension in his mother; Other in an other family member.    Review of Systems: Review of Systems  Constitutional: Negative.   Respiratory: Negative.   Cardiovascular: Negative.   Gastrointestinal: Negative.        Constipation  Musculoskeletal: Negative.   Neurological: Negative.   Psychiatric/Behavioral: Negative.   All other systems reviewed and are negative.    PHYSICAL EXAM: VS:  BP 140/60 (BP Location: Left Arm, Patient Position: Sitting, Cuff Size: Normal)   Pulse (!) 52   Ht 5\' 9"  (1.753 m)   Wt 167 lb (75.8 kg)   BMI 24.66 kg/m  , BMI Body mass index is 24.66 kg/m. Constitutional:  oriented to person, place, and time. No distress.  HENT:  Head: Grossly normal Eyes:  no discharge. No  scleral icterus.  Neck: No JVD, no carotid bruits  Cardiovascular: Regular rate and rhythm, no murmurs appreciated Pulmonary/Chest: Clear to auscultation bilaterally, no wheezes or rails Abdominal: Soft.  no distension.  no tenderness.  Musculoskeletal: Normal range of motion Neurological:  normal muscle tone. Coordination normal. No atrophy Skin: Skin warm and dry Psychiatric: normal affect, pleasant   Recent Labs: 11/06/2017: ALT 13; BUN 20; Creatinine, Ser 1.38; Potassium 4.3; Sodium 139 08/11/2018: Hemoglobin 13.0; Platelets 182    Lipid Panel Lab Results  Component Value Date   CHOL 123 11/06/2017   HDL 54.80 11/06/2017   LDLCALC 54 11/06/2017   TRIG 67.0 11/06/2017     Wt Readings from Last 3 Encounters:  08/18/18 167 lb (75.8 kg)  08/11/18 167 lb (75.8 kg)  07/16/18 169 lb 3.2 oz (76.7 kg)       ASSESSMENT AND PLAN:  Pure hypercholesterolemia Cholesterol at goal, will continue current medication regiment  Essential hypertension Blood pressure high end of normal He will continue to monitor blood pressure at home No changes made  Coronary artery disease involving native coronary artery of native  heart without angina pectoris - Plan: EKG 12-Lead We previously held Ranexa for constipation and cost No change in his chest pain symptoms by holding the medication We will continue Imdur No further ischemic work-up at this time  Angina pectoris [facility]) 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 Grayland Mcdaniel, stable  S/P CABG (coronary artery bypass graft) Stressed importance of need for continued aggressive lipid management Exercise program   Total encounter time more than 25 minutes  Greater than 50% was spent in counseling and coordination of care with the patient   Disposition:   F/U  12 months   Orders  Placed This Encounter  Procedures  . EKG 12-Lead     Signed, Esmond Plants, M.D., Ph.D. 08/18/2018  [place], [place]

## 2018-08-18 ENCOUNTER — Ambulatory Visit (INDEPENDENT_AMBULATORY_CARE_PROVIDER_SITE_OTHER): Payer: PPO | Admitting: Cardiovascular Disease

## 2018-08-18 ENCOUNTER — Encounter: Payer: Self-pay | Admitting: Cardiovascular Disease

## 2018-08-18 VITALS — BP 140/60 | HR 52 | Ht 69.0 in | Wt 167.0 lb

## 2018-08-18 DIAGNOSIS — I10 Essential (primary) hypertension: Secondary | ICD-10-CM | POA: Diagnosis not present

## 2018-08-18 DIAGNOSIS — Z951 Presence of aortocoronary bypass graft: Secondary | ICD-10-CM

## 2018-08-18 DIAGNOSIS — E782 Mixed hyperlipidemia: Secondary | ICD-10-CM

## 2018-08-18 DIAGNOSIS — I25118 Atherosclerotic heart disease of native coronary artery with other forms of angina pectoris: Secondary | ICD-10-CM

## 2018-08-18 DIAGNOSIS — C9141 Hairy cell leukemia, in remission: Secondary | ICD-10-CM | POA: Diagnosis not present

## 2018-08-18 DIAGNOSIS — R600 Localized edema: Secondary | ICD-10-CM | POA: Diagnosis not present

## 2018-08-18 NOTE — Patient Instructions (Signed)

## 2018-08-29 DIAGNOSIS — S61002A Unspecified open wound of left thumb without damage to nail, initial encounter: Secondary | ICD-10-CM | POA: Diagnosis not present

## 2018-08-29 DIAGNOSIS — S62502A Fracture of unspecified phalanx of left thumb, initial encounter for closed fracture: Secondary | ICD-10-CM | POA: Diagnosis not present

## 2018-08-31 ENCOUNTER — Telehealth: Payer: Self-pay | Admitting: Family Medicine

## 2018-08-31 ENCOUNTER — Ambulatory Visit (INDEPENDENT_AMBULATORY_CARE_PROVIDER_SITE_OTHER): Payer: PPO | Admitting: Family Medicine

## 2018-08-31 ENCOUNTER — Encounter: Payer: Self-pay | Admitting: Family Medicine

## 2018-08-31 VITALS — BP 120/62 | HR 98 | Temp 98.2°F | Ht 69.0 in | Wt 164.0 lb

## 2018-08-31 DIAGNOSIS — S61011A Laceration without foreign body of right thumb without damage to nail, initial encounter: Secondary | ICD-10-CM | POA: Diagnosis not present

## 2018-08-31 DIAGNOSIS — S62639A Displaced fracture of distal phalanx of unspecified finger, initial encounter for closed fracture: Secondary | ICD-10-CM

## 2018-08-31 DIAGNOSIS — S62524B Nondisplaced fracture of distal phalanx of right thumb, initial encounter for open fracture: Secondary | ICD-10-CM | POA: Diagnosis not present

## 2018-08-31 HISTORY — DX: Displaced fracture of distal phalanx of unspecified finger, initial encounter for closed fracture: S62.639A

## 2018-08-31 NOTE — Progress Notes (Signed)
  [name], MD Phone: [phone]  WOODY KRONBERG is a 85 y.o. male who presents today for same day visit.   CC: right thumb injury  Patient notes yesterday he was sitting on a trailer and placed his right hand on a piece of equipment that was imbalanced.  He subsequently ended up with injury to his right thumb with a laceration and a tuft fracture.  [facility] at [facility] and received sutures and an x-ray which revealed an acute nondisplaced first tuft fracture.  Closed fracture.  This is based on the impression from the radiologist over read.  He was placed on Bactrim for infection prevention.  He notes no numbness or tingling in the finger.  No significant pain.  No bleeding.  No fevers.  He has been taking the Bactrim. There is no apparent nail bed disruption.  Social History   Tobacco Use  Smoking Status Never Smoker  Smokeless Tobacco Never Used     ROS see history of present illness  Objective  Physical Exam Vitals:   08/31/18 0856  BP: 120/62  Pulse: 98  Temp: 98.2 F (36.8 C)  SpO2: 98%    BP Readings from Last 3 Encounters:  08/31/18 120/62  08/18/18 140/60  08/11/18 131/65   Wt Readings from Last 3 Encounters:  08/31/18 164 lb (74.4 kg)  08/18/18 167 lb (75.8 kg)  08/11/18 167 lb (75.8 kg)    Physical Exam Constitutional:      General: He is not in acute distress.    Appearance: He is not diaphoretic.  Cardiovascular:     Rate and Rhythm: Normal rate and regular rhythm.     Heart sounds: Normal heart sounds.  Pulmonary:     Effort: Pulmonary effort is normal.     Breath sounds: Normal breath sounds.  Musculoskeletal:     Comments: See picture for right thumb wound  Skin:    General: Skin is warm and dry.  Neurological:     Mental Status: He is alert.        5/5 strength right thumb flexion and extension, sensation to light touch intact, no significant tenderness, thumb with good capillary  refill  Assessment/Plan: Please see individual problem list.  Laceration of right thumb without foreign body without damage to nail Patient without apparent damage to the nailbed though does have a tuft fracture.  No apparent signs of infection.  The thumb was cleansed with sterile saline.  Neurovascularly intact.  He will continue Bactrim.  The patient needs to be evaluated by orthopedics given fracture.  Discussed need for orthopedic evaluation with our referral coordinator.  She advised the easiest way for him to be seen quickly would be going to the [facility] urgent clinic.  I advised the patient's son of this and they will go today between 1 and 7 when they are open. Given return precautions.     Orders Placed This Encounter  Procedures  . Ambulatory referral to Orthopedic Surgery    Referral Priority:   Routine    Referral Type:   Surgical    Referral Reason:   Specialty Services Required    Requested Specialty:   Orthopedic Surgery    Number of Visits Requested:   1    No orders of the defined types were placed in this encounter.    [name], MD [place]

## 2018-08-31 NOTE — Patient Instructions (Signed)
Nice to see you. I would like for you to see orthopedics.  We will have you go to [facility] for evaluation. Please go to their urgent care orthopedic clinic between 1-7 pm. They should be able to provide you with a splint if needed and determine appropriate follow-up. If you develop numbness, tingling, increased pain, drainage of pus, increased swelling, or any new or changing symptoms please seek medical attention immediately.

## 2018-08-31 NOTE — Assessment & Plan Note (Addendum)
Patient without apparent damage to the nailbed though does have a tuft fracture.  No apparent signs of infection.  The thumb was cleansed with sterile saline.  Neurovascularly intact.  He will continue Bactrim.  The patient needs to be evaluated by orthopedics given fracture.  Discussed need for orthopedic evaluation with our referral coordinator.  She advised the easiest way for him to be seen quickly would be going to the [facility] urgent clinic.  I advised the patient's son of this and they will go today between 1 and 7 when they are open. Given return precautions.

## 2018-09-03 ENCOUNTER — Telehealth: Payer: Self-pay | Admitting: Family Medicine

## 2018-09-03 NOTE — Telephone Encounter (Signed)
Please contact the patients son to find out if he was seen at [facility] for evaluation of his thumb and find out if they are bringing him back for suture removal. If they are not he will need to be seen in our office for suture removal. Thanks.

## 2018-09-03 NOTE — Telephone Encounter (Addendum)
Patient has an appointment with you for suture removal on 09/07/18, patient did go to [facility].

## 2018-09-07 ENCOUNTER — Ambulatory Visit (INDEPENDENT_AMBULATORY_CARE_PROVIDER_SITE_OTHER): Payer: PPO | Admitting: Family Medicine

## 2018-09-07 ENCOUNTER — Encounter: Payer: Self-pay | Admitting: Family Medicine

## 2018-09-07 VITALS — BP 120/78 | HR 56 | Temp 98.0°F | Ht 69.0 in | Wt 164.2 lb

## 2018-09-07 DIAGNOSIS — Z5181 Encounter for therapeutic drug level monitoring: Secondary | ICD-10-CM

## 2018-09-07 DIAGNOSIS — S61011D Laceration without foreign body of right thumb without damage to nail, subsequent encounter: Secondary | ICD-10-CM

## 2018-09-07 DIAGNOSIS — S62639A Displaced fracture of distal phalanx of unspecified finger, initial encounter for closed fracture: Secondary | ICD-10-CM | POA: Diagnosis not present

## 2018-09-07 LAB — BASIC METABOLIC PANEL
BUN: 22 mg/dL (ref 6–23)
CALCIUM: 9.6 mg/dL (ref 8.4–10.5)
CHLORIDE: 104 meq/L (ref 96–112)
CO2: 22 mEq/L (ref 19–32)
CREATININE: 1.85 mg/dL — AB (ref 0.40–1.50)
GFR: 35 mL/min — ABNORMAL LOW (ref >60.00)
GLUCOSE: 92 mg/dL (ref 70–99)
Potassium: 4.1 mEq/L (ref 3.5–5.1)
Sodium: 134 mEq/L — ABNORMAL LOW (ref 135–145)

## 2018-09-07 MED ORDER — SULFAMETHOXAZOLE-TRIMETHOPRIM 800-160 MG PO TABS: 1.0000 | ORAL_TABLET | Freq: Two times a day (BID) | ORAL | 0 refills | Status: DC

## 2018-09-07 NOTE — Patient Instructions (Addendum)
Nice to see you. We will extend your bactrim for 4 additional days.  We will check labs today.  If you develop pain, swelling, drainage, redness, bleeding, or any new or changing symptoms please seek medical attention immediately.

## 2018-09-07 NOTE — Assessment & Plan Note (Signed)
Laceration appears to be well-healing.  Laceration was observed by myself, 1 of our FNP's, and our LPN and there were no residual sutures noted.  3 sutures were removed.  There was a small amount of purulent and bloody drainage from the most distal suture.  Given this we will extend his Bactrim for a total course of 14 days.  We will check a BMP given duration of Bactrim and his age.  We will recheck him in 2 days.  He is given return precautions.  We will request new documentation from the urgent care.

## 2018-09-07 NOTE — Assessment & Plan Note (Signed)
Patient was seen by orthopedics.  No further evaluation or treatment was recommended.

## 2018-09-07 NOTE — Progress Notes (Signed)
Frank Rumps, MD Phone: [phone]  Frank Mcdaniel is a 85 y.o. male who presents today for follow-up.  CC: Suture removal  Patient presents for follow-up on his thumb wound.  He did see orthopedics given fracture in his thumb.  He was advised no splint and to have his sutures removed in 7 days which is today.  No follow-up was recommended.  He has noted no drainage, fever, or pain.  He does note there was some bleeding from the portion closest to his nail where there is not a suture.  This occurred earlier today.  He used a paper towel to get this to stop.  He was able to get this stopped fairly easily.  Prior documentation that was given to [place] by the patient's son from the urgent care did not document how many sutures he had.  CMA contacted the urgent care and they reported "4 nylon sutures and 2 poly-sutures."  Social History   Tobacco Use  Smoking Status Never Smoker  Smokeless Tobacco Never Used     ROS see history of present illness  Objective  Physical Exam Vitals:   09/07/18 1128  BP: 120/78  Pulse: (!) 56  Temp: 98 F (36.7 C)  SpO2: 97%    BP Readings from Last 3 Encounters:  09/07/18 120/78  08/31/18 120/62  08/18/18 140/60   Wt Readings from Last 3 Encounters:  09/07/18 164 lb 3.2 oz (74.5 kg)  08/31/18 164 lb (74.4 kg)  08/18/18 167 lb (75.8 kg)    Physical Exam Constitutional:      Appearance: Normal appearance.  Musculoskeletal:     Comments: Left thumb with bandage in place with paper towel that was stuck initially to the laceration, sterile water, alcohol swabs, and sterile gauze used to remove the paper towel, sterile tweezers used to remove paper towel as well, 3 sutures were identified, there is scab and some dried blood, no surrounding erythema, no tenderness, the area was cleansed with alcohol swabs prior to removing the sutures, the most distal suture from the nailbed was already untied prior to removal, this suture was removed and  revealed a very small amount of purulent and bloody drainage, there was no fluctuance in this area, 3 sutures were removed, there was not a fourth suture observed  Neurological:     Mental Status: He is alert.      Assessment/Plan: Please see individual problem list.  Laceration of right thumb without foreign body without damage to nail Laceration appears to be well-healing.  Laceration was observed by myself, 1 of our FNP's, and our LPN and there were no residual sutures noted.  3 sutures were removed.  There was a small amount of purulent and bloody drainage from the most distal suture.  Given this we will extend his Bactrim for a total course of 14 days.  We will check a BMP given duration of Bactrim and his age.  We will recheck him in 2 days.  He is given return precautions.  We will request new documentation from the urgent care.  Closed fracture of tuft of distal phalanx of finger Patient was seen by orthopedics.  No further evaluation or treatment was recommended.    Orders Placed This Encounter  Procedures  . Basic Metabolic Panel (BMET)    Meds ordered this encounter  Medications  . sulfamethoxazole-trimethoprim (BACTRIM DS,SEPTRA DS) 800-160 MG tablet    Sig: Take 1 tablet by mouth 2 (two) times daily.    Dispense:  8  tablet    Refill:  0     Frank Rumps, MD [place]

## 2018-09-08 ENCOUNTER — Other Ambulatory Visit: Payer: Self-pay | Admitting: Cardiovascular Disease

## 2018-09-09 ENCOUNTER — Ambulatory Visit (INDEPENDENT_AMBULATORY_CARE_PROVIDER_SITE_OTHER): Payer: PPO | Admitting: Family Medicine

## 2018-09-09 ENCOUNTER — Encounter: Payer: Self-pay | Admitting: Family Medicine

## 2018-09-09 VITALS — BP 102/66 | HR 64 | Temp 98.5°F | Ht 69.0 in | Wt 165.4 lb

## 2018-09-09 DIAGNOSIS — S61011D Laceration without foreign body of right thumb without damage to nail, subsequent encounter: Secondary | ICD-10-CM

## 2018-09-09 DIAGNOSIS — N179 Acute kidney failure, unspecified: Secondary | ICD-10-CM

## 2018-09-09 NOTE — Assessment & Plan Note (Signed)
Recheck renal function. ?

## 2018-09-09 NOTE — Patient Instructions (Signed)
Nice to see you. Please finish the course of antibiotics. Please monitor your thumb and if you develop fever, drainage of pus, redness, pain, swelling, or any new or changing symptoms please seek medical attention. You can gently wash with soap and water at this time.

## 2018-09-09 NOTE — Progress Notes (Signed)
  [name], MD Phone: [phone]  Frank Mcdaniel is a 85 y.o. male who presents today for follow-up.  CC: Thumb injury, AKI  Right thumb injury: This has been healing well.  He does have some mild bleeding from this.  No fever.  No purulent drainage.  He notes the swelling has improved.  No pain.  No erythema.  He continues on Bactrim given prior small amount of purulent drainage at the site of 1 of his sutures.  AKI: Noticed on recent lab work.  He does drink a lot of caffeinated tea and soft drinks.  No NSAIDs.  No supplements.  Due for recheck.  No diarrhea.  He does urinate frequently related to BPH though he has no issues emptying his bladder.  Social History   Tobacco Use  Smoking Status Never Smoker  Smokeless Tobacco Never Used     ROS see history of present illness  Objective  Physical Exam Vitals:   09/09/18 1504  BP: 102/66  Pulse: 64  Temp: 98.5 F (36.9 C)  SpO2: 96%    BP Readings from Last 3 Encounters:  09/09/18 102/66  09/07/18 120/78  08/31/18 120/62   Wt Readings from Last 3 Encounters:  09/09/18 165 lb 6.4 oz (75 kg)  09/07/18 164 lb 3.2 oz (74.5 kg)  08/31/18 164 lb (74.4 kg)    Physical Exam Constitutional:      General: He is not in acute distress.    Appearance: He is not diaphoretic.  Cardiovascular:     Rate and Rhythm: Normal rate and regular rhythm.     Heart sounds: Normal heart sounds.  Pulmonary:     Effort: Pulmonary effort is normal.     Breath sounds: Normal breath sounds.  Abdominal:     General: There is no distension.     Palpations: Abdomen is soft.     Tenderness: There is no abdominal tenderness.  Musculoskeletal:     Comments: Right thumb is well-healing with no erythema, no purulent drainage, no fluctuance, no induration, no tenderness, no significant swelling, sensation intact  Skin:    General: Skin is warm and dry.  Neurological:     Mental Status: He is alert.      Assessment/Plan: Please see  individual problem list.  AKI (acute kidney injury) ([place]) Recheck renal function.  Laceration of right thumb without foreign body without damage to nail Appears to be well-healing.  He will finish his course of antibiotics.  They will continue to dress this.  He can cleanse with soap and water gently.  Given return precautions.    Orders Placed This Encounter  Procedures  . Basic Metabolic Panel (BMET)    No orders of the defined types were placed in this encounter.    [name], MD [place]

## 2018-09-09 NOTE — Assessment & Plan Note (Signed)
Appears to be well-healing.  He will finish his course of antibiotics.  They will continue to dress this.  He can cleanse with soap and water gently.  Given return precautions.

## 2018-09-10 ENCOUNTER — Other Ambulatory Visit: Payer: Self-pay | Admitting: Family Medicine

## 2018-09-10 ENCOUNTER — Telehealth: Payer: Self-pay | Admitting: Family Medicine

## 2018-09-10 DIAGNOSIS — N179 Acute kidney failure, unspecified: Secondary | ICD-10-CM

## 2018-09-10 LAB — BASIC METABOLIC PANEL
BUN: 20 mg/dL (ref 6–23)
CALCIUM: 9.5 mg/dL (ref 8.4–10.5)
CHLORIDE: 105 meq/L (ref 96–112)
CO2: 28 meq/L (ref 19–32)
CREATININE: 2.02 mg/dL — AB (ref 0.40–1.50)
GFR: 31.62 mL/min — ABNORMAL LOW (ref >60.00)
Glucose, Bld: 89 mg/dL (ref 70–99)
Potassium: 4.5 mEq/L (ref 3.5–5.1)
Sodium: 138 mEq/L (ref 135–145)

## 2018-09-10 NOTE — Telephone Encounter (Signed)
Copied from [place] [id number]. Topic: Quick Communication - Lab Results (Clinic Use ONLY) >> Sep 10, 2018  2:16 PM Neta Ehlers, [place] wrote: Called patient to inform them of 09/09/2018 lab results. When patient returns call, triage nurse may disclose results. >> Sep 10, 2018  2:27 PM Bea Graff, NT wrote: Pt calling back to receive lab results.

## 2018-09-10 NOTE — Telephone Encounter (Signed)
Called pt. He had labs drawn yesterday - no results as of yet. Instructed him we will call when they result.

## 2018-09-18 ENCOUNTER — Other Ambulatory Visit (INDEPENDENT_AMBULATORY_CARE_PROVIDER_SITE_OTHER): Payer: PPO

## 2018-09-18 DIAGNOSIS — N179 Acute kidney failure, unspecified: Secondary | ICD-10-CM

## 2018-09-18 LAB — BASIC METABOLIC PANEL
BUN: 19 mg/dL (ref 6–23)
CO2: 26 meq/L (ref 19–32)
Calcium: 9.2 mg/dL (ref 8.4–10.5)
Chloride: 105 mEq/L (ref 96–112)
Creatinine, Ser: 1.42 mg/dL (ref 0.40–1.50)
GFR: 47.49 mL/min — AB (ref >60.00)
GLUCOSE: 91 mg/dL (ref 70–99)
POTASSIUM: 4.2 meq/L (ref 3.5–5.1)
SODIUM: 139 meq/L (ref 135–145)

## 2018-11-03 ENCOUNTER — Other Ambulatory Visit: Payer: Self-pay | Admitting: Internal Medicine

## 2018-11-03 ENCOUNTER — Telehealth: Payer: Self-pay

## 2018-11-03 NOTE — Telephone Encounter (Signed)
Patient rescheduled appointment to 5/5 for follow up. He states that he cannot do a virtual visit.

## 2018-11-03 NOTE — Telephone Encounter (Signed)
Copied from [place] [id number]. Topic: General - Other >> Nov 03, 2018  1:00 PM Virl Axe D wrote: Reason for CRM: Pt returned Rasheeda call regarding changing his OV on 11/06/18 to a virtual visit. No answer on FC line. Please reach back out to pt. [phone]

## 2018-11-06 ENCOUNTER — Ambulatory Visit: Payer: Medicare Other | Admitting: Family Medicine

## 2018-12-08 ENCOUNTER — Other Ambulatory Visit: Payer: Self-pay

## 2018-12-08 ENCOUNTER — Ambulatory Visit (INDEPENDENT_AMBULATORY_CARE_PROVIDER_SITE_OTHER): Payer: PPO | Admitting: Family Medicine

## 2018-12-08 ENCOUNTER — Telehealth: Payer: Self-pay | Admitting: Family Medicine

## 2018-12-08 ENCOUNTER — Encounter: Payer: Self-pay | Admitting: Family Medicine

## 2018-12-08 DIAGNOSIS — S61011D Laceration without foreign body of right thumb without damage to nail, subsequent encounter: Secondary | ICD-10-CM

## 2018-12-08 DIAGNOSIS — I2510 Atherosclerotic heart disease of native coronary artery without angina pectoris: Secondary | ICD-10-CM | POA: Diagnosis not present

## 2018-12-08 DIAGNOSIS — C9141 Hairy cell leukemia, in remission: Secondary | ICD-10-CM | POA: Diagnosis not present

## 2018-12-08 DIAGNOSIS — E782 Mixed hyperlipidemia: Secondary | ICD-10-CM

## 2018-12-08 DIAGNOSIS — N179 Acute kidney failure, unspecified: Secondary | ICD-10-CM | POA: Diagnosis not present

## 2018-12-08 NOTE — Assessment & Plan Note (Signed)
Patient feels well.  He will continue to see oncology.

## 2018-12-08 NOTE — Assessment & Plan Note (Signed)
This has healed well.

## 2018-12-08 NOTE — Assessment & Plan Note (Signed)
Asymptomatic.  Continue current management through cardiology.

## 2018-12-08 NOTE — Progress Notes (Signed)
Virtual Visit via telephone Note  This visit type was conducted due to national recommendations for restrictions regarding the COVID-19 pandemic (e.g. social distancing).  This format is felt to be most appropriate for this patient at this time.  All issues noted in this document were discussed and addressed.  No physical exam was performed (except for noted visual exam findings with Video Visits).   I connected with Frank Mcdaniel today at  3:30 PM EDT by telephone and verified that I am speaking with the correct person using two identifiers. Location patient: home Location provider: work Persons participating in the virtual visit: patient, provider, Ashante Snelling (son)  I discussed the limitations, risks, security and privacy concerns of performing an evaluation and management service by telephone and the availability of in person appointments. I also discussed with the patient that there may be a patient responsible charge related to this service. The patient expressed understanding and agreed to proceed.  Interactive audio and video telecommunications were attempted between this provider and patient, however failed, due to patient having technical difficulties OR patient did not have access to video capability.  We continued and completed visit with audio only.   Reason for visit: follow-up  HPI: Hyperlipidemia: Taking Lipitor.  No chest pain, shortness of breath, or edema.  Taking Advair as well.  Hairy cell leukemia: Notes he is doing quite well.  He has had no fatigue, weight loss, or night sweats.  Thumb laceration: He notes this is healed up quite well.  He was having some tenderness though that has improved at this time.  AKI: This improved on recheck.  Likely related to the Bactrim that he was on.  We will plan on rechecking in 1 month.   ROS: See pertinent positives and negatives per HPI.  Past Medical History:  Diagnosis Date  . Anemia 09/01/2015  . Anxiety 09/11/2015   . Arthritis   . Asthma    as a teenager  . Asymptomatic Sinus Bradycardia   . Atypical chest pain    a. 08/2014  . BPH (benign prostatic hyperplasia)   . CKD (chronic kidney disease), stage III ([place])   . Coronary artery disease    a. 2000 s/p CABG;  b. 10/2008 Neg MV, EF 64%; c. cath 05/2015: LM 85% sev cal, pLAD-1 lesion 80%, pLAD-2 lesion 100% chronic, ostLCx 70% sev cal, OM4 90%, ostRCA 70%, mid RCA 90%, LIMA-LAD patent, VG-OM2 patent, VG-OM3 patent, VG-RPDA patent. No AS  . GERD (gastroesophageal reflux disease)   . H/O echocardiogram    a. 10/2005 Echo: nl EF.  [name] Hairy cell leukemia ([place])   . HOH (hard of hearing)    Bilateral hearing aids  . HTN (hypertension)   . Hyperlipidemia   . Hypertension   . Leukemia [facility])     Past Surgical History:  Procedure Laterality Date  . CARDIAC CATHETERIZATION N/A 05/26/2015   Procedure: Left Heart Cath;  Surgeon: [name], MD;  Location: [place] CV LAB;  Service: Cardiovascular;  Laterality: N/A;  . CARDIAC CATHETERIZATION N/A 05/26/2015   Procedure: Coronary/Graft Angiography;  Surgeon: [name], MD;  Location: [place] CV LAB;  Service: Cardiovascular;  Laterality: N/A;  . CATARACT EXTRACTION    . CORONARY ARTERY BYPASS GRAFT  2000  . ESOPHAGOGASTRODUODENOSCOPY N/A 02/18/2017   Procedure: ESOPHAGOGASTRODUODENOSCOPY (EGD) with removal of food bolus;  Surgeon: [name], MD;  Location: [facility] ENDOSCOPY;  Service: Endoscopy;  Laterality: N/A;  . ESOPHAGOGASTRODUODENOSCOPY (EGD) WITH PROPOFOL N/A 03/11/2017  Procedure: ESOPHAGOGASTRODUODENOSCOPY (EGD) WITH PROPOFOL;  Surgeon: [name], MD;  Location: [facility] ENDOSCOPY;  Service: Endoscopy;  Laterality: N/A;  . ESOPHAGOGASTRODUODENOSCOPY (EGD) WITH PROPOFOL N/A 04/15/2017   Procedure: ESOPHAGOGASTRODUODENOSCOPY (EGD) WITH PROPOFOL;  Surgeon: [name], MD;  Location: [facility] ENDOSCOPY;  Service: Endoscopy;  Laterality: N/A;  . EYE SURGERY Left    Cataract Extraction  with IOL  . INGUINAL HERNIA REPAIR Left 06/24/2016   Procedure: HERNIA REPAIR INGUINAL ADULT;  Surgeon: [name], MD;  Location: [facility] ORS;  Service: General;  Laterality: Left;  . TONSILLECTOMY  1942   . UPPER GASTROINTESTINAL ENDOSCOPY  3 years ago    with Dilation.     Family History  Problem Relation Age of Onset  . Hypertension Mother   . Arthritis Father   . Other Other        no premature CAD.  [name] Colon cancer Neg Hx   . Prostate cancer Neg Hx   . Stomach cancer Neg Hx     SOCIAL HX: Non-smoker.   Current Outpatient Medications:  .  aspirin 81 MG EC tablet, Take 2 tablets (162 mg total) by mouth daily., Disp: 30 tablet, Rfl: 0 .  atorvastatin (LIPITOR) 40 MG tablet, TAKE 1/2 TABLETS (20 MG TOTAL) BY MOUTH DAILY., Disp: 45 tablet, Rfl: 3 .  Calcium Carbonate Antacid (TUMS CHEWY BITES PO), Take 1 tablet by mouth as needed. , Disp: , Rfl:  .  isosorbide mononitrate (IMDUR) 30 MG 24 hr tablet, TAKE 1 TABLET BY MOUTH EVERY DAY, Disp: 90 tablet, Rfl: 3 .  Multiple Vitamin (MULTIVITAMIN) tablet, Take 1 tablet by mouth daily.  , Disp: , Rfl:  .  polyethylene glycol (MIRALAX / GLYCOLAX) packet, Take 17 g by mouth daily as needed. , Disp: , Rfl:  .  sulfamethoxazole-trimethoprim (BACTRIM DS,SEPTRA DS) 800-160 MG tablet, Take 1 tablet by mouth 2 (two) times daily., Disp: 8 tablet, Rfl: 0 .  tamsulosin (FLOMAX) 0.4 MG CAPS capsule, TAKE 1 CAPSULE (0.4 MG TOTAL) BY MOUTH DAILY., Disp: 30 capsule, Rfl: 11  EXAM: This was a telehealth telephone visit and no physical exam was completed.  ASSESSMENT AND PLAN:  Discussed the following assessment and plan:  Coronary artery disease involving native coronary artery of native heart without angina pectoris  AKI (acute kidney injury) ([place])  Hairy cell leukemia, in remission ([place])  Laceration of right thumb without foreign body without damage to nail, subsequent encounter  Mixed hyperlipidemia - Plan: Lipid panel, Comp Met (CMET)   Coronary artery disease Asymptomatic.  Continue current management through cardiology.  AKI (acute kidney injury) ([place]) Plan to recheck in 1 month.  Hairy cell leukemia Patient feels well.  He will continue to see oncology.  Laceration of right thumb without foreign body without damage to nail This has healed well.  Hyperlipidemia Continue Lipitor.  Check lipid panel in 1 month.   I discussed the assessment and treatment plan with the patient. The patient was provided an opportunity to ask questions and all were answered. The patient agreed with the plan and demonstrated an understanding of the instructions.   The patient was advised to call back or seek an in-person evaluation if the symptoms worsen or if the condition fails to improve as anticipated.  I provided 15 minutes of non-face-to-face time during this encounter.   [name], MD

## 2018-12-08 NOTE — Telephone Encounter (Signed)
Please contact the patient and get him set up for lab work in 1 month.  He should have follow-up in the office in 5 months.

## 2018-12-08 NOTE — Assessment & Plan Note (Signed)
Plan to recheck in 1 month.

## 2018-12-08 NOTE — Assessment & Plan Note (Signed)
Continue Lipitor.  Check lipid panel in 1 month.

## 2018-12-09 NOTE — Telephone Encounter (Signed)
Called pt and left a VM to call back. CRM created and sent to [facility] pool. 

## 2018-12-09 NOTE — Telephone Encounter (Signed)
Called pt and left a VM to call back to schedule appts.

## 2018-12-10 ENCOUNTER — Other Ambulatory Visit (INDEPENDENT_AMBULATORY_CARE_PROVIDER_SITE_OTHER): Payer: PPO

## 2018-12-10 ENCOUNTER — Other Ambulatory Visit: Payer: Self-pay

## 2018-12-10 DIAGNOSIS — E782 Mixed hyperlipidemia: Secondary | ICD-10-CM | POA: Diagnosis not present

## 2018-12-10 LAB — COMPREHENSIVE METABOLIC PANEL
ALT: 11 U/L (ref 0–53)
AST: 23 U/L (ref 0–37)
Albumin: 4.2 g/dL (ref 3.5–5.2)
Alkaline Phosphatase: 67 U/L (ref 39–117)
BUN: 17 mg/dL (ref 6–23)
CO2: 27 mEq/L (ref 19–32)
Calcium: 9.5 mg/dL (ref 8.4–10.5)
Chloride: 107 mEq/L (ref 96–112)
Creatinine, Ser: 1.39 mg/dL (ref 0.40–1.50)
GFR: 48.65 mL/min — ABNORMAL LOW (ref >60.00)
Glucose, Bld: 94 mg/dL (ref 70–99)
Potassium: 4.1 mEq/L (ref 3.5–5.1)
Sodium: 141 mEq/L (ref 135–145)
Total Bilirubin: 0.7 mg/dL (ref 0.2–1.2)
Total Protein: 6.6 g/dL (ref 6.0–8.3)

## 2018-12-10 LAB — LIPID PANEL
Cholesterol: 131 mg/dL (ref 0–200)
HDL: 53.9 mg/dL (ref >39.00)
LDL Cholesterol: 65 mg/dL (ref 0–99)
NonHDL: 76.88
Total CHOL/HDL Ratio: 2
Triglycerides: 59 mg/dL (ref 0.0–149.0)
VLDL: 11.8 mg/dL (ref 0.0–40.0)

## 2018-12-11 NOTE — Telephone Encounter (Signed)
Pt did labwork yesterday does he still need to redo labs in 1 month?

## 2018-12-12 NOTE — Telephone Encounter (Signed)
No he does not need to redo his labs.  Thanks.

## 2018-12-14 NOTE — Telephone Encounter (Signed)
Called and spoke with pt. Pt advised and voiced understanding.  

## 2018-12-15 ENCOUNTER — Telehealth: Payer: Self-pay

## 2018-12-21 ENCOUNTER — Other Ambulatory Visit: Payer: Self-pay

## 2018-12-21 ENCOUNTER — Telehealth: Payer: Self-pay

## 2018-12-21 ENCOUNTER — Encounter: Payer: Self-pay | Admitting: Emergency Medicine

## 2018-12-21 ENCOUNTER — Emergency Department
Admission: EM | Admit: 2018-12-21 | Discharge: 2018-12-21 | Disposition: A | Discharge status: Home / Self Care | Payer: PPO | Attending: Emergency Medicine | Admitting: Emergency Medicine

## 2018-12-21 DIAGNOSIS — Y929 Unspecified place or not applicable: Secondary | ICD-10-CM | POA: Diagnosis not present

## 2018-12-21 DIAGNOSIS — I2510 Atherosclerotic heart disease of native coronary artery without angina pectoris: Secondary | ICD-10-CM | POA: Insufficient documentation

## 2018-12-21 DIAGNOSIS — S30860A Insect bite (nonvenomous) of lower back and pelvis, initial encounter: Secondary | ICD-10-CM | POA: Diagnosis not present

## 2018-12-21 DIAGNOSIS — Z856 Personal history of leukemia: Secondary | ICD-10-CM | POA: Diagnosis not present

## 2018-12-21 DIAGNOSIS — Y998 Other external cause status: Secondary | ICD-10-CM | POA: Insufficient documentation

## 2018-12-21 DIAGNOSIS — Y9389 Activity, other specified: Secondary | ICD-10-CM | POA: Diagnosis not present

## 2018-12-21 DIAGNOSIS — Z7982 Long term (current) use of aspirin: Secondary | ICD-10-CM | POA: Insufficient documentation

## 2018-12-21 DIAGNOSIS — W57XXXA Bitten or stung by nonvenomous insect and other nonvenomous arthropods, initial encounter: Secondary | ICD-10-CM | POA: Diagnosis not present

## 2018-12-21 DIAGNOSIS — I129 Hypertensive chronic kidney disease with stage 1 through stage 4 chronic kidney disease, or unspecified chronic kidney disease: Secondary | ICD-10-CM | POA: Insufficient documentation

## 2018-12-21 DIAGNOSIS — S30865A Insect bite (nonvenomous) of unspecified external genital organs, male, initial encounter: Secondary | ICD-10-CM | POA: Insufficient documentation

## 2018-12-21 DIAGNOSIS — Z951 Presence of aortocoronary bypass graft: Secondary | ICD-10-CM | POA: Insufficient documentation

## 2018-12-21 DIAGNOSIS — Z79899 Other long term (current) drug therapy: Secondary | ICD-10-CM | POA: Insufficient documentation

## 2018-12-21 DIAGNOSIS — N183 Chronic kidney disease, stage 3 (moderate): Secondary | ICD-10-CM | POA: Diagnosis not present

## 2018-12-21 MED ORDER — DOXYCYCLINE HYCLATE 100 MG PO CAPS: 100.0000 mg | ORAL_CAPSULE | Freq: Two times a day (BID) | ORAL | 0 refills | Status: AC

## 2018-12-21 NOTE — ED Provider Notes (Signed)
[facility] Emergency Department Provider Note  ____________________________________________  Time seen: Approximately 10:50 PM  I have reviewed the triage vital signs and the nursing notes.   HISTORY  Chief Complaint Insect Bite    HPI Frank Mcdaniel is a 85 y.o. male presents to the emergency department for tick removal.  Patient has rash associated with tick bite.  No fever, chills, headache or new myalgias.  Patient has tried to remove tick at home.  No other alleviating measures have been attempted.       Past Medical History:  Diagnosis Date  . Anemia 09/01/2015  . Anxiety 09/11/2015  . Arthritis   . Asthma    as a teenager  . Asymptomatic Sinus Bradycardia   . Atypical chest pain    a. 08/2014  . BPH (benign prostatic hyperplasia)   . CKD (chronic kidney disease), stage III ([place])   . Coronary artery disease    a. 2000 s/p CABG;  b. 10/2008 Neg MV, EF 64%; c. cath 05/2015: LM 85% sev cal, pLAD-1 lesion 80%, pLAD-2 lesion 100% chronic, ostLCx 70% sev cal, OM4 90%, ostRCA 70%, mid RCA 90%, LIMA-LAD patent, VG-OM2 patent, VG-OM3 patent, VG-RPDA patent. No AS  . GERD (gastroesophageal reflux disease)   . H/O echocardiogram    a. 10/2005 Echo: nl EF.  [name] Hairy cell leukemia ([place])   . HOH (hard of hearing)    Bilateral hearing aids  . HTN (hypertension)   . Hyperlipidemia   . Hypertension   . Leukemia [facility])     Patient Active Problem List   Diagnosis Date Noted  . AKI (acute kidney injury) ([place]) 09/09/2018  . Laceration of right thumb without foreign body without damage to nail 08/31/2018  . Closed fracture of tuft of distal phalanx of finger 08/31/2018  . Stricture and stenosis of esophagus   . Status post inguinal hernia repair 09/13/2016  . Constipation 05/21/2016  . Chronic nausea 02/15/2016  . Memory difficulty 02/15/2016  . Chronic fatigue 12/21/2015  . Bilateral low back pain without sciatica 12/21/2015  . Borderline low oxygen  saturation level 10/26/2015  . Anxiety 09/11/2015  . Anemia 09/01/2015  . GERD (gastroesophageal reflux disease) 06/02/2015  . Angina pectoris ([place]) 05/26/2015  . Asthma 01/28/2015  . BPH (benign prostatic hyperplasia) 01/28/2015  . Hairy cell leukemia ([place]) 01/28/2015  . Coronary artery disease   . Chest pain 09/02/2014  . S/P CABG (coronary artery bypass graft) 11/08/2010  . HTN (hypertension) 11/08/2010  . Bradycardia 11/08/2010  . Hyperlipidemia 10/27/2009    Past Surgical History:  Procedure Laterality Date  . CARDIAC CATHETERIZATION N/A 05/26/2015   Procedure: Left Heart Cath;  Surgeon: [name], MD;  Location: [place] CV LAB;  Service: Cardiovascular;  Laterality: N/A;  . CARDIAC CATHETERIZATION N/A 05/26/2015   Procedure: Coronary/Graft Angiography;  Surgeon: [name], MD;  Location: [place] CV LAB;  Service: Cardiovascular;  Laterality: N/A;  . CATARACT EXTRACTION    . CORONARY ARTERY BYPASS GRAFT  2000  . ESOPHAGOGASTRODUODENOSCOPY N/A 02/18/2017   Procedure: ESOPHAGOGASTRODUODENOSCOPY (EGD) with removal of food bolus;  Surgeon: [name], MD;  Location: [facility] ENDOSCOPY;  Service: Endoscopy;  Laterality: N/A;  . ESOPHAGOGASTRODUODENOSCOPY (EGD) WITH PROPOFOL N/A 03/11/2017   Procedure: ESOPHAGOGASTRODUODENOSCOPY (EGD) WITH PROPOFOL;  Surgeon: [name], MD;  Location: [facility] ENDOSCOPY;  Service: Endoscopy;  Laterality: N/A;  . ESOPHAGOGASTRODUODENOSCOPY (EGD) WITH PROPOFOL N/A 04/15/2017   Procedure: ESOPHAGOGASTRODUODENOSCOPY (EGD) WITH PROPOFOL;  Surgeon: [name], MD;  Location: [facility] ENDOSCOPY;  Service: Endoscopy;  Laterality: N/A;  . EYE SURGERY Left    Cataract Extraction with IOL  . INGUINAL HERNIA REPAIR Left 06/24/2016   Procedure: HERNIA REPAIR INGUINAL ADULT;  Surgeon: [name], MD;  Location: [facility] ORS;  Service: General;  Laterality: Left;  . TONSILLECTOMY  1942   . UPPER GASTROINTESTINAL ENDOSCOPY  3 years ago    with  Dilation.     Prior to Admission medications   Medication Sig Start Date End Date Taking? Authorizing Provider  aspirin 81 MG EC tablet Take 2 tablets (162 mg total) by mouth daily. 11/08/10   [name], MD  atorvastatin (LIPITOR) 40 MG tablet TAKE 1/2 TABLETS (20 MG TOTAL) BY MOUTH DAILY. 11/03/18   [name], MD  Calcium Carbonate Antacid (TUMS CHEWY BITES PO) Take 1 tablet by mouth as needed.     [provider]  doxycycline (VIBRAMYCIN) 100 MG capsule Take 1 capsule (100 mg total) by mouth 2 (two) times daily for 7 days. 12/21/18 12/28/18  Lannie Fields, PA-C  isosorbide mononitrate (IMDUR) 30 MG 24 hr tablet TAKE 1 TABLET BY MOUTH EVERY DAY 09/08/18   [name], MD  Multiple Vitamin (MULTIVITAMIN) tablet Take 1 tablet by mouth daily.      [provider]  polyethylene glycol (MIRALAX / GLYCOLAX) packet Take 17 g by mouth daily as needed.     [provider]  sulfamethoxazole-trimethoprim (BACTRIM DS,SEPTRA DS) 800-160 MG tablet Take 1 tablet by mouth 2 (two) times daily. 09/07/18   [name], MD  tamsulosin (FLOMAX) 0.4 MG CAPS capsule TAKE 1 CAPSULE (0.4 MG TOTAL) BY MOUTH DAILY. 07/16/18   Billey Co, MD    Allergies Patient has no known allergies.  Family History  Problem Relation Age of Onset  . Hypertension Mother   . Arthritis Father   . Other Other        no premature CAD.  [name] Colon cancer Neg Hx   . Prostate cancer Neg Hx   . Stomach cancer Neg Hx     Social History Social History   Tobacco Use  . Smoking status: Never Smoker  . Smokeless tobacco: Never Used  Substance Use Topics  . Alcohol use: No  . Drug use: No     Review of Systems  Constitutional: No fever/chills Eyes: No visual changes. No discharge ENT: No upper respiratory complaints. Cardiovascular: no chest pain. Respiratory: no cough. No SOB. Gastrointestinal: No abdominal pain.  No nausea, no vomiting.  No diarrhea.  No  constipation. Musculoskeletal: Negative for musculoskeletal pain. Skin: Patient has tick bite.  Neurological: Negative for headaches, focal weakness or numbness.   ____________________________________________   PHYSICAL EXAM:  VITAL SIGNS: ED Triage Vitals  Enc Vitals Group     BP 12/21/18 2111 (!) 152/63     Pulse Rate 12/21/18 2111 64     Resp 12/21/18 2111 18     Temp 12/21/18 2111 98.4 F (36.9 C)     Temp Source 12/21/18 2111 Oral     SpO2 12/21/18 2111 99 %     Weight 12/21/18 2108 160 lb (72.6 kg)     Height --      Head Circumference --      Peak Flow --      Pain Score 12/21/18 2107 0     Pain Loc --      Pain Edu? --      Excl. in [place]? --  Constitutional: Alert and oriented. Well appearing and in no acute distress. Eyes: Conjunctivae are normal. PERRL. EOMI. Head: Atraumatic. Cardiovascular: Normal rate, regular rhythm. Normal S1 and S2.  Good peripheral circulation. Respiratory: Normal respiratory effort without tachypnea or retractions. Lungs CTAB. Good air entry to the bases with no decreased or absent breath sounds. Musculoskeletal: Full range of motion to all extremities. No gross deformities appreciated. Neurologic:  Normal speech and language. No gross focal neurologic deficits are appreciated.  Skin: Patient has tick embedded along right groin with 1 cm of surrounding cellulitis. Psychiatric: Mood and affect are normal. Speech and behavior are normal. Patient exhibits appropriate insight and judgement.   ____________________________________________   LABS (all labs ordered are listed, but only abnormal results are displayed)  Labs Reviewed - No data to display ____________________________________________  EKG   ____________________________________________  RADIOLOGY   No results found.  ____________________________________________    PROCEDURES  Procedure(s) performed:    Procedures  Tick was removed using forceps and an  18-gauge needle  Medications - No data to display   ____________________________________________   INITIAL IMPRESSION / ASSESSMENT AND PLAN / ED COURSE  Pertinent labs & imaging results that were available during my care of the patient were reviewed by me and considered in my medical decision making (see chart for details).  Review of the [place] CSRS was performed in accordance of the [place] prior to dispensing any controlled drugs.           Assessment and plan Tick removal 85 year old male presents to the emergency department for tick removal which was witnessed by chaperone.  Tick was removed without complication using forceps and 18-gauge needle.  Patient was discharged with doxycycline and advised to follow-up with primary care as needed.  All patient questions were answered.    ____________________________________________  FINAL CLINICAL IMPRESSION(S) / ED DIAGNOSES  Final diagnoses:  Tick bite, initial encounter      NEW MEDICATIONS STARTED DURING THIS VISIT:  ED Discharge Orders         Ordered    doxycycline (VIBRAMYCIN) 100 MG capsule  2 times daily     12/21/18 2211              This chart was dictated using voice recognition software/Dragon. Despite best efforts to proofread, errors can occur which can change the meaning. Any change was purely unintentional.    Lannie Fields, PA-C 12/21/18 2252    Nena Polio, MD 12/21/18 [phone]

## 2018-12-21 NOTE — ED Triage Notes (Signed)
Patient ambulatory to triage with steady gait, without difficulty or distress noted, mask in place; pt reports tick noted to right groin today

## 2018-12-22 ENCOUNTER — Encounter: Payer: Self-pay | Admitting: Family Medicine

## 2018-12-22 ENCOUNTER — Telehealth: Payer: Self-pay

## 2018-12-22 NOTE — Telephone Encounter (Signed)
Copied from [place] [id number]. Topic: General - Other >> Dec 22, 2018  9:05 AM Virl Axe D wrote: Reason for CRM: Pt's son stated they took pt to the ED last night to have a tick removed. They were advised to let Dr. Caryl Bis know. He was given antibiotics to take and doing well.

## 2018-12-22 NOTE — Telephone Encounter (Signed)
Called and informed patient to take all of his antibiotics and to call if the bite gets red or worse.  Nina,cma

## 2018-12-22 NOTE — Telephone Encounter (Signed)
Noted.  Patient needs to complete the antibiotics as prescribed.  If the area of redness where the tick was worsens or does not improve he need to be evaluated immediately.

## 2018-12-23 NOTE — Telephone Encounter (Signed)
Please call the patients son and see if the redness has improved from yesterday to today. Has the redness spread at all? Does he have fever? Has there been any pain in the area?  The picture he sent looks like it could be a local site reaction with the area right around the tick bite though there is some other redness around that. Please see if that redness has improved. Thanks.

## 2018-12-23 NOTE — Telephone Encounter (Signed)
Sent to PCP to review and advised.

## 2018-12-24 NOTE — Telephone Encounter (Signed)
Called and spoke with pt's son. Son stated that the rash is about the same is not spreading, no warmth to touch, no fever, no pain.   Sent to PCP to advise

## 2018-12-24 NOTE — Telephone Encounter (Signed)
I would advise that he continue the antibiotic and monitor the rash. If it spreads he needs to be looked at in person. If it is not improving over the next 2-3 days he needs to be looked at in person as well.

## 2019-01-14 ENCOUNTER — Telehealth: Payer: Self-pay

## 2019-01-14 ENCOUNTER — Other Ambulatory Visit: Payer: PPO

## 2019-01-14 DIAGNOSIS — Z20822 Contact with and (suspected) exposure to covid-19: Secondary | ICD-10-CM

## 2019-01-14 NOTE — Telephone Encounter (Signed)
T for Covid testing @ 3:30 lephone call to Son Schedule the Patient 3:30pm later today6/06/2019.  Son voices understanding. Covid test ordered

## 2019-01-16 LAB — NOVEL CORONAVIRUS, NAA: SARS-CoV-2, NAA: NOT DETECTED

## 2019-02-09 ENCOUNTER — Other Ambulatory Visit: Payer: Self-pay

## 2019-02-09 ENCOUNTER — Inpatient Hospital Stay: Payer: PPO | Attending: Oncology

## 2019-02-09 DIAGNOSIS — Z9225 Personal history of immunosupression therapy: Secondary | ICD-10-CM | POA: Diagnosis not present

## 2019-02-09 DIAGNOSIS — C9141 Hairy cell leukemia, in remission: Secondary | ICD-10-CM | POA: Diagnosis not present

## 2019-02-09 LAB — CBC WITH DIFFERENTIAL/PLATELET
Abs Immature Granulocytes: 0.03 10*3/uL (ref 0.00–0.07)
Basophils Absolute: 0 10*3/uL (ref 0.0–0.1)
Basophils Relative: 0 %
Eosinophils Absolute: 0.3 10*3/uL (ref 0.0–0.5)
Eosinophils Relative: 7 %
HCT: 36.8 % — ABNORMAL LOW (ref 39.0–52.0)
Hemoglobin: 12 g/dL — ABNORMAL LOW (ref 13.0–17.0)
Immature Granulocytes: 1 %
Lymphocytes Relative: 20 %
Lymphs Abs: 0.9 10*3/uL (ref 0.7–4.0)
MCH: 30.7 pg (ref 26.0–34.0)
MCHC: 32.6 g/dL (ref 30.0–36.0)
MCV: 94.1 fL (ref 80.0–100.0)
Monocytes Absolute: 0.4 10*3/uL (ref 0.1–1.0)
Monocytes Relative: 8 %
Neutro Abs: 3 10*3/uL (ref 1.7–7.7)
Neutrophils Relative %: 64 %
Platelets: 147 10*3/uL — ABNORMAL LOW (ref 150–400)
RBC: 3.91 MIL/uL — ABNORMAL LOW (ref 4.22–5.81)
RDW: 13.2 % (ref 11.5–15.5)
WBC: 4.7 10*3/uL (ref 4.0–10.5)
nRBC: 0 % (ref 0.0–0.2)

## 2019-03-26 NOTE — Telephone Encounter (Signed)
err

## 2019-05-12 ENCOUNTER — Ambulatory Visit: Payer: Medicare Other | Admitting: Family Medicine

## 2019-05-12 ENCOUNTER — Ambulatory Visit: Payer: PPO | Admitting: Family Medicine

## 2019-05-12 ENCOUNTER — Ambulatory Visit: Payer: Medicare Other

## 2019-05-24 ENCOUNTER — Telehealth: Payer: Self-pay

## 2019-05-24 NOTE — Telephone Encounter (Signed)
Copied from [place] [id number]. Topic: General - Inquiry >> May 24, 2019  8:54 AM Mathis Bud wrote: Reason for CRM: Patient son [name] is requesting a call from Sharlene Dory before his AWV on Friday 10/23. Call back [phone]

## 2019-05-25 NOTE — Telephone Encounter (Signed)
Returned call and inquiring if OTC prevagen or which Rx medication is a good choice to assist with memory loss. Son, [name] would like to discuss this option with pcp at appointment on 10/23 and/or referral for baseline neurology memory assessment. No distress.

## 2019-05-26 ENCOUNTER — Other Ambulatory Visit: Payer: Self-pay

## 2019-05-26 NOTE — Telephone Encounter (Signed)
Noted.  We can discuss further at his follow-up Friday.

## 2019-05-28 ENCOUNTER — Ambulatory Visit (INDEPENDENT_AMBULATORY_CARE_PROVIDER_SITE_OTHER): Payer: PPO

## 2019-05-28 ENCOUNTER — Ambulatory Visit (INDEPENDENT_AMBULATORY_CARE_PROVIDER_SITE_OTHER): Payer: PPO | Admitting: Family Medicine

## 2019-05-28 ENCOUNTER — Telehealth: Payer: Self-pay

## 2019-05-28 ENCOUNTER — Other Ambulatory Visit: Payer: Self-pay

## 2019-05-28 ENCOUNTER — Encounter: Payer: Self-pay | Admitting: Family Medicine

## 2019-05-28 VITALS — BP 120/80 | HR 62 | Temp 98.2°F | Ht 64.0 in | Wt 170.8 lb

## 2019-05-28 DIAGNOSIS — Z135 Encounter for screening for eye and ear disorders: Secondary | ICD-10-CM | POA: Diagnosis not present

## 2019-05-28 DIAGNOSIS — Z23 Encounter for immunization: Secondary | ICD-10-CM

## 2019-05-28 DIAGNOSIS — R351 Nocturia: Secondary | ICD-10-CM | POA: Diagnosis not present

## 2019-05-28 DIAGNOSIS — F0390 Unspecified dementia without behavioral disturbance: Secondary | ICD-10-CM | POA: Insufficient documentation

## 2019-05-28 DIAGNOSIS — G309 Alzheimer's disease, unspecified: Secondary | ICD-10-CM | POA: Diagnosis not present

## 2019-05-28 DIAGNOSIS — Z0000 Encounter for general adult medical examination without abnormal findings: Secondary | ICD-10-CM | POA: Diagnosis not present

## 2019-05-28 DIAGNOSIS — I2510 Atherosclerotic heart disease of native coronary artery without angina pectoris: Secondary | ICD-10-CM

## 2019-05-28 DIAGNOSIS — N401 Enlarged prostate with lower urinary tract symptoms: Secondary | ICD-10-CM | POA: Diagnosis not present

## 2019-05-28 HISTORY — DX: Unspecified dementia, unspecified severity, without behavioral disturbance, psychotic disturbance, mood disturbance, and anxiety: F03.90

## 2019-05-28 LAB — COMPREHENSIVE METABOLIC PANEL
ALT: 12 U/L (ref 0–53)
AST: 22 U/L (ref 0–37)
Albumin: 4.3 g/dL (ref 3.5–5.2)
Alkaline Phosphatase: 67 U/L (ref 39–117)
BUN: 16 mg/dL (ref 6–23)
CO2: 27 mEq/L (ref 19–32)
Calcium: 9.5 mg/dL (ref 8.4–10.5)
Chloride: 106 mEq/L (ref 96–112)
Creatinine, Ser: 1.34 mg/dL (ref 0.40–1.50)
GFR: 50.7 mL/min — ABNORMAL LOW (ref >60.00)
Glucose, Bld: 88 mg/dL (ref 70–99)
Potassium: 4.1 mEq/L (ref 3.5–5.1)
Sodium: 140 mEq/L (ref 135–145)
Total Bilirubin: 0.5 mg/dL (ref 0.2–1.2)
Total Protein: 6.5 g/dL (ref 6.0–8.3)

## 2019-05-28 LAB — TSH: TSH: 0.65 u[IU]/mL (ref 0.35–4.50)

## 2019-05-28 LAB — VITAMIN B12: Vitamin B-12: 334 pg/mL (ref 211–911)

## 2019-05-28 NOTE — Patient Instructions (Signed)
Nice to see you. Please change the flomax to night time. If this is not beneficial please let us know in one week.  We will call with your results and then determine medication needs.

## 2019-05-28 NOTE — Assessment & Plan Note (Signed)
Asymptomatic.  He will continue his current regimen. 

## 2019-05-28 NOTE — Patient Instructions (Addendum)
  Frank Mcdaniel , Thank you for taking time to come for your Medicare Wellness Visit. I appreciate your ongoing commitment to your health goals. Please review the following plan we discussed and let me know if I can assist you in the future.   These are the goals we discussed: Goals    . Follow up with Primary Care Provider     As needed and maintain good health       This is a list of the screening recommended for you and due dates:  Health Maintenance  Topic Date Due  . Pneumonia vaccines (2 of 2 - PPSV23) 11/04/2018  . Tetanus Vaccine  11/04/2027  . Flu Shot  Completed

## 2019-05-28 NOTE — Assessment & Plan Note (Signed)
Patient with progression of his memory difficulties.  We will check labs to evaluate for underlying cause.  MRI ordered.  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  We will check orbital x-ray to prior to the MRI.  Once his labs returned we could consider referral to neurology or treatment of underlying issue.

## 2019-05-28 NOTE — Progress Notes (Signed)
Subjective:   Frank Mcdaniel is a 85 y.o. male who presents for Medicare Annual/Subsequent preventive examination.  Review of Systems:  No ROS.  Medicare Wellness Virtual Visit.  Visual/audio telehealth visit, UTA vital signs.   See social history for additional risk factors.   Cardiac Risk Factors include: advanced age (>62men, >29 women);male gender;hypertension     Objective:    Vitals: There were no vitals taken for this visit.  There is no height or weight on file to calculate BMI.  Advanced Directives 05/28/2019 12/21/2018 08/11/2018 05/06/2018 07/22/2017 05/05/2017 04/15/2017  Does Patient Have a Medical Advance Directive? No No No No Yes Yes Yes  Type of Advance Directive - - - - Press photographer;Living will [place];Living will [place];Living will  Does patient want to make changes to medical advance directive? - - No - Patient declined Yes (MAU/Ambulatory/Procedural Areas - Information given) No - Patient declined No - Patient declined -  Copy of [place] in Chart? - - - - No - copy requested No - copy requested No - copy requested  Would patient like information on creating a medical advance directive? No - Patient declined No - Patient declined No - Patient declined - - - -    Tobacco Social History   Tobacco Use  Smoking Status Never Smoker  Smokeless Tobacco Never Used     Counseling given: Not Answered   Clinical Intake:  Pre-visit preparation completed: Yes        Diabetes: No  How often do you need to have someone help you when you read instructions, pamphlets, or other written materials from your doctor or pharmacy?: 1 - Never  Interpreter Needed?: No     Past Medical History:  Diagnosis Date  . Anemia 09/01/2015  . Anxiety 09/11/2015  . Arthritis   . Asthma    as a teenager  . Asymptomatic Sinus Bradycardia   . Atypical chest pain    a. 08/2014  . BPH (benign prostatic  hyperplasia)   . CKD (chronic kidney disease), stage III   . Coronary artery disease    a. 2000 s/p CABG;  b. 10/2008 Neg MV, EF 64%; c. cath 05/2015: LM 85% sev cal, pLAD-1 lesion 80%, pLAD-2 lesion 100% chronic, ostLCx 70% sev cal, OM4 90%, ostRCA 70%, mid RCA 90%, LIMA-LAD patent, VG-OM2 patent, VG-OM3 patent, VG-RPDA patent. No AS  . GERD (gastroesophageal reflux disease)   . H/O echocardiogram    a. 10/2005 Echo: nl EF.  [name] Hairy cell leukemia ([place])   . HOH (hard of hearing)    Bilateral hearing aids  . HTN (hypertension)   . Hyperlipidemia   . Hypertension   . Leukemia [facility])    Past Surgical History:  Procedure Laterality Date  . CARDIAC CATHETERIZATION N/A 05/26/2015   Procedure: Left Heart Cath;  Surgeon: [name], MD;  Location: [place] CV LAB;  Service: Cardiovascular;  Laterality: N/A;  . CARDIAC CATHETERIZATION N/A 05/26/2015   Procedure: Coronary/Graft Angiography;  Surgeon: [name], MD;  Location: [place] CV LAB;  Service: Cardiovascular;  Laterality: N/A;  . CATARACT EXTRACTION    . CORONARY ARTERY BYPASS GRAFT  2000  . ESOPHAGOGASTRODUODENOSCOPY N/A 02/18/2017   Procedure: ESOPHAGOGASTRODUODENOSCOPY (EGD) with removal of food bolus;  Surgeon: [name], MD;  Location: [facility] ENDOSCOPY;  Service: Endoscopy;  Laterality: N/A;  . ESOPHAGOGASTRODUODENOSCOPY (EGD) WITH PROPOFOL N/A 03/11/2017   Procedure: ESOPHAGOGASTRODUODENOSCOPY (EGD) WITH PROPOFOL;  Surgeon: [name], MD;  Location: [facility] ENDOSCOPY;  Service: Endoscopy;  Laterality: N/A;  . ESOPHAGOGASTRODUODENOSCOPY (EGD) WITH PROPOFOL N/A 04/15/2017   Procedure: ESOPHAGOGASTRODUODENOSCOPY (EGD) WITH PROPOFOL;  Surgeon: [name], MD;  Location: [facility] ENDOSCOPY;  Service: Endoscopy;  Laterality: N/A;  . EYE SURGERY Left    Cataract Extraction with IOL  . INGUINAL HERNIA REPAIR Left 06/24/2016   Procedure: HERNIA REPAIR INGUINAL ADULT;  Surgeon: [name], MD;  Location: [facility] ORS;   Service: General;  Laterality: Left;  . TONSILLECTOMY  1942   . UPPER GASTROINTESTINAL ENDOSCOPY  3 years ago    with Dilation.    Family History  Problem Relation Age of Onset  . Hypertension Mother   . Arthritis Father   . Other Other        no premature CAD.  [name] Colon cancer Neg Hx   . Prostate cancer Neg Hx   . Stomach cancer Neg Hx    Social History   Socioeconomic History  . Marital status: Widowed    Spouse name: Not on file  . Number of children: 1  . Years of education: Not on file  . Highest education level: Not on file  Occupational History  . Occupation: retired  Scientific laboratory technician  . Financial resource strain: Not hard at all  . Food insecurity    Worry: Never true    Inability: Never true  . Transportation needs    Medical: No    Non-medical: No  Tobacco Use  . Smoking status: Never Smoker  . Smokeless tobacco: Never Used  Substance and Sexual Activity  . Alcohol use: No  . Drug use: No  . Sexual activity: Never  Lifestyle  . Physical activity    Days per week: 0 days    Minutes per session: Not on file  . Stress: Not at all  Relationships  . Social Herbalist on phone: Not on file    Gets together: Not on file    Attends religious service: Not on file    Active member of club or organization: Not on file    Attends meetings of clubs or organizations: Not on file    Relationship status: Not on file  Other Topics Concern  . Not on file  Social History Narrative   Lives locally with son.  Fairly active around the house though does not routinely exercise.  Retired from [employer].    Outpatient Encounter Medications as of 05/28/2019  Medication Sig  . aspirin 81 MG EC tablet Take 2 tablets (162 mg total) by mouth daily.  [name] atorvastatin (LIPITOR) 40 MG tablet TAKE 1/2 TABLETS (20 MG TOTAL) BY MOUTH DAILY.  . Calcium Carbonate Antacid (TUMS CHEWY BITES PO) Take 1 tablet by mouth as needed.   . isosorbide mononitrate (IMDUR) 30 MG 24 hr tablet  TAKE 1 TABLET BY MOUTH EVERY DAY  . Multiple Vitamin (MULTIVITAMIN) tablet Take 1 tablet by mouth daily.    . polyethylene glycol (MIRALAX / GLYCOLAX) packet Take 17 g by mouth daily as needed.   . sulfamethoxazole-trimethoprim (BACTRIM DS,SEPTRA DS) 800-160 MG tablet Take 1 tablet by mouth 2 (two) times daily.  . tamsulosin (FLOMAX) 0.4 MG CAPS capsule TAKE 1 CAPSULE (0.4 MG TOTAL) BY MOUTH DAILY.   No facility-administered encounter medications on file as of 05/28/2019.     Activities of Daily Living In your present state of health, do you have any difficulty performing the following activities: 05/28/2019  Hearing?  Y  Comment Hearing aid  Vision? N  Difficulty concentrating or making decisions? Y  Comment Notes difficulty remembering  Walking or climbing stairs? N  Dressing or bathing? N  Doing errands, shopping? N  Preparing Food and eating ? Y  Comment He does not cook stove top. Microwave use only. Son assist. Self feed.  Using the Toilet? N  In the past six months, have you accidently leaked urine? Y  Comment Followed by pcp and urologist  Do you have problems with loss of bowel control? N  Managing your Medications? N  Managing your Finances? N  Housekeeping or managing your Housekeeping? N  Some recent data might be hidden    Patient Care Team: [name], MD as PCP - General (Family Medicine) Rockey Situ, Kathlene November, MD as Consulting Physician (Cardiology) [name], MD as Consulting Physician (Oncology) [name] as Physician Assistant (Urology)   Assessment:   This is a routine wellness examination for [place].  Nurse connected with patient and his son [name] 05/28/19 at  9:00 AM EDT by phone enabled telemedicine application and verified that I am speaking with the correct person using two identifiers. Patient stated full name and DOB. Patient gave permission to continue with virtual visit. Patient's location was at home and Nurse's  location was at [place] office.   Health Maintenance Due: -PNA 23- discussed; to be completed with doctor in visit or local pharmacy.  Update all pending maintenance due as appropriate.   See completed HM at the end of note.   Eye: Visual acuity not assessed. Virtual visit. Wears corrective lenses. Followed by their ophthalmologist every 12 months.   Dental: Visits every 6 months.    Hearing: Hearing aids- yes  Safety:  Patient feels safe at home- yes Patient does have smoke detectors at home- yes Patient does wear sunscreen or protective clothing when in direct sunlight - yes Patient does wear seat belt when in a moving vehicle - yes Patient drives- limited Adequate lighting in walkways free from debris- yes Grab bars and handrails used as appropriate- yes Ambulates with no assistive device  Social: Alcohol intake - no   Smoking history- never   Smokers in home? none Illicit drug use? none  Depression: PHQ 2 &9 complete. See screening below. Denies irritability, anhedonia, sadness/tearfullness.  Stable.   Falls: See screening below.    Medication: Taking as directed and without issues. Pill box in use.  Covid-19: Precautions and sickness symptoms discussed. Wears mask, social distancing, hand hygiene as appropriate.   Activities of Daily Living Patient denies needing assistance with: household chores, feeding themselves, getting from bed to chair, getting to the toilet, bathing/showering, dressing, and finances.  Assisted by son, [name] with home/meal management as needed.   Memory: Patient is alert. Deferred for follow up with pcp per patient preference. He enjoys reading the newspaper and consumer reports for brain stimulation.   BMI- discussed the importance of a healthy diet, water intake and the benefits of aerobic exercise.  Educational material provided.  Physical activity- yard work, no routine  Diet: Regular  Water: good intake  Other Providers  Patient Care Team: [name], MD as PCP - General (Family Medicine) [name], MD as Consulting Physician (Cardiology) [name], MD as Consulting Physician (Oncology) [name] as Physician Assistant (Urology)  Exercise Activities and Dietary recommendations Current Exercise Habits: The patient does not participate in regular exercise at present  Goals    .  Follow up with Primary Care Provider     As needed and maintain good health       Fall Risk Fall Risk  05/28/2019 05/06/2018 05/05/2017 04/23/2016  Falls in the past year? 0 No No No  Timed Get Up and Go Performed: no, virtual visit  Depression Screen PHQ 2/9 Scores 05/28/2019 05/06/2018 05/05/2017 04/23/2016  PHQ - 2 Score 0 0 0 0  PHQ- 9 Score - - 0 -    Cognitive Function MMSE - Mini Mental State Exam 04/23/2016  Orientation to time 5  Orientation to Place 5  Registration 3  Attention/ Calculation 5  Recall 3  Language- name 2 objects 2  Language- repeat 1  Language- follow 3 step command 3  Language- read & follow direction 1  Write a sentence 1  Copy design 1  Total score 30     6CIT Screen 05/06/2018 05/05/2017  What Year? 0 points 0 points  What month? 0 points 0 points  What time? 0 points 0 points  Count back from 20 0 points 0 points  Months in reverse 0 points 0 points  Repeat phrase 0 points 2 points  Total Score 0 2    Immunization History  Administered Date(s) Administered  . Fluad Quad(high Dose 65+) 05/04/2019  . Influenza, High Dose Seasonal PF 05/12/2017, 04/23/2018  . Influenza,inj,quad, With Preservative 05/06/2018  . Influenza-Unspecified 04/05/2016, 05/12/2017  . Pneumococcal Conjugate-13 11/03/2017  . Tdap 11/03/2017  . Zoster Recombinat (Shingrix) 05/11/2019   Screening Tests Health Maintenance  Topic Date Due  . PNA vac Low Risk Adult (2 of 2 - PPSV23) 11/04/2018  . TETANUS/TDAP  11/04/2027  . INFLUENZA VACCINE  Completed       Plan:     Keep all routine maintenance appointments.   Follow up with your doctor today at [facility] I have personally reviewed: The patient's medical and social history Their use of alcohol, tobacco or illicit drugs Their current medications and supplements The patient's functional ability including ADLs,fall risks, home safety risks, cognitive, and hearing and visual impairment Diet and physical activities Evidence for depression   In addition, I have reviewed and discussed with patient certain preventive protocols, quality metrics, and best practice recommendations. A written personalized care plan for preventive services as well as general preventive health recommendations were provided to patient via mail.     Varney Biles, LPN  [id number]

## 2019-05-28 NOTE — Telephone Encounter (Signed)
Copied from [place] [id number]. Topic: General - Other >> May 28, 2019  2:08 PM Antonieta Iba C wrote: Reason for CRM: pt's son called in to request a call back in regards to pt's medication list   Please call and assist

## 2019-05-28 NOTE — Assessment & Plan Note (Addendum)
Continue Flomax.  They will try having to take it at night.  If that is not beneficial for decreasing his nocturia they will contact us in about a week.

## 2019-05-28 NOTE — Telephone Encounter (Signed)
I called and spoke with the son and he informed me that the medication he requested to remove was incorrect and he wanted me to add it back and I added the medication back.  Nina,cma

## 2019-05-28 NOTE — Progress Notes (Signed)
[name], MD Phone: [phone]  Frank Mcdaniel is a 85 y.o. male who presents today for follow-up.  CAD: No chest pain or shortness of breath.  Continues on Imdur.  Memory difficulty: Patient and his son both note issues with short-term memory.  He is not able to remember things his son has been telling him.  He has no long-term issues.  He does have some obsessive worrying about things that are out of normal.  He is able to fix his own food and dresses himself.  He goes to the bathroom and bathes himself.  He does drive some though no wandering or getting lost.  He notes no depression.  Occasional anxiety.  MMSE - Mini Mental State Exam 05/28/2019 04/23/2016  Orientation to time 3 5  Orientation to Place 5 5  Registration 3 3  Attention/ Calculation 0 5  Recall 1 3  Language- name 2 objects 2 2  Language- repeat 1 1  Language- follow 3 step command 1 3  Language- read & follow direction 1 1  Write a sentence 1 1  Copy design 1 1  Total score 19 30    BPH: Taking Flomax.  He does have nocturia 3 times per night.  Some frequency and urgency during the day.  No incontinence.  He does fully empty his bladder.  His son reports there are some stream issues as he can hear dribbling at times.  No straining.  Social History   Tobacco Use  Smoking Status Never Smoker  Smokeless Tobacco Never Used     ROS see history of present illness  Objective  Physical Exam Vitals:   05/28/19 1034  BP: 120/80  Pulse: 62  Temp: 98.2 F (36.8 C)  SpO2: 97%    BP Readings from Last 3 Encounters:  05/28/19 120/80  12/21/18 (!) 152/63  09/09/18 102/66   Wt Readings from Last 3 Encounters:  05/28/19 170 lb 12.8 oz (77.5 kg)  12/21/18 160 lb (72.6 kg)  09/09/18 165 lb 6.4 oz (75 kg)    Physical Exam Constitutional:      General: He is not in acute distress.    Appearance: He is not diaphoretic.  Cardiovascular:     Rate and Rhythm: Normal rate and regular rhythm.   Heart sounds: Normal heart sounds.  Pulmonary:     Effort: Pulmonary effort is normal.     Breath sounds: Normal breath sounds.  Musculoskeletal:     Right lower leg: No edema.     Left lower leg: No edema.  Skin:    General: Skin is warm and dry.  Neurological:     Mental Status: He is alert.      Assessment/Plan: Please see individual problem list.  Coronary artery disease Asymptomatic.  He will continue his current regimen.  Dementia without behavioral disturbance [facility]) Patient with progression of his memory difficulties.  We will check labs to evaluate for underlying cause.  MRI ordered.  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  We will check orbital x-ray to prior to the MRI.  Once his labs returned we could consider referral to neurology or treatment of underlying issue.  BPH (benign prostatic hyperplasia) Continue Flomax.  They will try having to take it at night.  If that is not beneficial for decreasing his nocturia they will contact us in about a week.    Orders Placed This Encounter  Procedures  . MR  Brain Wo Contrast    Standing Status:   Future    Standing Expiration Date:   07/27/2020    Order Specific Question:   What is the patient's sedation requirement?    Answer:   No Sedation    Order Specific Question:   Does the patient have a pacemaker or implanted devices?    Answer:   No    Order Specific Question:   Preferred imaging location?    Answer:   [facility] (table limit-400lbs)    Order Specific Question:   Radiology Contrast Protocol - do NOT remove file path    Answer:   \\[email or web address]\Radiant\[email or web address].PDF  . DG Eye Foreign Body    Standing Status:   Future    Number of Occurrences:   1    Standing Expiration Date:   07/27/2020    Order Specific Question:   Reason for Exam (SYMPTOM  OR DIAGNOSIS REQUIRED)    Answer:   pre-MRI x-ray, worked in Nutritional therapist  years ago, no known orbit issues from his prior work    Environmental health practitioner Question:   Preferred imaging location?    Answer:   [facility] Specific Question:   Radiology Contrast Protocol - do NOT remove file path    Answer:   \\[email or web address]\Radiant\DXFluoroContrastProtocols.pdf  . Pneumococcal polysaccharide vaccine 23-valent greater than or equal to 2yo subcutaneous/IM  . B12  . TSH  . Comp Met (CMET)    No orders of the defined types were placed in this encounter.    [name], MD [place]

## 2019-05-29 NOTE — Progress Notes (Signed)
I have reviewed the above note and agree.  Bryon Parker, M.D.  

## 2019-06-05 ENCOUNTER — Other Ambulatory Visit: Payer: Self-pay | Admitting: Family Medicine

## 2019-06-05 DIAGNOSIS — R413 Other amnesia: Secondary | ICD-10-CM

## 2019-06-17 ENCOUNTER — Ambulatory Visit
Admission: RE | Admit: 2019-06-17 | Discharge: 2019-06-17 | Disposition: A | Discharge status: Home / Self Care | Payer: PPO | Source: Ambulatory Visit | Attending: Family Medicine | Admitting: Family Medicine

## 2019-06-17 ENCOUNTER — Other Ambulatory Visit: Payer: Self-pay

## 2019-06-17 DIAGNOSIS — R413 Other amnesia: Secondary | ICD-10-CM | POA: Diagnosis not present

## 2019-06-17 DIAGNOSIS — G309 Alzheimer's disease, unspecified: Secondary | ICD-10-CM

## 2019-06-28 DIAGNOSIS — R413 Other amnesia: Secondary | ICD-10-CM | POA: Diagnosis not present

## 2019-07-09 NOTE — Telephone Encounter (Signed)
Message sent to CMA to call patient at a different number. Chareese Sergent,cma

## 2019-07-20 NOTE — Telephone Encounter (Signed)
Opened in Error.

## 2019-07-22 ENCOUNTER — Other Ambulatory Visit: Payer: Self-pay | Admitting: Urology

## 2019-07-22 ENCOUNTER — Other Ambulatory Visit: Payer: Self-pay | Admitting: *Deleted

## 2019-07-22 ENCOUNTER — Telehealth: Payer: Self-pay | Admitting: Urology

## 2019-07-22 DIAGNOSIS — N400 Enlarged prostate without lower urinary tract symptoms: Secondary | ICD-10-CM

## 2019-07-22 MED ORDER — TAMSULOSIN HCL 0.4 MG PO CAPS: ORAL_CAPSULE | ORAL | 3 refills | Status: DC

## 2019-07-22 NOTE — Telephone Encounter (Signed)
Patient need to get this refill from his PCP . This way the last time we may refill. Advised patient son.

## 2019-07-22 NOTE — Telephone Encounter (Signed)
Pt's son called office and wants to know if pt needs to make appt, or if [facility] will refill Tamsulosin.  If pt does need appt, son would like for it to be a phone call.

## 2019-08-02 ENCOUNTER — Other Ambulatory Visit: Payer: Self-pay

## 2019-08-02 ENCOUNTER — Encounter: Payer: Self-pay | Admitting: Family Medicine

## 2019-08-02 ENCOUNTER — Ambulatory Visit (INDEPENDENT_AMBULATORY_CARE_PROVIDER_SITE_OTHER): Payer: PPO | Admitting: Family Medicine

## 2019-08-02 DIAGNOSIS — F0390 Unspecified dementia without behavioral disturbance: Secondary | ICD-10-CM | POA: Diagnosis not present

## 2019-08-02 DIAGNOSIS — N400 Enlarged prostate without lower urinary tract symptoms: Secondary | ICD-10-CM

## 2019-08-02 DIAGNOSIS — K589 Irritable bowel syndrome without diarrhea: Secondary | ICD-10-CM | POA: Insufficient documentation

## 2019-08-02 DIAGNOSIS — K582 Mixed irritable bowel syndrome: Secondary | ICD-10-CM

## 2019-08-02 DIAGNOSIS — I2510 Atherosclerotic heart disease of native coronary artery without angina pectoris: Secondary | ICD-10-CM

## 2019-08-02 MED ORDER — TAMSULOSIN HCL 0.4 MG PO CAPS: ORAL_CAPSULE | ORAL | 3 refills | Status: DC

## 2019-08-02 NOTE — Progress Notes (Signed)
Virtual Visit via telephone Note  This visit type was conducted due to national recommendations for restrictions regarding the COVID-19 pandemic (e.g. social distancing).  This format is felt to be most appropriate for this patient at this time.  All issues noted in this document were discussed and addressed.  No physical exam was performed (except for noted visual exam findings with Video Visits).   I connected with Frank Mcdaniel today at  8:30 AM EST by telephone and verified that I am speaking with the correct person using two identifiers. Location patient: home Location provider: work Persons participating in the virtual visit: patient, provider, Avelardo Killmer (son)  I discussed the limitations, risks, security and privacy concerns of performing an evaluation and management service by telephone and the availability of in person appointments. I also discussed with the patient that there may be a patient responsible charge related to this service. The patient expressed understanding and agreed to proceed.  Interactive audio and video telecommunications were attempted between this provider and patient, however failed, due to patient having technical difficulties OR patient did not have access to video capability.  We continued and completed visit with audio only.   Reason for visit: follow-up  HPI: BPH: Strain- no Flow- weak Frequency- yes Urgency- some Nocturia- 3x/night Dysuria- no Emptying bladder- yes Incontinence- 1x/3 days, small amount Medication- flomax 0.4 mg PO daily  CAD: no CP or SOB. Taking imdur and lipitor.   IBS: Patient has seemed to have IBS constipation predominant type in the past though recently has been having 1-2 bowel movements a day and some stool leakage at night that is occurring with urgency and is slightly loose.  No liquid stools.  He does take MiraLAX as needed.  He notes the urgency and leakage does not occur every night.  It has been going on for a  month.  Does not occur during the day.  Memory difficulty: He saw neurology.  They placed him on Aricept.  His son notes his memory has not worsened.  The patient reads the newspaper and stays physically active in the house.   ROS: See pertinent positives and negatives per HPI.  Past Medical History:  Diagnosis Date  . Anemia 09/01/2015  . Anxiety 09/11/2015  . Arthritis   . Asthma    as a teenager  . Asymptomatic Sinus Bradycardia   . Atypical chest pain    a. 08/2014  . BPH (benign prostatic hyperplasia)   . CKD (chronic kidney disease), stage III   . Closed fracture of tuft of distal phalanx of finger 08/31/2018  . Coronary artery disease    a. 2000 s/p CABG;  b. 10/2008 Neg MV, EF 64%; c. cath 05/2015: LM 85% sev cal, pLAD-1 lesion 80%, pLAD-2 lesion 100% chronic, ostLCx 70% sev cal, OM4 90%, ostRCA 70%, mid RCA 90%, LIMA-LAD patent, VG-OM2 patent, VG-OM3 patent, VG-RPDA patent. No AS  . Dementia without behavioral disturbance ([place]) 05/28/2019  . GERD (gastroesophageal reflux disease)   . H/O echocardiogram    a. 10/2005 Echo: nl EF.  [name] Hairy cell leukemia ([place])   . HOH (hard of hearing)    Bilateral hearing aids  . HTN (hypertension)   . Hyperlipidemia   . Hypertension   . Leukemia ([place])   . Stricture and stenosis of esophagus     Past Surgical History:  Procedure Laterality Date  . CARDIAC CATHETERIZATION N/A 05/26/2015   Procedure: Left Heart Cath;  Surgeon: [name], MD;  Location: [facility]  INVASIVE CV LAB;  Service: Cardiovascular;  Laterality: N/A;  . CARDIAC CATHETERIZATION N/A 05/26/2015   Procedure: Coronary/Graft Angiography;  Surgeon: [name], MD;  Location: [place] CV LAB;  Service: Cardiovascular;  Laterality: N/A;  . CATARACT EXTRACTION    . CORONARY ARTERY BYPASS GRAFT  2000  . ESOPHAGOGASTRODUODENOSCOPY N/A 02/18/2017   Procedure: ESOPHAGOGASTRODUODENOSCOPY (EGD) with removal of food bolus;  Surgeon: [name], MD;  Location: [facility]  ENDOSCOPY;  Service: Endoscopy;  Laterality: N/A;  . ESOPHAGOGASTRODUODENOSCOPY (EGD) WITH PROPOFOL N/A 03/11/2017   Procedure: ESOPHAGOGASTRODUODENOSCOPY (EGD) WITH PROPOFOL;  Surgeon: [name], MD;  Location: [facility] ENDOSCOPY;  Service: Endoscopy;  Laterality: N/A;  . ESOPHAGOGASTRODUODENOSCOPY (EGD) WITH PROPOFOL N/A 04/15/2017   Procedure: ESOPHAGOGASTRODUODENOSCOPY (EGD) WITH PROPOFOL;  Surgeon: [name], MD;  Location: [facility] ENDOSCOPY;  Service: Endoscopy;  Laterality: N/A;  . EYE SURGERY Left    Cataract Extraction with IOL  . INGUINAL HERNIA REPAIR Left 06/24/2016   Procedure: HERNIA REPAIR INGUINAL ADULT;  Surgeon: [name], MD;  Location: [facility] ORS;  Service: General;  Laterality: Left;  . TONSILLECTOMY  1942   . UPPER GASTROINTESTINAL ENDOSCOPY  3 years ago    with Dilation.     Family History  Problem Relation Age of Onset  . Hypertension Mother   . Arthritis Father   . Other Other        no premature CAD.  [name] Colon cancer Neg Hx   . Prostate cancer Neg Hx   . Stomach cancer Neg Hx     SOCIAL HX: Non-smoker   Current Outpatient Medications:  .  aspirin 81 MG EC tablet, Take 2 tablets (162 mg total) by mouth daily., Disp: 30 tablet, Rfl: 0 .  atorvastatin (LIPITOR) 40 MG tablet, TAKE 1/2 TABLETS (20 MG TOTAL) BY MOUTH DAILY., Disp: 45 tablet, Rfl: 3 .  Calcium Carbonate Antacid (TUMS CHEWY BITES PO), Take 1 tablet by mouth as needed. , Disp: , Rfl:  .  CLINPRO 5000 1.1 % PSTE, APPLY PEASIZED AMOUNT, BRUSH DAILY AT LEAST 2 MINUTES. SPIT AFTER USE, Disp: , Rfl:  .  donepezil (ARICEPT) 10 MG tablet, Take 10 mg by mouth at bedtime., Disp: , Rfl:  .  isosorbide mononitrate (IMDUR) 30 MG 24 hr tablet, TAKE 1 TABLET BY MOUTH EVERY DAY, Disp: 90 tablet, Rfl: 3 .  Multiple Vitamin (MULTIVITAMIN) tablet, Take 1 tablet by mouth daily.  , Disp: , Rfl:  .  polyethylene glycol (MIRALAX / GLYCOLAX) packet, Take 17 g by mouth daily as needed. , Disp: , Rfl:  .  tamsulosin  (FLOMAX) 0.4 MG CAPS capsule, TAKE 2 CAPSULES (0.8 MG TOTAL) BY MOUTH DAILY., Disp: 60 capsule, Rfl: 3  EXAM: This was a telehealth telephone visit and thus no physical exam was completed.  ASSESSMENT AND PLAN:  Discussed the following assessment and plan:  Coronary artery disease Asymptomatic.  He will continue Imdur and Lipitor.  Dementia without behavioral disturbance (HCC) Stable on Aricept.  Encouraged continued mental stimulation and physical activity.  BPH (benign prostatic hyperplasia) We will increase his Flomax to 0.8 mg by mouth nightly.  I advised that they monitor for any lightheadedness with this and let us know if that occurs.  The let us know if his symptoms improve.  IBS (irritable bowel syndrome) Constipation type previously.  I suspect he may have gone the opposite direction with his use of MiraLAX.  He will discontinue the MiraLAX and monitor his symptoms.  If not improving over the next  3 or 4 days he will let us know.    I discussed the assessment and treatment plan with the patient. The patient was provided an opportunity to ask questions and all were answered. The patient agreed with the plan and demonstrated an understanding of the instructions.   The patient was advised to call back or seek an in-person evaluation if the symptoms worsen or if the condition fails to improve as anticipated.  I provided 28 minutes of non-face-to-face time during this encounter.   [name], MD

## 2019-08-02 NOTE — Assessment & Plan Note (Signed)
Constipation type previously.  I suspect he may have gone the opposite direction with his use of MiraLAX.  He will discontinue the MiraLAX and monitor his symptoms.  If not improving over the next 3 or 4 days he will let us know.

## 2019-08-02 NOTE — Assessment & Plan Note (Signed)
Asymptomatic.  He will continue Imdur and Lipitor.

## 2019-08-02 NOTE — Assessment & Plan Note (Signed)
We will increase his Flomax to 0.8 mg by mouth nightly.  I advised that they monitor for any lightheadedness with this and let us know if that occurs.  The let us know if his symptoms improve.

## 2019-08-02 NOTE — Assessment & Plan Note (Signed)
Stable on Aricept.  Encouraged continued mental stimulation and physical activity.

## 2019-08-03 ENCOUNTER — Telehealth: Payer: Self-pay | Admitting: Family Medicine

## 2019-08-03 NOTE — Telephone Encounter (Signed)
Pt double the dose of tamsulosin (FLOMAX) 0.4 MG CAPS capsule like told to at yesterdays appt and he is having diarrhea.  Please advise.  Frank Mcdaniel,cma

## 2019-08-03 NOTE — Telephone Encounter (Signed)
He should see how he does over the next day and if the diarrhea does not improve he needs to decrease back to the prior dose and see if it improves. If not improving he should let us know.

## 2019-08-03 NOTE — Telephone Encounter (Signed)
Pt double the dose of tamsulosin (FLOMAX) 0.4 MG CAPS capsule like told to at yesterdays appt and he is having diarrhea  Please contact pt son at [phone]

## 2019-08-04 NOTE — Telephone Encounter (Signed)
I called and spoke with the patient's sone and Informed him about the medication and he understood instruction.  Jillianna Stanek,cma

## 2019-08-06 NOTE — Progress Notes (Signed)
[place]  Telephone:([phone] [phone]  ID: Frank Mcdaniel OB: 27-Sep-1932  MR#: [id number]  HKV#:[id number]  Patient Care Team: [name], MD as PCP - General (Family Medicine) Rockey Situ Kathlene November, MD as Consulting Physician (Cardiology) [name], MD as Consulting Physician (Oncology) [name] as Physician Assistant (Urology)  CHIEF COMPLAINT: Hairy cell leukemia in complete remission.  INTERVAL HISTORY: Patient returns to clinic today for repeat laboratory work and routine yearly evaluation.  He continues to feel well and remains asymptomatic.  He does not complain of any weakness or fatigue today.  He has no neurologic complaints.  He denies any recent fevers or illnesses.  He denies any chest pain, shortness of breath, cough, or hemoptysis.  He denies nausea, vomiting, diarrhea or constipation.  He has no urinary complaints.  Patient feels at his baseline offers no specific complaints today.  REVIEW OF SYSTEMS:   Review of Systems  Constitutional: Negative.  Negative for fever, malaise/fatigue and weight loss.  Respiratory: Negative.  Negative for cough and shortness of breath.   Cardiovascular: Negative.  Negative for chest pain and leg swelling.  Gastrointestinal: Negative.  Negative for abdominal pain, heartburn, nausea and vomiting.  Genitourinary: Negative.  Negative for dysuria.  Musculoskeletal: Negative.  Negative for back pain.  Skin: Negative.  Negative for rash.  Neurological: Negative.  Negative for sensory change, focal weakness, weakness and headaches.  Psychiatric/Behavioral: Negative.  The patient is not nervous/anxious.     As per HPI. Otherwise, a complete review of systems is negative.  PAST MEDICAL HISTORY: Past Medical History:  Diagnosis Date  . Anemia 09/01/2015  . Anxiety 09/11/2015  . Arthritis   . Asthma    as a teenager  . Asymptomatic Sinus Bradycardia   . Atypical chest pain      a. 08/2014  . BPH (benign prostatic hyperplasia)   . CKD (chronic kidney disease), stage III   . Closed fracture of tuft of distal phalanx of finger 08/31/2018  . Coronary artery disease    a. 2000 s/p CABG;  b. 10/2008 Neg MV, EF 64%; c. cath 05/2015: LM 85% sev cal, pLAD-1 lesion 80%, pLAD-2 lesion 100% chronic, ostLCx 70% sev cal, OM4 90%, ostRCA 70%, mid RCA 90%, LIMA-LAD patent, VG-OM2 patent, VG-OM3 patent, VG-RPDA patent. No AS  . Dementia without behavioral disturbance ([place]) 05/28/2019  . GERD (gastroesophageal reflux disease)   . H/O echocardiogram    a. 10/2005 Echo: nl EF.  [name] Hairy cell leukemia ([place])   . HOH (hard of hearing)    Bilateral hearing aids  . HTN (hypertension)   . Hyperlipidemia   . Hypertension   . Leukemia ([place])   . Stricture and stenosis of esophagus     PAST SURGICAL HISTORY: Past Surgical History:  Procedure Laterality Date  . CARDIAC CATHETERIZATION N/A 05/26/2015   Procedure: Left Heart Cath;  Surgeon: [name], MD;  Location: [place] CV LAB;  Service: Cardiovascular;  Laterality: N/A;  . CARDIAC CATHETERIZATION N/A 05/26/2015   Procedure: Coronary/Graft Angiography;  Surgeon: [name], MD;  Location: [place] CV LAB;  Service: Cardiovascular;  Laterality: N/A;  . CATARACT EXTRACTION    . CORONARY ARTERY BYPASS GRAFT  2000  . ESOPHAGOGASTRODUODENOSCOPY N/A 02/18/2017   Procedure: ESOPHAGOGASTRODUODENOSCOPY (EGD) with removal of food bolus;  Surgeon: [name], MD;  Location: [facility] ENDOSCOPY;  Service: Endoscopy;  Laterality: N/A;  . ESOPHAGOGASTRODUODENOSCOPY (EGD) WITH PROPOFOL N/A 03/11/2017   Procedure: ESOPHAGOGASTRODUODENOSCOPY (  EGD) WITH PROPOFOL;  Surgeon: [name], MD;  Location: [facility] ENDOSCOPY;  Service: Endoscopy;  Laterality: N/A;  . ESOPHAGOGASTRODUODENOSCOPY (EGD) WITH PROPOFOL N/A 04/15/2017   Procedure: ESOPHAGOGASTRODUODENOSCOPY (EGD) WITH PROPOFOL;  Surgeon: [name], MD;  Location: [facility] ENDOSCOPY;   Service: Endoscopy;  Laterality: N/A;  . EYE SURGERY Left    Cataract Extraction with IOL  . INGUINAL HERNIA REPAIR Left 06/24/2016   Procedure: HERNIA REPAIR INGUINAL ADULT;  Surgeon: [name], MD;  Location: [facility] ORS;  Service: General;  Laterality: Left;  . TONSILLECTOMY  1942   . UPPER GASTROINTESTINAL ENDOSCOPY  3 years ago    with Dilation.     FAMILY HISTORY Family History  Problem Relation Age of Onset  . Hypertension Mother   . Arthritis Father   . Other Other        no premature CAD.  [name] Colon cancer Neg Hx   . Prostate cancer Neg Hx   . Stomach cancer Neg Hx        ADVANCED DIRECTIVES:    HEALTH MAINTENANCE: Social History   Tobacco Use  . Smoking status: Never Smoker  . Smokeless tobacco: Never Used  Substance Use Topics  . Alcohol use: No  . Drug use: No     No Known Allergies  Current Outpatient Medications  Medication Sig Dispense Refill  . aspirin 81 MG EC tablet Take 2 tablets (162 mg total) by mouth daily. 30 tablet 0  . atorvastatin (LIPITOR) 40 MG tablet TAKE 1/2 TABLETS (20 MG TOTAL) BY MOUTH DAILY. 45 tablet 3  . Calcium Carbonate Antacid (TUMS CHEWY BITES PO) Take 1 tablet by mouth as needed.     [name] CLINPRO 5000 1.1 % PSTE APPLY PEASIZED AMOUNT, BRUSH DAILY AT LEAST 2 MINUTES. SPIT AFTER USE    . donepezil (ARICEPT) 10 MG tablet Take 10 mg by mouth at bedtime.    . isosorbide mononitrate (IMDUR) 30 MG 24 hr tablet TAKE 1 TABLET BY MOUTH EVERY DAY 90 tablet 3  . Multiple Vitamin (MULTIVITAMIN) tablet Take 1 tablet by mouth daily.      . polyethylene glycol (MIRALAX / GLYCOLAX) packet Take 17 g by mouth daily as needed.     . tamsulosin (FLOMAX) 0.4 MG CAPS capsule TAKE 2 CAPSULES (0.8 MG TOTAL) BY MOUTH DAILY. 60 capsule 3   No current facility-administered medications for this visit.    OBJECTIVE: Vitals:   08/12/19 1401  BP: (!) 112/59  Pulse: (!) 58  Resp: 16  Temp: 98.3 F (36.8 C)  SpO2: 100%     Body mass index is  28.46 kg/m.    ECOG FS:0 - Asymptomatic  General: Thin, no acute distress. Eyes: Pink conjunctiva, anicteric sclera. HEENT: Normocephalic, moist mucous membranes. Lungs: No audible wheezing or coughing. Heart: Regular rate and rhythm. Abdomen: Soft, nontender, no obvious distention. Musculoskeletal: No edema, cyanosis, or clubbing. Neuro: Alert, answering all questions appropriately. Cranial nerves grossly intact. Skin: No rashes or petechiae noted. Psych: Normal affect.   LAB RESULTS:  Lab Results  Component Value Date   NA 140 05/28/2019   K 4.1 05/28/2019   CL 106 05/28/2019   CO2 27 05/28/2019   GLUCOSE 88 05/28/2019   BUN 16 05/28/2019   CREATININE 1.34 05/28/2019   CALCIUM 9.5 05/28/2019   PROT 6.5 05/28/2019   ALBUMIN 4.3 05/28/2019   AST 22 05/28/2019   ALT 12 05/28/2019   ALKPHOS 67 05/28/2019   BILITOT 0.5 05/28/2019   GFRNONAA 46 (L)  02/18/2017   GFRAA 53 (L) 02/18/2017    Lab Results  Component Value Date   WBC 3.2 (L) 08/12/2019   NEUTROABS 2.2 08/12/2019   HGB 11.0 (L) 08/12/2019   HCT 33.7 (L) 08/12/2019   MCV 98.8 08/12/2019   PLT 135 (L) 08/12/2019     STUDIES: No results found.  ASSESSMENT:  Hairy cell leukemia in complete remission.  PLAN:   1. Hairy cell leukemia in complete remission: Bone marrow biopsy in May 2017 revealed no evidence of disease. Patient initially received cladribine in 2006 and then was treated for a relapse in 2011 and again in 2014. Bone marrow biopsy at that time confirmed recurrence of disease with 80-90% hairy cell leukemia noted in his specimen. Patient completed cladribine 0.15 mg/kg on days 1 through 5 along with 8 weekly cycles of Rituxan on April 18, 2015. Bone marrow biopsy results after completion of 8 weeks of Rituxan without evidence of residual disease. No intervention is needed at this time. If there were suspicion of recurrence, patient will require a bone marrow biopsy to confirm.  Patient noted to  have a mild pancytopenia which is how he initially presented.  Return to clinic in 6 weeks for laboratory work and then in 3 months for laboratory work and further evaluation.   2. Thrombocytopenia: Platelets slightly decreased, repeat laboratory work as above.  Patient had normal trilineage hematopoiesis in his bone marrow.  3. Weakness and fatigue: Patient does not complain of this today. 4. GERD: Chronic and unchanged.  Continue over-the-counter Zantac and Tums as needed.   Patient expressed understanding and was in agreement with this plan. He also understands that He can call clinic at any time with any questions, concerns, or complaints.    [name], MD 08/12/19 5:23 PM   ROS

## 2019-08-12 ENCOUNTER — Other Ambulatory Visit: Payer: Self-pay

## 2019-08-12 ENCOUNTER — Encounter: Payer: Self-pay | Admitting: Oncology

## 2019-08-12 ENCOUNTER — Inpatient Hospital Stay (HOSPITAL_BASED_OUTPATIENT_CLINIC_OR_DEPARTMENT_OTHER): Payer: PPO | Admitting: Oncology

## 2019-08-12 ENCOUNTER — Inpatient Hospital Stay: Payer: PPO | Attending: Oncology

## 2019-08-12 VITALS — BP 112/59 | HR 58 | Temp 98.3°F | Resp 16 | Wt 165.8 lb

## 2019-08-12 DIAGNOSIS — C9140 Hairy cell leukemia not having achieved remission: Secondary | ICD-10-CM | POA: Insufficient documentation

## 2019-08-12 DIAGNOSIS — Z951 Presence of aortocoronary bypass graft: Secondary | ICD-10-CM | POA: Diagnosis not present

## 2019-08-12 DIAGNOSIS — E785 Hyperlipidemia, unspecified: Secondary | ICD-10-CM | POA: Diagnosis not present

## 2019-08-12 DIAGNOSIS — D696 Thrombocytopenia, unspecified: Secondary | ICD-10-CM | POA: Insufficient documentation

## 2019-08-12 DIAGNOSIS — Z7982 Long term (current) use of aspirin: Secondary | ICD-10-CM | POA: Diagnosis not present

## 2019-08-12 DIAGNOSIS — F0390 Unspecified dementia without behavioral disturbance: Secondary | ICD-10-CM | POA: Diagnosis not present

## 2019-08-12 DIAGNOSIS — I2510 Atherosclerotic heart disease of native coronary artery without angina pectoris: Secondary | ICD-10-CM | POA: Insufficient documentation

## 2019-08-12 DIAGNOSIS — I129 Hypertensive chronic kidney disease with stage 1 through stage 4 chronic kidney disease, or unspecified chronic kidney disease: Secondary | ICD-10-CM | POA: Insufficient documentation

## 2019-08-12 DIAGNOSIS — K219 Gastro-esophageal reflux disease without esophagitis: Secondary | ICD-10-CM | POA: Diagnosis not present

## 2019-08-12 DIAGNOSIS — N189 Chronic kidney disease, unspecified: Secondary | ICD-10-CM | POA: Insufficient documentation

## 2019-08-12 DIAGNOSIS — Z79899 Other long term (current) drug therapy: Secondary | ICD-10-CM | POA: Insufficient documentation

## 2019-08-12 DIAGNOSIS — M1990 Unspecified osteoarthritis, unspecified site: Secondary | ICD-10-CM | POA: Insufficient documentation

## 2019-08-12 DIAGNOSIS — C9141 Hairy cell leukemia, in remission: Secondary | ICD-10-CM

## 2019-08-12 LAB — CBC WITH DIFFERENTIAL/PLATELET
Abs Immature Granulocytes: 0.01 10*3/uL (ref 0.00–0.07)
Basophils Absolute: 0 10*3/uL (ref 0.0–0.1)
Basophils Relative: 0 %
Eosinophils Absolute: 0.1 10*3/uL (ref 0.0–0.5)
Eosinophils Relative: 4 %
HCT: 33.7 % — ABNORMAL LOW (ref 39.0–52.0)
Hemoglobin: 11 g/dL — ABNORMAL LOW (ref 13.0–17.0)
Immature Granulocytes: 0 %
Lymphocytes Relative: 22 %
Lymphs Abs: 0.7 10*3/uL (ref 0.7–4.0)
MCH: 32.3 pg (ref 26.0–34.0)
MCHC: 32.6 g/dL (ref 30.0–36.0)
MCV: 98.8 fL (ref 80.0–100.0)
Monocytes Absolute: 0.1 10*3/uL (ref 0.1–1.0)
Monocytes Relative: 4 %
Neutro Abs: 2.2 10*3/uL (ref 1.7–7.7)
Neutrophils Relative %: 70 %
Platelets: 135 10*3/uL — ABNORMAL LOW (ref 150–400)
RBC: 3.41 MIL/uL — ABNORMAL LOW (ref 4.22–5.81)
RDW: 13.3 % (ref 11.5–15.5)
WBC: 3.2 10*3/uL — ABNORMAL LOW (ref 4.0–10.5)
nRBC: 0 % (ref 0.0–0.2)

## 2019-08-30 ENCOUNTER — Encounter: Payer: Self-pay | Admitting: Family Medicine

## 2019-08-31 ENCOUNTER — Other Ambulatory Visit: Payer: Self-pay | Admitting: Cardiovascular Disease

## 2019-09-01 ENCOUNTER — Other Ambulatory Visit: Payer: Self-pay

## 2019-09-01 DIAGNOSIS — N400 Enlarged prostate without lower urinary tract symptoms: Secondary | ICD-10-CM

## 2019-09-01 MED ORDER — TAMSULOSIN HCL 0.4 MG PO CAPS: ORAL_CAPSULE | ORAL | 3 refills | Status: DC

## 2019-09-23 ENCOUNTER — Inpatient Hospital Stay: Payer: PPO | Attending: Oncology

## 2019-09-23 ENCOUNTER — Inpatient Hospital Stay: Payer: PPO

## 2019-09-23 ENCOUNTER — Other Ambulatory Visit: Payer: Self-pay

## 2019-09-23 DIAGNOSIS — N183 Chronic kidney disease, stage 3 unspecified: Secondary | ICD-10-CM | POA: Diagnosis not present

## 2019-09-23 DIAGNOSIS — Z951 Presence of aortocoronary bypass graft: Secondary | ICD-10-CM | POA: Diagnosis not present

## 2019-09-23 DIAGNOSIS — F0390 Unspecified dementia without behavioral disturbance: Secondary | ICD-10-CM | POA: Diagnosis not present

## 2019-09-23 DIAGNOSIS — C9140 Hairy cell leukemia not having achieved remission: Secondary | ICD-10-CM | POA: Insufficient documentation

## 2019-09-23 DIAGNOSIS — K219 Gastro-esophageal reflux disease without esophagitis: Secondary | ICD-10-CM | POA: Diagnosis not present

## 2019-09-23 DIAGNOSIS — C9141 Hairy cell leukemia, in remission: Secondary | ICD-10-CM

## 2019-09-23 DIAGNOSIS — E785 Hyperlipidemia, unspecified: Secondary | ICD-10-CM | POA: Insufficient documentation

## 2019-09-23 DIAGNOSIS — I2510 Atherosclerotic heart disease of native coronary artery without angina pectoris: Secondary | ICD-10-CM | POA: Insufficient documentation

## 2019-09-23 DIAGNOSIS — N400 Enlarged prostate without lower urinary tract symptoms: Secondary | ICD-10-CM | POA: Diagnosis not present

## 2019-09-23 DIAGNOSIS — D696 Thrombocytopenia, unspecified: Secondary | ICD-10-CM | POA: Diagnosis not present

## 2019-09-23 DIAGNOSIS — I129 Hypertensive chronic kidney disease with stage 1 through stage 4 chronic kidney disease, or unspecified chronic kidney disease: Secondary | ICD-10-CM | POA: Diagnosis not present

## 2019-09-23 DIAGNOSIS — M1990 Unspecified osteoarthritis, unspecified site: Secondary | ICD-10-CM | POA: Insufficient documentation

## 2019-09-23 DIAGNOSIS — Z7982 Long term (current) use of aspirin: Secondary | ICD-10-CM | POA: Diagnosis not present

## 2019-09-23 DIAGNOSIS — Z79899 Other long term (current) drug therapy: Secondary | ICD-10-CM | POA: Diagnosis not present

## 2019-09-23 LAB — CBC WITH DIFFERENTIAL/PLATELET
Abs Immature Granulocytes: 0 10*3/uL (ref 0.00–0.07)
Basophils Absolute: 0 10*3/uL (ref 0.0–0.1)
Basophils Relative: 1 %
Eosinophils Absolute: 0.1 10*3/uL (ref 0.0–0.5)
Eosinophils Relative: 3 %
HCT: 31.4 % — ABNORMAL LOW (ref 39.0–52.0)
Hemoglobin: 10.2 g/dL — ABNORMAL LOW (ref 13.0–17.0)
Immature Granulocytes: 0 %
Lymphocytes Relative: 29 %
Lymphs Abs: 0.6 10*3/uL — ABNORMAL LOW (ref 0.7–4.0)
MCH: 32.4 pg (ref 26.0–34.0)
MCHC: 32.5 g/dL (ref 30.0–36.0)
MCV: 99.7 fL (ref 80.0–100.0)
Monocytes Absolute: 0.1 10*3/uL (ref 0.1–1.0)
Monocytes Relative: 5 %
Neutro Abs: 1.3 10*3/uL — ABNORMAL LOW (ref 1.7–7.7)
Neutrophils Relative %: 62 %
Platelets: 86 10*3/uL — ABNORMAL LOW (ref 150–400)
RBC: 3.15 MIL/uL — ABNORMAL LOW (ref 4.22–5.81)
RDW: 14.4 % (ref 11.5–15.5)
WBC: 2 10*3/uL — ABNORMAL LOW (ref 4.0–10.5)
nRBC: 0 % (ref 0.0–0.2)

## 2019-09-27 ENCOUNTER — Inpatient Hospital Stay (HOSPITAL_BASED_OUTPATIENT_CLINIC_OR_DEPARTMENT_OTHER): Payer: PPO | Admitting: Oncology

## 2019-09-27 ENCOUNTER — Other Ambulatory Visit: Payer: Self-pay

## 2019-09-27 VITALS — BP 151/59 | HR 50 | Temp 97.5°F | Resp 18 | Wt 166.0 lb

## 2019-09-27 DIAGNOSIS — C9141 Hairy cell leukemia, in remission: Secondary | ICD-10-CM | POA: Diagnosis not present

## 2019-09-27 DIAGNOSIS — C9140 Hairy cell leukemia not having achieved remission: Secondary | ICD-10-CM | POA: Diagnosis not present

## 2019-09-28 ENCOUNTER — Other Ambulatory Visit: Payer: Self-pay | Admitting: Emergency Medicine

## 2019-09-28 DIAGNOSIS — C9141 Hairy cell leukemia, in remission: Secondary | ICD-10-CM

## 2019-09-28 NOTE — Progress Notes (Signed)
[place]  [phone] [phone]  ID: MELVERN RAMONE OB: 06-28-1933  MR#: [id number]  QPY#:[id number]  Patient Care Team: [name], MD as PCP - General (Family Medicine) Rockey Situ Kathlene November, MD as Consulting Physician (Cardiology) [name], MD as Consulting Physician (Oncology) [name] as Physician Assistant (Urology)  CHIEF COMPLAINT: Hairy cell leukemia in complete remission.  INTERVAL HISTORY: Patient returns to clinic today for repeat laboratory work, further evaluation, and discussion of possible bone marrow biopsy.  His blood counts continue to trend down, although patient remains asymptomatic. He does not complain of any weakness or fatigue today.  He has no neurologic complaints.  He denies any recent fevers or illnesses.  He denies any chest pain, shortness of breath, cough, or hemoptysis.  He denies nausea, vomiting, diarrhea or constipation.  He has no urinary complaints.  Patient offers no specific complaints today.  REVIEW OF SYSTEMS:   Review of Systems  Constitutional: Negative.  Negative for fever, malaise/fatigue and weight loss.  Respiratory: Negative.  Negative for cough and shortness of breath.   Cardiovascular: Negative.  Negative for chest pain and leg swelling.  Gastrointestinal: Negative.  Negative for abdominal pain, heartburn, nausea and vomiting.  Genitourinary: Negative.  Negative for dysuria.  Musculoskeletal: Negative.  Negative for back pain.  Skin: Negative.  Negative for rash.  Neurological: Negative.  Negative for sensory change, focal weakness, weakness and headaches.  Psychiatric/Behavioral: Negative.  The patient is not nervous/anxious.     As per HPI. Otherwise, a complete review of systems is negative.  PAST MEDICAL HISTORY: Past Medical History:  Diagnosis Date  . Anemia 09/01/2015  . Anxiety 09/11/2015  . Arthritis   . Asthma    as a teenager  . Asymptomatic  Sinus Bradycardia   . Atypical chest pain    a. 08/2014  . BPH (benign prostatic hyperplasia)   . CKD (chronic kidney disease), stage III   . Closed fracture of tuft of distal phalanx of finger 08/31/2018  . Coronary artery disease    a. 2000 s/p CABG;  b. 10/2008 Neg MV, EF 64%; c. cath 05/2015: LM 85% sev cal, pLAD-1 lesion 80%, pLAD-2 lesion 100% chronic, ostLCx 70% sev cal, OM4 90%, ostRCA 70%, mid RCA 90%, LIMA-LAD patent, VG-OM2 patent, VG-OM3 patent, VG-RPDA patent. No AS  . Dementia without behavioral disturbance ([place]) 05/28/2019  . GERD (gastroesophageal reflux disease)   . H/O echocardiogram    a. 10/2005 Echo: nl EF.  Frank Mcdaniel Hairy cell leukemia ([place])   . HOH (hard of hearing)    Bilateral hearing aids  . HTN (hypertension)   . Hyperlipidemia   . Hypertension   . Leukemia ([place])   . Stricture and stenosis of esophagus     PAST SURGICAL HISTORY: Past Surgical History:  Procedure Laterality Date  . CARDIAC CATHETERIZATION N/A 05/26/2015   Procedure: Left Heart Cath;  Surgeon: [name], MD;  Location: [facility];  Service: Cardiovascular;  Laterality: N/A;  . CARDIAC CATHETERIZATION N/A 05/26/2015   Procedure: Coronary/Graft Angiography;  Surgeon: [name], MD;  Location: [facility];  Service: Cardiovascular;  Laterality: N/A;  . CATARACT EXTRACTION    . CORONARY ARTERY BYPASS GRAFT  2000  . ESOPHAGOGASTRODUODENOSCOPY N/A 02/18/2017   Procedure: ESOPHAGOGASTRODUODENOSCOPY (EGD) with removal of food bolus;  Surgeon: [name], MD;  [facility] ENDOSCOPY;  Service: Endoscopy;  Laterality: N/A;  . ESOPHAGOGASTRODUODENOSCOPY (EGD) WITH PROPOFOL N/A 03/11/2017  Procedure: ESOPHAGOGASTRODUODENOSCOPY (EGD) WITH PROPOFOL;  Surgeon: [name], MD;  Location: [facility] ENDOSCOPY;  Service: Endoscopy;  Laterality: N/A;  . ESOPHAGOGASTRODUODENOSCOPY (EGD) WITH PROPOFOL N/A 04/15/2017   Procedure: ESOPHAGOGASTRODUODENOSCOPY (EGD) WITH PROPOFOL;  Surgeon: [name], MD;  Location: [facility] ENDOSCOPY;  Service: Endoscopy;  Laterality: N/A;  . EYE SURGERY Left    Cataract Extraction with IOL  . INGUINAL HERNIA REPAIR Left 06/24/2016   Procedure: HERNIA REPAIR INGUINAL ADULT;  Surgeon: [name], MD;  Location: [facility] ORS;  Service: General;  Laterality: Left;  . TONSILLECTOMY  1942   . UPPER GASTROINTESTINAL ENDOSCOPY  3 years ago    with Dilation.     FAMILY HISTORY Family History  Problem Relation Age of Onset  . Hypertension Mother   . Arthritis Father   . Other Other        no premature CAD.  Frank Mcdaniel Colon cancer Neg Hx   . Prostate cancer Neg Hx   . Stomach cancer Neg Hx        ADVANCED DIRECTIVES:    HEALTH MAINTENANCE: Social History   Tobacco Use  . Smoking status: Never Smoker  . Smokeless tobacco: Never Used  Substance Use Topics  . Alcohol use: No  . Drug use: No     No Known Allergies  Current Outpatient Medications  Medication Sig Dispense Refill  . aspirin 81 MG EC tablet Take 2 tablets (162 mg total) by mouth daily. 30 tablet 0  . atorvastatin (LIPITOR) 40 MG tablet TAKE 1/2 TABLETS (20 MG TOTAL) BY MOUTH DAILY. 45 tablet 3  . Calcium Carbonate Antacid (TUMS CHEWY BITES PO) Take 1 tablet by mouth as needed.     Frank Mcdaniel CLINPRO 5000 1.1 % PSTE APPLY PEASIZED AMOUNT, BRUSH DAILY AT LEAST 2 MINUTES. SPIT AFTER USE    . donepezil (ARICEPT) 10 MG tablet Take 10 mg by mouth at bedtime.    . isosorbide mononitrate (IMDUR) 30 MG 24 hr tablet TAKE 1 TABLET BY MOUTH EVERY DAY 90 tablet 3  . Multiple Vitamin (MULTIVITAMIN) tablet Take 1 tablet by mouth daily.      . polyethylene glycol (MIRALAX / GLYCOLAX) packet Take 17 g by mouth daily as needed.     . tamsulosin (FLOMAX) 0.4 MG CAPS capsule TAKE 1 CAPSULES (0.4 MG TOTAL) BY MOUTH DAILY. 30 capsule 3   No current facility-administered medications for this visit.    OBJECTIVE: Vitals:   09/27/19 1512  BP: (!) 151/59  Pulse: (!) 50  Resp: 18  Temp: (!) 97.5 F (36.4  C)  SpO2: 100%     Body mass index is 28.49 kg/m.    ECOG FS:0 - Asymptomatic  General: Thin, no acute distress. Eyes: Pink conjunctiva, anicteric sclera. HEENT: Normocephalic, moist mucous membranes. Lungs: No audible wheezing or coughing. Heart: Regular rate and rhythm. Abdomen: Soft, nontender, no obvious distention. Musculoskeletal: No edema, cyanosis, or clubbing. Neuro: Alert, answering all questions appropriately. Cranial nerves grossly intact. Skin: No rashes or petechiae noted. Psych: Normal affect.  LAB RESULTS:  Lab Results  Component Value Date   NA 140 05/28/2019   K 4.1 05/28/2019   CL 106 05/28/2019   CO2 27 05/28/2019   GLUCOSE 88 05/28/2019   BUN 16 05/28/2019   CREATININE 1.34 05/28/2019   CALCIUM 9.5 05/28/2019   PROT 6.5 05/28/2019   ALBUMIN 4.3 05/28/2019   AST 22 05/28/2019   ALT 12 05/28/2019   ALKPHOS 67 05/28/2019   BILITOT 0.5 05/28/2019   GFRNONAA  46 (L) 02/18/2017   GFRAA 53 (L) 02/18/2017    Lab Results  Component Value Date   WBC 2.0 (L) 09/23/2019   NEUTROABS 1.3 (L) 09/23/2019   HGB 10.2 (L) 09/23/2019   HCT 31.4 (L) 09/23/2019   MCV 99.7 09/23/2019   PLT 86 (L) 09/23/2019     STUDIES: No results found.  ASSESSMENT:  Hairy cell leukemia in complete remission.  PLAN:   1. Hairy cell leukemia in complete remission: Bone marrow biopsy in May 2017 revealed no evidence of disease. Patient initially received cladribine in 2006 and then was treated for a relapse in 2011 and again in 2014. Bone marrow biopsy at that time confirmed recurrence of disease with 80-90% hairy cell leukemia noted in his specimen. Patient completed cladribine 0.15 mg/kg on days 1 through 5 along with 8 weekly cycles of Rituxan on April 18, 2015. Bone marrow biopsy results after completion of 8 weeks of Rituxan without evidence of residual disease.  Patient typically has no evidence of disease and peripheral blood, but given his slowly declining blood counts  will repeat bone marrow biopsy in the next 1 to 2 weeks.  Patient will return to clinic 1 week after the biopsy to discuss the results and reinitiation of treatment if necessary.   2. Thrombocytopenia: Platelets are trending down.  Bone marrow biopsy as above.  Patient previously had normal trilineage hematopoiesis in his bone marrow.  3. Weakness and fatigue: Patient does not complain of this today. 4. GERD: Chronic and unchanged.  Continue over-the-counter Zantac and Tums as needed.  I spent a total of 30 minutes reviewing chart data, face-to-face evaluation with the patient, counseling and coordination of care as detailed above.   Patient expressed understanding and was in agreement with this plan. He also understands that He can call clinic at any time with any questions, concerns, or complaints.    [name], MD 09/28/19 1:08 PM   ROS

## 2019-10-04 ENCOUNTER — Other Ambulatory Visit: Payer: Self-pay

## 2019-10-04 ENCOUNTER — Encounter: Payer: Self-pay | Admitting: Cardiovascular Disease

## 2019-10-04 ENCOUNTER — Ambulatory Visit (INDEPENDENT_AMBULATORY_CARE_PROVIDER_SITE_OTHER): Payer: PPO | Admitting: Cardiovascular Disease

## 2019-10-04 VITALS — BP 116/56 | HR 53 | Ht 67.0 in | Wt 167.5 lb

## 2019-10-04 DIAGNOSIS — Z951 Presence of aortocoronary bypass graft: Secondary | ICD-10-CM | POA: Diagnosis not present

## 2019-10-04 DIAGNOSIS — I25118 Atherosclerotic heart disease of native coronary artery with other forms of angina pectoris: Secondary | ICD-10-CM

## 2019-10-04 DIAGNOSIS — I10 Essential (primary) hypertension: Secondary | ICD-10-CM | POA: Diagnosis not present

## 2019-10-04 DIAGNOSIS — R600 Localized edema: Secondary | ICD-10-CM | POA: Diagnosis not present

## 2019-10-04 DIAGNOSIS — E782 Mixed hyperlipidemia: Secondary | ICD-10-CM

## 2019-10-04 DIAGNOSIS — C9141 Hairy cell leukemia, in remission: Secondary | ICD-10-CM | POA: Diagnosis not present

## 2019-10-04 NOTE — Patient Instructions (Signed)

## 2019-10-04 NOTE — Progress Notes (Signed)
Cardiology Office Note  Date:  10/04/2019   ID:  Frank Mcdaniel, DOB 1933/06/01, MRN [id number]  PCP:  [name], MD   Chief Complaint  Patient presents with  . office visit    12 mo F/U; Meds verbally reviewed w/ pt.     HPI:  86 year old gentleman with a history of  coronary artery disease, bypass in July 2000, hairy cell leukemia,   chronic anemia,  history of hyperlipidemia.  who presents for routine followup of his coronary artery disease.   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  History of anemia,  periodic follow-up with Dr. [name] Needs bx, platelets and WBC going down Lab work reviewed in detail HCT 31.4, down from 39  No "problems" overall he reports  Echo 2017 - Left ventricle: The cavity size was normal. Systolic function was  normal. The estimated ejection fraction was in the range of 55%  to 60%. Wall motion was normal; there were no regional wall  motion abnormalities. Left ventricular diastolic function  parameters were normal.   EKG personally reviewed by myself on todays visit Shows sinus bradycardia rate 53 bpm right bundle branch block no significant ST or T wave changes No significant change from previous EKGs  Other past medical history Denies any shortness of breath admission to the hospital June 2016 for shortness of breath, had a echocardiogram, stress test Showing no ischemia Diagnosed with asthma exacerbation   slipping on the ice in January 2016, possibly landed on his left arm, had chest pressure, described as a dullness in his left pectoral area. He went to the emergency room and evaluation  showed negative cardiac enzymes, otherwise normal lab work including CBC, basic metabolic panel, EKG unchanged, chest x-ray normal. He was discharged home with follow-up today Possibly had one or 2 very short episodes of fleeting low-grade almost in the left chest. Again he wonders if it could be musculoskeletal from recent fall  Last stress test 01/2015 was a lexiscan, ejection fraction 65%, no ischemia noted, no EKG changes   PMH:   has a past medical history of Anemia (09/01/2015), Anxiety (09/11/2015), Arthritis, Asthma, Asymptomatic Sinus Bradycardia, Atypical chest pain, BPH (benign prostatic hyperplasia), CKD (chronic kidney disease), stage III, Closed fracture of tuft of distal phalanx of finger (08/31/2018), Coronary artery disease, Dementia without behavioral disturbance ([place]) (05/28/2019), GERD (gastroesophageal reflux disease), H/O echocardiogram, Hairy cell leukemia ([name]), HOH (hard of hearing), HTN (hypertension), Hyperlipidemia, Hypertension, Leukemia ([name]), and Stricture and stenosis of esophagus.  PSH:    Past Surgical History:  Procedure Laterality Date  . CARDIAC CATHETERIZATION N/A 05/26/2015   Procedure: Left Heart Cath;  Surgeon: [name], MD;  Location: [facility];  Service: Cardiovascular;  Laterality: N/A;  . CARDIAC CATHETERIZATION N/A 05/26/2015   Procedure: Coronary/Graft Angiography;  Surgeon: [name], MD;  Location: [facility];  Service: Cardiovascular;  Laterality: N/A;  . CATARACT EXTRACTION    . CORONARY ARTERY BYPASS GRAFT  2000  . ESOPHAGOGASTRODUODENOSCOPY N/A 02/18/2017   Procedure: ESOPHAGOGASTRODUODENOSCOPY (EGD) with removal of food bolus;  Surgeon: [name], MD;  Location[facility] ENDOSCOPY;  Service: Endoscopy;  Laterality: N/A;  .  ESOPHAGOGASTRODUODENOSCOPY (EGD) WITH PROPOFOL N/A 03/11/2017   Procedure: ESOPHAGOGASTRODUODENOSCOPY (EGD) WITH PROPOFOL;  Surgeon: [name], MD;  Location: [facility] ENDOSCOPY;  Service:  Endoscopy;  Laterality: N/A;  . ESOPHAGOGASTRODUODENOSCOPY (EGD) WITH PROPOFOL N/A 04/15/2017   Procedure: ESOPHAGOGASTRODUODENOSCOPY (EGD) WITH PROPOFOL;  Surgeon: [name], MD;  Location: [facility] ENDOSCOPY;  Service: Endoscopy;  Laterality: N/A;  . EYE SURGERY Left    Cataract Extraction with IOL  . INGUINAL HERNIA REPAIR Left 06/24/2016   Procedure: HERNIA REPAIR INGUINAL ADULT;  Surgeon: [name], MD;  Location: [facility] ORS;  Service: General;  Laterality: Left;  . TONSILLECTOMY  1942   . UPPER GASTROINTESTINAL ENDOSCOPY  3 years ago    with Dilation.     Current Outpatient Medications  Medication Sig Dispense Refill  . aspirin 81 MG EC tablet Take 2 tablets (162 mg total) by mouth daily. 30 tablet 0  . atorvastatin (LIPITOR) 40 MG tablet TAKE 1/2 TABLETS (20 MG TOTAL) BY MOUTH DAILY. 45 tablet 3  . Calcium Carbonate Antacid (TUMS CHEWY BITES PO) Take 1 tablet by mouth as needed.     [name] CLINPRO 5000 1.1 % PSTE APPLY PEASIZED AMOUNT, BRUSH DAILY AT LEAST 2 MINUTES. SPIT AFTER USE    . donepezil (ARICEPT) 10 MG tablet Take 10 mg by mouth at bedtime.    . isosorbide mononitrate (IMDUR) 30 MG 24 hr tablet TAKE 1 TABLET BY MOUTH EVERY DAY 90 tablet 3  . Multiple Vitamin (MULTIVITAMIN) tablet Take 1 tablet by mouth daily.      . polyethylene glycol (MIRALAX / GLYCOLAX) packet Take 17 g by mouth daily as needed.     . tamsulosin (FLOMAX) 0.4 MG CAPS capsule TAKE 1 CAPSULES (0.4 MG TOTAL) BY MOUTH DAILY. 30 capsule 3   No current facility-administered medications for this visit.     Allergies:   Patient has no known allergies.   Social History:  The patient  reports that he has never smoked. He has never used smokeless tobacco. He reports that he does not drink alcohol or use drugs.   Family History:   family history includes Arthritis in his father; Hypertension in his mother; Other in an other family member.    Review of Systems: Review of Systems  Constitutional: Negative.    HENT: Negative.   Respiratory: Negative.   Cardiovascular: Negative.   Gastrointestinal: Negative.   Musculoskeletal: Negative.   Neurological: Negative.   Psychiatric/Behavioral: Negative.   All other systems reviewed and are negative.   PHYSICAL EXAM: VS:  BP (!) 116/56 (BP Location: Left Arm, Patient Position: Sitting, Cuff Size: Normal)   Pulse (!) 53   Ht 5\' 7"  (1.702 m)   Wt 167 lb 8 oz (76 kg)   SpO2 98%   BMI 26.23 kg/m  , BMI Body mass index is 26.23 kg/m. Constitutional:  oriented to person, place, and time. No distress.  HENT:  Head: Grossly normal Eyes:  no discharge. No scleral icterus.  Neck: No JVD, no carotid bruits  Cardiovascular: Regular rate and rhythm, no murmurs appreciated Pulmonary/Chest: Clear to auscultation bilaterally, no wheezes or rails Abdominal: Soft.  no distension.  no tenderness.  Musculoskeletal: Normal range of motion Neurological:  normal muscle tone. Coordination normal. No atrophy Skin: Skin warm and dry Psychiatric: normal affect, pleasant  Recent Labs: 05/28/2019: ALT 12; BUN 16; Creatinine, Ser 1.34; Potassium 4.1; Sodium 140; TSH 0.65 09/23/2019: Hemoglobin 10.2; Platelets 86    Lipid Panel Lab Results  Component Value Date  CHOL 131 12/10/2018   HDL 53.90 12/10/2018   LDLCALC 65 12/10/2018   TRIG 59.0 12/10/2018     Wt Readings from Last 3 Encounters:  10/04/19 167 lb 8 oz (76 kg)  09/27/19 166 lb (75.3 kg)  08/12/19 165 lb 12.8 oz (75.2 kg)      ASSESSMENT AND PLAN:  Pure hypercholesterolemia Cholesterol is at goal on the current lipid regimen. No changes to the medications were made.  Essential hypertension Blood pressure is well controlled on today's visit. No changes made to the medications.  Coronary artery disease involving native coronary artery of native heart without angina pectoris -  Currently with no symptoms of angina. No further workup at this time. Continue current medication regimen.  Hairy  cell leukemia, in remission ([place]) Followed by Dr. [name], Pancytopenia, worse this past year  S/P CABG (coronary artery bypass graft) Currently with no symptoms of angina. No further workup at this time. Continue current medication regimen.    Total encounter time more than 25 minutes  Greater than 50% was spent in counseling and coordination of care with the patient   Disposition:   F/U  12 months   No orders of the defined types were placed in this encounter.    Signed, Esmond Plants, M.D., Ph.D. 10/04/2019  [place], [place]

## 2019-10-05 ENCOUNTER — Other Ambulatory Visit: Payer: Self-pay | Admitting: Radiology

## 2019-10-06 ENCOUNTER — Ambulatory Visit
Admission: RE | Admit: 2019-10-06 | Discharge: 2019-10-06 | Disposition: A | Discharge status: Home / Self Care | Payer: PPO | Source: Ambulatory Visit | Attending: Oncology | Admitting: Oncology

## 2019-10-06 ENCOUNTER — Other Ambulatory Visit: Payer: Self-pay

## 2019-10-06 DIAGNOSIS — Z79899 Other long term (current) drug therapy: Secondary | ICD-10-CM | POA: Diagnosis not present

## 2019-10-06 DIAGNOSIS — I129 Hypertensive chronic kidney disease with stage 1 through stage 4 chronic kidney disease, or unspecified chronic kidney disease: Secondary | ICD-10-CM | POA: Insufficient documentation

## 2019-10-06 DIAGNOSIS — Z8249 Family history of ischemic heart disease and other diseases of the circulatory system: Secondary | ICD-10-CM | POA: Insufficient documentation

## 2019-10-06 DIAGNOSIS — N400 Enlarged prostate without lower urinary tract symptoms: Secondary | ICD-10-CM | POA: Insufficient documentation

## 2019-10-06 DIAGNOSIS — D61818 Other pancytopenia: Secondary | ICD-10-CM | POA: Diagnosis not present

## 2019-10-06 DIAGNOSIS — M899 Disorder of bone, unspecified: Secondary | ICD-10-CM | POA: Diagnosis not present

## 2019-10-06 DIAGNOSIS — E785 Hyperlipidemia, unspecified: Secondary | ICD-10-CM | POA: Insufficient documentation

## 2019-10-06 DIAGNOSIS — N183 Chronic kidney disease, stage 3 unspecified: Secondary | ICD-10-CM | POA: Diagnosis not present

## 2019-10-06 DIAGNOSIS — C9141 Hairy cell leukemia, in remission: Secondary | ICD-10-CM

## 2019-10-06 DIAGNOSIS — I2510 Atherosclerotic heart disease of native coronary artery without angina pectoris: Secondary | ICD-10-CM | POA: Diagnosis not present

## 2019-10-06 DIAGNOSIS — C9140 Hairy cell leukemia not having achieved remission: Secondary | ICD-10-CM | POA: Diagnosis not present

## 2019-10-06 DIAGNOSIS — F0390 Unspecified dementia without behavioral disturbance: Secondary | ICD-10-CM | POA: Diagnosis not present

## 2019-10-06 DIAGNOSIS — Z951 Presence of aortocoronary bypass graft: Secondary | ICD-10-CM | POA: Insufficient documentation

## 2019-10-06 DIAGNOSIS — Z7982 Long term (current) use of aspirin: Secondary | ICD-10-CM | POA: Insufficient documentation

## 2019-10-06 DIAGNOSIS — Z8261 Family history of arthritis: Secondary | ICD-10-CM | POA: Insufficient documentation

## 2019-10-06 DIAGNOSIS — M1990 Unspecified osteoarthritis, unspecified site: Secondary | ICD-10-CM | POA: Insufficient documentation

## 2019-10-06 LAB — CBC WITH DIFFERENTIAL/PLATELET
Abs Immature Granulocytes: 0 10*3/uL (ref 0.00–0.07)
Basophils Absolute: 0 10*3/uL (ref 0.0–0.1)
Basophils Relative: 0 %
Eosinophils Absolute: 0.1 10*3/uL (ref 0.0–0.5)
Eosinophils Relative: 5 %
HCT: 32.8 % — ABNORMAL LOW (ref 39.0–52.0)
Hemoglobin: 11 g/dL — ABNORMAL LOW (ref 13.0–17.0)
Immature Granulocytes: 0 %
Lymphocytes Relative: 23 %
Lymphs Abs: 0.4 10*3/uL — ABNORMAL LOW (ref 0.7–4.0)
MCH: 32.8 pg (ref 26.0–34.0)
MCHC: 33.5 g/dL (ref 30.0–36.0)
MCV: 97.9 fL (ref 80.0–100.0)
Monocytes Absolute: 0.1 10*3/uL (ref 0.1–1.0)
Monocytes Relative: 4 %
Neutro Abs: 1.3 10*3/uL — ABNORMAL LOW (ref 1.7–7.7)
Neutrophils Relative %: 68 %
Platelets: 111 10*3/uL — ABNORMAL LOW (ref 150–400)
RBC: 3.35 MIL/uL — ABNORMAL LOW (ref 4.22–5.81)
RDW: 14.6 % (ref 11.5–15.5)
Smear Review: DECREASED
WBC: 1.9 10*3/uL — ABNORMAL LOW (ref 4.0–10.5)
nRBC: 0 % (ref 0.0–0.2)

## 2019-10-06 MED ORDER — FENTANYL CITRATE (PF) 100 MCG/2ML IJ SOLN
INTRAMUSCULAR | Status: AC | PRN
  Administered 2019-10-06: 50 ug via INTRAVENOUS

## 2019-10-06 MED ORDER — SODIUM CHLORIDE 0.9 % IV SOLN: INTRAVENOUS | Status: DC

## 2019-10-06 MED ORDER — FENTANYL CITRATE (PF) 100 MCG/2ML IJ SOLN
INTRAMUSCULAR | Status: AC
  Filled 2019-10-06: qty 2

## 2019-10-06 MED ORDER — HEPARIN SOD (PORK) LOCK FLUSH 100 UNIT/ML IV SOLN
INTRAVENOUS | Status: AC
  Filled 2019-10-06: qty 5

## 2019-10-06 MED ORDER — MIDAZOLAM HCL 2 MG/2ML IJ SOLN
INTRAMUSCULAR | Status: AC | PRN
  Administered 2019-10-06: 1 mg via INTRAVENOUS

## 2019-10-06 MED ORDER — MIDAZOLAM HCL 2 MG/2ML IJ SOLN
INTRAMUSCULAR | Status: AC
  Filled 2019-10-06: qty 2

## 2019-10-06 NOTE — Procedures (Signed)
Pre-procedure Diagnosis: Hairy cell leukemia Post-procedure Diagnosis: Same  Technically successful CT guided bone marrow aspiration and biopsy of left iliac crest.   Complications: None Immediate  EBL: None  Signed: Sandi Mariscal Pager: [phone] 10/06/2019, 9:14 AM

## 2019-10-06 NOTE — Discharge Instructions (Signed)
Bone Marrow Aspiration and Bone Marrow Biopsy, Adult, Care After This sheet gives you information about how to care for yourself after your procedure. Your health care provider may also give you more specific instructions. If you have problems or questions, contact your health care provider. What can I expect after the procedure? After the procedure, it is common to have:  Mild pain and tenderness.  Swelling.  Bruising. Follow these instructions at home: Puncture site care   Follow instructions from your health care provider about how to take care of the puncture site. Make sure you: ? Wash your hands with soap and water before and after you change your bandage (dressing). If soap and water are not available, use hand sanitizer. ? Change your dressing as told by your health care provider.  Check your puncture site every day for signs of infection. Check for: ? More redness, swelling, or pain. ? Fluid or blood. ? Warmth. ? Pus or a bad smell. Activity  Return to your normal activities as told by your health care provider. Ask your health care provider what activities are safe for you.  Do not lift anything that is heavier than 10 lb (4.5 kg), or the limit that you are told, until your health care provider says that it is safe.  Do not drive for 24 hours if you were given a sedative during your procedure. General instructions   Take over-the-counter and prescription medicines only as told by your health care provider.  Do not take baths, swim, or use a hot tub until your health care provider approves. Ask your health care provider if you may take showers. You may only be allowed to take sponge baths.  If directed, put ice on the affected area. To do this: ? Put ice in a plastic bag. ? Place a towel between your skin and the bag. ? Leave the ice on for 20 minutes, 2-3 times a day.  Keep all follow-up visits as told by your health care provider. This is important. Contact a  health care provider if:  Your pain is not controlled with medicine.  You have a fever.  You have more redness, swelling, or pain around the puncture site.  You have fluid or blood coming from the puncture site.  Your puncture site feels warm to the touch.  You have pus or a bad smell coming from the puncture site. Summary  After the procedure, it is common to have mild pain, tenderness, swelling, and bruising.  Follow instructions from your health care provider about how to take care of the puncture site and what activities are safe for you.  Take over-the-counter and prescription medicines only as told by your health care provider.  Contact a health care provider if you have any signs of infection, such as fluid or blood coming from the puncture site. This information is not intended to replace advice given to you by your health care provider. Make sure you discuss any questions you have with your health care provider. Document Revised: 12/08/2018 Document Reviewed: 12/08/2018 Elsevier Patient Education  2020 Else[employer] Inc. Moderate Conscious Sedation, Adult, Care After These instructions provide you with information about caring for yourself after your procedure. Your health care provider may also give you more specific instructions. Your treatment has been planned according to current medical practices, but problems sometimes occur. Call your health care provider if you have any problems or questions after your procedure. What can I expect after the procedure? After your procedure,   it is common:  To feel sleepy for several hours.  To feel clumsy and have poor balance for several hours.  To have poor judgment for several hours.  To vomit if you eat too soon. Follow these instructions at home: For at least 24 hours after the procedure:   Do not: ? Participate in activities where you could fall or become injured. ? Drive. ? Use heavy machinery. ? Drink alcohol. ? Take  sleeping pills or medicines that cause drowsiness. ? Make important decisions or sign legal documents. ? Take care of children on your own.  Rest. Eating and drinking  Follow the diet recommended by your health care provider.  If you vomit: ? Drink water, juice, or soup when you can drink without vomiting. ? Make sure you have little or no nausea before eating solid foods. General instructions  Have a responsible adult stay with you until you are awake and alert.  Take over-the-counter and prescription medicines only as told by your health care provider.  If you smoke, do not smoke without supervision.  Keep all follow-up visits as told by your health care provider. This is important. Contact a health care provider if:  You keep feeling nauseous or you keep vomiting.  You feel light-headed.  You develop a rash.  You have a fever. Get help right away if:  You have trouble breathing. This information is not intended to replace advice given to you by your health care provider. Make sure you discuss any questions you have with your health care provider. Document Revised: 07/04/2017 Document Reviewed: 11/11/2015 Elsevier Patient Education  2020 [employer] Inc.  

## 2019-10-06 NOTE — Consult Note (Signed)
Chief Complaint: Hairy cell leukemia  Referring Physician(s): Finnegan,Timothy J  Patient Status: [facility] - Out-pt  History of Present Illness: Frank Mcdaniel is a 86 y.o. male with past medical history significant for hairy cell leukemia, chronic kidney disease, BPH, CAD (post CABG), hypertension, hyperlipidemia and dementia who presents today for CT-guided bone marrow biopsy for tissue diagnostic purposes.  Patient is currently without complaint.  Specifically, no chest pain, shortness of breath, fever or chills.  Past Medical History:  Diagnosis Date  . Anemia 09/01/2015  . Anxiety 09/11/2015  . Arthritis   . Asthma    as a teenager  . Asymptomatic Sinus Bradycardia   . Atypical chest pain    a. 08/2014  . BPH (benign prostatic hyperplasia)   . CKD (chronic kidney disease), stage III   . Closed fracture of tuft of distal phalanx of finger 08/31/2018  . Coronary artery disease    a. 2000 s/p CABG;  b. 10/2008 Neg MV, EF 64%; c. cath 05/2015: LM 85% sev cal, pLAD-1 lesion 80%, pLAD-2 lesion 100% chronic, ostLCx 70% sev cal, OM4 90%, ostRCA 70%, mid RCA 90%, LIMA-LAD patent, VG-OM2 patent, VG-OM3 patent, VG-RPDA patent. No AS  . Dementia without behavioral disturbance ([place]) 05/28/2019  . GERD (gastroesophageal reflux disease)   . H/O echocardiogram    a. 10/2005 Echo: nl EF.  [name] Hairy cell leukemia ([place])   . HOH (hard of hearing)    Bilateral hearing aids  . HTN (hypertension)   . Hyperlipidemia   . Hypertension   . Leukemia ([place])   . Stricture and stenosis of esophagus     Past Surgical History:  Procedure Laterality Date  . CARDIAC CATHETERIZATION N/A 05/26/2015   Procedure: Left Heart Cath;  Surgeon: [name], MD;  Location: [place] [facility];  Service: Cardiovascular;  Laterality: N/A;  . CARDIAC CATHETERIZATION N/A 05/26/2015   Procedure: Coronary/Graft Angiography;  Surgeon: [name], MD;  Location: [place] [facility];  Service: Cardiovascular;   Laterality: N/A;  . CATARACT EXTRACTION    . CORONARY ARTERY BYPASS GRAFT  2000  . ESOPHAGOGASTRODUODENOSCOPY N/A 02/18/2017   Procedure: ESOPHAGOGASTRODUODENOSCOPY (EGD) with removal of food bolus;  Surgeon: [name], MD;  Location: [facility];  Service: Endoscopy;  Laterality: N/A;  . ESOPHAGOGASTRODUODENOSCOPY (EGD) WITH PROPOFOL N/A 03/11/2017   Procedure: ESOPHAGOGASTRODUODENOSCOPY (EGD) WITH PROPOFOL;  Surgeon: [name], MD;  Location: [facility] ENDOSCOPY;  Service: Endoscopy;  Laterality: N/A;  . ESOPHAGOGASTRODUODENOSCOPY (EGD) WITH PROPOFOL N/A 04/15/2017   Procedure: ESOPHAGOGASTRODUODENOSCOPY (EGD) WITH PROPOFOL;  Surgeon: [name], MD;  Location: [facility] ENDOSCOPY;  Service: Endoscopy;  Laterality: N/A;  . EYE SURGERY Left    Cataract Extraction with IOL  . INGUINAL HERNIA REPAIR Left 06/24/2016   Procedure: HERNIA REPAIR INGUINAL ADULT;  Surgeon: [name], MD;  Location: [facility] ORS;  Service: General;  Laterality: Left;  . TONSILLECTOMY  1942   . UPPER GASTROINTESTINAL ENDOSCOPY  3 years ago    with Dilation.     Allergies: Patient has no known allergies.  Medications: Prior to Admission medications   Medication Sig Start Date End Date Taking? Authorizing Provider  aspirin 81 MG EC tablet Take 2 tablets (162 mg total) by mouth daily. 11/08/10  Yes Gollan, Kathlene November, MD  atorvastatin (LIPITOR) 40 MG tablet TAKE 1/2 TABLETS (20 MG TOTAL) BY MOUTH DAILY. 11/03/18  Yes [name], MD  Calcium Carbonate Antacid (TUMS CHEWY BITES PO) Take 1 tablet by mouth as needed.  Yes [provider]  CLINPRO 5000 1.1 % PSTE APPLY PEASIZED AMOUNT, BRUSH DAILY AT LEAST 2 MINUTES. SPIT AFTER USE 04/11/19  Yes [provider]  donepezil (ARICEPT) 10 MG tablet Take 10 mg by mouth at bedtime.   Yes [provider]  isosorbide mononitrate (IMDUR) 30 MG 24 hr tablet TAKE 1 TABLET BY MOUTH EVERY DAY 08/31/19  Yes Gollan, Kathlene November, MD  Multiple Vitamin  (MULTIVITAMIN) tablet Take 1 tablet by mouth daily.     Yes [provider]  polyethylene glycol (MIRALAX / GLYCOLAX) packet Take 17 g by mouth daily as needed.    Yes [provider]  tamsulosin (FLOMAX) 0.4 MG CAPS capsule TAKE 1 CAPSULES (0.4 MG TOTAL) BY MOUTH DAILY. 09/01/19  Yes [name], MD     Family History  Problem Relation Age of Onset  . Hypertension Mother   . Arthritis Father   . Other Other        no premature CAD.  [name] Colon cancer Neg Hx   . Prostate cancer Neg Hx   . Stomach cancer Neg Hx     Social History   Socioeconomic History  . Marital status: Widowed    Spouse name: Not on file  . Number of children: 1  . Years of education: Not on file  . Highest education level: Not on file  Occupational History  . Occupation: retired  Tobacco Use  . Smoking status: Never Smoker  . Smokeless tobacco: Never Used  Substance and Sexual Activity  . Alcohol use: No  . Drug use: No  . Sexual activity: Never  Other Topics Concern  . Not on file  Social History Narrative   Lives locally with son.  Fairly active around the house though does not routinely exercise.  Retired from [employer].   Social Determinants of Health   Financial Resource Strain:   . Difficulty of Paying Living Expenses: Not on file  Food Insecurity:   . Worried About Charity fundraiser in the Last Year: Not on file  . Ran Out of Food in the Last Year: Not on file  Transportation Needs:   . Lack of Transportation (Medical): Not on file  . Lack of Transportation (Non-Medical): Not on file  Physical Activity:   . Days of Exercise per Week: Not on file  . Minutes of Exercise per Session: Not on file  Stress: No Stress Concern Present  . Feeling of Stress : Not at all  Social Connections:   . Frequency of Communication with Friends and Family: Not on file  . Frequency of Social Gatherings with Friends and Family: Not on file  . Attends Religious Services: Not on file  .  Active Member of Clubs or Organizations: Not on file  . Attends Archivist Meetings: Not on file  . Marital Status: Not on file    ECOG Status: 0 - Asymptomatic  Review of Systems: A 12 point ROS discussed and pertinent positives are indicated in the HPI above.  All other systems are negative.  Review of Systems  Vital Signs: BP (!) 135/52   Pulse (!) 53   Temp 97.6 F (36.4 C) (Oral)   Resp 15   Ht 5' 7"  (1.702 m)   Wt 74.8 kg   SpO2 100%   BMI 25.84 kg/m   Physical Exam  Imaging: No results found.  Labs:  CBC: Recent Labs    02/09/19 1359 08/12/19 1341 09/23/19 1247 10/06/19 0802  WBC 4.7 3.2* 2.0* 1.9*  HGB 12.0* 11.0* 10.2* 11.0*  HCT 36.8* 33.7* 31.4* 32.8*  PLT 147* 135* 86* 111*    COAGS: No results for input(s): INR, APTT in the last 8760 hours.  BMP: Recent Labs    12/10/18 0807 05/28/19 1104  NA 141 140  K 4.1 4.1  CL 107 106  CO2 27 27  GLUCOSE 94 88  BUN 17 16  CALCIUM 9.5 9.5  CREATININE 1.39 1.34    LIVER FUNCTION TESTS: Recent Labs    12/10/18 0807 05/28/19 1104  BILITOT 0.7 0.5  AST 23 22  ALT 11 12  ALKPHOS 67 67  PROT 6.6 6.5  ALBUMIN 4.2 4.3    TUMOR MARKERS: No results for input(s): AFPTM, CEA, CA199, CHROMGRNA in the last 8760 hours.  Assessment and Plan:  Frank Mcdaniel is a 86 y.o. male with past medical history significant for hairy cell leukemia, chronic kidney disease, BPH, CAD (post CABG), hypertension, hyperlipidemia and dementia who presents today for CT-guided bone marrow biopsy for tissue diagnostic purposes.    Patient is currently without complaint.    Risks and benefits of CT guided BM Bx was discussed with the patient and/or patient's family including, but not limited to bleeding, infection, damage to adjacent structures or low yield requiring additional tests.  All of the questions were answered and there is agreement to proceed.  Consent signed and in chart.  Thank you for this  interesting consult.  I greatly enjoyed meeting Frank Mcdaniel and look forward to participating in their care.  A copy of this report was sent to the requesting provider on this date.  Electronically Signed: Sandi Mariscal, MD 10/06/2019, 9:11 AM   I spent a total of 15 Minutes in face to face in clinical consultation, greater than 50% of which was counseling/coordinating care for CT-guided bone marrow biopsy

## 2019-10-07 ENCOUNTER — Telehealth: Payer: Self-pay | Admitting: *Deleted

## 2019-10-07 LAB — SURGICAL PATHOLOGY

## 2019-10-07 NOTE — Telephone Encounter (Signed)
As soon as I get results we will let them know.

## 2019-10-07 NOTE — Telephone Encounter (Signed)
Son called stating that patient had his biopsy done and that he would like to be called with appts and plans as well as having MyChart notification sent once results are in. He states he would need to discuss appointments with scheduler to work out appointments to fit his schedule

## 2019-10-13 ENCOUNTER — Encounter (HOSPITAL_COMMUNITY): Payer: Self-pay | Admitting: Oncology

## 2019-10-14 NOTE — Progress Notes (Signed)
[place]  [phone] [phone]  ID: Frank Mcdaniel OB: May 13, 1933  MR#: [id number]  OFB#:[id number]  Patient Care Team: [name], MD as PCP - General (Family Medicine) Rockey Situ Kathlene November, MD as Consulting Physician (Cardiology) [name], MD as Consulting Physician (Oncology) [name] as Physician Assistant (Urology)  CHIEF COMPLAINT: Recurrent hairy cell leukemia.  INTERVAL HISTORY: Patient returns to clinic today for further evaluation, discussion of his bone marrow biopsy results, and treatment planning.  He continues to feel well and remains asymptomatic.  He does not complain of any weakness or fatigue today.  He has no neurologic complaints.  He denies any recent fevers or illnesses.  He denies any chest pain, shortness of breath, cough, or hemoptysis.  He denies nausea, vomiting, diarrhea or constipation.  He has no urinary complaints.  Patient offers no specific complaints today.  REVIEW OF SYSTEMS:   Review of Systems  Constitutional: Negative.  Negative for fever, malaise/fatigue and weight loss.  Respiratory: Negative.  Negative for cough and shortness of breath.   Cardiovascular: Negative.  Negative for chest pain and leg swelling.  Gastrointestinal: Negative.  Negative for abdominal pain, heartburn, nausea and vomiting.  Genitourinary: Negative.  Negative for dysuria.  Musculoskeletal: Negative.  Negative for back pain.  Skin: Negative.  Negative for rash.  Neurological: Negative.  Negative for sensory change, focal weakness, weakness and headaches.  Psychiatric/Behavioral: Negative.  The patient is not nervous/anxious.     As per HPI. Otherwise, a complete review of systems is negative.  PAST MEDICAL HISTORY: Past Medical History:  Diagnosis Date  . Anemia 09/01/2015  . Anxiety 09/11/2015  . Arthritis   . Asthma    as a teenager  . Asymptomatic Sinus Bradycardia   . Atypical chest pain     a. 08/2014  . BPH (benign prostatic hyperplasia)   . CKD (chronic kidney disease), stage III   . Closed fracture of tuft of distal phalanx of finger 08/31/2018  . Coronary artery disease    a. 2000 s/p CABG;  b. 10/2008 Neg MV, EF 64%; c. cath 05/2015: LM 85% sev cal, pLAD-1 lesion 80%, pLAD-2 lesion 100% chronic, ostLCx 70% sev cal, OM4 90%, ostRCA 70%, mid RCA 90%, LIMA-LAD patent, VG-OM2 patent, VG-OM3 patent, VG-RPDA patent. No AS  . Dementia without behavioral disturbance ([place]) 05/28/2019  . GERD (gastroesophageal reflux disease)   . H/O echocardiogram    a. 10/2005 Echo: nl EF.  [name] Hairy cell leukemia ([place])   . HOH (hard of hearing)    Bilateral hearing aids  . HTN (hypertension)   . Hyperlipidemia   . Hypertension   . Leukemia ([place])   . Stricture and stenosis of esophagus     PAST SURGICAL HISTORY: Past Surgical History:  Procedure Laterality Date  . CARDIAC CATHETERIZATION N/A 05/26/2015   Procedure: Left Heart Cath;  Surgeon: [name], MD;  Location: [place] [facility];  Service: Cardiovascular;  Laterality: N/A;  . CARDIAC CATHETERIZATION N/A 05/26/2015   Procedure: Coronary/Graft Angiography;  Surgeon: [name], MD;  Location: [place] [facility];  Service: Cardiovascular;  Laterality: N/A;  . CATARACT EXTRACTION    . CORONARY ARTERY BYPASS GRAFT  2000  . ESOPHAGOGASTRODUODENOSCOPY N/A 02/18/2017   Procedure: ESOPHAGOGASTRODUODENOSCOPY (EGD) with removal of food bolus;  Surgeon: [name], MD;  Location: [facility] ENDOSCOPY;  Service: Endoscopy;  Laterality: N/A;  . ESOPHAGOGASTRODUODENOSCOPY (EGD) WITH PROPOFOL N/A 03/11/2017   Procedure: ESOPHAGOGASTRODUODENOSCOPY (EGD) WITH  PROPOFOL;  Surgeon: [name], MD;  Location: [facility] ENDOSCOPY;  Service: Endoscopy;  Laterality: N/A;  . ESOPHAGOGASTRODUODENOSCOPY (EGD) WITH PROPOFOL N/A 04/15/2017   Procedure: ESOPHAGOGASTRODUODENOSCOPY (EGD) WITH PROPOFOL;  Surgeon: [name], MD;  Location: [facility] ENDOSCOPY;   Service: Endoscopy;  Laterality: N/A;  . EYE SURGERY Left    Cataract Extraction with IOL  . INGUINAL HERNIA REPAIR Left 06/24/2016   Procedure: HERNIA REPAIR INGUINAL ADULT;  Surgeon: [name], MD;  Location: [facility] ORS;  Service: General;  Laterality: Left;  . TONSILLECTOMY  1942   . UPPER GASTROINTESTINAL ENDOSCOPY  3 years ago    with Dilation.     FAMILY HISTORY Family History  Problem Relation Age of Onset  . Hypertension Mother   . Arthritis Father   . Other Other        no premature CAD.  [name] Colon cancer Neg Hx   . Prostate cancer Neg Hx   . Stomach cancer Neg Hx        ADVANCED DIRECTIVES:    HEALTH MAINTENANCE: Social History   Tobacco Use  . Smoking status: Never Smoker  . Smokeless tobacco: Never Used  Substance Use Topics  . Alcohol use: No  . Drug use: No     No Known Allergies  Current Outpatient Medications  Medication Sig Dispense Refill  . aspirin 81 MG EC tablet Take 2 tablets (162 mg total) by mouth daily. 30 tablet 0  . atorvastatin (LIPITOR) 40 MG tablet TAKE 1/2 TABLETS (20 MG TOTAL) BY MOUTH DAILY. 45 tablet 3  . Calcium Carbonate Antacid (TUMS CHEWY BITES PO) Take 1 tablet by mouth as needed.     [name] CLINPRO 5000 1.1 % PSTE APPLY PEASIZED AMOUNT, BRUSH DAILY AT LEAST 2 MINUTES. SPIT AFTER USE    . donepezil (ARICEPT) 10 MG tablet Take 10 mg by mouth at bedtime.    . isosorbide mononitrate (IMDUR) 30 MG 24 hr tablet TAKE 1 TABLET BY MOUTH EVERY DAY 90 tablet 3  . Multiple Vitamin (MULTIVITAMIN) tablet Take 1 tablet by mouth daily.      . polyethylene glycol (MIRALAX / GLYCOLAX) packet Take 17 g by mouth daily as needed.     . tamsulosin (FLOMAX) 0.4 MG CAPS capsule TAKE 1 CAPSULES (0.4 MG TOTAL) BY MOUTH DAILY. 30 capsule 3   No current facility-administered medications for this visit.    OBJECTIVE: Vitals:   10/18/19 1443  BP: (!) 111/56  Pulse: (!) 53  Temp: (!) 96.8 F (36 C)     Body mass index is 26.28 kg/m.    ECOG FS:0  - Asymptomatic  General: Thin, no acute distress. Eyes: Pink conjunctiva, anicteric sclera. HEENT: Normocephalic, moist mucous membranes. Lungs: No audible wheezing or coughing. Heart: Regular rate and rhythm. Abdomen: Soft, nontender, no obvious distention. Musculoskeletal: No edema, cyanosis, or clubbing. Neuro: Alert, answering all questions appropriately. Cranial nerves grossly intact. Skin: No rashes or petechiae noted. Psych: Normal affect.   LAB RESULTS:  Lab Results  Component Value Date   NA 140 05/28/2019   K 4.1 05/28/2019   CL 106 05/28/2019   CO2 27 05/28/2019   GLUCOSE 88 05/28/2019   BUN 16 05/28/2019   CREATININE 1.34 05/28/2019   CALCIUM 9.5 05/28/2019   PROT 6.5 05/28/2019   ALBUMIN 4.3 05/28/2019   AST 22 05/28/2019   ALT 12 05/28/2019   ALKPHOS 67 05/28/2019   BILITOT 0.5 05/28/2019   GFRNONAA 46 (L) 02/18/2017   GFRAA 53 (L) 02/18/2017  Lab Results  Component Value Date   WBC 1.9 (L) 10/06/2019   NEUTROABS 1.3 (L) 10/06/2019   HGB 11.0 (L) 10/06/2019   HCT 32.8 (L) 10/06/2019   MCV 97.9 10/06/2019   PLT 111 (L) 10/06/2019     STUDIES: CT BONE MARROW BIOPSY & ASPIRATION  Result Date: 10/06/2019 INDICATION: History of hairy cell leukemia. Please perform CT-guided bone marrow biopsy for tissue diagnostic purposes. EXAM: CT-GUIDED BONE MARROW BIOPSY AND ASPIRATION MEDICATIONS: None ANESTHESIA/SEDATION: Fentanyl 50 mcg IV; Versed 1 mg IV Sedation Time: 16 Minutes; The patient was continuously monitored during the procedure by the interventional radiology nurse under my direct supervision. COMPLICATIONS: None immediate. PROCEDURE: Informed consent was obtained from the patient following an explanation of the procedure, risks, benefits and alternatives. The patient understands, agrees and consents for the procedure. All questions were addressed. A time out was performed prior to the initiation of the procedure. The patient was positioned prone and  non-contrast localization CT was performed of the pelvis to demonstrate the iliac marrow spaces. The operative site was prepped and draped in the usual sterile fashion. Under sterile conditions and local anesthesia, a 22 gauge spinal needle was utilized for procedural planning. Next, an 11 gauge coaxial bone biopsy needle was advanced into the left iliac marrow space. Needle position was confirmed with CT imaging. Initially, a bone marrow aspiration was performed. Next, a bone marrow biopsy was obtained with the 11 gauge outer bone marrow device. Samples were prepared with the cytotechnologist and deemed adequate. The needle was removed and superficial hemostasis was obtained with manual compression. A dressing was applied. The patient tolerated the procedure well without immediate post procedural complication. IMPRESSION: Successful CT guided left iliac bone marrow aspiration and core biopsy. Electronically Signed   By: Sandi Mariscal M.D.   On: 10/06/2019 09:44   ONCOLOGY HISTORY:  Patient initially received cladribine in 2006 and then was treated for a relapse in 2011 and again in 2014. Bone marrow biopsy at that time confirmed recurrence of disease with 80-90% hairy cell leukemia noted in his specimen. Patient completed cladribine 0.15 mg/kg on days 1 through 5 along with 8 weekly cycles of Rituxan on April 18, 2015. Bone marrow biopsy results after completion of 8 weeks of Rituxan without evidence of residual disease.  Patient typically has no evidence of disease and peripheral blood.   ASSESSMENT: Recurrent hairy cell leukemia.  PLAN:   1.  Recurrent hairy cell leukemia: See oncology history as above.  Bone marrow biopsy on November 02, 2019 revealed recurrent hairy cell leukemia, but percentage of involvement was not reported.  After lengthy discussion with the patient and his son, he has agreed to repeat treatment he received in 2016 which included cladribine 0.15 mg/kg on days 1 through 5 along with 8  weekly cycles of Rituxan.  Return to clinic on November 08, 2019 for repeat laboratory work further evaluation and initiation of treatment. 2. Thrombocytopenia: Secondary to recurrent disease.  Treatment as above. 3. Weakness and fatigue: Patient does not complain of this today. 4. GERD: Chronic and unchanged.  Continue over-the-counter Zantac and Tums as needed.  I spent a total of 30 minutes reviewing chart data, face-to-face evaluation with the patient, counseling and coordination of care as detailed above.   Patient expressed understanding and was in agreement with this plan. He also understands that He can call clinic at any time with any questions, concerns, or complaints.    [name], MD 10/18/19 4:21 PM

## 2019-10-18 ENCOUNTER — Other Ambulatory Visit: Payer: Self-pay

## 2019-10-18 ENCOUNTER — Inpatient Hospital Stay: Payer: PPO | Attending: Oncology | Admitting: Oncology

## 2019-10-18 ENCOUNTER — Encounter: Payer: Self-pay | Admitting: Oncology

## 2019-10-18 VITALS — BP 111/56 | HR 53 | Temp 96.8°F | Wt 167.8 lb

## 2019-10-18 DIAGNOSIS — F0390 Unspecified dementia without behavioral disturbance: Secondary | ICD-10-CM | POA: Diagnosis not present

## 2019-10-18 DIAGNOSIS — I129 Hypertensive chronic kidney disease with stage 1 through stage 4 chronic kidney disease, or unspecified chronic kidney disease: Secondary | ICD-10-CM | POA: Insufficient documentation

## 2019-10-18 DIAGNOSIS — M1990 Unspecified osteoarthritis, unspecified site: Secondary | ICD-10-CM | POA: Insufficient documentation

## 2019-10-18 DIAGNOSIS — D6959 Other secondary thrombocytopenia: Secondary | ICD-10-CM | POA: Insufficient documentation

## 2019-10-18 DIAGNOSIS — C9142 Hairy cell leukemia, in relapse: Secondary | ICD-10-CM | POA: Diagnosis not present

## 2019-10-18 DIAGNOSIS — C9140 Hairy cell leukemia not having achieved remission: Secondary | ICD-10-CM | POA: Diagnosis not present

## 2019-10-18 DIAGNOSIS — Z7189 Other specified counseling: Secondary | ICD-10-CM | POA: Diagnosis not present

## 2019-10-18 DIAGNOSIS — K219 Gastro-esophageal reflux disease without esophagitis: Secondary | ICD-10-CM | POA: Insufficient documentation

## 2019-10-18 DIAGNOSIS — N400 Enlarged prostate without lower urinary tract symptoms: Secondary | ICD-10-CM | POA: Diagnosis not present

## 2019-10-18 DIAGNOSIS — E785 Hyperlipidemia, unspecified: Secondary | ICD-10-CM | POA: Insufficient documentation

## 2019-10-18 DIAGNOSIS — Z951 Presence of aortocoronary bypass graft: Secondary | ICD-10-CM | POA: Insufficient documentation

## 2019-10-18 DIAGNOSIS — Z7982 Long term (current) use of aspirin: Secondary | ICD-10-CM | POA: Diagnosis not present

## 2019-10-18 DIAGNOSIS — R531 Weakness: Secondary | ICD-10-CM | POA: Insufficient documentation

## 2019-10-18 DIAGNOSIS — N183 Chronic kidney disease, stage 3 unspecified: Secondary | ICD-10-CM | POA: Insufficient documentation

## 2019-10-18 DIAGNOSIS — Z79899 Other long term (current) drug therapy: Secondary | ICD-10-CM | POA: Diagnosis not present

## 2019-10-18 DIAGNOSIS — I2510 Atherosclerotic heart disease of native coronary artery without angina pectoris: Secondary | ICD-10-CM | POA: Diagnosis not present

## 2019-10-18 MED ORDER — PROCHLORPERAZINE MALEATE 10 MG PO TABS: 10.0000 mg | ORAL_TABLET | Freq: Four times a day (QID) | ORAL | 2 refills | Status: DC | PRN

## 2019-10-18 MED ORDER — ONDANSETRON HCL 8 MG PO TABS: 8.0000 mg | ORAL_TABLET | Freq: Two times a day (BID) | ORAL | 1 refills | Status: DC | PRN

## 2019-10-18 NOTE — Progress Notes (Signed)
START OFF PATHWAY REGIMEN - Other   OFF11564:Cladribine 0.14 mg/kg Subcut D1,2,3,4,5:   One cycle:     Cladribine   **Always confirm dose/schedule in your pharmacy ordering system**  OFF11695:Rituximab (IV/Subcut) D1 Weekly:   A cycle is every 7 days:     Rituximab-xxxx      Rituximab and hyaluronidase human   **Always confirm dose/schedule in your pharmacy ordering system**  Patient Characteristics: Intent of Therapy: Non-Curative / Palliative Intent, Discussed with Patient

## 2019-10-25 ENCOUNTER — Other Ambulatory Visit: Payer: Self-pay | Admitting: Oncology

## 2019-10-26 ENCOUNTER — Other Ambulatory Visit: Payer: Self-pay | Admitting: Family Medicine

## 2019-11-02 NOTE — Progress Notes (Signed)
Pharmacist Chemotherapy Monitoring - Initial Assessment    Anticipated start date: 11/08/19   Regimen:  . Are orders appropriate based on the patient's diagnosis, regimen, and cycle? Yes . Does the plan date match the patient's scheduled date? Yes . Is the sequencing of drugs appropriate? Yes . Are the premedications appropriate for the patient's regimen? Yes . Prior Authorization for treatment is: Approved o If applicable, is the correct biosimilar selected based on the patient's insurance? not applicable  Organ Function and Labs: [name] Are dose adjustments needed based on the patient's renal function, hepatic function, or hematologic function? Yes . Are appropriate labs ordered prior to the start of patient's treatment? Yes . Other organ system assessment, if indicated: N/A . The following baseline labs, if indicated, have been ordered: N/A  Dose Assessment: . Are the drug doses appropriate? Yes . Are the following correct: o Drug concentrations Yes o IV fluid compatible with drug Yes o Administration routes Yes o Timing of therapy Yes . If applicable, does the patient have documented access for treatment and/or plans for port-a-cath placement? not applicable . If applicable, have lifetime cumulative doses been properly documented and assessed? not applicable Lifetime Dose Tracking  No doses have been documented on this patient for the following tracked chemicals: Doxorubicin, Epirubicin, Idarubicin, Daunorubicin, Mitoxantrone, Bleomycin, Oxaliplatin, Carboplatin, Liposomal Doxorubicin  o   Toxicity Monitoring/Prevention: . The patient has the following take home antiemetics prescribed: Ondansetron and Prochlorperazine . The patient has the following take home medications prescribed: N/A . Medication allergies and previous infusion related reactions, if applicable, have been reviewed and addressed. Yes . The patient's current medication list has been assessed for drug-drug interactions  with their chemotherapy regimen. no significant drug-drug interactions were identified on review.  Order Review: . Are the treatment plan orders signed? Yes . Is the patient scheduled to see a provider prior to their treatment? Yes  I verify that I have reviewed each item in the above checklist and answered each question accordingly.  Davielle Lingelbach K 11/02/2019 8:55 AM

## 2019-11-04 NOTE — Progress Notes (Signed)
[place]  [phone] [phone]  ID: Frank Mcdaniel OB: May 31, 1933  MR#: [id number]  OBS#:[id number]  Patient Care Team: [name], MD as PCP - General (Family Medicine) Rockey Situ Kathlene November, MD as Consulting Physician (Cardiology) [name], MD as Consulting Physician (Oncology) [name] as Physician Assistant (Urology)  CHIEF COMPLAINT: Recurrent hairy cell leukemia.  INTERVAL HISTORY: Patient returns to clinic today for further evaluation and initiation of Rituxan cladribine.  He currently feels well and is asymptomatic. He does not complain of any weakness or fatigue today.  He has no neurologic complaints.  He denies any recent fevers or illnesses.  He denies any chest pain, shortness of breath, cough, or hemoptysis.  He denies nausea, vomiting, diarrhea or constipation.  He has no urinary complaints.  Patient offers no specific complaints today.  REVIEW OF SYSTEMS:   Review of Systems  Constitutional: Negative.  Negative for fever, malaise/fatigue and weight loss.  Respiratory: Negative.  Negative for cough, hemoptysis and shortness of breath.   Cardiovascular: Negative.  Negative for chest pain and leg swelling.  Gastrointestinal: Negative.  Negative for abdominal pain.  Genitourinary: Negative.  Negative for dysuria.  Musculoskeletal: Negative.   Skin: Negative.  Negative for rash.  Neurological: Negative.  Negative for dizziness, focal weakness, weakness and headaches.  Psychiatric/Behavioral: The patient is not nervous/anxious.     As per HPI. Otherwise, a complete review of systems is negative.  PAST MEDICAL HISTORY: Past Medical History:  Diagnosis Date  . Anemia 09/01/2015  . Anxiety 09/11/2015  . Arthritis   . Asthma    as a teenager  . Asymptomatic Sinus Bradycardia   . Atypical chest pain    a. 08/2014  . BPH (benign prostatic hyperplasia)   . CKD (chronic kidney disease), stage III     . Closed fracture of tuft of distal phalanx of finger 08/31/2018  . Coronary artery disease    a. 2000 s/p CABG;  b. 10/2008 Neg MV, EF 64%; c. cath 05/2015: LM 85% sev cal, pLAD-1 lesion 80%, pLAD-2 lesion 100% chronic, ostLCx 70% sev cal, OM4 90%, ostRCA 70%, mid RCA 90%, LIMA-LAD patent, VG-OM2 patent, VG-OM3 patent, VG-RPDA patent. No AS  . Dementia without behavioral disturbance ([place]) 05/28/2019  . GERD (gastroesophageal reflux disease)   . H/O echocardiogram    a. 10/2005 Echo: nl EF.  [name] Hairy cell leukemia ([place])   . HOH (hard of hearing)    Bilateral hearing aids  . HTN (hypertension)   . Hyperlipidemia   . Hypertension   . Leukemia ([place])   . Stricture and stenosis of esophagus     PAST SURGICAL HISTORY: Past Surgical History:  Procedure Laterality Date  . CARDIAC CATHETERIZATION N/A 05/26/2015   Procedure: Left Heart Cath;  Surgeon: [name], MD;  Location: [place] [facility];  Service: Cardiovascular;  Laterality: N/A;  . CARDIAC CATHETERIZATION N/A 05/26/2015   Procedure: Coronary/Graft Angiography;  Surgeon: [name], MD;  Location: [place] [facility];  Service: Cardiovascular;  Laterality: N/A;  . CATARACT EXTRACTION    . CORONARY ARTERY BYPASS GRAFT  2000  . ESOPHAGOGASTRODUODENOSCOPY N/A 02/18/2017   Procedure: ESOPHAGOGASTRODUODENOSCOPY (EGD) with removal of food bolus;  Surgeon: [name], MD;  Location: [facility] ENDOSCOPY;  Service: Endoscopy;  Laterality: N/A;  . ESOPHAGOGASTRODUODENOSCOPY (EGD) WITH PROPOFOL N/A 03/11/2017   Procedure: ESOPHAGOGASTRODUODENOSCOPY (EGD) WITH PROPOFOL;  Surgeon: [name], MD;  Location: [facility] ENDOSCOPY;  Service: Endoscopy;  Laterality: N/A;  .  ESOPHAGOGASTRODUODENOSCOPY (EGD) WITH PROPOFOL N/A 04/15/2017   Procedure: ESOPHAGOGASTRODUODENOSCOPY (EGD) WITH PROPOFOL;  Surgeon: [name], MD;  Location: [facility] ENDOSCOPY;  Service: Endoscopy;  Laterality: N/A;  . EYE SURGERY Left    Cataract Extraction with IOL  .  INGUINAL HERNIA REPAIR Left 06/24/2016   Procedure: HERNIA REPAIR INGUINAL ADULT;  Surgeon: [name], MD;  Location: [facility] ORS;  Service: General;  Laterality: Left;  . TONSILLECTOMY  1942   . UPPER GASTROINTESTINAL ENDOSCOPY  3 years ago    with Dilation.     FAMILY HISTORY Family History  Problem Relation Age of Onset  . Hypertension Mother   . Arthritis Father   . Other Other        no premature CAD.  [name] Colon cancer Neg Hx   . Prostate cancer Neg Hx   . Stomach cancer Neg Hx        ADVANCED DIRECTIVES:    HEALTH MAINTENANCE: Social History   Tobacco Use  . Smoking status: Never Smoker  . Smokeless tobacco: Never Used  Substance Use Topics  . Alcohol use: No  . Drug use: No     No Known Allergies  Current Outpatient Medications  Medication Sig Dispense Refill  . aspirin 81 MG EC tablet Take 2 tablets (162 mg total) by mouth daily. 30 tablet 0  . atorvastatin (LIPITOR) 40 MG tablet TAKE 1/2 TABLETS (20 MG TOTAL) BY MOUTH DAILY. 45 tablet 3  . Calcium Carbonate Antacid (TUMS CHEWY BITES PO) Take 1 tablet by mouth as needed.     [name] CLINPRO 5000 1.1 % PSTE APPLY PEASIZED AMOUNT, BRUSH DAILY AT LEAST 2 MINUTES. SPIT AFTER USE    . donepezil (ARICEPT) 10 MG tablet Take 10 mg by mouth at bedtime.    . isosorbide mononitrate (IMDUR) 30 MG 24 hr tablet TAKE 1 TABLET BY MOUTH EVERY DAY 90 tablet 3  . Multiple Vitamin (MULTIVITAMIN) tablet Take 1 tablet by mouth daily.      . ondansetron (ZOFRAN) 8 MG tablet Take 1 tablet (8 mg total) by mouth 2 (two) times daily as needed (Nausea or vomiting). 30 tablet 1  . polyethylene glycol (MIRALAX / GLYCOLAX) packet Take 17 g by mouth daily as needed.     . prochlorperazine (COMPAZINE) 10 MG tablet Take 1 tablet (10 mg total) by mouth every 6 (six) hours as needed (Nausea or vomiting). 60 tablet 2  . tamsulosin (FLOMAX) 0.4 MG CAPS capsule TAKE 1 CAPSULES (0.4 MG TOTAL) BY MOUTH DAILY. 30 capsule 3   No current  facility-administered medications for this visit.    OBJECTIVE: There were no vitals filed for this visit.   There is no height or weight on file to calculate BMI.    ECOG FS:0 - Asymptomatic  General: Thin, no acute distress. Eyes: Pink conjunctiva, anicteric sclera. HEENT: Normocephalic, moist mucous membranes. Lungs: No audible wheezing or coughing. Heart: Regular rate and rhythm. Abdomen: Soft, nontender, no obvious distention. Musculoskeletal: No edema, cyanosis, or clubbing. Neuro: Alert, answering all questions appropriately. Cranial nerves grossly intact. Skin: No rashes or petechiae noted. Psych: Normal affect.  LAB RESULTS:  Lab Results  Component Value Date   NA 142 11/08/2019   K 3.8 11/08/2019   CL 110 11/08/2019   CO2 25 11/08/2019   GLUCOSE 113 (H) 11/08/2019   BUN 18 11/08/2019   CREATININE 1.60 (H) 11/08/2019   CALCIUM 9.1 11/08/2019   PROT 6.5 05/28/2019   ALBUMIN 4.3 05/28/2019   AST 22  05/28/2019   ALT 12 05/28/2019   ALKPHOS 67 05/28/2019   BILITOT 0.5 05/28/2019   GFRNONAA 39 (L) 11/08/2019   GFRAA 45 (L) 11/08/2019    Lab Results  Component Value Date   WBC 1.5 (L) 11/08/2019   NEUTROABS 1.0 (L) 11/08/2019   HGB 10.4 (L) 11/08/2019   HCT 30.7 (L) 11/08/2019   MCV 100.0 11/08/2019   PLT 77 (L) 11/08/2019     STUDIES: No results found. ONCOLOGY HISTORY:  Patient initially received cladribine in 2006 and then was treated for a relapse in 2011 and again in 2014. Bone marrow biopsy at that time confirmed recurrence of disease with 80-90% hairy cell leukemia noted in his specimen. Patient completed cladribine 0.15 mg/kg on days 1 through 5 along with 8 weekly cycles of Rituxan on April 18, 2015. Bone marrow biopsy results after completion of 8 weeks of Rituxan without evidence of residual disease.  Patient typically has no evidence of disease and peripheral blood.   ASSESSMENT: Recurrent hairy cell leukemia.  PLAN:   1.  Recurrent hairy  cell leukemia: See oncology history as above.  Bone marrow biopsy on November 02, 2019 revealed recurrent hairy cell leukemia, but percentage of involvement was not reported.  After lengthy discussion with the patient and his son, he has agreed to repeat treatment he received in 2016 which included cladribine 0.15 mg/kg on days 1 through 5 along with 8 weekly cycles of Rituxan.  Cladribine will be dose reduced secondary to renal insufficiency.  Proceed with cladribine and Rituxan today.  Return to clinic daily for cladribine and then in 1 week for repeat laboratory, further evaluation, and continuation of Rituxan.   2. Thrombocytopenia: Secondary to recurrent disease.  Treatment as above. 3. Weakness and fatigue: Patient does not complain of this today. 4. GERD: Chronic and unchanged.  Continue over-the-counter Zantac and Tums as needed. 5.  Neutropenia: Secondary to recurrent disease.  Proceed with treatment as above.  I spent a total of 30 minutes reviewing chart data, face-to-face evaluation with the patient, counseling and coordination of care as detailed above.   Patient expressed understanding and was in agreement with this plan. He also understands that He can call clinic at any time with any questions, concerns, or complaints.    [name], MD 11/08/19 12:51 PM

## 2019-11-08 ENCOUNTER — Inpatient Hospital Stay (HOSPITAL_BASED_OUTPATIENT_CLINIC_OR_DEPARTMENT_OTHER): Payer: PPO | Admitting: Oncology

## 2019-11-08 ENCOUNTER — Encounter: Payer: Self-pay | Admitting: Emergency Medicine

## 2019-11-08 ENCOUNTER — Inpatient Hospital Stay
Admission: EM | Admit: 2019-11-08 | Discharge: 2019-11-12 | DRG: 808 | Disposition: A | Discharge status: Home / Self Care | Payer: PPO | Attending: Internal Medicine | Admitting: Internal Medicine

## 2019-11-08 ENCOUNTER — Inpatient Hospital Stay: Payer: PPO | Attending: Oncology

## 2019-11-08 ENCOUNTER — Inpatient Hospital Stay: Payer: PPO

## 2019-11-08 ENCOUNTER — Emergency Department: Payer: PPO

## 2019-11-08 ENCOUNTER — Other Ambulatory Visit: Payer: Self-pay

## 2019-11-08 DIAGNOSIS — Z7982 Long term (current) use of aspirin: Secondary | ICD-10-CM | POA: Insufficient documentation

## 2019-11-08 DIAGNOSIS — I340 Nonrheumatic mitral (valve) insufficiency: Secondary | ICD-10-CM | POA: Diagnosis not present

## 2019-11-08 DIAGNOSIS — R531 Weakness: Secondary | ICD-10-CM | POA: Diagnosis not present

## 2019-11-08 DIAGNOSIS — Z951 Presence of aortocoronary bypass graft: Secondary | ICD-10-CM | POA: Insufficient documentation

## 2019-11-08 DIAGNOSIS — M1990 Unspecified osteoarthritis, unspecified site: Secondary | ICD-10-CM | POA: Diagnosis not present

## 2019-11-08 DIAGNOSIS — I10 Essential (primary) hypertension: Secondary | ICD-10-CM | POA: Diagnosis not present

## 2019-11-08 DIAGNOSIS — K219 Gastro-esophageal reflux disease without esophagitis: Secondary | ICD-10-CM | POA: Diagnosis present

## 2019-11-08 DIAGNOSIS — I129 Hypertensive chronic kidney disease with stage 1 through stage 4 chronic kidney disease, or unspecified chronic kidney disease: Secondary | ICD-10-CM | POA: Insufficient documentation

## 2019-11-08 DIAGNOSIS — F0281 Dementia in other diseases classified elsewhere with behavioral disturbance: Secondary | ICD-10-CM | POA: Diagnosis present

## 2019-11-08 DIAGNOSIS — R7881 Bacteremia: Secondary | ICD-10-CM | POA: Diagnosis present

## 2019-11-08 DIAGNOSIS — N390 Urinary tract infection, site not specified: Secondary | ICD-10-CM | POA: Diagnosis not present

## 2019-11-08 DIAGNOSIS — R509 Fever, unspecified: Secondary | ICD-10-CM

## 2019-11-08 DIAGNOSIS — N189 Chronic kidney disease, unspecified: Secondary | ICD-10-CM | POA: Insufficient documentation

## 2019-11-08 DIAGNOSIS — I361 Nonrheumatic tricuspid (valve) insufficiency: Secondary | ICD-10-CM | POA: Diagnosis not present

## 2019-11-08 DIAGNOSIS — D649 Anemia, unspecified: Secondary | ICD-10-CM | POA: Diagnosis not present

## 2019-11-08 DIAGNOSIS — B958 Unspecified staphylococcus as the cause of diseases classified elsewhere: Secondary | ICD-10-CM | POA: Diagnosis present

## 2019-11-08 DIAGNOSIS — C9142 Hairy cell leukemia, in relapse: Secondary | ICD-10-CM | POA: Diagnosis not present

## 2019-11-08 DIAGNOSIS — R718 Other abnormality of red blood cells: Secondary | ICD-10-CM

## 2019-11-08 DIAGNOSIS — E785 Hyperlipidemia, unspecified: Secondary | ICD-10-CM | POA: Diagnosis present

## 2019-11-08 DIAGNOSIS — R5081 Fever presenting with conditions classified elsewhere: Secondary | ICD-10-CM

## 2019-11-08 DIAGNOSIS — I2510 Atherosclerotic heart disease of native coronary artery without angina pectoris: Secondary | ICD-10-CM | POA: Diagnosis present

## 2019-11-08 DIAGNOSIS — D594 Other nonautoimmune hemolytic anemias: Secondary | ICD-10-CM | POA: Diagnosis not present

## 2019-11-08 DIAGNOSIS — D696 Thrombocytopenia, unspecified: Secondary | ICD-10-CM | POA: Diagnosis not present

## 2019-11-08 DIAGNOSIS — Z9225 Personal history of immunosupression therapy: Secondary | ICD-10-CM | POA: Diagnosis not present

## 2019-11-08 DIAGNOSIS — Z8249 Family history of ischemic heart disease and other diseases of the circulatory system: Secondary | ICD-10-CM

## 2019-11-08 DIAGNOSIS — Z974 Presence of external hearing-aid: Secondary | ICD-10-CM

## 2019-11-08 DIAGNOSIS — H9190 Unspecified hearing loss, unspecified ear: Secondary | ICD-10-CM | POA: Diagnosis not present

## 2019-11-08 DIAGNOSIS — N400 Enlarged prostate without lower urinary tract symptoms: Secondary | ICD-10-CM | POA: Diagnosis not present

## 2019-11-08 DIAGNOSIS — G309 Alzheimer's disease, unspecified: Secondary | ICD-10-CM | POA: Diagnosis not present

## 2019-11-08 DIAGNOSIS — Z20822 Contact with and (suspected) exposure to covid-19: Secondary | ICD-10-CM | POA: Diagnosis present

## 2019-11-08 DIAGNOSIS — D709 Neutropenia, unspecified: Secondary | ICD-10-CM | POA: Diagnosis present

## 2019-11-08 DIAGNOSIS — Z8261 Family history of arthritis: Secondary | ICD-10-CM

## 2019-11-08 DIAGNOSIS — F0280 Dementia in other diseases classified elsewhere without behavioral disturbance: Secondary | ICD-10-CM | POA: Diagnosis not present

## 2019-11-08 DIAGNOSIS — T451X5A Adverse effect of antineoplastic and immunosuppressive drugs, initial encounter: Secondary | ICD-10-CM | POA: Diagnosis not present

## 2019-11-08 DIAGNOSIS — D849 Immunodeficiency, unspecified: Secondary | ICD-10-CM | POA: Diagnosis not present

## 2019-11-08 DIAGNOSIS — F0390 Unspecified dementia without behavioral disturbance: Secondary | ICD-10-CM | POA: Insufficient documentation

## 2019-11-08 DIAGNOSIS — D6959 Other secondary thrombocytopenia: Secondary | ICD-10-CM | POA: Insufficient documentation

## 2019-11-08 DIAGNOSIS — R0902 Hypoxemia: Secondary | ICD-10-CM | POA: Diagnosis not present

## 2019-11-08 DIAGNOSIS — R000 Tachycardia, unspecified: Secondary | ICD-10-CM | POA: Diagnosis not present

## 2019-11-08 DIAGNOSIS — E876 Hypokalemia: Secondary | ICD-10-CM | POA: Diagnosis not present

## 2019-11-08 DIAGNOSIS — C9140 Hairy cell leukemia not having achieved remission: Secondary | ICD-10-CM | POA: Insufficient documentation

## 2019-11-08 DIAGNOSIS — R0602 Shortness of breath: Secondary | ICD-10-CM | POA: Diagnosis not present

## 2019-11-08 DIAGNOSIS — Z01810 Encounter for preprocedural cardiovascular examination: Secondary | ICD-10-CM | POA: Diagnosis not present

## 2019-11-08 DIAGNOSIS — Z79899 Other long term (current) drug therapy: Secondary | ICD-10-CM | POA: Insufficient documentation

## 2019-11-08 DIAGNOSIS — D61810 Antineoplastic chemotherapy induced pancytopenia: Secondary | ICD-10-CM | POA: Diagnosis not present

## 2019-11-08 DIAGNOSIS — N1831 Chronic kidney disease, stage 3a: Secondary | ICD-10-CM | POA: Diagnosis not present

## 2019-11-08 DIAGNOSIS — D65 Disseminated intravascular coagulation [defibrination syndrome]: Secondary | ICD-10-CM | POA: Diagnosis not present

## 2019-11-08 DIAGNOSIS — D72819 Decreased white blood cell count, unspecified: Secondary | ICD-10-CM | POA: Diagnosis not present

## 2019-11-08 DIAGNOSIS — N183 Chronic kidney disease, stage 3 unspecified: Secondary | ICD-10-CM | POA: Diagnosis not present

## 2019-11-08 DIAGNOSIS — R6510 Systemic inflammatory response syndrome (SIRS) of non-infectious origin without acute organ dysfunction: Secondary | ICD-10-CM | POA: Diagnosis present

## 2019-11-08 DIAGNOSIS — D61818 Other pancytopenia: Secondary | ICD-10-CM | POA: Diagnosis not present

## 2019-11-08 HISTORY — DX: Fever presenting with conditions classified elsewhere: R50.81

## 2019-11-08 HISTORY — DX: Neutropenia, unspecified: D70.9

## 2019-11-08 LAB — CBC WITH DIFFERENTIAL/PLATELET
Abs Immature Granulocytes: 0.01 10*3/uL (ref 0.00–0.07)
Abs Immature Granulocytes: 0 10*3/uL (ref 0.00–0.07)
Basophils Absolute: 0 10*3/uL (ref 0.0–0.1)
Basophils Absolute: 0 10*3/uL (ref 0.0–0.1)
Basophils Relative: 1 %
Basophils Relative: 0 %
Eosinophils Absolute: 0.1 10*3/uL (ref 0.0–0.5)
Eosinophils Absolute: 0 10*3/uL (ref 0.0–0.5)
Eosinophils Relative: 5 %
Eosinophils Relative: 0 %
HCT: 30.7 % — ABNORMAL LOW (ref 39.0–52.0)
HCT: 34.1 % — ABNORMAL LOW (ref 39.0–52.0)
Hemoglobin: 10.4 g/dL — ABNORMAL LOW (ref 13.0–17.0)
Hemoglobin: 11.4 g/dL — ABNORMAL LOW (ref 13.0–17.0)
Immature Granulocytes: 1 %
Immature Granulocytes: 0 %
Lymphocytes Relative: 22 %
Lymphocytes Relative: 22 %
Lymphs Abs: 0.3 10*3/uL — ABNORMAL LOW (ref 0.7–4.0)
Lymphs Abs: 0 10*3/uL — ABNORMAL LOW (ref 0.7–4.0)
MCH: 33.9 pg (ref 26.0–34.0)
MCH: 33.6 pg (ref 26.0–34.0)
MCHC: 33.9 g/dL (ref 30.0–36.0)
MCHC: 33.4 g/dL (ref 30.0–36.0)
MCV: 100 fL (ref 80.0–100.0)
MCV: 100.6 fL — ABNORMAL HIGH (ref 80.0–100.0)
Monocytes Absolute: 0.1 10*3/uL (ref 0.1–1.0)
Monocytes Absolute: 0 10*3/uL — ABNORMAL LOW (ref 0.1–1.0)
Monocytes Relative: 3 %
Monocytes Relative: 0 %
Neutro Abs: 1 10*3/uL — ABNORMAL LOW (ref 1.7–7.7)
Neutro Abs: 0.1 10*3/uL — ABNORMAL LOW (ref 1.7–7.7)
Neutrophils Relative %: 68 %
Neutrophils Relative %: 78 %
Platelets: 77 10*3/uL — ABNORMAL LOW (ref 150–400)
Platelets: 24 10*3/uL — CL (ref 150–400)
RBC: 3.07 MIL/uL — ABNORMAL LOW (ref 4.22–5.81)
RBC: 3.39 MIL/uL — ABNORMAL LOW (ref 4.22–5.81)
RDW: 15.2 % (ref 11.5–15.5)
RDW: 15.4 % (ref 11.5–15.5)
WBC: 1.5 10*3/uL — ABNORMAL LOW (ref 4.0–10.5)
WBC: 0.2 10*3/uL — CL (ref 4.0–10.5)
nRBC: 0 % (ref 0.0–0.2)
nRBC: 0 % (ref 0.0–0.2)

## 2019-11-08 LAB — COMPREHENSIVE METABOLIC PANEL WITH GFR
ALT: 21 U/L (ref 0–44)
AST: 62 U/L — ABNORMAL HIGH (ref 15–41)
Albumin: 4.2 g/dL (ref 3.5–5.0)
Alkaline Phosphatase: 70 U/L (ref 38–126)
Anion gap: 9 (ref 5–15)
BUN: 20 mg/dL (ref 8–23)
CO2: 23 mmol/L (ref 22–32)
Calcium: 9 mg/dL (ref 8.9–10.3)
Chloride: 110 mmol/L (ref 98–111)
Creatinine, Ser: 1.48 mg/dL — ABNORMAL HIGH (ref 0.61–1.24)
GFR calc Af Amer: 49 mL/min — ABNORMAL LOW (ref >60)
GFR calc non Af Amer: 43 mL/min — ABNORMAL LOW (ref >60)
Glucose, Bld: 106 mg/dL — ABNORMAL HIGH (ref 70–99)
Potassium: 3.7 mmol/L (ref 3.5–5.1)
Sodium: 142 mmol/L (ref 135–145)
Total Bilirubin: 1.1 mg/dL (ref 0.3–1.2)
Total Protein: 6.6 g/dL (ref 6.5–8.1)

## 2019-11-08 LAB — BASIC METABOLIC PANEL
Anion gap: 7 (ref 5–15)
BUN: 18 mg/dL (ref 8–23)
CO2: 25 mmol/L (ref 22–32)
Calcium: 9.1 mg/dL (ref 8.9–10.3)
Chloride: 110 mmol/L (ref 98–111)
Creatinine, Ser: 1.6 mg/dL — ABNORMAL HIGH (ref 0.61–1.24)
GFR calc Af Amer: 45 mL/min — ABNORMAL LOW (ref >60)
GFR calc non Af Amer: 39 mL/min — ABNORMAL LOW (ref >60)
Glucose, Bld: 113 mg/dL — ABNORMAL HIGH (ref 70–99)
Potassium: 3.8 mmol/L (ref 3.5–5.1)
Sodium: 142 mmol/L (ref 135–145)

## 2019-11-08 LAB — HEPATITIS B SURFACE ANTIGEN: Hepatitis B Surface Ag: NONREACTIVE

## 2019-11-08 LAB — LACTATE DEHYDROGENASE: LDH: 167 U/L (ref 98–192)

## 2019-11-08 LAB — URINALYSIS, ROUTINE W REFLEX MICROSCOPIC
Bacteria, UA: NONE SEEN
Bilirubin Urine: NEGATIVE
Glucose, UA: NEGATIVE mg/dL
Hgb urine dipstick: NEGATIVE
Ketones, ur: NEGATIVE mg/dL
Leukocytes,Ua: NEGATIVE
Nitrite: NEGATIVE
Protein, ur: 30 mg/dL — AB
Specific Gravity, Urine: 1.024 (ref 1.005–1.030)
pH: 5 (ref 5.0–8.0)

## 2019-11-08 LAB — PROCALCITONIN: Procalcitonin: 1.9 ng/mL

## 2019-11-08 LAB — FIBRIN DERIVATIVES D-DIMER (ARMC ONLY): Fibrin derivatives D-dimer (ARMC): >7500 ng/mL (FEU) — ABNORMAL HIGH (ref 0.00–499.00)

## 2019-11-08 LAB — HEPATITIS B CORE ANTIBODY, TOTAL: Hep B Core Total Ab: NONREACTIVE

## 2019-11-08 LAB — LACTIC ACID, PLASMA
Lactic Acid, Venous: 3.4 mmol/L (ref 0.5–1.9)
Lactic Acid, Venous: 1.7 mmol/L (ref 0.5–1.9)

## 2019-11-08 LAB — SARS CORONAVIRUS 2 (TAT 6-24 HRS): SARS Coronavirus 2: NEGATIVE

## 2019-11-08 LAB — APTT: aPTT: 40 seconds — ABNORMAL HIGH (ref 24–36)

## 2019-11-08 LAB — PROTIME-INR
INR: 1.7 — ABNORMAL HIGH (ref 0.8–1.2)
Prothrombin Time: 20.3 seconds — ABNORMAL HIGH (ref 11.4–15.2)

## 2019-11-08 LAB — BRAIN NATRIURETIC PEPTIDE: B Natriuretic Peptide: 99 pg/mL (ref 0.0–100.0)

## 2019-11-08 LAB — FIBRINOGEN: Fibrinogen: 93 mg/dL — CL (ref 210–475)

## 2019-11-08 LAB — TROPONIN I (HIGH SENSITIVITY): Troponin I (High Sensitivity): 19 ng/L — ABNORMAL HIGH (ref <18)

## 2019-11-08 MED ORDER — IOHEXOL 350 MG/ML SOLN
60.0000 mL | Freq: Once | INTRAVENOUS | Status: AC | PRN
  Administered 2019-11-08: 60 mL via INTRAVENOUS

## 2019-11-08 MED ORDER — METHYLPREDNISOLONE SODIUM SUCC 125 MG IJ SOLR
125.0000 mg | INTRAMUSCULAR | Status: AC
  Administered 2019-11-08: 125 mg via INTRAVENOUS
  Filled 2019-11-08: qty 2

## 2019-11-08 MED ORDER — ACETAMINOPHEN 325 MG PO TABS
650.0000 mg | ORAL_TABLET | Freq: Four times a day (QID) | ORAL | Status: DC | PRN
  Administered 2019-11-12: 650 mg via ORAL
  Filled 2019-11-08: qty 2

## 2019-11-08 MED ORDER — ACETAMINOPHEN 650 MG RE SUPP: 650.0000 mg | Freq: Four times a day (QID) | RECTAL | Status: DC | PRN

## 2019-11-08 MED ORDER — SODIUM CHLORIDE 0.9 % IV SOLN
375.0000 mg/m2 | Freq: Once | INTRAVENOUS | Status: AC
  Administered 2019-11-08: 700 mg via INTRAVENOUS
  Filled 2019-11-08: qty 50

## 2019-11-08 MED ORDER — SODIUM CHLORIDE 0.9 % IV BOLUS
500.0000 mL | Freq: Once | INTRAVENOUS | Status: AC
  Administered 2019-11-08: 500 mL via INTRAVENOUS

## 2019-11-08 MED ORDER — VANCOMYCIN HCL IN DEXTROSE 1-5 GM/200ML-% IV SOLN
1000.0000 mg | Freq: Once | INTRAVENOUS | Status: AC
  Administered 2019-11-08: 1000 mg via INTRAVENOUS
  Filled 2019-11-08: qty 200

## 2019-11-08 MED ORDER — PROCHLORPERAZINE MALEATE 10 MG PO TABS
10.0000 mg | ORAL_TABLET | Freq: Four times a day (QID) | ORAL | Status: DC | PRN
  Filled 2019-11-08: qty 1

## 2019-11-08 MED ORDER — ACETAMINOPHEN 325 MG PO TABS
650.0000 mg | ORAL_TABLET | Freq: Once | ORAL | Status: AC
  Administered 2019-11-08: 650 mg via ORAL
  Filled 2019-11-08: qty 2

## 2019-11-08 MED ORDER — VANCOMYCIN HCL 1250 MG/250ML IV SOLN
1250.0000 mg | INTRAVENOUS | Status: DC
  Administered 2019-11-10: 1250 mg via INTRAVENOUS
  Filled 2019-11-08 (×2): qty 250

## 2019-11-08 MED ORDER — ACETAMINOPHEN 325 MG PO TABS
650.0000 mg | ORAL_TABLET | Freq: Once | ORAL | Status: AC
  Administered 2019-11-08: 650 mg via ORAL

## 2019-11-08 MED ORDER — SODIUM CHLORIDE 0.9 % IV SOLN
2.0000 g | Freq: Once | INTRAVENOUS | Status: AC
  Administered 2019-11-08: 2 g via INTRAVENOUS
  Filled 2019-11-08: qty 2

## 2019-11-08 MED ORDER — PROCHLORPERAZINE MALEATE 10 MG PO TABS
10.0000 mg | ORAL_TABLET | Freq: Once | ORAL | Status: AC
  Administered 2019-11-08: 10 mg via ORAL

## 2019-11-08 MED ORDER — DIPHENHYDRAMINE HCL 25 MG PO CAPS
25.0000 mg | ORAL_CAPSULE | Freq: Once | ORAL | Status: AC
  Administered 2019-11-08: 25 mg via ORAL

## 2019-11-08 MED ORDER — SODIUM CHLORIDE 0.9 % IV SOLN
2.0000 g | Freq: Two times a day (BID) | INTRAVENOUS | Status: DC
  Administered 2019-11-09 – 2019-11-10 (×3): 2 g via INTRAVENOUS
  Filled 2019-11-08 (×4): qty 2

## 2019-11-08 MED ORDER — SODIUM CHLORIDE 0.9 % IV SOLN
0.1100 mg/kg | Freq: Once | INTRAVENOUS | Status: AC
  Administered 2019-11-08: 8 mg via INTRAVENOUS
  Filled 2019-11-08: qty 8

## 2019-11-08 MED ORDER — SODIUM CHLORIDE 0.9 % IV SOLN: INTRAVENOUS | Status: DC

## 2019-11-08 MED ORDER — ADULT MULTIVITAMIN W/MINERALS CH
1.0000 | ORAL_TABLET | Freq: Every day | ORAL | Status: DC
  Administered 2019-11-09 – 2019-11-12 (×4): 1 via ORAL
  Filled 2019-11-08 (×4): qty 1

## 2019-11-08 MED ORDER — METRONIDAZOLE IN NACL 5-0.79 MG/ML-% IV SOLN
500.0000 mg | Freq: Once | INTRAVENOUS | Status: AC
  Administered 2019-11-08: 500 mg via INTRAVENOUS
  Filled 2019-11-08: qty 100

## 2019-11-08 MED ORDER — ONDANSETRON HCL 4 MG PO TABS: 8.0000 mg | ORAL_TABLET | Freq: Two times a day (BID) | ORAL | Status: DC | PRN

## 2019-11-08 MED ORDER — DONEPEZIL HCL 5 MG PO TABS
10.0000 mg | ORAL_TABLET | Freq: Every day | ORAL | Status: DC
  Administered 2019-11-08 – 2019-11-11 (×3): 10 mg via ORAL
  Filled 2019-11-08 (×6): qty 2

## 2019-11-08 MED ORDER — SODIUM CHLORIDE 0.9 % IV SOLN
Freq: Once | INTRAVENOUS | Status: AC
  Filled 2019-11-08: qty 250

## 2019-11-08 MED ORDER — POLYETHYLENE GLYCOL 3350 17 G PO PACK: 17.0000 g | PACK | Freq: Every day | ORAL | Status: DC | PRN

## 2019-11-08 MED ORDER — TAMSULOSIN HCL 0.4 MG PO CAPS
0.4000 mg | ORAL_CAPSULE | Freq: Every day | ORAL | Status: DC
  Administered 2019-11-09 – 2019-11-12 (×4): 0.4 mg via ORAL
  Filled 2019-11-08 (×4): qty 1

## 2019-11-08 NOTE — ED Triage Notes (Signed)
Pt from [facility] for possible allergic reaction. 4th time receiving chemo therapy.  febrile on arrival.  sats in 70s at [place] [facility]. Having tremors.  On 2 L [place].  Appears labored but pt denies [facility]

## 2019-11-08 NOTE — Consult Note (Signed)
PHARMACY -  BRIEF ANTIBIOTIC NOTE   Pharmacy has received consult(s) for Cefepime and Vancomycin from an ED provider.  The patient's profile has been reviewed for ht/wt/allergies/indication/available labs.    One time order(s) placed for Cefepime 2g x1 dose and Vancomycin 1g x1 dose  Further antibiotics/pharmacy consults should be ordered by admitting physician if indicated.                       Thank you, Rowland Lathe 11/08/2019  5:47 PM

## 2019-11-08 NOTE — ED Provider Notes (Signed)
[facility] Emergency Department Provider Note   ____________________________________________   First MD Initiated Contact with Patient 11/08/19 1628     (approximate)  I have reviewed the triage vital signs and the nursing notes.   HISTORY  Chief Complaint Allergic Reaction  EM caveat: Limited some due to acuity of condition critical nature  HPI Frank Mcdaniel is a 86 y.o. male sudden onset feeling fatigue and not feeling well nearly following chemotherapy  No pain or discomfort.  Notably hypoxic   Past Medical History:  Diagnosis Date  . Anemia 09/01/2015  . Anxiety 09/11/2015  . Arthritis   . Asthma    as a teenager  . Asymptomatic Sinus Bradycardia   . Atypical chest pain    a. 08/2014  . BPH (benign prostatic hyperplasia)   . CKD (chronic kidney disease), stage III   . Closed fracture of tuft of distal phalanx of finger 08/31/2018  . Coronary artery disease    a. 2000 s/p CABG;  b. 10/2008 Neg MV, EF 64%; c. cath 05/2015: LM 85% sev cal, pLAD-1 lesion 80%, pLAD-2 lesion 100% chronic, ostLCx 70% sev cal, OM4 90%, ostRCA 70%, mid RCA 90%, LIMA-LAD patent, VG-OM2 patent, VG-OM3 patent, VG-RPDA patent. No AS  . Dementia without behavioral disturbance ([place]) 05/28/2019  . GERD (gastroesophageal reflux disease)   . H/O echocardiogram    a. 10/2005 Echo: nl EF.  [name] Hairy cell leukemia ([place])   . HOH (hard of hearing)    Bilateral hearing aids  . HTN (hypertension)   . Hyperlipidemia   . Hypertension   . Leukemia ([place])   . Stricture and stenosis of esophagus     Patient Active Problem List   Diagnosis Date Noted  . Neutropenic fever ([place]) 11/08/2019  . Goals of care, counseling/discussion 10/18/2019  . IBS (irritable bowel syndrome) 08/02/2019  . Dementia without behavioral disturbance ([place]) 05/28/2019  . Status post inguinal hernia repair 09/13/2016  . Constipation 05/21/2016  . Memory loss or impairment 02/15/2016  . Chronic fatigue  12/21/2015  . Bilateral low back pain without sciatica 12/21/2015  . Anxiety 09/11/2015  . Anemia 09/01/2015  . GERD (gastroesophageal reflux disease) 06/02/2015  . Asthma 01/28/2015  . BPH (benign prostatic hyperplasia) 01/28/2015  . Hairy cell leukemia ([place]) 01/28/2015  . Coronary artery disease   . S/P CABG (coronary artery bypass graft) 11/08/2010  . HTN (hypertension) 11/08/2010  . Bradycardia 11/08/2010  . Hyperlipidemia 10/27/2009    Past Surgical History:  Procedure Laterality Date  . CARDIAC CATHETERIZATION N/A 05/26/2015   Procedure: Left Heart Cath;  Surgeon: [name], MD;  Location: [place] [facility];  Service: Cardiovascular;  Laterality: N/A;  . CARDIAC CATHETERIZATION N/A 05/26/2015   Procedure: Coronary/Graft Angiography;  Surgeon: [name], MD;  Location: [place] [facility];  Service: Cardiovascular;  Laterality: N/A;  . CATARACT EXTRACTION    . CORONARY ARTERY BYPASS GRAFT  2000  . ESOPHAGOGASTRODUODENOSCOPY N/A 02/18/2017   Procedure: ESOPHAGOGASTRODUODENOSCOPY (EGD) with removal of food bolus;  Surgeon: [name], MD;  Location: [facility] ENDOSCOPY;  Service: Endoscopy;  Laterality: N/A;  . ESOPHAGOGASTRODUODENOSCOPY (EGD) WITH PROPOFOL N/A 03/11/2017   Procedure: ESOPHAGOGASTRODUODENOSCOPY (EGD) WITH PROPOFOL;  Surgeon: [name], MD;  Location: [facility] ENDOSCOPY;  Service: Endoscopy;  Laterality: N/A;  . ESOPHAGOGASTRODUODENOSCOPY (EGD) WITH PROPOFOL N/A 04/15/2017   Procedure: ESOPHAGOGASTRODUODENOSCOPY (EGD) WITH PROPOFOL;  Surgeon: [name], MD;  Location: [facility] ENDOSCOPY;  Service: Endoscopy;  Laterality: N/A;  . EYE SURGERY Left  Cataract Extraction with IOL  . INGUINAL HERNIA REPAIR Left 06/24/2016   Procedure: HERNIA REPAIR INGUINAL ADULT;  Surgeon: [name], MD;  Location: [facility] ORS;  Service: General;  Laterality: Left;  . TONSILLECTOMY  1942   . UPPER GASTROINTESTINAL ENDOSCOPY  3 years ago    with Dilation.     Prior  to Admission medications   Medication Sig Start Date End Date Taking? Authorizing Provider  aspirin 81 MG EC tablet Take 2 tablets (162 mg total) by mouth daily. 11/08/10  Yes Gollan, Kathlene November, MD  atorvastatin (LIPITOR) 40 MG tablet TAKE 1/2 TABLETS (20 MG TOTAL) BY MOUTH DAILY. 10/26/19  Yes [name], MD  donepezil (ARICEPT) 10 MG tablet Take 10 mg by mouth at bedtime.   Yes [provider]  isosorbide mononitrate (IMDUR) 30 MG 24 hr tablet TAKE 1 TABLET BY MOUTH EVERY DAY 08/31/19  Yes Gollan, Kathlene November, MD  Multiple Vitamin (MULTIVITAMIN) tablet Take 1 tablet by mouth daily.     Yes [provider]  tamsulosin (FLOMAX) 0.4 MG CAPS capsule TAKE 1 CAPSULES (0.4 MG TOTAL) BY MOUTH DAILY. 09/01/19  Yes [name], MD  Calcium Carbonate Antacid (TUMS CHEWY BITES PO) Take 1 tablet by mouth as needed.     [provider]  CLINPRO 5000 1.1 % PSTE APPLY PEASIZED AMOUNT, BRUSH DAILY AT LEAST 2 MINUTES. SPIT AFTER USE 04/11/19   [provider]  ondansetron (ZOFRAN) 8 MG tablet Take 1 tablet (8 mg total) by mouth 2 (two) times daily as needed (Nausea or vomiting). 10/18/19   [name], MD  polyethylene glycol (MIRALAX / Floria Raveling) packet Take 17 g by mouth daily as needed.     [provider]  prochlorperazine (COMPAZINE) 10 MG tablet Take 1 tablet (10 mg total) by mouth every 6 (six) hours as needed (Nausea or vomiting). 10/18/19   [name], MD    Allergies Patient has no known allergies.  Family History  Problem Relation Age of Onset  . Hypertension Mother   . Arthritis Father   . Other Other        no premature CAD.  [name] Colon cancer Neg Hx   . Prostate cancer Neg Hx   . Stomach cancer Neg Hx     Social History Social History   Tobacco Use  . Smoking status: Never Smoker  . Smokeless tobacco: Never Used  Substance Use Topics  . Alcohol use: No  . Drug use: No    Review of Systems Constitutional: No  fever/chills or known recent illness other than going through chemotherapy Eyes: No visual changes. ENT: No sore throat. Cardiovascular: Denies chest pain. Respiratory: Denies shortness of breath.  Maybe feels little short of breath after chemo Gastrointestinal: No abdominal pain.   Genitourinary: Negative for dysuria. Musculoskeletal: Negative for back pain. Skin: Negative for rash. Neurological: Negative for headaches, areas of focal weakness or numbness.    ____________________________________________   PHYSICAL EXAM:  VITAL SIGNS: ED Triage Vitals  Enc Vitals Group     BP 11/08/19 1633 (!) 161/100     Pulse Rate 11/08/19 1633 (!) 154     Resp 11/08/19 1633 (!) 26     Temp 11/08/19 1633 (!) 101 F (38.3 C)     Temp Source 11/08/19 1633 Oral     SpO2 11/08/19 1633 100 %     Weight 11/08/19 1632 168 lb 6.9 oz (76.4 kg)     Height --  Head Circumference --      Peak Flow --      Pain Score 11/08/19 1632 0     Pain Loc --      Pain Edu? --      Excl. in [place]? --     Constitutional: Alert and oriented.  Moderately ill-appearing, somewhat tachypnea, fully alert Eyes: Conjunctivae are normal. Head: Atraumatic. Nose: No congestion/rhinnorhea. Mouth/Throat: Mucous membranes are moist. Neck: No stridor.  Cardiovascular: Tachycardic ate, regular rhythm. Grossly normal heart sounds.  Good peripheral circulation. Respiratory: Mildly tachypneic no retractions. Lungs CTAB. Gastrointestinal: Soft and nontender. No distention. Musculoskeletal: No lower extremity tenderness nor edema. Neurologic:  Normal speech and language. No gross focal neurologic deficits are appreciated.  Skin:  Skin is warm, dry and intact. No rash noted. Psychiatric: Mood and affect are normal. Speech and behavior are normal.  ____________________________________________   LABS (all labs ordered are listed, but only abnormal results are displayed)  Labs Reviewed  LACTIC ACID, PLASMA - Abnormal;  Notable for the following components:      Result Value   Lactic Acid, Venous 3.4 (*)    All other components within normal limits  COMPREHENSIVE METABOLIC PANEL - Abnormal; Notable for the following components:   Glucose, Bld 106 (*)    Creatinine, Ser 1.48 (*)    AST 62 (*)    GFR calc non Af Amer 43 (*)    GFR calc Af Amer 49 (*)    All other components within normal limits  CBC WITH DIFFERENTIAL/PLATELET - Abnormal; Notable for the following components:   WBC 0.2 (*)    RBC 3.39 (*)    Hemoglobin 11.4 (*)    HCT 34.1 (*)    MCV 100.6 (*)    Platelets 24 (*)    Neutro Abs 0.1 (*)    Lymphs Abs 0.0 (*)    Monocytes Absolute 0.0 (*)    All other components within normal limits  APTT - Abnormal; Notable for the following components:   aPTT 40 (*)    All other components within normal limits  PROTIME-INR - Abnormal; Notable for the following components:   Prothrombin Time 20.3 (*)    INR 1.7 (*)    All other components within normal limits  TROPONIN I (HIGH SENSITIVITY) - Abnormal; Notable for the following components:   Troponin I (High Sensitivity) 19 (*)    All other components within normal limits  CULTURE, BLOOD (ROUTINE X 2)  CULTURE, BLOOD (ROUTINE X 2)  URINE CULTURE  SARS CORONAVIRUS 2 (TAT 6-24 HRS)  BRAIN NATRIURETIC PEPTIDE  PROCALCITONIN  LACTIC ACID, PLASMA  URINALYSIS, ROUTINE W REFLEX MICROSCOPIC  BASIC METABOLIC PANEL  CBC  POC SARS CORONAVIRUS 2 AG -  ED   ____________________________________________  EKG  Reviewed inter by me at 1640 Heart rate 125 QRS 130 QTc 470 Sinus tachycardia, right bundle branch block, somewhat diffuse nonspecific T wave abnormalities. ____________________________________________  RADIOLOGY  CT Angio Chest PE W and/or Wo Contrast  Result Date: 11/08/2019 CLINICAL DATA:  Hypoxia, fever EXAM: CT ANGIOGRAPHY CHEST WITH CONTRAST TECHNIQUE: Multidetector CT imaging of the chest was performed using the standard protocol  during bolus administration of intravenous contrast. Multiplanar CT image reconstructions and MIPs were obtained to evaluate the vascular anatomy. CONTRAST:  22mL OMNIPAQUE IOHEXOL 350 MG/ML SOLN COMPARISON:  01/13/2015 FINDINGS: Cardiovascular: No filling defects in the pulmonary arteries to suggest pulmonary emboli. Heart is normal size. Prior CABG. Aortic atherosclerosis. No aneurysm. Mediastinum/Nodes: No mediastinal, hilar, or  axillary adenopathy. Small hiatal hernia. Esophagus mildly distended with gas and fluid may be related to reflux. Distal esophageal wall appears mildly thickened, possibly related to reflux esophagitis. Lungs/Pleura: Lungs are clear. No focal airspace opacities or suspicious nodules. No effusions. Upper Abdomen: Gallstone noted with fluid in the gallbladder, stable. No acute findings. Musculoskeletal: Chest wall soft tissues are unremarkable. No acute bony abnormality. Review of the MIP images confirms the above findings. IMPRESSION: No evidence of pulmonary embolus. Prior CABG. Small hiatal hernia. Distal esophageal wall thickening and mild distention of the esophagus with air and fluid. Findings may be related to reflux. Aortic Atherosclerosis (ICD10-I70.0). Electronically Signed   By: Rolm Baptise M.D.   On: 11/08/2019 17:46   DG Chest Port 1 View  Result Date: 11/08/2019 CLINICAL DATA:  Shortness of breath EXAM: PORTABLE CHEST 1 VIEW COMPARISON:  04/10/2016 FINDINGS: Prior CABG. Heart is normal size. Low lung volumes. No confluent opacities or effusions. No acute bony abnormality. IMPRESSION: Low lung volumes.  No acute cardiopulmonary disease. Electronically Signed   By: Rolm Baptise M.D.   On: 11/08/2019 17:24    Imaging studies reviewed, negative for acute findings ____________________________________________   PROCEDURES  Procedure(s) performed: 3-lead  .1-3 Lead EKG Interpretation Performed by: Delman Kitten, MD Authorized by: Delman Kitten, MD     Interpretation:  abnormal     ECG rate:  120   ECG rate assessment: tachycardic     Rhythm: sinus rhythm     Ectopy: none     Conduction: normal      Critical Care performed: Yes, see critical care note(s)  CRITICAL CARE Performed by: Delman Kitten   Total critical care time: 30 minutes  Critical care time was exclusive of separately billable procedures and treating other patients.  Critical care was necessary to treat or prevent imminent or life-threatening deterioration.  Critical care was time spent personally by me on the following activities: development of treatment plan with patient and/or surrogate as well as nursing, discussions with consultants, evaluation of patient's response to treatment, examination of patient, obtaining history from patient or surrogate, ordering and performing treatments and interventions, ordering and review of laboratory studies, ordering and review of radiographic studies, pulse oximetry and re-evaluation of patient's condition.  ____________________________________________   INITIAL IMPRESSION / ASSESSMENT AND PLAN / ED COURSE  Pertinent labs & imaging results that were available during my care of the patient were reviewed by me and considered in my medical decision making (see chart for details).     Clinical Course as of Nov 08 1811  Mon Nov 08, 2019  1647 Discussed with CT tech, had labs this AM. STAT CT chest ordered   [MQ]    Clinical Course User Index [MQ] Delman Kitten, MD   ----------------------------------------- 6:13 PM on 11/08/2019 -----------------------------------------  Schistocytes present on the patient's peripheral smear now present.  Await feedback from Dr. [name] on further work-up.  Dr. [name] aware of schistocytes and labs.  Recommending admission to the hospital here, further care and work-up anticipated through the oncology hospitalist services.  Patient improving with regard to hemodynamics and work of breathing.  He and his  son comfortable with plan for admission.  Differential remains broad, I am however concerned about severe potential sepsis or neutropenic fevers, acute cardiac events, acute drug related reactions are all possibilities, schistocytes are seen on his differential in hematology aware and actively following and further work-up  ____________________________________________   FINAL CLINICAL IMPRESSION(S) / ED DIAGNOSES  Final diagnoses:  SIRS (systemic inflammatory response syndrome) (HCC)  Hypoxia  Fever, unspecified fever cause  Neutropenic fever ([place])  Schistocytes on peripheral blood smear        Note:  This document was prepared using Dragon voice recognition software and may include unintentional dictation errors       Delman Kitten, MD 11/08/19 1909

## 2019-11-08 NOTE — ED Notes (Signed)
Attempted to call report, informed by michelle, rn that the floor is not taking bipap until tomorrow. Butch, ED Charge RN notified.

## 2019-11-08 NOTE — ED Notes (Signed)
poc covid negative, patient off unit to ct.

## 2019-11-08 NOTE — ED Notes (Signed)
Dr Ellender Hose made aware at this time of pt's critical Fibrinogen level as reported by lab. Fibrinogen 93 mg/dL

## 2019-11-08 NOTE — Progress Notes (Signed)
Pharmacist Chemotherapy Monitoring - Follow Up Assessment    I verify that I have reviewed each item in the below checklist:  . Regimen for the patient is scheduled for the appropriate day and plan matches scheduled date. [name] Appropriate non-routine labs are ordered dependent on drug ordered. . If applicable, additional medications reviewed and ordered per protocol based on lifetime cumulative doses and/or treatment regimen.   Plan for follow-up and/or issues identified: No . I-vent associated with next due treatment: Yes . MD and/or nursing notified: No  Orbie Grupe K 11/08/2019 9:31 AM

## 2019-11-08 NOTE — Consult Note (Signed)
Pharmacy Antibiotic Note  Frank Mcdaniel is a 86 y.o. male admitted on 11/08/2019 with febrile neutropenia .  Pharmacy has been consulted for Vancomycin/Cefepime dosing.  Cefepime 2 g given today at 1753. Vancomycin 1g ED order has not been given yet.  Plan: 1. Will order Vancomycin 1 g, for a total loading dose of 2g. Vancomycin maintenance dose: 1250 mg Q36H. AUC goal 400-550. Expected AUC 463. Expected Cssmin 10.6. Pharmacy will monitor Scr.   2. Cefepime 2g Q12H- next dose @ 0600     Temp (24hrs), Avg:98.3 F (36.8 C), Min:97.6 F (36.4 C), Max:101 F (38.3 C)  Recent Labs  Lab 11/08/19 0755 11/08/19 1642  WBC 1.5* 0.2*  CREATININE 1.60* 1.48*  LATICACIDVEN  --  3.4*    Estimated Creatinine Clearance: 34.1 mL/min (A) (by C-G formula based on SCr of 1.48 mg/dL (H)).    No Known Allergies  Antimicrobials this admission: .    Microbiology results: BCx: UCx:  Sputum:   MRSA PCR:   Thank you for allowing pharmacy to be a part of this patient's care.  Rowland Lathe 11/08/2019 6:28 PM

## 2019-11-08 NOTE — Progress Notes (Signed)
Communicated to bedside RN Harrell Gave who confirms 2nd LA was sent

## 2019-11-08 NOTE — H&P (Signed)
History and Physical    Frank Mcdaniel [id number] DOB: 20-May-1933 DOA: 11/08/2019  PCP: [name], MD  Patient coming from: infusion chemo center   Chief Complaint: shaking and weakness  HPI: Frank Mcdaniel is a 86 y.o. male with medical history significant of recurrent hairy cell leukemia, asthma, arthritis, chronic kidney disease stage III, was at the chemo infusion center receiving Rituxan.  When he was leaving the son and the nurse noticed patient was shaking and weak.  Son who right me with a history stated at that time they checked his temperature and he was febrile the number is unknown.  He was brought straight to the ED for further evaluation.  ED Course: In the ED his temp is 101, blood pressure currently 134/69, 107.  Respiratory rate 28.  Per ER attending he was found to be hypoxic which improved to the 90s with 2 L.  Rapid Covid was negative.  The other test for SARS Covid is pending.  On cefepime.  X-ray was unremarkable for any acute cardiopulmonary disease.  The chest was negative for PE, please see full results.  Creatinine is 1.48, BUN 20 bicarb 23 lactic acid 3.4 troponin XIX procalcitonin 1.90 WBC 0.2 hemoglobin 11.4 hematocrit 34.1 platelets 24 Oncology Dr. [name] has been made aware of the patient's admission.  Review of Systems: All systems reviewed and otherwise negative.    Past Medical History:  Diagnosis Date  . Anemia 09/01/2015  . Anxiety 09/11/2015  . Arthritis   . Asthma    as a teenager  . Asymptomatic Sinus Bradycardia   . Atypical chest pain    a. 08/2014  . BPH (benign prostatic hyperplasia)   . CKD (chronic kidney disease), stage III   . Closed fracture of tuft of distal phalanx of finger 08/31/2018  . Coronary artery disease    a. 2000 s/p CABG;  b. 10/2008 Neg MV, EF 64%; c. cath 05/2015: LM 85% sev cal, pLAD-1 lesion 80%, pLAD-2 lesion 100% chronic, ostLCx 70% sev cal, OM4 90%, ostRCA 70%, mid RCA 90%, LIMA-LAD patent, VG-OM2  patent, VG-OM3 patent, VG-RPDA patent. No AS  . Dementia without behavioral disturbance ([place]) 05/28/2019  . GERD (gastroesophageal reflux disease)   . H/O echocardiogram    a. 10/2005 Echo: nl EF.  [name] Hairy cell leukemia ([place])   . HOH (hard of hearing)    Bilateral hearing aids  . HTN (hypertension)   . Hyperlipidemia   . Hypertension   . Leukemia ([place])   . Stricture and stenosis of esophagus     Past Surgical History:  Procedure Laterality Date  . CARDIAC CATHETERIZATION N/A 05/26/2015   Procedure: Left Heart Cath;  Surgeon: [name], MD;  Location: [place] [facility];  Service: Cardiovascular;  Laterality: N/A;  . CARDIAC CATHETERIZATION N/A 05/26/2015   Procedure: Coronary/Graft Angiography;  Surgeon: [name], MD;  Location: [place] [facility];  Service: Cardiovascular;  Laterality: N/A;  . CATARACT EXTRACTION    . CORONARY ARTERY BYPASS GRAFT  2000  . ESOPHAGOGASTRODUODENOSCOPY N/A 02/18/2017   Procedure: ESOPHAGOGASTRODUODENOSCOPY (EGD) with removal of food bolus;  Surgeon: [name], MD;  Location: [facility] ENDOSCOPY;  Service: Endoscopy;  Laterality: N/A;  . ESOPHAGOGASTRODUODENOSCOPY (EGD) WITH PROPOFOL N/A 03/11/2017   Procedure: ESOPHAGOGASTRODUODENOSCOPY (EGD) WITH PROPOFOL;  Surgeon: [name], MD;  Location: [facility] ENDOSCOPY;  Service: Endoscopy;  Laterality: N/A;  . ESOPHAGOGASTRODUODENOSCOPY (EGD) WITH PROPOFOL N/A 04/15/2017   Procedure: ESOPHAGOGASTRODUODENOSCOPY (EGD) WITH PROPOFOL;  Surgeon: [name],  MD;  Location: [facility] ENDOSCOPY;  Service: Endoscopy;  Laterality: N/A;  . EYE SURGERY Left    Cataract Extraction with IOL  . INGUINAL HERNIA REPAIR Left 06/24/2016   Procedure: HERNIA REPAIR INGUINAL ADULT;  Surgeon: [name], MD;  Location: [facility] ORS;  Service: General;  Laterality: Left;  . TONSILLECTOMY  1942   . UPPER GASTROINTESTINAL ENDOSCOPY  3 years ago    with Dilation.      reports that he has never smoked. He has never used  smokeless tobacco. He reports that he does not drink alcohol or use drugs.  No Known Allergies  Family History  Problem Relation Age of Onset  . Hypertension Mother   . Arthritis Father   . Other Other        no premature CAD.  [name] Colon cancer Neg Hx   . Prostate cancer Neg Hx   . Stomach cancer Neg Hx      Prior to Admission medications   Medication Sig Start Date End Date Taking? Authorizing Provider  aspirin 81 MG EC tablet Take 2 tablets (162 mg total) by mouth daily. 11/08/10  Yes Gollan, Kathlene November, MD  atorvastatin (LIPITOR) 40 MG tablet TAKE 1/2 TABLETS (20 MG TOTAL) BY MOUTH DAILY. 10/26/19  Yes [name], MD  donepezil (ARICEPT) 10 MG tablet Take 10 mg by mouth at bedtime.   Yes [provider]  isosorbide mononitrate (IMDUR) 30 MG 24 hr tablet TAKE 1 TABLET BY MOUTH EVERY DAY 08/31/19  Yes Gollan, Kathlene November, MD  Multiple Vitamin (MULTIVITAMIN) tablet Take 1 tablet by mouth daily.     Yes [provider]  tamsulosin (FLOMAX) 0.4 MG CAPS capsule TAKE 1 CAPSULES (0.4 MG TOTAL) BY MOUTH DAILY. 09/01/19  Yes [name], MD  Calcium Carbonate Antacid (TUMS CHEWY BITES PO) Take 1 tablet by mouth as needed.     [provider]  CLINPRO 5000 1.1 % PSTE APPLY PEASIZED AMOUNT, BRUSH DAILY AT LEAST 2 MINUTES. SPIT AFTER USE 04/11/19   [provider]  ondansetron (ZOFRAN) 8 MG tablet Take 1 tablet (8 mg total) by mouth 2 (two) times daily as needed (Nausea or vomiting). 10/18/19   [name], MD  polyethylene glycol (MIRALAX / Floria Raveling) packet Take 17 g by mouth daily as needed.     [provider]  prochlorperazine (COMPAZINE) 10 MG tablet Take 1 tablet (10 mg total) by mouth every 6 (six) hours as needed (Nausea or vomiting). 10/18/19   [name], MD    Physical Exam: Vitals:   11/08/19 1632 11/08/19 1633 11/08/19 1723 11/08/19 1730  BP:  (!) 161/100 (!) 143/76 134/69  Pulse:  (!) 154 (!) 113 (!) 107  Resp:   (!) 26 (!) 30 (!) 28  Temp:  (!) 101 F (38.3 C)    TempSrc:  Oral    SpO2:  100% 97% 99%  Weight: 76.4 kg       Constitutional: NAD, calm, comfortable Vitals:   11/08/19 1632 11/08/19 1633 11/08/19 1723 11/08/19 1730  BP:  (!) 161/100 (!) 143/76 134/69  Pulse:  (!) 154 (!) 113 (!) 107  Resp:  (!) 26 (!) 30 (!) 28  Temp:  (!) 101 F (38.3 C)    TempSrc:  Oral    SpO2:  100% 97% 99%  Weight: 76.4 kg      Eyes: PERRL, lids and conjunctivae normal HEENT: mask on Neck: normal, supple Respiratory: clear to auscultation bilaterally, no  wheezing, no crackles. Normal respiratory effort. No accessory muscle use.  Cardiovascular: Regular rate , mildly tachy. no murmurs / rubs / gallops. No extremity edema.  Abdomen: no tenderness, no masses palpated. No hepatosplenomegaly. Bowel sounds positive.  Musculoskeletal: no clubbing / cyanosis. Mild edema L>R Skin: 5 Neurologic: CN 2-12 grossly intact.  Psychiatric: Normal judgment and insight. Alert and oriented x 3. Normal mood.    Labs on Admission: I have personally reviewed following labs and imaging studies  CBC: Recent Labs  Lab 11/08/19 0755 11/08/19 1642  WBC 1.5* 0.2*  NEUTROABS 1.0* 0.1*  HGB 10.4* 11.4*  HCT 30.7* 34.1*  MCV 100.0 100.6*  PLT 77* 24*   Basic Metabolic Panel: Recent Labs  Lab 11/08/19 0755 11/08/19 1642  NA 142 142  K 3.8 3.7  CL 110 110  CO2 25 23  GLUCOSE 113* 106*  BUN 18 20  CREATININE 1.60* 1.48*  CALCIUM 9.1 9.0   GFR: Estimated Creatinine Clearance: 34.1 mL/min (A) (by C-G formula based on SCr of 1.48 mg/dL (H)). Liver Function Tests: Recent Labs  Lab 11/08/19 1642  AST 62*  ALT 21  ALKPHOS 70  BILITOT 1.1  PROT 6.6  ALBUMIN 4.2   No results for input(s): LIPASE, AMYLASE in the last 168 hours. No results for input(s): AMMONIA in the last 168 hours. Coagulation Profile: Recent Labs  Lab 11/08/19 1642  INR 1.7*   Cardiac Enzymes: No results for input(s): CKTOTAL, CKMB,  CKMBINDEX, TROPONINI in the last 168 hours. BNP (last 3 results) No results for input(s): PROBNP in the last 8760 hours. HbA1C: No results for input(s): HGBA1C in the last 72 hours. CBG: No results for input(s): GLUCAP in the last 168 hours. Lipid Profile: No results for input(s): CHOL, HDL, LDLCALC, TRIG, CHOLHDL, LDLDIRECT in the last 72 hours. Thyroid Function Tests: No results for input(s): TSH, T4TOTAL, FREET4, T3FREE, THYROIDAB in the last 72 hours. Anemia Panel: No results for input(s): VITAMINB12, FOLATE, FERRITIN, TIBC, IRON, RETICCTPCT in the last 72 hours. Urine analysis:    Component Value Date/Time   COLORURINE Yellow 09/01/2014 1142   APPEARANCEUR Clear 07/16/2018 1452   LABSPEC 1.013 09/01/2014 1142   PHURINE 5.0 09/01/2014 1142   GLUCOSEU Negative 07/16/2018 1452   GLUCOSEU Negative 09/01/2014 1142   HGBUR Negative 09/01/2014 1142   BILIRUBINUR Negative 07/16/2018 1452   BILIRUBINUR Negative 09/01/2014 1142   KETONESUR Negative 09/01/2014 1142   PROTEINUR Negative 07/16/2018 1452   PROTEINUR Negative 09/01/2014 1142   UROBILINOGEN 0.2 12/21/2015 1132   NITRITE Negative 07/16/2018 1452   NITRITE Negative 09/01/2014 1142   LEUKOCYTESUR Negative 07/16/2018 1452   LEUKOCYTESUR Negative 09/01/2014 1142    Radiological Exams on Admission: CT Angio Chest PE W and/or Wo Contrast  Result Date: 11/08/2019 CLINICAL DATA:  Hypoxia, fever EXAM: CT ANGIOGRAPHY CHEST WITH CONTRAST TECHNIQUE: Multidetector CT imaging of the chest was performed using the standard protocol during bolus administration of intravenous contrast. Multiplanar CT image reconstructions and MIPs were obtained to evaluate the vascular anatomy. CONTRAST:  73mL OMNIPAQUE IOHEXOL 350 MG/ML SOLN COMPARISON:  01/13/2015 FINDINGS: Cardiovascular: No filling defects in the pulmonary arteries to suggest pulmonary emboli. Heart is normal size. Prior CABG. Aortic atherosclerosis. No aneurysm. Mediastinum/Nodes: No  mediastinal, hilar, or axillary adenopathy. Small hiatal hernia. Esophagus mildly distended with gas and fluid may be related to reflux. Distal esophageal wall appears mildly thickened, possibly related to reflux esophagitis. Lungs/Pleura: Lungs are clear. No focal airspace opacities or suspicious nodules. No effusions. Upper  Abdomen: Gallstone noted with fluid in the gallbladder, stable. No acute findings. Musculoskeletal: Chest wall soft tissues are unremarkable. No acute bony abnormality. Review of the MIP images confirms the above findings. IMPRESSION: No evidence of pulmonary embolus. Prior CABG. Small hiatal hernia. Distal esophageal wall thickening and mild distention of the esophagus with air and fluid. Findings may be related to reflux. Aortic Atherosclerosis (ICD10-I70.0). Electronically Signed   By: Rolm Baptise M.D.   On: 11/08/2019 17:46   DG Chest Port 1 View  Result Date: 11/08/2019 CLINICAL DATA:  Shortness of breath EXAM: PORTABLE CHEST 1 VIEW COMPARISON:  04/10/2016 FINDINGS: Prior CABG. Heart is normal size. Low lung volumes. No confluent opacities or effusions. No acute bony abnormality. IMPRESSION: Low lung volumes.  No acute cardiopulmonary disease. Electronically Signed   By: Rolm Baptise M.D.   On: 11/08/2019 17:24    EKG: unable to open image, will need to review paper ekg  Assessment/Plan Active Problems:   Neutropenic fever ([place])    1.Neutropeninc fever- etiology unclear. S/p Rotuxin infusion today Blood cultures obtained Urine culture obtained Will cover empirically with IV vancomycin and cefepime Covid testing pending, rapid was negative Airborne and contact precautions Oncology has made aware will follow up Will admit to stepdown unit IV fluids gentle for hydration  2.  Recurrent hairy cell leukemia-status post Rituxan infusion today Platelets and WBCs have dropped-mildly quickly post infusion. Need to monitor closely  3. HTN- hold meds for now as Unsure if  bp will drop   4.CAD- hold asa due to low plt. Continue statin Hold imdur  5.BPH- continue flomax   6.ALzheimers- on Aricept   DVT prophylaxis: scd Code Status: full-confirmed by son and the patient Family Communication: Son at bedside updated Disposition Plan: Home when medically stable Consults called: Oncology Admission status: Patient as he requires more than 2 midnight stays   Nolberto Hanlon MD [facility] Pager [phone]   If 7PM-7AM, please contact night-coverage [email or web address] Password [facility]  11/08/2019, 6:14 PM

## 2019-11-08 NOTE — Progress Notes (Signed)
CODE SEPSIS - PHARMACY COMMUNICATION  **Broad Spectrum Antibiotics should be administered within 1 hour of Sepsis diagnosis**  Time Code Sepsis Called/Page Received: @1744   Antibiotics Ordered: Vancomycin/ Cefepime/ Metronidazole   Time of 1st antibiotic administration: cefepime @1753      Rowland Lathe ,PharmD Clinical Pharmacist  11/08/2019  5:46 PM

## 2019-11-09 ENCOUNTER — Encounter: Payer: Self-pay | Admitting: Internal Medicine

## 2019-11-09 ENCOUNTER — Telehealth: Payer: Self-pay | Admitting: Family Medicine

## 2019-11-09 ENCOUNTER — Inpatient Hospital Stay: Payer: PPO

## 2019-11-09 DIAGNOSIS — R0602 Shortness of breath: Secondary | ICD-10-CM

## 2019-11-09 DIAGNOSIS — D72819 Decreased white blood cell count, unspecified: Secondary | ICD-10-CM

## 2019-11-09 DIAGNOSIS — I129 Hypertensive chronic kidney disease with stage 1 through stage 4 chronic kidney disease, or unspecified chronic kidney disease: Secondary | ICD-10-CM

## 2019-11-09 DIAGNOSIS — E785 Hyperlipidemia, unspecified: Secondary | ICD-10-CM

## 2019-11-09 DIAGNOSIS — Z9225 Personal history of immunosupression therapy: Secondary | ICD-10-CM

## 2019-11-09 DIAGNOSIS — N183 Chronic kidney disease, stage 3 unspecified: Secondary | ICD-10-CM

## 2019-11-09 LAB — BLOOD CULTURE ID PANEL (REFLEXED)

## 2019-11-09 LAB — BASIC METABOLIC PANEL
Anion gap: 8 (ref 5–15)
BUN: 20 mg/dL (ref 8–23)
CO2: 21 mmol/L — ABNORMAL LOW (ref 22–32)
Calcium: 8.5 mg/dL — ABNORMAL LOW (ref 8.9–10.3)
Chloride: 111 mmol/L (ref 98–111)
Creatinine, Ser: 1.4 mg/dL — ABNORMAL HIGH (ref 0.61–1.24)
GFR calc Af Amer: 53 mL/min — ABNORMAL LOW (ref >60)
GFR calc non Af Amer: 45 mL/min — ABNORMAL LOW (ref >60)
Glucose, Bld: 141 mg/dL — ABNORMAL HIGH (ref 70–99)
Potassium: 4 mmol/L (ref 3.5–5.1)
Sodium: 140 mmol/L (ref 135–145)

## 2019-11-09 LAB — CBC
HCT: 29.4 % — ABNORMAL LOW (ref 39.0–52.0)
Hemoglobin: 10 g/dL — ABNORMAL LOW (ref 13.0–17.0)
MCH: 33.8 pg (ref 26.0–34.0)
MCHC: 34 g/dL (ref 30.0–36.0)
MCV: 99.3 fL (ref 80.0–100.0)
Platelets: 22 10*3/uL — CL (ref 150–400)
RBC: 2.96 MIL/uL — ABNORMAL LOW (ref 4.22–5.81)
RDW: 15.4 % (ref 11.5–15.5)
WBC: 3.4 10*3/uL — ABNORMAL LOW (ref 4.0–10.5)
nRBC: 0 % (ref 0.0–0.2)

## 2019-11-09 LAB — GLUCOSE, CAPILLARY: Glucose-Capillary: 137 mg/dL — ABNORMAL HIGH (ref 70–99)

## 2019-11-09 LAB — MRSA PCR SCREENING: MRSA by PCR: NEGATIVE

## 2019-11-09 MED ORDER — LORAZEPAM 2 MG/ML IJ SOLN
1.0000 mg | Freq: Once | INTRAMUSCULAR | Status: AC
  Administered 2019-11-09: 1 mg via INTRAVENOUS
  Filled 2019-11-09: qty 1

## 2019-11-09 MED ORDER — SODIUM CHLORIDE 0.9 % IV SOLN: INTRAVENOUS | Status: DC | PRN

## 2019-11-09 MED ORDER — DEXAMETHASONE 4 MG PO TABS
4.0000 mg | ORAL_TABLET | Freq: Two times a day (BID) | ORAL | Status: DC
  Administered 2019-11-09 – 2019-11-12 (×6): 4 mg via ORAL
  Filled 2019-11-09 (×9): qty 1

## 2019-11-09 MED ORDER — FAMOTIDINE 20 MG PO TABS
20.0000 mg | ORAL_TABLET | Freq: Every day | ORAL | Status: DC
  Administered 2019-11-10 – 2019-11-12 (×3): 20 mg via ORAL
  Filled 2019-11-09 (×3): qty 1

## 2019-11-09 MED ORDER — CALCIUM CARBONATE ANTACID 500 MG PO CHEW
1.0000 | CHEWABLE_TABLET | Freq: Two times a day (BID) | ORAL | Status: DC
  Administered 2019-11-09 – 2019-11-12 (×5): 200 mg via ORAL
  Filled 2019-11-09 (×6): qty 1

## 2019-11-09 MED ORDER — ORAL CARE MOUTH RINSE
15.0000 mL | Freq: Two times a day (BID) | OROMUCOSAL | Status: DC
  Administered 2019-11-10 – 2019-11-12 (×5): 15 mL via OROMUCOSAL

## 2019-11-09 MED ORDER — CHLORHEXIDINE GLUCONATE CLOTH 2 % EX PADS
6.0000 | MEDICATED_PAD | Freq: Every day | CUTANEOUS | Status: DC
  Administered 2019-11-09 – 2019-11-12 (×4): 6 via TOPICAL

## 2019-11-09 NOTE — Telephone Encounter (Signed)
Pt's son came in and asked to speak to Dr. Caryl Bis or nurse. He wanted to ask if PCP could prescribe pt something for his nerves. Pt is in the hospital. Please call son back and if he does not answer, please leave detailed message. [phone]  Charday Capetillo,cma

## 2019-11-09 NOTE — Telephone Encounter (Signed)
Pt's son came in and asked to speak to Dr. Caryl Bis or nurse. He wanted to ask if PCP could prescribe pt something for his nerves. Pt is in the hospital. Please call son back and if he does not answer, please leave detailed message. [phone]

## 2019-11-09 NOTE — ED Notes (Signed)
Pt sat up and provided breakfast tray. Son at bedside at this time.

## 2019-11-09 NOTE — ED Notes (Signed)
Attempted to call ICU. Receiving RN stated she was in a pt room and couldn't talk at this moment, asked to call back in 5 minutes.

## 2019-11-09 NOTE — Progress Notes (Signed)
Report received from Katie, RN

## 2019-11-09 NOTE — ED Notes (Signed)
Pt assisted with repositioning in bed at this time. HOB lowered and pt slid up in bed.

## 2019-11-09 NOTE — ED Notes (Signed)
Attempted to call ICU, was told receiving RN was busy.

## 2019-11-09 NOTE — ED Notes (Signed)
Attempted to call ICU. They told this RN they couldn't take the pt at this time. ED Charge RN made aware.

## 2019-11-09 NOTE — Progress Notes (Signed)
PROGRESS NOTE    Frank Mcdaniel  [id number] DOB: 10/31/1932 DOA: 11/08/2019 PCP: [name], MD    Brief Narrative:  Frank Mcdaniel is a 86 y.o. male with medical history significant of recurrent hairy cell leukemia, asthma, arthritis, chronic kidney disease stage III, was at the chemo infusion center receiving Rituxan.  When he was leaving the son and the nurse noticed patient was shaking and weak.  Son who right me with a history stated at that time they checked his temperature and he was febrile the number is unknown.  He was brought straight to the ED for further evaluation.. in ED he was febrile, pancytopenic, found in DIC    Consultants:   Oncology  Procedures:   Antimicrobials:   vanco and cefepime    Subjective: Has no complaints today.  He is actually more with it today.  Denies any shortness of breath, chest pain or fever or chills.  Objective: Vitals:   11/09/19 1037 11/09/19 1100 11/09/19 1200 11/09/19 1300  BP:  120/74 (!) 143/68 (!) 124/59  Pulse:  (!) 52 (!) 53 61  Resp:  (!) 23 (!) 23 (!) 24  Temp:      TempSrc:      SpO2:  100% 100% 99%  Weight:      Height: 5\' 9"  (1.753 m)       Intake/Output Summary (Last 24 hours) at 11/09/2019 1508 Last data filed at 11/09/2019 0827 Gross per 24 hour  Intake 1296.31 ml  Output --  Net 1296.31 ml   Filed Weights   11/08/19 1632  Weight: 76.4 kg    Examination:  General exam: Appears calm and comfortable, NAD Respiratory system: Clear to auscultation. Respiratory effort normal. Cardiovascular system: S1 & S2 heard, RRR. No JVD, murmurs, rubs, gallops or clicks.  Gastrointestinal system: Abdomen is nondistended, soft and nontender. Normal bowel sounds heard. Central nervous system: Alert and oriented x3.  Grossly intact Extremities: No edema Skin: Dry Psychiatry:Mood & affect appropriate in current setting.     Data Reviewed: I have personally reviewed following labs and imaging  studies  CBC: Recent Labs  Lab 11/08/19 0755 11/08/19 1642 11/09/19 0441  WBC 1.5* 0.2* 3.4*  NEUTROABS 1.0* 0.1*  --   HGB 10.4* 11.4* 10.0*  HCT 30.7* 34.1* 29.4*  MCV 100.0 100.6* 99.3  PLT 77* 24* 22*   Basic Metabolic Panel: Recent Labs  Lab 11/08/19 0755 11/08/19 1642 11/09/19 0441  NA 142 142 140  K 3.8 3.7 4.0  CL 110 110 111  CO2 25 23 21*  GLUCOSE 113* 106* 141*  BUN 18 20 20   CREATININE 1.60* 1.48* 1.40*  CALCIUM 9.1 9.0 8.5*   GFR: Estimated Creatinine Clearance: 38.6 mL/min (A) (by C-G formula based on SCr of 1.4 mg/dL (H)). Liver Function Tests: Recent Labs  Lab 11/08/19 1642  AST 62*  ALT 21  ALKPHOS 70  BILITOT 1.1  PROT 6.6  ALBUMIN 4.2   No results for input(s): LIPASE, AMYLASE in the last 168 hours. No results for input(s): AMMONIA in the last 168 hours. Coagulation Profile: Recent Labs  Lab 11/08/19 1642  INR 1.7*   Cardiac Enzymes: No results for input(s): CKTOTAL, CKMB, CKMBINDEX, TROPONINI in the last 168 hours. BNP (last 3 results) No results for input(s): PROBNP in the last 8760 hours. HbA1C: No results for input(s): HGBA1C in the last 72 hours. CBG: Recent Labs  Lab 11/09/19 0949  GLUCAP 137*   Lipid Profile: No results for  input(s): CHOL, HDL, LDLCALC, TRIG, CHOLHDL, LDLDIRECT in the last 72 hours. Thyroid Function Tests: No results for input(s): TSH, T4TOTAL, FREET4, T3FREE, THYROIDAB in the last 72 hours. Anemia Panel: No results for input(s): VITAMINB12, FOLATE, FERRITIN, TIBC, IRON, RETICCTPCT in the last 72 hours. Sepsis Labs: Recent Labs  Lab 11/08/19 1642 11/08/19 2326  PROCALCITON 1.90  --   LATICACIDVEN 3.4* 1.7    Recent Results (from the past 240 hour(s))  Blood Culture (routine x 2)     Status: None (Preliminary result)   Collection Time: 11/08/19  4:43 PM   Specimen: BLOOD  Result Value Ref Range Status   Specimen Description BLOOD LAC  Final   Special Requests   Final    BOTTLES DRAWN AEROBIC  AND ANAEROBIC Blood Culture results may not be optimal due to an excessive volume of blood received in culture bottles   Culture  Setup Time   Final    Organism ID to follow [place] CRITICAL RESULT CALLED TO, READ BACK BY AND VERIFIED WITH: Rito Ehrlich 11/09/19 @ [place] Performed at [facility], [address] [place], [place] [address]    Culture GRAM POSITIVE COCCI  Final   Report Status PENDING  Incomplete  Blood Culture ID Panel (Reflexed)     Status: Abnormal   Collection Time: 11/08/19  4:43 PM  Result Value Ref Range Status   Enterococcus species NOT DETECTED NOT DETECTED Final   Listeria monocytogenes NOT DETECTED NOT DETECTED Final   Staphylococcus species DETECTED (A) NOT DETECTED Final    Comment: Methicillin (oxacillin) susceptible coagulase negative staphylococcus. Possible blood culture contaminant (unless isolated from more than one blood culture draw or clinical case suggests pathogenicity). No antibiotic treatment is indicated for blood  culture contaminants. CRITICAL RESULT CALLED TO, READ BACK BY AND VERIFIED WITH: WALID NAZARI 11/09/19 @ [id number]  [place]    Staphylococcus aureus (BCID) NOT DETECTED NOT DETECTED Final   Methicillin resistance NOT DETECTED NOT DETECTED Final   Streptococcus species NOT DETECTED NOT DETECTED Final   Streptococcus agalactiae NOT DETECTED NOT DETECTED Final   Streptococcus pneumoniae NOT DETECTED NOT DETECTED Final   Streptococcus pyogenes NOT DETECTED NOT DETECTED Final   Acinetobacter baumannii NOT DETECTED NOT DETECTED Final   Enterobacteriaceae species NOT DETECTED NOT DETECTED Final   Enterobacter cloacae complex NOT DETECTED NOT DETECTED Final   Escherichia coli NOT DETECTED NOT DETECTED Final   Klebsiella oxytoca NOT DETECTED NOT DETECTED Final   Klebsiella pneumoniae NOT DETECTED NOT DETECTED Final   Proteus species NOT DETECTED NOT DETECTED Final   Serratia marcescens NOT DETECTED NOT  DETECTED Final   Haemophilus influenzae NOT DETECTED NOT DETECTED Final   Neisseria meningitidis NOT DETECTED NOT DETECTED Final   Pseudomonas aeruginosa NOT DETECTED NOT DETECTED Final   Candida albicans NOT DETECTED NOT DETECTED Final   Candida glabrata NOT DETECTED NOT DETECTED Final   Candida krusei NOT DETECTED NOT DETECTED Final   Candida parapsilosis NOT DETECTED NOT DETECTED Final   Candida tropicalis NOT DETECTED NOT DETECTED Final    Comment: Performed at [facility], [place], [place] [address]  SARS CORONAVIRUS 2 (TAT 6-24 HRS) Nasopharyngeal Nasopharyngeal Swab     Status: None   Collection Time: 11/08/19  6:00 PM   Specimen: Nasopharyngeal Swab  Result Value Ref Range Status   SARS Coronavirus 2 NEGATIVE NEGATIVE Final    Comment: (NOTE) SARS-CoV-2 target nucleic acids are NOT DETECTED. The SARS-CoV-2  RNA is generally detectable in upper and lower respiratory specimens during the acute phase of infection. Negative results do not preclude SARS-CoV-2 infection, do not rule out co-infections with other pathogens, and should not be used as the sole basis for treatment or other patient management decisions. Negative results must be combined with clinical observations, patient history, and epidemiological information. The expected result is Negative. Fact Sheet for Patients: SugarRoll.be Fact Sheet for Healthcare Providers: https://www.woods-mathews.com/ This test is not yet approved or cleared by the [place] FDA and  has been authorized for detection and/or diagnosis of SARS-CoV-2 by FDA under an Emergency Use Authorization (EUA). This EUA will remain  in effect (meaning this test can be used) for the duration of the COVID-19 declaration under Section 56 4(b)(1) of the Act, 21 U.S.C. section 360bbb-3(b)(1), unless the authorization is terminated or revoked sooner. Performed at [facility],  [place] [address] [place], [place] [address]   MRSA PCR Screening     Status: None   Collection Time: 11/09/19  9:41 AM   Specimen: Nasal Mucosa; Nasopharyngeal  Result Value Ref Range Status   MRSA by PCR NEGATIVE NEGATIVE Final    Comment:        The GeneXpert MRSA Assay (FDA approved for NASAL specimens only), is one component of a comprehensive MRSA colonization surveillance program. It is not intended to diagnose MRSA infection nor to guide or monitor treatment for MRSA infections. Performed at [facility], [address] [place], [place] [address]          Radiology Studies: CT Angio Chest PE W and/or Wo Contrast  Result Date: 11/08/2019 CLINICAL DATA:  Hypoxia, fever EXAM: CT ANGIOGRAPHY CHEST WITH CONTRAST TECHNIQUE: Multidetector CT imaging of the chest was performed using the standard protocol during bolus administration of intravenous contrast. Multiplanar CT image reconstructions and MIPs were obtained to evaluate the vascular anatomy. CONTRAST:  58mL OMNIPAQUE IOHEXOL 350 MG/ML SOLN COMPARISON:  01/13/2015 FINDINGS: Cardiovascular: No filling defects in the pulmonary arteries to suggest pulmonary emboli. Heart is normal size. Prior CABG. Aortic atherosclerosis. No aneurysm. Mediastinum/Nodes: No mediastinal, hilar, or axillary adenopathy. Small hiatal hernia. Esophagus mildly distended with gas and fluid may be related to reflux. Distal esophageal wall appears mildly thickened, possibly related to reflux esophagitis. Lungs/Pleura: Lungs are clear. No focal airspace opacities or suspicious nodules. No effusions. Upper Abdomen: Gallstone noted with fluid in the gallbladder, stable. No acute findings. Musculoskeletal: Chest wall soft tissues are unremarkable. No acute bony abnormality. Review of the MIP images confirms the above findings. IMPRESSION: No evidence of pulmonary embolus. Prior CABG. Small hiatal hernia. Distal esophageal wall thickening and mild distention of  the esophagus with air and fluid. Findings may be related to reflux. Aortic Atherosclerosis (ICD10-I70.0). Electronically Signed   By: Rolm Baptise M.D.   On: 11/08/2019 17:46   DG Chest Port 1 View  Result Date: 11/08/2019 CLINICAL DATA:  Shortness of breath EXAM: PORTABLE CHEST 1 VIEW COMPARISON:  04/10/2016 FINDINGS: Prior CABG. Heart is normal size. Low lung volumes. No confluent opacities or effusions. No acute bony abnormality. IMPRESSION: Low lung volumes.  No acute cardiopulmonary disease. Electronically Signed   By: Rolm Baptise M.D.   On: 11/08/2019 17:24        Scheduled Meds: . Chlorhexidine Gluconate Cloth  6 each Topical Daily  . dexamethasone  4 mg Oral Q12H  . donepezil  10 mg Oral QHS  . mouth rinse  15 mL Mouth Rinse BID  .  multivitamin with minerals  1 tablet Oral Daily  . tamsulosin  0.4 mg Oral QPC breakfast   Continuous Infusions: . sodium chloride 50 mL/hr at 11/09/19 0827  . sodium chloride    . ceFEPime (MAXIPIME) IV Stopped (11/09/19 0552)  . [START ON 11/10/2019] vancomycin      Assessment & Plan:   Active Problems:   Neutropenic fever ([place])   1.Neutropeninc fever- etiology unclear. S/p Rotuxin infusion on 4/5...>transfusion reaction? Infection?  Blood cultures positive for Staphylococcus final organism and sensitivity pending Urine culture pending Continue with empirically with IV vancomycin and cefepime Covid testing negative , Oncology following-unclear if decompensation was related to transfusion reaction or other etiology.  Will hold treatment until etiology can be determined. Oncology started Decadron 4 mg twice daily. Will follow up back in cancer center for further evaluation upon discharge CT negative for PE. On 3 L , will give incentive spirometer and wean off 02.   2.  Recurrent hairy cell leukemia-status post Rituxan infusion today Platelets and WBCs have dropped-mildly quickly post infusion. Acute drop in platelets 2/2 to DIC. Wbc  improved after given steroid on admission  3. HTN- hold meds for now as Unsure if bp will drop   4.CAD- hold asa due to low plt. Continue statin Hold imdur  5.BPH- continue flomax   6.ALzheimers- on Aricept  DVT prophylaxis: scd Code Status: full-confirmed by son and the patient Family Communication: Son at bedside updated Disposition Plan: Home when medically stable Barrier: Needs to be afebrile for 24hours, is in DIC. If stable can d/c in am       LOS: 1 day   Time spent: 45 minutes with more than 50% on [place], MD [facility] Pager [phone]  If 7PM-7AM, please contact night-coverage [email or web address] Password [facility] 11/09/2019, 3:08 PM

## 2019-11-09 NOTE — Telephone Encounter (Signed)
We can discuss treatment for anxiety once he is discharged. If he is anxious in the hospital he should discuss with the physician there.

## 2019-11-09 NOTE — Consult Note (Signed)
[place]  Telephone:([phone] [phone]  ID: MONTAVIS PRALL OB: Mar 22, 1933  MR#: [id number]  [id number]  Patient Care Team: [name], MD as PCP - General (Family Medicine) Rockey Situ Kathlene November, MD as Consulting Physician (Cardiology) [name], MD as Consulting Physician (Oncology) [name] as Physician Assistant (Urology)  CHIEF COMPLAINT: Hairy cell leukemia, shortness of breath, DIC  INTERVAL HISTORY: Patient is an 86 year old male who reinitiated treatment for recurrent hairy cell leukemia yesterday with Rituxan and cladribine.  Shortly after his infusion outside the cancer center, patient had a decompensation episode where his oxygen saturations decreased to 70%, he spiked a fever and became diaphoretic.  He was subsequently transported to the emergency room for admission.  Currently, he feels significantly improved and nearly back to his baseline.  He has minimal recollection of the events of yesterday.  He has no neurologic complaints.  He has a good appetite.  He denies any further shortness of breath.  He has no cough, chest pain, hemoptysis.  He denies any nausea, vomiting, constipation, or diarrhea.  He has no urinary complaints.  Patient offers no specific complaints today.  REVIEW OF SYSTEMS:   Review of Systems  Constitutional: Negative.  Negative for chills, diaphoresis, fever, malaise/fatigue and weight loss.  Respiratory: Negative.  Negative for cough and shortness of breath.   Cardiovascular: Negative.  Negative for chest pain and leg swelling.  Gastrointestinal: Negative.  Negative for abdominal pain.  Genitourinary: Negative.  Negative for dysuria and hematuria.  Musculoskeletal: Negative.  Negative for back pain.  Skin: Negative.  Negative for rash.  Neurological: Negative.  Negative for dizziness, focal weakness, weakness and headaches.  Psychiatric/Behavioral: Negative.  The patient is not  nervous/anxious.     As per HPI. Otherwise, a complete review of systems is negative.  PAST MEDICAL HISTORY: Past Medical History:  Diagnosis Date  . Anemia 09/01/2015  . Anxiety 09/11/2015  . Arthritis   . Asthma    as a teenager  . Asymptomatic Sinus Bradycardia   . Atypical chest pain    a. 08/2014  . BPH (benign prostatic hyperplasia)   . CKD (chronic kidney disease), stage III   . Closed fracture of tuft of distal phalanx of finger 08/31/2018  . Coronary artery disease    a. 2000 s/p CABG;  b. 10/2008 Neg MV, EF 64%; c. cath 05/2015: LM 85% sev cal, pLAD-1 lesion 80%, pLAD-2 lesion 100% chronic, ostLCx 70% sev cal, OM4 90%, ostRCA 70%, mid RCA 90%, LIMA-LAD patent, VG-OM2 patent, VG-OM3 patent, VG-RPDA patent. No AS  . Dementia without behavioral disturbance ([place]) 05/28/2019  . GERD (gastroesophageal reflux disease)   . H/O echocardiogram    a. 10/2005 Echo: nl EF.  [name] Hairy cell leukemia ([place])   . HOH (hard of hearing)    Bilateral hearing aids  . HTN (hypertension)   . Hyperlipidemia   . Hypertension   . Leukemia ([place])   . Stricture and stenosis of esophagus     PAST SURGICAL HISTORY: Past Surgical History:  Procedure Laterality Date  . CARDIAC CATHETERIZATION N/A 05/26/2015   Procedure: Left Heart Cath;  Surgeon: [name], MD;  Location: [facility];  Service: Cardiovascular;  Laterality: N/A;  . CARDIAC CATHETERIZATION N/A 05/26/2015   Procedure: Coronary/Graft Angiography;  Surgeon: [name], MD;  Location: [facility];  Service: Cardiovascular;  Laterality: N/A;  . CATARACT EXTRACTION    . CORONARY ARTERY BYPASS GRAFT  2000  . ESOPHAGOGASTRODUODENOSCOPY N/A 02/18/2017   Procedure: ESOPHAGOGASTRODUODENOSCOPY (EGD) with removal of food bolus;  Surgeon: [name], MD;  Location: [facility] ENDOSCOPY;  Service: Endoscopy;  Laterality: N/A;  . ESOPHAGOGASTRODUODENOSCOPY (EGD) WITH PROPOFOL N/A 03/11/2017   Procedure: ESOPHAGOGASTRODUODENOSCOPY  (EGD) WITH PROPOFOL;  Surgeon: [name], MD;  Location: [facility] ENDOSCOPY;  Service: Endoscopy;  Laterality: N/A;  . ESOPHAGOGASTRODUODENOSCOPY (EGD) WITH PROPOFOL N/A 04/15/2017   Procedure: ESOPHAGOGASTRODUODENOSCOPY (EGD) WITH PROPOFOL;  Surgeon: [name], MD;  Location: [facility] ENDOSCOPY;  Service: Endoscopy;  Laterality: N/A;  . EYE SURGERY Left    Cataract Extraction with IOL  . INGUINAL HERNIA REPAIR Left 06/24/2016   Procedure: HERNIA REPAIR INGUINAL ADULT;  Surgeon: [name], MD;  Location: [facility] ORS;  Service: General;  Laterality: Left;  . TONSILLECTOMY  1942   . UPPER GASTROINTESTINAL ENDOSCOPY  3 years ago    with Dilation.     FAMILY HISTORY: Family History  Problem Relation Age of Onset  . Hypertension Mother   . Arthritis Father   . Other Other        no premature CAD.  [name] Colon cancer Neg Hx   . Prostate cancer Neg Hx   . Stomach cancer Neg Hx     ADVANCED DIRECTIVES (Y/N):  @ADVDIR @  HEALTH MAINTENANCE: Social History   Tobacco Use  . Smoking status: Never Smoker  . Smokeless tobacco: Never Used  Substance Use Topics  . Alcohol use: No  . Drug use: No     Colonoscopy:  PAP:  Bone density:  Lipid panel:  No Known Allergies  Current Facility-Administered Medications  Medication Dose Route Frequency Provider Last Rate Last Admin  . 0.9 %  sodium chloride infusion   Intravenous Continuous Nolberto Hanlon, MD 50 mL/hr at 11/09/19 0827 Rate Verify at 11/09/19 0827  . 0.9 %  sodium chloride infusion   Intravenous PRN Nolberto Hanlon, MD      . acetaminophen (TYLENOL) tablet 650 mg  650 mg Oral Q6H PRN Nolberto Hanlon, MD       Or  . acetaminophen (TYLENOL) suppository 650 mg  650 mg Rectal Q6H PRN Nolberto Hanlon, MD      . ceFEPIme (MAXIPIME) 2 g in sodium chloride 0.9 % 100 mL IVPB  2 g Intravenous Q12H Rowland Lathe, RPH   Stopped at 11/09/19 [id number]  . Chlorhexidine Gluconate Cloth 2 % PADS 6 each  6 each Topical Daily Nolberto Hanlon, MD   6 each at  11/09/19 1025  . dexamethasone (DECADRON) tablet 4 mg  4 mg Oral Q12H [name], MD      . donepezil (ARICEPT) tablet 10 mg  10 mg Oral QHS Nolberto Hanlon, MD   10 mg at 11/08/19 2150  . MEDLINE mouth rinse  15 mL Mouth Rinse BID Nolberto Hanlon, MD      . multivitamin with minerals tablet 1 tablet  1 tablet Oral Daily Nolberto Hanlon, MD   1 tablet at 11/09/19 0858  . ondansetron (ZOFRAN) tablet 8 mg  8 mg Oral BID PRN Nolberto Hanlon, MD      . polyethylene glycol (MIRALAX / GLYCOLAX) packet 17 g  17 g Oral Daily PRN Nolberto Hanlon, MD      . prochlorperazine (COMPAZINE) tablet 10 mg  10 mg Oral Q6H PRN Nolberto Hanlon, MD      . tamsulosin (FLOMAX) capsule 0.4 mg  0.4 mg Oral QPC breakfast Nolberto Hanlon, MD   0.4 mg at 11/09/19 0858  . [  START ON 11/10/2019] vancomycin (VANCOREADY) IVPB 1250 mg/250 mL  1,250 mg Intravenous Q36H Rowland Lathe, RPH        OBJECTIVE: Vitals:   11/09/19 1200 11/09/19 1300  BP: (!) 143/68 (!) 124/59  Pulse: (!) 53 61  Resp: (!) 23 (!) 24  Temp:    SpO2: 100% 99%     Body mass index is 24.87 kg/m.    ECOG FS:1 - Symptomatic but completely ambulatory  General: Well-developed, well-nourished, no acute distress. Eyes: Pink conjunctiva, anicteric sclera. HEENT: Normocephalic, moist mucous membranes. Lungs: No audible wheezing or coughing. Heart: Regular rate and rhythm. Abdomen: Soft, nontender, no obvious distention. Musculoskeletal: No edema, cyanosis, or clubbing. Neuro: Alert, answering all questions appropriately. Cranial nerves grossly intact. Skin: No rashes or petechiae noted. Psych: Normal affect. Lymphatics: No cervical, calvicular, axillary or inguinal LAD.   LAB RESULTS:  Lab Results  Component Value Date   NA 140 11/09/2019   K 4.0 11/09/2019   CL 111 11/09/2019   CO2 21 (L) 11/09/2019   GLUCOSE 141 (H) 11/09/2019   BUN 20 11/09/2019   CREATININE 1.40 (H) 11/09/2019   CALCIUM 8.5 (L) 11/09/2019   PROT 6.6 11/08/2019   ALBUMIN 4.2  11/08/2019   AST 62 (H) 11/08/2019   ALT 21 11/08/2019   ALKPHOS 70 11/08/2019   BILITOT 1.1 11/08/2019   GFRNONAA 45 (L) 11/09/2019   GFRAA 53 (L) 11/09/2019    Lab Results  Component Value Date   WBC 3.4 (L) 11/09/2019   NEUTROABS 0.1 (L) 11/08/2019   HGB 10.0 (L) 11/09/2019   HCT 29.4 (L) 11/09/2019   MCV 99.3 11/09/2019   PLT 22 (LL) 11/09/2019     STUDIES: CT Angio Chest PE W and/or Wo Contrast  Result Date: 11/08/2019 CLINICAL DATA:  Hypoxia, fever EXAM: CT ANGIOGRAPHY CHEST WITH CONTRAST TECHNIQUE: Multidetector CT imaging of the chest was performed using the standard protocol during bolus administration of intravenous contrast. Multiplanar CT image reconstructions and MIPs were obtained to evaluate the vascular anatomy. CONTRAST:  26mL OMNIPAQUE IOHEXOL 350 MG/ML SOLN COMPARISON:  01/13/2015 FINDINGS: Cardiovascular: No filling defects in the pulmonary arteries to suggest pulmonary emboli. Heart is normal size. Prior CABG. Aortic atherosclerosis. No aneurysm. Mediastinum/Nodes: No mediastinal, hilar, or axillary adenopathy. Small hiatal hernia. Esophagus mildly distended with gas and fluid may be related to reflux. Distal esophageal wall appears mildly thickened, possibly related to reflux esophagitis. Lungs/Pleura: Lungs are clear. No focal airspace opacities or suspicious nodules. No effusions. Upper Abdomen: Gallstone noted with fluid in the gallbladder, stable. No acute findings. Musculoskeletal: Chest wall soft tissues are unremarkable. No acute bony abnormality. Review of the MIP images confirms the above findings. IMPRESSION: No evidence of pulmonary embolus. Prior CABG. Small hiatal hernia. Distal esophageal wall thickening and mild distention of the esophagus with air and fluid. Findings may be related to reflux. Aortic Atherosclerosis (ICD10-I70.0). Electronically Signed   By: Rolm Baptise M.D.   On: 11/08/2019 17:46   DG Chest Port 1 View  Result Date: 11/08/2019 CLINICAL  DATA:  Shortness of breath EXAM: PORTABLE CHEST 1 VIEW COMPARISON:  04/10/2016 FINDINGS: Prior CABG. Heart is normal size. Low lung volumes. No confluent opacities or effusions. No acute bony abnormality. IMPRESSION: Low lung volumes.  No acute cardiopulmonary disease. Electronically Signed   By: Rolm Baptise M.D.   On: 11/08/2019 17:24    ASSESSMENT: Hairy cell leukemia, shortness of breath, DIC  PLAN:    1.  Hairy cell leukemia: Patient noted  to have recurrent disease and reinitiated treatment with Rituxan and cladribine yesterday.  Unclear if his decompensation was related to a transfusion reaction or other etiology.  Case is being further evaluated by oncology pharmacy.  In the meantime, will hold treatment until an etiology can be determined.  Have ordered Decadron 4 mg twice per day.  No intervention is needed at this time.  Will arrange follow-up back in the cancer center for further evaluation upon discharge. 2.  Shortness of breath: Significantly improved.  Patient has 100% O2 sats with 3 L of oxygen while eating lunch today.  Unclear etiology.  CT scan from yesterday revealed no evidence of pulmonary embolus.  Continue Decadron as above. 3.  DIC: Unclear etiology.  No obvious infection noted.  Blood cultures negative to date. 4.  Leukopenia: Likely related to chemotherapy.  Improvement of white blood cell count is most likely secondary to Decadron. 5.  Thrombocytopenia: Multifactorial.  Patient's baseline was approximately 77 prior to initiating treatment.  Acute decrease in platelets was likely secondary to DIC.  Will follow.  [name], MD   11/09/2019 1:26 PM

## 2019-11-10 ENCOUNTER — Inpatient Hospital Stay: Payer: PPO

## 2019-11-10 DIAGNOSIS — D61818 Other pancytopenia: Secondary | ICD-10-CM

## 2019-11-10 LAB — CBC
HCT: 28.3 % — ABNORMAL LOW (ref 39.0–52.0)
Hemoglobin: 9.6 g/dL — ABNORMAL LOW (ref 13.0–17.0)
MCH: 33.4 pg (ref 26.0–34.0)
MCHC: 33.9 g/dL (ref 30.0–36.0)
MCV: 98.6 fL (ref 80.0–100.0)
Platelets: 33 10*3/uL — ABNORMAL LOW (ref 150–400)
RBC: 2.87 MIL/uL — ABNORMAL LOW (ref 4.22–5.81)
RDW: 15.3 % (ref 11.5–15.5)
WBC: 3.2 10*3/uL — ABNORMAL LOW (ref 4.0–10.5)
nRBC: 0 % (ref 0.0–0.2)

## 2019-11-10 LAB — BASIC METABOLIC PANEL
Anion gap: 7 (ref 5–15)
BUN: 29 mg/dL — ABNORMAL HIGH (ref 8–23)
CO2: 23 mmol/L (ref 22–32)
Calcium: 8.8 mg/dL — ABNORMAL LOW (ref 8.9–10.3)
Chloride: 110 mmol/L (ref 98–111)
Creatinine, Ser: 1.31 mg/dL — ABNORMAL HIGH (ref 0.61–1.24)
GFR calc Af Amer: 57 mL/min — ABNORMAL LOW (ref >60)
GFR calc non Af Amer: 49 mL/min — ABNORMAL LOW (ref >60)
Glucose, Bld: 133 mg/dL — ABNORMAL HIGH (ref 70–99)
Potassium: 4 mmol/L (ref 3.5–5.1)
Sodium: 140 mmol/L (ref 135–145)

## 2019-11-10 MED ORDER — LORAZEPAM 2 MG/ML IJ SOLN
0.5000 mg | Freq: Once | INTRAMUSCULAR | Status: AC
  Administered 2019-11-10: 0.5 mg via INTRAVENOUS
  Filled 2019-11-10: qty 1

## 2019-11-10 MED ORDER — CEFAZOLIN SODIUM-DEXTROSE 2-4 GM/100ML-% IV SOLN
2.0000 g | Freq: Three times a day (TID) | INTRAVENOUS | Status: DC
  Administered 2019-11-10 – 2019-11-12 (×6): 2 g via INTRAVENOUS
  Filled 2019-11-10 (×9): qty 100

## 2019-11-10 MED ORDER — LORAZEPAM 2 MG/ML IJ SOLN: 1.0000 mg | Freq: Once | INTRAMUSCULAR | Status: AC

## 2019-11-10 MED ORDER — QUETIAPINE FUMARATE 25 MG PO TABS
25.0000 mg | ORAL_TABLET | Freq: Once | ORAL | Status: AC
  Administered 2019-11-10: 25 mg via ORAL
  Filled 2019-11-10: qty 1

## 2019-11-10 MED ORDER — LORAZEPAM 2 MG/ML IJ SOLN
INTRAMUSCULAR | Status: AC
  Administered 2019-11-10: 1 mg via INTRAVENOUS
  Filled 2019-11-10: qty 1

## 2019-11-10 NOTE — Telephone Encounter (Signed)
I called and left a detailed message informing the son that the medication for his nerves will be discussed at his hosp f/up appointment but the provider stated if it is really bad he can discuss this with the physician at the hospital while he is there.  Kamaile Zachow,cma

## 2019-11-10 NOTE — Progress Notes (Signed)
PROGRESS NOTE    Frank Mcdaniel  [id number] DOB: 1932-08-28 DOA: 11/08/2019 PCP: [name], MD      Assessment & Plan:   Active Problems:   Neutropenic fever (HCC)   Neutropeninc fever: etiology unclear. S/p Rotuxin infusion on 4/5 for recurrent hairy cell leukemia vs transfusion reaction vs infection. Blood cultures positive for staphylococcus epidermidis, often a contaminate but pt is immunosuppressed & pancytopenic. Repeat blood cxs today. Urine culture growing stap epidermidis. Continue with empirically w/ IV cefazolin. Afebrile for 24 hours. Covid19 negative. Unclear if decompensation was related to transfusion reaction or other etiology.  Will hold treatment until etiology can be determined as per onco. Onco recs apprec. Continue decadron 4 mg BID as per onco. Will follow up back in cancer center for further evaluation upon discharge .  Possible bacteremia: blood cx growing staph epidermidis, often a contaminate but pt is immunosuppressed & pancytopenic. Changed IV abxs to cefazolin. Repeat blood cxs ordered.   Pancytopenia: likely secondary to recent chemo. 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 Will hold further treatment as per onco. Onco recs apprec   CKDIIIa: Cr is labile. Will continue to monitor   HTN: hold meds for now as BP on low end of normal   CAD: hold asa due to low plt. Continue statin. Continue to hold imdur  BPH: continue flomax  Alzheimers: w/ behavioral disturbance. Continue on aricept  DVT prophylaxis: SCDs secondary to pancytopenia Code Status: full  Family Communication: discussed pt's care w/ pt's son who is at bedside and answered his questions  Disposition Plan: depends on PT/OT recs    Consultants:   onco   Procedures:    Antimicrobials: cefazolin   Subjective: Pt is oriented to person only  Objective: Vitals:   11/10/19 0300 11/10/19 0400 11/10/19  0500 11/10/19 0600  BP: (!) 148/70 (!) 153/58  123/87  Pulse: (!) 47   (!) 118  Resp: (!) 23 (!) 22 (!) 27 (!) 26  Temp:  98.8 F (37.1 C)    TempSrc:  Oral    SpO2: 99%   96%  Weight:      Height:        Intake/Output Summary (Last 24 hours) at 11/10/2019 0825 Last data filed at 11/10/2019 0600 Gross per 24 hour  Intake 1506.7 ml  Output 1650 ml  Net -143.3 ml   Filed Weights   11/08/19 1632  Weight: 76.4 kg    Examination:  General exam: Appears lethargic Respiratory system: diminished breath sounds b/l. Cardiovascular system: S1 & S2 +. No rubs, gallops or clicks.  Gastrointestinal system: Abdomen is nondistended, soft and NT. Normal bowel sounds heard. Central nervous system: Alert and oriented to person. Moves all 4 extremities  Psychiatry: Judgement and insight appear abnormal.     Data Reviewed: I have personally reviewed following labs and imaging studies  CBC: Recent Labs  Lab 11/08/19 0755 11/08/19 1642 11/09/19 0441 11/10/19 0406  WBC 1.5* 0.2* 3.4* 3.2*  NEUTROABS 1.0* 0.1*  --   --   HGB 10.4* 11.4* 10.0* 9.6*  HCT 30.7* 34.1* 29.4* 28.3*  MCV 100.0 100.6* 99.3 98.6  PLT 77* 24* 22* 33*   Basic Metabolic Panel: Recent Labs  Lab 11/08/19 0755 11/08/19 1642 11/09/19 0441 11/10/19 0406  NA 142 142 140 140  K 3.8 3.7 4.0 4.0  CL 110 110 111 110  CO2 25 23 21* 23  GLUCOSE 113*  106* 141* 133*  BUN 18 20 20  29*  CREATININE 1.60* 1.48* 1.40* 1.31*  CALCIUM 9.1 9.0 8.5* 8.8*   GFR: Estimated Creatinine Clearance: 41.2 mL/min (A) (by C-G formula based on SCr of 1.31 mg/dL (H)). Liver Function Tests: Recent Labs  Lab 11/08/19 1642  AST 62*  ALT 21  ALKPHOS 70  BILITOT 1.1  PROT 6.6  ALBUMIN 4.2   No results for input(s): LIPASE, AMYLASE in the last 168 hours. No results for input(s): AMMONIA in the last 168 hours. Coagulation Profile: Recent Labs  Lab 11/08/19 1642  INR 1.7*   Cardiac Enzymes: No results for input(s): CKTOTAL,  CKMB, CKMBINDEX, TROPONINI in the last 168 hours. BNP (last 3 results) No results for input(s): PROBNP in the last 8760 hours. HbA1C: No results for input(s): HGBA1C in the last 72 hours. CBG: Recent Labs  Lab 11/09/19 0949  GLUCAP 137*   Lipid Profile: No results for input(s): CHOL, HDL, LDLCALC, TRIG, CHOLHDL, LDLDIRECT in the last 72 hours. Thyroid Function Tests: No results for input(s): TSH, T4TOTAL, FREET4, T3FREE, THYROIDAB in the last 72 hours. Anemia Panel: No results for input(s): VITAMINB12, FOLATE, FERRITIN, TIBC, IRON, RETICCTPCT in the last 72 hours. Sepsis Labs: Recent Labs  Lab 11/08/19 1642 11/08/19 2326  PROCALCITON 1.90  --   LATICACIDVEN 3.4* 1.7    Recent Results (from the past 240 hour(s))  Blood Culture (routine x 2)     Status: None (Preliminary result)   Collection Time: 11/08/19  4:43 PM   Specimen: BLOOD  Result Value Ref Range Status   Specimen Description BLOOD LAC  Final   Special Requests   Final    BOTTLES DRAWN AEROBIC AND ANAEROBIC Blood Culture results may not be optimal due to an excessive volume of blood received in culture bottles   Culture  Setup Time   Final    Organism ID to follow [place] CRITICAL RESULT CALLED TO, READ BACK BY AND VERIFIED WITH: Rito Ehrlich 11/09/19 @ [place]  [facility] Performed at [facility], [address] [place], [place] [address]    Culture GRAM POSITIVE COCCI  Final   Report Status PENDING  Incomplete  Blood Culture ID Panel (Reflexed)     Status: Abnormal   Collection Time: 11/08/19  4:43 PM  Result Value Ref Range Status   Enterococcus species NOT DETECTED NOT DETECTED Final   Listeria monocytogenes NOT DETECTED NOT DETECTED Final   Staphylococcus species DETECTED (A) NOT DETECTED Final    Comment: Methicillin (oxacillin) susceptible coagulase negative staphylococcus. Possible blood culture contaminant (unless isolated from more than one blood culture draw or  clinical case suggests pathogenicity). No antibiotic treatment is indicated for blood  culture contaminants. CRITICAL RESULT CALLED TO, READ BACK BY AND VERIFIED WITH: WALID NAZARI 11/09/19 @ [id number]  [place]    Staphylococcus aureus (BCID) NOT DETECTED NOT DETECTED Final   Methicillin resistance NOT DETECTED NOT DETECTED Final   Streptococcus species NOT DETECTED NOT DETECTED Final   Streptococcus agalactiae NOT DETECTED NOT DETECTED Final   Streptococcus pneumoniae NOT DETECTED NOT DETECTED Final   Streptococcus pyogenes NOT DETECTED NOT DETECTED Final   Acinetobacter baumannii NOT DETECTED NOT DETECTED Final   Enterobacteriaceae species NOT DETECTED NOT DETECTED Final   Enterobacter cloacae complex NOT DETECTED NOT DETECTED Final   Escherichia coli NOT DETECTED NOT DETECTED Final   Klebsiella oxytoca NOT DETECTED NOT DETECTED Final   Klebsiella pneumoniae NOT DETECTED NOT DETECTED Final   Proteus  species NOT DETECTED NOT DETECTED Final   Serratia marcescens NOT DETECTED NOT DETECTED Final   Haemophilus influenzae NOT DETECTED NOT DETECTED Final   Neisseria meningitidis NOT DETECTED NOT DETECTED Final   Pseudomonas aeruginosa NOT DETECTED NOT DETECTED Final   Candida albicans NOT DETECTED NOT DETECTED Final   Candida glabrata NOT DETECTED NOT DETECTED Final   Candida krusei NOT DETECTED NOT DETECTED Final   Candida parapsilosis NOT DETECTED NOT DETECTED Final   Candida tropicalis NOT DETECTED NOT DETECTED Final    Comment: Performed at [facility], [place], [place] [address]  SARS CORONAVIRUS 2 (TAT 6-24 HRS) Nasopharyngeal Nasopharyngeal Swab     Status: None   Collection Time: 11/08/19  6:00 PM   Specimen: Nasopharyngeal Swab  Result Value Ref Range Status   SARS Coronavirus 2 NEGATIVE NEGATIVE Final    Comment: (NOTE) SARS-CoV-2 target nucleic acids are NOT DETECTED. The SARS-CoV-2 RNA is generally detectable in upper and lower respiratory specimens during  the acute phase of infection. Negative results do not preclude SARS-CoV-2 infection, do not rule out co-infections with other pathogens, and should not be used as the sole basis for treatment or other patient management decisions. Negative results must be combined with clinical observations, patient history, and epidemiological information. The expected result is Negative. Fact Sheet for Patients: SugarRoll.be Fact Sheet for Healthcare Providers: https://www.woods-mathews.com/ This test is not yet approved or cleared by the [place] FDA and  has been authorized for detection and/or diagnosis of SARS-CoV-2 by FDA under an Emergency Use Authorization (EUA). This EUA will remain  in effect (meaning this test can be used) for the duration of the COVID-19 declaration under Section 56 4(b)(1) of the Act, 21 U.S.C. section 360bbb-3(b)(1), unless the authorization is terminated or revoked sooner. Performed at [facility], [place] [address] [place], [place] [address]   MRSA PCR Screening     Status: None   Collection Time: 11/09/19  9:41 AM   Specimen: Nasal Mucosa; Nasopharyngeal  Result Value Ref Range Status   MRSA by PCR NEGATIVE NEGATIVE Final    Comment:        The GeneXpert MRSA Assay (FDA approved for NASAL specimens only), is one component of a comprehensive MRSA colonization surveillance program. It is not intended to diagnose MRSA infection nor to guide or monitor treatment for MRSA infections. Performed at [facility], [address] [place], [place] [address]          Radiology Studies: CT Angio Chest PE W and/or Wo Contrast  Result Date: 11/08/2019 CLINICAL DATA:  Hypoxia, fever EXAM: CT ANGIOGRAPHY CHEST WITH CONTRAST TECHNIQUE: Multidetector CT imaging of the chest was performed using the standard protocol during bolus administration of intravenous contrast. Multiplanar CT image reconstructions and MIPs  were obtained to evaluate the vascular anatomy. CONTRAST:  48mL OMNIPAQUE IOHEXOL 350 MG/ML SOLN COMPARISON:  01/13/2015 FINDINGS: Cardiovascular: No filling defects in the pulmonary arteries to suggest pulmonary emboli. Heart is normal size. Prior CABG. Aortic atherosclerosis. No aneurysm. Mediastinum/Nodes: No mediastinal, hilar, or axillary adenopathy. Small hiatal hernia. Esophagus mildly distended with gas and fluid may be related to reflux. Distal esophageal wall appears mildly thickened, possibly related to reflux esophagitis. Lungs/Pleura: Lungs are clear. No focal airspace opacities or suspicious nodules. No effusions. Upper Abdomen: Gallstone noted with fluid in the gallbladder, stable. No acute findings. Musculoskeletal: Chest wall soft tissues are unremarkable. No acute bony abnormality. Review of the MIP images confirms the above findings.  IMPRESSION: No evidence of pulmonary embolus. Prior CABG. Small hiatal hernia. Distal esophageal wall thickening and mild distention of the esophagus with air and fluid. Findings may be related to reflux. Aortic Atherosclerosis (ICD10-I70.0). Electronically Signed   By: Rolm Baptise M.D.   On: 11/08/2019 17:46   DG Chest Port 1 View  Result Date: 11/08/2019 CLINICAL DATA:  Shortness of breath EXAM: PORTABLE CHEST 1 VIEW COMPARISON:  04/10/2016 FINDINGS: Prior CABG. Heart is normal size. Low lung volumes. No confluent opacities or effusions. No acute bony abnormality. IMPRESSION: Low lung volumes.  No acute cardiopulmonary disease. Electronically Signed   By: Rolm Baptise M.D.   On: 11/08/2019 17:24        Scheduled Meds: . calcium carbonate  1 tablet Oral BID  . Chlorhexidine Gluconate Cloth  6 each Topical Daily  . dexamethasone  4 mg Oral Q12H  . donepezil  10 mg Oral QHS  . famotidine  20 mg Oral Daily  . mouth rinse  15 mL Mouth Rinse BID  . multivitamin with minerals  1 tablet Oral Daily  . QUEtiapine  25 mg Oral Once  . tamsulosin  0.4 mg Oral  QPC breakfast   Continuous Infusions: . sodium chloride 50 mL/hr at 11/09/19 2100  . sodium chloride    . ceFEPime (MAXIPIME) IV 2 g (11/10/19 [id number])  . vancomycin 1,250 mg (11/10/19 0754)     LOS: 2 days    Time spent: 35 mins     Wyvonnia Dusky, MD [facility] Pager [phone]  If 7PM-7AM, please contact night-coverage [email or web address] 11/10/2019, 8:25 AM

## 2019-11-10 NOTE — TOC Initial Note (Addendum)
Transition of Care [facility]) - Initial/Assessment Note    Patient Details  Name: Frank Mcdaniel MRN: [id number] Date of Birth: Nov 12, 1932  [facility]) CM/SW Contact:    Magnus Ivan, LCSW Phone Number: 11/10/2019, 1:06 PM  Clinical Narrative:           Readmission risk score increased today. Per rounds/chart review, patient is disoriented. CSW completed Readmission Screen with patient's son, Frank Mcdaniel. Son reported patient lives with him and he provides patient with transport to appointments. Son says he works full time. PCP is Dr. Caryl Bis. [facility]acy [facility] [place], son denied issues with obtaining meds. Son reported patient has no DME, HH, or SNF history. Encouraged son to reach out with any questions or needs. CSW will continue to follow.           Barriers to Discharge: Continued Medical Work up   Patient Goals and CMS Choice        Expected Discharge Plan and Services         Living arrangements for the past 2 months: Single Family Home                                      Prior Living Arrangements/Services Living arrangements for the past 2 months: Single Family Home Lives with:: Adult Children(son) Patient language and need for interpreter reviewed:: Yes        Need for Family Participation in Patient Care: Yes (Comment) Care giver support system in place?: Yes (comment)   Criminal Activity/Legal Involvement Pertinent to Current Situation/Hospitalization: No - Comment as needed  Activities of Daily Living Home Assistive Devices/Equipment: Oxygen, Hearing aid ADL Screening (condition at time of admission) Patient's cognitive ability adequate to safely complete daily activities?: Yes Is the patient deaf or have difficulty hearing?: Yes Does the patient have difficulty seeing, even when wearing glasses/contacts?: No Does the patient have difficulty concentrating, remembering, or making decisions?: No Patient able to express  need for assistance with ADLs?: Yes Does the patient have difficulty dressing or bathing?: No Independently performs ADLs?: Yes (appropriate for developmental age) Does the patient have difficulty walking or climbing stairs?: Yes Weakness of Legs: Both Weakness of Arms/Hands: None  Permission Sought/Granted                  Emotional Assessment       Orientation: : Fluctuating Orientation (Suspected and/or reported Sundowners) Alcohol / Substance Use: Not Applicable Psych Involvement: No (comment)  Admission diagnosis:  Hypoxia [R09.02] SIRS (systemic inflammatory response syndrome) (HCC) [R65.10] Neutropenic fever ([place]) [D70.9, R50.81] Fever, unspecified fever cause [R50.9] Schistocytes on peripheral blood smear [R71.8] Patient Active Problem List   Diagnosis Date Noted  . Neutropenic fever ([place]) 11/08/2019  . Goals of care, counseling/discussion 10/18/2019  . IBS (irritable bowel syndrome) 08/02/2019  . Dementia without behavioral disturbance ([place]) 05/28/2019  . Status post inguinal hernia repair 09/13/2016  . Constipation 05/21/2016  . Memory loss or impairment 02/15/2016  . Chronic fatigue 12/21/2015  . Bilateral low back pain without sciatica 12/21/2015  . Anxiety 09/11/2015  . Anemia 09/01/2015  . GERD (gastroesophageal reflux disease) 06/02/2015  . Asthma 01/28/2015  . BPH (benign prostatic hyperplasia) 01/28/2015  . Hairy cell leukemia ([place]) 01/28/2015  . Coronary artery disease   . S/P CABG (coronary artery bypass graft) 11/08/2010  . HTN (hypertension) 11/08/2010  . Bradycardia 11/08/2010  .  Hyperlipidemia 10/27/2009   PCP:  [name], MD Pharmacy[facility]   [id number] [place], [place] - [address] [place] [address] Phone: [phone] Fax: [phone]     Social Determinants of Health (SDOH) Interventions    Readmission Risk Interventions Readmission Risk Prevention Plan 11/10/2019  Transportation Screening  Complete  PCP or Specialist Appt within 3-5 Days Complete  HRI or Home Care Consult Complete  Medication Review (RN Care Manager) Complete  Some recent data might be hidden

## 2019-11-10 NOTE — Progress Notes (Signed)
Pt pushed  Nursing staffkicking legs and trying to get out of bed. Pull all monitor leads BP cuff and Pulse Ox . Pt is very combative and agitated. Inj ativan 1 mg given as ordered. V/s stable. Will continue to observe closely.

## 2019-11-10 NOTE — Progress Notes (Signed)
This RN gave bedside report to Allie Bossier., RN.  Patient escorted safely to room 157 on [facility], Cardiac monitor, and all personal belongings.  This RN personally handed (2), hearing aids and a package of batteries, patient's cell phone, and shaving kit to RN.  This RN also advised Gerald Stabs that patient trends Bradycardic on monitor (MD is aware), and at this time is oriented to only person.  Patient is high fall risk.  Patient bed in lowest position, and appeared to be resting during report.  Patient has condom catheter on (intact and draining). 22g IV in left hand flushed, as well as wrapped in coban.  MD and Charge nurse aware of transfer, as well as bed assignment.

## 2019-11-10 NOTE — Progress Notes (Signed)
OVERNIGHT Notified by nursing patient agitated and combative endangering himself and staff. Unable to redirect.   Review of patient known dementia with behavior disturbance. He is on aricept and seroquel for management.  He has required intermittent dosing with benzodiazepines for behavioral issues.  QTc on last EKG 478,  And current heart rate 60.  Due to risk of QT prolongation wth use of additional antipsychotic med,, patient was ordered 0.5 mg ativan IV  and sitter is needed at bedside.

## 2019-11-11 ENCOUNTER — Inpatient Hospital Stay: Payer: PPO

## 2019-11-11 ENCOUNTER — Ambulatory Visit: Payer: PPO | Admitting: Oncology

## 2019-11-11 ENCOUNTER — Other Ambulatory Visit: Payer: PPO

## 2019-11-11 DIAGNOSIS — Z79899 Other long term (current) drug therapy: Secondary | ICD-10-CM

## 2019-11-11 DIAGNOSIS — I2510 Atherosclerotic heart disease of native coronary artery without angina pectoris: Secondary | ICD-10-CM

## 2019-11-11 DIAGNOSIS — F0390 Unspecified dementia without behavioral disturbance: Secondary | ICD-10-CM

## 2019-11-11 DIAGNOSIS — D649 Anemia, unspecified: Secondary | ICD-10-CM

## 2019-11-11 DIAGNOSIS — D709 Neutropenia, unspecified: Secondary | ICD-10-CM

## 2019-11-11 DIAGNOSIS — Z951 Presence of aortocoronary bypass graft: Secondary | ICD-10-CM

## 2019-11-11 DIAGNOSIS — C9142 Hairy cell leukemia, in relapse: Secondary | ICD-10-CM

## 2019-11-11 DIAGNOSIS — D696 Thrombocytopenia, unspecified: Secondary | ICD-10-CM

## 2019-11-11 DIAGNOSIS — R5081 Fever presenting with conditions classified elsewhere: Secondary | ICD-10-CM

## 2019-11-11 LAB — URINE CULTURE: Culture: 10000 — AB

## 2019-11-11 LAB — CBC
HCT: 28.2 % — ABNORMAL LOW (ref 39.0–52.0)
Hemoglobin: 9.8 g/dL — ABNORMAL LOW (ref 13.0–17.0)
MCH: 33.7 pg (ref 26.0–34.0)
MCHC: 34.8 g/dL (ref 30.0–36.0)
MCV: 96.9 fL (ref 80.0–100.0)
Platelets: 34 10*3/uL — ABNORMAL LOW (ref 150–400)
RBC: 2.91 MIL/uL — ABNORMAL LOW (ref 4.22–5.81)
RDW: 15.2 % (ref 11.5–15.5)
WBC: 1.5 10*3/uL — ABNORMAL LOW (ref 4.0–10.5)
nRBC: 0 % (ref 0.0–0.2)

## 2019-11-11 LAB — COMPREHENSIVE METABOLIC PANEL
ALT: 12 U/L (ref 0–44)
AST: 23 U/L (ref 15–41)
Albumin: 3.3 g/dL — ABNORMAL LOW (ref 3.5–5.0)
Alkaline Phosphatase: 43 U/L (ref 38–126)
Anion gap: 4 — ABNORMAL LOW (ref 5–15)
BUN: 23 mg/dL (ref 8–23)
CO2: 24 mmol/L (ref 22–32)
Calcium: 8.2 mg/dL — ABNORMAL LOW (ref 8.9–10.3)
Chloride: 111 mmol/L (ref 98–111)
Creatinine, Ser: 1.15 mg/dL (ref 0.61–1.24)
GFR calc Af Amer: >60 mL/min (ref >60)
GFR calc non Af Amer: 58 mL/min — ABNORMAL LOW (ref >60)
Glucose, Bld: 87 mg/dL (ref 70–99)
Potassium: 3.2 mmol/L — ABNORMAL LOW (ref 3.5–5.1)
Sodium: 139 mmol/L (ref 135–145)
Total Bilirubin: 0.7 mg/dL (ref 0.3–1.2)
Total Protein: 5.3 g/dL — ABNORMAL LOW (ref 6.5–8.1)

## 2019-11-11 MED ORDER — LORAZEPAM 2 MG/ML IJ SOLN
1.0000 mg | Freq: Once | INTRAMUSCULAR | Status: AC
  Administered 2019-11-11: 1 mg via INTRAVENOUS
  Filled 2019-11-11: qty 1

## 2019-11-11 MED ORDER — POTASSIUM CHLORIDE CRYS ER 20 MEQ PO TBCR
20.0000 meq | EXTENDED_RELEASE_TABLET | Freq: Once | ORAL | Status: AC
  Administered 2019-11-11: 20 meq via ORAL
  Filled 2019-11-11: qty 1

## 2019-11-11 NOTE — Progress Notes (Signed)
D: Pt alert and oriented x 3 (disoriented to time, easily reoriented).  Pt denies experiencing any pain at this time. Pt's appetite is better today according to pt's son. Pt's son was at bedside and requested to speak the txing MD and infectious MD. Iris Pert MD was notified and given son's cell phone number which son also left on pt's white board.   A: Scheduled medications administered to pt, per MD orders. Support and encouragement provided. Frequent verbal contact made.    R: No adverse drug reactions noted. Pt complaint with medications and treatment plan. Pt interacts well with staff on the unit during day shift. Pt is stable at this time, Will continue to monitor and provide care for as ordered.  Pt requested to get out of bed. Pt sat and dangled at bedside, then stood, then was OOB with sitter and walked around nurse's station. Pt's O2 stat maintained in high 90's without oxygen while OOB.  Pt has not been combative during day shift.

## 2019-11-11 NOTE — TOC Progression Note (Signed)
Transition of Care [facility]) - Progression Note    Patient Details  Name: Frank Mcdaniel MRN: [id number] Date of Birth: 05-04-1933  Transition of Care [facility]) CM/SW [place], RN Phone Number: 11/11/2019, 4:30 PM  Clinical Narrative:     Met with the patient and his son in the room, He lives at home with his son, the son does work during the day He walks at home at baseline independent without assisted device He is alert and oriented X4 He is frustrated having to sit in the bed,  The [facility] Holter is going to take the patient to walk in the hall. I encouraged the son to think about some help at home in the future Will continue to monitor the patient for needs    Barriers to Discharge: Continued Medical Work up  Expected Discharge Plan and Services         Living arrangements for the past 2 months: Single Family Home                                       Social Determinants of Health (SDOH) Interventions    Readmission Risk Interventions Readmission Risk Prevention Plan 11/10/2019  Transportation Screening Complete  PCP or Specialist Appt within 3-5 Days Complete  HRI or Home Care Consult Complete  Medication Review (RN Care Manager) Complete  Some recent data might be hidden

## 2019-11-11 NOTE — Plan of Care (Signed)

## 2019-11-11 NOTE — Care Management Important Message (Signed)
Important Message  Patient Details  Name: Frank Mcdaniel MRN: [id number] Date of Birth: 12/03/1932   Medicare Important Message Given:  Yes     Juliann Pulse A [place] Jr 11/11/2019, 10:43 AM

## 2019-11-11 NOTE — Consult Note (Signed)
NAME: Frank Mcdaniel  DOB: [birth year]  MRN: [id number]  Date/Time: 11/11/2019 7:38 PM  REQUESTING PROVIDER: Dr.Williams Subjective:  REASON FOR CONSULT: Febrile neutropenia ?pt is a poor historian, chart reviewed Frank Mcdaniel is a 86 y.o. male with a history of CAD s/p CABG, dementia ,recurrent Hairy cell leukemia  in March 2021 undergoing chemo with cladribine and rituxan  last dose on April 5th Is admitted with fever and sob after receiving chemo on 11/08/19- he was sent to the ED with concern for an  allergic reaction as he was shaking and feeling weak after chemo. In the ED temp is 101, BP 134/69, HR 107, RR 28, Pulse ox 100% on oxygen. Blood culture sent . CXR neg for any infiltrate, cr 1.48, WBC 0.2. He was admitted as febrile neutropenia and started on vanco and cefepime. Only 1 set of blood culture was sent and it had coag neg staph in anerobic bottle and gram positive rod in aerobic bottle- Urine culture had 10K of staph epi and 7K of aerococcus His antibiotic has been de-escalated to cefazolin and I am asked to see the patient for antibiotic recommendation  pt has been afebrile since admission. Wbc improving  Past Medical History:  Diagnosis Date  . Anemia 09/01/2015  . Anxiety 09/11/2015  . Arthritis   . Asthma    as a teenager  . Asymptomatic Sinus Bradycardia   . Atypical chest pain    a. 08/2014  . BPH (benign prostatic hyperplasia)   . CKD (chronic kidney disease), stage III   . Closed fracture of tuft of distal phalanx of finger 08/31/2018  . Coronary artery disease    a. 2000 s/p CABG;  b. 10/2008 Neg MV, EF 64%; c. cath 05/2015: LM 85% sev cal, pLAD-1 lesion 80%, pLAD-2 lesion 100% chronic, ostLCx 70% sev cal, OM4 90%, ostRCA 70%, mid RCA 90%, LIMA-LAD patent, VG-OM2 patent, VG-OM3 patent, VG-RPDA patent. No AS  . Dementia without behavioral disturbance ([place]) 05/28/2019  . GERD (gastroesophageal reflux disease)   . H/O echocardiogram    a. 10/2005 Echo: nl EF.  [name]  Hairy cell leukemia ([place])   . HOH (hard of hearing)    Bilateral hearing aids  . HTN (hypertension)   . Hyperlipidemia   . Hypertension   . Leukemia ([place])   . Stricture and stenosis of esophagus     Past Surgical History:  Procedure Laterality Date  . CARDIAC CATHETERIZATION N/A 05/26/2015   Procedure: Left Heart Cath;  Surgeon: [name], MD;  Location: [facility];  Service: Cardiovascular;  Laterality: N/A;  . CARDIAC CATHETERIZATION N/A 05/26/2015   Procedure: Coronary/Graft Angiography;  Surgeon: [name], MD;  Location: [facility];  Service: Cardiovascular;  Laterality: N/A;  . CATARACT EXTRACTION    . CORONARY ARTERY BYPASS GRAFT  2000  . ESOPHAGOGASTRODUODENOSCOPY N/A 02/18/2017   Procedure: ESOPHAGOGASTRODUODENOSCOPY (EGD) with removal of food bolus;  Surgeon: [name], MD;  Location: [facility];  Service: Endoscopy;  Laterality: N/A;  . ESOPHAGOGASTRODUODENOSCOPY (EGD) WITH PROPOFOL N/A 03/11/2017   Procedure: ESOPHAGOGASTRODUODENOSCOPY (EGD) WITH PROPOFOL;  Surgeon: [name], MD;  Location: [facility] ENDOSCOPY;  Service: Endoscopy;  Laterality: N/A;  . ESOPHAGOGASTRODUODENOSCOPY (EGD) WITH PROPOFOL N/A 04/15/2017   Procedure: ESOPHAGOGASTRODUODENOSCOPY (EGD) WITH PROPOFOL;  Surgeon: [name], MD;  Location: [facility] ENDOSCOPY;  Service: Endoscopy;  Laterality: N/A;  . EYE SURGERY Left    Cataract Extraction with IOL  . INGUINAL HERNIA REPAIR Left 06/24/2016   Procedure:  HERNIA REPAIR INGUINAL ADULT;  Surgeon: [name], MD;  Location: [facility] ORS;  Service: General;  Laterality: Left;  . TONSILLECTOMY  1942   . UPPER GASTROINTESTINAL ENDOSCOPY  3 years ago    with Dilation.     Social History   Socioeconomic History  . Marital status: Widowed    Spouse name: Not on file  . Number of children: 1  . Years of education: Not on file  . Highest education level: Not on file  Occupational History  . Occupation: retired  Tobacco  Use  . Smoking status: Never Smoker  . Smokeless tobacco: Never Used  Substance and Sexual Activity  . Alcohol use: No  . Drug use: No  . Sexual activity: Never  Other Topics Concern  . Not on file  Social History Narrative   Lives locally with son.  Fairly active around the house though does not routinely exercise.  Retired from [employer].   Social Determinants of Health   Financial Resource Strain:   . Difficulty of Paying Living Expenses:   Food Insecurity:   . Worried About Charity fundraiser in the Last Year:   . Arboriculturist in the Last Year:   Transportation Needs:   . Film/video editor (Medical):   [name] Lack of Transportation (Non-Medical):   Physical Activity:   . Days of Exercise per Week:   . Minutes of Exercise per Session:   Stress: No Stress Concern Present  . Feeling of Stress : Not at all  Social Connections:   . Frequency of Communication with Friends and Family:   . Frequency of Social Gatherings with Friends and Family:   . Attends Religious Services:   . Active Member of Clubs or Organizations:   . Attends Archivist Meetings:   [name] Marital Status:   Intimate Partner Violence:   . Fear of Current or Ex-Partner:   . Emotionally Abused:   [name] Physically Abused:   . Sexually Abused:     Family History  Problem Relation Age of Onset  . Hypertension Mother   . Arthritis Father   . Other Other        no premature CAD.  [name] Colon cancer Neg Hx   . Prostate cancer Neg Hx   . Stomach cancer Neg Hx    No Known Allergies  ? Current Facility-Administered Medications  Medication Dose Route Frequency Provider Last Rate Last Admin  . 0.9 %  sodium chloride infusion   Intravenous Continuous Nolberto Hanlon, MD 50 mL/hr at 11/11/19 0205 New Bag at 11/11/19 0205  . 0.9 %  sodium chloride infusion   Intravenous PRN Nolberto Hanlon, MD      . acetaminophen (TYLENOL) tablet 650 mg  650 mg Oral Q6H PRN Nolberto Hanlon, MD       Or  . acetaminophen (TYLENOL)  suppository 650 mg  650 mg Rectal Q6H PRN Nolberto Hanlon, MD      . calcium carbonate (TUMS - dosed in mg elemental calcium) chewable tablet 200 mg of elemental calcium  1 tablet Oral BID Nolberto Hanlon, MD   200 mg of elemental calcium at 11/11/19 0940  . ceFAZolin (ANCEF) IVPB 2g/100 mL premix  2 g Intravenous Q8H Wyvonnia Dusky, MD 200 mL/hr at 11/11/19 1356 2 g at 11/11/19 1356  . Chlorhexidine Gluconate Cloth 2 % PADS 6 each  6 each Topical Daily Nolberto Hanlon, MD   6 each at 11/11/19 1605  . dexamethasone (  DECADRON) tablet 4 mg  4 mg Oral Q12H [name], MD   4 mg at 11/11/19 0941  . donepezil (ARICEPT) tablet 10 mg  10 mg Oral QHS Nolberto Hanlon, MD   10 mg at 11/09/19 2030  . famotidine (PEPCID) tablet 20 mg  20 mg Oral Daily Nolberto Hanlon, MD   20 mg at 11/11/19 0940  . MEDLINE mouth rinse  15 mL Mouth Rinse BID Nolberto Hanlon, MD   15 mL at 11/11/19 0941  . multivitamin with minerals tablet 1 tablet  1 tablet Oral Daily Nolberto Hanlon, MD   1 tablet at 11/11/19 0940  . ondansetron (ZOFRAN) tablet 8 mg  8 mg Oral BID PRN Nolberto Hanlon, MD      . polyethylene glycol (MIRALAX / GLYCOLAX) packet 17 g  17 g Oral Daily PRN Nolberto Hanlon, MD      . prochlorperazine (COMPAZINE) tablet 10 mg  10 mg Oral Q6H PRN Nolberto Hanlon, MD      . tamsulosin (FLOMAX) capsule 0.4 mg  0.4 mg Oral QPC breakfast Nolberto Hanlon, MD   0.4 mg at 11/11/19 0940     Abtx:  Anti-infectives (From admission, onward)   Start     Dose/Rate Route Frequency Ordered Stop   11/10/19 1500  ceFAZolin (ANCEF) IVPB 2g/100 mL premix     2 g 200 mL/hr over 30 Minutes Intravenous Every 8 hours 11/10/19 1446     11/10/19 0600  vancomycin (VANCOREADY) IVPB 1250 mg/250 mL  Status:  Discontinued     1,250 mg 166.7 mL/hr over 90 Minutes Intravenous Every 36 hours 11/08/19 1835 11/10/19 1446   11/09/19 0600  ceFEPIme (MAXIPIME) 2 g in sodium chloride 0.9 % 100 mL IVPB  Status:  Discontinued     2 g 200 mL/hr over 30 Minutes  Intravenous Every 12 hours 11/08/19 1835 11/10/19 1446   11/08/19 2000  vancomycin (VANCOCIN) IVPB 1000 mg/200 mL premix     1,000 mg 200 mL/hr over 60 Minutes Intravenous  Once 11/08/19 1805 11/08/19 2321   11/08/19 1745  ceFEPIme (MAXIPIME) 2 g in sodium chloride 0.9 % 100 mL IVPB     2 g 200 mL/hr over 30 Minutes Intravenous  Once 11/08/19 1738 11/08/19 1841   11/08/19 1745  metroNIDAZOLE (FLAGYL) IVPB 500 mg     500 mg 100 mL/hr over 60 Minutes Intravenous  Once 11/08/19 1738 11/08/19 1942   11/08/19 1745  vancomycin (VANCOCIN) IVPB 1000 mg/200 mL premix     1,000 mg 200 mL/hr over 60 Minutes Intravenous  Once 11/08/19 1738 11/08/19 2124      REVIEW OF SYSTEMS: pt does not remember the events leading to his admission Says he is feeling better now Const:  Denies fever, negative chills, negative weight loss Eyes: negative diplopia or visual changes, negative eye pain ENT: negative coryza, negative sore throat Resp: negative cough, hemoptysis, dyspnea Cards: negative for chest pain, palpitations, lower extremity edema GU: negative for frequency, dysuria and hematuria GI: Negative for abdominal pain, diarrhea, bleeding, constipation Skin: negative for rash and pruritus Heme: negative for easy bruising and gum/nose bleeding MS: c/o generalized weakness Neurolo:confusion  Psych: negative for feelings of anxiety, depression  Endocrine: negative for thyroid, diabetes Allergy/Immunology- negative for any medication or food allergies  Objective:  VITALS:  BP 110/67 (BP Location: Left Arm)   Pulse (!) 52   Temp 98.3 F (36.8 C) (Oral)   Resp 18   Ht 5\' 9"  (1.753 m)   Wt  76.4 kg   SpO2 98%   BMI 24.87 kg/m  PHYSICAL EXAM:  General: Alert, cooperative, no distress,hard of hearing, oriented in place and person, some confusion Head: Normocephalic, without obvious abnormality, atraumatic. Eyes: Conjunctivae clear, anicteric sclerae. Pupils are equal ENT Nares normal. No drainage  or sinus tenderness. Lips, mucosa, and tongue normal. No Thrush Neck: Supple, symmetrical, no adenopathy, thyroid: non tender no carotid bruit and no JVD. Back: No CVA tenderness. Lungs: b/l air entry. Heart: s1s2 Abdomen: Soft, non-tender,not distended. Bowel sounds normal. No masses Extremities: bruising over arms Skin: No rashes or lesionsg Lymph: Cervical, supraclavicular normal. Neurologic: Grossly non-focal Pertinent Labs Lab Results CBC    Component Value Date/Time   WBC 1.5 (L) 11/11/2019 0542   RBC 2.91 (L) 11/11/2019 0542   HGB 9.8 (L) 11/11/2019 0542   HGB 9.4 (L) 05/11/2015 1505   HCT 28.2 (L) 11/11/2019 0542   HCT 29.3 (L) 05/11/2015 1505   PLT 34 (L) 11/11/2019 0542   PLT 172 05/11/2015 1505   MCV 96.9 11/11/2019 0542   MCV 109 (H) 05/11/2015 1505   MCV 112 (H) 12/01/2014 1421   MCH 33.7 11/11/2019 0542   MCHC 34.8 11/11/2019 0542   RDW 15.2 11/11/2019 0542   RDW 14.4 05/11/2015 1505   RDW 15.4 (H) 12/01/2014 1421   LYMPHSABS 0.0 (L) 11/08/2019 1642   LYMPHSABS 0.7 (L) 12/01/2014 1421   MONOABS 0.0 (L) 11/08/2019 1642   MONOABS 0.1 (L) 12/01/2014 1421   EOSABS 0.0 11/08/2019 1642   EOSABS 0.1 12/01/2014 1421   BASOSABS 0.0 11/08/2019 1642   BASOSABS 0.0 12/01/2014 1421    CMP Latest Ref Rng & Units 11/11/2019 11/10/2019 11/09/2019  Glucose 70 - 99 mg/dL 87 133(H) 141(H)  BUN 8 - 23 mg/dL 23 29(H) 20  Creatinine 0.61 - 1.24 mg/dL 1.15 1.31(H) 1.40(H)  Sodium 135 - 145 mmol/L 139 140 140  Potassium 3.5 - 5.1 mmol/L 3.2(L) 4.0 4.0  Chloride 98 - 111 mmol/L 111 110 111  CO2 22 - 32 mmol/L 24 23 21(L)  Calcium 8.9 - 10.3 mg/dL 8.2(L) 8.8(L) 8.5(L)  Total Protein 6.5 - 8.1 g/dL 5.3(L) - -  Total Bilirubin 0.3 - 1.2 mg/dL 0.7 - -  Alkaline Phos 38 - 126 U/L 43 - -  AST 15 - 41 U/L 23 - -  ALT 0 - 44 U/L 12 - -      Microbiology: Recent Results (from the past 240 hour(s))  Blood Culture (routine x 2)     Status: Abnormal (Preliminary result)    Collection Time: 11/08/19  4:43 PM   Specimen: BLOOD  Result Value Ref Range Status   Specimen Description   Final    BLOOD LAC Performed at [facility], [address] [place], [place] [address]    Special Requests   Final    BOTTLES DRAWN AEROBIC AND ANAEROBIC Blood Culture results may not be optimal due to an excessive volume of blood received in culture bottles Performed at [facility], [address] [place], [place] [address]    Culture  Setup Time   Final    Organism ID to follow [place] TO, READ BACK BY AND VERIFIED WITH: WALID NAZARI 11/09/19 @ 1445  [place] CRITICAL RESULT CALLED TO, READ BACK BY AND VERIFIED WITH: DAVID PATEL @0045  11/11/19 AKT GRAM POSITIVE RODS Performed at [facility], [address], [place], [place] [address]  Culture (A)  Final    STAPHYLOCOCCUS EPIDERMIDIS CULTURE REINCUBATED FOR BETTER GROWTH Performed at [facility], [place] [address], [place], [place] [address]    Report Status PENDING  Incomplete   Organism ID, Bacteria STAPHYLOCOCCUS EPIDERMIDIS  Final      Susceptibility   Staphylococcus epidermidis - MIC*    CIPROFLOXACIN <=0.5 SENSITIVE Sensitive     ERYTHROMYCIN >=8 RESISTANT Resistant     GENTAMICIN <=0.5 SENSITIVE Sensitive     OXACILLIN <=0.25 SENSITIVE Sensitive     TETRACYCLINE 2 SENSITIVE Sensitive     VANCOMYCIN <=0.5 SENSITIVE Sensitive     TRIMETH/SULFA <=10 SENSITIVE Sensitive     CLINDAMYCIN <=0.25 SENSITIVE Sensitive     RIFAMPIN <=0.5 SENSITIVE Sensitive     Inducible Clindamycin NEGATIVE Sensitive     * STAPHYLOCOCCUS EPIDERMIDIS  Urine culture     Status: Abnormal   Collection Time: 11/08/19  4:43 PM   Specimen: In/Out Cath Urine  Result Value Ref Range Status   Specimen Description   Final    IN/OUT CATH URINE Performed at [facility], [address] [place], [place] [address]    Special  Requests   Final    NONE Performed at [facility], [address], [place], [place] [address]    Culture (A)  Final    10,000 COLONIES/mL STAPHYLOCOCCUS EPIDERMIDIS 7,000 COLONIES/mL AEROCOCCUS URINAE ORGANISM 2 Standardized susceptibility testing for this organism is not available. Performed at [facility], [place] [address], [place], [place] [address]    Report Status 11/11/2019 FINAL  Final   Organism ID, Bacteria STAPHYLOCOCCUS EPIDERMIDIS (A)  Final      Susceptibility   Staphylococcus epidermidis - MIC*    CIPROFLOXACIN <=0.5 SENSITIVE Sensitive     GENTAMICIN <=0.5 SENSITIVE Sensitive     NITROFURANTOIN <=16 SENSITIVE Sensitive     OXACILLIN <=0.25 SENSITIVE Sensitive     TETRACYCLINE <=1 SENSITIVE Sensitive     VANCOMYCIN 2 SENSITIVE Sensitive     TRIMETH/SULFA <=10 SENSITIVE Sensitive     CLINDAMYCIN 2 INTERMEDIATE Intermediate     RIFAMPIN <=0.5 SENSITIVE Sensitive     Inducible Clindamycin NEGATIVE Sensitive     * 10,000 COLONIES/mL STAPHYLOCOCCUS EPIDERMIDIS  Blood Culture ID Panel (Reflexed)     Status: Abnormal   Collection Time: 11/08/19  4:43 PM  Result Value Ref Range Status   Enterococcus species NOT DETECTED NOT DETECTED Final   Listeria monocytogenes NOT DETECTED NOT DETECTED Final   Staphylococcus species DETECTED (A) NOT DETECTED Final    Comment: Methicillin (oxacillin) susceptible coagulase negative staphylococcus. Possible blood culture contaminant (unless isolated from more than one blood culture draw or clinical case suggests pathogenicity). No antibiotic treatment is indicated for blood  culture contaminants. CRITICAL RESULT CALLED TO, READ BACK BY AND VERIFIED WITH: WALID NAZARI 11/09/19 @ [id number]  [place]    Staphylococcus aureus (BCID) NOT DETECTED NOT DETECTED Final   Methicillin resistance NOT DETECTED NOT DETECTED Final   Streptococcus species NOT DETECTED NOT DETECTED Final   Streptococcus agalactiae NOT DETECTED NOT DETECTED Final    Streptococcus pneumoniae NOT DETECTED NOT DETECTED Final   Streptococcus pyogenes NOT DETECTED NOT DETECTED Final   Acinetobacter baumannii NOT DETECTED NOT DETECTED Final   Enterobacteriaceae species NOT DETECTED NOT DETECTED Final   Enterobacter cloacae complex NOT DETECTED NOT DETECTED Final   Escherichia coli NOT DETECTED NOT DETECTED Final   Klebsiella oxytoca NOT DETECTED NOT DETECTED Final   Klebsiella pneumoniae NOT DETECTED NOT  DETECTED Final   Proteus species NOT DETECTED NOT DETECTED Final   Serratia marcescens NOT DETECTED NOT DETECTED Final   Haemophilus influenzae NOT DETECTED NOT DETECTED Final   Neisseria meningitidis NOT DETECTED NOT DETECTED Final   Pseudomonas aeruginosa NOT DETECTED NOT DETECTED Final   Candida albicans NOT DETECTED NOT DETECTED Final   Candida glabrata NOT DETECTED NOT DETECTED Final   Candida krusei NOT DETECTED NOT DETECTED Final   Candida parapsilosis NOT DETECTED NOT DETECTED Final   Candida tropicalis NOT DETECTED NOT DETECTED Final    Comment: Performed at [facility], [place], [place] [address]  SARS CORONAVIRUS 2 (TAT 6-24 HRS) Nasopharyngeal Nasopharyngeal Swab     Status: None   Collection Time: 11/08/19  6:00 PM   Specimen: Nasopharyngeal Swab  Result Value Ref Range Status   SARS Coronavirus 2 NEGATIVE NEGATIVE Final    Comment: (NOTE) SARS-CoV-2 target nucleic acids are NOT DETECTED. The SARS-CoV-2 RNA is generally detectable in upper and lower respiratory specimens during the acute phase of infection. Negative results do not preclude SARS-CoV-2 infection, do not rule out co-infections with other pathogens, and should not be used as the sole basis for treatment or other patient management decisions. Negative results must be combined with clinical observations, patient history, and epidemiological information. The expected result is Negative. Fact Sheet for  Patients: SugarRoll.be Fact Sheet for Healthcare Providers: https://www.woods-mathews.com/ This test is not yet approved or cleared by the [place] FDA and  has been authorized for detection and/or diagnosis of SARS-CoV-2 by FDA under an Emergency Use Authorization (EUA). This EUA will remain  in effect (meaning this test can be used) for the duration of the COVID-19 declaration under Section 56 4(b)(1) of the Act, 21 U.S.C. section 360bbb-3(b)(1), unless the authorization is terminated or revoked sooner. Performed at [facility], [place] [address], [place], [place] [address]   MRSA PCR Screening     Status: None   Collection Time: 11/09/19  9:41 AM   Specimen: Nasal Mucosa; Nasopharyngeal  Result Value Ref Range Status   MRSA by PCR NEGATIVE NEGATIVE Final    Comment:        The GeneXpert MRSA Assay (FDA approved for NASAL specimens only), is one component of a comprehensive MRSA colonization surveillance program. It is not intended to diagnose MRSA infection nor to guide or monitor treatment for MRSA infections. Performed at [facility], [address] [place], [place] [address]   CULTURE, BLOOD (ROUTINE X 2) w Reflex to ID Panel     Status: None (Preliminary result)   Collection Time: 11/10/19  4:15 PM   Specimen: BLOOD  Result Value Ref Range Status   Specimen Description BLOOD RIGHT ANTECUBITAL  Final   Special Requests   Final    BOTTLES DRAWN AEROBIC AND ANAEROBIC Blood Culture adequate volume   Culture   Final    NO GROWTH < 24 HOURS Performed at [facility], [address], [place], [place] [address]    Report Status PENDING  Incomplete  CULTURE, BLOOD (ROUTINE X 2) w Reflex to ID Panel     Status: None (Preliminary result)   Collection Time: 11/10/19  5:46 PM   Specimen: BLOOD  Result Value Ref Range Status   Specimen Description BLOOD BLOOD LEFT HAND  Final   Special Requests   Final     BOTTLES DRAWN AEROBIC ONLY Blood Culture results may not be optimal due to an inadequate volume of  blood received in culture bottles   Culture   Final    NO GROWTH < 24 HOURS Performed at [facility], [address] [place], [place] [address]    Report Status PENDING  Incomplete    IMAGING RESULTS: CXR- no infiltrate I have personally reviewed the films ? Impression/Recommendation ? ?Fever and sob- likely related to rituximab infusion Has resolved pretty quickly with no evidence clinically of pneumonia, UTI, cellulitis The staph epidermidis  in the blood and urine are contaminants as both are accompanied by another bacteria and both staph have different susceptibility He is currently on cefazolin ( day 4 of antibiotic) He will not need any on discharge  Hairy cell leukemia on rituxan and cladiribine  Neutropenia- combination of hairy leukemia and chemo- stable now  Thrombocytopenia/leucopenia and anemia due to the above  Also has high FDP/low fibrinogen, INR and APTT mildly elevated concerning for DIC  Discussed the management with care team. ID will sign off- call if needed   ?

## 2019-11-11 NOTE — Progress Notes (Addendum)
PROGRESS NOTE    Frank Mcdaniel  [id number] DOB: Jul 13, 1933 DOA: 11/08/2019 PCP: [name], MD      Assessment & Plan:   Active Problems:   Neutropenic fever (HCC)   Neutropeninc fever: etiology unclear, possibly bacteremia & UTI. S/p Rotuxin infusion on 4/5 for recurrent hairy cell leukemia vs transfusion reaction vs infection. Blood cultures positive for staphylococcus epidermidis, often a contaminate but pt is immunosuppressed & pancytopenic. Repeat blood cxs NGTD. Urine culture growing stap epidermidis. Continue with empirically w/ IV cefazolin. Echo ordered. ID consulted. Afebrile for 48 hours. Covid19 negative. Unclear if decompensation was related to transfusion reaction or other etiology.  Will hold treatment until etiology can be determined as per onco. Onco recs apprec. Continue decadron 4 mg BID as per onco. Will follow up back in cancer center for further evaluation upon discharge .  Possible bacteremia: blood cx growing staph epidermidis, often a contaminate but pt is immunosuppressed & pancytopenic. Continue IV cefazolin. Repeat blood cxs NGTD. Echo ordered. ID consulted   UTI: urine cx is growing stap epidermidis as well. Continue on IV abxs  Pancytopenia: likely secondary to recent chemo. 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 Will hold further treatment as per onco. Onco recs apprec   CKDIIIa: Cr is labile. Will continue to monitor   Hypokalemia: KCl repleted. Will continue to monitor   HTN: hold meds for now as BP on low end of normal   CAD: hold asa due to low plt. Continue statin. Continue to hold imdur  BPH: continue flomax  Alzheimers: w/ behavioral disturbance. Continue on aricept. Continue w/ sitter  DVT prophylaxis: SCDs secondary to pancytopenia Code Status: full  Family Communication: discussed pt's care w/ pt's son who is at bedside and answered his questions    Disposition Plan: depends on PT/OT recs    Consultants:   onco   Procedures:    Antimicrobials: cefazolin   Subjective: Pt is lethargic. Pt denies any pain.  Objective: Vitals:   11/10/19 1626 11/10/19 1946 11/10/19 2349 11/11/19 0422  BP: 132/81 113/62 (!) 146/90 (!) 128/52  Pulse: (!) 46 64  60  Resp:  17 18 17   Temp: 97.7 F (36.5 C)  97.8 F (36.6 C) 97.7 F (36.5 C)  TempSrc: Oral  Oral Oral  SpO2: 100% 100%  (!) 87%  Weight:      Height:        Intake/Output Summary (Last 24 hours) at 11/11/2019 0750 Last data filed at 11/10/2019 2244 Gross per 24 hour  Intake 972.28 ml  Output 725 ml  Net 247.28 ml   Filed Weights   11/08/19 1632  Weight: 76.4 kg    Examination:  General exam: Appears lethargic Respiratory system: decreased breath sounds b/l. Cardiovascular system: S1 & S2 +. No rubs, gallops or clicks.  Gastrointestinal system: Abdomen is nondistended, soft and NT. Hypoactive bowel sounds heard. Central nervous system: Alert and oriented to person. Moves all 4 extremities  Psychiatry: Judgement and insight appear abnormal.     Data Reviewed: I have personally reviewed following labs and imaging studies  CBC: Recent Labs  Lab 11/08/19 0755 11/08/19 1642 11/09/19 0441 11/10/19 0406 11/11/19 0542  WBC 1.5* 0.2* 3.4* 3.2* 1.5*  NEUTROABS 1.0* 0.1*  --   --   --   HGB 10.4* 11.4* 10.0* 9.6* 9.8*  HCT 30.7* 34.1* 29.4* 28.3* 28.2*  MCV 100.0 100.6* 99.3 98.6 96.9  PLT 77* 24* 22* 33* 34*   Basic Metabolic Panel: Recent Labs  Lab 11/08/19 0755 11/08/19 1642 11/09/19 0441 11/10/19 0406 11/11/19 0542  NA 142 142 140 140 139  K 3.8 3.7 4.0 4.0 3.2*  CL 110 110 111 110 111  CO2 25 23 21* 23 24  GLUCOSE 113* 106* 141* 133* 87  BUN 18 20 20  29* 23  CREATININE 1.60* 1.48* 1.40* 1.31* 1.15  CALCIUM 9.1 9.0 8.5* 8.8* 8.2*   GFR: Estimated Creatinine Clearance: 47 mL/min (by C-G formula based on SCr of 1.15 mg/dL). Liver Function  Tests: Recent Labs  Lab 11/08/19 1642 11/11/19 0542  AST 62* 23  ALT 21 12  ALKPHOS 70 43  BILITOT 1.1 0.7  PROT 6.6 5.3*  ALBUMIN 4.2 3.3*   No results for input(s): LIPASE, AMYLASE in the last 168 hours. No results for input(s): AMMONIA in the last 168 hours. Coagulation Profile: Recent Labs  Lab 11/08/19 1642  INR 1.7*   Cardiac Enzymes: No results for input(s): CKTOTAL, CKMB, CKMBINDEX, TROPONINI in the last 168 hours. BNP (last 3 results) No results for input(s): PROBNP in the last 8760 hours. HbA1C: No results for input(s): HGBA1C in the last 72 hours. CBG: Recent Labs  Lab 11/09/19 0949  GLUCAP 137*   Lipid Profile: No results for input(s): CHOL, HDL, LDLCALC, TRIG, CHOLHDL, LDLDIRECT in the last 72 hours. Thyroid Function Tests: No results for input(s): TSH, T4TOTAL, FREET4, T3FREE, THYROIDAB in the last 72 hours. Anemia Panel: No results for input(s): VITAMINB12, FOLATE, FERRITIN, TIBC, IRON, RETICCTPCT in the last 72 hours. Sepsis Labs: Recent Labs  Lab 11/08/19 1642 11/08/19 2326  PROCALCITON 1.90  --   LATICACIDVEN 3.4* 1.7    Recent Results (from the past 240 hour(s))  Blood Culture (routine x 2)     Status: Abnormal (Preliminary result)   Collection Time: 11/08/19  4:43 PM   Specimen: BLOOD  Result Value Ref Range Status   Specimen Description   Final    BLOOD LAC Performed at [facility], [address], [place], [place] [address]    Special Requests   Final    BOTTLES DRAWN AEROBIC AND ANAEROBIC Blood Culture results may not be optimal due to an excessive volume of blood received in culture bottles Performed at [facility], [address] [place], [place] [address]    Culture  Setup Time   Final    Organism ID to follow [place] TO, READ BACK BY AND VERIFIED WITH: WALID NAZARI 11/09/19 @ 1445  [place] CRITICAL RESULT CALLED TO, READ BACK BY  AND VERIFIED WITH: DAVID PATEL @0045  11/11/19 AKT GRAM POSITIVE RODS Performed at [facility], [address] [place], [place] [address]    Culture (A)  Final    STAPHYLOCOCCUS EPIDERMIDIS CULTURE REINCUBATED FOR BETTER GROWTH Performed at [facility], [place] [address], [place], [place] [address]    Report Status PENDING  Incomplete   Organism ID, Bacteria STAPHYLOCOCCUS EPIDERMIDIS  Final      Susceptibility   Staphylococcus epidermidis - MIC*    CIPROFLOXACIN <=0.5 SENSITIVE Sensitive     ERYTHROMYCIN >=8 RESISTANT Resistant     GENTAMICIN <=0.5 SENSITIVE Sensitive     OXACILLIN <=0.25 SENSITIVE Sensitive     TETRACYCLINE 2 SENSITIVE Sensitive     VANCOMYCIN <=0.5 SENSITIVE Sensitive     TRIMETH/SULFA <=10 SENSITIVE Sensitive     CLINDAMYCIN <=0.25 SENSITIVE  Sensitive     RIFAMPIN <=0.5 SENSITIVE Sensitive     Inducible Clindamycin NEGATIVE Sensitive     * STAPHYLOCOCCUS EPIDERMIDIS  Urine culture     Status: Abnormal (Preliminary result)   Collection Time: 11/08/19  4:43 PM   Specimen: In/Out Cath Urine  Result Value Ref Range Status   Specimen Description   Final    IN/OUT CATH URINE Performed at [facility], [address] [place], [place] [address]    Special Requests   Final    NONE Performed at [facility], [address], [place], [place] [address]    Culture (A)  Final    10,000 COLONIES/mL STAPHYLOCOCCUS EPIDERMIDIS CULTURE REINCUBATED FOR BETTER GROWTH Performed at [facility], [place] [address], [place], [place] [address]    Report Status PENDING  Incomplete  Blood Culture ID Panel (Reflexed)     Status: Abnormal   Collection Time: 11/08/19  4:43 PM  Result Value Ref Range Status   Enterococcus species NOT DETECTED NOT DETECTED Final   Listeria monocytogenes NOT DETECTED NOT DETECTED Final   Staphylococcus species DETECTED (A) NOT DETECTED Final    Comment: Methicillin (oxacillin) susceptible coagulase negative  staphylococcus. Possible blood culture contaminant (unless isolated from more than one blood culture draw or clinical case suggests pathogenicity). No antibiotic treatment is indicated for blood  culture contaminants. CRITICAL RESULT CALLED TO, READ BACK BY AND VERIFIED WITH: WALID NAZARI 11/09/19 @ [id number]  [place]    Staphylococcus aureus (BCID) NOT DETECTED NOT DETECTED Final   Methicillin resistance NOT DETECTED NOT DETECTED Final   Streptococcus species NOT DETECTED NOT DETECTED Final   Streptococcus agalactiae NOT DETECTED NOT DETECTED Final   Streptococcus pneumoniae NOT DETECTED NOT DETECTED Final   Streptococcus pyogenes NOT DETECTED NOT DETECTED Final   Acinetobacter baumannii NOT DETECTED NOT DETECTED Final   Enterobacteriaceae species NOT DETECTED NOT DETECTED Final   Enterobacter cloacae complex NOT DETECTED NOT DETECTED Final   Escherichia coli NOT DETECTED NOT DETECTED Final   Klebsiella oxytoca NOT DETECTED NOT DETECTED Final   Klebsiella pneumoniae NOT DETECTED NOT DETECTED Final   Proteus species NOT DETECTED NOT DETECTED Final   Serratia marcescens NOT DETECTED NOT DETECTED Final   Haemophilus influenzae NOT DETECTED NOT DETECTED Final   Neisseria meningitidis NOT DETECTED NOT DETECTED Final   Pseudomonas aeruginosa NOT DETECTED NOT DETECTED Final   Candida albicans NOT DETECTED NOT DETECTED Final   Candida glabrata NOT DETECTED NOT DETECTED Final   Candida krusei NOT DETECTED NOT DETECTED Final   Candida parapsilosis NOT DETECTED NOT DETECTED Final   Candida tropicalis NOT DETECTED NOT DETECTED Final    Comment: Performed at [facility], [place], [place] [address]  SARS CORONAVIRUS 2 (TAT 6-24 HRS) Nasopharyngeal Nasopharyngeal Swab     Status: None   Collection Time: 11/08/19  6:00 PM   Specimen: Nasopharyngeal Swab  Result Value Ref Range Status   SARS Coronavirus 2 NEGATIVE NEGATIVE Final    Comment: (NOTE) SARS-CoV-2 target nucleic acids are  NOT DETECTED. The SARS-CoV-2 RNA is generally detectable in upper and lower respiratory specimens during the acute phase of infection. Negative results do not preclude SARS-CoV-2 infection, do not rule out co-infections with other pathogens, and should not be used as the sole basis for treatment or other patient management decisions. Negative results must be combined with clinical observations, patient history, and epidemiological information. The expected result is Negative. Fact Sheet for Patients: SugarRoll.be Fact Sheet  for Healthcare Providers: https://www.woods-mathews.com/ This test is not yet approved or cleared by the [place] and  has been authorized for detection and/or diagnosis of SARS-CoV-2 by FDA under an Emergency Use Authorization (EUA). This EUA will remain  in effect (meaning this test can be used) for the duration of the COVID-19 declaration under Section 56 4(b)(1) of the Act, 21 U.S.C. section 360bbb-3(b)(1), unless the authorization is terminated or revoked sooner. Performed at [facility], [place] [address], [place], [place] [address]   MRSA PCR Screening     Status: None   Collection Time: 11/09/19  9:41 AM   Specimen: Nasal Mucosa; Nasopharyngeal  Result Value Ref Range Status   MRSA by PCR NEGATIVE NEGATIVE Final    Comment:        The GeneXpert MRSA Assay (FDA approved for NASAL specimens only), is one component of a comprehensive MRSA colonization surveillance program. It is not intended to diagnose MRSA infection nor to guide or monitor treatment for MRSA infections. Performed at [facility], [address] [place], [place] [address]   CULTURE, BLOOD (ROUTINE X 2) w Reflex to ID Panel     Status: None (Preliminary result)   Collection Time: 11/10/19  4:15 PM   Specimen: BLOOD  Result Value Ref Range Status   Specimen Description BLOOD RIGHT ANTECUBITAL  Final   Special Requests    Final    BOTTLES DRAWN AEROBIC AND ANAEROBIC Blood Culture adequate volume   Culture   Final    NO GROWTH < 24 HOURS Performed at [facility], [address] [place], [place] [address]    Report Status PENDING  Incomplete  CULTURE, BLOOD (ROUTINE X 2) w Reflex to ID Panel     Status: None (Preliminary result)   Collection Time: 11/10/19  5:46 PM   Specimen: BLOOD  Result Value Ref Range Status   Specimen Description BLOOD BLOOD LEFT HAND  Final   Special Requests   Final    BOTTLES DRAWN AEROBIC ONLY Blood Culture results may not be optimal due to an inadequate volume of blood received in culture bottles   Culture   Final    NO GROWTH < 24 HOURS Performed at [facility], [address], [place], [place] [address]    Report Status PENDING  Incomplete         Radiology Studies: No results found.      Scheduled Meds: . calcium carbonate  1 tablet Oral BID  . Chlorhexidine Gluconate Cloth  6 each Topical Daily  . dexamethasone  4 mg Oral Q12H  . donepezil  10 mg Oral QHS  . famotidine  20 mg Oral Daily  . mouth rinse  15 mL Mouth Rinse BID  . multivitamin with minerals  1 tablet Oral Daily  . tamsulosin  0.4 mg Oral QPC breakfast   Continuous Infusions: . sodium chloride 50 mL/hr at 11/11/19 0205  . sodium chloride    .  ceFAZolin (ANCEF) IV 2 g (11/11/19 [id number])     LOS: 3 days    Time spent: 37 mins     Wyvonnia Dusky, MD [facility] Pager [phone]  If 7PM-7AM, please contact night-coverage [email or web address] 11/11/2019, 7:50 AM

## 2019-11-11 NOTE — Progress Notes (Signed)
PHARMACY - PHYSICIAN COMMUNICATION CRITICAL VALUE ALERT - BLOOD CULTURE IDENTIFICATION (BCID)  Frank Mcdaniel is an 86 y.o. male who presented to [facility] on 11/08/2019 with a chief complaint of shaking and weakness w/ h/o recurrent hairy cell leukemia  Assessment:  WBC 3.4 >> 3.2, 4/5: 1/4 [facility] BCID Staph epidermidis MSSE the second bottle (which is why I was called) resulted as GPR no BCID. 4/7: bottles still pending. Antibiotics were narrowed to cefazolin.  Name of physician (or Provider) Contacted: N/A  Current antibiotics: Cefazolin 2g IV q8h  Changes to prescribed antibiotics recommended:  Patient is on recommended antibiotics - No changes needed  Results for orders placed or performed during the hospital encounter of 11/08/19  Blood Culture ID Panel (Reflexed) (Collected: 11/08/2019  4:43 PM)  Result Value Ref Range   Enterococcus species NOT DETECTED NOT DETECTED   Listeria monocytogenes NOT DETECTED NOT DETECTED   Staphylococcus species DETECTED (A) NOT DETECTED   Staphylococcus aureus (BCID) NOT DETECTED NOT DETECTED   Methicillin resistance NOT DETECTED NOT DETECTED   Streptococcus species NOT DETECTED NOT DETECTED   Streptococcus agalactiae NOT DETECTED NOT DETECTED   Streptococcus pneumoniae NOT DETECTED NOT DETECTED   Streptococcus pyogenes NOT DETECTED NOT DETECTED   Acinetobacter baumannii NOT DETECTED NOT DETECTED   Enterobacteriaceae species NOT DETECTED NOT DETECTED   Enterobacter cloacae complex NOT DETECTED NOT DETECTED   Escherichia coli NOT DETECTED NOT DETECTED   Klebsiella oxytoca NOT DETECTED NOT DETECTED   Klebsiella pneumoniae NOT DETECTED NOT DETECTED   Proteus species NOT DETECTED NOT DETECTED   Serratia marcescens NOT DETECTED NOT DETECTED   Haemophilus influenzae NOT DETECTED NOT DETECTED   Neisseria meningitidis NOT DETECTED NOT DETECTED   Pseudomonas aeruginosa NOT DETECTED NOT DETECTED   Candida albicans NOT DETECTED NOT DETECTED   Candida  glabrata NOT DETECTED NOT DETECTED   Candida krusei NOT DETECTED NOT DETECTED   Candida parapsilosis NOT DETECTED NOT DETECTED   Candida tropicalis NOT DETECTED NOT DETECTED   Tobie Lords, PharmD, BCPS Clinical Pharmacist 11/11/2019  1:56 AM

## 2019-11-11 NOTE — Progress Notes (Signed)
Pt combative, agitated and hitting staff. Un able to redirect pt. MD paged.

## 2019-11-11 NOTE — Consult Note (Signed)
[place]  [phone] [phone]  ID: Frank Mcdaniel OB: 16-Dec-1932  MR#: [id number]  [id number]  Patient Care Team: [name], MD as PCP - General (Family Medicine) Rockey Situ Kathlene November, MD as Consulting Physician (Cardiology) [name], MD as Consulting Physician (Oncology) [name] as Physician Assistant (Urology)  CHIEF COMPLAINT: Hairy cell leukemia, shortness of breath, DIC.  INTERVAL HISTORY: Patient remains confused, but improved.  Son at bedside.  REVIEW OF SYSTEMS:   Review of Systems  Unable to perform ROS: Medical condition    PAST MEDICAL HISTORY: Past Medical History:  Diagnosis Date  . Anemia 09/01/2015  . Anxiety 09/11/2015  . Arthritis   . Asthma    as a teenager  . Asymptomatic Sinus Bradycardia   . Atypical chest pain    a. 08/2014  . BPH (benign prostatic hyperplasia)   . CKD (chronic kidney disease), stage III   . Closed fracture of tuft of distal phalanx of finger 08/31/2018  . Coronary artery disease    a. 2000 s/p CABG;  b. 10/2008 Neg MV, EF 64%; c. cath 05/2015: LM 85% sev cal, pLAD-1 lesion 80%, pLAD-2 lesion 100% chronic, ostLCx 70% sev cal, OM4 90%, ostRCA 70%, mid RCA 90%, LIMA-LAD patent, VG-OM2 patent, VG-OM3 patent, VG-RPDA patent. No AS  . Dementia without behavioral disturbance ([place]) 05/28/2019  . GERD (gastroesophageal reflux disease)   . H/O echocardiogram    a. 10/2005 Echo: nl EF.  [name] Hairy cell leukemia ([place])   . HOH (hard of hearing)    Bilateral hearing aids  . HTN (hypertension)   . Hyperlipidemia   . Hypertension   . Leukemia ([place])   . Stricture and stenosis of esophagus     PAST SURGICAL HISTORY: Past Surgical History:  Procedure Laterality Date  . CARDIAC CATHETERIZATION N/A 05/26/2015   Procedure: Left Heart Cath;  Surgeon: [name], MD;  Location: [place] [facility];  Service: Cardiovascular;  Laterality: N/A;  . CARDIAC  CATHETERIZATION N/A 05/26/2015   Procedure: Coronary/Graft Angiography;  Surgeon: [name], MD;  Location: [place] [facility];  Service: Cardiovascular;  Laterality: N/A;  . CATARACT EXTRACTION    . CORONARY ARTERY BYPASS GRAFT  2000  . ESOPHAGOGASTRODUODENOSCOPY N/A 02/18/2017   Procedure: ESOPHAGOGASTRODUODENOSCOPY (EGD) with removal of food bolus;  Surgeon: [name], MD;  Location: [facility] ENDOSCOPY;  Service: Endoscopy;  Laterality: N/A;  . ESOPHAGOGASTRODUODENOSCOPY (EGD) WITH PROPOFOL N/A 03/11/2017   Procedure: ESOPHAGOGASTRODUODENOSCOPY (EGD) WITH PROPOFOL;  Surgeon: [name], MD;  Location: [facility] ENDOSCOPY;  Service: Endoscopy;  Laterality: N/A;  . ESOPHAGOGASTRODUODENOSCOPY (EGD) WITH PROPOFOL N/A 04/15/2017   Procedure: ESOPHAGOGASTRODUODENOSCOPY (EGD) WITH PROPOFOL;  Surgeon: [name], MD;  Location: [facility] ENDOSCOPY;  Service: Endoscopy;  Laterality: N/A;  . EYE SURGERY Left    Cataract Extraction with IOL  . INGUINAL HERNIA REPAIR Left 06/24/2016   Procedure: HERNIA REPAIR INGUINAL ADULT;  Surgeon: [name], MD;  Location: [facility] ORS;  Service: General;  Laterality: Left;  . TONSILLECTOMY  1942   . UPPER GASTROINTESTINAL ENDOSCOPY  3 years ago    with Dilation.     FAMILY HISTORY: Family History  Problem Relation Age of Onset  . Hypertension Mother   . Arthritis Father   . Other Other        no premature CAD.  [name] Colon cancer Neg Hx   . Prostate cancer Neg Hx   . Stomach cancer Neg Hx  ADVANCED DIRECTIVES (Y/N):  @ADVDIR @  HEALTH MAINTENANCE: Social History   Tobacco Use  . Smoking status: Never Smoker  . Smokeless tobacco: Never Used  Substance Use Topics  . Alcohol use: No  . Drug use: No     Colonoscopy:  PAP:  Bone density:  Lipid panel:  No Known Allergies  Current Facility-Administered Medications  Medication Dose Route Frequency Provider Last Rate Last Admin  . 0.9 %  sodium chloride infusion   Intravenous Continuous  Nolberto Hanlon, MD 50 mL/hr at 11/11/19 0205 New Bag at 11/11/19 0205  . 0.9 %  sodium chloride infusion   Intravenous PRN Nolberto Hanlon, MD      . acetaminophen (TYLENOL) tablet 650 mg  650 mg Oral Q6H PRN Nolberto Hanlon, MD       Or  . acetaminophen (TYLENOL) suppository 650 mg  650 mg Rectal Q6H PRN Nolberto Hanlon, MD      . calcium carbonate (TUMS - dosed in mg elemental calcium) chewable tablet 200 mg of elemental calcium  1 tablet Oral BID Nolberto Hanlon, MD   200 mg of elemental calcium at 11/11/19 0940  . ceFAZolin (ANCEF) IVPB 2g/100 mL premix  2 g Intravenous Q8H Wyvonnia Dusky, MD 200 mL/hr at 11/11/19 1356 2 g at 11/11/19 1356  . Chlorhexidine Gluconate Cloth 2 % PADS 6 each  6 each Topical Daily Nolberto Hanlon, MD   6 each at 11/11/19 1605  . dexamethasone (DECADRON) tablet 4 mg  4 mg Oral Q12H [name], MD   4 mg at 11/11/19 0941  . donepezil (ARICEPT) tablet 10 mg  10 mg Oral QHS Nolberto Hanlon, MD   10 mg at 11/09/19 2030  . famotidine (PEPCID) tablet 20 mg  20 mg Oral Daily Nolberto Hanlon, MD   20 mg at 11/11/19 0940  . MEDLINE mouth rinse  15 mL Mouth Rinse BID Nolberto Hanlon, MD   15 mL at 11/11/19 0941  . multivitamin with minerals tablet 1 tablet  1 tablet Oral Daily Nolberto Hanlon, MD   1 tablet at 11/11/19 0940  . ondansetron (ZOFRAN) tablet 8 mg  8 mg Oral BID PRN Nolberto Hanlon, MD      . polyethylene glycol (MIRALAX / GLYCOLAX) packet 17 g  17 g Oral Daily PRN Nolberto Hanlon, MD      . prochlorperazine (COMPAZINE) tablet 10 mg  10 mg Oral Q6H PRN Nolberto Hanlon, MD      . tamsulosin (FLOMAX) capsule 0.4 mg  0.4 mg Oral QPC breakfast Nolberto Hanlon, MD   0.4 mg at 11/11/19 0940    OBJECTIVE: Vitals:   11/11/19 1241 11/11/19 1631  BP: (!) 110/53 110/67  Pulse: (!) 55 (!) 52  Resp: 16 18  Temp: 97.6 F (36.4 C) 98.3 F (36.8 C)  SpO2: 99% 98%     Body mass index is 24.87 kg/m.    ECOG FS:4 - Bedbound  General: Ill-appearing, no acute distress. Eyes: Pink conjunctiva,  anicteric sclera. HEENT: Normocephalic, moist mucous membranes. Lungs: No audible wheezing or coughing. Heart: Regular rate and rhythm. Abdomen: Soft, nontender, no obvious distention. Musculoskeletal: No edema, cyanosis, or clubbing. Neuro: Confused. Cranial nerves grossly intact. Skin: No rashes or petechiae noted. Psych: Normal affect.   LAB RESULTS:  Lab Results  Component Value Date   NA 139 11/11/2019   K 3.2 (L) 11/11/2019   CL 111 11/11/2019   CO2 24 11/11/2019   GLUCOSE 87 11/11/2019   BUN 23 11/11/2019  CREATININE 1.15 11/11/2019   CALCIUM 8.2 (L) 11/11/2019   PROT 5.3 (L) 11/11/2019   ALBUMIN 3.3 (L) 11/11/2019   AST 23 11/11/2019   ALT 12 11/11/2019   ALKPHOS 43 11/11/2019   BILITOT 0.7 11/11/2019   GFRNONAA 58 (L) 11/11/2019   GFRAA >60 11/11/2019    Lab Results  Component Value Date   WBC 1.5 (L) 11/11/2019   NEUTROABS 0.1 (L) 11/08/2019   HGB 9.8 (L) 11/11/2019   HCT 28.2 (L) 11/11/2019   MCV 96.9 11/11/2019   PLT 34 (L) 11/11/2019     STUDIES: CT Angio Chest PE W and/or Wo Contrast  Result Date: 11/08/2019 CLINICAL DATA:  Hypoxia, fever EXAM: CT ANGIOGRAPHY CHEST WITH CONTRAST TECHNIQUE: Multidetector CT imaging of the chest was performed using the standard protocol during bolus administration of intravenous contrast. Multiplanar CT image reconstructions and MIPs were obtained to evaluate the vascular anatomy. CONTRAST:  19mL OMNIPAQUE IOHEXOL 350 MG/ML SOLN COMPARISON:  01/13/2015 FINDINGS: Cardiovascular: No filling defects in the pulmonary arteries to suggest pulmonary emboli. Heart is normal size. Prior CABG. Aortic atherosclerosis. No aneurysm. Mediastinum/Nodes: No mediastinal, hilar, or axillary adenopathy. Small hiatal hernia. Esophagus mildly distended with gas and fluid may be related to reflux. Distal esophageal wall appears mildly thickened, possibly related to reflux esophagitis. Lungs/Pleura: Lungs are clear. No focal airspace opacities or  suspicious nodules. No effusions. Upper Abdomen: Gallstone noted with fluid in the gallbladder, stable. No acute findings. Musculoskeletal: Chest wall soft tissues are unremarkable. No acute bony abnormality. Review of the MIP images confirms the above findings. IMPRESSION: No evidence of pulmonary embolus. Prior CABG. Small hiatal hernia. Distal esophageal wall thickening and mild distention of the esophagus with air and fluid. Findings may be related to reflux. Aortic Atherosclerosis (ICD10-I70.0). Electronically Signed   By: Rolm Baptise M.D.   On: 11/08/2019 17:46   DG Chest Port 1 View  Result Date: 11/08/2019 CLINICAL DATA:  Shortness of breath EXAM: PORTABLE CHEST 1 VIEW COMPARISON:  04/10/2016 FINDINGS: Prior CABG. Heart is normal size. Low lung volumes. No confluent opacities or effusions. No acute bony abnormality. IMPRESSION: Low lung volumes.  No acute cardiopulmonary disease. Electronically Signed   By: Rolm Baptise M.D.   On: 11/08/2019 17:24    ASSESSMENT: Hairy cell leukemia, shortness of breath, DIC  PLAN:    1.  Hairy cell leukemia: Patient noted to have recurrent disease and reinitiated treatment with Rituxan and cladribine on Monday.  His decompensation earlier this week appears to be unrelated to his malignancy or his treatment.  We will hold chemotherapy at this time until his acute issues resolved.  Can continue Decadron 4 mg twice daily.  Will arrange follow-up in the cancer center upon discharge. 2.  Shortness of breath: Significantly improved.    Patient did not have an oxygen requirement prior to this week.  Plan to wean off oxygen over the next 1 to 2 days. 3.  DIC: Unclear etiology.  Platelets only marginally improved to 34.  Monitor. 4.  Leukopenia: White blood cell count initially improved likely secondary to steroids.  Current white blood cell count of 1.5 is approximately patient's baseline. 5.  Thrombocytopenia: Multifactorial.  Patient's baseline was approximately 77  prior to initiating treatment.  Acute decrease in platelets was likely secondary to DIC. 6.  Confusion: Patient was independent living at home with his son prior to this week.  Possibly iatrogenic.  Can consider MRI in the next 1 to 2 days if there is no  improvement.  Will follow.  [name], MD   11/11/2019 4:47 PM

## 2019-11-11 NOTE — Progress Notes (Signed)
Attempted to give pt his night medication. Pt hitting, kicking and refusing to open his mouth.

## 2019-11-12 ENCOUNTER — Inpatient Hospital Stay: Payer: PPO

## 2019-11-12 ENCOUNTER — Inpatient Hospital Stay (HOSPITAL_COMMUNITY)
Admit: 2019-11-12 | Discharge: 2019-11-12 | Disposition: A | Discharge status: Home / Self Care | Payer: PPO | Attending: Internal Medicine | Admitting: Internal Medicine

## 2019-11-12 DIAGNOSIS — I340 Nonrheumatic mitral (valve) insufficiency: Secondary | ICD-10-CM

## 2019-11-12 DIAGNOSIS — I361 Nonrheumatic tricuspid (valve) insufficiency: Secondary | ICD-10-CM

## 2019-11-12 DIAGNOSIS — R7881 Bacteremia: Secondary | ICD-10-CM

## 2019-11-12 DIAGNOSIS — D65 Disseminated intravascular coagulation [defibrination syndrome]: Secondary | ICD-10-CM

## 2019-11-12 LAB — CBC WITH DIFFERENTIAL/PLATELET
Abs Immature Granulocytes: 0.01 10*3/uL (ref 0.00–0.07)
Basophils Absolute: 0 10*3/uL (ref 0.0–0.1)
Basophils Relative: 0 %
Eosinophils Absolute: 0 10*3/uL (ref 0.0–0.5)
Eosinophils Relative: 0 %
HCT: 29.2 % — ABNORMAL LOW (ref 39.0–52.0)
Hemoglobin: 10 g/dL — ABNORMAL LOW (ref 13.0–17.0)
Immature Granulocytes: 0 %
Lymphocytes Relative: 3 %
Lymphs Abs: 0.1 10*3/uL — ABNORMAL LOW (ref 0.7–4.0)
MCH: 33.2 pg (ref 26.0–34.0)
MCHC: 34.2 g/dL (ref 30.0–36.0)
MCV: 97 fL (ref 80.0–100.0)
Monocytes Absolute: 0 10*3/uL — ABNORMAL LOW (ref 0.1–1.0)
Monocytes Relative: 0 %
Neutro Abs: 2.5 10*3/uL (ref 1.7–7.7)
Neutrophils Relative %: 97 %
Platelets: 48 10*3/uL — ABNORMAL LOW (ref 150–400)
RBC: 3.01 MIL/uL — ABNORMAL LOW (ref 4.22–5.81)
RDW: 15.2 % (ref 11.5–15.5)
WBC: 2.6 10*3/uL — ABNORMAL LOW (ref 4.0–10.5)
nRBC: 0 % (ref 0.0–0.2)

## 2019-11-12 LAB — CBC
HCT: 29.3 % — ABNORMAL LOW (ref 39.0–52.0)
Hemoglobin: 10 g/dL — ABNORMAL LOW (ref 13.0–17.0)
MCH: 32.9 pg (ref 26.0–34.0)
MCHC: 34.1 g/dL (ref 30.0–36.0)
MCV: 96.4 fL (ref 80.0–100.0)
Platelets: 46 10*3/uL — ABNORMAL LOW (ref 150–400)
RBC: 3.04 MIL/uL — ABNORMAL LOW (ref 4.22–5.81)
RDW: 15 % (ref 11.5–15.5)
WBC: 2.5 10*3/uL — ABNORMAL LOW (ref 4.0–10.5)
nRBC: 0 % (ref 0.0–0.2)

## 2019-11-12 LAB — COMPREHENSIVE METABOLIC PANEL
ALT: 17 U/L (ref 0–44)
AST: 30 U/L (ref 15–41)
Albumin: 3.3 g/dL — ABNORMAL LOW (ref 3.5–5.0)
Alkaline Phosphatase: 50 U/L (ref 38–126)
Anion gap: 8 (ref 5–15)
BUN: 29 mg/dL — ABNORMAL HIGH (ref 8–23)
CO2: 21 mmol/L — ABNORMAL LOW (ref 22–32)
Calcium: 8.5 mg/dL — ABNORMAL LOW (ref 8.9–10.3)
Chloride: 108 mmol/L (ref 98–111)
Creatinine, Ser: 1.24 mg/dL (ref 0.61–1.24)
GFR calc Af Amer: >60 mL/min (ref >60)
GFR calc non Af Amer: 53 mL/min — ABNORMAL LOW (ref >60)
Glucose, Bld: 135 mg/dL — ABNORMAL HIGH (ref 70–99)
Potassium: 4 mmol/L (ref 3.5–5.1)
Sodium: 137 mmol/L (ref 135–145)
Total Bilirubin: 0.8 mg/dL (ref 0.3–1.2)
Total Protein: 5.7 g/dL — ABNORMAL LOW (ref 6.5–8.1)

## 2019-11-12 LAB — ECHOCARDIOGRAM COMPLETE
Height: 69 in
Weight: 2694.9 oz

## 2019-11-12 MED ORDER — DEXAMETHASONE 4 MG PO TABS: 4.0000 mg | ORAL_TABLET | Freq: Two times a day (BID) | ORAL | 0 refills | Status: AC

## 2019-11-12 MED ORDER — PERFLUTREN LIPID MICROSPHERE
1.0000 mL | INTRAVENOUS | Status: AC | PRN
  Administered 2019-11-12: 2 mL via INTRAVENOUS
  Filled 2019-11-12: qty 10

## 2019-11-12 NOTE — Evaluation (Signed)
Occupational Therapy Evaluation Patient Details Name: Frank Mcdaniel MRN: [id number] DOB: 08-13-1932 Today's Date: 11/12/2019    History of Present Illness admitted for acute hospitalization as direct admit from cancer center after developing fever, shakes after infusion.   Clinical Impression   Pt was seen for OT evaluation this date. Prior to hospital admission, pt was Indep with self care ADLs/ADL mobility. Pt lives with son in [facility] with basement laundry. Currently pt demonstrates impairments as described below (See OT problem list) which functionally limit his ability to perform ADL/self-care tasks. Pt currently requires Supv with no AD for standing ADLs/ADL mobility d/t slightly decreased (G-/F+ standing balance). Do not currently anticipate any acute OT needs or need for OT f/u, but do recommend intermittent supv for safety/fall prevention.     Follow Up Recommendations  No OT follow up;Supervision - Intermittent    Equipment Recommendations  None recommended by OT    Recommendations for Other Services       Precautions / Restrictions Precautions Precautions: Fall Restrictions Weight Bearing Restrictions: No      Mobility Bed Mobility Overal bed mobility: Modified Independent                Transfers Overall transfer level: Needs assistance Equipment used: None Transfers: Sit to/from Stand Sit to Stand: Supervision;Min guard         General transfer comment: Pt declins to use RW, but does demo one small/slight LOB in static standing. able to self correct without OT assitance, but would benefit from UE support with cane or walker if pt becomes agreeable.    Balance Overall balance assessment: Needs assistance Sitting-balance support: No upper extremity supported;Feet supported Sitting balance-Leahy Scale: Good     Standing balance support: No upper extremity supported Standing balance-Leahy Scale: Fair                             ADL  either performed or assessed with clinical judgement   ADL                                         General ADL Comments: Pt able to perform UB ADLs I'ly, LB ADLs/dynamic sitting ADLs/standing ADLs with SBA/supv     Vision Baseline Vision/History: Wears glasses Wears Glasses: Reading only Patient Visual Report: No change from baseline       Perception     Praxis      Pertinent Vitals/Pain Pain Assessment: No/denies pain     Hand Dominance     Extremity/Trunk Assessment Upper Extremity Assessment Upper Extremity Assessment: Overall WFL for tasks assessed   Lower Extremity Assessment Lower Extremity Assessment: Overall WFL for tasks assessed       Communication Communication Communication: HOH   Cognition Arousal/Alertness: Awake/alert Behavior During Therapy: WFL for tasks assessed/performed Overall Cognitive Status: Within Functional Limits for tasks assessed                                 General Comments: STM deficits noted, as patient often repeats self and requires re-orientation to hospital stay/plan during session. Not oriented to month w/o verbal cues, not oriented to president or situation. Oriented to self, year and place.   General Comments       Exercises Other Exercises Other Exercises: OT facilitates  education with pt and pt's son re: safety/fall prevention. Pt demos one small LOB in static standing which OT calls pt's son's attention to. While pt self-corrected and can currently manage w/o AD, anticiapte AD would be beneficial to consider. Other Exercises: External male catheter removed during session given noted ability to ambulate without assist device; patient, son and staff encouraged to participate with OOB to [facility] or toilet as appropriate for additional mobility opportunities during remaining hospital stay.  All voiced understanding and agreement.   Shoulder Instructions      Home Living Family/patient expects to  be discharged to:: Private residence Living Arrangements: Children(son) Available Help at Discharge: Family;Available PRN/intermittently(pt at home alone while son at work) Type of Home: House Home Access: Stairs to enter [employer] of Steps: 3 from mail level; full flight from [place]: Laundry or work area in basement;Two level               Home Equipment: None          Prior Functioning/Environment Level of Independence: Independent        Comments: Indep with household/community mobilization; denies fall history.  Participates/assists with household chores and responsbilities as able; enjoys 'walking around the block' when able        OT Problem List: Impaired balance (sitting and/or standing);Decreased cognition;Decreased safety awareness      OT Treatment/Interventions: Self-care/ADL training;Therapeutic exercise;Therapeutic activities;Balance training;Patient/family education    OT Goals(Current goals can be found in the care plan section) Acute Rehab OT Goals Patient Stated Goal: to go home OT Goal Formulation: All assessment and education complete, DC therapy  OT Frequency: Min 2X/week   Barriers to D/C:            Co-evaluation              AM-PAC OT "6 Clicks" Daily Activity     Outcome Measure Help from another person eating meals?: None Help from another person taking care of personal grooming?: None Help from another person toileting, which includes using toliet, bedpan, or urinal?: A Little Help from another person bathing (including washing, rinsing, drying)?: A Little Help from another person to put on and taking off regular upper body clothing?: None Help from another person to put on and taking off regular lower body clothing?: A Little 6 Click Score: 21   End of Session Equipment Utilized During Treatment: Gait belt Nurse Communication: Mobility status  Activity Tolerance: Patient tolerated  treatment well Patient left: Other (comment)(sitting EOB with son present in room, anticipating d/c)  OT Visit Diagnosis: Unsteadiness on feet (R26.81)                Time: [id number] OT Time Calculation (min): 24 min Charges:  OT General Charges $OT Visit: 1 Visit OT Evaluation $OT Eval Low Complexity: 1 Low OT Treatments $Self Care/Home Management : 8-22 mins  Gerrianne Scale, MS, OTR/L ascom [phone] 11/12/19, 5:13 PM

## 2019-11-12 NOTE — Progress Notes (Signed)
*  PRELIMINARY RESULTS* Echocardiogram 2D Echocardiogram has been performed.  Frank Mcdaniel 11/12/2019, 9:43 AM

## 2019-11-12 NOTE — TOC Transition Note (Signed)
Transition of Care [facility]) - CM/SW Discharge Note   Patient Details  Name: Frank Mcdaniel MRN: [id number] Date of Birth: 12-26-1932  [facility] CM/SW Contact:  Su Hilt, RN Phone Number: 11/12/2019, 4:19 PM   Clinical Narrative:    The patient will DC home with [facility] services set up with [facility] they will set the patient up to be seen on Tuesday, that is the earliest they can see the patient. Will DC with his son whom he lives with     Barriers to Discharge: Continued Medical Work up   Patient Goals and CMS Choice        Discharge Placement                       Discharge Plan and Services                                     Social Determinants of Health (SDOH) Interventions     Readmission Risk Interventions Readmission Risk Prevention Plan 11/10/2019  Transportation Screening Complete  PCP or Specialist Appt within 3-5 Days Complete  HRI or Home Care Consult Complete  Medication Review (RN Care Manager) Complete  Some recent data might be hidden

## 2019-11-12 NOTE — Discharge Summary (Signed)
Physician Discharge Summary  Frank Mcdaniel [id number] DOB: 1933/07/29 DOA: 11/08/2019  PCP: [name], MD  Admit date: 11/08/2019 Discharge date: 11/12/2019  Admitted From: home Disposition: home w/ home health   Recommendations for Outpatient Follow-up:  1. Follow up with PCP in 1-2 weeks 2. F/u onco, Dr. [name], in 3-4 days  Home Health: yes Equipment/Devices:  Discharge Condition: stable CODE STATUS: full  Diet recommendation: regular  Brief/Interim Summary: HPI was taken from Dr. Kurtis Bushman: Frank Mcdaniel is a 86 y.o. male with medical history significant of recurrent hairy cell leukemia, asthma, arthritis, chronic kidney disease stage III, was at the chemo infusion center receiving Rituxan.  When he was leaving the son and the nurse noticed patient was shaking and weak.  Son who right me with a history stated at that time they checked his temperature and he was febrile the number is unknown.  He was brought straight to the ED for further evaluation.  ED Course: In the ED his temp is 101, blood pressure currently 134/69, 107.  Respiratory rate 28.  Per ER attending he was found to be hypoxic which improved to the 90s with 2 L.  Rapid Covid was negative.  The other test for SARS Covid is pending.  On cefepime.  X-ray was unremarkable for any acute cardiopulmonary disease.  The chest was negative for PE, please see full results.  Creatinine is 1.48, BUN 20 bicarb 23 lactic acid 3.4 troponin XIX procalcitonin 1.90 WBC 0.2 hemoglobin 11.4 hematocrit 34.1 platelets 24 Oncology Dr. [name] has been made aware of the patient's admission.  Hospital Course from Dr. Lenise Herald 4/7-11/12/19: Pt presented w/ neutropenic fever of unknown etiology, rotuxin infusion vs infection vs transfusion reaction. Pt was started on IV abxs. Pt was found to have possible bacteremia growing staph epidermidis which is often a contaminate but pt is immunosuppressed and pancytopenic so abxs were  continued. Urine cx also grew stap epidermidis as well. ID was consulted and felt like the blood cxs and urine cxs were contaminate and rec no abxs on d/c. Of note, pt was found to be in DIC and onco recommended to continue to monitor. Pt's platelets were trending up on d/c to 48, baseline around 77 as per onco. Pt was started on decadron 4mg  BID as per onco and pt was d/c w/ a week supply of this. Pt will f/u w/ Dr. [name] in 3-4 days as an outpatient. Please see previous progress notes for more information.    Discharge Diagnoses:  Active Problems:   Neutropenic fever (HCC) Neutropeninc fever: etiology unclear, possibly bacteremia & UTI. S/p Rotuxin infusionon 4/5 for recurrent hairy cell leukemia vs transfusion reaction vs infection. Blood culturespositive for staphylococcus epidermidis, often a contaminate but pt is immunosuppressed & pancytopenic. Repeat blood cxs NGTD. Urine culture growing stap epidermidis. Continue withempirically w/ IV cefazolin. Echo pending. ID recs apprec. Afebrile recently. Covid19negative. Unclear if decompensation was related to transfusion reaction or other etiology. Will hold treatment until etiology can be determined as per onco. Onco recs apprec. Continue decadron 4 mg BID as per onco. Will follow up back in cancer center for further evaluation upon discharge .  Possible bacteremia: blood cx growing staph epidermidis, often a contaminate but pt is immunosuppressed & pancytopenic. Continue IV cefazolin. Repeat blood cxs NGTD. No abxs on d/c as per ID. Echo pending. ID recs apprec  Possible UTI: urine cx is growing stap epidermidis as well. Continue on IV abxs. No abxs on d/c as  per ID.  Pancytopenia: likely secondary to recent chemo. No need for a transfusion at this time   DIC: etiology unclear. Platelets are trending up today. Ok for pt d/c as per onco today. 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 Will hold  further treatment as per onco. Onco recs apprec   CKDIIIa: Cr is labile. Will continue to monitor   Hypokalemia: WNL today. Will continue to monitor   HTN: hold meds for now as BP on low end of normal   CAD: hold asa due to low plt. Continue statin. Continue to hold imdur  BPH: continue flomax  Alzheimers: w/ behavioral disturbance. Continue on aricept. D/c sitter  Generalized weakness: PT/OT recs home health    Discharge Instructions  Discharge Instructions    Diet general   Complete by: As directed    Discharge instructions   Complete by: As directed    F/U PCP in 1 week; F/u onco (Dr. [name]) in 3-4 days   Increase activity slowly   Complete by: As directed      Allergies as of 11/12/2019   No Known Allergies     Medication List    STOP taking these medications   aspirin 81 MG EC tablet     TAKE these medications   atorvastatin 40 MG tablet Commonly known as: LIPITOR TAKE 1/2 TABLETS (20 MG TOTAL) BY MOUTH DAILY.   Clinpro 5000 1.1 % Pste Generic drug: Sodium Fluoride APPLY PEASIZED AMOUNT, BRUSH DAILY AT LEAST 2 MINUTES. SPIT AFTER USE   dexamethasone 4 MG tablet Commonly known as: DECADRON Take 1 tablet (4 mg total) by mouth every 12 (twelve) hours for 7 days.   donepezil 10 MG tablet Commonly known as: ARICEPT Take 10 mg by mouth at bedtime.   isosorbide mononitrate 30 MG 24 hr tablet Commonly known as: IMDUR TAKE 1 TABLET BY MOUTH EVERY DAY   multivitamin tablet Take 1 tablet by mouth daily.   ondansetron 8 MG tablet Commonly known as: Zofran Take 1 tablet (8 mg total) by mouth 2 (two) times daily as needed (Nausea or vomiting).   polyethylene glycol 17 g packet Commonly known as: MIRALAX / GLYCOLAX Take 17 g by mouth daily as needed.   prochlorperazine 10 MG tablet Commonly known as: COMPAZINE Take 1 tablet (10 mg total) by mouth every 6 (six) hours as needed (Nausea or vomiting).   tamsulosin 0.4 MG Caps capsule Commonly  known as: FLOMAX TAKE 1 CAPSULES (0.4 MG TOTAL) BY MOUTH DAILY.   TUMS CHEWY BITES PO Take 1 tablet by mouth as needed.      Follow-up Information    [name], MD Follow up.   Specialty: Family Medicine Why: Please schedule PCP appointment for within 5-7 days of discharge date. Contact information: [address]  [phone]          No Known Allergies  Consultations:  ID  onco   Procedures/Studies: CT Angio Chest PE W and/or Wo Contrast  Result Date: 11/08/2019 CLINICAL DATA:  Hypoxia, fever EXAM: CT ANGIOGRAPHY CHEST WITH CONTRAST TECHNIQUE: Multidetector CT imaging of the chest was performed using the standard protocol during bolus administration of intravenous contrast. Multiplanar CT image reconstructions and MIPs were obtained to evaluate the vascular anatomy. CONTRAST:  102mL OMNIPAQUE IOHEXOL 350 MG/ML SOLN COMPARISON:  01/13/2015 FINDINGS: Cardiovascular: No filling defects in the pulmonary arteries to suggest pulmonary emboli. Heart is normal size. Prior CABG. Aortic  atherosclerosis. No aneurysm. Mediastinum/Nodes: No mediastinal, hilar, or axillary adenopathy. Small hiatal hernia. Esophagus mildly distended with gas and fluid may be related to reflux. Distal esophageal wall appears mildly thickened, possibly related to reflux esophagitis. Lungs/Pleura: Lungs are clear. No focal airspace opacities or suspicious nodules. No effusions. Upper Abdomen: Gallstone noted with fluid in the gallbladder, stable. No acute findings. Musculoskeletal: Chest wall soft tissues are unremarkable. No acute bony abnormality. Review of the MIP images confirms the above findings. IMPRESSION: No evidence of pulmonary embolus. Prior CABG. Small hiatal hernia. Distal esophageal wall thickening and mild distention of the esophagus with air and fluid. Findings may be related to reflux. Aortic Atherosclerosis (ICD10-I70.0). Electronically Signed   By: Rolm Baptise M.D.   On: 11/08/2019 17:46   DG Chest Port 1 View  Result Date: 11/08/2019 CLINICAL DATA:  Shortness of breath EXAM: PORTABLE CHEST 1 VIEW COMPARISON:  04/10/2016 FINDINGS: Prior CABG. Heart is normal size. Low lung volumes. No confluent opacities or effusions. No acute bony abnormality. IMPRESSION: Low lung volumes.  No acute cardiopulmonary disease. Electronically Signed   By: Rolm Baptise M.D.   On: 11/08/2019 17:24    Subjective: pt c/o being hungry.    Discharge Exam: Vitals:   11/12/19 0829 11/12/19 1535  BP: (!) 148/69 132/75  Pulse: (!) 59 69  Resp:  18  Temp: 98.4 F (36.9 C) 98.1 F (36.7 C)  SpO2: 93% 97%   Vitals:   11/11/19 1631 11/11/19 1953 11/12/19 0829 11/12/19 1535  BP: 110/67 122/79 (!) 148/69 132/75  Pulse: (!) 52 65 (!) 59 69  Resp: 18 18  18   Temp: 98.3 F (36.8 C) 98.5 F (36.9 C) 98.4 F (36.9 C) 98.1 F (36.7 C)  TempSrc: Oral Oral Oral Oral  SpO2: 98% 97% 93% 97%  Weight:      Height:        General exam: Appears alert and awake  Respiratory system: decreased breath sounds b/l otherwise clear  Cardiovascular system: S1 & S2 +. No rubs, gallops or clicks.  Gastrointestinal system: Abdomen is nondistended, soft and NT. Normal bowel sounds heard. Central nervous system: Alert and oriented. Moves all 4 extremities  Psychiatry: Judgement and insight appear normal.     The results of significant diagnostics from this hospitalization (including imaging, microbiology, ancillary and laboratory) are listed below for reference.     Microbiology: Recent Results (from the past 240 hour(s))  Blood Culture (routine x 2)     Status: Abnormal (Preliminary result)   Collection Time: 11/08/19  4:43 PM   Specimen: BLOOD  Result Value Ref Range Status   Specimen Description   Final    BLOOD LAC Performed at [facility], [address] [place], [place] [address]    Special Requests   Final    BOTTLES DRAWN AEROBIC AND ANAEROBIC Blood  Culture results may not be optimal due to an excessive volume of blood received in culture bottles Performed at [facility], [address] [place], [place] [address]    Culture  Setup Time   Final    Organism ID to follow [place] CALLED TO, READ BACK BY AND VERIFIED WITH: WALID NAZARI 11/09/19 @ 1445  [place] CRITICAL RESULT CALLED TO, READ BACK BY AND VERIFIED WITH: DAVID PATEL @0045  11/11/19 AKT GRAM POSITIVE RODS Performed at [facility], [address] [place], [place] [address]    Culture STAPHYLOCOCCUS EPIDERMIDIS Lonell Grandchild  POSITIVE RODS  (A)  Final   Report Status PENDING  Incomplete   Organism ID, Bacteria STAPHYLOCOCCUS EPIDERMIDIS  Final      Susceptibility   Staphylococcus epidermidis - MIC*    CIPROFLOXACIN <=0.5 SENSITIVE Sensitive     ERYTHROMYCIN >=8 RESISTANT Resistant     GENTAMICIN <=0.5 SENSITIVE Sensitive     OXACILLIN <=0.25 SENSITIVE Sensitive     TETRACYCLINE 2 SENSITIVE Sensitive     VANCOMYCIN <=0.5 SENSITIVE Sensitive     TRIMETH/SULFA <=10 SENSITIVE Sensitive     CLINDAMYCIN <=0.25 SENSITIVE Sensitive     RIFAMPIN <=0.5 SENSITIVE Sensitive     Inducible Clindamycin NEGATIVE Sensitive     * STAPHYLOCOCCUS EPIDERMIDIS  Urine culture     Status: Abnormal   Collection Time: 11/08/19  4:43 PM   Specimen: In/Out Cath Urine  Result Value Ref Range Status   Specimen Description   Final    IN/OUT CATH URINE Performed at [facility], [address], [place], [place] [address]    Special Requests   Final    NONE Performed at [facility], [place], [place] [address]    Culture (A)  Final    10,000 COLONIES/mL STAPHYLOCOCCUS EPIDERMIDIS 7,000 COLONIES/mL AEROCOCCUS URINAE ORGANISM 2 Standardized susceptibility testing for this organism is not available. Performed at [facility], [place] [address] [place], [place] [address]     Report Status 11/11/2019 FINAL  Final   Organism ID, Bacteria STAPHYLOCOCCUS EPIDERMIDIS (A)  Final      Susceptibility   Staphylococcus epidermidis - MIC*    CIPROFLOXACIN <=0.5 SENSITIVE Sensitive     GENTAMICIN <=0.5 SENSITIVE Sensitive     NITROFURANTOIN <=16 SENSITIVE Sensitive     OXACILLIN <=0.25 SENSITIVE Sensitive     TETRACYCLINE <=1 SENSITIVE Sensitive     VANCOMYCIN 2 SENSITIVE Sensitive     TRIMETH/SULFA <=10 SENSITIVE Sensitive     CLINDAMYCIN 2 INTERMEDIATE Intermediate     RIFAMPIN <=0.5 SENSITIVE Sensitive     Inducible Clindamycin NEGATIVE Sensitive     * 10,000 COLONIES/mL STAPHYLOCOCCUS EPIDERMIDIS  Blood Culture ID Panel (Reflexed)     Status: Abnormal   Collection Time: 11/08/19  4:43 PM  Result Value Ref Range Status   Enterococcus species NOT DETECTED NOT DETECTED Final   Listeria monocytogenes NOT DETECTED NOT DETECTED Final   Staphylococcus species DETECTED (A) NOT DETECTED Final    Comment: Methicillin (oxacillin) susceptible coagulase negative staphylococcus. Possible blood culture contaminant (unless isolated from more than one blood culture draw or clinical case suggests pathogenicity). No antibiotic treatment is indicated for blood  culture contaminants. CRITICAL RESULT CALLED TO, READ BACK BY AND VERIFIED WITH: WALID NAZARI 11/09/19 @ [id number]  [place]    Staphylococcus aureus (BCID) NOT DETECTED NOT DETECTED Final   Methicillin resistance NOT DETECTED NOT DETECTED Final   Streptococcus species NOT DETECTED NOT DETECTED Final   Streptococcus agalactiae NOT DETECTED NOT DETECTED Final   Streptococcus pneumoniae NOT DETECTED NOT DETECTED Final   Streptococcus pyogenes NOT DETECTED NOT DETECTED Final   Acinetobacter baumannii NOT DETECTED NOT DETECTED Final   Enterobacteriaceae species NOT DETECTED NOT DETECTED Final   Enterobacter cloacae complex NOT DETECTED NOT DETECTED Final   Escherichia coli NOT DETECTED NOT DETECTED Final   Klebsiella oxytoca NOT DETECTED  NOT DETECTED Final   Klebsiella pneumoniae NOT DETECTED NOT DETECTED Final   Proteus species NOT DETECTED NOT DETECTED Final   Serratia marcescens NOT DETECTED NOT DETECTED Final  Haemophilus influenzae NOT DETECTED NOT DETECTED Final   Neisseria meningitidis NOT DETECTED NOT DETECTED Final   Pseudomonas aeruginosa NOT DETECTED NOT DETECTED Final   Candida albicans NOT DETECTED NOT DETECTED Final   Candida glabrata NOT DETECTED NOT DETECTED Final   Candida krusei NOT DETECTED NOT DETECTED Final   Candida parapsilosis NOT DETECTED NOT DETECTED Final   Candida tropicalis NOT DETECTED NOT DETECTED Final    Comment: Performed at [facility], [place], [place] [address]  SARS CORONAVIRUS 2 (TAT 6-24 HRS) Nasopharyngeal Nasopharyngeal Swab     Status: None   Collection Time: 11/08/19  6:00 PM   Specimen: Nasopharyngeal Swab  Result Value Ref Range Status   SARS Coronavirus 2 NEGATIVE NEGATIVE Final    Comment: (NOTE) SARS-CoV-2 target nucleic acids are NOT DETECTED. The SARS-CoV-2 RNA is generally detectable in upper and lower respiratory specimens during the acute phase of infection. Negative results do not preclude SARS-CoV-2 infection, do not rule out co-infections with other pathogens, and should not be used as the sole basis for treatment or other patient management decisions. Negative results must be combined with clinical observations, patient history, and epidemiological information. The expected result is Negative. Fact Sheet for Patients: SugarRoll.be Fact Sheet for Healthcare Providers: https://www.woods-mathews.com/ This test is not yet approved or cleared by the [place] FDA and  has been authorized for detection and/or diagnosis of SARS-CoV-2 by FDA under an Emergency Use Authorization (EUA). This EUA will remain  in effect (meaning this test can be used) for the duration of the COVID-19 declaration  under Section 56 4(b)(1) of the Act, 21 U.S.C. section 360bbb-3(b)(1), unless the authorization is terminated or revoked sooner. Performed at [facility], [place] [address], [place], [place] [address]   MRSA PCR Screening     Status: None   Collection Time: 11/09/19  9:41 AM   Specimen: Nasal Mucosa; Nasopharyngeal  Result Value Ref Range Status   MRSA by PCR NEGATIVE NEGATIVE Final    Comment:        The GeneXpert MRSA Assay (FDA approved for NASAL specimens only), is one component of a comprehensive MRSA colonization surveillance program. It is not intended to diagnose MRSA infection nor to guide or monitor treatment for MRSA infections. Performed at [facility], [address] [place], [place] [address]   CULTURE, BLOOD (ROUTINE X 2) w Reflex to ID Panel     Status: None (Preliminary result)   Collection Time: 11/10/19  4:15 PM   Specimen: BLOOD  Result Value Ref Range Status   Specimen Description BLOOD RIGHT ANTECUBITAL  Final   Special Requests   Final    BOTTLES DRAWN AEROBIC AND ANAEROBIC Blood Culture adequate volume   Culture   Final    NO GROWTH < 24 HOURS Performed at [facility], [address] [place], [place] [address]    Report Status PENDING  Incomplete  CULTURE, BLOOD (ROUTINE X 2) w Reflex to ID Panel     Status: None (Preliminary result)   Collection Time: 11/10/19  5:46 PM   Specimen: BLOOD  Result Value Ref Range Status   Specimen Description BLOOD BLOOD LEFT HAND  Final   Special Requests   Final    BOTTLES DRAWN AEROBIC ONLY Blood Culture results may not be optimal due to an inadequate volume of blood received in culture bottles   Culture   Final    NO GROWTH < 24 HOURS Performed at [facility]  Lab, [place], [place] [address]    Report Status PENDING  Incomplete     Labs: BNP (last 3 results) Recent Labs    11/08/19 1643  BNP [id number]   Basic Metabolic Panel: Recent Labs  Lab  11/08/19 1642 11/09/19 0441 11/10/19 0406 11/11/19 0542 11/12/19 0518  NA 142 140 140 139 137  K 3.7 4.0 4.0 3.2* 4.0  CL 110 111 110 111 108  CO2 23 21* 23 24 21*  GLUCOSE 106* 141* 133* 87 135*  BUN 20 20 29* 23 29*  CREATININE 1.48* 1.40* 1.31* 1.15 1.24  CALCIUM 9.0 8.5* 8.8* 8.2* 8.5*   Liver Function Tests: Recent Labs  Lab 11/08/19 1642 11/11/19 0542 11/12/19 0518  AST 62* 23 30  ALT 21 12 17   ALKPHOS 70 43 50  BILITOT 1.1 0.7 0.8  PROT 6.6 5.3* 5.7*  ALBUMIN 4.2 3.3* 3.3*   No results for input(s): LIPASE, AMYLASE in the last 168 hours. No results for input(s): AMMONIA in the last 168 hours. CBC: Recent Labs  Lab 11/08/19 0755 11/08/19 0755 11/08/19 1642 11/09/19 0441 11/10/19 0406 11/11/19 0542 11/12/19 0518  WBC 1.5*   < > 0.2* 3.4* 3.2* 1.5* 2.6*  2.5*  NEUTROABS 1.0*  --  0.1*  --   --   --  2.5  HGB 10.4*   < > 11.4* 10.0* 9.6* 9.8* 10.0*  10.0*  HCT 30.7*   < > 34.1* 29.4* 28.3* 28.2* 29.2*  29.3*  MCV 100.0   < > 100.6* 99.3 98.6 96.9 97.0  96.4  PLT 77*   < > 24* 22* 33* 34* 48*  46*   < > = values in this interval not displayed.   Cardiac Enzymes: No results for input(s): CKTOTAL, CKMB, CKMBINDEX, TROPONINI in the last 168 hours. BNP: Invalid input(s): POCBNP CBG: Recent Labs  Lab 11/09/19 0949  GLUCAP 137*   D-Dimer No results for input(s): DDIMER in the last 72 hours. Hgb A1c No results for input(s): HGBA1C in the last 72 hours. Lipid Profile No results for input(s): CHOL, HDL, LDLCALC, TRIG, CHOLHDL, LDLDIRECT in the last 72 hours. Thyroid function studies No results for input(s): TSH, T4TOTAL, T3FREE, THYROIDAB in the last 72 hours.  Invalid input(s): FREET3 Anemia work up No results for input(s): VITAMINB12, FOLATE, FERRITIN, TIBC, IRON, RETICCTPCT in the last 72 hours. Urinalysis    Component Value Date/Time   COLORURINE YELLOW (A) 11/08/2019 1643   APPEARANCEUR HAZY (A) 11/08/2019 1643   APPEARANCEUR Clear  07/16/2018 1452   LABSPEC 1.024 11/08/2019 1643   LABSPEC 1.013 09/01/2014 1142   PHURINE 5.0 11/08/2019 1643   GLUCOSEU NEGATIVE 11/08/2019 1643   GLUCOSEU Negative 09/01/2014 1142   HGBUR NEGATIVE 11/08/2019 1643   BILIRUBINUR NEGATIVE 11/08/2019 1643   BILIRUBINUR Negative 07/16/2018 1452   BILIRUBINUR Negative 09/01/2014 1142   KETONESUR NEGATIVE 11/08/2019 1643   PROTEINUR 30 (A) 11/08/2019 1643   UROBILINOGEN 0.2 12/21/2015 1132   NITRITE NEGATIVE 11/08/2019 1643   LEUKOCYTESUR NEGATIVE 11/08/2019 1643   LEUKOCYTESUR Negative 09/01/2014 1142   Sepsis Labs Invalid input(s): PROCALCITONIN,  WBC,  LACTICIDVEN Microbiology Recent Results (from the past 240 hour(s))  Blood Culture (routine x 2)     Status: Abnormal (Preliminary result)   Collection Time: 11/08/19  4:43 PM   Specimen: BLOOD  Result Value Ref Range Status   Specimen Description   Final    BLOOD LAC Performed at [facility], [address] [place], [place]  [address]    Special Requests   Final    BOTTLES DRAWN AEROBIC AND ANAEROBIC Blood Culture results may not be optimal due to an excessive volume of blood received in culture bottles Performed at [facility], [address] [place], [place] [address]    Culture  Setup Time   Final    Organism ID to follow [place] CRITICAL RESULT CALLED TO, READ BACK BY AND VERIFIED WITH: WALID NAZARI 11/09/19 @ [place] ONLY CRITICAL RESULT CALLED TO, READ BACK BY AND VERIFIED WITH: DAVID PATEL @0045  11/11/19 AKT GRAM POSITIVE RODS Performed at [facility], [address], [place], [place] [address]    Culture STAPHYLOCOCCUS EPIDERMIDIS GRAM POSITIVE RODS  (A)  Final   Report Status PENDING  Incomplete   Organism ID, Bacteria STAPHYLOCOCCUS EPIDERMIDIS  Final      Susceptibility   Staphylococcus epidermidis - MIC*    CIPROFLOXACIN <=0.5 SENSITIVE Sensitive     ERYTHROMYCIN >=8 RESISTANT  Resistant     GENTAMICIN <=0.5 SENSITIVE Sensitive     OXACILLIN <=0.25 SENSITIVE Sensitive     TETRACYCLINE 2 SENSITIVE Sensitive     VANCOMYCIN <=0.5 SENSITIVE Sensitive     TRIMETH/SULFA <=10 SENSITIVE Sensitive     CLINDAMYCIN <=0.25 SENSITIVE Sensitive     RIFAMPIN <=0.5 SENSITIVE Sensitive     Inducible Clindamycin NEGATIVE Sensitive     * STAPHYLOCOCCUS EPIDERMIDIS  Urine culture     Status: Abnormal   Collection Time: 11/08/19  4:43 PM   Specimen: In/Out Cath Urine  Result Value Ref Range Status   Specimen Description   Final    IN/OUT CATH URINE Performed at [facility], [address], [place], [place] [address]    Special Requests   Final    NONE Performed at [facility], [address], [place], [place] [address]    Culture (A)  Final    10,000 COLONIES/mL STAPHYLOCOCCUS EPIDERMIDIS 7,000 COLONIES/mL AEROCOCCUS URINAE ORGANISM 2 Standardized susceptibility testing for this organism is not available. Performed at [facility], [place] [address] [place], [place] [address]    Report Status 11/11/2019 FINAL  Final   Organism ID, Bacteria STAPHYLOCOCCUS EPIDERMIDIS (A)  Final      Susceptibility   Staphylococcus epidermidis - MIC*    CIPROFLOXACIN <=0.5 SENSITIVE Sensitive     GENTAMICIN <=0.5 SENSITIVE Sensitive     NITROFURANTOIN <=16 SENSITIVE Sensitive     OXACILLIN <=0.25 SENSITIVE Sensitive     TETRACYCLINE <=1 SENSITIVE Sensitive     VANCOMYCIN 2 SENSITIVE Sensitive     TRIMETH/SULFA <=10 SENSITIVE Sensitive     CLINDAMYCIN 2 INTERMEDIATE Intermediate     RIFAMPIN <=0.5 SENSITIVE Sensitive     Inducible Clindamycin NEGATIVE Sensitive     * 10,000 COLONIES/mL STAPHYLOCOCCUS EPIDERMIDIS  Blood Culture ID Panel (Reflexed)     Status: Abnormal   Collection Time: 11/08/19  4:43 PM  Result Value Ref Range Status   Enterococcus species NOT DETECTED NOT DETECTED Final   Listeria monocytogenes NOT DETECTED NOT DETECTED Final    Staphylococcus species DETECTED (A) NOT DETECTED Final    Comment: Methicillin (oxacillin) susceptible coagulase negative staphylococcus. Possible blood culture contaminant (unless isolated from more than one blood culture draw or clinical case suggests pathogenicity). No antibiotic treatment is indicated for blood  culture contaminants. CRITICAL RESULT CALLED TO, READ BACK BY AND VERIFIED WITH: WALID NAZARI 11/09/19 @ 1445  [place]    Staphylococcus  aureus (BCID) NOT DETECTED NOT DETECTED Final   Methicillin resistance NOT DETECTED NOT DETECTED Final   Streptococcus species NOT DETECTED NOT DETECTED Final   Streptococcus agalactiae NOT DETECTED NOT DETECTED Final   Streptococcus pneumoniae NOT DETECTED NOT DETECTED Final   Streptococcus pyogenes NOT DETECTED NOT DETECTED Final   Acinetobacter baumannii NOT DETECTED NOT DETECTED Final   Enterobacteriaceae species NOT DETECTED NOT DETECTED Final   Enterobacter cloacae complex NOT DETECTED NOT DETECTED Final   Escherichia coli NOT DETECTED NOT DETECTED Final   Klebsiella oxytoca NOT DETECTED NOT DETECTED Final   Klebsiella pneumoniae NOT DETECTED NOT DETECTED Final   Proteus species NOT DETECTED NOT DETECTED Final   Serratia marcescens NOT DETECTED NOT DETECTED Final   Haemophilus influenzae NOT DETECTED NOT DETECTED Final   Neisseria meningitidis NOT DETECTED NOT DETECTED Final   Pseudomonas aeruginosa NOT DETECTED NOT DETECTED Final   Candida albicans NOT DETECTED NOT DETECTED Final   Candida glabrata NOT DETECTED NOT DETECTED Final   Candida krusei NOT DETECTED NOT DETECTED Final   Candida parapsilosis NOT DETECTED NOT DETECTED Final   Candida tropicalis NOT DETECTED NOT DETECTED Final    Comment: Performed at [facility] [place], [place] [address]  SARS CORONAVIRUS 2 (TAT 6-24 HRS) Nasopharyngeal Nasopharyngeal Swab     Status: None   Collection Time: 11/08/19  6:00 PM   Specimen: Nasopharyngeal Swab  Result  Value Ref Range Status   SARS Coronavirus 2 NEGATIVE NEGATIVE Final    Comment: (NOTE) SARS-CoV-2 target nucleic acids are NOT DETECTED. The SARS-CoV-2 RNA is generally detectable in upper and lower respiratory specimens during the acute phase of infection. Negative results do not preclude SARS-CoV-2 infection, do not rule out co-infections with other pathogens, and should not be used as the sole basis for treatment or other patient management decisions. Negative results must be combined with clinical observations, patient history, and epidemiological information. The expected result is Negative. Fact Sheet for Patients: SugarRoll.be Fact Sheet for Healthcare Providers: https://www.woods-mathews.com/ This test is not yet approved or cleared by the [place] FDA and  has been authorized for detection and/or diagnosis of SARS-CoV-2 by FDA under an Emergency Use Authorization (EUA). This EUA will remain  in effect (meaning this test can be used) for the duration of the COVID-19 declaration under Section 56 4(b)(1) of the Act, 21 U.S.C. section 360bbb-3(b)(1), unless the authorization is terminated or revoked sooner. Performed at [facility], [place] [address], [place], [place] [address]   MRSA PCR Screening     Status: None   Collection Time: 11/09/19  9:41 AM   Specimen: Nasal Mucosa; Nasopharyngeal  Result Value Ref Range Status   MRSA by PCR NEGATIVE NEGATIVE Final    Comment:        The GeneXpert MRSA Assay (FDA approved for NASAL specimens only), is one component of a comprehensive MRSA colonization surveillance program. It is not intended to diagnose MRSA infection nor to guide or monitor treatment for MRSA infections. Performed at [facility], [address] [place], [place] [address]   CULTURE, BLOOD (ROUTINE X 2) w Reflex to ID Panel     Status: None (Preliminary result)   Collection Time: 11/10/19  4:15 PM    Specimen: BLOOD  Result Value Ref Range Status   Specimen Description BLOOD RIGHT ANTECUBITAL  Final   Special Requests   Final    BOTTLES DRAWN AEROBIC AND ANAEROBIC Blood Culture adequate volume   Culture  Final    NO GROWTH < 24 HOURS Performed at [facility], [address] [place], [place] [address]    Report Status PENDING  Incomplete  CULTURE, BLOOD (ROUTINE X 2) w Reflex to ID Panel     Status: None (Preliminary result)   Collection Time: 11/10/19  5:46 PM   Specimen: BLOOD  Result Value Ref Range Status   Specimen Description BLOOD BLOOD LEFT HAND  Final   Special Requests   Final    BOTTLES DRAWN AEROBIC ONLY Blood Culture results may not be optimal due to an inadequate volume of blood received in culture bottles   Culture   Final    NO GROWTH < 24 HOURS Performed at [facility], [address], [place], [place] [address]    Report Status PENDING  Incomplete     Time coordinating discharge: Over 30 minutes  SIGNED:   Wyvonnia Dusky, MD  [facility] 11/12/2019, 4:44 PM Pager   If 7PM-7AM, please contact night-coverage [email or web address]

## 2019-11-12 NOTE — Progress Notes (Addendum)
PROGRESS NOTE    Frank Mcdaniel  [id number] DOB: 1933-07-15 DOA: 11/08/2019 PCP: [name], MD      Assessment & Plan:   Active Problems:   Neutropenic fever (HCC)   Neutropeninc fever: etiology unclear, possibly bacteremia & UTI. S/p Rotuxin infusion on 4/5 for recurrent hairy cell leukemia vs transfusion reaction vs infection. Blood cultures positive for staphylococcus epidermidis, often a contaminate but pt is immunosuppressed & pancytopenic. Repeat blood cxs NGTD. Urine culture growing stap epidermidis. Continue with empirically w/ IV cefazolin. Echo pending. ID recs apprec. Afebrile recently. Covid19 negative. Unclear if decompensation was related to transfusion reaction or other etiology.  Will hold treatment until etiology can be determined as per onco. Onco recs apprec. Continue decadron 4 mg BID as per onco. Will follow up back in cancer center for further evaluation upon discharge .  Possible bacteremia: blood cx growing staph epidermidis, often a contaminate but pt is immunosuppressed & pancytopenic. Continue IV cefazolin. Repeat blood cxs NGTD. No abxs on d/c as per ID. Echo pending. ID recs apprec  Possible UTI: urine cx is growing stap epidermidis as well. Continue on IV abxs. No abxs on d/c as per ID.  Pancytopenia: likely secondary to recent chemo. No need for a transfusion at this time   DIC: etiology unclear. Platelets are trending up today. Ok for pt d/c as per onco today. 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 Will hold further treatment as per onco. Onco recs apprec   CKDIIIa: Cr is labile. Will continue to monitor   Hypokalemia: WNL today. Will continue to monitor   HTN: hold meds for now as BP on low end of normal   CAD: hold asa due to low plt. Continue statin. Continue to hold imdur  BPH: continue flomax  Alzheimers: w/ behavioral disturbance. Continue on aricept. D/c  sitter  Generalized weakness: PT/OT consulted and awaiting recs   DVT prophylaxis: SCDs secondary to pancytopenia Code Status: full  Family Communication: discussed pt's care w/ pt's son who is at bedside and answered his questions  Disposition Plan: depends on PT/OT recs but likely home w/ home health; unlikely any barriers to d/c   Consultants:   onco   Procedures:    Antimicrobials: cefazolin   Subjective: Pt c/o being hungry  Objective: Vitals:   11/11/19 0820 11/11/19 1241 11/11/19 1631 11/11/19 1953  BP: (!) 126/53 (!) 110/53 110/67 122/79  Pulse: (!) 51 (!) 55 (!) 52 65  Resp: 16 16 18 18   Temp: 98.3 F (36.8 C) 97.6 F (36.4 C) 98.3 F (36.8 C) 98.5 F (36.9 C)  TempSrc: Oral Oral Oral Oral  SpO2: 95% 99% 98% 97%  Weight:      Height:        Intake/Output Summary (Last 24 hours) at 11/12/2019 0735 Last data filed at 11/12/2019 0024 Gross per 24 hour  Intake 2090.13 ml  Output 1375 ml  Net 715.13 ml   Filed Weights   11/08/19 1632  Weight: 76.4 kg    Examination:  General exam: Appears awake and alert  Respiratory system: diminished breath sounds b/l otherwise clear. Cardiovascular system: S1 & S2 +. No rubs, gallops or clicks.  Gastrointestinal system: Abdomen is nondistended, soft and NT. Normal bowel sounds heard. Central nervous system: Alert and oriented. Moves all 4 extremities  Psychiatry: Judgement and insight appear normal.     Data Reviewed: I have personally reviewed following labs and imaging studies  CBC: Recent Labs  Lab 11/08/19 0755 11/08/19 0755 11/08/19 1642 11/09/19 0441 11/10/19 0406 11/11/19 0542 11/12/19 0518  WBC 1.5*   < > 0.2* 3.4* 3.2* 1.5* 2.5*  NEUTROABS 1.0*  --  0.1*  --   --   --   --   HGB 10.4*   < > 11.4* 10.0* 9.6* 9.8* 10.0*  HCT 30.7*   < > 34.1* 29.4* 28.3* 28.2* 29.3*  MCV 100.0   < > 100.6* 99.3 98.6 96.9 96.4  PLT 77*   < > 24* 22* 33* 34* 46*   < > = values in this interval not displayed.    Basic Metabolic Panel: Recent Labs  Lab 11/08/19 1642 11/09/19 0441 11/10/19 0406 11/11/19 0542 11/12/19 0518  NA 142 140 140 139 137  K 3.7 4.0 4.0 3.2* 4.0  CL 110 111 110 111 108  CO2 23 21* 23 24 21*  GLUCOSE 106* 141* 133* 87 135*  BUN 20 20 29* 23 29*  CREATININE 1.48* 1.40* 1.31* 1.15 1.24  CALCIUM 9.0 8.5* 8.8* 8.2* 8.5*   GFR: Estimated Creatinine Clearance: 43.6 mL/min (by C-G formula based on SCr of 1.24 mg/dL). Liver Function Tests: Recent Labs  Lab 11/08/19 1642 11/11/19 0542 11/12/19 0518  AST 62* 23 30  ALT 21 12 17   ALKPHOS 70 43 50  BILITOT 1.1 0.7 0.8  PROT 6.6 5.3* 5.7*  ALBUMIN 4.2 3.3* 3.3*   No results for input(s): LIPASE, AMYLASE in the last 168 hours. No results for input(s): AMMONIA in the last 168 hours. Coagulation Profile: Recent Labs  Lab 11/08/19 1642  INR 1.7*   Cardiac Enzymes: No results for input(s): CKTOTAL, CKMB, CKMBINDEX, TROPONINI in the last 168 hours. BNP (last 3 results) No results for input(s): PROBNP in the last 8760 hours. HbA1C: No results for input(s): HGBA1C in the last 72 hours. CBG: Recent Labs  Lab 11/09/19 0949  GLUCAP 137*   Lipid Profile: No results for input(s): CHOL, HDL, LDLCALC, TRIG, CHOLHDL, LDLDIRECT in the last 72 hours. Thyroid Function Tests: No results for input(s): TSH, T4TOTAL, FREET4, T3FREE, THYROIDAB in the last 72 hours. Anemia Panel: No results for input(s): VITAMINB12, FOLATE, FERRITIN, TIBC, IRON, RETICCTPCT in the last 72 hours. Sepsis Labs: Recent Labs  Lab 11/08/19 1642 11/08/19 2326  PROCALCITON 1.90  --   LATICACIDVEN 3.4* 1.7    Recent Results (from the past 240 hour(s))  Blood Culture (routine x 2)     Status: Abnormal (Preliminary result)   Collection Time: 11/08/19  4:43 PM   Specimen: BLOOD  Result Value Ref Range Status   Specimen Description   Final    BLOOD LAC Performed at [facility], [address] [place], [place] [address]    Special  Requests   Final    BOTTLES DRAWN AEROBIC AND ANAEROBIC Blood Culture results may not be optimal due to an excessive volume of blood received in culture bottles Performed at [facility], [address] [place], [place] [address]    Culture  Setup Time   Final    Organism ID to follow [place] TO, READ BACK BY AND VERIFIED WITH: WALID NAZARI 11/09/19 @ 1445  [place] CRITICAL RESULT CALLED TO, READ BACK BY AND VERIFIED WITH: DAVID PATEL @0045  11/11/19 AKT GRAM POSITIVE RODS Performed at [facility], [address] [place], [place] [address]    Culture (A)  Final  STAPHYLOCOCCUS EPIDERMIDIS CULTURE REINCUBATED FOR BETTER GROWTH Performed at [facility], [place] [address], [place], [place] [address]    Report Status PENDING  Incomplete   Organism ID, Bacteria STAPHYLOCOCCUS EPIDERMIDIS  Final      Susceptibility   Staphylococcus epidermidis - MIC*    CIPROFLOXACIN <=0.5 SENSITIVE Sensitive     ERYTHROMYCIN >=8 RESISTANT Resistant     GENTAMICIN <=0.5 SENSITIVE Sensitive     OXACILLIN <=0.25 SENSITIVE Sensitive     TETRACYCLINE 2 SENSITIVE Sensitive     VANCOMYCIN <=0.5 SENSITIVE Sensitive     TRIMETH/SULFA <=10 SENSITIVE Sensitive     CLINDAMYCIN <=0.25 SENSITIVE Sensitive     RIFAMPIN <=0.5 SENSITIVE Sensitive     Inducible Clindamycin NEGATIVE Sensitive     * STAPHYLOCOCCUS EPIDERMIDIS  Urine culture     Status: Abnormal   Collection Time: 11/08/19  4:43 PM   Specimen: In/Out Cath Urine  Result Value Ref Range Status   Specimen Description   Final    IN/OUT CATH URINE Performed at [facility], [address], [place], [place] [address]    Special Requests   Final    NONE Performed at [facility], [address] [place], [place] [address]    Culture (A)  Final    10,000 COLONIES/mL STAPHYLOCOCCUS EPIDERMIDIS 7,000 COLONIES/mL AEROCOCCUS  URINAE ORGANISM 2 Standardized susceptibility testing for this organism is not available. Performed at [facility], [place] [address], [place],  [address]    Report Status 11/11/2019 FINAL  Final   Organism ID, Bacteria STAPHYLOCOCCUS EPIDERMIDIS (A)  Final      Susceptibility   Staphylococcus epidermidis - MIC*    CIPROFLOXACIN <=0.5 SENSITIVE Sensitive     GENTAMICIN <=0.5 SENSITIVE Sensitive     NITROFURANTOIN <=16 SENSITIVE Sensitive     OXACILLIN <=0.25 SENSITIVE Sensitive     TETRACYCLINE <=1 SENSITIVE Sensitive     VANCOMYCIN 2 SENSITIVE Sensitive     TRIMETH/SULFA <=10 SENSITIVE Sensitive     CLINDAMYCIN 2 INTERMEDIATE Intermediate     RIFAMPIN <=0.5 SENSITIVE Sensitive     Inducible Clindamycin NEGATIVE Sensitive     * 10,000 COLONIES/mL STAPHYLOCOCCUS EPIDERMIDIS  Blood Culture ID Panel (Reflexed)     Status: Abnormal   Collection Time: 11/08/19  4:43 PM  Result Value Ref Range Status   Enterococcus species NOT DETECTED NOT DETECTED Final   Listeria monocytogenes NOT DETECTED NOT DETECTED Final   Staphylococcus species DETECTED (A) NOT DETECTED Final    Comment: Methicillin (oxacillin) susceptible coagulase negative staphylococcus. Possible blood culture contaminant (unless isolated from more than one blood culture draw or clinical case suggests pathogenicity). No antibiotic treatment is indicated for blood  culture contaminants. CRITICAL RESULT CALLED TO, READ BACK BY AND VERIFIED WITH: WALID NAZARI 11/09/19 @ [id number]  [place]    Staphylococcus aureus (BCID) NOT DETECTED NOT DETECTED Final   Methicillin resistance NOT DETECTED NOT DETECTED Final   Streptococcus species NOT DETECTED NOT DETECTED Final   Streptococcus agalactiae NOT DETECTED NOT DETECTED Final   Streptococcus pneumoniae NOT DETECTED NOT DETECTED Final   Streptococcus pyogenes NOT DETECTED NOT DETECTED Final   Acinetobacter baumannii NOT DETECTED NOT DETECTED Final   Enterobacteriaceae species NOT  DETECTED NOT DETECTED Final   Enterobacter cloacae complex NOT DETECTED NOT DETECTED Final   Escherichia coli NOT DETECTED NOT DETECTED Final   Klebsiella oxytoca NOT DETECTED NOT DETECTED Final   Klebsiella pneumoniae NOT DETECTED NOT DETECTED Final   Proteus species NOT  DETECTED NOT DETECTED Final   Serratia marcescens NOT DETECTED NOT DETECTED Final   Haemophilus influenzae NOT DETECTED NOT DETECTED Final   Neisseria meningitidis NOT DETECTED NOT DETECTED Final   Pseudomonas aeruginosa NOT DETECTED NOT DETECTED Final   Candida albicans NOT DETECTED NOT DETECTED Final   Candida glabrata NOT DETECTED NOT DETECTED Final   Candida krusei NOT DETECTED NOT DETECTED Final   Candida parapsilosis NOT DETECTED NOT DETECTED Final   Candida tropicalis NOT DETECTED NOT DETECTED Final    Comment: Performed at [facility], [place], [place] [address]  SARS CORONAVIRUS 2 (TAT 6-24 HRS) Nasopharyngeal Nasopharyngeal Swab     Status: None   Collection Time: 11/08/19  6:00 PM   Specimen: Nasopharyngeal Swab  Result Value Ref Range Status   SARS Coronavirus 2 NEGATIVE NEGATIVE Final    Comment: (NOTE) SARS-CoV-2 target nucleic acids are NOT DETECTED. The SARS-CoV-2 RNA is generally detectable in upper and lower respiratory specimens during the acute phase of infection. Negative results do not preclude SARS-CoV-2 infection, do not rule out co-infections with other pathogens, and should not be used as the sole basis for treatment or other patient management decisions. Negative results must be combined with clinical observations, patient history, and epidemiological information. The expected result is Negative. Fact Sheet for Patients: SugarRoll.be Fact Sheet for Healthcare Providers: https://www.woods-mathews.com/ This test is not yet approved or cleared by the [place] FDA and  has been authorized for detection and/or diagnosis  of SARS-CoV-2 by FDA under an Emergency Use Authorization (EUA). This EUA will remain  in effect (meaning this test can be used) for the duration of the COVID-19 declaration under Section 56 4(b)(1) of the Act, 21 U.S.C. section 360bbb-3(b)(1), unless the authorization is terminated or revoked sooner. Performed at [facility], [place] [address], [place], [place] [address]   MRSA PCR Screening     Status: None   Collection Time: 11/09/19  9:41 AM   Specimen: Nasal Mucosa; Nasopharyngeal  Result Value Ref Range Status   MRSA by PCR NEGATIVE NEGATIVE Final    Comment:        The GeneXpert MRSA Assay (FDA approved for NASAL specimens only), is one component of a comprehensive MRSA colonization surveillance program. It is not intended to diagnose MRSA infection nor to guide or monitor treatment for MRSA infections. Performed at [facility], [address] [place], [place] [address]   CULTURE, BLOOD (ROUTINE X 2) w Reflex to ID Panel     Status: None (Preliminary result)   Collection Time: 11/10/19  4:15 PM   Specimen: BLOOD  Result Value Ref Range Status   Specimen Description BLOOD RIGHT ANTECUBITAL  Final   Special Requests   Final    BOTTLES DRAWN AEROBIC AND ANAEROBIC Blood Culture adequate volume   Culture   Final    NO GROWTH < 24 HOURS Performed at [facility], [address], [place], [place] [address]    Report Status PENDING  Incomplete  CULTURE, BLOOD (ROUTINE X 2) w Reflex to ID Panel     Status: None (Preliminary result)   Collection Time: 11/10/19  5:46 PM   Specimen: BLOOD  Result Value Ref Range Status   Specimen Description BLOOD BLOOD LEFT HAND  Final   Special Requests   Final    BOTTLES DRAWN AEROBIC ONLY Blood Culture results may not be optimal due to an inadequate volume of blood received in culture bottles   Culture  Final    NO GROWTH < 24 HOURS Performed at [facility], [address], [place], [place]  [address]    Report Status PENDING  Incomplete         Radiology Studies: No results found.      Scheduled Meds: . calcium carbonate  1 tablet Oral BID  . Chlorhexidine Gluconate Cloth  6 each Topical Daily  . dexamethasone  4 mg Oral Q12H  . donepezil  10 mg Oral QHS  . famotidine  20 mg Oral Daily  . mouth rinse  15 mL Mouth Rinse BID  . multivitamin with minerals  1 tablet Oral Daily  . tamsulosin  0.4 mg Oral QPC breakfast   Continuous Infusions: . sodium chloride 50 mL/hr at 11/11/19 2359  . sodium chloride    .  ceFAZolin (ANCEF) IV 2 g (11/12/19 [id number])     LOS: 4 days    Time spent: 33 mins     Wyvonnia Dusky, MD [facility] Pager [phone]  If 7PM-7AM, please contact night-coverage [email or web address] 11/12/2019, 7:35 AM

## 2019-11-12 NOTE — Evaluation (Signed)
Physical Therapy Evaluation Patient Details Name: Frank Mcdaniel MRN: [id number] DOB: 05/01/1933 Today's Date: 11/12/2019   History of Present Illness  admitted for acute hospitalization as direct admit from cancer center after developing fever, shakes after infusion.  Clinical Impression  Upon evaluation, patient alert and oriented to self, location; does require frequent reorientation and cuing for recall of new information.  Bilat UE/LE strength and ROM grossly symmetrical and WFL; no focal weakness appreciated. Able to complete bed mobility with mod indep; sit/stand, basic transfers and gait (400') without assist device, cga/close sup.  Demonstrates reciprocal stepping pattern with increased sway/weight shift to L intermittently, but able to self-correct with LE step strategy. Mild deviation with additioanl dynamic gait components, indicative of higher-level balance deficits, cga for safety; firmly declines trial with SPC at this time. Would benefit from skilled PT to address above deficits and promote optimal return to PLOF.; Recommend transition to HHPT upon discharge from acute hospitalization.   Follow Up Recommendations Home health PT    Equipment Recommendations       Recommendations for Other Services       Precautions / Restrictions Precautions Precautions: Fall Restrictions Weight Bearing Restrictions: No      Mobility  Bed Mobility Overal bed mobility: Modified Independent                Transfers Overall transfer level: Needs assistance Equipment used: None Transfers: Sit to/from Stand Sit to Stand: Supervision;Min guard         General transfer comment: uses UE support to stabilize as needed  Ambulation/Gait Ambulation/Gait assistance: Supervision;Min guard Gait Distance (Feet): 400 Feet Assistive device: None       General Gait Details: reciprocal stepping pattern with increased sway/weight shift to L intermittently, but able to self-correct  with LE step strategy. Mild deviation with additioanl dynamic gait components, indicative of higher-level balance deficits, cga for safety; firmly declines trial with SPC.  Stairs            Wheelchair Mobility    Modified Rankin (Stroke Patients Only)       Balance Overall balance assessment: Needs assistance Sitting-balance support: No upper extremity supported;Feet supported Sitting balance-Leahy Scale: Good     Standing balance support: No upper extremity supported Standing balance-Leahy Scale: Fair                               Pertinent Vitals/Pain Pain Assessment: No/denies pain    Home Living Family/patient expects to be discharged to:: Private residence Living Arrangements: Children Available Help at Discharge: Family;Available PRN/intermittently(son works outside of the home; patient alone while son works) Type of Home: [employer] Access: Stairs to enter   [employer] of Steps: 3 from [place]; full flight from [place]: Laundry or work area in basement;Two level Home Equipment: None      Prior Function Level of Independence: Independent         Comments: Indep with household/community mobilization; denies fall history.  Participates/assists with household chores and responsbilities as able; enjoys 'walking around the block' when able     Hand Dominance        Extremity/Trunk Assessment   Upper Extremity Assessment Upper Extremity Assessment: Overall WFL for tasks assessed(grossly at least 4/5 throughout)    Lower Extremity Assessment Lower Extremity Assessment: Overall WFL for tasks assessed(grossly at least 4/5 throughout)       Communication   Communication: [place]  Arousal/Alertness: Awake/alert Behavior During Therapy: WFL for tasks assessed/performed Overall Cognitive Status: Within Functional Limits for tasks assessed                                 General  Comments: STM deficits noted, as patient often repeats self and requires re-orientation to hospital stay/plan during session      General Comments      Exercises Other Exercises Other Exercises: Toilet transfer, ambulatory without assist device, sup; able to negotiate tight/narrowed space, move outside immediate BOS without LOB.  Standing balance for urination, peri-care and hand hygiene, close sup; fair/good insight into limits of stability, indep using UEs for external stabilization as needed. Other Exercises: External male catheter removed during session given noted ability to ambulate without assist device; patient, son and staff encouraged to participate with OOB to [facility] or toilet as appropriate for additional mobility opportunities during remaining hospital stay.  All voiced understanding and agreement.   Assessment/Plan    PT Assessment Patient needs continued PT services  PT Problem List Decreased activity tolerance;Decreased balance;Decreased mobility;Decreased cognition       PT Treatment Interventions DME instruction;Gait training;Stair training;Functional mobility training;Therapeutic activities;Therapeutic exercise;Balance training;Cognitive remediation;Patient/family education    PT Goals (Current goals can be found in the Care Plan section)  Acute Rehab PT Goals Patient Stated Goal: to go home PT Goal Formulation: With patient/family Time For Goal Achievement: 11/26/19 Potential to Achieve Goals: Good    Frequency Min 2X/week   Barriers to discharge        Co-evaluation               AM-PAC PT "6 Clicks" Mobility  Outcome Measure Help needed turning from your back to your side while in a flat bed without using bedrails?: None Help needed moving from lying on your back to sitting on the side of a flat bed without using bedrails?: None Help needed moving to and from a bed to a chair (including a wheelchair)?: A Little Help needed standing up from a chair  using your arms (e.g., wheelchair or bedside chair)?: A Little Help needed to walk in hospital room?: A Little Help needed climbing 3-5 steps with a railing? : A Little 6 Click Score: 20    End of Session Equipment Utilized During Treatment: Gait belt Activity Tolerance: Patient tolerated treatment well Patient left: in bed;with call bell/phone within reach;with bed alarm set Nurse Communication: Mobility status PT Visit Diagnosis: Muscle weakness (generalized) (M62.81);Difficulty in walking, not elsewhere classified (R26.2)    Time: [id number] PT Time Calculation (min) (ACUTE ONLY): 30 min   Charges:   PT Evaluation $PT Eval Moderate Complexity: 1 Mod PT Treatments $Therapeutic Activity: 8-22 mins        Christianna Belmonte H. Owens Shark, PT, DPT, NCS 11/12/19, 4:19 PM [phone]

## 2019-11-12 NOTE — Progress Notes (Signed)
Patient discharged with personal belongings via wheelchair to home with Home health . Transported by personal vehicle with son . Understands follow up appointments and where to pick up new medication IV removed from RFA

## 2019-11-13 LAB — CULTURE, BLOOD (ROUTINE X 2)

## 2019-11-15 ENCOUNTER — Other Ambulatory Visit: Payer: Self-pay

## 2019-11-15 ENCOUNTER — Inpatient Hospital Stay: Payer: PPO

## 2019-11-15 ENCOUNTER — Telehealth: Payer: Self-pay

## 2019-11-15 ENCOUNTER — Inpatient Hospital Stay (HOSPITAL_BASED_OUTPATIENT_CLINIC_OR_DEPARTMENT_OTHER): Payer: PPO | Admitting: Oncology

## 2019-11-15 ENCOUNTER — Encounter: Payer: Self-pay | Admitting: Oncology

## 2019-11-15 VITALS — BP 122/57 | HR 65 | Temp 97.3°F | Resp 17 | Wt 167.3 lb

## 2019-11-15 DIAGNOSIS — I2510 Atherosclerotic heart disease of native coronary artery without angina pectoris: Secondary | ICD-10-CM | POA: Diagnosis not present

## 2019-11-15 DIAGNOSIS — C9142 Hairy cell leukemia, in relapse: Secondary | ICD-10-CM

## 2019-11-15 DIAGNOSIS — N189 Chronic kidney disease, unspecified: Secondary | ICD-10-CM | POA: Diagnosis not present

## 2019-11-15 DIAGNOSIS — I129 Hypertensive chronic kidney disease with stage 1 through stage 4 chronic kidney disease, or unspecified chronic kidney disease: Secondary | ICD-10-CM | POA: Diagnosis not present

## 2019-11-15 DIAGNOSIS — E785 Hyperlipidemia, unspecified: Secondary | ICD-10-CM | POA: Diagnosis not present

## 2019-11-15 DIAGNOSIS — D6959 Other secondary thrombocytopenia: Secondary | ICD-10-CM | POA: Diagnosis not present

## 2019-11-15 DIAGNOSIS — I10 Essential (primary) hypertension: Secondary | ICD-10-CM | POA: Diagnosis not present

## 2019-11-15 DIAGNOSIS — C9140 Hairy cell leukemia not having achieved remission: Secondary | ICD-10-CM | POA: Diagnosis not present

## 2019-11-15 DIAGNOSIS — F0390 Unspecified dementia without behavioral disturbance: Secondary | ICD-10-CM | POA: Diagnosis not present

## 2019-11-15 DIAGNOSIS — Z7982 Long term (current) use of aspirin: Secondary | ICD-10-CM | POA: Diagnosis not present

## 2019-11-15 DIAGNOSIS — Z79899 Other long term (current) drug therapy: Secondary | ICD-10-CM | POA: Diagnosis not present

## 2019-11-15 DIAGNOSIS — Z951 Presence of aortocoronary bypass graft: Secondary | ICD-10-CM | POA: Diagnosis not present

## 2019-11-15 DIAGNOSIS — K219 Gastro-esophageal reflux disease without esophagitis: Secondary | ICD-10-CM | POA: Diagnosis not present

## 2019-11-15 DIAGNOSIS — D709 Neutropenia, unspecified: Secondary | ICD-10-CM | POA: Diagnosis not present

## 2019-11-15 LAB — CULTURE, BLOOD (ROUTINE X 2)
Culture: NO GROWTH
Culture: NO GROWTH
Special Requests: ADEQUATE

## 2019-11-15 LAB — CBC WITH DIFFERENTIAL/PLATELET
Abs Immature Granulocytes: 0.02 10*3/uL (ref 0.00–0.07)
Basophils Absolute: 0 10*3/uL (ref 0.0–0.1)
Basophils Relative: 0 %
Eosinophils Absolute: 0 10*3/uL (ref 0.0–0.5)
Eosinophils Relative: 1 %
HCT: 29.2 % — ABNORMAL LOW (ref 39.0–52.0)
Hemoglobin: 9.9 g/dL — ABNORMAL LOW (ref 13.0–17.0)
Immature Granulocytes: 1 %
Lymphocytes Relative: 7 %
Lymphs Abs: 0.1 10*3/uL — ABNORMAL LOW (ref 0.7–4.0)
MCH: 33.6 pg (ref 26.0–34.0)
MCHC: 33.9 g/dL (ref 30.0–36.0)
MCV: 99 fL (ref 80.0–100.0)
Monocytes Absolute: 0 10*3/uL — ABNORMAL LOW (ref 0.1–1.0)
Monocytes Relative: 2 %
Neutro Abs: 1.7 10*3/uL (ref 1.7–7.7)
Neutrophils Relative %: 89 %
Platelets: 102 10*3/uL — ABNORMAL LOW (ref 150–400)
RBC: 2.95 MIL/uL — ABNORMAL LOW (ref 4.22–5.81)
RDW: 15.6 % — ABNORMAL HIGH (ref 11.5–15.5)
WBC: 1.9 10*3/uL — ABNORMAL LOW (ref 4.0–10.5)
nRBC: 0 % (ref 0.0–0.2)

## 2019-11-15 LAB — BASIC METABOLIC PANEL
Anion gap: 7 (ref 5–15)
BUN: 30 mg/dL — ABNORMAL HIGH (ref 8–23)
CO2: 24 mmol/L (ref 22–32)
Calcium: 8.8 mg/dL — ABNORMAL LOW (ref 8.9–10.3)
Chloride: 107 mmol/L (ref 98–111)
Creatinine, Ser: 1.21 mg/dL (ref 0.61–1.24)
GFR calc Af Amer: >60 mL/min (ref >60)
GFR calc non Af Amer: 54 mL/min — ABNORMAL LOW (ref >60)
Glucose, Bld: 109 mg/dL — ABNORMAL HIGH (ref 70–99)
Potassium: 3.4 mmol/L — ABNORMAL LOW (ref 3.5–5.1)
Sodium: 138 mmol/L (ref 135–145)

## 2019-11-15 NOTE — Progress Notes (Signed)
Pt here for follow up. Discharged from hospital on Friday night. No complaints. Pt has irregular heart rate, no symptoms.

## 2019-11-15 NOTE — Progress Notes (Signed)
Frank Mcdaniel  [phone] [phone]  ID: Frank Mcdaniel OB: 08-03-1933  MR#: [id number]  SAY#:[id number]  Patient Care Team: [name], MD as PCP - General (Family Medicine) [name], MD as Consulting Physician (Cardiology) [name], MD as Consulting Physician (Oncology) [name] as Physician Assistant (Urology)  CHIEF COMPLAINT: Recurrent hairy cell leukemia.  INTERVAL HISTORY: Patient returns to clinic today for hospital follow-up and further evaluation.  He feels significantly improved since discharge and nearly back to his baseline.  He continues to have mild confusion.  He does not complain of any weakness or fatigue today.  He has no neurologic complaints.  He denies any fevers.  He denies any chest pain, shortness of breath, cough, or hemoptysis.  He denies nausea, vomiting, diarrhea or constipation.  He has no urinary complaints.  Patient offers no further specific complaints today.  REVIEW OF SYSTEMS:   Review of Systems  Constitutional: Negative.  Negative for fever, malaise/fatigue and weight loss.  Respiratory: Negative.  Negative for cough, hemoptysis and shortness of breath.   Cardiovascular: Negative.  Negative for chest pain and leg swelling.  Gastrointestinal: Negative.  Negative for abdominal pain.  Genitourinary: Negative.  Negative for dysuria.  Musculoskeletal: Negative.   Skin: Negative.  Negative for rash.  Neurological: Negative.  Negative for dizziness, focal weakness, weakness and headaches.  Psychiatric/Behavioral: The patient is not nervous/anxious.     As per HPI. Otherwise, a complete review of systems is negative.  PAST MEDICAL HISTORY: Past Medical History:  Diagnosis Date  . Anemia 09/01/2015  . Anxiety 09/11/2015  . Arthritis   . Asthma    as a teenager  . Asymptomatic Sinus Bradycardia   . Atypical chest pain    a. 08/2014  . BPH (benign prostatic  hyperplasia)   . CKD (chronic kidney disease), stage III   . Closed fracture of tuft of distal phalanx of finger 08/31/2018  . Coronary artery disease    a. 2000 s/p CABG;  b. 10/2008 Neg MV, EF 64%; c. cath 05/2015: LM 85% sev cal, pLAD-1 lesion 80%, pLAD-2 lesion 100% chronic, ostLCx 70% sev cal, OM4 90%, ostRCA 70%, mid RCA 90%, LIMA-LAD patent, VG-OM2 patent, VG-OM3 patent, VG-RPDA patent. No AS  . Dementia without behavioral disturbance ([place]) 05/28/2019  . GERD (gastroesophageal reflux disease)   . H/O echocardiogram    a. 10/2005 Echo: nl EF.  Frank Mcdaniel Hairy cell leukemia ([place])   . HOH (hard of hearing)    Bilateral hearing aids  . HTN (hypertension)   . Hyperlipidemia   . Hypertension   . Leukemia ([place])   . Stricture and stenosis of esophagus     PAST SURGICAL HISTORY: Past Surgical History:  Procedure Laterality Date  . CARDIAC CATHETERIZATION N/A 05/26/2015   Procedure: Left Heart Cath;  Surgeon: [name], MD;  Location: [place] CV LAB;  Service: Cardiovascular;  Laterality: N/A;  . CARDIAC CATHETERIZATION N/A 05/26/2015   Procedure: Coronary/Graft Angiography;  Surgeon: [name], MD;  Location: [place] CV LAB;  Service: Cardiovascular;  Laterality: N/A;  . CATARACT EXTRACTION    . CORONARY ARTERY BYPASS GRAFT  2000  . ESOPHAGOGASTRODUODENOSCOPY N/A 02/18/2017   Procedure: ESOPHAGOGASTRODUODENOSCOPY (EGD) with removal of food bolus;  Surgeon: [name], MD;  Location: [facility] ENDOSCOPY;  Service: Endoscopy;  Laterality: N/A;  . ESOPHAGOGASTRODUODENOSCOPY (EGD) WITH PROPOFOL N/A 03/11/2017   Procedure: ESOPHAGOGASTRODUODENOSCOPY (EGD) WITH PROPOFOL;  Surgeon: [name], MD;  Location:  [place] ENDOSCOPY;  Service: Endoscopy;  Laterality: N/A;  . ESOPHAGOGASTRODUODENOSCOPY (EGD) WITH PROPOFOL N/A 04/15/2017   Procedure: ESOPHAGOGASTRODUODENOSCOPY (EGD) WITH PROPOFOL;  Surgeon: [name], MD;  Location: [facility] ENDOSCOPY;  Service: Endoscopy;  Laterality: N/A;  .  EYE SURGERY Left    Cataract Extraction with IOL  . INGUINAL HERNIA REPAIR Left 06/24/2016   Procedure: HERNIA REPAIR INGUINAL ADULT;  Surgeon: [name], MD;  Location: [facility] ORS;  Service: General;  Laterality: Left;  . TONSILLECTOMY  1942   . UPPER GASTROINTESTINAL ENDOSCOPY  3 years ago    with Dilation.     FAMILY HISTORY Family History  Problem Relation Age of Onset  . Hypertension Mother   . Arthritis Father   . Other Other        no premature CAD.  Frank Mcdaniel Colon cancer Neg Hx   . Prostate cancer Neg Hx   . Stomach cancer Neg Hx        ADVANCED DIRECTIVES:    HEALTH MAINTENANCE: Social History   Tobacco Use  . Smoking status: Never Smoker  . Smokeless tobacco: Never Used  Substance Use Topics  . Alcohol use: No  . Drug use: No     No Known Allergies  Current Outpatient Medications  Medication Sig Dispense Refill  . atorvastatin (LIPITOR) 40 MG tablet TAKE 1/2 TABLETS (20 MG TOTAL) BY MOUTH DAILY. 45 tablet 3  . Calcium Carbonate Antacid (TUMS CHEWY BITES PO) Take 1 tablet by mouth as needed.     Frank Mcdaniel CLINPRO 5000 1.1 % PSTE APPLY PEASIZED AMOUNT, BRUSH DAILY AT LEAST 2 MINUTES. SPIT AFTER USE    . dexamethasone (DECADRON) 4 MG tablet Take 1 tablet (4 mg total) by mouth every 12 (twelve) hours for 7 days. 14 tablet 0  . donepezil (ARICEPT) 10 MG tablet Take 10 mg by mouth at bedtime.    . isosorbide mononitrate (IMDUR) 30 MG 24 hr tablet TAKE 1 TABLET BY MOUTH EVERY DAY 90 tablet 3  . Multiple Vitamin (MULTIVITAMIN) tablet Take 1 tablet by mouth daily.      . ondansetron (ZOFRAN) 8 MG tablet Take 1 tablet (8 mg total) by mouth 2 (two) times daily as needed (Nausea or vomiting). 30 tablet 1  . polyethylene glycol (MIRALAX / GLYCOLAX) packet Take 17 g by mouth daily as needed.     . prochlorperazine (COMPAZINE) 10 MG tablet Take 1 tablet (10 mg total) by mouth every 6 (six) hours as needed (Nausea or vomiting). 60 tablet 2  . tamsulosin (FLOMAX) 0.4 MG CAPS  capsule TAKE 1 CAPSULES (0.4 MG TOTAL) BY MOUTH DAILY. 30 capsule 3   No current facility-administered medications for this visit.    OBJECTIVE: Vitals:   11/15/19 0903  BP: (!) 122/57  Pulse: 65  Resp: 17  Temp: (!) 97.3 F (36.3 C)  SpO2: 100%     Body mass index is 24.71 kg/m.    ECOG FS:0 - Asymptomatic  General: Thin, no acute distress. Eyes: Pink conjunctiva, anicteric sclera. HEENT: Normocephalic, moist mucous membranes. Lungs: No audible wheezing or coughing. Heart: EKG completed with a regular rate.   Abdomen: Soft, nontender, no obvious distention. Musculoskeletal: No edema, cyanosis, or clubbing. Neuro: Alert, answering all questions appropriately. Cranial nerves grossly intact. Skin: No rashes or petechiae noted. Psych: Normal affect.   LAB RESULTS:  Lab Results  Component Value Date   NA 138 11/15/2019   K 3.4 (L) 11/15/2019   CL 107 11/15/2019   CO2 24 11/15/2019  GLUCOSE 109 (H) 11/15/2019   BUN 30 (H) 11/15/2019   CREATININE 1.21 11/15/2019   CALCIUM 8.8 (L) 11/15/2019   PROT 5.7 (L) 11/12/2019   ALBUMIN 3.3 (L) 11/12/2019   AST 30 11/12/2019   ALT 17 11/12/2019   ALKPHOS 50 11/12/2019   BILITOT 0.8 11/12/2019   GFRNONAA 54 (L) 11/15/2019   GFRAA >60 11/15/2019    Lab Results  Component Value Date   WBC 1.9 (L) 11/15/2019   NEUTROABS 1.7 11/15/2019   HGB 9.9 (L) 11/15/2019   HCT 29.2 (L) 11/15/2019   MCV 99.0 11/15/2019   PLT 102 (L) 11/15/2019     STUDIES: CT Angio Chest PE W and/or Wo Contrast  Result Date: 11/08/2019 CLINICAL DATA:  Hypoxia, fever EXAM: CT ANGIOGRAPHY CHEST WITH CONTRAST TECHNIQUE: Multidetector CT imaging of the chest was performed using the standard protocol during bolus administration of intravenous contrast. Multiplanar CT image reconstructions and MIPs were obtained to evaluate the vascular anatomy. CONTRAST:  57m OMNIPAQUE IOHEXOL 350 MG/ML SOLN COMPARISON:  01/13/2015 FINDINGS: Cardiovascular: No filling  defects in the pulmonary arteries to suggest pulmonary emboli. Heart is normal size. Prior CABG. Aortic atherosclerosis. No aneurysm. Mediastinum/Nodes: No mediastinal, hilar, or axillary adenopathy. Small hiatal hernia. Esophagus mildly distended with gas and fluid may be related to reflux. Distal esophageal wall appears mildly thickened, possibly related to reflux esophagitis. Lungs/Pleura: Lungs are clear. No focal airspace opacities or suspicious nodules. No effusions. Upper Abdomen: Gallstone noted with fluid in the gallbladder, stable. No acute findings. Musculoskeletal: Chest wall soft tissues are unremarkable. No acute bony abnormality. Review of the MIP images confirms the above findings. IMPRESSION: No evidence of pulmonary embolus. Prior CABG. Small hiatal hernia. Distal esophageal wall thickening and mild distention of the esophagus with air and fluid. Findings may be related to reflux. Aortic Atherosclerosis (ICD10-I70.0). Electronically Signed   By: KRolm BaptiseM.D.   On: 11/08/2019 17:46   DG Chest Port 1 View  Result Date: 11/08/2019 CLINICAL DATA:  Shortness of breath EXAM: PORTABLE CHEST 1 VIEW COMPARISON:  04/10/2016 FINDINGS: Prior CABG. Heart is normal size. Low lung volumes. No confluent opacities or effusions. No acute bony abnormality. IMPRESSION: Low lung volumes.  No acute cardiopulmonary disease. Electronically Signed   By: KRolm BaptiseM.D.   On: 11/08/2019 17:24   ECHOCARDIOGRAM COMPLETE  Result Date: 11/12/2019    ECHOCARDIOGRAM REPORT   Patient Name:   Frank MORELOSDate of Exam: 11/12/2019 Medical Rec #:  [id number]        Height:       69.0 in Accession #:    [id number]       Weight:       168.4 lb Date of Birth:  1May 28, 1934        BSA:          1.921 m Patient Age:    89years          BP:           122/79 mmHg Patient Gender: M                 HR:           77 bpm. Exam Location:  [facility] Procedure: 2D Echo, Color Doppler, Cardiac Doppler and Intracardiac             Opacification Agent Indications:     R78.81 Bacteremia  History:         Patient has prior history  of Echocardiogram examinations. Prior                  CABG, CKD; Risk Factors:Hypertension and Dyslipidemia.  Sonographer:     Charmayne Sheer RDCS (AE) Referring Phys:  [id number] Wyvonnia Dusky Diagnosing Phys: Ida Rogue MD  Sonographer Comments: Suboptimal parasternal window and suboptimal subcostal window. Image acquisition challenging due to respiratory motion. IMPRESSIONS  1. Left ventricular ejection fraction, by estimation, is 60 to 65%. The left ventricle has normal function. The left ventricle has no regional wall motion abnormalities. Left ventricular diastolic parameters are consistent with Grade I diastolic dysfunction (impaired relaxation).  2. Right ventricular systolic function is normal. The right ventricular size is normal. Tricuspid regurgitation signal is inadequate for assessing PA pressure.  3. Frequent ectopy noted. FINDINGS  Left Ventricle: Left ventricular ejection fraction, by estimation, is 60 to 65%. The left ventricle has normal function. The left ventricle has no regional wall motion abnormalities. Definity contrast agent was given IV to delineate the left ventricular  endocardial borders. The left ventricular internal cavity size was normal in size. There is no left ventricular hypertrophy. Left ventricular diastolic parameters are consistent with Grade I diastolic dysfunction (impaired relaxation). Right Ventricle: The right ventricular size is normal. No increase in right ventricular wall thickness. Right ventricular systolic function is normal. Tricuspid regurgitation signal is inadequate for assessing PA pressure. Left Atrium: Left atrial size was normal in size. Right Atrium: Right atrial size was normal in size. Pericardium: There is no evidence of pericardial effusion. Mitral Valve: The mitral valve is normal in structure. Normal mobility of the mitral valve leaflets. Mild mitral  valve regurgitation. No evidence of mitral valve stenosis. MV peak gradient, 5.2 mmHg. The mean mitral valve gradient is 1.0 mmHg. Tricuspid Valve: The tricuspid valve is normal in structure. Tricuspid valve regurgitation is mild . No evidence of tricuspid stenosis. Aortic Valve: The aortic valve was not well visualized. Aortic valve regurgitation is not visualized. No aortic stenosis is present. Aortic valve mean gradient measures 6.0 mmHg. Aortic valve peak gradient measures 9.9 mmHg. Aortic valve area, by VTI measures 2.04 cm. Pulmonic Valve: The pulmonic valve was normal in structure. Pulmonic valve regurgitation is not visualized. No evidence of pulmonic stenosis. Aorta: The aortic root is normal in size and structure. Venous: The inferior vena cava is normal in size with greater than 50% respiratory variability, suggesting right atrial pressure of 3 mmHg. IAS/Shunts: No atrial level shunt detected by color flow Doppler.  LEFT VENTRICLE PLAX 2D LVIDd:         3.77 cm  Diastology LVIDs:         2.39 cm  LV e' lateral:   11.00 cm/s LV PW:         0.98 cm  LV E/e' lateral: 6.7 LV IVS:        0.76 cm  LV e' medial:    6.85 cm/s LVOT diam:     2.00 cm  LV E/e' medial:  10.7 LV SV:         62 LV SV Index:   32 LVOT Area:     3.14 cm  RIGHT VENTRICLE RV Basal diam:  2.76 cm LEFT ATRIUM             Index       RIGHT ATRIUM          Index LA diam:        3.40 cm 1.77 cm/m  RA Area:  9.05 cm LA Vol (A2C):   47.7 ml 24.84 ml/m RA Volume:   12.60 ml 6.56 ml/m LA Vol (A4C):   46.7 ml 24.32 ml/m LA Biplane Vol: 48.0 ml 24.99 ml/m  AORTIC VALVE                    PULMONIC VALVE AV Area (Vmax):    2.16 cm     PV Vmax:       1.25 m/s AV Area (Vmean):   1.94 cm     PV Vmean:      85.700 cm/s AV Area (VTI):     2.04 cm     PV VTI:        0.256 m AV Vmax:           157.00 cm/s  PV Peak grad:  6.2 mmHg AV Vmean:          109.000 cm/s PV Mean grad:  3.0 mmHg AV VTI:            0.305 m AV Peak Grad:      9.9 mmHg AV  Mean Grad:      6.0 mmHg LVOT Vmax:         108.00 cm/s LVOT Vmean:        67.400 cm/s LVOT VTI:          0.198 m LVOT/AV VTI ratio: 0.65  AORTA Ao Root diam: 3.20 cm MITRAL VALVE MV Area (PHT): 2.50 cm    SHUNTS MV Peak grad:  5.2 mmHg    Systemic VTI:  0.20 m MV Mean grad:  1.0 mmHg    Systemic Diam: 2.00 cm MV Vmax:       1.14 m/s MV Vmean:      52.5 cm/s MV Decel Time: 303 msec MV E velocity: 73.20 cm/s MV A velocity: 97.50 cm/s MV E/A ratio:  0.75 Ida Rogue MD Electronically signed by Ida Rogue MD Signature Date/Time: 11/12/2019/5:07:04 PM    Final    ONCOLOGY HISTORY:  Patient initially received cladribine in 2006 and then was treated for a relapse in 2011 and again in 2014. Bone marrow biopsy at that time confirmed recurrence of disease with 80-90% hairy cell leukemia noted in his specimen. Patient completed cladribine 0.15 mg/kg on days 1 through 5 along with 8 weekly cycles of Rituxan on April 18, 2015. Bone marrow biopsy results after completion of 8 weeks of Rituxan without evidence of residual disease.  Patient typically has no evidence of disease and peripheral blood.   ASSESSMENT: Recurrent hairy cell leukemia.  PLAN:   1.  Recurrent hairy cell leukemia: See oncology history as above.  Bone marrow biopsy on November 02, 2019 revealed recurrent hairy cell leukemia, but percentage of involvement was not reported.  Initial plan was to restart cladribine and Rituxan last week.  Patient received day 1 of treatment, but then was admitted to the hospital.  It does not appear his symptoms or reason for admission was related to treatment.  Patient would like to reinitiate treatment, but will hold off until patient has cardiac evaluation given his abnormal EKG today.  Return to clinic in 3 weeks for further evaluation laboratory work at which point we will also discuss when to reinitiate treatment.    2. Thrombocytopenia: Secondary to recurrent disease.  Platelet count has improved to greater  than 100. 3. Weakness and fatigue: Patient does not complain of this today.  Patient has been instructed to decrease his Decadron dose  to 4 mg daily and complete his current prescription. 4. GERD: Chronic and unchanged.  Continue over-the-counter Zantac and Tums as needed. 5.  Neutropenia: Secondary to recurrent disease.  ANC is 1.7 today. 6.  Cardiac disease: EKG today revealed an irregular rhythm and have referred patient back to his primary cardiologist for further evaluation.  He has been instructed to reinitiate 81 mg aspirin per day.  I spent a total of 30 minutes reviewing chart data, face-to-face evaluation with the patient, counseling and coordination of care as detailed above.   Patient expressed understanding and was in agreement with this plan. He also understands that He can call clinic at any time with any questions, concerns, or complaints.    [name], MD 11/15/19 10:10 AM

## 2019-11-15 NOTE — Telephone Encounter (Signed)
Transition Care Management Follow-up Telephone Call  Date of discharge and from where: 11/12/19 from [facility]  How have you been since you were released from the hospital? Information received from son, HIPAA compliant. Patient at baseline. Denies all previous symptoms. Reports the patient becomes confused and worried  when schedule or things become different, changed or out of order.   Any questions or concerns? Discuss if there is any medication he can take when schedule changes?    Items Reviewed:  Did the pt receive and understand the discharge instructions provided? Yes, increase activity as tolerated.   Medications obtained and verified? Yes, stop aspirin. Restarted today.   Any new allergies since your discharge? None.  Dietary orders reviewed? Yes, general.  Do you have support at home? Yes, son [name] assists as needed.   Functional Questionnaire: (I = Independent and D = Dependent) ADLs: I  Bathing/Dressing- I  Meal Prep- I  Eating- I  Maintaining continence- I  Transferring/Ambulation- I  Managing Meds- I  Follow up appointments reviewed:   PCP Hospital f/u appt confirmed?  Scheduled to see Dr. Caryl Bis on 11/22/19 @ 1130.   [facility] f/u appt confirmed?  Oncologist seen today as follow up. Notes restart of aspirin 81 mg take 1 tablet daily. Follow up again in 3 weeks to discuss infusion. Infusion not received today. Cardiology scheduled.   Are transportation arrangements needed? No   If their condition worsens, is the pt aware to call PCP or go to the Emergency Dept.? Yes  Was the patient provided with contact information for the PCP's office or ED? Yes  Was to pt encouraged to call back with questions or concerns? Yes

## 2019-11-16 NOTE — Telephone Encounter (Signed)
Reviewed

## 2019-11-17 ENCOUNTER — Ambulatory Visit (INDEPENDENT_AMBULATORY_CARE_PROVIDER_SITE_OTHER): Payer: PPO | Admitting: Physician Assistant

## 2019-11-17 ENCOUNTER — Other Ambulatory Visit: Payer: Self-pay

## 2019-11-17 ENCOUNTER — Other Ambulatory Visit
Admission: RE | Admit: 2019-11-17 | Discharge: 2019-11-17 | Disposition: A | Discharge status: Home / Self Care | Payer: PPO | Source: Ambulatory Visit | Attending: Physician Assistant | Admitting: Physician Assistant

## 2019-11-17 ENCOUNTER — Encounter: Payer: Self-pay | Admitting: Physician Assistant

## 2019-11-17 VITALS — BP 100/60 | HR 65 | Ht 68.0 in | Wt 164.2 lb

## 2019-11-17 DIAGNOSIS — Z856 Personal history of leukemia: Secondary | ICD-10-CM

## 2019-11-17 DIAGNOSIS — I10 Essential (primary) hypertension: Secondary | ICD-10-CM

## 2019-11-17 DIAGNOSIS — N189 Chronic kidney disease, unspecified: Secondary | ICD-10-CM | POA: Diagnosis not present

## 2019-11-17 DIAGNOSIS — I472 Ventricular tachycardia: Secondary | ICD-10-CM

## 2019-11-17 DIAGNOSIS — I25118 Atherosclerotic heart disease of native coronary artery with other forms of angina pectoris: Secondary | ICD-10-CM

## 2019-11-17 DIAGNOSIS — D649 Anemia, unspecified: Secondary | ICD-10-CM | POA: Diagnosis not present

## 2019-11-17 DIAGNOSIS — I493 Ventricular premature depolarization: Secondary | ICD-10-CM

## 2019-11-17 DIAGNOSIS — J449 Chronic obstructive pulmonary disease, unspecified: Secondary | ICD-10-CM

## 2019-11-17 DIAGNOSIS — Z951 Presence of aortocoronary bypass graft: Secondary | ICD-10-CM

## 2019-11-17 DIAGNOSIS — I4729 Other ventricular tachycardia: Secondary | ICD-10-CM

## 2019-11-17 DIAGNOSIS — E785 Hyperlipidemia, unspecified: Secondary | ICD-10-CM

## 2019-11-17 DIAGNOSIS — E876 Hypokalemia: Secondary | ICD-10-CM

## 2019-11-17 LAB — BASIC METABOLIC PANEL
Anion gap: 9 (ref 5–15)
BUN: 27 mg/dL — ABNORMAL HIGH (ref 8–23)
CO2: 23 mmol/L (ref 22–32)
Calcium: 8.7 mg/dL — ABNORMAL LOW (ref 8.9–10.3)
Chloride: 105 mmol/L (ref 98–111)
Creatinine, Ser: 1.25 mg/dL — ABNORMAL HIGH (ref 0.61–1.24)
GFR calc Af Amer: >60 mL/min (ref >60)
GFR calc non Af Amer: 52 mL/min — ABNORMAL LOW (ref >60)
Glucose, Bld: 139 mg/dL — ABNORMAL HIGH (ref 70–99)
Potassium: 4 mmol/L (ref 3.5–5.1)
Sodium: 137 mmol/L (ref 135–145)

## 2019-11-17 LAB — MAGNESIUM: Magnesium: 2.3 mg/dL (ref 1.7–2.4)

## 2019-11-17 NOTE — Progress Notes (Signed)
Office Visit    Patient Name: Frank Mcdaniel Date of Encounter: 11/18/2019  Primary Care Provider:  [name], MD Primary Cardiologist:  Ida Rogue, MD  Chief Complaint    Chief Complaint  Patient presents with  . OTHER    ABN Ekg no complaints today. Meds reviewed verbally with pt.    86 year old male with history of CAD s/p four-vessel CABG 02/1999 (LIMA to LAD, VG to OM2, VG to OM3), hairy cell leukemia, chronic anemia, hypertension, hyperlipidemia, COPD, CKD stage III, and chronic chest pain, and who presents today for recently noted abnormal EKG per oncology with referral to cardiology.  Past Medical History    Past Medical History:  Diagnosis Date  . Anemia 09/01/2015  . Anxiety 09/11/2015  . Arthritis   . Asthma    as a teenager  . Asymptomatic Sinus Bradycardia   . Atypical chest pain    a. 08/2014  . BPH (benign prostatic hyperplasia)   . CKD (chronic kidney disease), stage III   . Closed fracture of tuft of distal phalanx of finger 08/31/2018  . Coronary artery disease    a. 2000 s/p CABG;  b. 10/2008 Neg MV, EF 64%; c. cath 05/2015: LM 85% sev cal, pLAD-1 lesion 80%, pLAD-2 lesion 100% chronic, ostLCx 70% sev cal, OM4 90%, ostRCA 70%, mid RCA 90%, LIMA-LAD patent, VG-OM2 patent, VG-OM3 patent, VG-RPDA patent. No AS  . Dementia without behavioral disturbance ([place]) 05/28/2019  . GERD (gastroesophageal reflux disease)   . H/O echocardiogram    a. 10/2005 Echo: nl EF.  [name] Hairy cell leukemia ([place])   . HOH (hard of hearing)    Bilateral hearing aids  . HTN (hypertension)   . Hyperlipidemia   . Hypertension   . Leukemia ([place])   . Stricture and stenosis of esophagus    Past Surgical History:  Procedure Laterality Date  . CARDIAC CATHETERIZATION N/A 05/26/2015   Procedure: Left Heart Cath;  Surgeon: [name], MD;  Location: [place] [facility];  Service: Cardiovascular;  Laterality: N/A;  . CARDIAC CATHETERIZATION N/A 05/26/2015   Procedure:  Coronary/Graft Angiography;  Surgeon: [name], MD;  Location: [place] [facility];  Service: Cardiovascular;  Laterality: N/A;  . CATARACT EXTRACTION    . CORONARY ARTERY BYPASS GRAFT  2000  . ESOPHAGOGASTRODUODENOSCOPY N/A 02/18/2017   Procedure: ESOPHAGOGASTRODUODENOSCOPY (EGD) with removal of food bolus;  Surgeon: [name], MD;  Location: [facility] ENDOSCOPY;  Service: Endoscopy;  Laterality: N/A;  . ESOPHAGOGASTRODUODENOSCOPY (EGD) WITH PROPOFOL N/A 03/11/2017   Procedure: ESOPHAGOGASTRODUODENOSCOPY (EGD) WITH PROPOFOL;  Surgeon: [name], MD;  Location: [facility] ENDOSCOPY;  Service: Endoscopy;  Laterality: N/A;  . ESOPHAGOGASTRODUODENOSCOPY (EGD) WITH PROPOFOL N/A 04/15/2017   Procedure: ESOPHAGOGASTRODUODENOSCOPY (EGD) WITH PROPOFOL;  Surgeon: [name], MD;  Location: [facility] ENDOSCOPY;  Service: Endoscopy;  Laterality: N/A;  . EYE SURGERY Left    Cataract Extraction with IOL  . INGUINAL HERNIA REPAIR Left 06/24/2016   Procedure: HERNIA REPAIR INGUINAL ADULT;  Surgeon: [name], MD;  Location: [facility] ORS;  Service: General;  Laterality: Left;  . TONSILLECTOMY  1942   . UPPER GASTROINTESTINAL ENDOSCOPY  3 years ago    with Dilation.     Allergies  No Known Allergies  History of Present Illness    Frank Mcdaniel is a 86 y.o. male with PMH as above.  He previously worked with trains.  He has a history of CAD s/p bypass in July 2000, hairy cell leukemia,  asthma, chronic anemia, HLD, and history of mild lower extremity edema with right side worse than that of left.  He has a history of bypass in 2000, LIMA to the LAD, vein graft to the PDA, vein graft to the OM, vein graft to the diagonal.  In 2016, he underwent LHC 2/2 report of shortness of breath, angina, and fatigue. No LV gram was performed, given CKD.  Findings showed patent grafts x4 and likely jump graft to OM 2, OM 3.  Severe three-vessel disease was noted with recommendation for medical management and no  intervention planned at that time.  It was thought that the etiology of his most recent symptoms was possibly secondary to leukemia and given his general fatigue.  He underwent 2017 echo that showed EF 55 to 60%.  He was previously seen by his primary cardiologist 10/04/2019.  Rest test at that time showed no significant ischemia.  At that time, he denied chest pain or shortness of breath.  He was reportedly busy around the house.  He was walking around the block but without any regular exercise program.  It was noted that he was not driving recently and was not going out as often.  Given his history of anemia, he continue to follow-up with Dr. [name].  Given recent blood work, plan was for biopsy on review of EMR.  Most recent 2021 echo as below with EF 60 to 65% and G1 DD.  Frequent ectopy was noted time of this echocardiogram.   He presents today in clinic per the request of his oncologist after abnormal EKG was obtained at his most recent visit.  On review of most recently obtained EKG, tracings are consistent with sinus rhythm with right bundle branch block and repolarization abnormality 2/2 LVH.  Frequent ectopy noted with ventricular couplets and bigeminy. Patient denies any symptoms associated with this ectopy. No racing heart rate or palpitations.  No chest pain, DOE, shortness of breath.  He does note symptoms consistent with dehydration or orthostasis with recommendation for increased hydration.  He denies any signs or symptoms consistent with acute heart failure, including abdominal distention, PND, or orthopnea.  No signs or symptoms consistent with bleeding.  He reports medication compliance.  Home Medications    Prior to Admission medications   Medication Sig Start Date End Date Taking? Authorizing Provider  atorvastatin (LIPITOR) 40 MG tablet TAKE 1/2 TABLETS (20 MG TOTAL) BY MOUTH DAILY. 10/26/19   [name], MD  Calcium Carbonate Antacid (TUMS CHEWY BITES PO) Take 1 tablet by mouth  as needed.     [provider]  CLINPRO 5000 1.1 % PSTE APPLY PEASIZED AMOUNT, BRUSH DAILY AT LEAST 2 MINUTES. SPIT AFTER USE 04/11/19   [provider]  dexamethasone (DECADRON) 4 MG tablet Take 1 tablet (4 mg total) by mouth every 12 (twelve) hours for 7 days. 11/12/19 11/19/19  Wyvonnia Dusky, MD  donepezil (ARICEPT) 10 MG tablet Take 10 mg by mouth at bedtime.    [provider]  isosorbide mononitrate (IMDUR) 30 MG 24 hr tablet TAKE 1 TABLET BY MOUTH EVERY DAY 08/31/19   [name], MD  Multiple Vitamin (MULTIVITAMIN) tablet Take 1 tablet by mouth daily.      [provider]  ondansetron (ZOFRAN) 8 MG tablet Take 1 tablet (8 mg total) by mouth 2 (two) times daily as needed (Nausea or vomiting). 10/18/19   [name], MD  polyethylene glycol (MIRALAX / Floria Raveling) packet Take 17 g by  mouth daily as needed.     [provider]  prochlorperazine (COMPAZINE) 10 MG tablet Take 1 tablet (10 mg total) by mouth every 6 (six) hours as needed (Nausea or vomiting). 10/18/19   [name], MD  tamsulosin (FLOMAX) 0.4 MG CAPS capsule TAKE 1 CAPSULES (0.4 MG TOTAL) BY MOUTH DAILY. 09/01/19   [name], MD    Review of Systems    He denies chest pain, palpitations, dyspnea, pnd, orthopnea, n, v, syncope, edema, weight gain.  He reports reduced oral intake, including fluids, and symptoms consistent with dehydration/orthostasis.   All other systems reviewed and are otherwise negative except as noted above.  Physical Exam    VS:  BP 100/60 (BP Location: Left Arm, Patient Position: Sitting, Cuff Size: Normal)   Pulse 65   Ht 5\' 8"  (1.727 m)   Wt 164 lb 4 oz (74.5 kg)   SpO2 96%   BMI 24.97 kg/m  , BMI Body mass index is 24.97 kg/m. GEN: Well nourished, well developed, in no acute distress. HEENT: normal. Neck: Supple, no JVD, carotid bruits, or masses. Cardiac: RRR with extrasystole appreciated, no murmurs, rubs, or gallops. No  clubbing, cyanosis, or significant edema.  Radials/DP/PT 2+ and equal bilaterally.  Respiratory:  Respirations regular and unlabored, clear to auscultation bilaterally. GI: Soft, nontender, nondistended, BS + x 4. MS: no deformity or atrophy. Skin: warm and dry, no rash. Neuro:  Strength and sensation are intact. Psych: Normal affect.  Accessory Clinical Findings    ECG personally reviewed by me today - SR with ectopy, 65bpm, RBBB, LVH, rate increase from 53  65bpm- no acute changes.  VITALS Reviewed today   Temp Readings from Last 3 Encounters:  11/15/19 (!) 97.3 F (36.3 C) (Tympanic)  11/12/19 98.1 F (36.7 C) (Oral)  11/08/19 97.6 F (36.4 C) (Tympanic)   BP Readings from Last 3 Encounters:  11/17/19 100/60  11/15/19 (!) 122/57  11/12/19 132/75   Pulse Readings from Last 3 Encounters:  11/17/19 65  11/15/19 65  11/12/19 69    Wt Readings from Last 3 Encounters:  11/17/19 164 lb 4 oz (74.5 kg)  11/15/19 167 lb 4.8 oz (75.9 kg)  11/08/19 168 lb 6.9 oz (76.4 kg)     LABS  reviewed today   [place] present and most recent? Yes/No: No  Lab Results  Component Value Date   WBC 1.9 (L) 11/15/2019   HGB 9.9 (L) 11/15/2019   HCT 29.2 (L) 11/15/2019   MCV 99.0 11/15/2019   PLT 102 (L) 11/15/2019   Lab Results  Component Value Date   CREATININE 1.25 (H) 11/17/2019   BUN 27 (H) 11/17/2019   NA 137 11/17/2019   K 4.0 11/17/2019   CL 105 11/17/2019   CO2 23 11/17/2019   Lab Results  Component Value Date   ALT 17 11/12/2019   AST 30 11/12/2019   ALKPHOS 50 11/12/2019   BILITOT 0.8 11/12/2019   Lab Results  Component Value Date   CHOL 131 12/10/2018   HDL 53.90 12/10/2018   LDLCALC 65 12/10/2018   TRIG 59.0 12/10/2018   CHOLHDL 2 12/10/2018    No results found for: HGBA1C Lab Results  Component Value Date   TSH 0.65 05/28/2019     STUDIES/PROCEDURES reviewed today   Echocardiogram 11/12/2019 1. Left ventricular ejection fraction, by  estimation, is 60 to 65%. The  left ventricle has normal function. The left ventricle has no regional  wall motion abnormalities. Left  ventricular diastolic parameters are  consistent with Grade I diastolic  dysfunction (impaired relaxation).  2. Right ventricular systolic function is normal. The right ventricular  size is normal. Tricuspid regurgitation signal is inadequate for assessing  PA pressure.  3. Frequent ectopy noted.   LHC 05/2015  Mid RCA lesion, 90% stenosed.  SVG was injected is normal in caliber, and is anatomically normal.  SVG was injected is normal in caliber, and is anatomically normal.  SVG was injected is normal in caliber, and is anatomically normal.  LM lesion, 85% stenosed.  LIMA was injected is normal in caliber, and is anatomically normal.  Ost 4th Mrg lesion, 90% stenosed.  Prox LAD-2 lesion, 100% stenosed.  Prox LAD-1 lesion, 80% stenosed.  Ost Cx lesion, 70% stenosed.  Ost RCA lesion, 70% stenosed.  Assessment & Plan    Concern for abnormal EKG / Referral for EKG review -Recent tracings reviewed and without acute changes from that of previous EKGs.  No acute ST or T changes.  Increased ectopy noted with ventricular couplets and bigeminy.  No significant arrhythmia noted.  No indication for EP referral.  Other than sx of orthostasis, attributed to recent poor oral intake, no associated sx with ectopy. Labs obtained at the time of his EKGs significant for hypokalemia. Consider ectopy 2/2 hypokalemia and in the setting of ongoing oncology treatments / poor oral intake leading to electrolyte abnormalities and subsequent ectopy. --Recommend increase hydration (<2L). Repeat BMET / magnesium.  If electrolytes at goal, but subsequent EKGs /cardiac exams with ongoing ectopy despite nl electrolytes, consider 2 week ZIO XT to assess burden of ectopy and r/o arrhythmia not captured on recent EKGs. Continue current medications. Low heart rate and soft BP  precludes addition of beta-blocker or calcium channel blocker.   CAD s/p CABG -No symptoms concerning for chest pain or recent angina.  Continue current medical management.  No indication for further ischemic evaluation at this time.  Hypertension -Soft BP.  Continue to monitor.  If ongoing symptoms of orthostasis or presyncope, consider holding Imdur until more room in pressure.  Hyperlipidemia -Continue statin.  LDL goal below 70.  CKD stage III -Continue to monitor.  Hairy cell leukemia -Per oncology.  Medication changes: None Labs ordered: BMET, magnesium Studies / Imaging ordered: None Future considerations: 2-week ZIO XT Disposition: RTC as previously scheduled   Arvil Chaco, PA-C 11/18/2019

## 2019-11-17 NOTE — Patient Instructions (Signed)
Medication Instructions:  Your physician recommends that you continue on your current medications as directed. Please refer to the Current Medication list given to you today.   *If you need a refill on your cardiac medications before your next appointment, please call your pharmacy*   Lab Work: Your physician recommends that you return for lab work [id number] at the medical mall. (BMET, Mag) No appt is needed. Hours are M-F 7AM- 6 PM.  If you have labs (blood work) drawn today and your tests are completely normal, you will receive your results only by: [name] MyChart Message (if you have MyChart) OR . A paper copy in the mail If you have any lab test that is abnormal or we need to change your treatment, we will call you to review the results.   Testing/Procedures: None ordered    Follow-Up: At [facility], you and your health needs are our priority.  As part of our continuing mission to provide you with exceptional heart care, we have created designated Provider Care Teams.  These Care Teams include your primary Cardiologist (physician) and Advanced Practice Providers (APPs -  Physician Assistants and Nurse Practitioners) who all work together to provide you with the care you need, when you need it.  We recommend signing up for the patient portal called "MyChart".  Sign up information is provided on this After Visit Summary.  MyChart is used to connect with patients for Virtual Visits (Telemedicine).  Patients are able to view lab/test results, encounter notes, upcoming appointments, etc.  Non-urgent messages can be sent to your provider as well.   To learn more about what you can do with MyChart, go to [email or web address].    Your next appointment:  As scheduled  Other Instructions 1- Increase daily hydration.

## 2019-11-18 ENCOUNTER — Telehealth: Payer: Self-pay | Admitting: *Deleted

## 2019-11-18 NOTE — Telephone Encounter (Signed)
Arvil Chaco, PA-C  11/18/2019 1:26 PM EDT    Please let Mr. Lary know that his labs showed stable renal function. Please encourage continued hydration as discussed during our visit.   His electrolytes are at goal. His potassium is at goal 4.0. His magnesium is 2.3 with goal magnesium 2.0.   Given that his electrolytes are normal and thus not the reason for his most recent faster heart rates and ectopy, please mail him a 2 week ZIO-XT monitor if agreeable. This will provide [place] with much more information than the static EKGs. It also provides much cleaner and more complete data than the hospital telemetry. If agreeable to this monitoring, please encourage him to use the trigger button when dizzy and also to be sure to log his associated sx as well.   Please let me know if any concerns or questions.

## 2019-11-19 ENCOUNTER — Telehealth: Payer: Self-pay | Admitting: Family Medicine

## 2019-11-19 DIAGNOSIS — F0280 Dementia in other diseases classified elsewhere without behavioral disturbance: Secondary | ICD-10-CM | POA: Diagnosis not present

## 2019-11-19 DIAGNOSIS — E785 Hyperlipidemia, unspecified: Secondary | ICD-10-CM | POA: Diagnosis not present

## 2019-11-19 DIAGNOSIS — N400 Enlarged prostate without lower urinary tract symptoms: Secondary | ICD-10-CM | POA: Diagnosis not present

## 2019-11-19 DIAGNOSIS — J45909 Unspecified asthma, uncomplicated: Secondary | ICD-10-CM | POA: Diagnosis not present

## 2019-11-19 DIAGNOSIS — G309 Alzheimer's disease, unspecified: Secondary | ICD-10-CM | POA: Diagnosis not present

## 2019-11-19 DIAGNOSIS — K589 Irritable bowel syndrome without diarrhea: Secondary | ICD-10-CM | POA: Diagnosis not present

## 2019-11-19 DIAGNOSIS — F419 Anxiety disorder, unspecified: Secondary | ICD-10-CM | POA: Diagnosis not present

## 2019-11-19 DIAGNOSIS — H9190 Unspecified hearing loss, unspecified ear: Secondary | ICD-10-CM | POA: Diagnosis not present

## 2019-11-19 DIAGNOSIS — D631 Anemia in chronic kidney disease: Secondary | ICD-10-CM | POA: Diagnosis not present

## 2019-11-19 DIAGNOSIS — C9140 Hairy cell leukemia not having achieved remission: Secondary | ICD-10-CM | POA: Diagnosis not present

## 2019-11-19 DIAGNOSIS — Z9181 History of falling: Secondary | ICD-10-CM | POA: Diagnosis not present

## 2019-11-19 DIAGNOSIS — D709 Neutropenia, unspecified: Secondary | ICD-10-CM | POA: Diagnosis not present

## 2019-11-19 DIAGNOSIS — N183 Chronic kidney disease, stage 3 unspecified: Secondary | ICD-10-CM | POA: Diagnosis not present

## 2019-11-19 DIAGNOSIS — I129 Hypertensive chronic kidney disease with stage 1 through stage 4 chronic kidney disease, or unspecified chronic kidney disease: Secondary | ICD-10-CM | POA: Diagnosis not present

## 2019-11-19 DIAGNOSIS — M545 Low back pain: Secondary | ICD-10-CM | POA: Diagnosis not present

## 2019-11-19 DIAGNOSIS — Z951 Presence of aortocoronary bypass graft: Secondary | ICD-10-CM | POA: Diagnosis not present

## 2019-11-19 DIAGNOSIS — K219 Gastro-esophageal reflux disease without esophagitis: Secondary | ICD-10-CM | POA: Diagnosis not present

## 2019-11-19 DIAGNOSIS — I2510 Atherosclerotic heart disease of native coronary artery without angina pectoris: Secondary | ICD-10-CM | POA: Diagnosis not present

## 2019-11-19 DIAGNOSIS — M1990 Unspecified osteoarthritis, unspecified site: Secondary | ICD-10-CM | POA: Diagnosis not present

## 2019-11-19 NOTE — Telephone Encounter (Signed)
Reviewed results and recommendations with patients son and he was agreeable for monitor to be sent. Will enroll in Zio for monitor.

## 2019-11-19 NOTE — Telephone Encounter (Signed)
Colletta [place] called and reported patient refused PT.  Jazzalyn Loewenstein,cma

## 2019-11-19 NOTE — Telephone Encounter (Signed)
Frank Mcdaniel with [facility] called to report that patient refused OT

## 2019-11-19 NOTE — Telephone Encounter (Signed)
Noted  

## 2019-11-22 ENCOUNTER — Ambulatory Visit (INDEPENDENT_AMBULATORY_CARE_PROVIDER_SITE_OTHER): Payer: PPO | Admitting: Family Medicine

## 2019-11-22 ENCOUNTER — Other Ambulatory Visit: Payer: Self-pay

## 2019-11-22 ENCOUNTER — Encounter: Payer: Self-pay | Admitting: Family Medicine

## 2019-11-22 DIAGNOSIS — D709 Neutropenia, unspecified: Secondary | ICD-10-CM

## 2019-11-22 DIAGNOSIS — R5081 Fever presenting with conditions classified elsewhere: Secondary | ICD-10-CM

## 2019-11-22 DIAGNOSIS — F419 Anxiety disorder, unspecified: Secondary | ICD-10-CM | POA: Diagnosis not present

## 2019-11-22 DIAGNOSIS — R9431 Abnormal electrocardiogram [ECG] [EKG]: Secondary | ICD-10-CM | POA: Diagnosis not present

## 2019-11-22 NOTE — Patient Instructions (Signed)
Nice to see you. Please monitor for any recurrent fevers or breathing issues. Please complete the heart monitor as planned.

## 2019-11-22 NOTE — Progress Notes (Signed)
[name], MD Phone: [phone]  Frank Mcdaniel is a 86 y.o. male who presents today for follow-up.  The patient was hospitalized from 11/08/2019-11/12/2019.  He was hospitalized for a SIRS reaction.  Found to have neutropenic fever with unclear etiology.  He grew out staph epidermidis and his blood cultures and urine though these were felt to be contaminant.  He was treated with antibiotics though was not discharged on any antibiotics.  COVID-19 test was negative.  Unclear if this was related to his transfusion reaction though the patient and his son both report that Dr. [name] did not think so when they spoke with him recently on follow-up.  He has not had any recurrent symptoms.  No fever.  No shortness of breath.  He feels like he is back to his baseline.  Has been doing his housework and walking.  He was found to have an abnormal EKG and is going to get a ZIO monitor.  His son reports some anxiety issues when things are out of kilter and do not go as planned.  His son questions whether or not he could get Xanax though he is unsure if that is the correct treatment.  He sees oncology again in May.  Discharge summary reviewed.  Medications reviewed.  Social History   Tobacco Use  Smoking Status Never Smoker  Smokeless Tobacco Never Used     ROS see history of present illness  Objective  Physical Exam Vitals:   11/22/19 1156  BP: (!) 100/50  Pulse: (!) 59  Temp: 97.7 F (36.5 C)  SpO2: 97%    BP Readings from Last 3 Encounters:  11/22/19 (!) 100/50  11/17/19 100/60  11/15/19 (!) 122/57   Wt Readings from Last 3 Encounters:  11/22/19 163 lb 12.8 oz (74.3 kg)  11/17/19 164 lb 4 oz (74.5 kg)  11/15/19 167 lb 4.8 oz (75.9 kg)    Physical Exam Constitutional:      General: He is not in acute distress.    Appearance: He is not diaphoretic.  Cardiovascular:     Rate and Rhythm: Normal rate. Rhythm regularly irregular.     Heart sounds: Normal heart sounds.   Pulmonary:     Effort: Pulmonary effort is normal.     Breath sounds: Normal breath sounds.  Musculoskeletal:     Right lower leg: No edema.     Left lower leg: No edema.  Skin:    General: Skin is warm and dry.  Neurological:     Mental Status: He is alert.      Assessment/Plan: Please see individual problem list.  Neutropenic fever ([place]) Undetermined cause.  Symptoms have resolved.  Will monitor for any infectious symptoms.  He will continue to see oncology.  Anxiety Advised that Xanax would not be a great option given his dementia.  Discussed that we could add something for him to take daily though this was deferred at this time.  They will try to stick to a good routine.  Abnormal EKG He will complete the ZIO monitor.    No orders of the defined types were placed in this encounter.   No orders of the defined types were placed in this encounter.   This visit occurred during the SARS-CoV-2 public health emergency.  Safety protocols were in place, including screening questions prior to the visit, additional usage of staff PPE, and extensive cleaning of exam room while observing appropriate contact time as indicated for disinfecting solutions.    [name],  MD [place]

## 2019-11-23 ENCOUNTER — Ambulatory Visit (INDEPENDENT_AMBULATORY_CARE_PROVIDER_SITE_OTHER): Payer: PPO

## 2019-11-23 DIAGNOSIS — R000 Tachycardia, unspecified: Secondary | ICD-10-CM

## 2019-11-23 DIAGNOSIS — R9431 Abnormal electrocardiogram [ECG] [EKG]: Secondary | ICD-10-CM | POA: Insufficient documentation

## 2019-11-23 NOTE — Assessment & Plan Note (Signed)
Undetermined cause.  Symptoms have resolved.  Will monitor for any infectious symptoms.  He will continue to see oncology.

## 2019-11-23 NOTE — Assessment & Plan Note (Signed)
Advised that Xanax would not be a great option given his dementia.  Discussed that we could add something for him to take daily though this was deferred at this time.  They will try to stick to a good routine.

## 2019-11-23 NOTE — Assessment & Plan Note (Signed)
He will complete the ZIO monitor.

## 2019-12-03 ENCOUNTER — Ambulatory Visit: Payer: PPO | Admitting: Family Medicine

## 2019-12-04 NOTE — Progress Notes (Signed)
[place]  Telephone:([phone] [phone]  ID: Frank Mcdaniel OB: 16-May-1933  MR#: [id number]  KGY#:[id number]  Patient Care Team: [name], MD as PCP - General (Family Medicine) Rockey Situ Kathlene November, MD as PCP - Cardiology (Cardiology) [name], MD as Consulting Physician (Cardiology) [name], MD as Consulting Physician (Oncology) [name] as Physician Assistant (Urology)  CHIEF COMPLAINT: Recurrent hairy cell leukemia.  INTERVAL HISTORY: Patient returns to clinic today for repeat laboratory work, further evaluation, and discussion on whether or not to reinitiate treatment.  He continues to have mild confusion, but otherwise feels well and is back to his baseline.  He does not complain of any weakness or fatigue today.  He has no neurologic complaints.  He denies any fevers.  He denies any chest pain, shortness of breath, cough, or hemoptysis.  He denies nausea, vomiting, diarrhea or constipation.  He has no urinary complaints.  Patient offers no specific complaints today.  REVIEW OF SYSTEMS:   Review of Systems  Constitutional: Negative.  Negative for fever, malaise/fatigue and weight loss.  Respiratory: Negative.  Negative for cough, hemoptysis and shortness of breath.   Cardiovascular: Negative.  Negative for chest pain and leg swelling.  Gastrointestinal: Negative.  Negative for abdominal pain.  Genitourinary: Negative.  Negative for dysuria.  Musculoskeletal: Negative.   Skin: Negative.  Negative for rash.  Neurological: Negative.  Negative for dizziness, focal weakness, weakness and headaches.  Psychiatric/Behavioral: The patient is not nervous/anxious.     As per HPI. Otherwise, a complete review of systems is negative.  PAST MEDICAL HISTORY: Past Medical History:  Diagnosis Date  . Anemia 09/01/2015  . Anxiety 09/11/2015  . Arthritis   . Asthma    as a teenager  . Asymptomatic Sinus  Bradycardia   . Atypical chest pain    a. 08/2014  . BPH (benign prostatic hyperplasia)   . CKD (chronic kidney disease), stage III   . Closed fracture of tuft of distal phalanx of finger 08/31/2018  . Coronary artery disease    a. 2000 s/p CABG;  b. 10/2008 Neg MV, EF 64%; c. cath 05/2015: LM 85% sev cal, pLAD-1 lesion 80%, pLAD-2 lesion 100% chronic, ostLCx 70% sev cal, OM4 90%, ostRCA 70%, mid RCA 90%, LIMA-LAD patent, VG-OM2 patent, VG-OM3 patent, VG-RPDA patent. No AS  . Dementia without behavioral disturbance ([place]) 05/28/2019  . GERD (gastroesophageal reflux disease)   . H/O echocardiogram    a. 10/2005 Echo: nl EF.  [name] Hairy cell leukemia ([place])   . HOH (hard of hearing)    Bilateral hearing aids  . HTN (hypertension)   . Hyperlipidemia   . Hypertension   . Leukemia ([place])   . Stricture and stenosis of esophagus     PAST SURGICAL HISTORY: Past Surgical History:  Procedure Laterality Date  . CARDIAC CATHETERIZATION N/A 05/26/2015   Procedure: Left Heart Cath;  Surgeon: [name], MD;  Location: [place] [facility];  Service: Cardiovascular;  Laterality: N/A;  . CARDIAC CATHETERIZATION N/A 05/26/2015   Procedure: Coronary/Graft Angiography;  Surgeon: [name], MD;  Location: [facility];  Service: Cardiovascular;  Laterality: N/A;  . CATARACT EXTRACTION    . CORONARY ARTERY BYPASS GRAFT  2000  . ESOPHAGOGASTRODUODENOSCOPY N/A 02/18/2017   Procedure: ESOPHAGOGASTRODUODENOSCOPY (EGD) with removal of food bolus;  Surgeon: [name], MD;  Location[facility];  Service: Endoscopy;  Laterality: N/A;  . ESOPHAGOGASTRODUODENOSCOPY (EGD) WITH PROPOFOL N/A 03/11/2017  Procedure: ESOPHAGOGASTRODUODENOSCOPY (EGD) WITH PROPOFOL;  Surgeon: [name], MD;  Location: [facility] ENDOSCOPY;  Service: Endoscopy;  Laterality: N/A;  . ESOPHAGOGASTRODUODENOSCOPY (EGD) WITH PROPOFOL N/A 04/15/2017   Procedure: ESOPHAGOGASTRODUODENOSCOPY (EGD) WITH PROPOFOL;  Surgeon: [name], MD;  Location: [facility] ENDOSCOPY;  Service: Endoscopy;  Laterality: N/A;  . EYE SURGERY Left    Cataract Extraction with IOL  . INGUINAL HERNIA REPAIR Left 06/24/2016   Procedure: HERNIA REPAIR INGUINAL ADULT;  Surgeon: [name], MD;  Location: [facility] ORS;  Service: General;  Laterality: Left;  . TONSILLECTOMY  1942   . UPPER GASTROINTESTINAL ENDOSCOPY  3 years ago    with Dilation.     FAMILY HISTORY Family History  Problem Relation Age of Onset  . Hypertension Mother   . Arthritis Father   . Other Other        no premature CAD.  [name] Colon cancer Neg Hx   . Prostate cancer Neg Hx   . Stomach cancer Neg Hx        ADVANCED DIRECTIVES:    HEALTH MAINTENANCE: Social History   Tobacco Use  . Smoking status: Never Smoker  . Smokeless tobacco: Never Used  Substance Use Topics  . Alcohol use: No  . Drug use: No     No Known Allergies  Current Outpatient Medications  Medication Sig Dispense Refill  . aspirin EC 81 MG tablet Take 81 mg by mouth daily.    [name] atorvastatin (LIPITOR) 40 MG tablet TAKE 1/2 TABLETS (20 MG TOTAL) BY MOUTH DAILY. 45 tablet 3  . Calcium Carbonate Antacid (TUMS CHEWY BITES PO) Take 1 tablet by mouth as needed.     [name] CLINPRO 5000 1.1 % PSTE APPLY PEASIZED AMOUNT, BRUSH DAILY AT LEAST 2 MINUTES. SPIT AFTER USE    . donepezil (ARICEPT) 10 MG tablet Take 10 mg by mouth at bedtime.    . isosorbide mononitrate (IMDUR) 30 MG 24 hr tablet TAKE 1 TABLET BY MOUTH EVERY DAY 90 tablet 3  . Multiple Vitamin (MULTIVITAMIN) tablet Take 1 tablet by mouth daily.      . ondansetron (ZOFRAN) 8 MG tablet Take 1 tablet (8 mg total) by mouth 2 (two) times daily as needed (Nausea or vomiting). 30 tablet 1  . polyethylene glycol (MIRALAX / GLYCOLAX) packet Take 17 g by mouth daily as needed.     . prochlorperazine (COMPAZINE) 10 MG tablet Take 1 tablet (10 mg total) by mouth every 6 (six) hours as needed (Nausea or vomiting). 60 tablet 2  . tamsulosin (FLOMAX) 0.4  MG CAPS capsule TAKE 1 CAPSULES (0.4 MG TOTAL) BY MOUTH DAILY. 30 capsule 3   No current facility-administered medications for this visit.    OBJECTIVE: Vitals:   12/06/19 1448  BP: 117/67  Pulse: 60  Resp: 18  Temp: 97.7 F (36.5 C)  SpO2: 99%     Body mass index is 24.94 kg/m.    ECOG FS:0 - Asymptomatic  General: Thin, no acute distress. Eyes: Pink conjunctiva, anicteric sclera. HEENT: Normocephalic, moist mucous membranes. Lungs: No audible wheezing or coughing. Heart: Regular rate and rhythm. Abdomen: Soft, nontender, no obvious distention. Musculoskeletal: No edema, cyanosis, or clubbing. Neuro: Alert, answering all questions appropriately. Cranial nerves grossly intact. Skin: No rashes or petechiae noted. Psych: Normal affect.   LAB RESULTS:  Lab Results  Component Value Date   NA 138 12/06/2019   K 3.7 12/06/2019   CL 105 12/06/2019   CO2 26 12/06/2019   GLUCOSE 107 (H)  12/06/2019   BUN 21 12/06/2019   CREATININE 1.49 (H) 12/06/2019   CALCIUM 8.6 (L) 12/06/2019   PROT 5.7 (L) 11/12/2019   ALBUMIN 3.3 (L) 11/12/2019   AST 30 11/12/2019   ALT 17 11/12/2019   ALKPHOS 50 11/12/2019   BILITOT 0.8 11/12/2019   GFRNONAA 42 (L) 12/06/2019   GFRAA 49 (L) 12/06/2019    Lab Results  Component Value Date   WBC 1.6 (L) 12/06/2019   NEUTROABS 1.2 (L) 12/06/2019   HGB 9.9 (L) 12/06/2019   HCT 29.3 (L) 12/06/2019   MCV 101.4 (H) 12/06/2019   PLT 105 (L) 12/06/2019     STUDIES: CT Angio Chest PE W and/or Wo Contrast  Result Date: 11/08/2019 CLINICAL DATA:  Hypoxia, fever EXAM: CT ANGIOGRAPHY CHEST WITH CONTRAST TECHNIQUE: Multidetector CT imaging of the chest was performed using the standard protocol during bolus administration of intravenous contrast. Multiplanar CT image reconstructions and MIPs were obtained to evaluate the vascular anatomy. CONTRAST:  28m OMNIPAQUE IOHEXOL 350 MG/ML SOLN COMPARISON:  01/13/2015 FINDINGS: Cardiovascular: No filling defects  in the pulmonary arteries to suggest pulmonary emboli. Heart is normal size. Prior CABG. Aortic atherosclerosis. No aneurysm. Mediastinum/Nodes: No mediastinal, hilar, or axillary adenopathy. Small hiatal hernia. Esophagus mildly distended with gas and fluid may be related to reflux. Distal esophageal wall appears mildly thickened, possibly related to reflux esophagitis. Lungs/Pleura: Lungs are clear. No focal airspace opacities or suspicious nodules. No effusions. Upper Abdomen: Gallstone noted with fluid in the gallbladder, stable. No acute findings. Musculoskeletal: Chest wall soft tissues are unremarkable. No acute bony abnormality. Review of the MIP images confirms the above findings. IMPRESSION: No evidence of pulmonary embolus. Prior CABG. Small hiatal hernia. Distal esophageal wall thickening and mild distention of the esophagus with air and fluid. Findings may be related to reflux. Aortic Atherosclerosis (ICD10-I70.0). Electronically Signed   By: KRolm BaptiseM.D.   On: 11/08/2019 17:46   DG Chest Port 1 View  Result Date: 11/08/2019 CLINICAL DATA:  Shortness of breath EXAM: PORTABLE CHEST 1 VIEW COMPARISON:  04/10/2016 FINDINGS: Prior CABG. Heart is normal size. Low lung volumes. No confluent opacities or effusions. No acute bony abnormality. IMPRESSION: Low lung volumes.  No acute cardiopulmonary disease. Electronically Signed   By: KRolm BaptiseM.D.   On: 11/08/2019 17:24   ECHOCARDIOGRAM COMPLETE  Result Date: 11/12/2019    ECHOCARDIOGRAM REPORT   Patient Name:   MJABBAR PALMERODate of Exam: 11/12/2019 Medical Rec #:  [id number]        Height:       69.0 in Accession #:    [id number]       Weight:       168.4 lb Date of Birth:  [birth year]        BSA:          1.921 m Patient Age:    89years          BP:           122/79 mmHg Patient Gender: M                 HR:           77 bpm. Exam Location:  [facility] Procedure: 2D Echo, Color Doppler, Cardiac Doppler and Intracardiac            Opacification  Agent Indications:     R78.81 Bacteremia  History:         Patient has prior history  of Echocardiogram examinations. Prior                  CABG, CKD; Risk Factors:Hypertension and Dyslipidemia.  Sonographer:     Charmayne Sheer RDCS (AE) Referring Phys:  [id number] Wyvonnia Dusky Diagnosing Phys: Ida Rogue MD  Sonographer Comments: Suboptimal parasternal window and suboptimal subcostal window. Image acquisition challenging due to respiratory motion. IMPRESSIONS  1. Left ventricular ejection fraction, by estimation, is 60 to 65%. The left ventricle has normal function. The left ventricle has no regional wall motion abnormalities. Left ventricular diastolic parameters are consistent with Grade I diastolic dysfunction (impaired relaxation).  2. Right ventricular systolic function is normal. The right ventricular size is normal. Tricuspid regurgitation signal is inadequate for assessing PA pressure.  3. Frequent ectopy noted. FINDINGS  Left Ventricle: Left ventricular ejection fraction, by estimation, is 60 to 65%. The left ventricle has normal function. The left ventricle has no regional wall motion abnormalities. Definity contrast agent was given IV to delineate the left ventricular  endocardial borders. The left ventricular internal cavity size was normal in size. There is no left ventricular hypertrophy. Left ventricular diastolic parameters are consistent with Grade I diastolic dysfunction (impaired relaxation). Right Ventricle: The right ventricular size is normal. No increase in right ventricular wall thickness. Right ventricular systolic function is normal. Tricuspid regurgitation signal is inadequate for assessing PA pressure. Left Atrium: Left atrial size was normal in size. Right Atrium: Right atrial size was normal in size. Pericardium: There is no evidence of pericardial effusion. Mitral Valve: The mitral valve is normal in structure. Normal mobility of the mitral valve leaflets. Mild mitral valve  regurgitation. No evidence of mitral valve stenosis. MV peak gradient, 5.2 mmHg. The mean mitral valve gradient is 1.0 mmHg. Tricuspid Valve: The tricuspid valve is normal in structure. Tricuspid valve regurgitation is mild . No evidence of tricuspid stenosis. Aortic Valve: The aortic valve was not well visualized. Aortic valve regurgitation is not visualized. No aortic stenosis is present. Aortic valve mean gradient measures 6.0 mmHg. Aortic valve peak gradient measures 9.9 mmHg. Aortic valve area, by VTI measures 2.04 cm. Pulmonic Valve: The pulmonic valve was normal in structure. Pulmonic valve regurgitation is not visualized. No evidence of pulmonic stenosis. Aorta: The aortic root is normal in size and structure. Venous: The inferior vena cava is normal in size with greater than 50% respiratory variability, suggesting right atrial pressure of 3 mmHg. IAS/Shunts: No atrial level shunt detected by color flow Doppler.  LEFT VENTRICLE PLAX 2D LVIDd:         3.77 cm  Diastology LVIDs:         2.39 cm  LV e' lateral:   11.00 cm/s LV PW:         0.98 cm  LV E/e' lateral: 6.7 LV IVS:        0.76 cm  LV e' medial:    6.85 cm/s LVOT diam:     2.00 cm  LV E/e' medial:  10.7 LV SV:         62 LV SV Index:   32 LVOT Area:     3.14 cm  RIGHT VENTRICLE RV Basal diam:  2.76 cm LEFT ATRIUM             Index       RIGHT ATRIUM          Index LA diam:        3.40 cm 1.77 cm/m  RA Area:  9.05 cm LA Vol (A2C):   47.7 ml 24.84 ml/m RA Volume:   12.60 ml 6.56 ml/m LA Vol (A4C):   46.7 ml 24.32 ml/m LA Biplane Vol: 48.0 ml 24.99 ml/m  AORTIC VALVE                    PULMONIC VALVE AV Area (Vmax):    2.16 cm     PV Vmax:       1.25 m/s AV Area (Vmean):   1.94 cm     PV Vmean:      85.700 cm/s AV Area (VTI):     2.04 cm     PV VTI:        0.256 m AV Vmax:           157.00 cm/s  PV Peak grad:  6.2 mmHg AV Vmean:          109.000 cm/s PV Mean grad:  3.0 mmHg AV VTI:            0.305 m AV Peak Grad:      9.9 mmHg AV Mean  Grad:      6.0 mmHg LVOT Vmax:         108.00 cm/s LVOT Vmean:        67.400 cm/s LVOT VTI:          0.198 m LVOT/AV VTI ratio: 0.65  AORTA Ao Root diam: 3.20 cm MITRAL VALVE MV Area (PHT): 2.50 cm    SHUNTS MV Peak grad:  5.2 mmHg    Systemic VTI:  0.20 m MV Mean grad:  1.0 mmHg    Systemic Diam: 2.00 cm MV Vmax:       1.14 m/s MV Vmean:      52.5 cm/s MV Decel Time: 303 msec MV E velocity: 73.20 cm/s MV A velocity: 97.50 cm/s MV E/A ratio:  0.75 Ida Rogue MD Electronically signed by Ida Rogue MD Signature Date/Time: 11/12/2019/5:07:04 PM    Final    ONCOLOGY HISTORY:  Patient initially received cladribine in 2006 and then was treated for a relapse in 2011 and again in 2014. Bone marrow biopsy at that time confirmed recurrence of disease with 80-90% hairy cell leukemia noted in his specimen. Patient completed cladribine 0.15 mg/kg on days 1 through 5 along with 8 weekly cycles of Rituxan on April 18, 2015. Bone marrow biopsy results after completion of 8 weeks of Rituxan without evidence of residual disease.  Patient typically has no evidence of disease and peripheral blood.   ASSESSMENT: Recurrent hairy cell leukemia.  PLAN:   1.  Recurrent hairy cell leukemia: See oncology history as above.  Bone marrow biopsy on November 02, 2019 revealed recurrent hairy cell leukemia, but percentage of involvement was not reported.  Initial plan was to restart cladribine and Rituxan last week.  Patient received day 1 of treatment, but then was admitted to the hospital.  It does not appear his symptoms or reason for admission was related to treatment.  Although patient remains pancytopenic, his blood counts remain relatively stable and he is asymptomatic.  Patient does not wish to reinitiate treatment at this time, but would consider if his pancytopenia became more profound.  No intervention is needed.  Return to clinic in 1 month for laboratory work only and then in 2 months for laboratory work and further  evaluation.   2. Thrombocytopenia: Chronic and unchanged.  Patient's platelet count is 105 today.  Would consider retreatment if platelets began  to decreased and approached 50. 3.  Leukopenia: Chronic and unchanged.  Patient's ANC is 1.2.  Would consider retreatment if patient became persistently neutropenic less than 1.0. 4.  Anemia: Chronic and unchanged.  Patient's hemoglobin is 9.9 today. 5.  Cardiac disease: Patient currently has a monitor on and will return device to cardiology tomorrow.  Continue follow-up and treatment per Dr. Rockey Situ.     Patient expressed understanding and was in agreement with this plan. He also understands that He can call clinic at any time with any questions, concerns, or complaints.    [name], MD 12/06/19 4:04 PM

## 2019-12-06 ENCOUNTER — Other Ambulatory Visit: Payer: Self-pay

## 2019-12-06 ENCOUNTER — Inpatient Hospital Stay: Payer: PPO | Attending: Oncology

## 2019-12-06 ENCOUNTER — Inpatient Hospital Stay (HOSPITAL_BASED_OUTPATIENT_CLINIC_OR_DEPARTMENT_OTHER): Payer: PPO | Admitting: Oncology

## 2019-12-06 ENCOUNTER — Encounter: Payer: Self-pay | Admitting: Oncology

## 2019-12-06 VITALS — BP 117/67 | HR 60 | Temp 97.7°F | Resp 18 | Wt 164.0 lb

## 2019-12-06 DIAGNOSIS — R410 Disorientation, unspecified: Secondary | ICD-10-CM | POA: Diagnosis not present

## 2019-12-06 DIAGNOSIS — Z79899 Other long term (current) drug therapy: Secondary | ICD-10-CM | POA: Insufficient documentation

## 2019-12-06 DIAGNOSIS — D61818 Other pancytopenia: Secondary | ICD-10-CM | POA: Insufficient documentation

## 2019-12-06 DIAGNOSIS — C9140 Hairy cell leukemia not having achieved remission: Secondary | ICD-10-CM | POA: Diagnosis not present

## 2019-12-06 DIAGNOSIS — M1990 Unspecified osteoarthritis, unspecified site: Secondary | ICD-10-CM | POA: Insufficient documentation

## 2019-12-06 DIAGNOSIS — Z951 Presence of aortocoronary bypass graft: Secondary | ICD-10-CM | POA: Diagnosis not present

## 2019-12-06 DIAGNOSIS — I2510 Atherosclerotic heart disease of native coronary artery without angina pectoris: Secondary | ICD-10-CM | POA: Diagnosis not present

## 2019-12-06 DIAGNOSIS — N183 Chronic kidney disease, stage 3 unspecified: Secondary | ICD-10-CM | POA: Insufficient documentation

## 2019-12-06 DIAGNOSIS — Z7982 Long term (current) use of aspirin: Secondary | ICD-10-CM | POA: Insufficient documentation

## 2019-12-06 DIAGNOSIS — N400 Enlarged prostate without lower urinary tract symptoms: Secondary | ICD-10-CM | POA: Insufficient documentation

## 2019-12-06 DIAGNOSIS — C9142 Hairy cell leukemia, in relapse: Secondary | ICD-10-CM

## 2019-12-06 DIAGNOSIS — E785 Hyperlipidemia, unspecified: Secondary | ICD-10-CM | POA: Insufficient documentation

## 2019-12-06 DIAGNOSIS — K219 Gastro-esophageal reflux disease without esophagitis: Secondary | ICD-10-CM | POA: Diagnosis not present

## 2019-12-06 DIAGNOSIS — I129 Hypertensive chronic kidney disease with stage 1 through stage 4 chronic kidney disease, or unspecified chronic kidney disease: Secondary | ICD-10-CM | POA: Insufficient documentation

## 2019-12-06 LAB — BASIC METABOLIC PANEL
Anion gap: 7 (ref 5–15)
BUN: 21 mg/dL (ref 8–23)
CO2: 26 mmol/L (ref 22–32)
Calcium: 8.6 mg/dL — ABNORMAL LOW (ref 8.9–10.3)
Chloride: 105 mmol/L (ref 98–111)
Creatinine, Ser: 1.49 mg/dL — ABNORMAL HIGH (ref 0.61–1.24)
GFR calc Af Amer: 49 mL/min — ABNORMAL LOW (ref >60)
GFR calc non Af Amer: 42 mL/min — ABNORMAL LOW (ref >60)
Glucose, Bld: 107 mg/dL — ABNORMAL HIGH (ref 70–99)
Potassium: 3.7 mmol/L (ref 3.5–5.1)
Sodium: 138 mmol/L (ref 135–145)

## 2019-12-06 LAB — CBC WITH DIFFERENTIAL/PLATELET
Abs Immature Granulocytes: 0.02 10*3/uL (ref 0.00–0.07)
Basophils Absolute: 0 10*3/uL (ref 0.0–0.1)
Basophils Relative: 0 %
Eosinophils Absolute: 0 10*3/uL (ref 0.0–0.5)
Eosinophils Relative: 2 %
HCT: 29.3 % — ABNORMAL LOW (ref 39.0–52.0)
Hemoglobin: 9.9 g/dL — ABNORMAL LOW (ref 13.0–17.0)
Immature Granulocytes: 1 %
Lymphocytes Relative: 16 %
Lymphs Abs: 0.3 10*3/uL — ABNORMAL LOW (ref 0.7–4.0)
MCH: 34.3 pg — ABNORMAL HIGH (ref 26.0–34.0)
MCHC: 33.8 g/dL (ref 30.0–36.0)
MCV: 101.4 fL — ABNORMAL HIGH (ref 80.0–100.0)
Monocytes Absolute: 0 10*3/uL — ABNORMAL LOW (ref 0.1–1.0)
Monocytes Relative: 3 %
Neutro Abs: 1.2 10*3/uL — ABNORMAL LOW (ref 1.7–7.7)
Neutrophils Relative %: 78 %
Platelets: 105 10*3/uL — ABNORMAL LOW (ref 150–400)
RBC: 2.89 MIL/uL — ABNORMAL LOW (ref 4.22–5.81)
RDW: 14.7 % (ref 11.5–15.5)
WBC: 1.6 10*3/uL — ABNORMAL LOW (ref 4.0–10.5)
nRBC: 0 % (ref 0.0–0.2)

## 2019-12-06 NOTE — Progress Notes (Signed)
Pt here for follow up. No complaints or concerns.  

## 2019-12-22 DIAGNOSIS — R000 Tachycardia, unspecified: Secondary | ICD-10-CM | POA: Diagnosis not present

## 2019-12-28 ENCOUNTER — Other Ambulatory Visit: Payer: Self-pay | Admitting: *Deleted

## 2019-12-28 DIAGNOSIS — R000 Tachycardia, unspecified: Secondary | ICD-10-CM

## 2019-12-31 ENCOUNTER — Ambulatory Visit (INDEPENDENT_AMBULATORY_CARE_PROVIDER_SITE_OTHER): Payer: PPO | Admitting: Nurse Practitioner

## 2019-12-31 ENCOUNTER — Encounter: Payer: Self-pay | Admitting: Nurse Practitioner

## 2019-12-31 ENCOUNTER — Other Ambulatory Visit: Payer: Self-pay

## 2019-12-31 VITALS — BP 136/78 | HR 56 | Temp 98.0°F | Ht 68.0 in | Wt 165.0 lb

## 2019-12-31 DIAGNOSIS — K625 Hemorrhage of anus and rectum: Secondary | ICD-10-CM | POA: Diagnosis not present

## 2019-12-31 DIAGNOSIS — R413 Other amnesia: Secondary | ICD-10-CM | POA: Diagnosis not present

## 2019-12-31 DIAGNOSIS — R197 Diarrhea, unspecified: Secondary | ICD-10-CM

## 2019-12-31 DIAGNOSIS — A0472 Enterocolitis due to Clostridium difficile, not specified as recurrent: Secondary | ICD-10-CM | POA: Insufficient documentation

## 2019-12-31 DIAGNOSIS — C9142 Hairy cell leukemia, in relapse: Secondary | ICD-10-CM

## 2019-12-31 LAB — COMPREHENSIVE METABOLIC PANEL
ALT: 13 U/L (ref 0–53)
AST: 20 U/L (ref 0–37)
Albumin: 4.2 g/dL (ref 3.5–5.2)
Alkaline Phosphatase: 67 U/L (ref 39–117)
BUN: 16 mg/dL (ref 6–23)
CO2: 25 mEq/L (ref 19–32)
Calcium: 9.2 mg/dL (ref 8.4–10.5)
Chloride: 107 mEq/L (ref 96–112)
Creatinine, Ser: 1.31 mg/dL (ref 0.40–1.50)
GFR: 51.96 mL/min — ABNORMAL LOW (ref >60.00)
Glucose, Bld: 94 mg/dL (ref 70–99)
Potassium: 3.9 mEq/L (ref 3.5–5.1)
Sodium: 140 mEq/L (ref 135–145)
Total Bilirubin: 0.6 mg/dL (ref 0.2–1.2)
Total Protein: 6.2 g/dL (ref 6.0–8.3)

## 2019-12-31 MED ORDER — VANCOMYCIN HCL 125 MG PO CAPS: 125.0000 mg | ORAL_CAPSULE | Freq: Four times a day (QID) | ORAL | 0 refills | Status: AC

## 2019-12-31 NOTE — Progress Notes (Signed)
Established Patient Office Visit  Subjective:  Patient ID: Frank Mcdaniel, male    DOB: 06/23/1933  Age: 86 y.o. MRN: [id number]  CC:  Chief Complaint  Patient presents with  . Acute Visit    blood in stool    HPI Frank Mcdaniel is an 86 year old patient with hypertension, CAD, active hairy cell leukemia who presents with acute onset diarrhea over the last several days.  He saw blood in the stool today so his son, [name] brought him in for evaluation.  Patient reports passing thick mushy stools 3 or more times a day when he wakes up 2-3 times at night with the urgent need to have a bowel movement.  He has seen brown stool with small amount of fresh blood in it only once today. There was no blood in the toilet bowl. He has had 2 loose stools today, woke up twice last night. He denies any abdominal pain or cramps, hemorrhoidal history, or rectal itching burning or pain.  No fevers or chills.  He denies nausea, vomiting> He maintains a normal appetite and diet.  He is well-hydrated and reports the diarrhea was never profuse or liquid.  Patient feels well.  He denies any dizziness, lightheadedness, chest pain or shortness of breath. He live with his son. No falls. He reports no exposure to bad food, or eating out.  No ill contacts in the family. Vaccinated against Covid and no Covid exposure.  He did have a relapse of his hairy cell leukemia and began treatment in April.  He developed rigor, was hospitalized.  He received several antibiotic last month and did not have diarrhea with them.  He is followed actively by Dr. [name] in oncology.     Lab Results  Component Value Date   WBC 1.6 (L) 12/06/2019   HGB 9.9 (L) 12/06/2019   HCT 29.3 (L) 12/06/2019   MCV 101.4 (H) 12/06/2019   PLT 105 (L) 12/06/2019                          Past Medical History:  Diagnosis Date  . Anemia 09/01/2015  . Anxiety 09/11/2015  . Arthritis   . Asthma    as a teenager  . Asymptomatic Sinus Bradycardia    . Atypical chest pain    a. 08/2014  . BPH (benign prostatic hyperplasia)   . CKD (chronic kidney disease), stage III   . Closed fracture of tuft of distal phalanx of finger 08/31/2018  . Coronary artery disease    a. 2000 s/p CABG;  b. 10/2008 Neg MV, EF 64%; c. cath 05/2015: LM 85% sev cal, pLAD-1 lesion 80%, pLAD-2 lesion 100% chronic, ostLCx 70% sev cal, OM4 90%, ostRCA 70%, mid RCA 90%, LIMA-LAD patent, VG-OM2 patent, VG-OM3 patent, VG-RPDA patent. No AS  . Dementia without behavioral disturbance ([place]) 05/28/2019  . GERD (gastroesophageal reflux disease)   . H/O echocardiogram    a. 10/2005 Echo: nl EF.  [name] Hairy cell leukemia ([place])   . HOH (hard of hearing)    Bilateral hearing aids  . HTN (hypertension)   . Hyperlipidemia   . Hypertension   . Leukemia ([place])   . Stricture and stenosis of esophagus     Past Surgical History:  Procedure Laterality Date  . CARDIAC CATHETERIZATION N/A 05/26/2015   Procedure: Left Heart Cath;  Surgeon: [name], MD;  Location: [facility];  Service: Cardiovascular;  Laterality: N/A;  .  CARDIAC CATHETERIZATION N/A 05/26/2015   Procedure: Coronary/Graft Angiography;  Surgeon: [name], MD;  Location: [facility];  Service: Cardiovascular;  Laterality: N/A;  . CATARACT EXTRACTION    . CORONARY ARTERY BYPASS GRAFT  2000  . ESOPHAGOGASTRODUODENOSCOPY N/A 02/18/2017   Procedure: ESOPHAGOGASTRODUODENOSCOPY (EGD) with removal of food bolus;  Surgeon: [name], MD;  Location: [facility];  Service: Endoscopy;  Laterality: N/A;  . ESOPHAGOGASTRODUODENOSCOPY (EGD) WITH PROPOFOL N/A 03/11/2017   Procedure: ESOPHAGOGASTRODUODENOSCOPY (EGD) WITH PROPOFOL;  Surgeon: [name], MD;  Location: [facility] ENDOSCOPY;  Service: Endoscopy;  Laterality: N/A;  . ESOPHAGOGASTRODUODENOSCOPY (EGD) WITH PROPOFOL N/A 04/15/2017   Procedure: ESOPHAGOGASTRODUODENOSCOPY (EGD) WITH PROPOFOL;  Surgeon: [name], MD;  Location: [facility] ENDOSCOPY;   Service: Endoscopy;  Laterality: N/A;  . EYE SURGERY Left    Cataract Extraction with IOL  . INGUINAL HERNIA REPAIR Left 06/24/2016   Procedure: HERNIA REPAIR INGUINAL ADULT;  Surgeon: [name], MD;  Location: [facility];  Service: General;  Laterality: Left;  . TONSILLECTOMY  1942   . UPPER GASTROINTESTINAL ENDOSCOPY  3 years ago    with Dilation.     Family History  Problem Relation Age of Onset  . Hypertension Mother   . Arthritis Father   . Other Other        no premature CAD.  [name] Colon cancer Neg Hx   . Prostate cancer Neg Hx   . Stomach cancer Neg Hx     Social History   Socioeconomic History  . Marital status: Widowed    Spouse name: Not on file  . Number of children: 1  . Years of education: Not on file  . Highest education level: Not on file  Occupational History  . Occupation: retired  Tobacco Use  . Smoking status: Never Smoker  . Smokeless tobacco: Never Used  Substance and Sexual Activity  . Alcohol use: No  . Drug use: No  . Sexual activity: Never  Other Topics Concern  . Not on file  Social History Narrative   Lives locally with son.  Fairly active around the house though does not routinely exercise.  Retired from [employer].   Social Determinants of Health   Financial Resource Strain:   . Difficulty of Paying Living Expenses:   Food Insecurity:   . Worried About Charity fundraiser in the Last Year:   . Arboriculturist in the Last Year:   Transportation Needs:   . Film/video editor (Medical):   [name] Lack of Transportation (Non-Medical):   Physical Activity:   . Days of Exercise per Week:   . Minutes of Exercise per Session:   Stress: No Stress Concern Present  . Feeling of Stress : Not at all  Social Connections:   . Frequency of Communication with Friends and Family:   . Frequency of Social Gatherings with Friends and Family:   . Attends Religious Services:   . Active Member of Clubs or Organizations:   . Attends Archivist  Meetings:   [name] Marital Status:   Intimate Partner Violence:   . Fear of Current or Ex-Partner:   . Emotionally Abused:   [name] Physically Abused:   . Sexually Abused:     Outpatient Medications Prior to Visit  Medication Sig Dispense Refill  . aspirin EC 81 MG tablet Take 81 mg by mouth daily.    [name] atorvastatin (LIPITOR) 40 MG tablet TAKE 1/2 TABLETS (20 MG TOTAL) BY MOUTH DAILY. Joes  tablet 3  . Calcium Carbonate Antacid (TUMS CHEWY BITES PO) Take 1 tablet by mouth as needed.     [name] CLINPRO 5000 1.1 % PSTE APPLY PEASIZED AMOUNT, BRUSH DAILY AT LEAST 2 MINUTES. SPIT AFTER USE    . donepezil (ARICEPT) 10 MG tablet Take 10 mg by mouth at bedtime.    . isosorbide mononitrate (IMDUR) 30 MG 24 hr tablet TAKE 1 TABLET BY MOUTH EVERY DAY 90 tablet 3  . Multiple Vitamin (MULTIVITAMIN) tablet Take 1 tablet by mouth daily.      . ondansetron (ZOFRAN) 8 MG tablet Take 1 tablet (8 mg total) by mouth 2 (two) times daily as needed (Nausea or vomiting). 30 tablet 1  . polyethylene glycol (MIRALAX / GLYCOLAX) packet Take 17 g by mouth daily as needed.     . prochlorperazine (COMPAZINE) 10 MG tablet Take 1 tablet (10 mg total) by mouth every 6 (six) hours as needed (Nausea or vomiting). 60 tablet 2  . tamsulosin (FLOMAX) 0.4 MG CAPS capsule TAKE 1 CAPSULES (0.4 MG TOTAL) BY MOUTH DAILY. 30 capsule 3   No facility-administered medications prior to visit.    No Known Allergies  ROS Pertinent positives noted in history of present illness otherwise negative.    Objective:    Physical Exam  Constitutional: He is oriented to person, place, and time. He appears well-developed and well-nourished.  HENT:  Head: Normocephalic.  Eyes: Pupils are equal, round, and reactive to light.  Cardiovascular: Normal rate and regular rhythm.  Pulmonary/Chest: Effort normal and breath sounds normal.  Abdominal: Soft. There is no abdominal tenderness.  Genitourinary:    Genitourinary Comments: DRE, no stool in the vault and  lubricant hemoccult neg. Prostate is hard and enlarged. Ext hemorrhoid tags- no fissure, tear or visible bleeding.    Musculoskeletal:        General: No edema. Normal range of motion.     Cervical back: Normal range of motion.  Neurological: He is alert and oriented to person, place, and time.  Skin: Skin is warm and dry.  Psychiatric: He has a normal mood and affect. His behavior is normal.  Memory deficit. Very HOH despite hearing aides and that maybe contributing.   Vitals reviewed.   BP 136/78 (BP Location: Left Arm, Patient Position: Sitting, Cuff Size: Normal)   Pulse (!) 56   Temp 98 F (36.7 C) (Skin)   Ht 5' 8" (1.727 m)   Wt 165 lb (74.8 kg)   SpO2 97%   BMI 25.09 kg/m  Wt Readings from Last 3 Encounters:  12/31/19 165 lb (74.8 kg)  12/06/19 164 lb (74.4 kg)  11/22/19 163 lb 12.8 oz (74.3 kg)     There are no preventive care reminders to display for this patient.  There are no preventive care reminders to display for this patient.  Lab Results  Component Value Date   TSH 0.65 05/28/2019   Lab Results  Component Value Date   WBC 1.6 (L) 12/06/2019   HGB 9.9 (L) 12/06/2019   HCT 29.3 (L) 12/06/2019   MCV 101.4 (H) 12/06/2019   PLT 105 (L) 12/06/2019   Lab Results  Component Value Date   NA 140 12/31/2019   K 3.9 12/31/2019   CO2 25 12/31/2019   GLUCOSE 94 12/31/2019   BUN 16 12/31/2019   CREATININE 1.31 12/31/2019   BILITOT 0.6 12/31/2019   ALKPHOS 67 12/31/2019   AST 20 12/31/2019   ALT 13 12/31/2019   PROT 6.2 12/31/2019  ALBUMIN 4.2 12/31/2019   CALCIUM 9.2 12/31/2019   ANIONGAP 7 12/06/2019   GFR 51.96 (L) 12/31/2019   Lab Results  Component Value Date   CHOL 131 12/10/2018   Lab Results  Component Value Date   HDL 53.90 12/10/2018   Lab Results  Component Value Date   LDLCALC 65 12/10/2018   Lab Results  Component Value Date   TRIG 59.0 12/10/2018   Lab Results  Component Value Date   CHOLHDL 2 12/10/2018   No results  found for: HGBA1C    Assessment & Plan:   Problem List Items Addressed This Visit      Digestive   Diarrhea of presumed infectious origin - Primary   Relevant Orders   CBC with Differential/Platelet   Comp Met (CMET) (Completed)   Clostridium difficile culture-fecal   Gastrointestinal Panel by PCR , Stool (Completed)   Ambulatory referral to Gastroenterology   BRBPR (bright red blood per rectum)   Relevant Orders   CBC with Differential/Platelet   Ambulatory referral to Gastroenterology     Other   Hairy cell leukemia ([place]) (Chronic)   Relevant Medications   vancomycin (VANCOCIN) 125 MG capsule      Meds ordered this encounter  Medications  . vancomycin (VANCOCIN) 125 MG capsule    Sig: Take 1 capsule (125 mg total) by mouth 4 (four) times daily for 10 days.    Dispense:  40 capsule    Refill:  0    Order Specific Question:   Supervising Provider    Answer:   Einar Pheasant [[id number]]   Acute onset of change in bowel habits with loss of formed stool.  Frequent stools 3-4 times a day and wakes up at night 2-3 times with diarrhea. He has had exposure to antibiotics last month to consider C. difficile infection. He reports no exposure to bad food, or eating out.  No ill contacts in the family.  He was fully vaccinated against Covid in February.  He does not present with Covid-like symptoms.  He is well-appearing with isolated diarrhea suggesting a bacterial cause.   He saw BRBPR in stool today. DRE showed no active GI bleed noted. No rectal irritation or fissure, tears or thrombosed hemorrhoid. He could have source of his blood from [facility], local irritation not seen or malignancy. Referral to gastroenterology for further evaluation and management.  Hairy cell leukemia in relapse: Will Check Hgb- baseline 9.9 .  Continue to follow with Dr. [name] in oncology  Prostate abnormal on palpation:Talk with PCP about Urology referral.   Patient was advised:  Please turn in stool cups  to the [place] at [facility] - outpatient lab . They can run your sample over the long weekend.   AFTER - you collect the stool samples in the vials. Then, you may begin on Vancomycin- an antibiotic used to treat C difficile bacterial infection and take this until I get the results back. I will call you if we have to change therapy based on the results.   Clean his bathroom with Clorox bleach. Use separate bathrooms while we are waiting on the cultures to come back.   If you develop bleeding that is in the commode or the diarrhea gets worse or you get abdominal pain or fevers/chills- please go to the emergency room to get re evaluated.   Hydrate well. Do not eat nuts/seeds/popcorn or high fiber now.   Laboratory addendum 01/01/2020: Stool culture PCR was negative.  Stool for C. difficile was  positive.  His son [name] was notified.  Patient has already started on the vancomycin and tolerating well. [name]  has cleaned  with the bleach as directed.  He was educated about the nature of C. difficile, the risk of relapse and notify the office if the patient develops diarrhea again.  Follow-up: Return in about 1 week (around 01/07/2020).   This visit occurred during the SARS-CoV-2 public health emergency.  Safety protocols were in place, including screening questions prior to the visit, additional usage of staff PPE, and extensive cleaning of exam room while observing appropriate contact time as indicated for disinfecting solutions.   Denice Paradise, NP

## 2019-12-31 NOTE — Patient Instructions (Addendum)
It was nice to meet you and Frank Mcdaniel today.   Please go to the lab today.   Please turn in stool cups to the [place] at [facility] - outpatient lab . They can run your sample over the long weekend.   AFTER - you collect the stool samples in the vials. Then, you may begin on Vancomycin- an antibiotic used to treat C difficile bacterial infection and take this until I get the results back. I will call you if we have to change therapy based on the results.   Clean his bathroom with Clorox bleach.   Use separate bathrooms while we are waiting on the cultures to come back.   If you develop bleeding that is in the commode or the diarrhea gets worse or you get abdominal pain or fevers/chills- please go to the emergency room to get re evaluated.   Hydrate well. Do not eat nuts/seeds/popcorn or high fiber now.  Diarrhea, Adult Diarrhea is when you pass loose and watery poop (stool) often. Diarrhea can make you feel weak and cause you to lose water in your body (get dehydrated). Losing water in your body can cause you to:  Feel tired and thirsty.  Have a dry mouth.  Go pee (urinate) less often. Diarrhea often lasts 2-3 days. However, it can last longer if it is a sign of something more serious. It is important to treat your diarrhea as told by your doctor. Follow these instructions at home: Eating and drinking     Follow these instructions as told by your doctor:  Take an ORS (oral rehydration solution). This is a drink that helps you replace fluids and minerals your body lost. It is sold at pharmacies and stores.  Drink plenty of fluids, such as: ? Water. ? Ice chips. ? Diluted fruit juice. ? Low-calorie sports drinks. ? Milk, if you want.  Avoid drinking fluids that have a lot of sugar or caffeine in them.  Eat bland, easy-to-digest foods in small amounts as you are able. These foods include: ? Bananas. ? Applesauce. ? Rice. ? Low-fat (lean) meats. ? Toast. ? Crackers.  Avoid  alcohol.  Avoid spicy or fatty foods.  Medicines  Take over-the-counter and prescription medicines only as told by your doctor.  If you were prescribed an antibiotic medicine, take it as told by your doctor. Do not stop using the antibiotic even if you start to feel better. General instructions   Wash your hands often using soap and water. If soap and water are not available, use a hand sanitizer. Others in your home should wash their hands as well. Hands should be washed: ? After using the toilet or changing a diaper. ? Before preparing, cooking, or serving food. ? While caring for a sick person. ? While visiting someone in a hospital.  Drink enough fluid to keep your pee (urine) pale yellow.  Rest at home while you get better.  Watch your condition for any changes.  Take a warm bath to help with any burning or pain from having diarrhea.  Keep all follow-up visits as told by your doctor. This is important. Contact a doctor if:  You have a fever.  Your diarrhea gets worse.  You have new symptoms.  You cannot keep fluids down.  You feel light-headed or dizzy.  You have a headache.  You have muscle cramps. Get help right away if:  You have chest pain.  You feel very weak or you pass out (faint).  You  have bloody or black poop or poop that looks like tar.  You have very bad pain, cramping, or bloating in your belly (abdomen).  You have trouble breathing or you are breathing very quickly.  Your heart is beating very quickly.  Your skin feels cold and clammy.  You feel confused.  You have signs of losing too much water in your body, such as: ? Dark pee, very little pee, or no pee. ? Cracked lips. ? Dry mouth. ? Sunken eyes. ? Sleepiness. ? Weakness. Summary  Diarrhea is when you pass loose and watery poop (stool) often.  Diarrhea can make you feel weak and cause you to lose water in your body (get dehydrated).  Take an ORS (oral rehydration  solution). This is a drink that is sold at pharmacies and stores.  Eat bland, easy-to-digest foods in small amounts as you are able.  Contact a doctor if your condition gets worse. Get help right away if you have signs that you have lost too much water in your body. This information is not intended to replace advice given to you by your health care provider. Make sure you discuss any questions you have with your health care provider. Document Revised: 12/26/2017 Document Reviewed: 12/26/2017 Elsevier Patient Education  [place].  Bloody Diarrhea Bloody diarrhea is frequent loose and watery bowel movements that contain blood. The blood can be hard to see or notice (occult). Bloody diarrhea may be caused by medical conditions such as:  Ulcerative colitis.  Crohn's disease.  Intestinal infection.  Viral gastroenteritis or bacterial gastroenteritis. Finding out why there is blood in your diarrhea is necessary so that your health care provider can prescribe the right treatment for you. Follow the instructions from your health care provider about treating the cause of your bloody diarrhea. Any type of diarrhea can make you feel weak and dehydrated. Dehydration can make you tired and thirsty, cause you to have a dry mouth, and decrease how often you urinate. Follow these instructions at home: Eating and drinking     Follow these recommendations as told by your health care provider:  Take an oral rehydration solution (ORS). This is an over-the-counter medicine that helps return your body to its normal balance of nutrients and water. It is found at pharmacies and retail stores.  Drink enough fluid to keep your urine pale yellow. ? Drink fluids such as water, ice chips, diluted fruit juice, and low-calorie sports drinks. You can also drink milk products, if desired. ? Avoid drinking fluids that contain a lot of sugar or caffeine, such as energy drinks, regular sports drinks, and  soda. ? Avoid alcohol.  Eat bland, easy-to-digest foods in small amounts as you are able. These foods include bananas, applesauce, rice, lean meats, toast, and crackers.  Avoid spicy or fatty foods.  Medicines  Take over-the-counter and prescription medicines only as told by your health care provider. ? Your health care provider may prescribe medicine to slow down the frequency of diarrhea or to ease stomach discomfort.  If you were prescribed an antibiotic medicine, take it as told by your health care provider. Do not stop using the antibiotic even if you start to feel better. General instructions   Wash your hands often using soap and water. If soap and water are not available, use a hand sanitizer. Others in the household should wash their hands as well. Hands should be washed: ? After using the toilet or changing a diaper. ? Before preparing, cooking, or  serving food. ? While caring for a sick person or while visiting someone in a hospital.  Rest at home while you recover.  Take a warm bath to relieve any burning or pain from frequent diarrhea episodes.  Watch your condition for any changes.  Keep all follow-up visits as told by your health care provider. This is important. Contact a health care provider if:  You have a fever.  Your diarrhea gets worse.  You have new symptoms.  You cannot keep fluids down.  You feel light-headed or dizzy.  You have a headache.  You have muscle cramps. Get help right away if:  You have chest pain.  You feel extremely weak or you faint.  The blood in your diarrhea increases or turns a different color.  You vomit and the vomit is bloody or looks black.  You have persistent diarrhea.  You have severe pain, cramping, or bloating in your abdomen.  You have trouble breathing or you are breathing very quickly.  Your heart is beating very quickly.  Your skin feels cold and clammy.  You feel confused.  You have signs of  dehydration, such as: ? Dark urine, very little urine, or no urine. ? Cracked lips. ? Dry mouth. ? Sunken eyes. ? Sleepiness. ? Weakness. Summary  Bloody diarrhea is frequent loose and watery bowel movements that contain blood. The blood can be hard to see or notice (occult).  Follow the instructions from your health care provider about treating the cause of your bloody diarrhea.  Any type of diarrhea can make you feel weak and dehydrated.  Follow your health care provider's recommendations for eating and drinking and for taking medicines.  Contact your health care provider if your symptoms get worse. Get help right away if you have signs of dehydration. This information is not intended to replace advice given to you by your health care provider. Make sure you discuss any questions you have with your health care provider. Document Revised: 01/01/2018 Document Reviewed: 01/01/2018 Elsevier Patient Education  [place].

## 2020-01-01 ENCOUNTER — Telehealth: Payer: Self-pay | Admitting: Nurse Practitioner

## 2020-01-01 ENCOUNTER — Other Ambulatory Visit
Admission: RE | Admit: 2020-01-01 | Discharge: 2020-01-01 | Disposition: A | Discharge status: Home / Self Care | Payer: PPO | Source: Ambulatory Visit | Attending: Nurse Practitioner | Admitting: Nurse Practitioner

## 2020-01-01 DIAGNOSIS — R197 Diarrhea, unspecified: Secondary | ICD-10-CM | POA: Insufficient documentation

## 2020-01-01 LAB — GASTROINTESTINAL PANEL BY PCR, STOOL (REPLACES STOOL CULTURE)

## 2020-01-01 LAB — C DIFFICILE QUICK SCREEN W PCR REFLEX
C Diff antigen: POSITIVE — AB
C Diff interpretation: DETECTED
C Diff toxin: POSITIVE — AB

## 2020-01-01 NOTE — Telephone Encounter (Signed)
I called and spoke with [name]:  Notified that the C difficile returns POSITIVE resulted now.  He has already started on the vancomycin 125 mg x2 doses at this time.  No diarrhea since mushy stool at 0900 today.  No more blood in the stool noted.   His prostate was enlarged and firm on his DRE. Recommend f/up with Dr. Caryl Bis next week to discuss need for Urology and GI follow-up.

## 2020-01-04 ENCOUNTER — Telehealth: Payer: Self-pay

## 2020-01-04 NOTE — Telephone Encounter (Signed)
-----   Message from Loel Dubonnet, NP sent at 01/04/2020  7:37 AM EDT ----- Heart monitor shows primarily normal sinus rhythm. Occasional short bursts of fast heart rates in the top chambers of the heart (SVT). And early beats in the top chambers of the heart. These are not dangerous. No evidence of malignant arrhythmia or pauses.   As his average heart rate is 63 bpm recommend treating conservatively. Recommend avoiding triggers of palpitations including stress, caffeine, alcohol, dehydration.

## 2020-01-04 NOTE — Telephone Encounter (Signed)
Call to patient to review zio results. spoke to son [name], okay per DPR   He verbalized understanding and has no further questions at this time.    Advised pt to call for any further questions or concerns.  No further orders.

## 2020-01-06 ENCOUNTER — Ambulatory Visit: Payer: PPO | Admitting: Gastroenterology

## 2020-01-06 ENCOUNTER — Encounter: Payer: Self-pay | Admitting: Gastroenterology

## 2020-01-06 ENCOUNTER — Inpatient Hospital Stay: Payer: PPO | Attending: Oncology

## 2020-01-06 ENCOUNTER — Other Ambulatory Visit: Payer: Self-pay

## 2020-01-06 VITALS — BP 131/61 | HR 62 | Temp 98.3°F | Ht 68.0 in | Wt 170.1 lb

## 2020-01-06 DIAGNOSIS — C9142 Hairy cell leukemia, in relapse: Secondary | ICD-10-CM

## 2020-01-06 DIAGNOSIS — D61818 Other pancytopenia: Secondary | ICD-10-CM

## 2020-01-06 DIAGNOSIS — A0472 Enterocolitis due to Clostridium difficile, not specified as recurrent: Secondary | ICD-10-CM

## 2020-01-06 DIAGNOSIS — K625 Hemorrhage of anus and rectum: Secondary | ICD-10-CM | POA: Diagnosis not present

## 2020-01-06 LAB — CBC WITH DIFFERENTIAL/PLATELET
Abs Immature Granulocytes: 0 10*3/uL (ref 0.00–0.07)
Basophils Absolute: 0 10*3/uL (ref 0.0–0.1)
Basophils Relative: 0 %
Eosinophils Absolute: 0.1 10*3/uL (ref 0.0–0.5)
Eosinophils Relative: 6 %
HCT: 25.8 % — ABNORMAL LOW (ref 39.0–52.0)
Hemoglobin: 8.7 g/dL — ABNORMAL LOW (ref 13.0–17.0)
Immature Granulocytes: 0 %
Lymphocytes Relative: 11 %
Lymphs Abs: 0.2 10*3/uL — ABNORMAL LOW (ref 0.7–4.0)
MCH: 34.3 pg — ABNORMAL HIGH (ref 26.0–34.0)
MCHC: 33.7 g/dL (ref 30.0–36.0)
MCV: 101.6 fL — ABNORMAL HIGH (ref 80.0–100.0)
Monocytes Absolute: 0.1 10*3/uL (ref 0.1–1.0)
Monocytes Relative: 5 %
Neutro Abs: 1.6 10*3/uL — ABNORMAL LOW (ref 1.7–7.7)
Neutrophils Relative %: 78 %
Platelets: 111 10*3/uL — ABNORMAL LOW (ref 150–400)
RBC: 2.54 MIL/uL — ABNORMAL LOW (ref 4.22–5.81)
RDW: 14.6 % (ref 11.5–15.5)
WBC: 2 10*3/uL — ABNORMAL LOW (ref 4.0–10.5)
nRBC: 0 % (ref 0.0–0.2)

## 2020-01-06 LAB — BASIC METABOLIC PANEL
Anion gap: 9 (ref 5–15)
BUN: 14 mg/dL (ref 8–23)
CO2: 24 mmol/L (ref 22–32)
Calcium: 8.8 mg/dL — ABNORMAL LOW (ref 8.9–10.3)
Chloride: 108 mmol/L (ref 98–111)
Creatinine, Ser: 1.34 mg/dL — ABNORMAL HIGH (ref 0.61–1.24)
GFR calc Af Amer: 56 mL/min — ABNORMAL LOW (ref >60)
GFR calc non Af Amer: 48 mL/min — ABNORMAL LOW (ref >60)
Glucose, Bld: 116 mg/dL — ABNORMAL HIGH (ref 70–99)
Potassium: 3.8 mmol/L (ref 3.5–5.1)
Sodium: 141 mmol/L (ref 135–145)

## 2020-01-06 NOTE — Progress Notes (Signed)
Frank Darby, MD [address]  [place]  [place], [place] [address]  Main: [phone]  Fax: [phone]    Gastroenterology Consultation  Referring Provider:     [name], MD Primary Care Physician:  Frank Haven, MD Primary Gastroenterologist:  Frank Mcdaniel Reason for Consultation:     Rectal bleeding, loose stools        HPI:   Frank Mcdaniel is a 86 y.o. male referred by Frank Mcdaniel, Frank Adam, MD  for consultation & management of rectal bleeding, loose stools.  Patient has history of hairy cell leukemia, previously on chemo, closely followed by Frank Mcdaniel, macrocytic anemia, pancytopenia.  Patient is accompanied by his son today.  Patient has been experiencing 3 weeks history of diarrhea, 5-6 times daily, loose bowel movements, not watery and mild rectal bleeding on wiping.  He underwent stool studies on 5/29 and found to have C. difficile infection, positive for antigen and toxin.  Therefore he started on oral vancomycin 125 mg 4 times a day for 10 days.  Patient reports that his increased bowel frequency has improved to 2-3 times daily.  His son continually wanted to discuss about rectal bleeding.  Patient denies any weight loss, abdominal pain, nausea or vomiting.  Is tolerating vancomycin well.  He denies any rectal pain or discomfort, perianal itching, prolapse.  His last colonoscopy was at least 10 years ago  He does not smoke or drink alcohol  NSAIDs: None  Antiplts/Anticoagulants/Anti thrombotics: None  GI Procedures: Colonoscopy at least 10 years ago Upper endoscopy by Dr. Allen Mcdaniel in 04/2017, found to have hiatal hernia, mild stricture of the GE junction, dilated to 37mm  - Benign-appearing esophageal stenosis. Dilated. - Normal stomach. - Normal examined duodenum. - No specimens collected.  Past Medical History:  Diagnosis Date  . Anemia 09/01/2015  . Anxiety 09/11/2015  . Arthritis   . Asthma    as a teenager  . Asymptomatic  Sinus Bradycardia   . Atypical chest pain    a. 08/2014  . BPH (benign prostatic hyperplasia)   . CKD (chronic kidney disease), stage III   . Closed fracture of tuft of distal phalanx of finger 08/31/2018  . Coronary artery disease    a. 2000 s/p CABG;  b. 10/2008 Neg MV, EF 64%; c. cath 05/2015: LM 85% sev cal, pLAD-1 lesion 80%, pLAD-2 lesion 100% chronic, ostLCx 70% sev cal, OM4 90%, ostRCA 70%, mid RCA 90%, LIMA-LAD patent, VG-OM2 patent, VG-OM3 patent, VG-RPDA patent. No AS  . Dementia without behavioral disturbance ([place]) 05/28/2019  . GERD (gastroesophageal reflux disease)   . H/O echocardiogram    a. 10/2005 Echo: nl EF.  [name] Hairy cell leukemia ([place])   . HOH (hard of hearing)    Bilateral hearing aids  . HTN (hypertension)   . Hyperlipidemia   . Hypertension   . Leukemia ([place])   . Stricture and stenosis of esophagus     Past Surgical History:  Procedure Laterality Date  . CARDIAC CATHETERIZATION N/A 05/26/2015   Procedure: Left Heart Cath;  Surgeon: Frank Merritts, MD;  Location: [facility];  Service: Cardiovascular;  Laterality: N/A;  . CARDIAC CATHETERIZATION N/A 05/26/2015   Procedure: Coronary/Graft Angiography;  Surgeon: Frank Merritts, MD;  Location: [facility];  Service: Cardiovascular;  Laterality: N/A;  . CATARACT EXTRACTION    . CORONARY ARTERY BYPASS GRAFT  2000  . ESOPHAGOGASTRODUODENOSCOPY N/A 02/18/2017   Procedure: ESOPHAGOGASTRODUODENOSCOPY (EGD)  with removal of food bolus;  Surgeon: Frank Lame, MD;  Location: [facility];  Service: Endoscopy;  Laterality: N/A;  . ESOPHAGOGASTRODUODENOSCOPY (EGD) WITH PROPOFOL N/A 03/11/2017   Procedure: ESOPHAGOGASTRODUODENOSCOPY (EGD) WITH PROPOFOL;  Surgeon: Frank Lame, MD;  Location: [facility];  Service: Endoscopy;  Laterality: N/A;  . ESOPHAGOGASTRODUODENOSCOPY (EGD) WITH PROPOFOL N/A 04/15/2017   Procedure: ESOPHAGOGASTRODUODENOSCOPY (EGD) WITH PROPOFOL;  Surgeon: Frank Lame, MD;  Location:  [facility] END[facility]COPY;  Service: Endoscopy;  Laterality: N/A;  . EYE SURGERY Left    Cataract Extraction with IOL  . INGUINAL HERNIA REPAIR Left 06/24/2016   Procedure: HERNIA REPAIR INGUINAL ADULT;  Surgeon: Frank Bellow, MD;  Location: [facility];  Service: General;  Laterality: Left;  . TONSILLECTOMY  1942   . UPPER GASTROINTESTINAL ENDOSCOPY  3 years ago    with Dilation.     Current Outpatient Medications:  .  aspirin EC 81 MG tablet, Take 81 mg by mouth daily., Disp: , Rfl:  .  atorvastatin (LIPITOR) 40 MG tablet, TAKE 1/2 TABLETS (20 MG TOTAL) BY MOUTH DAILY., Disp: 45 tablet, Rfl: 3 .  Calcium Carbonate Antacid (TUMS CHEWY BITES PO), Take 1 tablet by mouth as needed. , Disp: , Rfl:  .  CLINPRO 5000 1.1 % PSTE, APPLY PEASIZED AMOUNT, BRUSH DAILY AT LEAST 2 MINUTES. SPIT AFTER USE, Disp: , Rfl:  .  donepezil (ARICEPT) 10 MG tablet, Take 10 mg by mouth at bedtime., Disp: , Rfl:  .  isosorbide mononitrate (IMDUR) 30 MG 24 hr tablet, TAKE 1 TABLET BY MOUTH EVERY DAY, Disp: 90 tablet, Rfl: 3 .  Multiple Vitamin (MULTIVITAMIN) tablet, Take 1 tablet by mouth daily.  , Disp: , Rfl:  .  ondansetron (ZOFRAN) 8 MG tablet, Take 1 tablet (8 mg total) by mouth 2 (two) times daily as needed (Nausea or vomiting)., Disp: 30 tablet, Rfl: 1 .  prochlorperazine (COMPAZINE) 10 MG tablet, Take 1 tablet (10 mg total) by mouth every 6 (six) hours as needed (Nausea or vomiting)., Disp: 60 tablet, Rfl: 2 .  tamsulosin (FLOMAX) 0.4 MG CAPS capsule, TAKE 1 CAPSULES (0.4 MG TOTAL) BY MOUTH DAILY., Disp: 30 capsule, Rfl: 3 .  vancomycin (VANCOCIN) 125 MG capsule, Take 1 capsule (125 mg total) by mouth 4 (four) times daily for 10 days., Disp: 40 capsule, Rfl: 0    Family History  Problem Relation Age of Onset  . Hypertension Mother   . Arthritis Father   . Other Other        no premature CAD.  [name] Colon cancer Neg Hx   . Prostate cancer Neg Hx   . Stomach cancer Neg Hx      Social History   Tobacco Use  .  Smoking status: Never Smoker  . Smokeless tobacco: Never Used  Substance Use Topics  . Alcohol use: No  . Drug use: No    Allergies as of 01/06/2020  . (No Known Allergies)    Review of Systems:    All systems reviewed and negative except where noted in HPI.   Physical Exam:  BP 131/61 (BP Location: Left Arm, Patient Position: Sitting, Cuff Size: Normal)   Pulse 62   Temp 98.3 F (36.8 C) (Oral)   Ht 5\' 8"  (1.727 m)   Wt 170 lb 2 oz (77.2 kg)   BMI 25.87 kg/m  No LMP for male patient.  General:   Alert,  Well-developed, well-nourished, pleasant and cooperative in NAD Head:  Normocephalic and atraumatic. Eyes:  Sclera clear,  no icterus.   Conjunctiva pink. Ears:  Normal auditory acuity. Nose:  No deformity, discharge, or lesions. Mouth:  No deformity or lesions,oropharynx pink & moist. Neck:  Supple; no masses or thyromegaly. Lungs:  Respirations even and unlabored.  Clear throughout to auscultation.   No wheezes, crackles, or rhonchi. No acute distress. Heart:  Regular rate and rhythm; no murmurs, clicks, rubs, or gallops. Abdomen:  Normal bowel sounds. Soft, non-tender and non-distended without masses, hepatosplenomegaly or hernias noted.  No guarding or rebound tenderness.   Rectal: Skin tags, normal perianal skin, palpable external hemorrhoids, nontender Msk:  Symmetrical without gross deformities. Good, equal movement & strength bilaterally. Pulses:  Normal pulses noted. Extremities:  No clubbing or edema.  No cyanosis. Neurologic:  Alert and oriented x3;  grossly normal neurologically. Skin:  Intact without significant lesions or rashes. No jaundice. Psych:  Alert and cooperative. Normal mood and affect.  Imaging Studies: Reviewed  Assessment and Plan:   EITO OLSAVSKY is a 86 y.o. male with history of hairy cell leukemia, coronary disease s/p CABG, hypertension, hyperlipidemia, CKD, pancytopenia is seen in consultation for rectal bleeding and C. difficile  diarrhea  Rectal bleeding Most likely outlet bleeding from hemorrhoids If bleeding is persistent after treatment of C. difficile infection, we can consider colonoscopy or flexible sigmoidoscopy Advised patient's son to call my office back if bleeding and diarrhea are persistent after treatment of C. difficile infection  Pancytopenia No known cirrhosis Most recent LFTs are normal No evidence of splenomegaly based on ultrasound in 2014 Recommend abdominal ultrasound to evaluate for cirrhosis and splenomegaly   Follow up as needed   Frank Darby, MD

## 2020-01-07 ENCOUNTER — Encounter: Payer: Self-pay | Admitting: Family Medicine

## 2020-01-07 ENCOUNTER — Other Ambulatory Visit: Payer: Self-pay

## 2020-01-07 ENCOUNTER — Ambulatory Visit (INDEPENDENT_AMBULATORY_CARE_PROVIDER_SITE_OTHER): Payer: PPO | Admitting: Family Medicine

## 2020-01-07 VITALS — BP 120/70 | HR 67 | Temp 97.3°F | Ht 68.0 in | Wt 168.4 lb

## 2020-01-07 DIAGNOSIS — D61818 Other pancytopenia: Secondary | ICD-10-CM

## 2020-01-07 DIAGNOSIS — A0472 Enterocolitis due to Clostridium difficile, not specified as recurrent: Secondary | ICD-10-CM

## 2020-01-07 DIAGNOSIS — K625 Hemorrhage of anus and rectum: Secondary | ICD-10-CM

## 2020-01-07 DIAGNOSIS — F0390 Unspecified dementia without behavioral disturbance: Secondary | ICD-10-CM | POA: Diagnosis not present

## 2020-01-07 NOTE — Assessment & Plan Note (Signed)
Likely related to hairy cell leukemia recurrence though GI recommended having an ultrasound with Dopplers of his abdomen to evaluate for cirrhosis, portal hypertension, and splenomegaly.  This has been ordered.

## 2020-01-07 NOTE — Progress Notes (Signed)
[name], MD Phone: [phone]  Frank Mcdaniel is a 86 y.o. male who presents today for follow-up.  C. difficile diarrhea: Patient has been on vancomycin orally.  He notes this is improving quite a bit.  His only had one episode today.  He did have some blood in his stool and saw GI.  They recommended seeing how he does after treatment for the diarrhea if he has recurrence or persistent bleeding then consider a colonoscopy.  No fevers.  No abdominal pain.  Hyperlipidemia: Taking Lipitor.  No chest pain.  No right upper quadrant pain.  No myalgias.  Dementia: Notes this is stable.  Currently on Aricept.  He has been adequately managing his own medicine recently.  Social History   Tobacco Use  Smoking Status Never Smoker  Smokeless Tobacco Never Used     ROS see history of present illness  Objective  Physical Exam Vitals:   01/07/20 1350  BP: 120/70  Pulse: 67  Temp: (!) 97.3 F (36.3 C)  SpO2: 98%    BP Readings from Last 3 Encounters:  01/07/20 120/70  01/06/20 131/61  12/31/19 136/78   Wt Readings from Last 3 Encounters:  01/07/20 168 lb 6.4 oz (76.4 kg)  01/06/20 170 lb 2 oz (77.2 kg)  12/31/19 165 lb (74.8 kg)    Physical Exam Constitutional:      General: He is not in acute distress.    Appearance: He is not diaphoretic.  Cardiovascular:     Rate and Rhythm: Normal rate and regular rhythm.     Heart sounds: Normal heart sounds.  Pulmonary:     Effort: Pulmonary effort is normal.     Breath sounds: Normal breath sounds.  Abdominal:     General: Bowel sounds are normal. There is no distension.     Palpations: Abdomen is soft.     Tenderness: There is no abdominal tenderness. There is no guarding or rebound.  Skin:    General: Skin is warm and dry.  Neurological:     Mental Status: He is alert.      Assessment/Plan: Please see individual problem list.  C. difficile diarrhea Much improved.  Discussed completing his course of  antibiotics.  If he has any recurrence he will let us know.  BRBPR (bright red blood per rectum) They will monitor and if this is a persistent issue they will contact GI to schedule colonoscopy.  Pancytopenia ([place]) Likely related to hairy cell leukemia recurrence though GI recommended having an ultrasound with Dopplers of his abdomen to evaluate for cirrhosis, portal hypertension, and splenomegaly.  This has been ordered.  Dementia without behavioral disturbance ([facility]) Stable on Aricept.  He will continue this medication.   Orders Placed This Encounter  Procedures  . US ABDOMINAL PELVIC ART/VENT FLOW DOPPLER    Standing Status:   Future    Standing Expiration Date:   01/06/2021    Order Specific Question:   Reason for Exam (SYMPTOM  OR DIAGNOSIS REQUIRED)    Answer:   pancytopenia, rule out portal hypertension/cirrhosis/splenomegaly    Order Specific Question:   Preferred imaging location?    Answer:   [facility]  . US Abdomen Complete    Standing Status:   Future    Standing Expiration Date:   01/06/2021    Order Specific Question:   Reason for Exam (SYMPTOM  OR DIAGNOSIS REQUIRED)    Answer:   pancytopenia, rule out portal hypertension/cirrhosis/splenomegaly    Order Specific Question:  Preferred imaging location?    Answer:   [facility]    No orders of the defined types were placed in this encounter.   This visit occurred during the SARS-CoV-2 public health emergency.  Safety protocols were in place, including screening questions prior to the visit, additional usage of staff PPE, and extensive cleaning of exam room while observing appropriate contact time as indicated for disinfecting solutions.    [name], MD [place]

## 2020-01-07 NOTE — Assessment & Plan Note (Signed)
Much improved.  Discussed completing his course of antibiotics.  If he has any recurrence he will let us know.

## 2020-01-07 NOTE — Assessment & Plan Note (Signed)
Stable on Aricept.  He will continue this medication.

## 2020-01-07 NOTE — Addendum Note (Signed)
Addended by: [name] on: 01/07/2020 02:23 PM   Modules accepted: Orders

## 2020-01-07 NOTE — Assessment & Plan Note (Signed)
They will monitor and if this is a persistent issue they will contact GI to schedule colonoscopy.

## 2020-01-07 NOTE — Patient Instructions (Addendum)
Nice to see you. Some we will contact you to schedule the ultrasound. Please complete your full course of vancomycin.  If you have recurrence of your diarrhea please contact us. If you continue to have bleeding issues please contact GI to set up a colonoscopy.

## 2020-01-12 ENCOUNTER — Ambulatory Visit: Payer: PPO

## 2020-01-12 ENCOUNTER — Other Ambulatory Visit: Payer: Self-pay

## 2020-01-12 ENCOUNTER — Encounter: Payer: Self-pay | Admitting: Family Medicine

## 2020-01-12 ENCOUNTER — Ambulatory Visit
Admission: RE | Admit: 2020-01-12 | Discharge: 2020-01-12 | Disposition: A | Discharge status: Home / Self Care | Payer: PPO | Source: Ambulatory Visit | Attending: Family Medicine | Admitting: Family Medicine

## 2020-01-12 DIAGNOSIS — D61818 Other pancytopenia: Secondary | ICD-10-CM | POA: Diagnosis not present

## 2020-01-12 DIAGNOSIS — R197 Diarrhea, unspecified: Secondary | ICD-10-CM

## 2020-01-12 DIAGNOSIS — K8020 Calculus of gallbladder without cholecystitis without obstruction: Secondary | ICD-10-CM | POA: Diagnosis not present

## 2020-01-17 ENCOUNTER — Encounter: Payer: Self-pay | Admitting: Family Medicine

## 2020-01-17 DIAGNOSIS — R197 Diarrhea, unspecified: Secondary | ICD-10-CM

## 2020-01-18 DIAGNOSIS — C9142 Hairy cell leukemia, in relapse: Secondary | ICD-10-CM | POA: Diagnosis not present

## 2020-01-18 LAB — C DIFFICILE QUICK SCREEN W PCR REFLEX
C Diff antigen: POSITIVE — AB
C Diff toxin: NEGATIVE

## 2020-01-18 LAB — CLOSTRIDIUM DIFFICILE BY PCR, REFLEXED: Toxigenic C. Difficile by PCR: POSITIVE — AB

## 2020-01-18 MED ORDER — FIDAXOMICIN 200 MG PO TABS: 200.0000 mg | ORAL_TABLET | Freq: Two times a day (BID) | ORAL | 0 refills | Status: DC

## 2020-02-04 NOTE — Progress Notes (Signed)
Called patient for pre assessment. No concerns at this time.

## 2020-02-05 NOTE — Progress Notes (Signed)
Frank Mcdaniel  [phone] [phone]  ID: Frank Mcdaniel OB: 1933-02-08  MR#: [id number]  IPJ#:[id number]  Patient Care Team: [name], MD as PCP - General (Family Medicine) Rockey Situ Kathlene November, MD as PCP - Cardiology (Cardiology) [name], MD as Consulting Physician (Cardiology) [name], MD as Consulting Physician (Oncology) [name] as Physician Assistant (Urology)  CHIEF COMPLAINT: Recurrent hairy cell leukemia.  INTERVAL HISTORY: Patient returns to clinic today for repeat laboratory work and further evaluation.  He continues to have mild confusion, but otherwise feels well and is at his baseline.  He does not complain of any weakness or fatigue today.  He has no neurologic complaints.  He denies any fevers.  He denies any chest pain, shortness of breath, cough, or hemoptysis.  He denies nausea, vomiting, diarrhea or constipation.  He has no urinary complaints.  Patient offers no further specific complaints today.  REVIEW OF SYSTEMS:   Review of Systems  Constitutional: Negative.  Negative for fever, malaise/fatigue and weight loss.  Respiratory: Negative.  Negative for cough, hemoptysis and shortness of breath.   Cardiovascular: Negative.  Negative for chest pain and leg swelling.  Gastrointestinal: Negative.  Negative for abdominal pain.  Genitourinary: Negative.  Negative for dysuria.  Musculoskeletal: Negative.  Negative for back pain.  Skin: Negative.  Negative for rash.  Neurological: Negative.  Negative for dizziness, focal weakness, weakness and headaches.  Psychiatric/Behavioral: Positive for memory loss. The patient is not nervous/anxious.     As per HPI. Otherwise, a complete review of systems is negative.  PAST MEDICAL HISTORY: Past Medical History:  Diagnosis Date  . Anemia 09/01/2015  . Anxiety 09/11/2015  . Arthritis   . Asthma    as a teenager  . Asymptomatic Sinus Bradycardia    . Atypical chest pain    a. 08/2014  . BPH (benign prostatic hyperplasia)   . CKD (chronic kidney disease), stage III   . Closed fracture of tuft of distal phalanx of finger 08/31/2018  . Coronary artery disease    a. 2000 s/p CABG;  b. 10/2008 Neg MV, EF 64%; c. cath 05/2015: LM 85% sev cal, pLAD-1 lesion 80%, pLAD-2 lesion 100% chronic, ostLCx 70% sev cal, OM4 90%, ostRCA 70%, mid RCA 90%, LIMA-LAD patent, VG-OM2 patent, VG-OM3 patent, VG-RPDA patent. No AS  . Dementia without behavioral disturbance ([place]) 05/28/2019  . GERD (gastroesophageal reflux disease)   . H/O echocardiogram    a. 10/2005 Echo: nl EF.  [name] Hairy cell leukemia ([place])   . HOH (hard of hearing)    Bilateral hearing aids  . HTN (hypertension)   . Hyperlipidemia   . Hypertension   . Leukemia ([place])   . Stricture and stenosis of esophagus     PAST SURGICAL HISTORY: Past Surgical History:  Procedure Laterality Date  . CARDIAC CATHETERIZATION N/A 05/26/2015   Procedure: Left Heart Cath;  Surgeon: [name], MD;  Location: [facility];  Service: Cardiovascular;  Laterality: N/A;  . CARDIAC CATHETERIZATION N/A 05/26/2015   Procedure: Coronary/Graft Angiography;  Surgeon: [name], MD;  Location: [facility];  Service: Cardiovascular;  Laterality: N/A;  . CATARACT EXTRACTION    . CORONARY ARTERY BYPASS GRAFT  2000  . ESOPHAGOGASTRODUODENOSCOPY N/A 02/18/2017   Procedure: ESOPHAGOGASTRODUODENOSCOPY (EGD) with removal of food bolus;  Surgeon: [name], MD;  Location: [facility] ENDOSCOPY;  Service: Endoscopy;  Laterality: N/A;  . ESOPHAGOGASTRODUODENOSCOPY (EGD) WITH PROPOFOL N/A 03/11/2017  Procedure: ESOPHAGOGASTRODUODENOSCOPY (EGD) WITH PROPOFOL;  Surgeon: [name], MD;  Location: [facility] ENDOSCOPY;  Service: Endoscopy;  Laterality: N/A;  . ESOPHAGOGASTRODUODENOSCOPY (EGD) WITH PROPOFOL N/A 04/15/2017   Procedure: ESOPHAGOGASTRODUODENOSCOPY (EGD) WITH PROPOFOL;  Surgeon: [name], MD;   Location: [facility] ENDOSCOPY;  Service: Endoscopy;  Laterality: N/A;  . EYE SURGERY Left    Cataract Extraction with IOL  . INGUINAL HERNIA REPAIR Left 06/24/2016   Procedure: HERNIA REPAIR INGUINAL ADULT;  Surgeon: [name], MD;  Location: [facility] ORS;  Service: General;  Laterality: Left;  . TONSILLECTOMY  1942   . UPPER GASTROINTESTINAL ENDOSCOPY  3 years ago    with Dilation.     FAMILY HISTORY Family History  Problem Relation Age of Onset  . Hypertension Mother   . Arthritis Father   . Other Other        no premature CAD.  [name] Colon cancer Neg Hx   . Prostate cancer Neg Hx   . Stomach cancer Neg Hx        ADVANCED DIRECTIVES:    HEALTH MAINTENANCE: Social History   Tobacco Use  . Smoking status: Never Smoker  . Smokeless tobacco: Never Used  Vaping Use  . Vaping Use: Never used  Substance Use Topics  . Alcohol use: No  . Drug use: No     No Known Allergies  Current Outpatient Medications  Medication Sig Dispense Refill  . aspirin EC 81 MG tablet Take 81 mg by mouth daily.    [name] atorvastatin (LIPITOR) 40 MG tablet TAKE 1/2 TABLETS (20 MG TOTAL) BY MOUTH DAILY. 45 tablet 3  . Calcium Carbonate Antacid (TUMS CHEWY BITES PO) Take 1 tablet by mouth as needed.     [name] CLINPRO 5000 1.1 % PSTE APPLY PEASIZED AMOUNT, BRUSH DAILY AT LEAST 2 MINUTES. SPIT AFTER USE    . donepezil (ARICEPT) 10 MG tablet Take 10 mg by mouth at bedtime.    . isosorbide mononitrate (IMDUR) 30 MG 24 hr tablet TAKE 1 TABLET BY MOUTH EVERY DAY 90 tablet 3  . Multiple Vitamin (MULTIVITAMIN) tablet Take 1 tablet by mouth daily.      . ondansetron (ZOFRAN) 8 MG tablet Take 1 tablet (8 mg total) by mouth 2 (two) times daily as needed (Nausea or vomiting). 30 tablet 1  . prochlorperazine (COMPAZINE) 10 MG tablet Take 1 tablet (10 mg total) by mouth every 6 (six) hours as needed (Nausea or vomiting). 60 tablet 2  . tamsulosin (FLOMAX) 0.4 MG CAPS capsule TAKE 1 CAPSULES (0.4 MG TOTAL) BY MOUTH DAILY.  30 capsule 3   No current facility-administered medications for this visit.    OBJECTIVE: Vitals:   02/08/20 0929  BP: 129/72  Pulse: (!) 51  Resp: 20  Temp: 98.2 F (36.8 C)  SpO2: 100%     Body mass index is 25.33 kg/m.    ECOG FS:0 - Asymptomatic  General: Well-developed, well-nourished, no acute distress. Eyes: Pink conjunctiva, anicteric sclera. HEENT: Normocephalic, moist mucous membranes. Lungs: No audible wheezing or coughing. Heart: Regular rate and rhythm. Abdomen: Soft, nontender, no obvious distention. Musculoskeletal: No edema, cyanosis, or clubbing. Neuro: Alert, answering all questions appropriately. Cranial nerves grossly intact. Skin: No rashes or petechiae noted. Psych: Normal affect.    LAB RESULTS:  Lab Results  Component Value Date   NA 140 02/08/2020   K 4.1 02/08/2020   CL 107 02/08/2020   CO2 25 02/08/2020   GLUCOSE 94 02/08/2020   BUN 19 02/08/2020  CREATININE 1.46 (H) 02/08/2020   CALCIUM 8.8 (L) 02/08/2020   PROT 6.2 12/31/2019   ALBUMIN 4.2 12/31/2019   AST 20 12/31/2019   ALT 13 12/31/2019   ALKPHOS 67 12/31/2019   BILITOT 0.6 12/31/2019   GFRNONAA 43 (L) 02/08/2020   GFRAA 50 (L) 02/08/2020    Lab Results  Component Value Date   WBC 2.6 (L) 02/08/2020   NEUTROABS 2.0 02/08/2020   HGB 9.3 (L) 02/08/2020   HCT 28.1 (L) 02/08/2020   MCV 101.1 (H) 02/08/2020   PLT 120 (L) 02/08/2020     STUDIES: US Abdomen Complete  Result Date: 01/12/2020 CLINICAL DATA:  Pancytopenia. EXAM: ABDOMEN ULTRASOUND COMPLETE COMPARISON:  Liver duplex 01/12/2020 FINDINGS: Gallbladder: Echogenic gallstones. No evidence for gallbladder distention or wall thickening. No sonographic Murphy sign. Largest stone measures up to 2.1 cm. Common bile duct: Diameter: 0.5 cm Liver: No focal lesion identified. Within normal limits in parenchymal echogenicity. Portal vein is patent on color Doppler imaging with normal direction of blood flow towards the liver.  IVC: No abnormality visualized. Pancreas: Limited evaluation due to bowel gas. Spleen: Size and appearance within normal limits. Right Kidney: Length: 10.4 cm. Cortical thinning. Echogenicity is within normal limits. No hydronephrosis. No suspicious right renal lesion. Left Kidney: Length: 9.8 cm. Cortical thinning. Echogenicity is within normal limits. No hydronephrosis. No suspicious renal lesion. Mild lobulation of the left kidney. Abdominal aorta: No aneurysm visualized. Other findings: None. IMPRESSION: 1. Cholelithiasis. No evidence for acute cholecystitis. No biliary dilatation. 2. Cortical thinning in both kidneys without hydronephrosis. Electronically Signed   By: Markus Daft M.D.   On: 01/12/2020 13:17   US LIVER DOPPLER  Result Date: 01/12/2020 CLINICAL DATA:  Pancytopenia.  Evaluate for portal hypertension. EXAM: DUPLEX ULTRASOUND OF LIVER TECHNIQUE: Color and duplex Doppler ultrasound was performed to evaluate the hepatic in-flow and out-flow vessels. COMPARISON:  Abdominal ultrasound 01/12/2020 FINDINGS: Liver: Normal parenchymal echogenicity. Normal hepatic contour without nodularity. No focal lesion, mass or intrahepatic biliary ductal dilatation. Main Portal Vein size: 1.0 cm Portal Vein Velocities Main Prox:  49 cm/sec Main Mid: 59 cm/sec Main Dist:  37 cm/sec Right: 25 cm/sec Left: 29 cm/sec Hepatic Vein Velocities Right:  14 cm/sec Middle:  22 cm/sec Left:  19 cm/sec IVC: Present and patent with normal respiratory phasicity. Hepatic Artery Velocity:  125 cm/sec Splenic Vein Velocity:  10 cm/sec Spleen: 4.3 cm x 10.0 cm x 4.0 cm with a total volume of 86 cm^3 (411 cm^3 is upper limit normal) Portal Vein Occlusion/Thrombus: No Splenic Vein Occlusion/Thrombus: No Ascites: None Varices: None Normal hepatopetal flow in the portal veins. Normal hepatofugal flow in the hepatic veins. IMPRESSION: Normal liver duplex examination. Portal venous system is patent. No evidence for portal hypertension.  Electronically Signed   By: Markus Daft M.D.   On: 01/12/2020 12:39   ONCOLOGY HISTORY:  Patient initially received cladribine in 2006 and then was treated for a relapse in 2011 and again in 2014. Bone marrow biopsy at that time confirmed recurrence of disease with 80-90% hairy cell leukemia noted in his specimen. Patient completed cladribine 0.15 mg/kg on days 1 through 5 along with 8 weekly cycles of Rituxan on April 18, 2015. Bone marrow biopsy results after completion of 8 weeks of Rituxan without evidence of residual disease.  Patient typically has no evidence of disease and peripheral blood.   ASSESSMENT: Recurrent hairy cell leukemia.  PLAN:   1.  Recurrent hairy cell leukemia: See oncology history as above.  Bone marrow biopsy on November 02, 2019 revealed recurrent hairy cell leukemia, but percentage of involvement was not reported.  Patient received day 1 of t Rituxan and cladribine in April 2021, but then was admitted to the hospital.  His reason for admission does not appear related to treatment.  Although patient remains pancytopenic, his blood counts have trended up slightly and he is asymptomatic.  We will continue to hold treatment, but patient expressed understanding that he likely will have to reinitiate treatment in the near future.  Return to clinic in 3 months with repeat laboratory work and further evaluation.   2. Thrombocytopenia: Platelet count is trended up and is 120 today.  Would reconsider treatment if his platelets began to decrease and approached 50,000. 3.  Leukopenia: Mildly improved.  Patient's ANC is 2.0 today.  Would consider retreatment if patient's [place] fell persistently below 1.0.  4.  Anemia: Chronic and unchanged.  Hemoglobin has trended up slightly to 9.3. 5.  Cardiac disease: Continue follow-up and treatment per Dr. Rockey Situ.     Patient expressed understanding and was in agreement with this plan. He also understands that He can call clinic at any time with any  questions, concerns, or complaints.    [name], MD 02/08/20 10:36 AM

## 2020-02-08 ENCOUNTER — Other Ambulatory Visit: Payer: Self-pay

## 2020-02-08 ENCOUNTER — Inpatient Hospital Stay (HOSPITAL_BASED_OUTPATIENT_CLINIC_OR_DEPARTMENT_OTHER): Payer: PPO | Admitting: Oncology

## 2020-02-08 ENCOUNTER — Inpatient Hospital Stay: Payer: PPO | Attending: Oncology

## 2020-02-08 ENCOUNTER — Encounter: Payer: Self-pay | Admitting: Oncology

## 2020-02-08 VITALS — BP 129/72 | HR 51 | Temp 98.2°F | Resp 20 | Ht 68.0 in | Wt 166.6 lb

## 2020-02-08 DIAGNOSIS — E785 Hyperlipidemia, unspecified: Secondary | ICD-10-CM | POA: Insufficient documentation

## 2020-02-08 DIAGNOSIS — C9140 Hairy cell leukemia not having achieved remission: Secondary | ICD-10-CM | POA: Diagnosis not present

## 2020-02-08 DIAGNOSIS — C9142 Hairy cell leukemia, in relapse: Secondary | ICD-10-CM

## 2020-02-08 DIAGNOSIS — M1990 Unspecified osteoarthritis, unspecified site: Secondary | ICD-10-CM | POA: Diagnosis not present

## 2020-02-08 DIAGNOSIS — I129 Hypertensive chronic kidney disease with stage 1 through stage 4 chronic kidney disease, or unspecified chronic kidney disease: Secondary | ICD-10-CM | POA: Diagnosis not present

## 2020-02-08 DIAGNOSIS — R410 Disorientation, unspecified: Secondary | ICD-10-CM | POA: Diagnosis not present

## 2020-02-08 DIAGNOSIS — I2510 Atherosclerotic heart disease of native coronary artery without angina pectoris: Secondary | ICD-10-CM | POA: Insufficient documentation

## 2020-02-08 DIAGNOSIS — D61818 Other pancytopenia: Secondary | ICD-10-CM | POA: Insufficient documentation

## 2020-02-08 DIAGNOSIS — Z951 Presence of aortocoronary bypass graft: Secondary | ICD-10-CM | POA: Diagnosis not present

## 2020-02-08 DIAGNOSIS — Z7982 Long term (current) use of aspirin: Secondary | ICD-10-CM | POA: Diagnosis not present

## 2020-02-08 DIAGNOSIS — K219 Gastro-esophageal reflux disease without esophagitis: Secondary | ICD-10-CM | POA: Diagnosis not present

## 2020-02-08 DIAGNOSIS — Z79899 Other long term (current) drug therapy: Secondary | ICD-10-CM | POA: Diagnosis not present

## 2020-02-08 DIAGNOSIS — N183 Chronic kidney disease, stage 3 unspecified: Secondary | ICD-10-CM | POA: Diagnosis not present

## 2020-02-08 LAB — CBC WITH DIFFERENTIAL/PLATELET
Abs Immature Granulocytes: 0.01 10*3/uL (ref 0.00–0.07)
Basophils Absolute: 0 10*3/uL (ref 0.0–0.1)
Basophils Relative: 0 %
Eosinophils Absolute: 0.1 10*3/uL (ref 0.0–0.5)
Eosinophils Relative: 5 %
HCT: 28.1 % — ABNORMAL LOW (ref 39.0–52.0)
Hemoglobin: 9.3 g/dL — ABNORMAL LOW (ref 13.0–17.0)
Immature Granulocytes: 0 %
Lymphocytes Relative: 13 %
Lymphs Abs: 0.3 10*3/uL — ABNORMAL LOW (ref 0.7–4.0)
MCH: 33.5 pg (ref 26.0–34.0)
MCHC: 33.1 g/dL (ref 30.0–36.0)
MCV: 101.1 fL — ABNORMAL HIGH (ref 80.0–100.0)
Monocytes Absolute: 0.1 10*3/uL (ref 0.1–1.0)
Monocytes Relative: 5 %
Neutro Abs: 2 10*3/uL (ref 1.7–7.7)
Neutrophils Relative %: 77 %
Platelets: 120 10*3/uL — ABNORMAL LOW (ref 150–400)
RBC: 2.78 MIL/uL — ABNORMAL LOW (ref 4.22–5.81)
RDW: 15.2 % (ref 11.5–15.5)
WBC: 2.6 10*3/uL — ABNORMAL LOW (ref 4.0–10.5)
nRBC: 0 % (ref 0.0–0.2)

## 2020-02-08 LAB — BASIC METABOLIC PANEL
Anion gap: 8 (ref 5–15)
BUN: 19 mg/dL (ref 8–23)
CO2: 25 mmol/L (ref 22–32)
Calcium: 8.8 mg/dL — ABNORMAL LOW (ref 8.9–10.3)
Chloride: 107 mmol/L (ref 98–111)
Creatinine, Ser: 1.46 mg/dL — ABNORMAL HIGH (ref 0.61–1.24)
GFR calc Af Amer: 50 mL/min — ABNORMAL LOW (ref >60)
GFR calc non Af Amer: 43 mL/min — ABNORMAL LOW (ref >60)
Glucose, Bld: 94 mg/dL (ref 70–99)
Potassium: 4.1 mmol/L (ref 3.5–5.1)
Sodium: 140 mmol/L (ref 135–145)

## 2020-02-10 ENCOUNTER — Encounter: Payer: Self-pay | Admitting: Family Medicine

## 2020-02-10 DIAGNOSIS — R197 Diarrhea, unspecified: Secondary | ICD-10-CM

## 2020-02-11 ENCOUNTER — Other Ambulatory Visit
Admission: RE | Admit: 2020-02-11 | Discharge: 2020-02-11 | Disposition: A | Discharge status: Home / Self Care | Payer: PPO | Source: Other Acute Inpatient Hospital | Attending: Family Medicine | Admitting: Family Medicine

## 2020-02-11 DIAGNOSIS — R197 Diarrhea, unspecified: Secondary | ICD-10-CM | POA: Diagnosis not present

## 2020-02-11 LAB — C DIFFICILE QUICK SCREEN W PCR REFLEX
C Diff antigen: POSITIVE — AB
C Diff toxin: NEGATIVE

## 2020-02-11 LAB — CLOSTRIDIUM DIFFICILE BY PCR, REFLEXED: Toxigenic C. Difficile by PCR: POSITIVE — AB

## 2020-02-12 ENCOUNTER — Encounter: Payer: Self-pay | Admitting: Family Medicine

## 2020-02-17 ENCOUNTER — Telehealth: Payer: Self-pay | Admitting: Family Medicine

## 2020-02-17 NOTE — Telephone Encounter (Signed)
Caryl Pina stated she meant to route this to you.

## 2020-02-17 NOTE — Telephone Encounter (Signed)
Called and spoke with patients son. Scheduled an appointment for 02/28/20 at a 11:30.

## 2020-02-17 NOTE — Telephone Encounter (Signed)
Pt was returning call 

## 2020-02-17 NOTE — Telephone Encounter (Signed)
-----   Message from Perry Hall, [place] sent at 02/17/2020  9:40 AM EDT ----- Regarding: Need an appt This patient needs an appointment to discuss the following diagnosis:  - CKD, MODERATE (STAGE 3)(N18.3  Cancer Diagnosis with Bone Marrow Biopsy  BMI 30-40, AND COPD/FIBROSIS OF LUN  Please let me know when they have been scheduled.   Any questions give me a call [phone]

## 2020-02-17 NOTE — Telephone Encounter (Signed)
Pt needs follow up appt with Provider  Please see prev documentation from [facility]

## 2020-02-17 NOTE — Telephone Encounter (Signed)
Left message for patient to call back  

## 2020-02-25 ENCOUNTER — Ambulatory Visit: Payer: PPO | Admitting: Family Medicine

## 2020-02-28 ENCOUNTER — Other Ambulatory Visit: Payer: Self-pay

## 2020-02-28 ENCOUNTER — Encounter: Payer: Self-pay | Admitting: Family Medicine

## 2020-02-28 ENCOUNTER — Ambulatory Visit (INDEPENDENT_AMBULATORY_CARE_PROVIDER_SITE_OTHER): Payer: PPO | Admitting: Family Medicine

## 2020-02-28 VITALS — BP 115/70 | HR 75 | Temp 98.4°F | Ht 68.0 in | Wt 169.2 lb

## 2020-02-28 DIAGNOSIS — N1832 Chronic kidney disease, stage 3b: Secondary | ICD-10-CM | POA: Diagnosis not present

## 2020-02-28 DIAGNOSIS — N183 Chronic kidney disease, stage 3 unspecified: Secondary | ICD-10-CM | POA: Insufficient documentation

## 2020-02-28 DIAGNOSIS — R351 Nocturia: Secondary | ICD-10-CM

## 2020-02-28 DIAGNOSIS — N401 Enlarged prostate with lower urinary tract symptoms: Secondary | ICD-10-CM | POA: Diagnosis not present

## 2020-02-28 DIAGNOSIS — C9142 Hairy cell leukemia, in relapse: Secondary | ICD-10-CM | POA: Diagnosis not present

## 2020-02-28 DIAGNOSIS — A0472 Enterocolitis due to Clostridium difficile, not specified as recurrent: Secondary | ICD-10-CM

## 2020-02-28 MED ORDER — FINASTERIDE 5 MG PO TABS: 5.0000 mg | ORAL_TABLET | Freq: Every day | ORAL | 1 refills | Status: DC

## 2020-02-28 NOTE — Patient Instructions (Signed)
Nice to see you. We will try adding on Proscar for your prostate.  You will continue the Flomax.  We will see you back in about 3 months to see how this is helping. Please monitor for any liquidy stools.  If those occur please let us know.

## 2020-02-28 NOTE — Assessment & Plan Note (Signed)
He will continue to see oncology.  Treatment is on hold at this time related to pancytopenia.

## 2020-02-28 NOTE — Progress Notes (Signed)
[name], MD Phone: [phone]  Frank Mcdaniel is a 86 y.o. male who presents today for f/u.  C diff diarrhea: notes no diarrhea. He has been having 2-3 soft formed stools per day. Most recent testing with c diff though no toxin production. No abdominal pain or blood in his stool.   Hairy cell leukemia: Following with oncology.  They have been holding treatment recently due to pancytopenia.  They plan to recheck in 3 months.  CKD stage III: Prior ultrasound with cortical thinning of his kidneys.  No foamy urine.  No NSAID use.  He drinks plenty of fluids.  BPH: Currently on Flomax 1 tablet once daily.  He previously took 2 tablets of noticed no difference.  He gets up 4-5 times at night.  He notes good flow and no straining.  He does empty his bladder fully per his report.  Does note urgency at times.  Social History   Tobacco Use  Smoking Status Never Smoker  Smokeless Tobacco Never Used     ROS see history of present illness  Objective  Physical Exam Vitals:   02/28/20 1140  BP: 115/70  Pulse: 75  Temp: 98.4 F (36.9 C)  SpO2: 99%    BP Readings from Last 3 Encounters:  02/28/20 115/70  02/08/20 129/72  01/07/20 120/70   Wt Readings from Last 3 Encounters:  02/28/20 169 lb 3.2 oz (76.7 kg)  02/08/20 166 lb 9.6 oz (75.6 kg)  01/07/20 168 lb 6.4 oz (76.4 kg)    Physical Exam Constitutional:      General: He is not in acute distress.    Appearance: He is not diaphoretic.  Cardiovascular:     Rate and Rhythm: Normal rate and regular rhythm.     Heart sounds: Normal heart sounds.  Pulmonary:     Effort: Pulmonary effort is normal.     Breath sounds: Normal breath sounds.  Abdominal:     General: Bowel sounds are normal. There is no distension.     Palpations: Abdomen is soft.     Tenderness: There is no abdominal tenderness. There is no guarding or rebound.  Musculoskeletal:     Right lower leg: No edema.     Left lower leg: No edema.  Skin:     General: Skin is warm and dry.  Neurological:     Mental Status: He is alert.      Assessment/Plan: Please see individual problem list.  C. difficile diarrhea Patient is not having any diarrhea at this time.  I suspect he has colonization of C. difficile without toxin production.  Advised that they monitor and if he develops liquid stools they should let us know immediately.  Hairy cell leukemia He will continue to see oncology.  Treatment is on hold at this time related to pancytopenia.  CKD (chronic kidney disease), stage III Patient's creatinine has been relatively stable.  We will continue to monitor.  Discussed avoiding NSAIDs.  BPH (benign prostatic hyperplasia) Patient will continue Flomax.  We will trial Proscar.   No orders of the defined types were placed in this encounter.   Meds ordered this encounter  Medications  . finasteride (PROSCAR) 5 MG tablet    Sig: Take 1 tablet (5 mg total) by mouth daily.    Dispense:  30 tablet    Refill:  1    This visit occurred during the SARS-CoV-2 public health emergency.  Safety protocols were in place, including screening questions prior to the visit,  additional usage of staff PPE, and extensive cleaning of exam room while observing appropriate contact time as indicated for disinfecting solutions.    Clarance Bollard, MD [facility] [place]  

## 2020-02-28 NOTE — Assessment & Plan Note (Signed)
Patient's creatinine has been relatively stable.  We will continue to monitor.  Discussed avoiding NSAIDs.

## 2020-02-28 NOTE — Assessment & Plan Note (Signed)
Patient is not having any diarrhea at this time.  I suspect he has colonization of C. difficile without toxin production.  Advised that they monitor and if he develops liquid stools they should let us know immediately.

## 2020-02-28 NOTE — Assessment & Plan Note (Signed)
Patient will continue Flomax.  We will trial Proscar.

## 2020-03-21 ENCOUNTER — Other Ambulatory Visit: Payer: Self-pay | Admitting: Family Medicine

## 2020-03-29 ENCOUNTER — Ambulatory Visit: Payer: PPO | Admitting: Family Medicine

## 2020-04-13 ENCOUNTER — Other Ambulatory Visit: Payer: Self-pay | Admitting: Family Medicine

## 2020-05-04 NOTE — Progress Notes (Signed)
[place]  [phone] [phone]  ID: BHAVIN MONJARAZ OB: [birth year]  MR#: [id number]  XEN#:[id number]  Patient Care Team: [name], MD as PCP - General (Family Medicine) Rockey Situ Kathlene November, MD as PCP - Cardiology (Cardiology) [name], MD as Consulting Physician (Cardiology) [name], MD as Consulting Physician (Oncology) [name] as Physician Assistant (Urology)  CHIEF COMPLAINT: Recurrent hairy cell leukemia.  INTERVAL HISTORY: Patient returns to clinic today for repeat laboratory work and further evaluation.  He continues to have mild confusion.  His son reports increased weakness and fatigue recently.  He otherwise feels well.  He has no neurologic complaints.  He denies any recent fevers or illnesses.  He denies any chest pain, shortness of breath, cough, or hemoptysis.  He denies nausea, vomiting, diarrhea or constipation.  He has no urinary complaints.  Patient offers no further specific complaints today.  REVIEW OF SYSTEMS:   Review of Systems  Constitutional: Positive for malaise/fatigue. Negative for fever and weight loss.  Respiratory: Negative.  Negative for cough, hemoptysis and shortness of breath.   Cardiovascular: Negative.  Negative for chest pain and leg swelling.  Gastrointestinal: Negative.  Negative for abdominal pain.  Genitourinary: Negative.  Negative for dysuria.  Musculoskeletal: Negative.  Negative for back pain.  Skin: Negative.  Negative for rash.  Neurological: Positive for weakness. Negative for dizziness, focal weakness and headaches.  Psychiatric/Behavioral: Positive for memory loss. The patient is not nervous/anxious.     As per HPI. Otherwise, a complete review of systems is negative.  PAST MEDICAL HISTORY: Past Medical History:  Diagnosis Date  . Anemia 09/01/2015  . Anxiety 09/11/2015  . Arthritis   . Asthma    as a teenager  . Asymptomatic Sinus  Bradycardia   . Atypical chest pain    a. 08/2014  . BPH (benign prostatic hyperplasia)   . CKD (chronic kidney disease), stage III ([place])   . Closed fracture of tuft of distal phalanx of finger 08/31/2018  . Coronary artery disease    a. 2000 s/p CABG;  b. 10/2008 Neg MV, EF 64%; c. cath 05/2015: LM 85% sev cal, pLAD-1 lesion 80%, pLAD-2 lesion 100% chronic, ostLCx 70% sev cal, OM4 90%, ostRCA 70%, mid RCA 90%, LIMA-LAD patent, VG-OM2 patent, VG-OM3 patent, VG-RPDA patent. No AS  . Dementia without behavioral disturbance ([place]) 05/28/2019  . GERD (gastroesophageal reflux disease)   . H/O echocardiogram    a. 10/2005 Echo: nl EF.  [name] Hairy cell leukemia ([place])   . HOH (hard of hearing)    Bilateral hearing aids  . HTN (hypertension)   . Hyperlipidemia   . Hypertension   . Leukemia ([place])   . Neutropenic fever ([place]) 11/08/2019  . Stricture and stenosis of esophagus     PAST SURGICAL HISTORY: Past Surgical History:  Procedure Laterality Date  . CARDIAC CATHETERIZATION N/A 05/26/2015   Procedure: Left Heart Cath;  Surgeon: [name], MD;  Location: [facility];  Service: Cardiovascular;  Laterality: N/A;  . CARDIAC CATHETERIZATION N/A 05/26/2015   Procedure: Coronary/Graft Angiography;  Surgeon: [name], MD;  Location: [facility];  Service: Cardiovascular;  Laterality: N/A;  . CATARACT EXTRACTION    . CORONARY ARTERY BYPASS GRAFT  2000  . ESOPHAGOGASTRODUODENOSCOPY N/A 02/18/2017   Procedure: ESOPHAGOGASTRODUODENOSCOPY (EGD) with removal of food bolus;  Surgeon: [name], MD;  Location: [facility] END[facility]COPY;  Service: Endoscopy;  Laterality: N/A;  . ESOPHAGOGASTRODUODENOSCOPY (EGD)  WITH PROPOFOL N/A 03/11/2017   Procedure: ESOPHAGOGASTRODUODENOSCOPY (EGD) WITH PROPOFOL;  Surgeon: [name], MD;  Location: [facility] ENDOSCOPY;  Service: Endoscopy;  Laterality: N/A;  . ESOPHAGOGASTRODUODENOSCOPY (EGD) WITH PROPOFOL N/A 04/15/2017   Procedure: ESOPHAGOGASTRODUODENOSCOPY  (EGD) WITH PROPOFOL;  Surgeon: [name], MD;  Location: [facility] ENDOSCOPY;  Service: Endoscopy;  Laterality: N/A;  . EYE SURGERY Left    Cataract Extraction with IOL  . INGUINAL HERNIA REPAIR Left 06/24/2016   Procedure: HERNIA REPAIR INGUINAL ADULT;  Surgeon: [name], MD;  Location: [facility] ORS;  Service: General;  Laterality: Left;  . TONSILLECTOMY  1942   . UPPER GASTROINTESTINAL ENDOSCOPY  3 years ago    with Dilation.     FAMILY HISTORY Family History  Problem Relation Age of Onset  . Hypertension Mother   . Arthritis Father   . Other Other        no premature CAD.  [name] Colon cancer Neg Hx   . Prostate cancer Neg Hx   . Stomach cancer Neg Hx        ADVANCED DIRECTIVES:    HEALTH MAINTENANCE: Social History   Tobacco Use  . Smoking status: Never Smoker  . Smokeless tobacco: Never Used  Vaping Use  . Vaping Use: Never used  Substance Use Topics  . Alcohol use: No  . Drug use: No     No Known Allergies  Current Outpatient Medications  Medication Sig Dispense Refill  . aspirin EC 81 MG tablet Take 81 mg by mouth daily.    [name] atorvastatin (LIPITOR) 40 MG tablet TAKE 1/2 TABLETS (20 MG TOTAL) BY MOUTH DAILY. 45 tablet 3  . Calcium Carbonate Antacid (TUMS CHEWY BITES PO) Take 1 tablet by mouth as needed.     [name] CLINPRO 5000 1.1 % PSTE APPLY PEASIZED AMOUNT, BRUSH DAILY AT LEAST 2 MINUTES. SPIT AFTER USE    . donepezil (ARICEPT) 10 MG tablet Take 10 mg by mouth at bedtime.    . finasteride (PROSCAR) 5 MG tablet TAKE 1 TABLET BY MOUTH EVERY DAY 30 tablet 1  . isosorbide mononitrate (IMDUR) 30 MG 24 hr tablet TAKE 1 TABLET BY MOUTH EVERY DAY 90 tablet 3  . Multiple Vitamin (MULTIVITAMIN) tablet Take 1 tablet by mouth daily.      . ondansetron (ZOFRAN) 8 MG tablet Take 1 tablet (8 mg total) by mouth 2 (two) times daily as needed (Nausea or vomiting). 30 tablet 1  . prochlorperazine (COMPAZINE) 10 MG tablet Take 1 tablet (10 mg total) by mouth every 6 (six) hours as  needed (Nausea or vomiting). 60 tablet 2  . tamsulosin (FLOMAX) 0.4 MG CAPS capsule TAKE 1 CAPSULES (0.4 MG TOTAL) BY MOUTH DAILY. 30 capsule 3   No current facility-administered medications for this visit.    OBJECTIVE: Vitals:   05/11/20 1435  BP: (!) 125/48  Pulse: (!) 58  Resp: 20  Temp: 98.1 F (36.7 C)  SpO2: 99%     Body mass index is 25.53 kg/m.    ECOG FS:1 - Symptomatic but completely ambulatory  General: Well-developed, well-nourished, no acute distress. Eyes: Pink conjunctiva, anicteric sclera. HEENT: Normocephalic, moist mucous membranes. Lungs: No audible wheezing or coughing. Heart: Regular rate and rhythm. Abdomen: Soft, nontender, no obvious distention. Musculoskeletal: No edema, cyanosis, or clubbing. Neuro: Alert, answering all questions appropriately. Cranial nerves grossly intact. Skin: No rashes or petechiae noted. Psych: Normal affect.  LAB RESULTS:  Lab Results  Component Value Date   NA 139 05/11/2020  K 3.8 05/11/2020   CL 107 05/11/2020   CO2 24 05/11/2020   GLUCOSE 113 (H) 05/11/2020   BUN 20 05/11/2020   CREATININE 1.40 (H) 05/11/2020   CALCIUM 8.5 (L) 05/11/2020   PROT 6.2 12/31/2019   ALBUMIN 4.2 12/31/2019   AST 20 12/31/2019   ALT 13 12/31/2019   ALKPHOS 67 12/31/2019   BILITOT 0.6 12/31/2019   GFRNONAA 45 (L) 05/11/2020   GFRAA 50 (L) 02/08/2020    Lab Results  Component Value Date   WBC 1.9 (L) 05/11/2020   NEUTROABS 1.6 (L) 05/11/2020   HGB 7.2 (L) 05/11/2020   HCT 21.7 (L) 05/11/2020   MCV 102.4 (H) 05/11/2020   PLT 70 (L) 05/11/2020     STUDIES: No results found. ONCOLOGY HISTORY:  Patient initially received cladribine in 2006 and then was treated for a relapse in 2011 and again in 2014. Bone marrow biopsy at that time confirmed recurrence of disease with 80-90% hairy cell leukemia noted in his specimen. Patient completed cladribine 0.15 mg/kg on days 1 through 5 along with 8 weekly cycles of Rituxan on April 18, 2015. Bone marrow biopsy results after completion of 8 weeks of Rituxan without evidence of residual disease.  Patient typically has no evidence of disease and peripheral blood.   ASSESSMENT: Recurrent hairy cell leukemia.  PLAN:   1.  Recurrent hairy cell leukemia: See oncology history as above.  Bone marrow biopsy on November 02, 2019 revealed recurrent hairy cell leukemia, but percentage of involvement was not reported.  Patient received day 1 of Rituxan and cladribine in April 2021, but then was admitted to the hospital.  His reason for admission does not appear related to treatment.  Patient's pancytopenia is becoming worse likely indicating progression of disease.  Patient does not wish to reinitiate treatment at this time, but expressed understanding he likely will need to in the near future.  Return to clinic in 1 month with repeat laboratory work, further evaluation, and possible treatment planning.   2. Thrombocytopenia: Platelets have significantly trended down and are now 70,000.  Will consider retreatment as above. 3.  Leukopenia: Declining.  Patient's ANC is 1.6 today.  Consider retreatment as above.   4.  Anemia: Patient's hemoglobin is trended down to 7.2 and he is symptomatic.  Return to clinic in 1 month and will consider blood transfusion at that time. 5.  Cardiac disease: Continue follow-up and treatment per Dr. Rockey Situ.     Patient expressed understanding and was in agreement with this plan. He also understands that He can call clinic at any time with any questions, concerns, or complaints.    [name], MD 05/11/20 3:21 PM

## 2020-05-06 ENCOUNTER — Other Ambulatory Visit: Payer: Self-pay | Admitting: Family Medicine

## 2020-05-08 ENCOUNTER — Ambulatory Visit: Payer: PPO | Admitting: Family Medicine

## 2020-05-10 ENCOUNTER — Encounter: Payer: Self-pay | Admitting: Oncology

## 2020-05-10 NOTE — Progress Notes (Signed)
Spoke patient's son during pre assessment. He denies patient is having any pain or concerns at this time. He would like to discuss with provider Covid booster shot. He denies further questions at this time.

## 2020-05-11 ENCOUNTER — Encounter: Payer: Self-pay | Admitting: Oncology

## 2020-05-11 ENCOUNTER — Inpatient Hospital Stay: Payer: PPO | Attending: Oncology

## 2020-05-11 ENCOUNTER — Inpatient Hospital Stay: Payer: PPO | Admitting: Oncology

## 2020-05-11 ENCOUNTER — Other Ambulatory Visit: Payer: Self-pay

## 2020-05-11 VITALS — BP 125/48 | HR 58 | Temp 98.1°F | Resp 20 | Ht 68.0 in | Wt 167.9 lb

## 2020-05-11 DIAGNOSIS — R5381 Other malaise: Secondary | ICD-10-CM | POA: Insufficient documentation

## 2020-05-11 DIAGNOSIS — E785 Hyperlipidemia, unspecified: Secondary | ICD-10-CM | POA: Diagnosis not present

## 2020-05-11 DIAGNOSIS — R531 Weakness: Secondary | ICD-10-CM | POA: Diagnosis not present

## 2020-05-11 DIAGNOSIS — C9142 Hairy cell leukemia, in relapse: Secondary | ICD-10-CM | POA: Diagnosis not present

## 2020-05-11 DIAGNOSIS — Z79899 Other long term (current) drug therapy: Secondary | ICD-10-CM | POA: Diagnosis not present

## 2020-05-11 DIAGNOSIS — I129 Hypertensive chronic kidney disease with stage 1 through stage 4 chronic kidney disease, or unspecified chronic kidney disease: Secondary | ICD-10-CM | POA: Insufficient documentation

## 2020-05-11 DIAGNOSIS — N183 Chronic kidney disease, stage 3 unspecified: Secondary | ICD-10-CM | POA: Diagnosis not present

## 2020-05-11 DIAGNOSIS — F0390 Unspecified dementia without behavioral disturbance: Secondary | ICD-10-CM | POA: Diagnosis not present

## 2020-05-11 DIAGNOSIS — I2510 Atherosclerotic heart disease of native coronary artery without angina pectoris: Secondary | ICD-10-CM | POA: Insufficient documentation

## 2020-05-11 DIAGNOSIS — Z7982 Long term (current) use of aspirin: Secondary | ICD-10-CM | POA: Insufficient documentation

## 2020-05-11 DIAGNOSIS — R5383 Other fatigue: Secondary | ICD-10-CM | POA: Insufficient documentation

## 2020-05-11 DIAGNOSIS — K219 Gastro-esophageal reflux disease without esophagitis: Secondary | ICD-10-CM | POA: Insufficient documentation

## 2020-05-11 DIAGNOSIS — D696 Thrombocytopenia, unspecified: Secondary | ICD-10-CM | POA: Diagnosis not present

## 2020-05-11 LAB — CBC WITH DIFFERENTIAL/PLATELET
Abs Immature Granulocytes: 0.01 10*3/uL (ref 0.00–0.07)
Basophils Absolute: 0 10*3/uL (ref 0.0–0.1)
Basophils Relative: 0 %
Eosinophils Absolute: 0 10*3/uL (ref 0.0–0.5)
Eosinophils Relative: 2 %
HCT: 21.7 % — ABNORMAL LOW (ref 39.0–52.0)
Hemoglobin: 7.2 g/dL — ABNORMAL LOW (ref 13.0–17.0)
Immature Granulocytes: 1 %
Lymphocytes Relative: 13 %
Lymphs Abs: 0.3 10*3/uL — ABNORMAL LOW (ref 0.7–4.0)
MCH: 34 pg (ref 26.0–34.0)
MCHC: 33.2 g/dL (ref 30.0–36.0)
MCV: 102.4 fL — ABNORMAL HIGH (ref 80.0–100.0)
Monocytes Absolute: 0.1 10*3/uL (ref 0.1–1.0)
Monocytes Relative: 3 %
Neutro Abs: 1.6 10*3/uL — ABNORMAL LOW (ref 1.7–7.7)
Neutrophils Relative %: 81 %
Platelets: 70 10*3/uL — ABNORMAL LOW (ref 150–400)
RBC: 2.12 MIL/uL — ABNORMAL LOW (ref 4.22–5.81)
RDW: 16.5 % — ABNORMAL HIGH (ref 11.5–15.5)
WBC: 1.9 10*3/uL — ABNORMAL LOW (ref 4.0–10.5)
nRBC: 0 % (ref 0.0–0.2)

## 2020-05-11 LAB — BASIC METABOLIC PANEL
Anion gap: 8 (ref 5–15)
BUN: 20 mg/dL (ref 8–23)
CO2: 24 mmol/L (ref 22–32)
Calcium: 8.5 mg/dL — ABNORMAL LOW (ref 8.9–10.3)
Chloride: 107 mmol/L (ref 98–111)
Creatinine, Ser: 1.4 mg/dL — ABNORMAL HIGH (ref 0.61–1.24)
GFR calc non Af Amer: 45 mL/min — ABNORMAL LOW (ref >60)
Glucose, Bld: 113 mg/dL — ABNORMAL HIGH (ref 70–99)
Potassium: 3.8 mmol/L (ref 3.5–5.1)
Sodium: 139 mmol/L (ref 135–145)

## 2020-05-12 ENCOUNTER — Telehealth: Payer: Self-pay

## 2020-05-12 ENCOUNTER — Encounter: Payer: Self-pay | Admitting: Family Medicine

## 2020-05-12 NOTE — Telephone Encounter (Signed)
Called back and spoke to son regarding upcoming awv. Plans to keep via telephone call.

## 2020-05-29 ENCOUNTER — Encounter: Payer: Self-pay | Admitting: Family Medicine

## 2020-05-30 ENCOUNTER — Ambulatory Visit (INDEPENDENT_AMBULATORY_CARE_PROVIDER_SITE_OTHER): Payer: PPO

## 2020-05-30 VITALS — Ht 68.0 in | Wt 167.0 lb

## 2020-05-30 DIAGNOSIS — Z0000 Encounter for general adult medical examination without abnormal findings: Secondary | ICD-10-CM | POA: Diagnosis not present

## 2020-05-30 NOTE — Patient Instructions (Addendum)
Frank Mcdaniel , Thank you for taking time to come for your Medicare Wellness Visit. I appreciate your ongoing commitment to your health goals. Please review the following plan we discussed and let me know if I can assist you in the future.   These are the goals we discussed: Goals    . Follow up with Primary Care Provider     As needed and maintain good health       This is a list of the screening recommended for you and due dates:  Health Maintenance  Topic Date Due  . Flu Shot  03/05/2020  . Tetanus Vaccine  11/04/2027  . COVID-19 Vaccine  Completed  . Pneumonia vaccines  Completed    Immunizations Immunization History  Administered Date(s) Administered  . Fluad Quad(high Dose 65+) 05/04/2019  . Influenza, High Dose Seasonal PF 05/12/2017, 04/23/2018  . Influenza,inj,quad, With Preservative 05/06/2018  . Influenza-Unspecified 04/05/2016, 05/12/2017  . PFIZER SARS-COV-2 Vaccination 08/19/2019, 09/09/2019  . Pneumococcal Conjugate-13 11/03/2017  . Pneumococcal Polysaccharide-23 05/28/2019  . Tdap 11/03/2017  . Zoster Recombinat (Shingrix) 05/11/2019   Keep all routine maintenance appointments.   Follow up 06/07/20 @ 4:00  Advanced directives: completed, on file  Conditions/risks identified: none new  Follow up in one year for your annual wellness visit.   Preventive Care 84 Years and Older, Male Preventive care refers to lifestyle choices and visits with your health care provider that can promote health and wellness. What does preventive care include?  A yearly physical exam. This is also called an annual well check.  Dental exams once or twice a year.  Routine eye exams. Ask your health care provider how often you should have your eyes checked.  Personal lifestyle choices, including:  Daily care of your teeth and gums.  Regular physical activity.  Eating a healthy diet.  Avoiding tobacco and drug use.  Limiting alcohol use.  Practicing safe  sex.  Taking low doses of aspirin every day.  Taking vitamin and mineral supplements as recommended by your health care provider. What happens during an annual well check? The services and screenings done by your health care provider during your annual well check will depend on your age, overall health, lifestyle risk factors, and family history of disease. Counseling  Your health care provider may ask you questions about your:  Alcohol use.  Tobacco use.  Drug use.  Emotional well-being.  Home and relationship well-being.  Sexual activity.  Eating habits.  History of falls.  Memory and ability to understand (cognition).  Work and work Statistician. Screening  You may have the following tests or measurements:  Height, weight, and BMI.  Blood pressure.  Lipid and cholesterol levels. These may be checked every 5 years, or more frequently if you are over 72 years old.  Skin check.  Lung cancer screening. You may have this screening every year starting at age 37 if you have a 30-pack-year history of smoking and currently smoke or have quit within the past 15 years.  Fecal occult blood test (FOBT) of the stool. You may have this test every year starting at age 56.  Flexible sigmoidoscopy or colonoscopy. You may have a sigmoidoscopy every 5 years or a colonoscopy every 10 years starting at age 80.  Prostate cancer screening. Recommendations will vary depending on your family history and other risks.  Hepatitis C blood test.  Hepatitis B blood test.  Sexually transmitted disease (STD) testing.  Diabetes screening. This is done by checking your blood  sugar (glucose) after you have not eaten for a while (fasting). You may have this done every 1-3 years.  Abdominal aortic aneurysm (AAA) screening. You may need this if you are a current or former smoker.  Osteoporosis. You may be screened starting at age 64 if you are at high risk. Talk with your health care provider  about your test results, treatment options, and if necessary, the need for more tests. Vaccines  Your health care provider may recommend certain vaccines, such as:  Influenza vaccine. This is recommended every year.  Tetanus, diphtheria, and acellular pertussis (Tdap, Td) vaccine. You may need a Td booster every 10 years.  Zoster vaccine. You may need this after age 75.  Pneumococcal 13-valent conjugate (PCV13) vaccine. One dose is recommended after age 77.  Pneumococcal polysaccharide (PPSV23) vaccine. One dose is recommended after age 38. Talk to your health care provider about which screenings and vaccines you need and how often you need them. This information is not intended to replace advice given to you by your health care provider. Make sure you discuss any questions you have with your health care provider. Document Released: 08/18/2015 Document Revised: 04/10/2016 Document Reviewed: 05/23/2015 Elsevier Interactive Patient Education  2017 [place] Prevention in the Home Falls can cause injuries. They can happen to people of all ages. There are many things you can do to make your home safe and to help prevent falls. What can I do on the outside of my home?  Regularly fix the edges of walkways and driveways and fix any cracks.  Remove anything that might make you trip as you walk through a door, such as a raised step or threshold.  Trim any bushes or trees on the path to your home.  Use bright outdoor lighting.  Clear any walking paths of anything that might make someone trip, such as rocks or tools.  Regularly check to see if handrails are loose or broken. Make sure that both sides of any steps have handrails.  Any raised decks and porches should have guardrails on the edges.  Have any leaves, snow, or ice cleared regularly.  Use sand or salt on walking paths during winter.  Clean up any spills in your garage right away. This includes oil or grease  spills. What can I do in the bathroom?  Use night lights.  Install grab bars by the toilet and in the tub and shower. Do not use towel bars as grab bars.  Use non-skid mats or decals in the tub or shower.  If you need to sit down in the shower, use a plastic, non-slip stool.  Keep the floor dry. Clean up any water that spills on the floor as soon as it happens.  Remove soap buildup in the tub or shower regularly.  Attach bath mats securely with double-sided non-slip rug tape.  Do not have throw rugs and other things on the floor that can make you trip. What can I do in the bedroom?  Use night lights.  Make sure that you have a light by your bed that is easy to reach.  Do not use any sheets or blankets that are too big for your bed. They should not hang down onto the floor.  Have a firm chair that has side arms. You can use this for support while you get dressed.  Do not have throw rugs and other things on the floor that can make you trip. What can I do in the  kitchen?  Clean up any spills right away.  Avoid walking on wet floors.  Keep items that you use a lot in easy-to-reach places.  If you need to reach something above you, use a strong step stool that has a grab bar.  Keep electrical cords out of the way.  Do not use floor polish or wax that makes floors slippery. If you must use wax, use non-skid floor wax.  Do not have throw rugs and other things on the floor that can make you trip. What can I do with my stairs?  Do not leave any items on the stairs.  Make sure that there are handrails on both sides of the stairs and use them. Fix handrails that are broken or loose. Make sure that handrails are as long as the stairways.  Check any carpeting to make sure that it is firmly attached to the stairs. Fix any carpet that is loose or worn.  Avoid having throw rugs at the top or bottom of the stairs. If you do have throw rugs, attach them to the floor with carpet  tape.  Make sure that you have a light switch at the top of the stairs and the bottom of the stairs. If you do not have them, ask someone to add them for you. What else can I do to help prevent falls?  Wear shoes that:  Do not have high heels.  Have rubber bottoms.  Are comfortable and fit you well.  Are closed at the toe. Do not wear sandals.  If you use a stepladder:  Make sure that it is fully opened. Do not climb a closed stepladder.  Make sure that both sides of the stepladder are locked into place.  Ask someone to hold it for you, if possible.  Clearly mark and make sure that you can see:  Any grab bars or handrails.  First and last steps.  Where the edge of each step is.  Use tools that help you move around (mobility aids) if they are needed. These include:  Canes.  Walkers.  Scooters.  Crutches.  Turn on the lights when you go into a dark area. Replace any light bulbs as soon as they burn out.  Set up your furniture so you have a clear path. Avoid moving your furniture around.  If any of your floors are uneven, fix them.  If there are any pets around you, be aware of where they are.  Review your medicines with your doctor. Some medicines can make you feel dizzy. This can increase your chance of falling. Ask your doctor what other things that you can do to help prevent falls. This information is not intended to replace advice given to you by your health care provider. Make sure you discuss any questions you have with your health care provider. Document Released: 05/18/2009 Document Revised: 12/28/2015 Document Reviewed: 08/26/2014 Elsevier Interactive Patient Education  2017 [employer].

## 2020-05-30 NOTE — Progress Notes (Signed)
Subjective:   Frank Mcdaniel is a 86 y.o. male who presents for Medicare Annual/Subsequent preventive examination.  Review of Systems    No ROS.  Medicare Wellness Virtual Visit.    Cardiac Risk Factors include: advanced age (>38men, >65 women);hypertension;male gender     Objective:    Today's Vitals   05/30/20 1210  Weight: 167 lb (75.8 kg)  Height: 5\' 8"  (1.727 m)   Body mass index is 25.39 kg/m.  Advanced Directives 05/30/2020 05/10/2020 12/06/2019 11/15/2019 11/09/2019 11/08/2019 10/18/2019  Does Patient Have a Medical Advance Directive? Yes Yes Yes Yes Yes Yes Yes  Type of Paramedic of [place];Living will [place];Living will [place];Living will [place];Living will [place];Living will - [place];Living will  Does patient want to make changes to medical advance directive? No - Patient declined No - Patient declined No - Patient declined No - Patient declined No - Patient declined - No - Patient declined  Copy of [place] in Chart? - Yes - validated most recent copy scanned in chart (See row information) No - copy requested No - copy requested No - copy requested - No - copy requested  Would patient like information on creating a medical advance directive? - - - - - - -    Current Medications (verified) Outpatient Encounter Medications as of 05/30/2020  Medication Sig  . aspirin EC 81 MG tablet Take 81 mg by mouth daily.  [name] atorvastatin (LIPITOR) 40 MG tablet TAKE 1/2 TABLETS (20 MG TOTAL) BY MOUTH DAILY.  . Calcium Carbonate Antacid (TUMS CHEWY BITES PO) Take 1 tablet by mouth as needed.   [name] CLINPRO 5000 1.1 % PSTE APPLY PEASIZED AMOUNT, BRUSH DAILY AT LEAST 2 MINUTES. SPIT AFTER USE  . donepezil (ARICEPT) 10 MG tablet Take 10 mg by mouth at bedtime.  . finasteride (PROSCAR) 5 MG tablet TAKE 1 TABLET BY MOUTH EVERY DAY  .  isosorbide mononitrate (IMDUR) 30 MG 24 hr tablet TAKE 1 TABLET BY MOUTH EVERY DAY  . Multiple Vitamin (MULTIVITAMIN) tablet Take 1 tablet by mouth daily.    . ondansetron (ZOFRAN) 8 MG tablet Take 1 tablet (8 mg total) by mouth 2 (two) times daily as needed (Nausea or vomiting).  . prochlorperazine (COMPAZINE) 10 MG tablet Take 1 tablet (10 mg total) by mouth every 6 (six) hours as needed (Nausea or vomiting).  . tamsulosin (FLOMAX) 0.4 MG CAPS capsule TAKE 1 CAPSULES (0.4 MG TOTAL) BY MOUTH DAILY.   No facility-administered encounter medications on file as of 05/30/2020.    Allergies (verified) Patient has no known allergies.   History: Past Medical History:  Diagnosis Date  . Anemia 09/01/2015  . Anxiety 09/11/2015  . Arthritis   . Asthma    as a teenager  . Asymptomatic Sinus Bradycardia   . Atypical chest pain    a. 08/2014  . BPH (benign prostatic hyperplasia)   . CKD (chronic kidney disease), stage III ([place])   . Closed fracture of tuft of distal phalanx of finger 08/31/2018  . Coronary artery disease    a. 2000 s/p CABG;  b. 10/2008 Neg MV, EF 64%; c. cath 05/2015: LM 85% sev cal, pLAD-1 lesion 80%, pLAD-2 lesion 100% chronic, ostLCx 70% sev cal, OM4 90%, ostRCA 70%, mid RCA 90%, LIMA-LAD patent, VG-OM2 patent, VG-OM3 patent, VG-RPDA patent. No AS  . Dementia without behavioral disturbance ([place]) 05/28/2019  . GERD (  gastroesophageal reflux disease)   . H/O echocardiogram    a. 10/2005 Echo: nl EF.  [name] Hairy cell leukemia ([place])   . HOH (hard of hearing)    Bilateral hearing aids  . HTN (hypertension)   . Hyperlipidemia   . Hypertension   . Leukemia ([place])   . Neutropenic fever ([place]) 11/08/2019  . Stricture and stenosis of esophagus    Past Surgical History:  Procedure Laterality Date  . CARDIAC CATHETERIZATION N/A 05/26/2015   Procedure: Left Heart Cath;  Surgeon: [name], MD;  Location: [facility];  Service: Cardiovascular;  Laterality: N/A;  . CARDIAC  CATHETERIZATION N/A 05/26/2015   Procedure: Coronary/Graft Angiography;  Surgeon: [name], MD;  Location: [facility];  Service: Cardiovascular;  Laterality: N/A;  . CATARACT EXTRACTION    . CORONARY ARTERY BYPASS GRAFT  2000  . ESOPHAGOGASTRODUODENOSCOPY N/A 02/18/2017   Procedure: ESOPHAGOGASTRODUODENOSCOPY (EGD) with removal of food bolus;  Surgeon: [name], MD;  Location[facility] END[facility]COPY;  Service: Endoscopy;  Laterality: N/A;  . ESOPHAGOGASTRODUODENOSCOPY (EGD) WITH PROPOFOL N/A 03/11/2017   Procedure: ESOPHAGOGASTRODUODENOSCOPY (EGD) WITH PROPOFOL;  Surgeon: [name], MD;  Location: [facility] ENDOSCOPY;  Service: Endoscopy;  Laterality: N/A;  . ESOPHAGOGASTRODUODENOSCOPY (EGD) WITH PROPOFOL N/A 04/15/2017   Procedure: ESOPHAGOGASTRODUODENOSCOPY (EGD) WITH PROPOFOL;  Surgeon: [name], MD;  Location: [facility] ENDOSCOPY;  Service: Endoscopy;  Laterality: N/A;  . EYE SURGERY Left    Cataract Extraction with IOL  . INGUINAL HERNIA REPAIR Left 06/24/2016   Procedure: HERNIA REPAIR INGUINAL ADULT;  Surgeon: [name], MD;  Location: [facility] ORS;  Service: General;  Laterality: Left;  . TONSILLECTOMY  1942   . UPPER GASTROINTESTINAL ENDOSCOPY  3 years ago    with Dilation.    Family History  Problem Relation Age of Onset  . Hypertension Mother   . Arthritis Father   . Other Other        no premature CAD.  [name] Colon cancer Neg Hx   . Prostate cancer Neg Hx   . Stomach cancer Neg Hx    Social History   Socioeconomic History  . Marital status: Widowed    Spouse name: Not on file  . Number of children: 1  . Years of education: Not on file  . Highest education level: Not on file  Occupational History  . Occupation: retired  Tobacco Use  . Smoking status: Never Smoker  . Smokeless tobacco: Never Used  Vaping Use  . Vaping Use: Never used  Substance and Sexual Activity  . Alcohol use: No  . Drug use: No  . Sexual activity: Never  Other Topics Concern  . Not  on file  Social History Narrative   Lives locally with son.  Fairly active around the house though does not routinely exercise.  Retired from [employer].   Social Determinants of Health   Financial Resource Strain: Low Risk   . Difficulty of Paying Living Expenses: Not hard at all  Food Insecurity: No Food Insecurity  . Worried About Charity fundraiser in the Last Year: Never true  . Ran Out of Food in the Last Year: Never true  Transportation Needs: No Transportation Needs  . Lack of Transportation (Medical): No  . Lack of Transportation (Non-Medical): No  Physical Activity: Unknown  . Days of Exercise per Week: 0 days  . Minutes of Exercise per Session: Not on file  Stress: No Stress Concern Present  . Feeling of Stress :  Not at all  Social Connections:   . Frequency of Communication with Friends and Family: Not on file  . Frequency of Social Gatherings with Friends and Family: Not on file  . Attends Religious Services: Not on file  . Active Member of Clubs or Organizations: Not on file  . Attends Archivist Meetings: Not on file  . Marital Status: Not on file    Tobacco Counseling Counseling given: Not Answered   Clinical Intake:  Pre-visit preparation completed: Yes        Diabetes: No  How often do you need to have someone help you when you read instructions, pamphlets, or other written materials from your doctor or pharmacy?: 3 - Sometimes     Activities of Daily Living In your present state of health, do you have any difficulty performing the following activities: 05/30/2020 11/09/2019  Hearing? Y Y  Comment Hearing aid -  Vision? N N  Difficulty concentrating or making decisions? N N  Walking or climbing stairs? N Y  Dressing or bathing? N N  Doing errands, shopping? Y Y  Comment Son assist -  Conservation officer, nature and eating ? N -  Comment Microwave in use. Self feeds. -  Using the Toilet? N -  In the past six months, have you accidently leaked urine?  N -  Do you have problems with loss of bowel control? N -  Managing your Medications? N -  Comment Son assist as needed -  Managing your Finances? Y -  Comment Son assist -  Housekeeping or managing your Housekeeping? N -  Some recent data might be hidden    Patient Care Team: [name], MD as PCP - General (Family Medicine) Rockey Situ Kathlene November, MD as PCP - Cardiology (Cardiology) [name], MD as Consulting Physician (Cardiology) [name], MD as Consulting Physician (Oncology) Nori Riis, PA-C as Physician Assistant (Urology)  Indicate any recent Medical Services you may have received from other than [facility] providers in the past year (date may be approximate).     Assessment:   This is a routine wellness examination for Frank Mcdaniel.  I connected with Frank Mcdaniel today by telephone and verified that I am speaking with the correct person using two identifiers. Location patient: home Location provider: work Persons participating in the virtual visit: patient, son, Marine scientist.    I discussed the limitations, risks, security and privacy concerns of performing an evaluation and management service by telephone and the availability of in person appointments. The patient expressed understanding and verbally consented to this telephonic visit.    Interactive audio and video telecommunications were attempted between this provider and patient, however failed, due to patient having technical difficulties OR patient did not have access to video capability.  We continued and completed visit with audio only.  Some vital signs may be absent or patient reported.   Hearing/Vision screen  Hearing Screening   125Hz  250Hz  500Hz  1000Hz  2000Hz  3000Hz  4000Hz  6000Hz  8000Hz   Right ear:           Left ear:           Comments: Hearing aids  Vision Screening Comments: Cataract extraction, single  Wears corrective lenses  Visual acuity not assessed, virtual visit. They have seen  their ophthalmologist in the last 12 months.   Dietary issues and exercise activities discussed: Current Exercise Habits: Home exercise routine (Active mowing lawn), Intensity: Mild  Regular diet  Goals    . Follow up with Primary  Care Provider     As needed and maintain good health      Depression Screen PHQ 2/9 Scores 05/30/2020 01/07/2020 08/02/2019 05/28/2019 05/06/2018 05/05/2017 04/23/2016  PHQ - 2 Score 0 0 0 0 0 0 0  PHQ- 9 Score - - - - - 0 -    Fall Risk Fall Risk  05/30/2020 12/31/2019 08/02/2019 05/28/2019 05/06/2018  Falls in the past year? 0 0 0 0 No  Number falls in past yr: 0 - - - -  Follow up Falls evaluation completed Falls evaluation completed - - -   Handrails in use when climbing stairs? Yes Home free of loose throw rugs in walkways, pet beds, electrical cords, etc? Yes  Adequate lighting in your home to reduce risk of falls? Yes   ASSISTIVE DEVICES UTILIZED TO PREVENT FALLS: Use of a cane, walker or w/c? No   TIMED UP AND GO: Was the test performed? No . Virtual visit.   Cognitive Function: Uses calendar daily to assist Taking aricept as directed  MMSE - Mini Mental State Exam 05/28/2019 04/23/2016  Orientation to time 3 5  Orientation to Place 5 5  Registration 3 3  Attention/ Calculation 0 5  Recall 1 3  Language- name 2 objects 2 2  Language- repeat 1 1  Language- follow 3 step command 1 3  Language- read & follow direction 1 1  Write a sentence 1 1  Copy design 1 1  Total score 19 30     6CIT Screen 05/06/2018 05/05/2017  What Year? 0 points 0 points  What month? 0 points 0 points  What time? 0 points 0 points  Count back from 20 0 points 0 points  Months in reverse 0 points 0 points  Repeat phrase 0 points 2 points  Total Score 0 2    Immunizations Immunization History  Administered Date(s) Administered  . Fluad Quad(high Dose 65+) 05/04/2019  . Influenza, High Dose Seasonal PF 05/12/2017, 04/23/2018  . Influenza,inj,quad, With  Preservative 05/06/2018  . Influenza-Unspecified 04/05/2016, 05/12/2017  . PFIZER SARS-COV-2 Vaccination 08/19/2019, 09/09/2019  . Pneumococcal Conjugate-13 11/03/2017  . Pneumococcal Polysaccharide-23 05/28/2019  . Tdap 11/03/2017  . Zoster Recombinat (Shingrix) 05/11/2019   Health Maintenance Health Maintenance  Topic Date Due  . INFLUENZA VACCINE  03/05/2020  . TETANUS/TDAP  11/04/2027  . COVID-19 Vaccine  Completed  . PNA vac Low Risk Adult  Completed    Dental Screening: Recommended annual dental exams for proper oral hygiene  Community Resource Referral / Chronic Care Management: CRR required this visit?  No   CCM required this visit?  No      Plan:   Keep all routine maintenance appointments.   Follow up 06/07/20 @ 4:00. Receive influenza vaccine.  I have personally reviewed and noted the following in the patient's chart:   . Medical and social history . Use of alcohol, tobacco or illicit drugs  . Current medications and supplements . Functional ability and status . Nutritional status . Physical activity . Advanced directives . List of other physicians . Hospitalizations, surgeries, and ER visits in previous 12 months . Vitals . Screenings to include cognitive, depression, and falls . Referrals and appointments  In addition, I have reviewed and discussed with patient certain preventive protocols, quality metrics, and best practice recommendations. A written personalized care plan for preventive services as well as general preventive health recommendations were provided to patient via mychart.     Varney Biles, LPN   [phone]

## 2020-05-31 ENCOUNTER — Other Ambulatory Visit: Payer: Self-pay | Admitting: Family Medicine

## 2020-06-07 ENCOUNTER — Ambulatory Visit (INDEPENDENT_AMBULATORY_CARE_PROVIDER_SITE_OTHER): Payer: PPO | Admitting: Family Medicine

## 2020-06-07 ENCOUNTER — Telehealth: Payer: Self-pay | Admitting: Family Medicine

## 2020-06-07 ENCOUNTER — Encounter: Payer: Self-pay | Admitting: Family Medicine

## 2020-06-07 ENCOUNTER — Other Ambulatory Visit: Payer: Self-pay

## 2020-06-07 VITALS — BP 130/70 | HR 92 | Temp 98.3°F | Ht 68.0 in | Wt 171.4 lb

## 2020-06-07 DIAGNOSIS — I2510 Atherosclerotic heart disease of native coronary artery without angina pectoris: Secondary | ICD-10-CM | POA: Diagnosis not present

## 2020-06-07 DIAGNOSIS — R7309 Other abnormal glucose: Secondary | ICD-10-CM

## 2020-06-07 DIAGNOSIS — C9142 Hairy cell leukemia, in relapse: Secondary | ICD-10-CM

## 2020-06-07 DIAGNOSIS — N401 Enlarged prostate with lower urinary tract symptoms: Secondary | ICD-10-CM | POA: Diagnosis not present

## 2020-06-07 DIAGNOSIS — I10 Essential (primary) hypertension: Secondary | ICD-10-CM

## 2020-06-07 DIAGNOSIS — R351 Nocturia: Secondary | ICD-10-CM | POA: Diagnosis not present

## 2020-06-07 DIAGNOSIS — F0390 Unspecified dementia without behavioral disturbance: Secondary | ICD-10-CM

## 2020-06-07 MED ORDER — DONEPEZIL HCL 10 MG PO TABS: 10.0000 mg | ORAL_TABLET | Freq: Every day | ORAL | 1 refills | Status: DC

## 2020-06-07 NOTE — Patient Instructions (Signed)
Nice to see you. Please discontinue the Proscar once you finish your current prescription.  If you like to see a urologist in the future please let me know. I have sent Aricept in for you for your memory.

## 2020-06-07 NOTE — Assessment & Plan Note (Signed)
Proscar was not beneficial.  They will discontinue this once he completes his supply.  He will remain on Flomax 0.4 mg once daily.  We will check an A1c given elevated glucose to see if diabetes is contributing to his nocturia.

## 2020-06-07 NOTE — Assessment & Plan Note (Signed)
Adequate control.  We will continue to monitor in the office.  Continue Imdur 30 mg daily.

## 2020-06-07 NOTE — Assessment & Plan Note (Signed)
Asymptomatic.  Continue Imdur 30 mg daily.

## 2020-06-07 NOTE — Assessment & Plan Note (Signed)
He will keep his upcoming appointment with oncology.

## 2020-06-07 NOTE — Telephone Encounter (Signed)
Patient's son wanted to be called if anything changes with lab result [phone]

## 2020-06-07 NOTE — Progress Notes (Signed)
[name], MD Phone: [phone]  Frank Mcdaniel is a 86 y.o. male who presents today for follow-up.  Hypertension/CAD: Currently on Imdur.  No chest pain or shortness of breath.  Dementia: He is on Aricept.  He has been following with neurology though they wonder if I can refill the medication.  They note his short-term memory is not great though he has no ADL issues.  He does well if they keep to their routine.  BPH: No flow issues.  No straining.  Occasional urgency.  He does have nocturia 4-5 times nightly.  Notes his frequency is not as bad during the day.  He does empty his bladder.  Proscar was not beneficial.  He continues on Flomax.  Glucose was noted to be elevated recently.  Hairy cell leukemia: Patient follows up with oncology next week.  His son reports that may start treatment again.  Social History   Tobacco Use  Smoking Status Never Smoker  Smokeless Tobacco Never Used     ROS see history of present illness  Objective  Physical Exam Vitals:   06/07/20 1609  BP: 130/70  Pulse: 92  Temp: 98.3 F (36.8 C)  SpO2: 99%    BP Readings from Last 3 Encounters:  06/07/20 130/70  05/11/20 (!) 125/48  02/28/20 115/70   Wt Readings from Last 3 Encounters:  06/07/20 171 lb 6.4 oz (77.7 kg)  05/30/20 167 lb (75.8 kg)  05/11/20 167 lb 14.4 oz (76.2 kg)    Physical Exam Constitutional:      General: He is not in acute distress.    Appearance: He is not diaphoretic.  Cardiovascular:     Rate and Rhythm: Normal rate and regular rhythm.     Heart sounds: Normal heart sounds.  Pulmonary:     Effort: Pulmonary effort is normal.     Breath sounds: Normal breath sounds.  Musculoskeletal:     Right lower leg: No edema.     Left lower leg: No edema.  Skin:    General: Skin is warm and dry.  Neurological:     Mental Status: He is alert.      Assessment/Plan: Please see individual problem list.  Problem List Items Addressed This Visit    BPH  (benign prostatic hyperplasia) (Chronic)    Proscar was not beneficial.  They will discontinue this once he completes his supply.  He will remain on Flomax 0.4 mg once daily.  We will check an A1c given elevated glucose to see if diabetes is contributing to his nocturia.      Coronary artery disease (Chronic)    Asymptomatic.  Continue Imdur 30 mg daily.      Dementia without behavioral disturbance (H[facility])    Stable.  No ADL issues.  I will take over his Aricept prescription.  They will cancel follow-up with neurology at their request.      Relevant Medications   donepezil (ARICEPT) 10 MG tablet   Hairy cell leukemia (HCC) (Chronic)    He will keep his upcoming appointment with oncology.      HTN (hypertension) (Chronic)    Adequate control.  We will continue to monitor in the office.  Continue Imdur 30 mg daily.       Other Visit Diagnoses    Elevated glucose    -  Primary   Relevant Orders   HgB A1c      The patient's son asked if he can get a handicap placard for the patient.  The patient did not meet criteria for this.   This visit occurred during the SARS-CoV-2 public health emergency.  Safety protocols were in place, including screening questions prior to the visit, additional usage of staff PPE, and extensive cleaning of exam room while observing appropriate contact time as indicated for disinfecting solutions.    [name], MD [place]

## 2020-06-07 NOTE — Assessment & Plan Note (Signed)
Stable.  No ADL issues.  I will take over his Aricept prescription.  They will cancel follow-up with neurology at their request.

## 2020-06-08 LAB — HEMOGLOBIN A1C: Hgb A1c MFr Bld: 5.7 % (ref 4.6–6.5)

## 2020-06-08 NOTE — Telephone Encounter (Signed)
Did you see this in Nina's box?

## 2020-06-08 NOTE — Telephone Encounter (Signed)
Checked pt labs. There are no changes to report.

## 2020-06-11 NOTE — Progress Notes (Signed)
[place]  [phone] [phone]  ID: BLAYDE BACIGALUPI OB: [birth year]  MR#: [id number]  FXJ#:[id number]  Patient Care Team: [name], MD as PCP - General (Family Medicine) Rockey Situ Kathlene November, MD as PCP - Cardiology (Cardiology) [name], MD as Consulting Physician (Cardiology) [name], MD as Consulting Physician (Oncology) [name] as Physician Assistant (Urology)  CHIEF COMPLAINT: Recurrent hairy cell leukemia.  INTERVAL HISTORY: Patient returns to clinic today for repeat laboratory work and consideration of blood transfusion.  He continues to have chronic weakness and fatigue as well as confusion. He otherwise feels well.  He has no neurologic complaints.  He denies any recent fevers or illnesses.  He denies any chest pain, shortness of breath, cough, or hemoptysis.  He denies nausea, vomiting, diarrhea or constipation.  He has no urinary complaints.  Patient offers no further specific complaints today.  REVIEW OF SYSTEMS:   Review of Systems  Constitutional: Positive for malaise/fatigue. Negative for fever and weight loss.  Respiratory: Negative.  Negative for cough, hemoptysis and shortness of breath.   Cardiovascular: Negative.  Negative for chest pain and leg swelling.  Gastrointestinal: Negative.  Negative for abdominal pain.  Genitourinary: Negative.  Negative for dysuria.  Musculoskeletal: Negative.  Negative for back pain.  Skin: Negative.  Negative for rash.  Neurological: Positive for weakness. Negative for dizziness, focal weakness and headaches.  Psychiatric/Behavioral: Positive for memory loss. The patient is not nervous/anxious.     As per HPI. Otherwise, a complete review of systems is negative.  PAST MEDICAL HISTORY: Past Medical History:  Diagnosis Date  . Anemia 09/01/2015  . Anxiety 09/11/2015  . Arthritis   . Asthma    as a teenager  . Asymptomatic Sinus Bradycardia   .  Atypical chest pain    a. 08/2014  . BPH (benign prostatic hyperplasia)   . CKD (chronic kidney disease), stage III ([place])   . Closed fracture of tuft of distal phalanx of finger 08/31/2018  . Coronary artery disease    a. 2000 s/p CABG;  b. 10/2008 Neg MV, EF 64%; c. cath 05/2015: LM 85% sev cal, pLAD-1 lesion 80%, pLAD-2 lesion 100% chronic, ostLCx 70% sev cal, OM4 90%, ostRCA 70%, mid RCA 90%, LIMA-LAD patent, VG-OM2 patent, VG-OM3 patent, VG-RPDA patent. No AS  . Dementia without behavioral disturbance ([place]) 05/28/2019  . GERD (gastroesophageal reflux disease)   . H/O echocardiogram    a. 10/2005 Echo: nl EF.  [name] Hairy cell leukemia ([place])   . HOH (hard of hearing)    Bilateral hearing aids  . HTN (hypertension)   . Hyperlipidemia   . Hypertension   . Leukemia ([place])   . Neutropenic fever ([place]) 11/08/2019  . Stricture and stenosis of esophagus     PAST SURGICAL HISTORY: Past Surgical History:  Procedure Laterality Date  . CARDIAC CATHETERIZATION N/A 05/26/2015   Procedure: Left Heart Cath;  Surgeon: [name], MD;  Location: [place] [facility];  Service: Cardiovascular;  Laterality: N/A;  . CARDIAC CATHETERIZATION N/A 05/26/2015   Procedure: Coronary/Graft Angiography;  Surgeon: [name], MD;  Location: [place] [facility];  Service: Cardiovascular;  Laterality: N/A;  . CATARACT EXTRACTION    . CORONARY ARTERY BYPASS GRAFT  2000  . ESOPHAGOGASTRODUODENOSCOPY N/A 02/18/2017   Procedure: ESOPHAGOGASTRODUODENOSCOPY (EGD) with removal of food bolus;  Surgeon: [name], MD;  Location[facility] ENDOSCOPY;  Service: Endoscopy;  Laterality: N/A;  . ESOPHAGOGASTRODUODENOSCOPY (EGD) WITH PROPOFOL  N/A 03/11/2017   Procedure: ESOPHAGOGASTRODUODENOSCOPY (EGD) WITH PROPOFOL;  Surgeon: [name], MD;  Location: [facility] ENDOSCOPY;  Service: Endoscopy;  Laterality: N/A;  . ESOPHAGOGASTRODUODENOSCOPY (EGD) WITH PROPOFOL N/A 04/15/2017   Procedure: ESOPHAGOGASTRODUODENOSCOPY (EGD) WITH  PROPOFOL;  Surgeon: [name], MD;  Location: [facility] ENDOSCOPY;  Service: Endoscopy;  Laterality: N/A;  . EYE SURGERY Left    Cataract Extraction with IOL  . INGUINAL HERNIA REPAIR Left 06/24/2016   Procedure: HERNIA REPAIR INGUINAL ADULT;  Surgeon: [name], MD;  Location: [facility] ORS;  Service: General;  Laterality: Left;  . TONSILLECTOMY  1942   . UPPER GASTROINTESTINAL ENDOSCOPY  3 years ago    with Dilation.     FAMILY HISTORY Family History  Problem Relation Age of Onset  . Hypertension Mother   . Arthritis Father   . Other Other        no premature CAD.  [name] Colon cancer Neg Hx   . Prostate cancer Neg Hx   . Stomach cancer Neg Hx        ADVANCED DIRECTIVES:    HEALTH MAINTENANCE: Social History   Tobacco Use  . Smoking status: Never Smoker  . Smokeless tobacco: Never Used  Vaping Use  . Vaping Use: Never used  Substance Use Topics  . Alcohol use: No  . Drug use: No     No Known Allergies  Current Outpatient Medications  Medication Sig Dispense Refill  . aspirin EC 81 MG tablet Take 81 mg by mouth daily.    [name] atorvastatin (LIPITOR) 40 MG tablet TAKE 1/2 TABLETS (20 MG TOTAL) BY MOUTH DAILY. 45 tablet 3  . Calcium Carbonate Antacid (TUMS CHEWY BITES PO) Take 1 tablet by mouth as needed.     [name] CLINPRO 5000 1.1 % PSTE APPLY PEASIZED AMOUNT, BRUSH DAILY AT LEAST 2 MINUTES. SPIT AFTER USE    . donepezil (ARICEPT) 10 MG tablet Take 1 tablet (10 mg total) by mouth at bedtime. 90 tablet 1  . finasteride (PROSCAR) 5 MG tablet TAKE 1 TABLET BY MOUTH EVERY DAY 30 tablet 1  . FLUZONE HIGH-DOSE QUADRIVALENT 0.7 ML SUSY     . isosorbide mononitrate (IMDUR) 30 MG 24 hr tablet TAKE 1 TABLET BY MOUTH EVERY DAY 90 tablet 3  . Multiple Vitamin (MULTIVITAMIN) tablet Take 1 tablet by mouth daily.      . ondansetron (ZOFRAN) 8 MG tablet Take 1 tablet (8 mg total) by mouth 2 (two) times daily as needed (Nausea or vomiting). 30 tablet 1  . prochlorperazine (COMPAZINE) 10 MG  tablet Take 1 tablet (10 mg total) by mouth every 6 (six) hours as needed (Nausea or vomiting). 60 tablet 2  . tamsulosin (FLOMAX) 0.4 MG CAPS capsule TAKE 1 CAPSULES (0.4 MG TOTAL) BY MOUTH DAILY. 30 capsule 3   No current facility-administered medications for this visit.   Facility-Administered Medications Ordered in Other Visits  Medication Dose Route Frequency Provider Last Rate Last Admin  . 0.9 %  sodium chloride infusion (Manually program via Guardrails IV Fluids)  250 mL Intravenous Once [name], MD      . acetaminophen (TYLENOL) tablet 650 mg  650 mg Oral Once [name], MD      . diphenhydrAMINE (BENADRYL) injection 25 mg  25 mg Intravenous Once [name], MD        OBJECTIVE: Vitals:   06/12/20 0924  BP: (!) 120/54  Pulse: 68  Resp: 20  Temp: 97.8 F (36.6 C)  SpO2:  100%     Body mass index is 25.97 kg/m.    ECOG FS:1 - Symptomatic but completely ambulatory  General: Well-developed, well-nourished, no acute distress. Eyes: Pink conjunctiva, anicteric sclera. HEENT: Normocephalic, moist mucous membranes. Lungs: No audible wheezing or coughing. Heart: Regular rate and rhythm. Abdomen: Soft, nontender, no obvious distention. Musculoskeletal: No edema, cyanosis, or clubbing. Neuro: Alert, mildly confused.  Cranial nerves grossly intact. Skin: No rashes or petechiae noted. Psych: Normal affect.  LAB RESULTS:  Lab Results  Component Value Date   NA 139 06/12/2020   K 4.2 06/12/2020   CL 108 06/12/2020   CO2 24 06/12/2020   GLUCOSE 116 (H) 06/12/2020   BUN 23 06/12/2020   CREATININE 1.42 (H) 06/12/2020   CALCIUM 9.0 06/12/2020   PROT 6.2 12/31/2019   ALBUMIN 4.2 12/31/2019   AST 20 12/31/2019   ALT 13 12/31/2019   ALKPHOS 67 12/31/2019   BILITOT 0.6 12/31/2019   GFRNONAA 48 (L) 06/12/2020   GFRAA 50 (L) 02/08/2020    Lab Results  Component Value Date   WBC 1.1 (LL) 06/12/2020   NEUTROABS 0.8 (L) 06/12/2020   HGB 6.2 (L)  06/12/2020   HCT 19.6 (L) 06/12/2020   MCV 107.1 (H) 06/12/2020   PLT 60 (L) 06/12/2020     STUDIES: No results found.    ONCOLOGY HISTORY:  Patient initially received cladribine in 2006 and then was treated for a relapse in 2011 and again in 2014. Bone marrow biopsy at that time confirmed recurrence of disease with 80-90% hairy cell leukemia noted in his specimen. Patient completed cladribine 0.15 mg/kg on days 1 through 5 along with 8 weekly cycles of Rituxan on April 18, 2015. Bone marrow biopsy results after completion of 8 weeks of Rituxan without evidence of residual disease.  Patient typically has no evidence of disease and peripheral blood.   ASSESSMENT: Recurrent hairy cell leukemia.  PLAN:   1.  Recurrent hairy cell leukemia: See oncology history as above.  Bone marrow biopsy on November 02, 2019 revealed recurrent hairy cell leukemia, but percentage of involvement was not reported.  Patient received day 1 of Rituxan and cladribine in April 2021, but then was admitted to the hospital.  His reason for admission does not appear related to treatment.  Patient's pancytopenia continues to become worse secondary to progression of disease.  Both patient and son have agreed to reinitiate treatment with Rituxan and cladribine as above.  Plan is to wait until after Thanksgiving, therefore they will return on July 03, 2020 to initiate cycle 1, day 1 of treatment.   2. Thrombocytopenia: Platelets continue to trend down.  Reinitiate treatment as above. 3.  Leukopenia: Trending down.  Reinitiate treatment as above. 4.  Anemia: Hemoglobin has significantly declined and patient will receive 1 unit of packed red blood cells today.  Return to clinic at the end of next week for repeat laboratory work and consideration of blood the following day.  Patient will then follow-up as above for reinitiation of treatment. 5.  Cardiac disease: Continue follow-up and treatment per Dr. Rockey Situ.     Patient  expressed understanding and was in agreement with this plan. He also understands that He can call clinic at any time with any questions, concerns, or complaints.    [name], MD 06/12/20 12:42 PM

## 2020-06-12 ENCOUNTER — Inpatient Hospital Stay: Payer: PPO

## 2020-06-12 ENCOUNTER — Other Ambulatory Visit: Payer: Self-pay

## 2020-06-12 ENCOUNTER — Inpatient Hospital Stay: Payer: PPO | Admitting: Oncology

## 2020-06-12 ENCOUNTER — Encounter: Payer: Self-pay | Admitting: Oncology

## 2020-06-12 ENCOUNTER — Inpatient Hospital Stay: Payer: PPO | Attending: Oncology

## 2020-06-12 VITALS — BP 120/54 | HR 68 | Temp 97.8°F | Resp 20 | Wt 170.8 lb

## 2020-06-12 DIAGNOSIS — K219 Gastro-esophageal reflux disease without esophagitis: Secondary | ICD-10-CM | POA: Diagnosis not present

## 2020-06-12 DIAGNOSIS — R5383 Other fatigue: Secondary | ICD-10-CM | POA: Insufficient documentation

## 2020-06-12 DIAGNOSIS — C9142 Hairy cell leukemia, in relapse: Secondary | ICD-10-CM

## 2020-06-12 DIAGNOSIS — I129 Hypertensive chronic kidney disease with stage 1 through stage 4 chronic kidney disease, or unspecified chronic kidney disease: Secondary | ICD-10-CM | POA: Insufficient documentation

## 2020-06-12 DIAGNOSIS — R531 Weakness: Secondary | ICD-10-CM | POA: Diagnosis not present

## 2020-06-12 DIAGNOSIS — Z5112 Encounter for antineoplastic immunotherapy: Secondary | ICD-10-CM | POA: Diagnosis not present

## 2020-06-12 DIAGNOSIS — I2510 Atherosclerotic heart disease of native coronary artery without angina pectoris: Secondary | ICD-10-CM | POA: Diagnosis not present

## 2020-06-12 DIAGNOSIS — Z7982 Long term (current) use of aspirin: Secondary | ICD-10-CM | POA: Diagnosis not present

## 2020-06-12 DIAGNOSIS — Z79899 Other long term (current) drug therapy: Secondary | ICD-10-CM | POA: Diagnosis not present

## 2020-06-12 DIAGNOSIS — C9140 Hairy cell leukemia not having achieved remission: Secondary | ICD-10-CM | POA: Insufficient documentation

## 2020-06-12 DIAGNOSIS — R5381 Other malaise: Secondary | ICD-10-CM | POA: Insufficient documentation

## 2020-06-12 DIAGNOSIS — N183 Chronic kidney disease, stage 3 unspecified: Secondary | ICD-10-CM | POA: Diagnosis not present

## 2020-06-12 DIAGNOSIS — E785 Hyperlipidemia, unspecified: Secondary | ICD-10-CM | POA: Insufficient documentation

## 2020-06-12 DIAGNOSIS — Z5111 Encounter for antineoplastic chemotherapy: Secondary | ICD-10-CM | POA: Insufficient documentation

## 2020-06-12 LAB — BASIC METABOLIC PANEL
Anion gap: 7 (ref 5–15)
BUN: 23 mg/dL (ref 8–23)
CO2: 24 mmol/L (ref 22–32)
Calcium: 9 mg/dL (ref 8.9–10.3)
Chloride: 108 mmol/L (ref 98–111)
Creatinine, Ser: 1.42 mg/dL — ABNORMAL HIGH (ref 0.61–1.24)
GFR, Estimated: 48 mL/min — ABNORMAL LOW (ref >60)
Glucose, Bld: 116 mg/dL — ABNORMAL HIGH (ref 70–99)
Potassium: 4.2 mmol/L (ref 3.5–5.1)
Sodium: 139 mmol/L (ref 135–145)

## 2020-06-12 LAB — CBC WITH DIFFERENTIAL/PLATELET
Abs Immature Granulocytes: 0 10*3/uL (ref 0.00–0.07)
Basophils Absolute: 0 10*3/uL (ref 0.0–0.1)
Basophils Relative: 1 %
Eosinophils Absolute: 0 10*3/uL (ref 0.0–0.5)
Eosinophils Relative: 4 %
HCT: 19.6 % — ABNORMAL LOW (ref 39.0–52.0)
Hemoglobin: 6.2 g/dL — ABNORMAL LOW (ref 13.0–17.0)
Immature Granulocytes: 0 %
Lymphocytes Relative: 19 %
Lymphs Abs: 0.2 10*3/uL — ABNORMAL LOW (ref 0.7–4.0)
MCH: 33.9 pg (ref 26.0–34.0)
MCHC: 31.6 g/dL (ref 30.0–36.0)
MCV: 107.1 fL — ABNORMAL HIGH (ref 80.0–100.0)
Monocytes Absolute: 0.1 10*3/uL (ref 0.1–1.0)
Monocytes Relative: 6 %
Neutro Abs: 0.8 10*3/uL — ABNORMAL LOW (ref 1.7–7.7)
Neutrophils Relative %: 70 %
Platelets: 60 10*3/uL — ABNORMAL LOW (ref 150–400)
RBC: 1.83 MIL/uL — ABNORMAL LOW (ref 4.22–5.81)
RDW: 18.3 % — ABNORMAL HIGH (ref 11.5–15.5)
Smear Review: DECREASED
WBC: 1.1 10*3/uL — CL (ref 4.0–10.5)
nRBC: 0 % (ref 0.0–0.2)

## 2020-06-12 LAB — PREPARE RBC (CROSSMATCH)

## 2020-06-12 LAB — SAMPLE TO BLOOD BANK

## 2020-06-12 LAB — ABO/RH: ABO/RH(D): A POS

## 2020-06-12 MED ORDER — DIPHENHYDRAMINE HCL 50 MG/ML IJ SOLN
25.0000 mg | Freq: Once | INTRAMUSCULAR | Status: AC
  Administered 2020-06-12: 25 mg via INTRAVENOUS
  Filled 2020-06-12: qty 1

## 2020-06-12 MED ORDER — ACETAMINOPHEN 325 MG PO TABS
650.0000 mg | ORAL_TABLET | Freq: Once | ORAL | Status: AC
  Administered 2020-06-12: 650 mg via ORAL
  Filled 2020-06-12: qty 2

## 2020-06-12 MED ORDER — SODIUM CHLORIDE 0.9% IV SOLUTION
250.0000 mL | Freq: Once | INTRAVENOUS | Status: AC
  Administered 2020-06-12: 250 mL via INTRAVENOUS
  Filled 2020-06-12: qty 250

## 2020-06-12 NOTE — Progress Notes (Signed)
1500- Patient tolerated blood transfusion well. Patient discharged to home at this time.

## 2020-06-13 LAB — TYPE AND SCREEN
ABO/RH(D): A POS
Antibody Screen: NEGATIVE
Unit division: 0

## 2020-06-13 LAB — BPAM RBC
Blood Product Expiration Date: 202111122359
ISSUE DATE / TIME: 202111081258
Unit Type and Rh: 600

## 2020-06-22 ENCOUNTER — Other Ambulatory Visit: Payer: Self-pay | Admitting: Family Medicine

## 2020-06-22 ENCOUNTER — Other Ambulatory Visit: Payer: Self-pay

## 2020-06-22 ENCOUNTER — Other Ambulatory Visit: Payer: Self-pay | Admitting: *Deleted

## 2020-06-22 ENCOUNTER — Other Ambulatory Visit: Payer: Self-pay | Admitting: Oncology

## 2020-06-22 ENCOUNTER — Inpatient Hospital Stay: Payer: PPO

## 2020-06-22 DIAGNOSIS — C9142 Hairy cell leukemia, in relapse: Secondary | ICD-10-CM

## 2020-06-22 DIAGNOSIS — Z5111 Encounter for antineoplastic chemotherapy: Secondary | ICD-10-CM | POA: Diagnosis not present

## 2020-06-22 LAB — CBC WITH DIFFERENTIAL/PLATELET
Abs Immature Granulocytes: 0 10*3/uL (ref 0.00–0.07)
Basophils Absolute: 0 10*3/uL (ref 0.0–0.1)
Basophils Relative: 0 %
Eosinophils Absolute: 0 10*3/uL (ref 0.0–0.5)
Eosinophils Relative: 2 %
HCT: 21.9 % — ABNORMAL LOW (ref 39.0–52.0)
Hemoglobin: 7.2 g/dL — ABNORMAL LOW (ref 13.0–17.0)
Immature Granulocytes: 0 %
Lymphocytes Relative: 12 %
Lymphs Abs: 0.2 10*3/uL — ABNORMAL LOW (ref 0.7–4.0)
MCH: 34 pg (ref 26.0–34.0)
MCHC: 32.9 g/dL (ref 30.0–36.0)
MCV: 103.3 fL — ABNORMAL HIGH (ref 80.0–100.0)
Monocytes Absolute: 0.1 10*3/uL (ref 0.1–1.0)
Monocytes Relative: 4 %
Neutro Abs: 1.3 10*3/uL — ABNORMAL LOW (ref 1.7–7.7)
Neutrophils Relative %: 82 %
Platelets: 63 10*3/uL — ABNORMAL LOW (ref 150–400)
RBC: 2.12 MIL/uL — ABNORMAL LOW (ref 4.22–5.81)
RDW: 19.4 % — ABNORMAL HIGH (ref 11.5–15.5)
WBC: 1.6 10*3/uL — ABNORMAL LOW (ref 4.0–10.5)
nRBC: 0 % (ref 0.0–0.2)

## 2020-06-22 LAB — BASIC METABOLIC PANEL
Anion gap: 7 (ref 5–15)
BUN: 18 mg/dL (ref 8–23)
CO2: 26 mmol/L (ref 22–32)
Calcium: 8.8 mg/dL — ABNORMAL LOW (ref 8.9–10.3)
Chloride: 104 mmol/L (ref 98–111)
Creatinine, Ser: 1.47 mg/dL — ABNORMAL HIGH (ref 0.61–1.24)
GFR, Estimated: 46 mL/min — ABNORMAL LOW (ref >60)
Glucose, Bld: 132 mg/dL — ABNORMAL HIGH (ref 70–99)
Potassium: 3.8 mmol/L (ref 3.5–5.1)
Sodium: 137 mmol/L (ref 135–145)

## 2020-06-23 ENCOUNTER — Inpatient Hospital Stay: Payer: PPO

## 2020-06-23 DIAGNOSIS — C9142 Hairy cell leukemia, in relapse: Secondary | ICD-10-CM

## 2020-06-23 DIAGNOSIS — Z5111 Encounter for antineoplastic chemotherapy: Secondary | ICD-10-CM | POA: Diagnosis not present

## 2020-06-23 LAB — PREPARE RBC (CROSSMATCH)

## 2020-06-23 MED ORDER — ACETAMINOPHEN 325 MG PO TABS
650.0000 mg | ORAL_TABLET | Freq: Once | ORAL | Status: AC
  Administered 2020-06-23: 650 mg via ORAL
  Filled 2020-06-23: qty 2

## 2020-06-23 MED ORDER — DIPHENHYDRAMINE HCL 50 MG/ML IJ SOLN
25.0000 mg | Freq: Once | INTRAMUSCULAR | Status: AC
  Administered 2020-06-23: 25 mg via INTRAVENOUS
  Filled 2020-06-23: qty 1

## 2020-06-23 MED ORDER — SODIUM CHLORIDE 0.9% IV SOLUTION
250.0000 mL | Freq: Once | INTRAVENOUS | Status: AC
  Administered 2020-06-23: 250 mL via INTRAVENOUS
  Filled 2020-06-23: qty 250

## 2020-06-23 NOTE — Progress Notes (Signed)
Pt tolerated blood transfusion well with no complications. VSS. Pt stable for discharge. RN educated pt on the importance of notifying the clinic if any complications occur at home, pt verbalized understanding and all questions answered at this time. Pt escorted out to be driven home by son.   Taelyr Jantz [employer]

## 2020-06-24 LAB — TYPE AND SCREEN
ABO/RH(D): A POS
Antibody Screen: NEGATIVE
Unit division: 0

## 2020-06-24 LAB — BPAM RBC
Blood Product Expiration Date: 202112162359
ISSUE DATE / TIME: 202111191057
Unit Type and Rh: 6200

## 2020-06-28 NOTE — Progress Notes (Signed)
[place]  [phone] [phone]  ID: Frank Mcdaniel OB: October 31, 1932  MR#: [id number]  PFX#:[id number]  Patient Care Team: [name], MD as PCP - General (Family Medicine) Rockey Situ Kathlene November, MD as PCP - Cardiology (Cardiology) [name], MD as Consulting Physician (Cardiology) [name], MD as Consulting Physician (Oncology) [name] as Physician Assistant (Urology)  CHIEF COMPLAINT: Recurrent hairy cell leukemia.  INTERVAL HISTORY: Patient returns to clinic today for further evaluation and initiation of Rituxan and cladribine.  He continues to have chronic weakness and fatigue.  He also has increased confusion and much of the history is given by his son.  He otherwise feels well.  He has no neurologic complaints.  He denies any recent fevers or illnesses.  He denies any chest pain, shortness of breath, cough, or hemoptysis.  He denies nausea, vomiting, diarrhea or constipation.  He has no urinary complaints.  Patient offers no further specific complaints today.  REVIEW OF SYSTEMS:   Review of Systems  Constitutional: Positive for malaise/fatigue. Negative for fever and weight loss.  Respiratory: Negative.  Negative for cough, hemoptysis and shortness of breath.   Cardiovascular: Negative.  Negative for chest pain and leg swelling.  Gastrointestinal: Negative.  Negative for abdominal pain.  Genitourinary: Negative.  Negative for dysuria.  Musculoskeletal: Negative.  Negative for back pain.  Skin: Negative.  Negative for rash.  Neurological: Positive for weakness. Negative for dizziness, focal weakness and headaches.  Psychiatric/Behavioral: Positive for memory loss. The patient is not nervous/anxious.     As per HPI. Otherwise, a complete review of systems is negative.  PAST MEDICAL HISTORY: Past Medical History:  Diagnosis Date  . Anemia 09/01/2015  . Anxiety 09/11/2015  . Arthritis   . Asthma     as a teenager  . Asymptomatic Sinus Bradycardia   . Atypical chest pain    a. 08/2014  . BPH (benign prostatic hyperplasia)   . CKD (chronic kidney disease), stage III ([place])   . Closed fracture of tuft of distal phalanx of finger 08/31/2018  . Coronary artery disease    a. 2000 s/p CABG;  b. 10/2008 Neg MV, EF 64%; c. cath 05/2015: LM 85% sev cal, pLAD-1 lesion 80%, pLAD-2 lesion 100% chronic, ostLCx 70% sev cal, OM4 90%, ostRCA 70%, mid RCA 90%, LIMA-LAD patent, VG-OM2 patent, VG-OM3 patent, VG-RPDA patent. No AS  . Dementia without behavioral disturbance ([place]) 05/28/2019  . GERD (gastroesophageal reflux disease)   . H/O echocardiogram    a. 10/2005 Echo: nl EF.  [name] Hairy cell leukemia ([place])   . HOH (hard of hearing)    Bilateral hearing aids  . HTN (hypertension)   . Hyperlipidemia   . Hypertension   . Leukemia ([place])   . Neutropenic fever ([place]) 11/08/2019  . Stricture and stenosis of esophagus     PAST SURGICAL HISTORY: Past Surgical History:  Procedure Laterality Date  . CARDIAC CATHETERIZATION N/A 05/26/2015   Procedure: Left Heart Cath;  Surgeon: [name], MD;  Location: [place] [facility];  Service: Cardiovascular;  Laterality: N/A;  . CARDIAC CATHETERIZATION N/A 05/26/2015   Procedure: Coronary/Graft Angiography;  Surgeon: [name], MD;  Location: [place] [facility];  Service: Cardiovascular;  Laterality: N/A;  . CATARACT EXTRACTION    . CORONARY ARTERY BYPASS GRAFT  2000  . ESOPHAGOGASTRODUODENOSCOPY N/A 02/18/2017   Procedure: ESOPHAGOGASTRODUODENOSCOPY (EGD) with removal of food bolus;  Surgeon: [name], MD;  Location[facility]  ENDOSCOPY;  Service: Endoscopy;  Laterality: N/A;  . ESOPHAGOGASTRODUODENOSCOPY (EGD) WITH PROPOFOL N/A 03/11/2017   Procedure: ESOPHAGOGASTRODUODENOSCOPY (EGD) WITH PROPOFOL;  Surgeon: [name], MD;  Location: [facility] ENDOSCOPY;  Service: Endoscopy;  Laterality: N/A;  . ESOPHAGOGASTRODUODENOSCOPY (EGD) WITH PROPOFOL N/A 04/15/2017    Procedure: ESOPHAGOGASTRODUODENOSCOPY (EGD) WITH PROPOFOL;  Surgeon: [name], MD;  Location: [facility] ENDOSCOPY;  Service: Endoscopy;  Laterality: N/A;  . EYE SURGERY Left    Cataract Extraction with IOL  . INGUINAL HERNIA REPAIR Left 06/24/2016   Procedure: HERNIA REPAIR INGUINAL ADULT;  Surgeon: [name], MD;  Location: [facility] ORS;  Service: General;  Laterality: Left;  . TONSILLECTOMY  1942   . UPPER GASTROINTESTINAL ENDOSCOPY  3 years ago    with Dilation.     FAMILY HISTORY Family History  Problem Relation Age of Onset  . Hypertension Mother   . Arthritis Father   . Other Other        no premature CAD.  [name] Colon cancer Neg Hx   . Prostate cancer Neg Hx   . Stomach cancer Neg Hx        ADVANCED DIRECTIVES:    HEALTH MAINTENANCE: Social History   Tobacco Use  . Smoking status: Never Smoker  . Smokeless tobacco: Never Used  Vaping Use  . Vaping Use: Never used  Substance Use Topics  . Alcohol use: No  . Drug use: No     No Known Allergies  Current Outpatient Medications  Medication Sig Dispense Refill  . aspirin EC 81 MG tablet Take 81 mg by mouth daily.    [name] atorvastatin (LIPITOR) 40 MG tablet TAKE 1/2 TABLETS (20 MG TOTAL) BY MOUTH DAILY. 45 tablet 3  . Calcium Carbonate Antacid (TUMS CHEWY BITES PO) Take 1 tablet by mouth as needed.     [name] CLINPRO 5000 1.1 % PSTE APPLY PEASIZED AMOUNT, BRUSH DAILY AT LEAST 2 MINUTES. SPIT AFTER USE    . donepezil (ARICEPT) 10 MG tablet Take 1 tablet (10 mg total) by mouth at bedtime. 90 tablet 1  . finasteride (PROSCAR) 5 MG tablet TAKE 1 TABLET BY MOUTH EVERY DAY 30 tablet 1  . FLUZONE HIGH-DOSE QUADRIVALENT 0.7 ML SUSY     . isosorbide mononitrate (IMDUR) 30 MG 24 hr tablet TAKE 1 TABLET BY MOUTH EVERY DAY 90 tablet 3  . Multiple Vitamin (MULTIVITAMIN) tablet Take 1 tablet by mouth daily.      . ondansetron (ZOFRAN) 8 MG tablet Take 1 tablet (8 mg total) by mouth 2 (two) times daily as needed (Nausea or vomiting).  30 tablet 1  . prochlorperazine (COMPAZINE) 10 MG tablet Take 1 tablet (10 mg total) by mouth every 6 (six) hours as needed (Nausea or vomiting). 60 tablet 2  . tamsulosin (FLOMAX) 0.4 MG CAPS capsule TAKE 1 CAPSULES (0.4 MG TOTAL) BY MOUTH DAILY. 30 capsule 3   No current facility-administered medications for this visit.   Facility-Administered Medications Ordered in Other Visits  Medication Dose Route Frequency Provider Last Rate Last Admin  . acetaminophen (TYLENOL) tablet 650 mg  650 mg Oral Once [name], MD      . cladribine (LEUSTATIN) 8 mg in sodium chloride 0.9 % 250 mL chemo infusion  0.11 mg/kg (Treatment Plan Recorded) Intravenous Once [name], MD 129 mL/hr at 07/03/20 1026 8 mg at 07/03/20 1026  . dexamethasone (DECADRON) 10 mg in sodium chloride 0.9 % 50 mL IVPB  10 mg Intravenous Once [name], MD      .  diphenhydrAMINE (BENADRYL) capsule 25 mg  25 mg Oral Once [name], MD      . famotidine (PEPCID) IVPB 20 mg premix  20 mg Intravenous Once [name], MD      . riTUXimab-pvvr (RUXIENCE) 700 mg in sodium chloride 0.9 % 250 mL (2.1875 mg/mL) infusion  375 mg/m2 (Treatment Plan Recorded) Intravenous Once [name], MD        OBJECTIVE: Vitals:   07/03/20 0851  BP: (!) 128/55  Pulse: (!) 58  Temp: 97.7 F (36.5 C)  SpO2: 100%     Body mass index is 25.42 kg/m.    ECOG FS:1 - Symptomatic but completely ambulatory  General: Well-developed, well-nourished, no acute distress. Eyes: Pink conjunctiva, anicteric sclera. HEENT: Normocephalic, moist mucous membranes. Lungs: No audible wheezing or coughing. Heart: Regular rate and rhythm. Abdomen: Soft, nontender, no obvious distention. Musculoskeletal: No edema, cyanosis, or clubbing. Neuro: Alert, answering all questions appropriately. Cranial nerves grossly intact. Skin: No rashes or petechiae noted. Psych: Normal affect.  LAB RESULTS:  Lab Results   Component Value Date   NA 140 07/03/2020   K 3.6 07/03/2020   CL 107 07/03/2020   CO2 23 07/03/2020   GLUCOSE 97 07/03/2020   BUN 24 (H) 07/03/2020   CREATININE 1.39 (H) 07/03/2020   CALCIUM 9.1 07/03/2020   PROT 6.2 12/31/2019   ALBUMIN 4.2 12/31/2019   AST 20 12/31/2019   ALT 13 12/31/2019   ALKPHOS 67 12/31/2019   BILITOT 0.6 12/31/2019   GFRNONAA 50 (L) 07/03/2020   GFRAA 50 (L) 02/08/2020    Lab Results  Component Value Date   WBC 1.2 (LL) 07/03/2020   NEUTROABS 0.8 (L) 07/03/2020   HGB 8.3 (L) 07/03/2020   HCT 25.6 (L) 07/03/2020   MCV 101.6 (H) 07/03/2020   PLT 69 (L) 07/03/2020     STUDIES: No results found.    ONCOLOGY HISTORY:  Patient initially received cladribine in 2006 and then was treated for a relapse in 2011 and again in 2014. Bone marrow biopsy at that time confirmed recurrence of disease with 80-90% hairy cell leukemia noted in his specimen. Patient completed cladribine 0.15 mg/kg on days 1 through 5 along with 8 weekly cycles of Rituxan on April 18, 2015. Bone marrow biopsy results after completion of 8 weeks of Rituxan without evidence of residual disease.  Patient typically has no evidence of disease and peripheral blood.   ASSESSMENT: Recurrent hairy cell leukemia.  PLAN:   1.  Recurrent hairy cell leukemia: See oncology history as above.  Bone marrow biopsy on November 02, 2019 revealed recurrent hairy cell leukemia, but percentage of involvement was not reported.  Patient received day 1 of Rituxan and cladribine in April 2021, but then was admitted to the hospital.  His reason for admission does not appear related to treatment.  Patient continues to have significant pancytopenia indicating likely progression of disease.  Plan to repeat treatment regimen as above.  Proceed with cycle 1, day 1 Rituxan plus cladribine today.  Return to clinic Tuesday through Friday for cladribine only.  Patient will then return to clinic in 1 week for further  evaluation and consideration of cycle 2 which will be Rituxan only.  2. Thrombocytopenia: Chronic and unchanged.  Treatment as above. 3.  Leukopenia: White blood cell count trending down.  Treatment as above. 4.  Anemia: Improved with 1 unit of packed red blood cells.  Hemoglobin 8.3 today. 5.  Cardiac disease: Continue follow-up and treatment  per Dr. Rockey Situ. 6.  Renal insufficiency: Patient's creatinine is 1.39 today which is approximately his baseline.  Monitor.   Patient expressed understanding and was in agreement with this plan. He also understands that He can call clinic at any time with any questions, concerns, or complaints.    [name], MD 07/03/20 10:32 AM

## 2020-07-03 ENCOUNTER — Inpatient Hospital Stay: Payer: PPO | Admitting: Oncology

## 2020-07-03 ENCOUNTER — Other Ambulatory Visit: Payer: Self-pay

## 2020-07-03 ENCOUNTER — Inpatient Hospital Stay: Payer: PPO

## 2020-07-03 ENCOUNTER — Encounter: Payer: Self-pay | Admitting: Oncology

## 2020-07-03 VITALS — BP 125/74 | HR 63 | Temp 98.1°F | Resp 20

## 2020-07-03 VITALS — BP 128/55 | HR 58 | Temp 97.7°F | Wt 167.2 lb

## 2020-07-03 DIAGNOSIS — C9142 Hairy cell leukemia, in relapse: Secondary | ICD-10-CM

## 2020-07-03 DIAGNOSIS — Z5111 Encounter for antineoplastic chemotherapy: Secondary | ICD-10-CM | POA: Diagnosis not present

## 2020-07-03 LAB — CBC WITH DIFFERENTIAL/PLATELET
Abs Immature Granulocytes: 0 10*3/uL (ref 0.00–0.07)
Basophils Absolute: 0 10*3/uL (ref 0.0–0.1)
Basophils Relative: 0 %
Eosinophils Absolute: 0.1 10*3/uL (ref 0.0–0.5)
Eosinophils Relative: 5 %
HCT: 25.6 % — ABNORMAL LOW (ref 39.0–52.0)
Hemoglobin: 8.3 g/dL — ABNORMAL LOW (ref 13.0–17.0)
Immature Granulocytes: 0 %
Lymphocytes Relative: 20 %
Lymphs Abs: 0.2 10*3/uL — ABNORMAL LOW (ref 0.7–4.0)
MCH: 32.9 pg (ref 26.0–34.0)
MCHC: 32.4 g/dL (ref 30.0–36.0)
MCV: 101.6 fL — ABNORMAL HIGH (ref 80.0–100.0)
Monocytes Absolute: 0.1 10*3/uL (ref 0.1–1.0)
Monocytes Relative: 5 %
Neutro Abs: 0.8 10*3/uL — ABNORMAL LOW (ref 1.7–7.7)
Neutrophils Relative %: 70 %
Platelets: 69 10*3/uL — ABNORMAL LOW (ref 150–400)
RBC: 2.52 MIL/uL — ABNORMAL LOW (ref 4.22–5.81)
RDW: 18.9 % — ABNORMAL HIGH (ref 11.5–15.5)
WBC: 1.2 10*3/uL — CL (ref 4.0–10.5)
nRBC: 0 % (ref 0.0–0.2)

## 2020-07-03 LAB — BASIC METABOLIC PANEL
Anion gap: 10 (ref 5–15)
BUN: 24 mg/dL — ABNORMAL HIGH (ref 8–23)
CO2: 23 mmol/L (ref 22–32)
Calcium: 9.1 mg/dL (ref 8.9–10.3)
Chloride: 107 mmol/L (ref 98–111)
Creatinine, Ser: 1.39 mg/dL — ABNORMAL HIGH (ref 0.61–1.24)
GFR, Estimated: 50 mL/min — ABNORMAL LOW (ref >60)
Glucose, Bld: 97 mg/dL (ref 70–99)
Potassium: 3.6 mmol/L (ref 3.5–5.1)
Sodium: 140 mmol/L (ref 135–145)

## 2020-07-03 LAB — SAMPLE TO BLOOD BANK

## 2020-07-03 MED ORDER — DIPHENHYDRAMINE HCL 25 MG PO CAPS
25.0000 mg | ORAL_CAPSULE | Freq: Once | ORAL | Status: AC
  Administered 2020-07-03: 25 mg via ORAL
  Filled 2020-07-03: qty 1

## 2020-07-03 MED ORDER — SODIUM CHLORIDE 0.9 % IV SOLN
375.0000 mg/m2 | Freq: Once | INTRAVENOUS | Status: AC
  Administered 2020-07-03: 700 mg via INTRAVENOUS
  Filled 2020-07-03: qty 50

## 2020-07-03 MED ORDER — SODIUM CHLORIDE 0.9 % IV SOLN
Freq: Once | INTRAVENOUS | Status: AC
  Filled 2020-07-03: qty 250

## 2020-07-03 MED ORDER — PROCHLORPERAZINE MALEATE 10 MG PO TABS
10.0000 mg | ORAL_TABLET | Freq: Once | ORAL | Status: AC
  Administered 2020-07-03: 10 mg via ORAL
  Filled 2020-07-03: qty 1

## 2020-07-03 MED ORDER — FAMOTIDINE IN NACL 20-0.9 MG/50ML-% IV SOLN
20.0000 mg | Freq: Once | INTRAVENOUS | Status: AC
  Administered 2020-07-03: 20 mg via INTRAVENOUS
  Filled 2020-07-03: qty 50

## 2020-07-03 MED ORDER — SODIUM CHLORIDE 0.9 % IV SOLN
10.0000 mg | Freq: Once | INTRAVENOUS | Status: AC
  Administered 2020-07-03: 10 mg via INTRAVENOUS
  Filled 2020-07-03: qty 10

## 2020-07-03 MED ORDER — SODIUM CHLORIDE 0.9 % IV SOLN
0.1100 mg/kg | Freq: Once | INTRAVENOUS | Status: AC
  Administered 2020-07-03: 8 mg via INTRAVENOUS
  Filled 2020-07-03: qty 8

## 2020-07-03 MED ORDER — ACETAMINOPHEN 325 MG PO TABS
650.0000 mg | ORAL_TABLET | Freq: Once | ORAL | Status: AC
  Administered 2020-07-03: 650 mg via ORAL
  Filled 2020-07-03: qty 2

## 2020-07-03 NOTE — Progress Notes (Signed)
Patient denies any concerns today.  

## 2020-07-03 NOTE — Progress Notes (Signed)
Per Dr. [name] pt reports having a problem after being discharged last treatment. Per Dr. [name] pt to be monitored 1 hour post infusion. MD added additional pre-med's for the Ruxience treatment. Discussed treatment with Elmon [facility]. Will give Compazine and Cladribine, and then will give Tylenol, Benadryl, Decadron and Pepcid prior to [place].   1620: Pt tolerated infusion well. No s/s of distress or reaction noted at this time.

## 2020-07-03 NOTE — Progress Notes (Signed)
Tolerated remainder of infusion well. Monitored one hour post infusion. Stable at discharge

## 2020-07-04 ENCOUNTER — Inpatient Hospital Stay: Payer: PPO

## 2020-07-04 VITALS — BP 122/53 | HR 69 | Temp 98.2°F | Resp 16

## 2020-07-04 DIAGNOSIS — C9142 Hairy cell leukemia, in relapse: Secondary | ICD-10-CM

## 2020-07-04 DIAGNOSIS — Z5111 Encounter for antineoplastic chemotherapy: Secondary | ICD-10-CM | POA: Diagnosis not present

## 2020-07-04 MED ORDER — SODIUM CHLORIDE 0.9 % IV SOLN
0.1100 mg/kg | Freq: Once | INTRAVENOUS | Status: AC
  Administered 2020-07-04: 8 mg via INTRAVENOUS
  Filled 2020-07-04: qty 8

## 2020-07-04 MED ORDER — SODIUM CHLORIDE 0.9 % IV SOLN
Freq: Once | INTRAVENOUS | Status: AC
  Filled 2020-07-04: qty 250

## 2020-07-04 MED ORDER — SODIUM CHLORIDE 0.9% FLUSH
10.0000 mL | INTRAVENOUS | Status: DC | PRN
  Filled 2020-07-04: qty 10

## 2020-07-04 MED ORDER — PROCHLORPERAZINE MALEATE 10 MG PO TABS
10.0000 mg | ORAL_TABLET | Freq: Once | ORAL | Status: AC
  Administered 2020-07-04: 10 mg via ORAL
  Filled 2020-07-04: qty 1

## 2020-07-04 NOTE — Progress Notes (Signed)
Cladribine tolerated well. Discharged home in stable condition.

## 2020-07-05 ENCOUNTER — Inpatient Hospital Stay: Payer: PPO | Attending: Oncology

## 2020-07-05 ENCOUNTER — Other Ambulatory Visit: Payer: Self-pay

## 2020-07-05 VITALS — BP 121/69 | HR 62 | Temp 98.0°F

## 2020-07-05 DIAGNOSIS — M1990 Unspecified osteoarthritis, unspecified site: Secondary | ICD-10-CM | POA: Diagnosis not present

## 2020-07-05 DIAGNOSIS — Z5111 Encounter for antineoplastic chemotherapy: Secondary | ICD-10-CM | POA: Diagnosis not present

## 2020-07-05 DIAGNOSIS — I959 Hypotension, unspecified: Secondary | ICD-10-CM | POA: Diagnosis not present

## 2020-07-05 DIAGNOSIS — R5382 Chronic fatigue, unspecified: Secondary | ICD-10-CM | POA: Insufficient documentation

## 2020-07-05 DIAGNOSIS — C9142 Hairy cell leukemia, in relapse: Secondary | ICD-10-CM

## 2020-07-05 DIAGNOSIS — E785 Hyperlipidemia, unspecified: Secondary | ICD-10-CM | POA: Insufficient documentation

## 2020-07-05 DIAGNOSIS — N289 Disorder of kidney and ureter, unspecified: Secondary | ICD-10-CM | POA: Diagnosis not present

## 2020-07-05 DIAGNOSIS — C9140 Hairy cell leukemia not having achieved remission: Secondary | ICD-10-CM | POA: Diagnosis not present

## 2020-07-05 DIAGNOSIS — Z79899 Other long term (current) drug therapy: Secondary | ICD-10-CM | POA: Diagnosis not present

## 2020-07-05 DIAGNOSIS — R5081 Fever presenting with conditions classified elsewhere: Secondary | ICD-10-CM | POA: Insufficient documentation

## 2020-07-05 DIAGNOSIS — R0989 Other specified symptoms and signs involving the circulatory and respiratory systems: Secondary | ICD-10-CM | POA: Insufficient documentation

## 2020-07-05 DIAGNOSIS — Z7982 Long term (current) use of aspirin: Secondary | ICD-10-CM | POA: Diagnosis not present

## 2020-07-05 DIAGNOSIS — K219 Gastro-esophageal reflux disease without esophagitis: Secondary | ICD-10-CM | POA: Diagnosis not present

## 2020-07-05 DIAGNOSIS — D6959 Other secondary thrombocytopenia: Secondary | ICD-10-CM | POA: Diagnosis not present

## 2020-07-05 DIAGNOSIS — R5381 Other malaise: Secondary | ICD-10-CM | POA: Insufficient documentation

## 2020-07-05 DIAGNOSIS — J45909 Unspecified asthma, uncomplicated: Secondary | ICD-10-CM | POA: Insufficient documentation

## 2020-07-05 DIAGNOSIS — R531 Weakness: Secondary | ICD-10-CM | POA: Insufficient documentation

## 2020-07-05 DIAGNOSIS — I2510 Atherosclerotic heart disease of native coronary artery without angina pectoris: Secondary | ICD-10-CM | POA: Insufficient documentation

## 2020-07-05 DIAGNOSIS — I129 Hypertensive chronic kidney disease with stage 1 through stage 4 chronic kidney disease, or unspecified chronic kidney disease: Secondary | ICD-10-CM | POA: Diagnosis not present

## 2020-07-05 DIAGNOSIS — F0390 Unspecified dementia without behavioral disturbance: Secondary | ICD-10-CM | POA: Diagnosis not present

## 2020-07-05 DIAGNOSIS — Z5112 Encounter for antineoplastic immunotherapy: Secondary | ICD-10-CM | POA: Insufficient documentation

## 2020-07-05 DIAGNOSIS — N183 Chronic kidney disease, stage 3 unspecified: Secondary | ICD-10-CM | POA: Diagnosis not present

## 2020-07-05 DIAGNOSIS — D649 Anemia, unspecified: Secondary | ICD-10-CM | POA: Diagnosis not present

## 2020-07-05 MED ORDER — SODIUM CHLORIDE 0.9 % IV SOLN
Freq: Once | INTRAVENOUS | Status: AC
  Filled 2020-07-05: qty 250

## 2020-07-05 MED ORDER — SODIUM CHLORIDE 0.9 % IV SOLN
0.1100 mg/kg | Freq: Once | INTRAVENOUS | Status: AC
  Administered 2020-07-05: 8 mg via INTRAVENOUS
  Filled 2020-07-05: qty 8

## 2020-07-05 MED ORDER — PROCHLORPERAZINE MALEATE 10 MG PO TABS
10.0000 mg | ORAL_TABLET | Freq: Once | ORAL | Status: AC
  Administered 2020-07-05: 10 mg via ORAL
  Filled 2020-07-05: qty 1

## 2020-07-05 NOTE — Progress Notes (Signed)
Stable at discharge 

## 2020-07-06 ENCOUNTER — Inpatient Hospital Stay: Payer: PPO

## 2020-07-06 VITALS — BP 144/61 | HR 61 | Temp 97.3°F | Resp 20

## 2020-07-06 DIAGNOSIS — Z5111 Encounter for antineoplastic chemotherapy: Secondary | ICD-10-CM | POA: Diagnosis not present

## 2020-07-06 DIAGNOSIS — C9142 Hairy cell leukemia, in relapse: Secondary | ICD-10-CM

## 2020-07-06 MED ORDER — SODIUM CHLORIDE 0.9 % IV SOLN
0.1100 mg/kg | Freq: Once | INTRAVENOUS | Status: AC
  Administered 2020-07-06: 8 mg via INTRAVENOUS
  Filled 2020-07-06: qty 8

## 2020-07-06 MED ORDER — PROCHLORPERAZINE MALEATE 10 MG PO TABS
10.0000 mg | ORAL_TABLET | Freq: Once | ORAL | Status: AC
  Administered 2020-07-06: 10 mg via ORAL
  Filled 2020-07-06: qty 1

## 2020-07-06 MED ORDER — SODIUM CHLORIDE 0.9 % IV SOLN
Freq: Once | INTRAVENOUS | Status: AC
  Filled 2020-07-06: qty 250

## 2020-07-06 NOTE — Progress Notes (Signed)
1545- Patient tolerated treatment well. Patient stable and discharged to home at this time. 

## 2020-07-07 ENCOUNTER — Other Ambulatory Visit: Payer: Self-pay

## 2020-07-07 ENCOUNTER — Inpatient Hospital Stay: Payer: PPO

## 2020-07-07 ENCOUNTER — Other Ambulatory Visit: Payer: Self-pay | Admitting: Family Medicine

## 2020-07-07 VITALS — BP 143/69 | HR 62 | Temp 99.3°F | Resp 20

## 2020-07-07 DIAGNOSIS — C9142 Hairy cell leukemia, in relapse: Secondary | ICD-10-CM

## 2020-07-07 DIAGNOSIS — Z5111 Encounter for antineoplastic chemotherapy: Secondary | ICD-10-CM | POA: Diagnosis not present

## 2020-07-07 DIAGNOSIS — N400 Enlarged prostate without lower urinary tract symptoms: Secondary | ICD-10-CM

## 2020-07-07 MED ORDER — PROCHLORPERAZINE MALEATE 10 MG PO TABS
10.0000 mg | ORAL_TABLET | Freq: Once | ORAL | Status: AC
  Administered 2020-07-07: 10 mg via ORAL
  Filled 2020-07-07: qty 1

## 2020-07-07 MED ORDER — SODIUM CHLORIDE 0.9 % IV SOLN
0.1100 mg/kg | Freq: Once | INTRAVENOUS | Status: AC
  Administered 2020-07-07: 8 mg via INTRAVENOUS
  Filled 2020-07-07: qty 8

## 2020-07-07 MED ORDER — SODIUM CHLORIDE 0.9 % IV SOLN
Freq: Once | INTRAVENOUS | Status: AC
  Filled 2020-07-07: qty 250

## 2020-07-07 NOTE — Progress Notes (Signed)
Stable at discharge 

## 2020-07-10 ENCOUNTER — Inpatient Hospital Stay: Payer: PPO

## 2020-07-10 ENCOUNTER — Inpatient Hospital Stay (HOSPITAL_BASED_OUTPATIENT_CLINIC_OR_DEPARTMENT_OTHER): Payer: PPO | Admitting: Oncology

## 2020-07-10 ENCOUNTER — Encounter: Payer: Self-pay | Admitting: Oncology

## 2020-07-10 VITALS — BP 113/66 | HR 58 | Resp 18

## 2020-07-10 VITALS — BP 112/45 | HR 51 | Temp 97.6°F | Resp 18 | Wt 165.2 lb

## 2020-07-10 DIAGNOSIS — C9142 Hairy cell leukemia, in relapse: Secondary | ICD-10-CM | POA: Diagnosis not present

## 2020-07-10 DIAGNOSIS — D61818 Other pancytopenia: Secondary | ICD-10-CM | POA: Diagnosis not present

## 2020-07-10 DIAGNOSIS — Z5111 Encounter for antineoplastic chemotherapy: Secondary | ICD-10-CM | POA: Diagnosis not present

## 2020-07-10 LAB — CBC WITH DIFFERENTIAL/PLATELET
Abs Immature Granulocytes: 0 10*3/uL (ref 0.00–0.07)
Basophils Absolute: 0 10*3/uL (ref 0.0–0.1)
Basophils Relative: 0 %
Eosinophils Absolute: 0 10*3/uL (ref 0.0–0.5)
Eosinophils Relative: 6 %
HCT: 23.6 % — ABNORMAL LOW (ref 39.0–52.0)
Hemoglobin: 7.8 g/dL — ABNORMAL LOW (ref 13.0–17.0)
Immature Granulocytes: 0 %
Lymphocytes Relative: 6 %
Lymphs Abs: 0 10*3/uL — ABNORMAL LOW (ref 0.7–4.0)
MCH: 33.1 pg (ref 26.0–34.0)
MCHC: 33.1 g/dL (ref 30.0–36.0)
MCV: 100 fL (ref 80.0–100.0)
Monocytes Absolute: 0 10*3/uL — ABNORMAL LOW (ref 0.1–1.0)
Monocytes Relative: 0 %
Neutro Abs: 0.3 10*3/uL — CL (ref 1.7–7.7)
Neutrophils Relative %: 88 %
Platelets: 82 10*3/uL — ABNORMAL LOW (ref 150–400)
RBC: 2.36 MIL/uL — ABNORMAL LOW (ref 4.22–5.81)
RDW: 18.7 % — ABNORMAL HIGH (ref 11.5–15.5)
Smear Review: NORMAL
WBC: 0.4 10*3/uL — CL (ref 4.0–10.5)
nRBC: 0 % (ref 0.0–0.2)

## 2020-07-10 LAB — BASIC METABOLIC PANEL
Anion gap: 7 (ref 5–15)
BUN: 27 mg/dL — ABNORMAL HIGH (ref 8–23)
CO2: 25 mmol/L (ref 22–32)
Calcium: 8.8 mg/dL — ABNORMAL LOW (ref 8.9–10.3)
Chloride: 106 mmol/L (ref 98–111)
Creatinine, Ser: 1.38 mg/dL — ABNORMAL HIGH (ref 0.61–1.24)
GFR, Estimated: 50 mL/min — ABNORMAL LOW (ref >60)
Glucose, Bld: 101 mg/dL — ABNORMAL HIGH (ref 70–99)
Potassium: 3.8 mmol/L (ref 3.5–5.1)
Sodium: 138 mmol/L (ref 135–145)

## 2020-07-10 LAB — SAMPLE TO BLOOD BANK

## 2020-07-10 MED ORDER — FAMOTIDINE IN NACL 20-0.9 MG/50ML-% IV SOLN
20.0000 mg | Freq: Once | INTRAVENOUS | Status: AC
  Administered 2020-07-10: 20 mg via INTRAVENOUS
  Filled 2020-07-10: qty 50

## 2020-07-10 MED ORDER — ACETAMINOPHEN 325 MG PO TABS
650.0000 mg | ORAL_TABLET | Freq: Once | ORAL | Status: AC
  Administered 2020-07-10: 650 mg via ORAL
  Filled 2020-07-10: qty 2

## 2020-07-10 MED ORDER — SODIUM CHLORIDE 0.9 % IV SOLN: 375.0000 mg/m2 | Freq: Once | INTRAVENOUS | Status: DC

## 2020-07-10 MED ORDER — DIPHENHYDRAMINE HCL 25 MG PO CAPS
25.0000 mg | ORAL_CAPSULE | Freq: Once | ORAL | Status: AC
  Administered 2020-07-10: 25 mg via ORAL
  Filled 2020-07-10: qty 1

## 2020-07-10 MED ORDER — SODIUM CHLORIDE 0.9 % IV SOLN
Freq: Once | INTRAVENOUS | Status: AC
  Filled 2020-07-10: qty 250

## 2020-07-10 MED ORDER — SODIUM CHLORIDE 0.9 % IV SOLN
375.0000 mg/m2 | Freq: Once | INTRAVENOUS | Status: AC
  Administered 2020-07-10: 700 mg via INTRAVENOUS
  Filled 2020-07-10: qty 50

## 2020-07-10 MED ORDER — SODIUM CHLORIDE 0.9 % IV SOLN
10.0000 mg | Freq: Once | INTRAVENOUS | Status: AC
  Administered 2020-07-10: 10 mg via INTRAVENOUS
  Filled 2020-07-10: qty 10

## 2020-07-10 NOTE — Progress Notes (Signed)
HGB 7.8, ANC 0.3, Plt 82, HR 51. Per Dr. [name], okay to proceed with treatment.   Per Rulon Abide, NP, patient okay to infuse Ruxience rapid.   Patient monitored x 1 hour after ruxience infusion. Patient tolerated infusion well. Patient to be discharged home with son, [name].

## 2020-07-10 NOTE — Progress Notes (Signed)
[place]  [phone] [phone]  ID: BRAXSTON QUINTER OB: 04/01/1933  MR#: [id number]  QPR#:[id number]  Patient Care Team: [name], MD as PCP - General (Family Medicine) Rockey Situ Kathlene November, MD as PCP - Cardiology (Cardiology) [name], MD as Consulting Physician (Cardiology) [name], MD as Consulting Physician (Oncology) [name] as Physician Assistant (Urology)  CHIEF COMPLAINT: Recurrent hairy cell leukemia.  INTERVAL HISTORY: Patient returns to clinic today for further evaluation prior to cycle 2 single agent Rituxan.  He received cycle 1 Rituxan on 07/03/2020 along with cladribine D1-D5.  He appears to have tolerated well.  He feels the same as last week.  He has chronic weakness and fatigue.  Confusion is stable. He has no new neurologic complaints.  He denies any recent fevers or illnesses.  He denies any chest pain, shortness of breath, cough, or hemoptysis.  He denies nausea, vomiting, diarrhea or constipation.  He has no urinary complaints.  Patient offers no further specific complaints today.  REVIEW OF SYSTEMS:   Review of Systems  Constitutional: Positive for malaise/fatigue. Negative for fever and weight loss.  Respiratory: Negative.  Negative for cough, hemoptysis and shortness of breath.   Cardiovascular: Negative.  Negative for chest pain and leg swelling.  Gastrointestinal: Negative.  Negative for abdominal pain.  Genitourinary: Negative.  Negative for dysuria.  Musculoskeletal: Negative.  Negative for back pain.  Skin: Negative.  Negative for rash.  Neurological: Positive for weakness. Negative for dizziness, focal weakness and headaches.  Psychiatric/Behavioral: Positive for memory loss. The patient is not nervous/anxious.     As per HPI. Otherwise, a complete review of systems is negative.  PAST MEDICAL HISTORY: Past Medical History:  Diagnosis Date  . Anemia 09/01/2015  .  Anxiety 09/11/2015  . Arthritis   . Asthma    as a teenager  . Asymptomatic Sinus Bradycardia   . Atypical chest pain    a. 08/2014  . BPH (benign prostatic hyperplasia)   . CKD (chronic kidney disease), stage III ([place])   . Closed fracture of tuft of distal phalanx of finger 08/31/2018  . Coronary artery disease    a. 2000 s/p CABG;  b. 10/2008 Neg MV, EF 64%; c. cath 05/2015: LM 85% sev cal, pLAD-1 lesion 80%, pLAD-2 lesion 100% chronic, ostLCx 70% sev cal, OM4 90%, ostRCA 70%, mid RCA 90%, LIMA-LAD patent, VG-OM2 patent, VG-OM3 patent, VG-RPDA patent. No AS  . Dementia without behavioral disturbance ([place]) 05/28/2019  . GERD (gastroesophageal reflux disease)   . H/O echocardiogram    a. 10/2005 Echo: nl EF.  [name] Hairy cell leukemia ([place])   . HOH (hard of hearing)    Bilateral hearing aids  . HTN (hypertension)   . Hyperlipidemia   . Hypertension   . Leukemia ([place])   . Neutropenic fever ([place]) 11/08/2019  . Stricture and stenosis of esophagus     PAST SURGICAL HISTORY: Past Surgical History:  Procedure Laterality Date  . CARDIAC CATHETERIZATION N/A 05/26/2015   Procedure: Left Heart Cath;  Surgeon: [name], MD;  Location: [place] [facility];  Service: Cardiovascular;  Laterality: N/A;  . CARDIAC CATHETERIZATION N/A 05/26/2015   Procedure: Coronary/Graft Angiography;  Surgeon: [name], MD;  Location: [place] [facility];  Service: Cardiovascular;  Laterality: N/A;  . CATARACT EXTRACTION    . CORONARY ARTERY BYPASS GRAFT  2000  . ESOPHAGOGASTRODUODENOSCOPY N/A 02/18/2017   Procedure: ESOPHAGOGASTRODUODENOSCOPY (EGD) with removal of food  bolus;  Surgeon: [name], MD;  Location: [facility] ENDOSCOPY;  Service: Endoscopy;  Laterality: N/A;  . ESOPHAGOGASTRODUODENOSCOPY (EGD) WITH PROPOFOL N/A 03/11/2017   Procedure: ESOPHAGOGASTRODUODENOSCOPY (EGD) WITH PROPOFOL;  Surgeon: [name], MD;  Location: [facility] ENDOSCOPY;  Service: Endoscopy;  Laterality: N/A;  .  ESOPHAGOGASTRODUODENOSCOPY (EGD) WITH PROPOFOL N/A 04/15/2017   Procedure: ESOPHAGOGASTRODUODENOSCOPY (EGD) WITH PROPOFOL;  Surgeon: [name], MD;  Location: [facility] ENDOSCOPY;  Service: Endoscopy;  Laterality: N/A;  . EYE SURGERY Left    Cataract Extraction with IOL  . INGUINAL HERNIA REPAIR Left 06/24/2016   Procedure: HERNIA REPAIR INGUINAL ADULT;  Surgeon: [name], MD;  Location: [facility] ORS;  Service: General;  Laterality: Left;  . TONSILLECTOMY  1942   . UPPER GASTROINTESTINAL ENDOSCOPY  3 years ago    with Dilation.     FAMILY HISTORY Family History  Problem Relation Age of Onset  . Hypertension Mother   . Arthritis Father   . Other Other        no premature CAD.  [name] Colon cancer Neg Hx   . Prostate cancer Neg Hx   . Stomach cancer Neg Hx        ADVANCED DIRECTIVES:    HEALTH MAINTENANCE: Social History   Tobacco Use  . Smoking status: Never Smoker  . Smokeless tobacco: Never Used  Vaping Use  . Vaping Use: Never used  Substance Use Topics  . Alcohol use: No  . Drug use: No     No Known Allergies  Current Outpatient Medications  Medication Sig Dispense Refill  . aspirin EC 81 MG tablet Take 81 mg by mouth daily.    [name] atorvastatin (LIPITOR) 40 MG tablet TAKE 1/2 TABLETS (20 MG TOTAL) BY MOUTH DAILY. 45 tablet 3  . Calcium Carbonate Antacid (TUMS CHEWY BITES PO) Take 1 tablet by mouth as needed.     [name] CLINPRO 5000 1.1 % PSTE APPLY PEASIZED AMOUNT, BRUSH DAILY AT LEAST 2 MINUTES. SPIT AFTER USE    . donepezil (ARICEPT) 10 MG tablet Take 1 tablet (10 mg total) by mouth at bedtime. 90 tablet 1  . finasteride (PROSCAR) 5 MG tablet TAKE 1 TABLET BY MOUTH EVERY DAY 30 tablet 1  . FLUZONE HIGH-DOSE QUADRIVALENT 0.7 ML SUSY     . isosorbide mononitrate (IMDUR) 30 MG 24 hr tablet TAKE 1 TABLET BY MOUTH EVERY DAY 90 tablet 3  . Multiple Vitamin (MULTIVITAMIN) tablet Take 1 tablet by mouth daily.      . ondansetron (ZOFRAN) 8 MG tablet Take 1 tablet (8 mg total)  by mouth 2 (two) times daily as needed (Nausea or vomiting). 30 tablet 1  . prochlorperazine (COMPAZINE) 10 MG tablet Take 1 tablet (10 mg total) by mouth every 6 (six) hours as needed (Nausea or vomiting). 60 tablet 2  . tamsulosin (FLOMAX) 0.4 MG CAPS capsule TAKE 2 CAPSULES BY MOUTH EVERY DAY 180 capsule 1   No current facility-administered medications for this visit.    OBJECTIVE: There were no vitals filed for this visit.   There is no height or weight on file to calculate BMI.    ECOG FS:1 - Symptomatic but completely ambulatory  Physical Exam Constitutional:      Appearance: Normal appearance.  HENT:     Head: Normocephalic and atraumatic.  Eyes:     Pupils: Pupils are equal, round, and reactive to light.  Cardiovascular:     Rate and Rhythm: Normal rate and regular rhythm.     Heart sounds:  Normal heart sounds. No murmur heard.   Pulmonary:     Effort: Pulmonary effort is normal.     Breath sounds: Normal breath sounds. No wheezing.  Abdominal:     General: Bowel sounds are normal. There is no distension.     Palpations: Abdomen is soft.     Tenderness: There is no abdominal tenderness.  Musculoskeletal:        General: Normal range of motion.     Cervical back: Normal range of motion.  Skin:    General: Skin is warm and dry.     Findings: No rash.  Neurological:     Mental Status: He is alert and oriented to person, place, and time.  Psychiatric:        Judgment: Judgment normal.     LAB RESULTS:  Lab Results  Component Value Date   NA 138 07/10/2020   K 3.8 07/10/2020   CL 106 07/10/2020   CO2 25 07/10/2020   GLUCOSE 101 (H) 07/10/2020   BUN 27 (H) 07/10/2020   CREATININE 1.38 (H) 07/10/2020   CALCIUM 8.8 (L) 07/10/2020   PROT 6.2 12/31/2019   ALBUMIN 4.2 12/31/2019   AST 20 12/31/2019   ALT 13 12/31/2019   ALKPHOS 67 12/31/2019   BILITOT 0.6 12/31/2019   GFRNONAA 50 (L) 07/10/2020   GFRAA 50 (L) 02/08/2020    Lab Results  Component Value Date    WBC 1.2 (LL) 07/03/2020   NEUTROABS 0.8 (L) 07/03/2020   HGB 8.3 (L) 07/03/2020   HCT 25.6 (L) 07/03/2020   MCV 101.6 (H) 07/03/2020   PLT 69 (L) 07/03/2020     STUDIES: No results found.    ONCOLOGY HISTORY:  Patient initially received cladribine in 2006 and then was treated for a relapse in 2011 and again in 2014. Bone marrow biopsy at that time confirmed recurrence of disease with 80-90% hairy cell leukemia noted in his specimen. Patient completed cladribine 0.15 mg/kg on days 1 through 5 along with 8 weekly cycles of Rituxan on April 18, 2015. Bone marrow biopsy results after completion of 8 weeks of Rituxan without evidence of residual disease.  Patient typically has no evidence of disease and peripheral blood.   ASSESSMENT: Recurrent hairy cell leukemia.  PLAN:   1.  Recurrent hairy cell leukemia:  -Bone marrow from 11/02/2019 revealed recurrent hairy cell leukemia. -Received day 1 of Rituxan and cladribine in April 2021. -Treatment complicated by admission to the hospital for infection of unclear source. -Lab work continues to show persistent pancytopenia likely indicating progression of disease. -Plan is to repeat previous treatment with Rituxan on day 1+ cladribine D1-D5.  -Received cycle 1 Rituxan and cladribine last week. -Proceed with cycle 2 single agent Rituxan only. -Discussed with pharmacy about increasing his infusion rate.  Previously tolerated rapid infusion back in 2016.  Okay to proceed with increased rate today.  Tolerated infusion well last week. -Labs from today show a stable metabolic panel.  He has significant neutropenia with a white count of 0.4 and ANC of 300.  Spoke with Dr. [name], okay to proceed with Rituxan only. 2. Thrombocytopenia:  -Secondary to treatment and disease. -Platelet count 82,000 today.   -Chronic and unchanged.  3.  Leukopenia:  -Secondary to treatment and disease -ANC 300, WBC 0.4 -Proceed with treatment 4.  Anemia:   -Secondary to treatment and disease -Receives intermittent blood transfusions.  -Hemoglobin today is 7.8.   -Recheck labs in 1 week with hold tube for possible blood.  5.  Cardiac disease:  -Continue follow-up and treatment per Dr. Rockey Situ. 6.  Renal insufficiency:  -Patient's creatinine is 1.38 today which is approximately his baseline.  Disposition: -RTC as scheduled in 1 week with repeat labs, hold tube and single agent Rituxan.  Patient expressed understanding and was in agreement with this plan. He also understands that He can call clinic at any time with any questions, concerns, or complaints.   Greater than 50% was spent in counseling and coordination of care with this patient including but not limited to discussion of the relevant topics above (See A&P) including, but not limited to diagnosis and management of acute and chronic medical conditions.   Jacquelin Hawking, NP 07/10/20 8:46 AM

## 2020-07-10 NOTE — Progress Notes (Signed)
Patient denies any concerns today.  

## 2020-07-12 ENCOUNTER — Other Ambulatory Visit: Payer: Self-pay | Admitting: Oncology

## 2020-07-15 ENCOUNTER — Other Ambulatory Visit: Payer: Self-pay | Admitting: Family Medicine

## 2020-07-17 ENCOUNTER — Ambulatory Visit
Admission: EM | Admit: 2020-07-17 | Discharge: 2020-07-17 | Disposition: A | Discharge status: Home / Self Care | Payer: PPO | Attending: Family Medicine | Admitting: Family Medicine

## 2020-07-17 ENCOUNTER — Ambulatory Visit (INDEPENDENT_AMBULATORY_CARE_PROVIDER_SITE_OTHER): Payer: PPO

## 2020-07-17 ENCOUNTER — Encounter: Payer: Self-pay | Admitting: Family Medicine

## 2020-07-17 ENCOUNTER — Ambulatory Visit: Payer: PPO

## 2020-07-17 DIAGNOSIS — J209 Acute bronchitis, unspecified: Secondary | ICD-10-CM

## 2020-07-17 DIAGNOSIS — R0602 Shortness of breath: Secondary | ICD-10-CM

## 2020-07-17 DIAGNOSIS — R059 Cough, unspecified: Secondary | ICD-10-CM

## 2020-07-17 DIAGNOSIS — Z7689 Persons encountering health services in other specified circumstances: Secondary | ICD-10-CM | POA: Diagnosis not present

## 2020-07-17 MED ORDER — ACETAMINOPHEN 325 MG PO TABS
650.0000 mg | ORAL_TABLET | Freq: Once | ORAL | Status: AC
  Administered 2020-07-17: 17:00:00 650 mg via ORAL

## 2020-07-17 MED ORDER — AZITHROMYCIN 250 MG PO TABS: ORAL_TABLET | ORAL | 0 refills | Status: DC

## 2020-07-17 NOTE — Telephone Encounter (Signed)
Patient's son called to make an appointment. At the time of call no appointments were available until Wednesday with another provider. Patient's son said he is going to take his father to urgent care cross the street from office.

## 2020-07-17 NOTE — ED Triage Notes (Signed)
Pt presents to Urgent Care with c/o cough, nasal congestion, and dyspnea x several days. Pt w/ no known COVID exposure; has been vaccinated.

## 2020-07-17 NOTE — Discharge Instructions (Addendum)
Take the antibiotics as prescribed. Flu and Covid test pending. Tylenol for fever as needed.  Mucinex for cough and congestion Make sure to rest and drink plenty of fluids Follow up as needed for continued or worsening symptoms

## 2020-07-17 NOTE — Telephone Encounter (Signed)
Noted. Agree with urgent care evaluation. Please follow-up to make sure he was seen.

## 2020-07-18 ENCOUNTER — Other Ambulatory Visit: Payer: Self-pay

## 2020-07-18 ENCOUNTER — Inpatient Hospital Stay (HOSPITAL_BASED_OUTPATIENT_CLINIC_OR_DEPARTMENT_OTHER): Payer: PPO | Admitting: Oncology

## 2020-07-18 ENCOUNTER — Encounter: Payer: Self-pay | Admitting: Emergency Medicine

## 2020-07-18 ENCOUNTER — Inpatient Hospital Stay
Admission: EM | Admit: 2020-07-18 | Discharge: 2020-07-24 | DRG: 808 | Disposition: A | Discharge status: Home Health | Payer: PPO | Source: Ambulatory Visit | Attending: Family Medicine | Admitting: Family Medicine

## 2020-07-18 ENCOUNTER — Inpatient Hospital Stay: Payer: PPO

## 2020-07-18 ENCOUNTER — Emergency Department: Payer: PPO

## 2020-07-18 ENCOUNTER — Encounter: Payer: Self-pay | Admitting: Oncology

## 2020-07-18 VITALS — BP 99/55 | HR 90 | Temp 100.4°F | Resp 20 | Wt 167.8 lb

## 2020-07-18 DIAGNOSIS — C9140 Hairy cell leukemia not having achieved remission: Secondary | ICD-10-CM | POA: Diagnosis not present

## 2020-07-18 DIAGNOSIS — C9142 Hairy cell leukemia, in relapse: Secondary | ICD-10-CM | POA: Diagnosis not present

## 2020-07-18 DIAGNOSIS — I10 Essential (primary) hypertension: Secondary | ICD-10-CM | POA: Diagnosis not present

## 2020-07-18 DIAGNOSIS — F0390 Unspecified dementia without behavioral disturbance: Secondary | ICD-10-CM | POA: Diagnosis present

## 2020-07-18 DIAGNOSIS — D649 Anemia, unspecified: Secondary | ICD-10-CM | POA: Diagnosis not present

## 2020-07-18 DIAGNOSIS — B370 Candidal stomatitis: Secondary | ICD-10-CM | POA: Diagnosis not present

## 2020-07-18 DIAGNOSIS — J189 Pneumonia, unspecified organism: Secondary | ICD-10-CM | POA: Diagnosis not present

## 2020-07-18 DIAGNOSIS — N1831 Chronic kidney disease, stage 3a: Secondary | ICD-10-CM | POA: Diagnosis not present

## 2020-07-18 DIAGNOSIS — Z8261 Family history of arthritis: Secondary | ICD-10-CM | POA: Diagnosis not present

## 2020-07-18 DIAGNOSIS — R0602 Shortness of breath: Secondary | ICD-10-CM | POA: Diagnosis not present

## 2020-07-18 DIAGNOSIS — N1832 Chronic kidney disease, stage 3b: Secondary | ICD-10-CM

## 2020-07-18 DIAGNOSIS — Z9221 Personal history of antineoplastic chemotherapy: Secondary | ICD-10-CM | POA: Diagnosis not present

## 2020-07-18 DIAGNOSIS — I4510 Unspecified right bundle-branch block: Secondary | ICD-10-CM | POA: Diagnosis present

## 2020-07-18 DIAGNOSIS — J9811 Atelectasis: Secondary | ICD-10-CM | POA: Diagnosis not present

## 2020-07-18 DIAGNOSIS — N390 Urinary tract infection, site not specified: Secondary | ICD-10-CM | POA: Diagnosis not present

## 2020-07-18 DIAGNOSIS — B3789 Other sites of candidiasis: Secondary | ICD-10-CM | POA: Diagnosis not present

## 2020-07-18 DIAGNOSIS — R531 Weakness: Secondary | ICD-10-CM | POA: Diagnosis not present

## 2020-07-18 DIAGNOSIS — Z951 Presence of aortocoronary bypass graft: Secondary | ICD-10-CM

## 2020-07-18 DIAGNOSIS — J90 Pleural effusion, not elsewhere classified: Secondary | ICD-10-CM | POA: Diagnosis not present

## 2020-07-18 DIAGNOSIS — R5081 Fever presenting with conditions classified elsewhere: Secondary | ICD-10-CM

## 2020-07-18 DIAGNOSIS — D709 Neutropenia, unspecified: Principal | ICD-10-CM | POA: Diagnosis present

## 2020-07-18 DIAGNOSIS — I4581 Long QT syndrome: Secondary | ICD-10-CM | POA: Diagnosis not present

## 2020-07-18 DIAGNOSIS — R918 Other nonspecific abnormal finding of lung field: Secondary | ICD-10-CM | POA: Diagnosis not present

## 2020-07-18 DIAGNOSIS — J209 Acute bronchitis, unspecified: Secondary | ICD-10-CM | POA: Diagnosis not present

## 2020-07-18 DIAGNOSIS — E876 Hypokalemia: Secondary | ICD-10-CM | POA: Diagnosis present

## 2020-07-18 DIAGNOSIS — T451X5A Adverse effect of antineoplastic and immunosuppressive drugs, initial encounter: Secondary | ICD-10-CM | POA: Diagnosis present

## 2020-07-18 DIAGNOSIS — I2510 Atherosclerotic heart disease of native coronary artery without angina pectoris: Secondary | ICD-10-CM | POA: Diagnosis present

## 2020-07-18 DIAGNOSIS — R509 Fever, unspecified: Secondary | ICD-10-CM | POA: Diagnosis not present

## 2020-07-18 DIAGNOSIS — Z66 Do not resuscitate: Secondary | ICD-10-CM | POA: Diagnosis present

## 2020-07-18 DIAGNOSIS — N400 Enlarged prostate without lower urinary tract symptoms: Secondary | ICD-10-CM | POA: Diagnosis not present

## 2020-07-18 DIAGNOSIS — H9190 Unspecified hearing loss, unspecified ear: Secondary | ICD-10-CM | POA: Diagnosis present

## 2020-07-18 DIAGNOSIS — Z7982 Long term (current) use of aspirin: Secondary | ICD-10-CM

## 2020-07-18 DIAGNOSIS — E785 Hyperlipidemia, unspecified: Secondary | ICD-10-CM | POA: Diagnosis present

## 2020-07-18 DIAGNOSIS — J9601 Acute respiratory failure with hypoxia: Secondary | ICD-10-CM | POA: Diagnosis present

## 2020-07-18 DIAGNOSIS — M1990 Unspecified osteoarthritis, unspecified site: Secondary | ICD-10-CM | POA: Diagnosis present

## 2020-07-18 DIAGNOSIS — F419 Anxiety disorder, unspecified: Secondary | ICD-10-CM | POA: Diagnosis not present

## 2020-07-18 DIAGNOSIS — J45909 Unspecified asthma, uncomplicated: Secondary | ICD-10-CM | POA: Diagnosis present

## 2020-07-18 DIAGNOSIS — K2100 Gastro-esophageal reflux disease with esophagitis, without bleeding: Secondary | ICD-10-CM | POA: Diagnosis not present

## 2020-07-18 DIAGNOSIS — D61818 Other pancytopenia: Secondary | ICD-10-CM | POA: Diagnosis present

## 2020-07-18 DIAGNOSIS — Z79899 Other long term (current) drug therapy: Secondary | ICD-10-CM

## 2020-07-18 DIAGNOSIS — A419 Sepsis, unspecified organism: Secondary | ICD-10-CM | POA: Diagnosis not present

## 2020-07-18 DIAGNOSIS — K224 Dyskinesia of esophagus: Secondary | ICD-10-CM

## 2020-07-18 DIAGNOSIS — Z515 Encounter for palliative care: Secondary | ICD-10-CM | POA: Diagnosis not present

## 2020-07-18 DIAGNOSIS — Z20822 Contact with and (suspected) exposure to covid-19: Secondary | ICD-10-CM | POA: Diagnosis present

## 2020-07-18 DIAGNOSIS — N183 Chronic kidney disease, stage 3 unspecified: Secondary | ICD-10-CM | POA: Diagnosis present

## 2020-07-18 DIAGNOSIS — Z8249 Family history of ischemic heart disease and other diseases of the circulatory system: Secondary | ICD-10-CM

## 2020-07-18 DIAGNOSIS — B952 Enterococcus as the cause of diseases classified elsewhere: Secondary | ICD-10-CM | POA: Diagnosis not present

## 2020-07-18 DIAGNOSIS — R001 Bradycardia, unspecified: Secondary | ICD-10-CM | POA: Diagnosis not present

## 2020-07-18 DIAGNOSIS — K219 Gastro-esophageal reflux disease without esophagitis: Secondary | ICD-10-CM

## 2020-07-18 LAB — CBC WITH DIFFERENTIAL/PLATELET
Abs Immature Granulocytes: 0.01 10*3/uL (ref 0.00–0.07)
Abs Immature Granulocytes: 0.01 10*3/uL (ref 0.00–0.07)
Basophils Absolute: 0 10*3/uL (ref 0.0–0.1)
Basophils Absolute: 0 10*3/uL (ref 0.0–0.1)
Basophils Relative: 0 %
Basophils Relative: 0 %
Eosinophils Absolute: 0 10*3/uL (ref 0.0–0.5)
Eosinophils Absolute: 0 10*3/uL (ref 0.0–0.5)
Eosinophils Relative: 1 %
Eosinophils Relative: 0 %
HCT: 18.8 % — ABNORMAL LOW (ref 39.0–52.0)
HCT: 17 % — ABNORMAL LOW (ref 39.0–52.0)
Hemoglobin: 6.2 g/dL — ABNORMAL LOW (ref 13.0–17.0)
Hemoglobin: 5.5 g/dL — ABNORMAL LOW (ref 13.0–17.0)
Immature Granulocytes: 1 %
Immature Granulocytes: 1 %
Lymphocytes Relative: 1 %
Lymphocytes Relative: 3 %
Lymphs Abs: 0 10*3/uL — ABNORMAL LOW (ref 0.7–4.0)
Lymphs Abs: 0 10*3/uL — ABNORMAL LOW (ref 0.7–4.0)
MCH: 33.9 pg (ref 26.0–34.0)
MCH: 33.3 pg (ref 26.0–34.0)
MCHC: 33 g/dL (ref 30.0–36.0)
MCHC: 32.4 g/dL (ref 30.0–36.0)
MCV: 102.7 fL — ABNORMAL HIGH (ref 80.0–100.0)
MCV: 103 fL — ABNORMAL HIGH (ref 80.0–100.0)
Monocytes Absolute: 0 10*3/uL — ABNORMAL LOW (ref 0.1–1.0)
Monocytes Absolute: 0 10*3/uL — ABNORMAL LOW (ref 0.1–1.0)
Monocytes Relative: 5 %
Monocytes Relative: 6 %
Neutro Abs: 0.7 10*3/uL — ABNORMAL LOW (ref 1.7–7.7)
Neutro Abs: 0.7 10*3/uL — ABNORMAL LOW (ref 1.7–7.7)
Neutrophils Relative %: 92 %
Neutrophils Relative %: 90 %
Platelets: 83 10*3/uL — ABNORMAL LOW (ref 150–400)
Platelets: 71 10*3/uL — ABNORMAL LOW (ref 150–400)
RBC: 1.83 MIL/uL — ABNORMAL LOW (ref 4.22–5.81)
RBC: 1.65 MIL/uL — ABNORMAL LOW (ref 4.22–5.81)
RDW: 19.4 % — ABNORMAL HIGH (ref 11.5–15.5)
RDW: 19.6 % — ABNORMAL HIGH (ref 11.5–15.5)
Smear Review: NORMAL
Smear Review: NORMAL
WBC: 0.8 10*3/uL — CL (ref 4.0–10.5)
WBC: 0.7 10*3/uL — CL (ref 4.0–10.5)
nRBC: 0 % (ref 0.0–0.2)
nRBC: 0 % (ref 0.0–0.2)

## 2020-07-18 LAB — URINALYSIS, COMPLETE (UACMP) WITH MICROSCOPIC
Bacteria, UA: NONE SEEN
Bilirubin Urine: NEGATIVE
Glucose, UA: NEGATIVE mg/dL
Hgb urine dipstick: NEGATIVE
Ketones, ur: NEGATIVE mg/dL
Leukocytes,Ua: NEGATIVE
Nitrite: NEGATIVE
Protein, ur: NEGATIVE mg/dL
Specific Gravity, Urine: 1.019 (ref 1.005–1.030)
pH: 5 (ref 5.0–8.0)

## 2020-07-18 LAB — RESP PANEL BY RT-PCR (FLU A&B, COVID) ARPGX2
Influenza A by PCR: NEGATIVE
Influenza B by PCR: NEGATIVE
SARS Coronavirus 2 by RT PCR: NEGATIVE

## 2020-07-18 LAB — BASIC METABOLIC PANEL
Anion gap: 13 (ref 5–15)
BUN: 24 mg/dL — ABNORMAL HIGH (ref 8–23)
CO2: 19 mmol/L — ABNORMAL LOW (ref 22–32)
Calcium: 8.5 mg/dL — ABNORMAL LOW (ref 8.9–10.3)
Chloride: 102 mmol/L (ref 98–111)
Creatinine, Ser: 1.43 mg/dL — ABNORMAL HIGH (ref 0.61–1.24)
GFR, Estimated: 48 mL/min — ABNORMAL LOW (ref >60)
Glucose, Bld: 153 mg/dL — ABNORMAL HIGH (ref 70–99)
Potassium: 3.6 mmol/L (ref 3.5–5.1)
Sodium: 134 mmol/L — ABNORMAL LOW (ref 135–145)

## 2020-07-18 LAB — COMPREHENSIVE METABOLIC PANEL
ALT: 12 U/L (ref 0–44)
AST: 34 U/L (ref 15–41)
Albumin: 3.3 g/dL — ABNORMAL LOW (ref 3.5–5.0)
Alkaline Phosphatase: 47 U/L (ref 38–126)
Anion gap: 8 (ref 5–15)
BUN: 26 mg/dL — ABNORMAL HIGH (ref 8–23)
CO2: 22 mmol/L (ref 22–32)
Calcium: 8.4 mg/dL — ABNORMAL LOW (ref 8.9–10.3)
Chloride: 104 mmol/L (ref 98–111)
Creatinine, Ser: 1.34 mg/dL — ABNORMAL HIGH (ref 0.61–1.24)
GFR, Estimated: 52 mL/min — ABNORMAL LOW (ref >60)
Glucose, Bld: 133 mg/dL — ABNORMAL HIGH (ref 70–99)
Potassium: 4.3 mmol/L (ref 3.5–5.1)
Sodium: 134 mmol/L — ABNORMAL LOW (ref 135–145)
Total Bilirubin: 2 mg/dL — ABNORMAL HIGH (ref 0.3–1.2)
Total Protein: 6.3 g/dL — ABNORMAL LOW (ref 6.5–8.1)

## 2020-07-18 LAB — BRAIN NATRIURETIC PEPTIDE: B Natriuretic Peptide: 158.5 pg/mL — ABNORMAL HIGH (ref 0.0–100.0)

## 2020-07-18 LAB — LACTIC ACID, PLASMA
Lactic Acid, Venous: 1.6 mmol/L (ref 0.5–1.9)
Lactic Acid, Venous: 0.9 mmol/L (ref 0.5–1.9)

## 2020-07-18 LAB — PREPARE RBC (CROSSMATCH)

## 2020-07-18 MED ORDER — ONDANSETRON HCL 4 MG PO TABS: 4.0000 mg | ORAL_TABLET | Freq: Four times a day (QID) | ORAL | Status: DC | PRN

## 2020-07-18 MED ORDER — ACETAMINOPHEN 325 MG PO TABS
650.0000 mg | ORAL_TABLET | Freq: Four times a day (QID) | ORAL | Status: DC | PRN
  Administered 2020-07-18 – 2020-07-23 (×2): 650 mg via ORAL
  Filled 2020-07-18 (×2): qty 2

## 2020-07-18 MED ORDER — SODIUM CHLORIDE 0.9 % IV SOLN: INTRAVENOUS | Status: DC

## 2020-07-18 MED ORDER — TAMSULOSIN HCL 0.4 MG PO CAPS
0.8000 mg | ORAL_CAPSULE | Freq: Every day | ORAL | Status: DC
  Filled 2020-07-18: qty 2

## 2020-07-18 MED ORDER — TAMSULOSIN HCL 0.4 MG PO CAPS
0.8000 mg | ORAL_CAPSULE | Freq: Every day | ORAL | Status: DC
  Administered 2020-07-18 – 2020-07-23 (×4): 0.8 mg via ORAL
  Filled 2020-07-18 (×6): qty 2

## 2020-07-18 MED ORDER — SODIUM CHLORIDE 0.9 % IV SOLN
2.0000 g | Freq: Two times a day (BID) | INTRAVENOUS | Status: DC
  Administered 2020-07-18 – 2020-07-20 (×4): 2 g via INTRAVENOUS
  Filled 2020-07-18 (×6): qty 2
  Filled 2020-07-18: qty 0.4
  Filled 2020-07-18: qty 2

## 2020-07-18 MED ORDER — VANCOMYCIN HCL 1500 MG/300ML IV SOLN
1500.0000 mg | Freq: Once | INTRAVENOUS | Status: AC
  Administered 2020-07-18: 12:00:00 1500 mg via INTRAVENOUS
  Filled 2020-07-18: qty 300

## 2020-07-18 MED ORDER — SODIUM CHLORIDE 0.9% IV SOLUTION
Freq: Once | INTRAVENOUS | Status: DC
  Filled 2020-07-18: qty 250

## 2020-07-18 MED ORDER — ADULT MULTIVITAMIN W/MINERALS CH
1.0000 | ORAL_TABLET | Freq: Every day | ORAL | Status: DC
  Administered 2020-07-19 – 2020-07-24 (×5): 1 via ORAL
  Filled 2020-07-18 (×7): qty 1

## 2020-07-18 MED ORDER — SODIUM CHLORIDE 0.9 % IV BOLUS (SEPSIS)
1000.0000 mL | Freq: Once | INTRAVENOUS | Status: AC
  Administered 2020-07-18: 11:00:00 1000 mL via INTRAVENOUS

## 2020-07-18 MED ORDER — VANCOMYCIN HCL IN DEXTROSE 1-5 GM/200ML-% IV SOLN: 1000.0000 mg | Freq: Once | INTRAVENOUS | Status: DC

## 2020-07-18 MED ORDER — ONDANSETRON HCL 4 MG/2ML IJ SOLN: 4.0000 mg | Freq: Four times a day (QID) | INTRAMUSCULAR | Status: DC | PRN

## 2020-07-18 MED ORDER — ATORVASTATIN CALCIUM 20 MG PO TABS
20.0000 mg | ORAL_TABLET | Freq: Every day | ORAL | Status: DC
  Administered 2020-07-19 – 2020-07-24 (×5): 20 mg via ORAL
  Filled 2020-07-18 (×7): qty 1

## 2020-07-18 MED ORDER — ACETAMINOPHEN 650 MG RE SUPP: 650.0000 mg | Freq: Four times a day (QID) | RECTAL | Status: DC | PRN

## 2020-07-18 MED ORDER — DONEPEZIL HCL 5 MG PO TABS
10.0000 mg | ORAL_TABLET | Freq: Every day | ORAL | Status: DC
  Administered 2020-07-18 – 2020-07-23 (×4): 10 mg via ORAL
  Filled 2020-07-18 (×6): qty 2

## 2020-07-18 MED ORDER — SODIUM CHLORIDE 0.9 % IV SOLN
2.0000 g | Freq: Once | INTRAVENOUS | Status: AC
  Administered 2020-07-18: 11:00:00 2 g via INTRAVENOUS
  Filled 2020-07-18: qty 2

## 2020-07-18 MED ORDER — FINASTERIDE 5 MG PO TABS
5.0000 mg | ORAL_TABLET | Freq: Every day | ORAL | Status: DC
  Administered 2020-07-19 – 2020-07-24 (×5): 5 mg via ORAL
  Filled 2020-07-18 (×7): qty 1

## 2020-07-18 MED ORDER — ISOSORBIDE MONONITRATE ER 30 MG PO TB24
30.0000 mg | ORAL_TABLET | Freq: Every day | ORAL | Status: DC
  Administered 2020-07-19 – 2020-07-24 (×5): 30 mg via ORAL
  Filled 2020-07-18 (×7): qty 1

## 2020-07-18 NOTE — Progress Notes (Signed)
Patient here today for follow up, treatment consideration regarding hairy cell leukemia. Patient and son report cough, shortness of breath and fever for the last 3-4 days. Patient was seen at urgent care and started on antibiotics last night, covid test pending at this time.

## 2020-07-18 NOTE — ED Notes (Signed)
Per secure chat by Francine Graven, MD blood reaction protocol does not need to be initiated due to pt being febrile prior to arrival. Pt in NAD at this time. Will continue to monitor.

## 2020-07-18 NOTE — ED Triage Notes (Addendum)
Had blood work drawn this morning at the [facility]  Patient neutropenic and Hgb: 6.  Patient currently receiving chemo weekly treatment.

## 2020-07-18 NOTE — Consult Note (Signed)
PHARMACY -  BRIEF ANTIBIOTIC NOTE   Pharmacy has received consult(s) for vancomycin and cefepime from an ED provider.  The patient's profile has been reviewed for ht/wt/allergies/indication/available labs.    One time order(s) placed for: - cefepime 2g x1 - vancomycin 1.5g x1  Further antibiotics/pharmacy consults should be ordered by admitting physician if indicated.                       Thank you, Lorna Dibble 07/18/2020  10:36 AM

## 2020-07-18 NOTE — ED Notes (Signed)
[place], MD contacted via secure chat about pts temperature of 102.7. Pt in NAD at this time. All other vital signs stable. Awaiting further orders. Will continue to monitor.

## 2020-07-18 NOTE — ED Notes (Signed)
Son going home to get a few things, states will be back in about 3 hrs.

## 2020-07-18 NOTE — H&P (Addendum)
History and Physical    Frank Mcdaniel:[id number] DOB: 1932/12/17 DOA: 07/18/2020  PCP: Frank Haven, MD   Patient coming from: Home  I have personally briefly reviewed patient's old medical records in [place]  Chief Complaint: Cough                               Shortness of breath  HPI: Frank Mcdaniel is a 86 y.o. male with medical history significant for anxiety, GERD, hypertension, anemia, history of recurrent hairy cell leukemia status post bone marrow biopsy on 11/02/19 which revealed recurrent hairy cell leukemia.  Patient was started on Rituxan and Cladribine in April, 2021.  It appears he did not receive any further chemotherapy until November, 29, 2021 when he received a dose of Rituxan plus cladribine, cycle 1, day 1.  His son states that he ended up in the hospital after the initial treatment in March. About 2 days prior to presenting to the emergency room he developed a fever with a T-max of 101 F and a wet sounding cough which is productive of occasional clear phlegm and was seen at the urgent care center where he was started on antibiotics for presumed acute bronchitis.  Patient went to the cancer center on the day of his admission for chemotherapy infusion and labs showed neutropenia.  He was also febrile with a T-max of 100.7 F.  He was sent to the emergency room for further evaluation. He denies having any chest pain, no shortness of breath, no nausea, no vomiting no abdominal pain, no headache, no dizziness, no lightheadedness, no sore throat. Labs show sodium 134, potassium 4.3, chloride 104, bicarb 22, glucose 133, BUN 26, creatinine 1.34 calcium 8.4, alkaline phosphatase 47, albumin 3.3, AST 34, ALT 12, total protein 6.3, total bili 2.0, BNP 158, lactic acid 1.6, white cell count 0.7, hemoglobin 5.5, hematocrit 17, MCV 103, RDW 18.6, platelet count 71 Respiratory viral panel is negative Chest x ray reviewed by me shows mild bibasilar  atelectasis/infiltrate. Twelve-lead EKG shows sinus rhythm, LVH and right bundle branch block     ED Course: Patient is an 86 year old male sent from the cancer center to the emergency room for evaluation of a fever with a T-max of 100.7 F.  Patient has a history of recurrent hairy cell leukemia and was recently started back on chemotherapy.  He was scheduled for an infusion on the day of admission but was sent to the ER due to marked neutropenia and fever.  He received a dose of vancomycin and cefepime in the ER and will be admitted to the hospital for further evaluation.  Review of Systems: As per HPI otherwise 10 point review of systems negative.    Past Medical History:  Diagnosis Date  . Anemia 09/01/2015  . Anxiety 09/11/2015  . Arthritis   . Asthma    as a teenager  . Asymptomatic Sinus Bradycardia   . Atypical chest pain    a. 08/2014  . BPH (benign prostatic hyperplasia)   . CKD (chronic kidney disease), stage III ([place])   . Closed fracture of tuft of distal phalanx of finger 08/31/2018  . Coronary artery disease    a. 2000 s/p CABG;  b. 10/2008 Neg MV, EF 64%; c. cath 05/2015: LM 85% sev cal, pLAD-1 lesion 80%, pLAD-2 lesion 100% chronic, ostLCx 70% sev cal, OM4 90%, ostRCA 70%, mid RCA 90%, LIMA-LAD patent, VG-OM2 patent, VG-OM3  patent, VG-RPDA patent. No AS  . Dementia without behavioral disturbance ([place]) 05/28/2019  . GERD (gastroesophageal reflux disease)   . H/O echocardiogram    a. 10/2005 Echo: nl EF.  Frank Mcdaniel Hairy cell leukemia ([place])   . HOH (hard of hearing)    Bilateral hearing aids  . HTN (hypertension)   . Hyperlipidemia   . Hypertension   . Leukemia ([place])   . Neutropenic fever ([place]) 11/08/2019  . Stricture and stenosis of esophagus     Past Surgical History:  Procedure Laterality Date  . CARDIAC CATHETERIZATION N/A 05/26/2015   Procedure: Left Heart Cath;  Surgeon: [name], MD;  Location: [facility];  Service: Cardiovascular;  Laterality: N/A;   . CARDIAC CATHETERIZATION N/A 05/26/2015   Procedure: Coronary/Graft Angiography;  Surgeon: [name], MD;  Location: [facility];  Service: Cardiovascular;  Laterality: N/A;  . CATARACT EXTRACTION    . CORONARY ARTERY BYPASS GRAFT  2000  . ESOPHAGOGASTRODUODENOSCOPY N/A 02/18/2017   Procedure: ESOPHAGOGASTRODUODENOSCOPY (EGD) with removal of food bolus;  Surgeon: Frank Lame, MD;  Location: [facility] ENDOSCOPY;  Service: Endoscopy;  Laterality: N/A;  . ESOPHAGOGASTRODUODENOSCOPY (EGD) WITH PROPOFOL N/A 03/11/2017   Procedure: ESOPHAGOGASTRODUODENOSCOPY (EGD) WITH PROPOFOL;  Surgeon: Frank Lame, MD;  Location: [facility] ENDOSCOPY;  Service: Endoscopy;  Laterality: N/A;  . ESOPHAGOGASTRODUODENOSCOPY (EGD) WITH PROPOFOL N/A 04/15/2017   Procedure: ESOPHAGOGASTRODUODENOSCOPY (EGD) WITH PROPOFOL;  Surgeon: Frank Lame, MD;  Location: [facility] ENDOSCOPY;  Service: Endoscopy;  Laterality: N/A;  . EYE SURGERY Left    Cataract Extraction with IOL  . INGUINAL HERNIA REPAIR Left 06/24/2016   Procedure: HERNIA REPAIR INGUINAL ADULT;  Surgeon: Frank Bellow, MD;  Location: [facility] ORS;  Service: General;  Laterality: Left;  . TONSILLECTOMY  1942   . UPPER GASTROINTESTINAL ENDOSCOPY  3 years ago    with Dilation.      reports that he has never smoked. He has never used smokeless tobacco. He reports that he does not drink alcohol and does not use drugs.  No Known Allergies  Family History  Problem Relation Age of Onset  . Hypertension Mother   . Arthritis Father   . Other Other        no premature CAD.  Frank Mcdaniel Colon cancer Neg Hx   . Prostate cancer Neg Hx   . Stomach cancer Neg Hx      Prior to Admission medications   Medication Sig Start Date End Date Taking? Authorizing Provider  aspirin EC 81 MG tablet Take 81 mg by mouth daily.    [provider]  atorvastatin (LIPITOR) 40 MG tablet TAKE 1/2 TABLETS (20 MG TOTAL) BY MOUTH DAILY. 10/26/19   Frank Haven, MD  azithromycin  (ZITHROMAX Z-PAK) 250 MG tablet As written on box 07/17/20   Loura Halt A, NP  Calcium Carbonate Antacid (TUMS CHEWY BITES PO) Take 1 tablet by mouth as needed.     [provider]  CLINPRO 5000 1.1 % PSTE APPLY PEASIZED AMOUNT, BRUSH DAILY AT LEAST 2 MINUTES. SPIT AFTER USE 04/11/19   [provider]  donepezil (ARICEPT) 10 MG tablet Take 1 tablet (10 mg total) by mouth at bedtime. 06/07/20   Frank Haven, MD  finasteride (PROSCAR) 5 MG tablet TAKE 1 TABLET BY MOUTH EVERY DAY 07/17/20   Frank Haven, MD  FLUZONE HIGH-DOSE QUADRIVALENT 0.7 ML SUSY  05/02/20   [provider]  isosorbide mononitrate (IMDUR) 30 MG 24 hr tablet TAKE 1 TABLET  BY MOUTH EVERY DAY 08/31/19   [name], MD  Multiple Vitamin (MULTIVITAMIN) tablet Take 1 tablet by mouth daily.    [provider]  ondansetron (ZOFRAN) 8 MG tablet Take 1 tablet (8 mg total) by mouth 2 (two) times daily as needed (Nausea or vomiting). 10/18/19   [name], MD  prochlorperazine (COMPAZINE) 10 MG tablet Take 1 tablet (10 mg total) by mouth every 6 (six) hours as needed (Nausea or vomiting). 10/18/19   [name], MD  tamsulosin (FLOMAX) 0.4 MG CAPS capsule TAKE 2 CAPSULES BY MOUTH EVERY DAY 07/07/20   Frank Haven, MD    Physical Exam: Vitals:   07/18/20 1030 07/18/20 1100 07/18/20 1119 07/18/20 1200  BP: 102/88 106/78  128/62  Pulse: 60 65  (!) 123  Resp: (!) 23 (!) 29  (!) 23  Temp:   99.1 F (37.3 C)   TempSrc:      SpO2: 100% 100%  100%  Weight:      Height:         Vitals:   07/18/20 1030 07/18/20 1100 07/18/20 1119 07/18/20 1200  BP: 102/88 106/78  128/62  Pulse: 60 65  (!) 123  Resp: (!) 23 (!) 29  (!) 23  Temp:   99.1 F (37.3 C)   TempSrc:      SpO2: 100% 100%  100%  Weight:      Height:        Constitutional: NAD, alert and oriented x 3.  Chronically ill-appearing Eyes: PERRL, lids and conjunctivae pallor ENMT: Mucous membranes are moist.   Neck: normal, supple, no masses, no thyromegaly Respiratory: Scattered rhonchi bilaterally, no wheezing, no crackles. Normal respiratory effort. No accessory muscle use.  Cardiovascular: Regular rate and rhythm, no murmurs / rubs / gallops. No extremity edema. 2+ pedal pulses. No carotid bruits.  Abdomen: no tenderness, no masses palpated. No hepatosplenomegaly. Bowel sounds positive.  Musculoskeletal: no clubbing / cyanosis. No joint deformity upper and lower extremities.  Skin: no rashes, lesions, ulcers.  Neurologic: No gross focal neurologic deficit. Psychiatric: Normal mood and affect.   Labs on Admission: I have personally reviewed following labs and imaging studies  CBC: Recent Labs  Lab 07/18/20 0830 07/18/20 1007  WBC 0.8* 0.7*  NEUTROABS 0.7* 0.7*  HGB 6.2* 5.5*  HCT 18.8* 17.0*  MCV 102.7* 103.0*  PLT 83* 71*   Basic Metabolic Panel: Recent Labs  Lab 07/18/20 0830 07/18/20 1007  NA 134* 134*  K 3.6 4.3  CL 102 104  CO2 19* 22  GLUCOSE 153* 133*  BUN 24* 26*  CREATININE 1.43* 1.34*  CALCIUM 8.5* 8.4*   GFR: Estimated Creatinine Clearance: 38.4 mL/min (A) (by C-G formula based on SCr of 1.34 mg/dL (H)). Liver Function Tests: Recent Labs  Lab 07/18/20 1007  AST 34  ALT 12  ALKPHOS 47  BILITOT 2.0*  PROT 6.3*  ALBUMIN 3.3*   No results for input(s): LIPASE, AMYLASE in the last 168 hours. No results for input(s): AMMONIA in the last 168 hours. Coagulation Profile: No results for input(s): INR, PROTIME in the last 168 hours. Cardiac Enzymes: No results for input(s): CKTOTAL, CKMB, CKMBINDEX, TROPONINI in the last 168 hours. BNP (last 3 results) No results for input(s): PROBNP in the last 8760 hours. HbA1C: No results for input(s): HGBA1C in the last 72 hours. CBG: No results for input(s): GLUCAP in the last 168 hours. Lipid Profile: No results for input(s): CHOL, HDL, LDLCALC, TRIG, CHOLHDL, LDLDIRECT  in the last 72 hours. Thyroid Function  Tests: No results for input(s): TSH, T4TOTAL, FREET4, T3FREE, THYROIDAB in the last 72 hours. Anemia Panel: No results for input(s): VITAMINB12, FOLATE, FERRITIN, TIBC, IRON, RETICCTPCT in the last 72 hours. Urine analysis:    Component Value Date/Time   COLORURINE YELLOW (A) 11/08/2019 1643   APPEARANCEUR HAZY (A) 11/08/2019 1643   APPEARANCEUR Clear 07/16/2018 1452   LABSPEC 1.024 11/08/2019 1643   LABSPEC 1.013 09/01/2014 1142   PHURINE 5.0 11/08/2019 1643   GLUCOSEU NEGATIVE 11/08/2019 1643   GLUCOSEU Negative 09/01/2014 1142   HGBUR NEGATIVE 11/08/2019 1643   BILIRUBINUR NEGATIVE 11/08/2019 1643   BILIRUBINUR Negative 07/16/2018 1452   BILIRUBINUR Negative 09/01/2014 1142   KETONESUR NEGATIVE 11/08/2019 1643   PROTEINUR 30 (A) 11/08/2019 1643   UROBILINOGEN 0.2 12/21/2015 1132   NITRITE NEGATIVE 11/08/2019 1643   LEUKOCYTESUR NEGATIVE 11/08/2019 1643   LEUKOCYTESUR Negative 09/01/2014 1142    Radiological Exams on Admission: DG Chest 2 View  Result Date: 07/17/2020 CLINICAL DATA:  Cough, fever, shortness of breath. EXAM: CHEST - 2 VIEW COMPARISON:  Radiograph and CT 11/08/2019 FINDINGS: Post median sternotomy and CABG.The cardiomediastinal contours are normal. There is bronchial thickening. Pulmonary vasculature is normal. No consolidation, pleural effusion, or pneumothorax. Exaggerated thoracic kyphosis with multilevel degenerative change in the thoracic spine. No acute osseous abnormalities are seen. Please note the anterior aspect of the sternum is not included in the field of view on the lateral. IMPRESSION: 1. Bronchial thickening without pneumonia. 2. Post CABG. Electronically Signed   By: Keith Rake M.D.   On: 07/17/2020 17:03   DG Chest Port 1 View  Result Date: 07/18/2020 CLINICAL DATA:  Question sepsis EXAM: PORTABLE CHEST 1 VIEW COMPARISON:  07/17/2020 FINDINGS: Postop CABG.  Heart size normal.  Negative for heart failure. Mild bibasilar atelectasis has  developed since yesterday. No effusion. IMPRESSION: Interval development of mild bibasilar atelectasis/infiltrate. Electronically Signed   By: Franchot Gallo M.D.   On: 07/18/2020 10:38    EKG: Independently reviewed.  Sinus rhythm, LVH Right bundle branch block  Assessment/Plan Principal Problem:   Febrile neutropenia (HCC) Active Problems:   HTN (hypertension)   Hairy cell leukemia (HCC)   Anemia   Anxiety   Dementia without behavioral disturbance (HCC)   Pancytopenia (HCC)   CKD (chronic kidney disease), stage III (HCC)      Febrile neutropenia Patient sent to the hospital for evaluation of fever with a T-max of 100.7 F and neutropenia with a white cell count of 0.7 and ANC of about 600 Blood cultures have been sent Patient has a wet cough initially thought to be due to acute bronchitis with chest x-ray suggestive of possible infiltrates/atelectasis Continue empiric antibiotic therapy with cefepime Follow-up results of blood cultures We will request oncology consult    Pancytopenia Multifactorial and secondary to hairy cell leukemia as well as chemotherapy-induced We will transfuse 1 unit of packed RBC Monitor platelet count closely no indication for platelet transfusion at this time We will defer administration of GCSF to oncology    Recurrent hairy cell leukemia On chemotherapy Received a dose of Rituxan plus Cladribine, cycle 1, day 1 and was scheduled for another infusion on 07/18/20 but was referred to the emergency room for evaluation of fever and neutropenia. Follow-up with oncology as outpatient    Coronary artery disease Continue nitrates as much as blood pressure tolerates Hold aspirin for now due to thrombocytopenia Continue statins   BPH Continue Flomax and finasteride  Dementia Continue Aricept     Chronic kidney disease, stage III Renal function is stable We will monitor closely   DVT prophylaxis: SCD Code Status: DO NOT  RESUSCITATE Family Communication: Greater than 50% of time was spent discussing plan of care with patient's son at the bedside.  All questions and concerns have been addressed.  CODE STATUS was discussed and patient is a DO NOT RESUSCITATE per his son who is his healthcare power of attorney Disposition Plan: Back to previous home environment Consults called: Oncology    Collier Bullock MD [facility]     07/18/2020, 12:30 PM

## 2020-07-18 NOTE — ED Notes (Signed)
Ice packs placed on axilla and groin. Will continue to monitor.

## 2020-07-18 NOTE — ED Provider Notes (Signed)
Frank Mcdaniel    CSN: [id number] Arrival date & time: 07/17/20  1443      History   Chief Complaint Chief Complaint  Patient presents with  . Cough  . Nasal Congestion    HPI Frank Mcdaniel is a 86 y.o. male.   Patient is an 86 year old male with past medical history of anemia, anxiety, arthritis, asthma, CKD, CAD, dementia, GERD, hairy cell leukemia, hypertension, hyperlipidemia.  He presents today with his son.  Per son he has had cough, nasal congestion and shortness of breath for the past few days.  Symptoms been constant worsening.  Fever today of 101.9.  Has not had any medicines.  Per son he was outside doing yard work yesterday and felt pretty good.  No known Covid exposure.  Has been fully vaccinated with booster.  No urinary symptoms.  Patient is currently undergoing treatment for leukemia.  Is scheduled for treatment tomorrow at the cancer center.      Past Medical History:  Diagnosis Date  . Anemia 09/01/2015  . Anxiety 09/11/2015  . Arthritis   . Asthma    as a teenager  . Asymptomatic Sinus Bradycardia   . Atypical chest pain    a. 08/2014  . BPH (benign prostatic hyperplasia)   . CKD (chronic kidney disease), stage III ([place])   . Closed fracture of tuft of distal phalanx of finger 08/31/2018  . Coronary artery disease    a. 2000 s/p CABG;  b. 10/2008 Neg MV, EF 64%; c. cath 05/2015: LM 85% sev cal, pLAD-1 lesion 80%, pLAD-2 lesion 100% chronic, ostLCx 70% sev cal, OM4 90%, ostRCA 70%, mid RCA 90%, LIMA-LAD patent, VG-OM2 patent, VG-OM3 patent, VG-RPDA patent. No AS  . Dementia without behavioral disturbance ([place]) 05/28/2019  . GERD (gastroesophageal reflux disease)   . H/O echocardiogram    a. 10/2005 Echo: nl EF.  [name] Hairy cell leukemia ([place])   . HOH (hard of hearing)    Bilateral hearing aids  . HTN (hypertension)   . Hyperlipidemia   . Hypertension   . Leukemia ([place])   . Neutropenic fever ([place]) 11/08/2019  . Stricture and stenosis of esophagus      Patient Active Problem List   Diagnosis Date Noted  . CKD (chronic kidney disease), stage III ([place]) 02/28/2020  . Pancytopenia ([place]) 01/07/2020  . C. difficile diarrhea 12/31/2019  . BRBPR (bright red blood per rectum) 12/31/2019  . Abnormal EKG 11/23/2019  . IBS (irritable bowel syndrome) 08/02/2019  . Dementia without behavioral disturbance ([place]) 05/28/2019  . Status post inguinal hernia repair 09/13/2016  . Constipation 05/21/2016  . Memory loss or impairment 02/15/2016  . Chronic fatigue 12/21/2015  . Bilateral low back pain without sciatica 12/21/2015  . Anxiety 09/11/2015  . Anemia 09/01/2015  . GERD (gastroesophageal reflux disease) 06/02/2015  . Asthma 01/28/2015  . BPH (benign prostatic hyperplasia) 01/28/2015  . Hairy cell leukemia ([place]) 01/28/2015  . Coronary artery disease   . S/P CABG (coronary artery bypass graft) 11/08/2010  . HTN (hypertension) 11/08/2010  . Bradycardia 11/08/2010  . Hyperlipidemia 10/27/2009    Past Surgical History:  Procedure Laterality Date  . CARDIAC CATHETERIZATION N/A 05/26/2015   Procedure: Left Heart Cath;  Surgeon: [name], MD;  Location: [facility];  Service: Cardiovascular;  Laterality: N/A;  . CARDIAC CATHETERIZATION N/A 05/26/2015   Procedure: Coronary/Graft Angiography;  Surgeon: [name], MD;  Location: [facility];  Service: Cardiovascular;  Laterality: N/A;  .  CATARACT EXTRACTION    . CORONARY ARTERY BYPASS GRAFT  2000  . ESOPHAGOGASTRODUODENOSCOPY N/A 02/18/2017   Procedure: ESOPHAGOGASTRODUODENOSCOPY (EGD) with removal of food bolus;  Surgeon: [name], MD;  Location: [facility] ENDOSCOPY;  Service: Endoscopy;  Laterality: N/A;  . ESOPHAGOGASTRODUODENOSCOPY (EGD) WITH PROPOFOL N/A 03/11/2017   Procedure: ESOPHAGOGASTRODUODENOSCOPY (EGD) WITH PROPOFOL;  Surgeon: [name], MD;  Location: [facility] ENDOSCOPY;  Service: Endoscopy;  Laterality: N/A;  . ESOPHAGOGASTRODUODENOSCOPY (EGD) WITH  PROPOFOL N/A 04/15/2017   Procedure: ESOPHAGOGASTRODUODENOSCOPY (EGD) WITH PROPOFOL;  Surgeon: [name], MD;  Location: [facility] ENDOSCOPY;  Service: Endoscopy;  Laterality: N/A;  . EYE SURGERY Left    Cataract Extraction with IOL  . INGUINAL HERNIA REPAIR Left 06/24/2016   Procedure: HERNIA REPAIR INGUINAL ADULT;  Surgeon: [name], MD;  Location: [facility] ORS;  Service: General;  Laterality: Left;  . TONSILLECTOMY  1942   . UPPER GASTROINTESTINAL ENDOSCOPY  3 years ago    with Dilation.        Home Medications    Prior to Admission medications   Medication Sig Start Date End Date Taking? Authorizing Provider  aspirin EC 81 MG tablet Take 81 mg by mouth daily.    [provider]  atorvastatin (LIPITOR) 40 MG tablet TAKE 1/2 TABLETS (20 MG TOTAL) BY MOUTH DAILY. 10/26/19   [name], MD  azithromycin (ZITHROMAX Z-PAK) 250 MG tablet As written on box 07/17/20   Loura Halt A, NP  Calcium Carbonate Antacid (TUMS CHEWY BITES PO) Take 1 tablet by mouth as needed.     [provider]  CLINPRO 5000 1.1 % PSTE APPLY PEASIZED AMOUNT, BRUSH DAILY AT LEAST 2 MINUTES. SPIT AFTER USE 04/11/19   [provider]  donepezil (ARICEPT) 10 MG tablet Take 1 tablet (10 mg total) by mouth at bedtime. 06/07/20   [name], MD  finasteride (PROSCAR) 5 MG tablet TAKE 1 TABLET BY MOUTH EVERY DAY 07/17/20   [name], MD  FLUZONE HIGH-DOSE QUADRIVALENT 0.7 ML SUSY  05/02/20   [provider]  isosorbide mononitrate (IMDUR) 30 MG 24 hr tablet TAKE 1 TABLET BY MOUTH EVERY DAY 08/31/19   [name], MD  Multiple Vitamin (MULTIVITAMIN) tablet Take 1 tablet by mouth daily.      [provider]  ondansetron (ZOFRAN) 8 MG tablet Take 1 tablet (8 mg total) by mouth 2 (two) times daily as needed (Nausea or vomiting). 10/18/19   [name], MD  prochlorperazine (COMPAZINE) 10 MG tablet Take 1 tablet (10 mg total) by mouth every 6 (six)  hours as needed (Nausea or vomiting). 10/18/19   [name], MD  tamsulosin (FLOMAX) 0.4 MG CAPS capsule TAKE 2 CAPSULES BY MOUTH EVERY DAY 07/07/20   [name], MD    Family History Family History  Problem Relation Age of Onset  . Hypertension Mother   . Arthritis Father   . Other Other        no premature CAD.  [name] Colon cancer Neg Hx   . Prostate cancer Neg Hx   . Stomach cancer Neg Hx     Social History Social History   Tobacco Use  . Smoking status: Never Smoker  . Smokeless tobacco: Never Used  Vaping Use  . Vaping Use: Never used  Substance Use Topics  . Alcohol use: No  . Drug use: No     Allergies   Patient has no known allergies.   Review of Systems Review of  Systems   Physical Exam Triage Vital Signs ED Triage Vitals  Enc Vitals Group     BP 07/17/20 1632 (!) 148/68     Pulse Rate 07/17/20 1632 89     Resp 07/17/20 1632 (!) 28     Temp 07/17/20 1632 (!) 101.9 F (38.8 C)     Temp Source 07/17/20 1632 Oral     SpO2 07/17/20 1632 100 %     Weight 07/17/20 1628 165 lb (74.8 kg)     Height 07/17/20 1628 5\' 5"  (1.651 m)     Head Circumference --      Peak Flow --      Pain Score 07/17/20 1628 0     Pain Loc --      Pain Edu? --      Excl. in [place]? --    No data found.  Updated Vital Signs BP (!) 148/68 (BP Location: Left Arm)   Pulse 89   Temp (!) 101.9 F (38.8 C) (Oral)   Resp (!) 28   Ht 5\' 5"  (1.651 m)   Wt 165 lb (74.8 kg)   SpO2 100%   BMI 27.46 kg/m   Visual Acuity Right Eye Distance:   Left Eye Distance:   Bilateral Distance:    Right Eye Near:   Left Eye Near:    Bilateral Near:     Physical Exam Vitals and nursing note reviewed.  Constitutional:      General: He is not in acute distress.    Appearance: Normal appearance. He is ill-appearing. He is not toxic-appearing or diaphoretic.  HENT:     Head: Normocephalic and atraumatic.     Right Ear: Tympanic membrane and ear canal normal.     Left Ear:  Tympanic membrane and ear canal normal.     Nose: Congestion present.     Mouth/Throat:     Pharynx: Oropharynx is clear.  Eyes:     Conjunctiva/sclera: Conjunctivae normal.  Cardiovascular:     Rate and Rhythm: Normal rate and regular rhythm.  Pulmonary:     Effort: Pulmonary effort is normal.     Breath sounds: Normal breath sounds.     Comments: Harsh cough  Musculoskeletal:        General: Normal range of motion.     Cervical back: Normal range of motion.  Skin:    General: Skin is warm and dry.  Neurological:     Mental Status: He is alert. Mental status is at baseline.  Psychiatric:        Mood and Affect: Mood normal.      UC Treatments / Results  Labs (all labs ordered are listed, but only abnormal results are displayed) Labs Reviewed  COVID-19, FLU A+B AND RSV    EKG   Radiology DG Chest 2 View  Result Date: 07/17/2020 CLINICAL DATA:  Cough, fever, shortness of breath. EXAM: CHEST - 2 VIEW COMPARISON:  Radiograph and CT 11/08/2019 FINDINGS: Post median sternotomy and CABG.The cardiomediastinal contours are normal. There is bronchial thickening. Pulmonary vasculature is normal. No consolidation, pleural effusion, or pneumothorax. Exaggerated thoracic kyphosis with multilevel degenerative change in the thoracic spine. No acute osseous abnormalities are seen. Please note the anterior aspect of the sternum is not included in the field of view on the lateral. IMPRESSION: 1. Bronchial thickening without pneumonia. 2. Post CABG. Electronically Signed   By: Keith Rake M.D.   On: 07/17/2020 17:03    Procedures Procedures (including critical care time)  Medications Ordered in UC Medications  acetaminophen (TYLENOL) tablet 650 mg (650 mg Oral Given 07/17/20 1642)    Initial Impression / Assessment and Plan / UC Course  I have reviewed the triage vital signs and the nursing notes.  Pertinent labs & imaging results that were available during my care of the  patient were reviewed by me and considered in my medical decision making (see chart for details).     Acute bronchitis Unsure whether there is underlying bacterial infection versus viral Covid swab and flu swab pending. X-ray without any concerns Tylenol given here for fever. Not highly concerned for sepsis at this time. We will go ahead and prescribe Z-Pak for possible underlying infection. Strict ER in return precautions given Son understanding and agree to plan.  Final Clinical Impressions(s) / UC Diagnoses   Final diagnoses:  Acute bronchitis, unspecified organism     Discharge Instructions     Take the antibiotics as prescribed. Flu and Covid test pending. Tylenol for fever as needed.  Mucinex for cough and congestion Make sure to rest and drink plenty of fluids Follow up as needed for continued or worsening symptoms     ED Prescriptions    Medication Sig Dispense Auth. Provider   azithromycin (ZITHROMAX Z-PAK) 250 MG tablet As written on box 6 tablet Graysen Depaula A, NP     PDMP not reviewed this encounter.   Orvan July, NP 07/18/20 [phone]

## 2020-07-18 NOTE — ED Notes (Signed)
Took over care of pt. Pt resting comfortably. Pt given blanket for comfort. Pt in NAD at this time. VSS. Awaiting further orders. Will continue to monitor.

## 2020-07-18 NOTE — Progress Notes (Signed)
PHARMACY NOTE:  ANTIMICROBIAL RENAL DOSAGE ADJUSTMENT  Current antimicrobial regimen includes a mismatch between antimicrobial dosage and estimated renal function.  As per policy approved by the Pharmacy & Therapeutics and Medical Executive Committees, the antimicrobial dosage will be adjusted accordingly.  Current antimicrobial dosage:  Cefepime 2g IV every 8 hours   Indication: Febrile Neutropenia   Renal Function:  Estimated Creatinine Clearance: 38.4 mL/min (A) (by C-G formula based on SCr of 1.34 mg/dL (H)).    Antimicrobial dosage has been changed to:  Cefepime 2g IV every 12 hours    Thank you for allowing pharmacy to be a part of this patient's care.  Pernell Dupre, PharmD, BCPS Clinical Pharmacist 07/18/2020 12:39 PM

## 2020-07-18 NOTE — ED Notes (Signed)
Admitting physician at bedside

## 2020-07-18 NOTE — Progress Notes (Signed)
[place]  [phone] [phone]  ID: Frank Mcdaniel OB: [birth year]  MR#: [id number]  CHE#:[id number]  Patient Care Team: [name], MD as PCP - General (Family Medicine) Rockey Situ Kathlene November, MD as PCP - Cardiology (Cardiology) [name], MD as Consulting Physician (Cardiology) [name], MD as Consulting Physician (Oncology) [name] as Physician Assistant (Urology)  CHIEF COMPLAINT: Recurrent hairy cell leukemia.  INTERVAL HISTORY: Patient returns to clinic today for further evaluation prior to cycle 3 single agent Rituxan.  He has done remarkably well with recent treatments.  He unfortunately developed a fever yesterday along with a cough and sputum production.  He was taken to urgent care for work-up.  Work-up included a chest x-ray which was negative and a COVID-19 swab which was pending.  He was sent home with a Z-Pak.  Patient reports today stating he feels "okay".  He was given a dose of Tylenol last night and took additional Tylenol this morning.  He developed a cough a couple of days ago which has worsened.  He has some wheezing.  Denies any real sputum production.  Noted to have a temperature at urgent care.  Continues to have chronic weakness and fatigue. He has no new neurologic complaints.  He denies any recent fevers or illnesses.  He denies any chest pain, shortness of breath, cough, or hemoptysis.  He denies nausea, vomiting, diarrhea or constipation.  He has no urinary complaints.  Patient offers no further specific complaints today.  REVIEW OF SYSTEMS:   Review of Systems  Constitutional: Positive for fever and malaise/fatigue. Negative for weight loss.  Respiratory: Positive for cough, shortness of breath and wheezing. Negative for hemoptysis.   Cardiovascular: Negative.  Negative for chest pain and leg swelling.  Gastrointestinal: Negative.  Negative for abdominal pain.  Genitourinary:  Negative.  Negative for dysuria.  Musculoskeletal: Negative.  Negative for back pain.  Skin: Negative.  Negative for rash.  Neurological: Positive for weakness. Negative for dizziness, focal weakness and headaches.  Psychiatric/Behavioral: Positive for memory loss. The patient is not nervous/anxious.     As per HPI. Otherwise, a complete review of systems is negative.  PAST MEDICAL HISTORY: Past Medical History:  Diagnosis Date  . Anemia 09/01/2015  . Anxiety 09/11/2015  . Arthritis   . Asthma    as a teenager  . Asymptomatic Sinus Bradycardia   . Atypical chest pain    a. 08/2014  . BPH (benign prostatic hyperplasia)   . CKD (chronic kidney disease), stage III ([place])   . Closed fracture of tuft of distal phalanx of finger 08/31/2018  . Coronary artery disease    a. 2000 s/p CABG;  b. 10/2008 Neg MV, EF 64%; c. cath 05/2015: LM 85% sev cal, pLAD-1 lesion 80%, pLAD-2 lesion 100% chronic, ostLCx 70% sev cal, OM4 90%, ostRCA 70%, mid RCA 90%, LIMA-LAD patent, VG-OM2 patent, VG-OM3 patent, VG-RPDA patent. No AS  . Dementia without behavioral disturbance ([place]) 05/28/2019  . GERD (gastroesophageal reflux disease)   . H/O echocardiogram    a. 10/2005 Echo: nl EF.  [name] Hairy cell leukemia ([place])   . HOH (hard of hearing)    Bilateral hearing aids  . HTN (hypertension)   . Hyperlipidemia   . Hypertension   . Leukemia ([place])   . Neutropenic fever ([place]) 11/08/2019  . Stricture and stenosis of esophagus     PAST SURGICAL HISTORY: Past Surgical History:  Procedure Laterality Date  . CARDIAC  CATHETERIZATION N/A 05/26/2015   Procedure: Left Heart Cath;  Surgeon: [name], MD;  Location: [facility];  Service: Cardiovascular;  Laterality: N/A;  . CARDIAC CATHETERIZATION N/A 05/26/2015   Procedure: Coronary/Graft Angiography;  Surgeon: [name], MD;  Location: [facility];  Service: Cardiovascular;  Laterality: N/A;  . CATARACT EXTRACTION    . CORONARY ARTERY BYPASS  GRAFT  2000  . ESOPHAGOGASTRODUODENOSCOPY N/A 02/18/2017   Procedure: ESOPHAGOGASTRODUODENOSCOPY (EGD) with removal of food bolus;  Surgeon: [name], MD;  Location: [facility] ENDOSCOPY;  Service: Endoscopy;  Laterality: N/A;  . ESOPHAGOGASTRODUODENOSCOPY (EGD) WITH PROPOFOL N/A 03/11/2017   Procedure: ESOPHAGOGASTRODUODENOSCOPY (EGD) WITH PROPOFOL;  Surgeon: [name], MD;  Location: [facility] ENDOSCOPY;  Service: Endoscopy;  Laterality: N/A;  . ESOPHAGOGASTRODUODENOSCOPY (EGD) WITH PROPOFOL N/A 04/15/2017   Procedure: ESOPHAGOGASTRODUODENOSCOPY (EGD) WITH PROPOFOL;  Surgeon: [name], MD;  Location: [facility] ENDOSCOPY;  Service: Endoscopy;  Laterality: N/A;  . EYE SURGERY Left    Cataract Extraction with IOL  . INGUINAL HERNIA REPAIR Left 06/24/2016   Procedure: HERNIA REPAIR INGUINAL ADULT;  Surgeon: [name], MD;  Location: [facility] ORS;  Service: General;  Laterality: Left;  . TONSILLECTOMY  1942   . UPPER GASTROINTESTINAL ENDOSCOPY  3 years ago    with Dilation.     FAMILY HISTORY Family History  Problem Relation Age of Onset  . Hypertension Mother   . Arthritis Father   . Other Other        no premature CAD.  [name] Colon cancer Neg Hx   . Prostate cancer Neg Hx   . Stomach cancer Neg Hx        ADVANCED DIRECTIVES:    HEALTH MAINTENANCE: Social History   Tobacco Use  . Smoking status: Never Smoker  . Smokeless tobacco: Never Used  Vaping Use  . Vaping Use: Never used  Substance Use Topics  . Alcohol use: No  . Drug use: No     No Known Allergies  No current facility-administered medications for this visit.   Current Outpatient Medications  Medication Sig Dispense Refill  . aspirin EC 81 MG tablet Take 81 mg by mouth daily.    [name] atorvastatin (LIPITOR) 40 MG tablet TAKE 1/2 TABLETS (20 MG TOTAL) BY MOUTH DAILY. 45 tablet 3  . azithromycin (ZITHROMAX Z-PAK) 250 MG tablet As written on box 6 tablet 0  . Calcium Carbonate Antacid (TUMS CHEWY BITES PO) Take 1 tablet  by mouth as needed.     [name] CLINPRO 5000 1.1 % PSTE APPLY PEASIZED AMOUNT, BRUSH DAILY AT LEAST 2 MINUTES. SPIT AFTER USE    . donepezil (ARICEPT) 10 MG tablet Take 1 tablet (10 mg total) by mouth at bedtime. 90 tablet 1  . finasteride (PROSCAR) 5 MG tablet TAKE 1 TABLET BY MOUTH EVERY DAY 30 tablet 1  . FLUZONE HIGH-DOSE QUADRIVALENT 0.7 ML SUSY     . isosorbide mononitrate (IMDUR) 30 MG 24 hr tablet TAKE 1 TABLET BY MOUTH EVERY DAY 90 tablet 3  . Multiple Vitamin (MULTIVITAMIN) tablet Take 1 tablet by mouth daily.    . ondansetron (ZOFRAN) 8 MG tablet Take 1 tablet (8 mg total) by mouth 2 (two) times daily as needed (Nausea or vomiting). 30 tablet 1  . prochlorperazine (COMPAZINE) 10 MG tablet Take 1 tablet (10 mg total) by mouth every 6 (six) hours as needed (Nausea or vomiting). 60 tablet 2  . tamsulosin (FLOMAX) 0.4 MG CAPS capsule TAKE 2 CAPSULES BY MOUTH  EVERY DAY 180 capsule 1   Facility-Administered Medications Ordered in Other Visits  Medication Dose Route Frequency Provider Last Rate Last Admin  . 0.9 %  sodium chloride infusion (Manually program via Guardrails IV Fluids)   Intravenous Once Agbata, Tochukwu, MD      . 0.9 %  sodium chloride infusion   Intravenous Continuous Agbata, Tochukwu, MD      . acetaminophen (TYLENOL) tablet 650 mg  650 mg Oral Q6H PRN Agbata, Tochukwu, MD       Or  . acetaminophen (TYLENOL) suppository 650 mg  650 mg Rectal Q6H PRN Agbata, Tochukwu, MD      . atorvastatin (LIPITOR) tablet 20 mg  20 mg Oral Daily Agbata, Tochukwu, MD      . ceFEPIme (MAXIPIME) 2 g in sodium chloride 0.9 % 100 mL IVPB  2 g Intravenous Q8H Agbata, Tochukwu, MD      . donepezil (ARICEPT) tablet 10 mg  10 mg Oral QHS Agbata, Tochukwu, MD      . finasteride (PROSCAR) tablet 5 mg  5 mg Oral Daily Agbata, Tochukwu, MD      . isosorbide mononitrate (IMDUR) 24 hr tablet 30 mg  30 mg Oral Daily Agbata, Tochukwu, MD      . multivitamin with minerals tablet 1 tablet  1 tablet Oral Daily  Agbata, Tochukwu, MD      . ondansetron (ZOFRAN) tablet 4 mg  4 mg Oral Q6H PRN Agbata, Tochukwu, MD       Or  . ondansetron (ZOFRAN) injection 4 mg  4 mg Intravenous Q6H PRN Agbata, Tochukwu, MD      . tamsulosin (FLOMAX) capsule 0.8 mg  0.8 mg Oral Daily Agbata, Tochukwu, MD      . vancomycin (VANCOREADY) IVPB 1500 mg/300 mL  1,500 mg Intravenous Once Lorna Dibble, RPH 150 mL/hr at 07/18/20 1159 1,500 mg at 07/18/20 1159    OBJECTIVE: Vitals:   07/18/20 0849  BP: (!) 99/55  Pulse: 90  Resp: 20  Temp: (!) 100.4 F (38 C)  SpO2: 100%     Body mass index is 27.92 kg/m.    ECOG FS:1 - Symptomatic but completely ambulatory  Physical Exam Constitutional:      Appearance: Normal appearance.  HENT:     Head: Normocephalic and atraumatic.  Eyes:     Pupils: Pupils are equal, round, and reactive to light.  Cardiovascular:     Rate and Rhythm: Normal rate and regular rhythm.     Heart sounds: Normal heart sounds. No murmur heard.   Pulmonary:     Effort: Pulmonary effort is normal.     Breath sounds: Normal breath sounds. No wheezing.  Abdominal:     General: Bowel sounds are normal. There is no distension.     Palpations: Abdomen is soft.     Tenderness: There is no abdominal tenderness.  Musculoskeletal:        General: Normal range of motion.     Cervical back: Normal range of motion.  Skin:    General: Skin is warm and dry.     Findings: No rash.  Neurological:     Mental Status: He is alert and oriented to person, place, and time.  Psychiatric:        Judgment: Judgment normal.     LAB RESULTS:  Lab Results  Component Value Date   NA 134 (L) 07/18/2020   K 4.3 07/18/2020   CL 104 07/18/2020   CO2 22 07/18/2020  GLUCOSE 133 (H) 07/18/2020   BUN 26 (H) 07/18/2020   CREATININE 1.34 (H) 07/18/2020   CALCIUM 8.4 (L) 07/18/2020   PROT 6.3 (L) 07/18/2020   ALBUMIN 3.3 (L) 07/18/2020   AST 34 07/18/2020   ALT 12 07/18/2020   ALKPHOS 47 07/18/2020    BILITOT 2.0 (H) 07/18/2020   GFRNONAA 52 (L) 07/18/2020   GFRAA 50 (L) 02/08/2020    Lab Results  Component Value Date   WBC 0.7 (LL) 07/18/2020   NEUTROABS 0.7 (L) 07/18/2020   HGB 5.5 (L) 07/18/2020   HCT 17.0 (L) 07/18/2020   MCV 103.0 (H) 07/18/2020   PLT 71 (L) 07/18/2020     STUDIES: DG Chest 2 View  Result Date: 07/17/2020 CLINICAL DATA:  Cough, fever, shortness of breath. EXAM: CHEST - 2 VIEW COMPARISON:  Radiograph and CT 11/08/2019 FINDINGS: Post median sternotomy and CABG.The cardiomediastinal contours are normal. There is bronchial thickening. Pulmonary vasculature is normal. No consolidation, pleural effusion, or pneumothorax. Exaggerated thoracic kyphosis with multilevel degenerative change in the thoracic spine. No acute osseous abnormalities are seen. Please note the anterior aspect of the sternum is not included in the field of view on the lateral. IMPRESSION: 1. Bronchial thickening without pneumonia. 2. Post CABG. Electronically Signed   By: Keith Rake M.D.   On: 07/17/2020 17:03   DG Chest Port 1 View  Result Date: 07/18/2020 CLINICAL DATA:  Question sepsis EXAM: PORTABLE CHEST 1 VIEW COMPARISON:  07/17/2020 FINDINGS: Postop CABG.  Heart size normal.  Negative for heart failure. Mild bibasilar atelectasis has developed since yesterday. No effusion. IMPRESSION: Interval development of mild bibasilar atelectasis/infiltrate. Electronically Signed   By: Franchot Gallo M.D.   On: 07/18/2020 10:38      ONCOLOGY HISTORY:  Patient initially received cladribine in 2006 and then was treated for a relapse in 2011 and again in 2014. Bone marrow biopsy at that time confirmed recurrence of disease with 80-90% hairy cell leukemia noted in his specimen. Patient completed cladribine 0.15 mg/kg on days 1 through 5 along with 8 weekly cycles of Rituxan on April 18, 2015. Bone marrow biopsy results after completion of 8 weeks of Rituxan without evidence of residual disease.   Patient typically has no evidence of disease and peripheral blood.   ASSESSMENT: Recurrent hairy cell leukemia.  PLAN:   1.  Recurrent hairy cell leukemia:  -Bone marrow from 11/02/2019 revealed recurrent hairy cell leukemia. -Received day 1 of Rituxan and cladribine in April 2021. -Treatment complicated by admission to the hospital for infection of unclear source. -Lab work continues to show persistent pancytopenia likely indicating progression of disease. -Plan is to repeat previous treatment with Rituxan on day 1+ cladribine D1-D5.  -Received single agent Rituxan last week. -Developed fever, cough and congestion over the past 3 days.  -Was started on Z-Pak by urgent care yesterday. -Labs from today show creatinine 1.34, white count 0.7, hemoglobin 5.5, platelet count 71,000 and ANC of 700. -Will hold treatment today. -Patient will be taken to the emergency room for further work-up of neutropenic fever.  2. Thrombocytopenia:  -Secondary to treatment and disease. -Platelet count 83,000 today.   -Chronic and unchanged.   3.  Neutropenic fever: -Secondary to treatment and disease -ANC 700, WBC 0.7 -Temp 100.4. -Mildly hypotensive.  4.  Anemia:  -Secondary to treatment and disease -Receives intermittent blood transfusions.  -Hemoglobin today is 6.2. -He will need blood transfusion but given pending Covid test, patient will likely need to be seen in the  emergency room.     5.  Cardiac disease:  -Continue follow-up and treatment per Dr. Rockey Situ.  6.  Renal insufficiency:  -Patient's creatinine is 1.83.   Disposition: -Report directly to the emergency room for work-up. -RTC as scheduled next week for reevaluation of next cycle.  Patient expressed understanding and was in agreement with this plan. He also understands that He can call clinic at any time with any questions, concerns, or complaints.   Greater than 50% was spent in counseling and coordination of care with this  patient including but not limited to discussion of the relevant topics above (See A&P) including, but not limited to diagnosis and management of acute and chronic medical conditions.   Jacquelin Hawking, NP 07/18/20 12:39 PM

## 2020-07-18 NOTE — ED Provider Notes (Signed)
[facility] Emergency Department Provider Note  Time seen: 10:16 AM  I have reviewed the triage vital signs and the nursing notes.   HISTORY  Chief Complaint Neutropenia   HPI Frank Mcdaniel is a 86 y.o. male with a past medical history of anemia, anxiety, CKD, gastric reflux, hypertension, hyperlipidemia, hairy cell leukemia currently on chemotherapy presents to the emergency department for fever and a low white blood cell count.  According to the patient and son patient is currently undergoing chemotherapy over the past 2 weeks, last of which was 7 days ago.  Per son patient has developed a wet sounding cough over the past 3 or 4 days.  They went to an urgent care last night for the cough and was diagnosed with bronchitis and prescribed Zithromax.  Patient had a fever yesterday per son to 100.0.  Patient was seen at oncology this morning as he had an infusion scheduled for today and was found to be febrile and lab results showed neutropenia and the patient was sent to the emergency department.  Patient has a largely negative review of systems otherwise.  Denies any shortness of breath, chest pain, abdominal pain, nausea vomiting or diarrhea.   Past Medical History:  Diagnosis Date  . Anemia 09/01/2015  . Anxiety 09/11/2015  . Arthritis   . Asthma    as a teenager  . Asymptomatic Sinus Bradycardia   . Atypical chest pain    a. 08/2014  . BPH (benign prostatic hyperplasia)   . CKD (chronic kidney disease), stage III ([place])   . Closed fracture of tuft of distal phalanx of finger 08/31/2018  . Coronary artery disease    a. 2000 s/p CABG;  b. 10/2008 Neg MV, EF 64%; c. cath 05/2015: LM 85% sev cal, pLAD-1 lesion 80%, pLAD-2 lesion 100% chronic, ostLCx 70% sev cal, OM4 90%, ostRCA 70%, mid RCA 90%, LIMA-LAD patent, VG-OM2 patent, VG-OM3 patent, VG-RPDA patent. No AS  . Dementia without behavioral disturbance ([place]) 05/28/2019  . GERD (gastroesophageal reflux disease)   .  H/O echocardiogram    a. 10/2005 Echo: nl EF.  [name] Hairy cell leukemia ([place])   . HOH (hard of hearing)    Bilateral hearing aids  . HTN (hypertension)   . Hyperlipidemia   . Hypertension   . Leukemia ([place])   . Neutropenic fever ([place]) 11/08/2019  . Stricture and stenosis of esophagus     Patient Active Problem List   Diagnosis Date Noted  . CKD (chronic kidney disease), stage III ([place]) 02/28/2020  . Pancytopenia ([place]) 01/07/2020  . C. difficile diarrhea 12/31/2019  . BRBPR (bright red blood per rectum) 12/31/2019  . Abnormal EKG 11/23/2019  . IBS (irritable bowel syndrome) 08/02/2019  . Dementia without behavioral disturbance ([place]) 05/28/2019  . Status post inguinal hernia repair 09/13/2016  . Constipation 05/21/2016  . Memory loss or impairment 02/15/2016  . Chronic fatigue 12/21/2015  . Bilateral low back pain without sciatica 12/21/2015  . Anxiety 09/11/2015  . Anemia 09/01/2015  . GERD (gastroesophageal reflux disease) 06/02/2015  . Asthma 01/28/2015  . BPH (benign prostatic hyperplasia) 01/28/2015  . Hairy cell leukemia ([place]) 01/28/2015  . Coronary artery disease   . S/P CABG (coronary artery bypass graft) 11/08/2010  . HTN (hypertension) 11/08/2010  . Bradycardia 11/08/2010  . Hyperlipidemia 10/27/2009    Past Surgical History:  Procedure Laterality Date  . CARDIAC CATHETERIZATION N/A 05/26/2015   Procedure: Left Heart Cath;  Surgeon: [name], MD;  Location:  [facility] [facility];  Service: Cardiovascular;  Laterality: N/A;  . CARDIAC CATHETERIZATION N/A 05/26/2015   Procedure: Coronary/Graft Angiography;  Surgeon: [name], MD;  Location: [place] [facility];  Service: Cardiovascular;  Laterality: N/A;  . CATARACT EXTRACTION    . CORONARY ARTERY BYPASS GRAFT  2000  . ESOPHAGOGASTRODUODENOSCOPY N/A 02/18/2017   Procedure: ESOPHAGOGASTRODUODENOSCOPY (EGD) with removal of food bolus;  Surgeon: [name], MD;  Location: [facility] ENDOSCOPY;  Service:  Endoscopy;  Laterality: N/A;  . ESOPHAGOGASTRODUODENOSCOPY (EGD) WITH PROPOFOL N/A 03/11/2017   Procedure: ESOPHAGOGASTRODUODENOSCOPY (EGD) WITH PROPOFOL;  Surgeon: [name], MD;  Location: [facility] ENDOSCOPY;  Service: Endoscopy;  Laterality: N/A;  . ESOPHAGOGASTRODUODENOSCOPY (EGD) WITH PROPOFOL N/A 04/15/2017   Procedure: ESOPHAGOGASTRODUODENOSCOPY (EGD) WITH PROPOFOL;  Surgeon: [name], MD;  Location: [facility] ENDOSCOPY;  Service: Endoscopy;  Laterality: N/A;  . EYE SURGERY Left    Cataract Extraction with IOL  . INGUINAL HERNIA REPAIR Left 06/24/2016   Procedure: HERNIA REPAIR INGUINAL ADULT;  Surgeon: [name], MD;  Location: [facility] ORS;  Service: General;  Laterality: Left;  . TONSILLECTOMY  1942   . UPPER GASTROINTESTINAL ENDOSCOPY  3 years ago    with Dilation.     Prior to Admission medications   Medication Sig Start Date End Date Taking? Authorizing Provider  aspirin EC 81 MG tablet Take 81 mg by mouth daily.    [provider]  atorvastatin (LIPITOR) 40 MG tablet TAKE 1/2 TABLETS (20 MG TOTAL) BY MOUTH DAILY. 10/26/19   [name], MD  azithromycin (ZITHROMAX Z-PAK) 250 MG tablet As written on box 07/17/20   Loura Halt A, NP  Calcium Carbonate Antacid (TUMS CHEWY BITES PO) Take 1 tablet by mouth as needed.     [provider]  CLINPRO 5000 1.1 % PSTE APPLY PEASIZED AMOUNT, BRUSH DAILY AT LEAST 2 MINUTES. SPIT AFTER USE 04/11/19   [provider]  donepezil (ARICEPT) 10 MG tablet Take 1 tablet (10 mg total) by mouth at bedtime. 06/07/20   [name], MD  finasteride (PROSCAR) 5 MG tablet TAKE 1 TABLET BY MOUTH EVERY DAY 07/17/20   [name], MD  FLUZONE HIGH-DOSE QUADRIVALENT 0.7 ML SUSY  05/02/20   [provider]  isosorbide mononitrate (IMDUR) 30 MG 24 hr tablet TAKE 1 TABLET BY MOUTH EVERY DAY 08/31/19   [name], MD  Multiple Vitamin (MULTIVITAMIN) tablet Take 1 tablet by mouth daily.    [provider]  ondansetron (ZOFRAN) 8 MG tablet Take 1 tablet (8 mg total) by mouth 2 (two) times daily as needed (Nausea or vomiting). 10/18/19   [name], MD  prochlorperazine (COMPAZINE) 10 MG tablet Take 1 tablet (10 mg total) by mouth every 6 (six) hours as needed (Nausea or vomiting). 10/18/19   [name], MD  tamsulosin (FLOMAX) 0.4 MG CAPS capsule TAKE 2 CAPSULES BY MOUTH EVERY DAY 07/07/20   [name], MD    No Known Allergies  Family History  Problem Relation Age of Onset  . Hypertension Mother   . Arthritis Father   . Other Other        no premature CAD.  [name] Colon cancer Neg Hx   . Prostate cancer Neg Hx   . Stomach cancer Neg Hx     Social History Social History   Tobacco Use  . Smoking status: Never Smoker  . Smokeless tobacco: Never Used  Vaping Use  . Vaping Use: Never used  Substance Use Topics  . Alcohol use: No  . Drug use: No    Review of Systems Constitutional: Positive for low-grade fever. Cardiovascular: Negative for chest pain. Respiratory: Negative for shortness of breath.  Positive for cough. Gastrointestinal: Negative for abdominal pain, vomiting and diarrhea. Genitourinary: Negative for urinary compaints Musculoskeletal: Negative for musculoskeletal complaints Neurological: Negative for headache All other ROS negative  ____________________________________________   PHYSICAL EXAM:  VITAL SIGNS: ED Triage Vitals  Enc Vitals Group     BP 07/18/20 0941 (!) 129/49     Pulse Rate 07/18/20 0939 85     Resp 07/18/20 0939 18     Temp 07/18/20 0939 99.4 F (37.4 C)     Temp Source 07/18/20 0939 Oral     SpO2 07/18/20 0939 100 %     Weight 07/18/20 0935 167 lb 12.3 oz (76.1 kg)     Height 07/18/20 0935 5\' 5"  (1.651 m)     Head Circumference --      Peak Flow --      Pain Score 07/18/20 0935 0     Pain Loc --      Pain Edu? --      Excl. in [place]? --    Constitutional: Alert and oriented. Well appearing and in no  distress. Eyes: Normal exam ENT      Head: Normocephalic and atraumatic.      Mouth/Throat: Mucous membranes are moist. Cardiovascular: Normal rate, regular rhythm. Respiratory: Normal respiratory effort without tachypnea nor retractions. Breath sounds are clear  Gastrointestinal: Soft and nontender. No distention.   Musculoskeletal: Nontender with normal range of motion in all extremities.  Neurologic:  Normal speech and language. No gross focal neurologic deficits Skin:  Skin is warm, dry and intact.  Psychiatric: Mood and affect are normal.   ____________________________________________    EKG  EKG viewed and interpreted by myself shows a normal sinus rhythm at 83 bpm with a narrow QRS, left axis deviation, largely normal intervals and nonspecific ST changes.  ____________________________________________    RADIOLOGY  Chest x-ray bibasilar atelectasis versus infiltrate ____________________________________________   INITIAL IMPRESSION / ASSESSMENT AND PLAN / ED COURSE  Pertinent labs & imaging results that were available during my care of the patient were reviewed by me and considered in my medical decision making (see chart for details).   Patient presents to the emergency department for neutropenia, fever and cough.  Patient likely with neutropenic fever, we will check labs, cultures, start the patient on broad-spectrum antibiotics.  We will check a Covid swab, chest x-ray and urinalysis as well.  Patient will ultimately require admission to the hospital service once his emergency department work-up is been completed.  Currently the patient appears well, reassuring vitals, currently afebrile at 99.4.  Lab work confirms neutropenia, chest x-ray shows possible infiltrates.  Remainder the patient's work-up is largely normal thus far, urine pending.  Covid negative.  Given the patient's neutropenic status with fever as high as 101 patient will be admitted to the hospital service  for IV antibiotics.  [place] Frank Mcdaniel was evaluated in Emergency Department on 07/18/2020 for the symptoms described in the history of present illness. He was evaluated in the context of the global COVID-19 pandemic, which necessitated consideration that the patient might be at risk for infection with the SARS-CoV-2 virus that causes COVID-19. Institutional protocols and algorithms that pertain to the evaluation of patients at risk for COVID-19 are in a state of rapid change based on information released by  regulatory bodies including the CDC and federal and state organizations. These policies and algorithms were followed during the patient's care in the ED.  ____________________________________________   FINAL CLINICAL IMPRESSION(S) / ED DIAGNOSES  Neutropenic fever   Harvest Dark, MD 07/18/20 1211

## 2020-07-19 ENCOUNTER — Inpatient Hospital Stay: Payer: PPO

## 2020-07-19 DIAGNOSIS — R5081 Fever presenting with conditions classified elsewhere: Secondary | ICD-10-CM

## 2020-07-19 DIAGNOSIS — Z515 Encounter for palliative care: Secondary | ICD-10-CM

## 2020-07-19 DIAGNOSIS — D709 Neutropenia, unspecified: Principal | ICD-10-CM

## 2020-07-19 LAB — RESPIRATORY PANEL BY PCR

## 2020-07-19 LAB — COVID-19, FLU A+B AND RSV
Influenza A, NAA: NOT DETECTED
Influenza B, NAA: NOT DETECTED
RSV, NAA: NOT DETECTED
SARS-CoV-2, NAA: NOT DETECTED

## 2020-07-19 LAB — BLOOD CULTURE ID PANEL (REFLEXED) - BCID2

## 2020-07-19 LAB — CBC WITH DIFFERENTIAL/PLATELET
Abs Immature Granulocytes: 0 10*3/uL (ref 0.00–0.07)
Basophils Absolute: 0 10*3/uL (ref 0.0–0.1)
Basophils Relative: 0 %
Eosinophils Absolute: 0 10*3/uL (ref 0.0–0.5)
Eosinophils Relative: 1 %
HCT: 20.7 % — ABNORMAL LOW (ref 39.0–52.0)
Hemoglobin: 6.7 g/dL — ABNORMAL LOW (ref 13.0–17.0)
Immature Granulocytes: 0 %
Lymphocytes Relative: 1 %
Lymphs Abs: 0 10*3/uL — ABNORMAL LOW (ref 0.7–4.0)
MCH: 33.3 pg (ref 26.0–34.0)
MCHC: 32.4 g/dL (ref 30.0–36.0)
MCV: 103 fL — ABNORMAL HIGH (ref 80.0–100.0)
Monocytes Absolute: 0 10*3/uL — ABNORMAL LOW (ref 0.1–1.0)
Monocytes Relative: 4 %
Neutro Abs: 0.8 10*3/uL — ABNORMAL LOW (ref 1.7–7.7)
Neutrophils Relative %: 94 %
Platelets: 74 10*3/uL — ABNORMAL LOW (ref 150–400)
RBC: 2.01 MIL/uL — ABNORMAL LOW (ref 4.22–5.81)
RDW: 20 % — ABNORMAL HIGH (ref 11.5–15.5)
Smear Review: DECREASED
WBC: 0.8 10*3/uL — CL (ref 4.0–10.5)
nRBC: 2.5 % — ABNORMAL HIGH (ref 0.0–0.2)

## 2020-07-19 LAB — CBC
HCT: 20 % — ABNORMAL LOW (ref 39.0–52.0)
Hemoglobin: 6.7 g/dL — ABNORMAL LOW (ref 13.0–17.0)
MCH: 33.8 pg (ref 26.0–34.0)
MCHC: 33.5 g/dL (ref 30.0–36.0)
MCV: 101 fL — ABNORMAL HIGH (ref 80.0–100.0)
Platelets: 75 10*3/uL — ABNORMAL LOW (ref 150–400)
RBC: 1.98 MIL/uL — ABNORMAL LOW (ref 4.22–5.81)
RDW: 19.7 % — ABNORMAL HIGH (ref 11.5–15.5)
WBC: 0.8 10*3/uL — CL (ref 4.0–10.5)
nRBC: 0 % (ref 0.0–0.2)

## 2020-07-19 LAB — BASIC METABOLIC PANEL
Anion gap: 9 (ref 5–15)
BUN: 25 mg/dL — ABNORMAL HIGH (ref 8–23)
CO2: 21 mmol/L — ABNORMAL LOW (ref 22–32)
Calcium: 8.3 mg/dL — ABNORMAL LOW (ref 8.9–10.3)
Chloride: 108 mmol/L (ref 98–111)
Creatinine, Ser: 1.37 mg/dL — ABNORMAL HIGH (ref 0.61–1.24)
GFR, Estimated: 51 mL/min — ABNORMAL LOW (ref >60)
Glucose, Bld: 111 mg/dL — ABNORMAL HIGH (ref 70–99)
Potassium: 3.5 mmol/L (ref 3.5–5.1)
Sodium: 138 mmol/L (ref 135–145)

## 2020-07-19 LAB — HEPATIC FUNCTION PANEL
ALT: 15 U/L (ref 0–44)
AST: 30 U/L (ref 15–41)
Albumin: 3.3 g/dL — ABNORMAL LOW (ref 3.5–5.0)
Alkaline Phosphatase: 46 U/L (ref 38–126)
Bilirubin, Direct: 0.6 mg/dL — ABNORMAL HIGH (ref 0.0–0.2)
Indirect Bilirubin: 2 mg/dL — ABNORMAL HIGH (ref 0.3–0.9)
Total Bilirubin: 2.6 mg/dL — ABNORMAL HIGH (ref 0.3–1.2)
Total Protein: 6.1 g/dL — ABNORMAL LOW (ref 6.5–8.1)

## 2020-07-19 LAB — PREPARE RBC (CROSSMATCH)

## 2020-07-19 LAB — MRSA PCR SCREENING: MRSA by PCR: NEGATIVE

## 2020-07-19 LAB — STREP PNEUMONIAE URINARY ANTIGEN: Strep Pneumo Urinary Antigen: NEGATIVE

## 2020-07-19 MED ORDER — SODIUM CHLORIDE 0.9% IV SOLUTION
Freq: Once | INTRAVENOUS | Status: DC
  Filled 2020-07-19: qty 250

## 2020-07-19 MED ORDER — ALBUTEROL SULFATE (2.5 MG/3ML) 0.083% IN NEBU
2.5000 mg | INHALATION_SOLUTION | Freq: Four times a day (QID) | RESPIRATORY_TRACT | Status: DC
  Administered 2020-07-19 – 2020-07-21 (×10): 2.5 mg via RESPIRATORY_TRACT
  Filled 2020-07-19 (×11): qty 3

## 2020-07-19 MED ORDER — HALOPERIDOL 1 MG PO TABS
1.0000 mg | ORAL_TABLET | Freq: Four times a day (QID) | ORAL | Status: DC | PRN
  Administered 2020-07-23: 05:00:00 1 mg via ORAL
  Filled 2020-07-19 (×4): qty 1

## 2020-07-19 MED ORDER — HALOPERIDOL LACTATE 5 MG/ML IJ SOLN
1.0000 mg | Freq: Four times a day (QID) | INTRAMUSCULAR | Status: DC | PRN
  Administered 2020-07-19: 16:00:00 1 mg via INTRAMUSCULAR
  Filled 2020-07-19: qty 1

## 2020-07-19 MED ORDER — FUROSEMIDE 10 MG/ML IJ SOLN
40.0000 mg | Freq: Once | INTRAMUSCULAR | Status: AC
  Administered 2020-07-19: 14:00:00 40 mg via INTRAVENOUS
  Filled 2020-07-19: qty 4

## 2020-07-19 NOTE — ED Notes (Signed)
Date and time results received: 07/19/20 3:02 AM (use smartphrase ".now" to insert current time)  Test: Blood Cultures Critical Value: 1 out of 4, Gram Positive Cocci  Name of Provider Notified: Rufina Falco, NP  Orders Received? Or Actions Taken?: provider notified.

## 2020-07-19 NOTE — Progress Notes (Signed)
Pt. suctionned for mod. Amt frothy white secretions. Pt. Tolerated well.

## 2020-07-19 NOTE — Progress Notes (Signed)
Patient had Yellow MEWS on and off today. Yellow MEWS, at 0924, his RR were 29, and B/P 110/97. At 1200, RR were 29. At 1633, pulse was 104. Notified MD, charge nurse, and 1A nurse when he got transferred. For interventions, patient received deep suctioning to help relieve thick secretions from airway, to improve breathing.

## 2020-07-19 NOTE — Progress Notes (Signed)
PROGRESS NOTE    [name]  RCV:[id number] DOB: 12/10/1932 DOA: 07/18/2020 PCP: [name], MD   Chief Complaint  Patient presents with  . Neutropenia    Brief Narrative: Frank Mcdaniel is Frank Mcdaniel 86 y.o. male with medical history significant for anxiety, GERD, hypertension, anemia, history of recurrent hairy cell leukemia status post bone marrow biopsy on 11/02/19 which revealed recurrent hairy cell leukemia.  Patient was started on Rituxan and Cladribine in April, 2021.  It appears he did not receive any further chemotherapy until November, 29, 2021 when he received Cyril Railey dose of Rituxan plus cladribine, cycle 1, day 1.  His son states that he ended up in the hospital after the initial treatment in March. About 2 days prior to presenting to the emergency room he developed [name] fever with [name] T-max of 101 F and [name] wet sounding cough which is productive of occasional clear phlegm and was seen at the urgent care center where he was started on antibiotics for presumed acute bronchitis.  Patient went to the cancer center on the day of his admission for chemotherapy infusion and labs showed neutropenia.  He was also febrile with [name] T-max of 100.7 F.  He was sent to the emergency room for further evaluation. He denies having any chest pain, no shortness of breath, no nausea, no vomiting no abdominal pain, no headache, no dizziness, no lightheadedness, no sore throat. Labs show sodium 134, potassium 4.3, chloride 104, bicarb 22, glucose 133, BUN 26, creatinine 1.34 calcium 8.4, alkaline phosphatase 47, albumin 3.3, AST 34, ALT 12, total protein 6.3, total bili 2.0, BNP 158, lactic acid 1.6, white cell count 0.7, hemoglobin 5.5, hematocrit 17, MCV 103, RDW 18.6, platelet count 71 Respiratory viral panel is negative Chest x ray reviewed by me shows mild bibasilar atelectasis/infiltrate. Twelve-lead EKG shows sinus rhythm, LVH and right bundle branch block  ED Course: Patient is an 86 year old male  sent from the cancer center to the emergency room for evaluation of [name] fever with [name] T-max of 100.7 F.  Patient has [name] history of recurrent hairy cell leukemia and was recently started back on chemotherapy.  He was scheduled for an infusion on the day of admission but was sent to the ER due to marked neutropenia and fever.  He received Lavance Beazer dose of vancomycin and cefepime in the ER and will be admitted to the hospital for further evaluation.  Assessment & Plan:   Principal Problem:   Febrile neutropenia (HCC) Active Problems:   HTN (hypertension)   Hairy cell leukemia (HCC)   Anemia   Anxiety   Dementia without behavioral disturbance (HCC)   Pancytopenia (HCC)   CKD (chronic kidney disease), stage III (HCC)   Palliative care encounter  Febrile Neutropenia Community Acquired Pneumonia Last fever 12/14 ~1719 ANC 0.8 today Blood cx with gram positive cocci in 1 of 4 bottles - suspect this is contaminant - BCID with staph species, negative staph aureus  MRSA PCR negative.  Follow sputum cx if able. UA not concerning for UTI.  Follow blood cultures. Continue cefepime Oncology c/s, appreciate recommendations - they note 2/2 clardribine  Acute Hypoxic Respiratory Failure 2/2 above, oxygen fluctuating - requiring as much as 6 L overnight CXR with diffuse interstitial prominence and minimal bibasilar opacities - edema vs infection Will give dose of lasix with blood today Echo 2021 with EF 10-25%, grade 1 diastolic dysfunction, see report SLP eval - thick secretions -> will request RT for deep suction -  NPO for now  Pancytopenia Multifactorial and secondary to hairy cell leukemia as well as chemotherapy-induced Transfuse 1 unit pRBC today Monitor platelet count closely no indication for platelet transfusion at this time Appreciate oncology recs   Recurrent hairy cell leukemia On chemotherapy with oncology Follow outpatient with oncology  Coronary artery disease Continue nitrates as  much as blood pressure tolerates Hold aspirin for now due to thrombocytopenia Continue statins  BPH Continue Flomax and finasteride  Dementia Continue Aricept  Chronic kidney disease, stage IIIa Renal function is stable We will monitor closely  Goals of care: palliative care c/s requested   DVT prophylaxis: SCD Code Status: DNR Family Communication: son at bedside Disposition:   Status is: Inpatient  Remains inpatient appropriate because:Inpatient level of care appropriate due to severity of illness   Dispo: The patient is from: Home              Anticipated d/c is to: pending              Anticipated d/c date is: > 3 days              Patient currently is not medically stable to d/c.     Consultants:   Palliative care  Procedures: none  Antimicrobials: Anti-infectives (From admission, onward)   Start     Dose/Rate Route Frequency Ordered Stop   07/18/20 2200  ceFEPIme (MAXIPIME) 2 g in sodium chloride 0.9 % 100 mL IVPB        2 g 200 mL/hr over 30 Minutes Intravenous Every 12 hours 07/18/20 1236     07/18/20 1115  vancomycin (VANCOREADY) IVPB 1500 mg/300 mL        1,500 mg 150 mL/hr over 120 Minutes Intravenous  Once 07/18/20 1037 07/18/20 1359   07/18/20 1015  ceFEPIme (MAXIPIME) 2 g in sodium chloride 0.9 % 100 mL IVPB        2 g 200 mL/hr over 30 Minutes Intravenous  Once 07/18/20 1008 07/18/20 1153   07/18/20 1015  vancomycin (VANCOCIN) IVPB 1000 mg/200 mL premix  Status:  Discontinued        1,000 mg 200 mL/hr over 60 Minutes Intravenous  Once 07/18/20 1008 07/18/20 1037     Subjective: No complaints  Objective: Vitals:   07/19/20 0700 07/19/20 0826 07/19/20 0924 07/19/20 1200  BP: 129/63 129/64 (!) 110/97   Pulse: 93 82 84   Resp: (!) 32 (!) 33 (!) 29 (!) 29  Temp:  99.8 F (37.7 C) 99.3 F (37.4 C)   TempSrc:  Oral Oral   SpO2: 99% 100% 100%   Weight:      Height:        Intake/Output Summary (Last 24 hours) at 07/19/2020  1517 Last data filed at 07/19/2020 1100 Gross per 24 hour  Intake 1023.92 ml  Output 600 ml  Net 423.92 ml   Filed Weights   07/18/20 0935  Weight: 76.1 kg    Examination:  General exam: Appears calm and comfortable  Respiratory system: coarse rhonchi bilaterally  Cardiovascular system: RRR Gastrointestinal system: Abdomen is nondistended, soft and nontender. Central nervous system: Alert and oriented. No focal neurological deficits. Extremities: no LEE Skin: No rashes, lesions or ulcers Psychiatry: Judgement and insight appear normal. Mood & affect appropriate.     Data Reviewed: I have personally reviewed following labs and imaging studies  CBC: Recent Labs  Lab 07/18/20 0830 07/18/20 1007 07/19/20 0132  WBC 0.8* 0.7* 0.8*  0.8*  NEUTROABS 0.7*  0.7* 0.8*  HGB 6.2* 5.5* 6.7*  6.7*  HCT 18.8* 17.0* 20.7*  20.0*  MCV 102.7* 103.0* 103.0*  101.0*  PLT 83* 71* 74*  75*    Basic Metabolic Panel: Recent Labs  Lab 07/18/20 0830 07/18/20 1007 07/19/20 0132  NA 134* 134* 138  K 3.6 4.3 3.5  CL 102 104 108  CO2 19* 22 21*  GLUCOSE 153* 133* 111*  BUN 24* 26* 25*  CREATININE 1.43* 1.34* 1.37*  CALCIUM 8.5* 8.4* 8.3*    GFR: Estimated Creatinine Clearance: 37.5 mL/min (Taniesha Glanz) (by C-G formula based on SCr of 1.37 mg/dL (H)).  Liver Function Tests: Recent Labs  Lab 07/18/20 1007 07/19/20 0132  AST 34 30  ALT 12 15  ALKPHOS 47 46  BILITOT 2.0* 2.6*  PROT 6.3* 6.1*  ALBUMIN 3.3* 3.3*    CBG: No results for input(s): GLUCAP in the last 168 hours.   Recent Results (from the past 240 hour(s))  COVID-19, Flu Bridey Brookover+B and RSV ([facility])     Status: None   Collection Time: 07/17/20  5:24 PM   Specimen: Nasopharyngeal Swab  Result Value Ref Range Status   SARS-CoV-2, NAA Not Detected Not Detected Final    Comment: This nucleic acid amplification test was developed and its performance characteristics determined by [employer]. Nucleic  acid amplification tests include RT-PCR and TMA. This test has not been FDA cleared or approved. This test has been authorized by FDA under an Emergency Use Authorization (EUA). This test is only authorized for the duration of time the declaration that circumstances exist justifying the authorization of the emergency use of in vitro diagnostic tests for detection of SARS-CoV-2 virus and/or diagnosis of COVID-19 infection under section 564(b)(1) of the Act, 21 U.S.C. 726OMB-5(D) (1), unless the authorization is terminated or revoked sooner. When diagnostic testing is negative, the possibility of Ilya Neely false negative result should be considered in the context of Orvis Stann patient's recent exposures and the presence of clinical signs and symptoms consistent with COVID-19. An individual without symptoms of COVID-19 and who is not shedding SARS-CoV-2 virus wo uld expect to have Deakon Frix negative (not detected) result in this assay.    Influenza Quisha Mabie, NAA Not Detected Not Detected Final   Influenza B, NAA Not Detected Not Detected Final   RSV, NAA Not Detected Not Detected Final  Blood Culture (routine x 2)     Status: None (Preliminary result)   Collection Time: 07/18/20 10:10 AM   Specimen: BLOOD  Result Value Ref Range Status   Specimen Description BLOOD BLOOD RIGHT FOREARM  Final   Special Requests   Final    BOTTLES DRAWN AEROBIC AND ANAEROBIC Blood Culture adequate volume   Culture   Final    NO GROWTH < 24 HOURS Performed at [facility], [address], [place], [place] [address]    Report Status PENDING  Incomplete  Resp Panel by RT-PCR (Flu Malina Geers&B, Covid) Nasopharyngeal Swab     Status: None   Collection Time: 07/18/20 10:10 AM   Specimen: Nasopharyngeal Swab; Nasopharyngeal(NP) swabs in vial transport medium  Result Value Ref Range Status   SARS Coronavirus 2 by RT PCR NEGATIVE NEGATIVE Final    Comment: (NOTE) SARS-CoV-2 target nucleic acids are NOT DETECTED.  The SARS-CoV-2 RNA is  generally detectable in upper respiratory specimens during the acute phase of infection. The lowest concentration of SARS-CoV-2 viral copies this assay can detect is 138 copies/mL. Eliyana Pagliaro negative result does not preclude SARS-Cov-2 infection and should  not be used as the sole basis for treatment or other patient management decisions. Gabryel Files negative result may occur with  improper specimen collection/handling, submission of specimen other than nasopharyngeal swab, presence of viral mutation(s) within the areas targeted by this assay, and inadequate number of viral copies(<138 copies/mL). Elisabeth Strom negative result must be combined with clinical observations, patient history, and epidemiological information. The expected result is Negative.  Fact Sheet for Patients:  EntrepreneurPulse.com.au  Fact Sheet for Healthcare Providers:  IncredibleEmployment.be  This test is no t yet approved or cleared by the [place] FDA and  has been authorized for detection and/or diagnosis of SARS-CoV-2 by FDA under an Emergency Use Authorization (EUA). This EUA will remain  in effect (meaning this test can be used) for the duration of the COVID-19 declaration under Section 564(b)(1) of the Act, 21 U.S.C.section 360bbb-3(b)(1), unless the authorization is terminated  or revoked sooner.       Influenza Rashied Corallo by PCR NEGATIVE NEGATIVE Final   Influenza B by PCR NEGATIVE NEGATIVE Final    Comment: (NOTE) The Xpert Xpress SARS-CoV-2/FLU/RSV plus assay is intended as an aid in the diagnosis of influenza from Nasopharyngeal swab specimens and should not be used as Hau Sanor sole basis for treatment. Nasal washings and aspirates are unacceptable for Xpert Xpress SARS-CoV-2/FLU/RSV testing.  Fact Sheet for Patients: EntrepreneurPulse.com.au  Fact Sheet for Healthcare Providers: IncredibleEmployment.be  This test is not yet approved or cleared by the [place] FDA and has been authorized for detection and/or diagnosis of SARS-CoV-2 by FDA under an Emergency Use Authorization (EUA). This EUA will remain in effect (meaning this test can be used) for the duration of the COVID-19 declaration under Section 564(b)(1) of the Act, 21 U.S.C. section 360bbb-3(b)(1), unless the authorization is terminated or revoked.  Performed at [facility], [address] [place], [place] [address]   Blood Culture (routine x 2)     Status: None (Preliminary result)   Collection Time: 07/18/20 10:47 AM   Specimen: BLOOD  Result Value Ref Range Status   Specimen Description BLOOD LEFT ANTECUBITAL  Final   Special Requests   Final    BOTTLES DRAWN AEROBIC AND ANAEROBIC Blood Culture results may not be optimal due to an excessive volume of blood received in culture bottles   Culture  Setup Time   Final    GRAM POSITIVE COCCI AEROBIC BOTTLE ONLY Organism ID to follow CRITICAL RESULT CALLED TO, READ BACK BY AND VERIFIED WITH: Wandalee Ferdinand RN [id number] 07/19/20 HNM Performed at [facility], [address] [place], [place] [address]    Culture GRAM POSITIVE COCCI  Final   Report Status PENDING  Incomplete  Blood Culture ID Panel (Reflexed)     Status: Abnormal   Collection Time: 07/18/20 10:47 AM  Result Value Ref Range Status   Enterococcus faecalis NOT DETECTED NOT DETECTED Final   Enterococcus Faecium NOT DETECTED NOT DETECTED Final   Listeria monocytogenes NOT DETECTED NOT DETECTED Final   Staphylococcus species DETECTED (Derreck Wiltsey) NOT DETECTED Final    Comment: CRITICAL RESULT CALLED TO, READ BACK BY AND VERIFIED WITH: Wandalee Ferdinand RN [phone] 07/19/20 HNM    Staphylococcus aureus (BCID) NOT DETECTED NOT DETECTED Final   Staphylococcus epidermidis NOT DETECTED NOT DETECTED Final   Staphylococcus lugdunensis NOT DETECTED NOT DETECTED Final   Streptococcus species NOT DETECTED NOT DETECTED Final   Streptococcus agalactiae NOT DETECTED NOT  DETECTED Final   Streptococcus pneumoniae NOT DETECTED NOT DETECTED Final   Streptococcus  pyogenes NOT DETECTED NOT DETECTED Final   Griffith Santilli.calcoaceticus-baumannii NOT DETECTED NOT DETECTED Final   Bacteroides fragilis NOT DETECTED NOT DETECTED Final   Enterobacterales NOT DETECTED NOT DETECTED Final   Enterobacter cloacae complex NOT DETECTED NOT DETECTED Final   Escherichia coli NOT DETECTED NOT DETECTED Final   Klebsiella aerogenes NOT DETECTED NOT DETECTED Final   Klebsiella oxytoca NOT DETECTED NOT DETECTED Final   Klebsiella pneumoniae NOT DETECTED NOT DETECTED Final   Proteus species NOT DETECTED NOT DETECTED Final   Salmonella species NOT DETECTED NOT DETECTED Final   Serratia marcescens NOT DETECTED NOT DETECTED Final   Haemophilus influenzae NOT DETECTED NOT DETECTED Final   Neisseria meningitidis NOT DETECTED NOT DETECTED Final   Pseudomonas aeruginosa NOT DETECTED NOT DETECTED Final   Stenotrophomonas maltophilia NOT DETECTED NOT DETECTED Final   Candida albicans NOT DETECTED NOT DETECTED Final   Candida auris NOT DETECTED NOT DETECTED Final   Candida glabrata NOT DETECTED NOT DETECTED Final   Candida krusei NOT DETECTED NOT DETECTED Final   Candida parapsilosis NOT DETECTED NOT DETECTED Final   Candida tropicalis NOT DETECTED NOT DETECTED Final   Cryptococcus neoformans/gattii NOT DETECTED NOT DETECTED Final    Comment: Performed at [facility], [address] [place], [place] [address]  MRSA PCR Screening     Status: None   Collection Time: 07/19/20  8:26 AM   Specimen: Nasopharyngeal  Result Value Ref Range Status   MRSA by PCR NEGATIVE NEGATIVE Final    Comment:        The GeneXpert MRSA Assay (FDA approved for NASAL specimens only), is one component of Elinore Shults comprehensive MRSA colonization surveillance program. It is not intended to diagnose MRSA infection nor to guide or monitor treatment for MRSA infections. Performed at [facility], [address], [place], [place] [address]          Radiology Studies: DG Chest 2 View  Result Date: 07/17/2020 CLINICAL DATA:  Cough, fever, shortness of breath. EXAM: CHEST - 2 VIEW COMPARISON:  Radiograph and CT 11/08/2019 FINDINGS: Post median sternotomy and CABG.The cardiomediastinal contours are normal. There is bronchial thickening. Pulmonary vasculature is normal. No consolidation, pleural effusion, or pneumothorax. Exaggerated thoracic kyphosis with multilevel degenerative change in the thoracic spine. No acute osseous abnormalities are seen. Please note the anterior aspect of the sternum is not included in the field of view on the lateral. IMPRESSION: 1. Bronchial thickening without pneumonia. 2. Post CABG. Electronically Signed   By: Keith Rake M.D.   On: 07/17/2020 17:03   DG Chest Port 1 View  Result Date: 07/19/2020 CLINICAL DATA:  Increased shortness of breath EXAM: PORTABLE CHEST 1 VIEW COMPARISON:  07/18/2020 and prior FINDINGS: Mild hypoinflation. No pneumothorax or pleural effusion. Minimal bibasilar opacities. Diffuse interstitial prominence. Postsurgical appearance of the cardiomediastinal silhouette is unchanged. No acute osseous abnormality. IMPRESSION: Diffuse interstitial prominence and minimal bibasilar opacities, edema versus infection. Electronically Signed   By: Primitivo Gauze M.D.   On: 07/19/2020 07:53   DG Chest Port 1 View  Result Date: 07/18/2020 CLINICAL DATA:  Question sepsis EXAM: PORTABLE CHEST 1 VIEW COMPARISON:  07/17/2020 FINDINGS: Postop CABG.  Heart size normal.  Negative for heart failure. Mild bibasilar atelectasis has developed since yesterday. No effusion. IMPRESSION: Interval development of mild bibasilar atelectasis/infiltrate. Electronically Signed   By: Franchot Gallo M.D.   On: 07/18/2020 10:38        Scheduled Meds: . sodium chloride   Intravenous Once  .  sodium chloride   Intravenous Once  . albuterol  2.5 mg Nebulization  Q6H  . atorvastatin  20 mg Oral Daily  . donepezil  10 mg Oral QHS  . finasteride  5 mg Oral Daily  . isosorbide mononitrate  30 mg Oral Daily  . multivitamin with minerals  1 tablet Oral Daily  . tamsulosin  0.8 mg Oral QHS   Continuous Infusions: . sodium chloride 100 mL/hr at 07/19/20 0727  . ceFEPime (MAXIPIME) IV 2 g (07/19/20 1409)     LOS: 1 day    Time spent: over 30 min    Fayrene Helper, MD [facility]   To contact the attending provider between 7A-7P or the covering provider during after hours 7P-7A, please log into the web site [email or web address] and access using universal [place] password for that web site. If you do not have the password, please call the hospital operator.  07/19/2020, 3:17 PM

## 2020-07-19 NOTE — ED Notes (Signed)
Pt noted to have urine on the linen and a BM in his depends. This RN and male RN cleaned pt, removed soiled linen and depends and replaced with new linen and depends. Pt repositioned. Pt on cardiac, bp and pulse ox monitor. Son at bedside in [place].

## 2020-07-19 NOTE — Consult Note (Signed)
[place]  [phone] [phone]  ID: FAVOR HACKLER OB: 08-08-1932  MR#: [id number]  SWF#:[id number]  Patient Care Team: [name], MD as PCP - General (Family Medicine) Rockey Situ Kathlene November, MD as PCP - Cardiology (Cardiology) [name], MD as Consulting Physician (Cardiology) [name], MD as Consulting Physician (Oncology) [name] as Physician Assistant (Urology)  CHIEF COMPLAINT: Hairy cell leukemia, neutropenic fever.  INTERVAL HISTORY: Patient is an 86 year old male who is actively receiving treatment with Rituxan for hairy cell leukemia.  He received 5 days of cladribine from November 29 through December 3 that is likely contributing to his pancytopenia.  He is now receiving weekly Rituxan, most recently approximately 7 days ago.  Patient is more confused than his baseline and review of systems is difficult, but he offers no specific complaints.  No family members are at bedside.  REVIEW OF SYSTEMS:   Review of Systems  Unable to perform ROS: Dementia    PAST MEDICAL HISTORY: Past Medical History:  Diagnosis Date  . Anemia 09/01/2015  . Anxiety 09/11/2015  . Arthritis   . Asthma    as a teenager  . Asymptomatic Sinus Bradycardia   . Atypical chest pain    a. 08/2014  . BPH (benign prostatic hyperplasia)   . CKD (chronic kidney disease), stage III ([place])   . Closed fracture of tuft of distal phalanx of finger 08/31/2018  . Coronary artery disease    a. 2000 s/p CABG;  b. 10/2008 Neg MV, EF 64%; c. cath 05/2015: LM 85% sev cal, pLAD-1 lesion 80%, pLAD-2 lesion 100% chronic, ostLCx 70% sev cal, OM4 90%, ostRCA 70%, mid RCA 90%, LIMA-LAD patent, VG-OM2 patent, VG-OM3 patent, VG-RPDA patent. No AS  . Dementia without behavioral disturbance ([place]) 05/28/2019  . GERD (gastroesophageal reflux disease)   . H/O echocardiogram    a. 10/2005 Echo: nl EF.  [name] Hairy cell leukemia ([place])   . HOH (hard of  hearing)    Bilateral hearing aids  . HTN (hypertension)   . Hyperlipidemia   . Hypertension   . Leukemia ([place])   . Neutropenic fever ([place]) 11/08/2019  . Stricture and stenosis of esophagus     PAST SURGICAL HISTORY: Past Surgical History:  Procedure Laterality Date  . CARDIAC CATHETERIZATION N/A 05/26/2015   Procedure: Left Heart Cath;  Surgeon: [name], MD;  Location: [facility];  Service: Cardiovascular;  Laterality: N/A;  . CARDIAC CATHETERIZATION N/A 05/26/2015   Procedure: Coronary/Graft Angiography;  Surgeon: [name], MD;  Location: [place] [facility];  Service: Cardiovascular;  Laterality: N/A;  . CATARACT EXTRACTION    . CORONARY ARTERY BYPASS GRAFT  2000  . ESOPHAGOGASTRODUODENOSCOPY N/A 02/18/2017   Procedure: ESOPHAGOGASTRODUODENOSCOPY (EGD) with removal of food bolus;  Surgeon: [name], MD;  Location: [facility] ENDOSCOPY;  Service: Endoscopy;  Laterality: N/A;  . ESOPHAGOGASTRODUODENOSCOPY (EGD) WITH PROPOFOL N/A 03/11/2017   Procedure: ESOPHAGOGASTRODUODENOSCOPY (EGD) WITH PROPOFOL;  Surgeon: [name], MD;  Location: [facility] ENDOSCOPY;  Service: Endoscopy;  Laterality: N/A;  . ESOPHAGOGASTRODUODENOSCOPY (EGD) WITH PROPOFOL N/A 04/15/2017   Procedure: ESOPHAGOGASTRODUODENOSCOPY (EGD) WITH PROPOFOL;  Surgeon: [name], MD;  Location: [facility] ENDOSCOPY;  Service: Endoscopy;  Laterality: N/A;  . EYE SURGERY Left    Cataract Extraction with IOL  . INGUINAL HERNIA REPAIR Left 06/24/2016   Procedure: HERNIA REPAIR INGUINAL ADULT;  Surgeon: [name], MD;  Location: [facility] ORS;  Service: General;  Laterality: Left;  .  TONSILLECTOMY  1942   . UPPER GASTROINTESTINAL ENDOSCOPY  3 years ago    with Dilation.     FAMILY HISTORY: Family History  Problem Relation Age of Onset  . Hypertension Mother   . Arthritis Father   . Other Other        no premature CAD.  [name] Colon cancer Neg Hx   . Prostate cancer Neg Hx   . Stomach cancer Neg Hx      ADVANCED DIRECTIVES (Y/N):  @ADVDIR @  HEALTH MAINTENANCE: Social History   Tobacco Use  . Smoking status: Never Smoker  . Smokeless tobacco: Never Used  Vaping Use  . Vaping Use: Never used  Substance Use Topics  . Alcohol use: No  . Drug use: No     Colonoscopy:  PAP:  Bone density:  Lipid panel:  No Known Allergies  Current Facility-Administered Medications  Medication Dose Route Frequency Provider Last Rate Last Admin  . 0.9 %  sodium chloride infusion (Manually program via Guardrails IV Fluids)   Intravenous Once Agbata, Tochukwu, MD      . 0.9 %  sodium chloride infusion (Manually program via Guardrails IV Fluids)   Intravenous Once Elodia Florence., MD      . 0.9 %  sodium chloride infusion   Intravenous Continuous Agbata, Tochukwu, MD 100 mL/hr at 07/19/20 0727 Infusion Verify at 07/19/20 0727  . acetaminophen (TYLENOL) tablet 650 mg  650 mg Oral Q6H PRN Agbata, Tochukwu, MD   650 mg at 07/18/20 1541   Or  . acetaminophen (TYLENOL) suppository 650 mg  650 mg Rectal Q6H PRN Agbata, Tochukwu, MD      . albuterol (PROVENTIL) (2.5 MG/3ML) 0.083% nebulizer solution 2.5 mg  2.5 mg Nebulization Q6H Elodia Florence., MD      . atorvastatin (LIPITOR) tablet 20 mg  20 mg Oral Daily Agbata, Tochukwu, MD   20 mg at 07/19/20 1355  . ceFEPIme (MAXIPIME) 2 g in sodium chloride 0.9 % 100 mL IVPB  2 g Intravenous Q12H Agbata, Tochukwu, MD 200 mL/hr at 07/19/20 1409 2 g at 07/19/20 1409  . donepezil (ARICEPT) tablet 10 mg  10 mg Oral QHS Agbata, Tochukwu, MD   10 mg at 07/18/20 2134  . finasteride (PROSCAR) tablet 5 mg  5 mg Oral Daily Agbata, Tochukwu, MD   5 mg at 07/19/20 1356  . haloperidol (HALDOL) tablet 1 mg  1 mg Oral Q6H PRN Elodia Florence., MD       Or  . haloperidol lactate (HALDOL) injection 1 mg  1 mg Intramuscular Q6H PRN Elodia Florence., MD      . isosorbide mononitrate (IMDUR) 24 hr tablet 30 mg  30 mg Oral Daily Agbata, Tochukwu, MD   30 mg  at 07/19/20 1358  . multivitamin with minerals tablet 1 tablet  1 tablet Oral Daily Agbata, Tochukwu, MD   1 tablet at 07/19/20 1358  . ondansetron (ZOFRAN) tablet 4 mg  4 mg Oral Q6H PRN Agbata, Tochukwu, MD       Or  . ondansetron (ZOFRAN) injection 4 mg  4 mg Intravenous Q6H PRN Agbata, Tochukwu, MD      . tamsulosin (FLOMAX) capsule 0.8 mg  0.8 mg Oral QHS Agbata, Tochukwu, MD   0.8 mg at 07/18/20 2133    OBJECTIVE: Vitals:   07/19/20 0826 07/19/20 0924  BP: 129/64 (!) 110/97  Pulse: 82 84  Resp: (!) 33 (!) 29  Temp: 99.8 F (37.7  C) 99.3 F (37.4 C)  SpO2: 100% 100%     Body mass index is 27.92 kg/m.    ECOG FS:3 - Symptomatic, >50% confined to bed  General: Well-developed, well-nourished, no acute distress. Eyes: Pink conjunctiva, anicteric sclera. HEENT: Normocephalic, moist mucous membranes. Lungs: No audible wheezing or coughing. Heart: Regular rate and rhythm. Abdomen: Soft, nontender, no obvious distention. Musculoskeletal: No edema, cyanosis, or clubbing. Neuro: Alert, answering all questions appropriately. Cranial nerves grossly intact. Skin: No rashes or petechiae noted. Psych: Normal affect. Lymphatics: No cervical, calvicular, axillary or inguinal LAD.   LAB RESULTS:  Lab Results  Component Value Date   NA 138 07/19/2020   K 3.5 07/19/2020   CL 108 07/19/2020   CO2 21 (L) 07/19/2020   GLUCOSE 111 (H) 07/19/2020   BUN 25 (H) 07/19/2020   CREATININE 1.37 (H) 07/19/2020   CALCIUM 8.3 (L) 07/19/2020   PROT 6.1 (L) 07/19/2020   ALBUMIN 3.3 (L) 07/19/2020   AST 30 07/19/2020   ALT 15 07/19/2020   ALKPHOS 46 07/19/2020   BILITOT 2.6 (H) 07/19/2020   GFRNONAA 51 (L) 07/19/2020   GFRAA 50 (L) 02/08/2020    Lab Results  Component Value Date   WBC 0.8 (LL) 07/19/2020   WBC 0.8 (LL) 07/19/2020   NEUTROABS 0.8 (L) 07/19/2020   HGB 6.7 (L) 07/19/2020   HGB 6.7 (L) 07/19/2020   HCT 20.0 (L) 07/19/2020   HCT 20.7 (L) 07/19/2020   MCV 101.0 (H)  07/19/2020   MCV 103.0 (H) 07/19/2020   PLT 75 (L) 07/19/2020   PLT 74 (L) 07/19/2020     STUDIES: DG Chest 2 View  Result Date: 07/17/2020 CLINICAL DATA:  Cough, fever, shortness of breath. EXAM: CHEST - 2 VIEW COMPARISON:  Radiograph and CT 11/08/2019 FINDINGS: Post median sternotomy and CABG.The cardiomediastinal contours are normal. There is bronchial thickening. Pulmonary vasculature is normal. No consolidation, pleural effusion, or pneumothorax. Exaggerated thoracic kyphosis with multilevel degenerative change in the thoracic spine. No acute osseous abnormalities are seen. Please note the anterior aspect of the sternum is not included in the field of view on the lateral. IMPRESSION: 1. Bronchial thickening without pneumonia. 2. Post CABG. Electronically Signed   By: Keith Rake M.D.   On: 07/17/2020 17:03   DG Chest Port 1 View  Result Date: 07/19/2020 CLINICAL DATA:  Increased shortness of breath EXAM: PORTABLE CHEST 1 VIEW COMPARISON:  07/18/2020 and prior FINDINGS: Mild hypoinflation. No pneumothorax or pleural effusion. Minimal bibasilar opacities. Diffuse interstitial prominence. Postsurgical appearance of the cardiomediastinal silhouette is unchanged. No acute osseous abnormality. IMPRESSION: Diffuse interstitial prominence and minimal bibasilar opacities, edema versus infection. Electronically Signed   By: Primitivo Gauze M.D.   On: 07/19/2020 07:53   DG Chest Port 1 View  Result Date: 07/18/2020 CLINICAL DATA:  Question sepsis EXAM: PORTABLE CHEST 1 VIEW COMPARISON:  07/17/2020 FINDINGS: Postop CABG.  Heart size normal.  Negative for heart failure. Mild bibasilar atelectasis has developed since yesterday. No effusion. IMPRESSION: Interval development of mild bibasilar atelectasis/infiltrate. Electronically Signed   By: Franchot Gallo M.D.   On: 07/18/2020 10:38    ASSESSMENT: Hairy cell leukemia, neutropenic fever.  PLAN:    1.  Hairy cell leukemia: Patient's most  recent infusion of rituximab was planned for yesterday, but this did not occur secondary to his neutropenic fever.  He has been instructed to keep his previously scheduled follow-up appointment on Tuesday, July 25, 2020 for further evaluation and consideration of treatment. 2.  Neutropenic fever: Likely secondary to underlying pneumonia.  Agree with current antibiotics.  Patient's neutropenia is from his cladribine that he received from November 29 through December 3.  Okay to discharge when fevers have resolved even if patient remains neutropenic. 3.  Anemia: Improved with transfusion.  Maintain hemoglobin greater than 7.0. 4.  Thrombocytopenia: Chronic and unchanged. 5.  Acute acquired pneumonia: Antibiotics as above.   Appreciate consult, will follow.   [name], MD   07/19/2020 2:14 PM

## 2020-07-19 NOTE — ED Notes (Signed)
Admitting MD at bedside to speak with patient and son and provide update to patient and son.

## 2020-07-19 NOTE — ED Notes (Signed)
Assigned attending notified: "Dr. Florene Glen. Pt was on room air all night. Pt had acute desaturation, moments ago and went as low as 72%. I placed pt on 2LPM. Oxygen did not improve significantly. I placed pt on 6LPM and oxygen came up to the 90s. I titrated back to 4LPM via nasal canula. Pts current oxygen saturation is 92-96% on the 4LPM. Pt noted to have bilateral rhonchi breath sounds."

## 2020-07-19 NOTE — Evaluation (Addendum)
Clinical/Bedside Swallow Evaluation Patient Details  Name: Frank Mcdaniel MRN: [id number] Date of Birth: 1933-07-31  Today's Date: 07/19/2020 Time: SLP Start Time (ACUTE ONLY): 0910 SLP Stop Time (ACUTE ONLY): 1010 SLP Time Calculation (min) (ACUTE ONLY): 60 min  Past Medical History:  Past Medical History:  Diagnosis Date  . Anemia 09/01/2015  . Anxiety 09/11/2015  . Arthritis   . Asthma    as a teenager  . Asymptomatic Sinus Bradycardia   . Atypical chest pain    a. 08/2014  . BPH (benign prostatic hyperplasia)   . CKD (chronic kidney disease), stage III ([place])   . Closed fracture of tuft of distal phalanx of finger 08/31/2018  . Coronary artery disease    a. 2000 s/p CABG;  b. 10/2008 Neg MV, EF 64%; c. cath 05/2015: LM 85% sev cal, pLAD-1 lesion 80%, pLAD-2 lesion 100% chronic, ostLCx 70% sev cal, OM4 90%, ostRCA 70%, mid RCA 90%, LIMA-LAD patent, VG-OM2 patent, VG-OM3 patent, VG-RPDA patent. No AS  . Dementia without behavioral disturbance ([place]) 05/28/2019  . GERD (gastroesophageal reflux disease)   . H/O echocardiogram    a. 10/2005 Echo: nl EF.  [name] Hairy cell leukemia ([place])   . HOH (hard of hearing)    Bilateral hearing aids  . HTN (hypertension)   . Hyperlipidemia   . Hypertension   . Leukemia ([place])   . Neutropenic fever ([place]) 11/08/2019  . Stricture and stenosis of esophagus    Past Surgical History:  Past Surgical History:  Procedure Laterality Date  . CARDIAC CATHETERIZATION N/A 05/26/2015   Procedure: Left Heart Cath;  Surgeon: [name], MD;  Location: [place] [facility];  Service: Cardiovascular;  Laterality: N/A;  . CARDIAC CATHETERIZATION N/A 05/26/2015   Procedure: Coronary/Graft Angiography;  Surgeon: [name], MD;  Location: [place] [facility];  Service: Cardiovascular;  Laterality: N/A;  . CATARACT EXTRACTION    . CORONARY ARTERY BYPASS GRAFT  2000  . ESOPHAGOGASTRODUODENOSCOPY N/A 02/18/2017   Procedure: ESOPHAGOGASTRODUODENOSCOPY (EGD)  with removal of food bolus;  Surgeon: [name], MD;  Location[facility] ENDOSCOPY;  Service: Endoscopy;  Laterality: N/A;  . ESOPHAGOGASTRODUODENOSCOPY (EGD) WITH PROPOFOL N/A 03/11/2017   Procedure: ESOPHAGOGASTRODUODENOSCOPY (EGD) WITH PROPOFOL;  Surgeon: [name], MD;  Location: [facility] ENDOSCOPY;  Service: Endoscopy;  Laterality: N/A;  . ESOPHAGOGASTRODUODENOSCOPY (EGD) WITH PROPOFOL N/A 04/15/2017   Procedure: ESOPHAGOGASTRODUODENOSCOPY (EGD) WITH PROPOFOL;  Surgeon: [name], MD;  Location: [facility] ENDOSCOPY;  Service: Endoscopy;  Laterality: N/A;  . EYE SURGERY Left    Cataract Extraction with IOL  . INGUINAL HERNIA REPAIR Left 06/24/2016   Procedure: HERNIA REPAIR INGUINAL ADULT;  Surgeon: [name], MD;  Location: [facility] ORS;  Service: General;  Laterality: Left;  . TONSILLECTOMY  1942   . UPPER GASTROINTESTINAL ENDOSCOPY  3 years ago    with Dilation.    HPI:  Pt is a 86 y.o. male with medical history significant for Dementai, CKD, CAD, anxiety, GERD, hypertension, anemia, history of recurrent hairy cell leukemia status post bone marrow biopsy on 11/02/19 which revealed recurrent hairy cell leukemia.  Patient was started on Rituxan and Cladribine in April, 2021.  It appears he did not receive any further chemotherapy until November, 29, 2021 when he received a dose of Rituxan plus cladribine, cycle 1, day 1.  His son states that he ended up in the hospital after the initial treatment in March.  Per son he has had cough, nasal congestion and shortness of breath for  the past few days.  Symptoms been constant worsening.  Fever today of 101.9.  Has not had any medicines.  Per son he was outside doing yard work yesterday and felt pretty good.  No known Covid exposure.  Has been fully vaccinated with booster.  Pt also has a longstanding h/o Esophageal Stricture/Dilation -- the last time in 2018. Pt has minor c/o of Esophageal dysmotility at home -- any such can increase Esophageal phlegm which  can move into the pharynx.   Assessment / Plan / Recommendation Clinical Impression  This was an abbreviated BSE w/ few po trials given d/t pt's Baseline of ongoing laryngopharyngeal secretions; ongoing phlegm and wet vocal quality at Rest Prior to po's. Pt also has a Baseline of Dementia and required verbal cues for follow through w/ po tasks, expectoation of phlegm. With the limited po trials given, pt appeared to present w/ grossly adequate oropharyngeal phase swallow w/ No immediate oropharyngeal phase dysphagia noted, No overt neuromuscular deficits noted. Pt consumed po trials w/ No immediate, clinical s/s of aspiration during po trials. However, pt exhibited ongoing wet, phlegmy secretions during respirations and had to cough/expectorate phlegm during this session 5x. A strong cough (delayed) was noted x1 post trials of thin liquids -- unsure if added bolus material mixed w/ phlegm and increased the opportunity for aspiration(?). No overt decline in respiratory status noted; vocal quality remained wet throughout and only dry immediately post pt coughing and expectorating phelgm. Pt appears at risk for aspiration of any oral intake d/t the Baseline phlegm in laryngopharynx.  During po trials, pt consumed all consistencies w/ no immediate, overt coughing, or change in respiratory presentation during/post trials. He appeared to give timely attention to each bolus and prompt swallows. Oral phase appeared grossly [facility] w/ timely bolus management, mastication, and control of bolus propulsion for A-P transfer for swallowing. Oral clearing achieved w/ all trial consistencies. OM Exam appeared grossly [facility] w/ no unilateral weakness noted. Oral cavity Dry. Pt helped to feed self w/ support.  Recommend NPO status d/t pt's increased laryngopharyngeal phlegm and inability to manage/decrease the phlegm currently. This was discussed w/ both MD/NSG w/ recommendation for RT to deep suction pt w/ f/u monitoring to see if  the secretions return, or if pt can self-manage them. Dry vocal quality must be obtained b/f introduction of an oral diet; po's. Recommend frequent oral care w/ general aspiration precautions, Meds via alternative means at this time. Education given to Son (present) on the above and general aspiration precautions. ST services will f/u daily w/ pt's status and appropriateness to introduce oral intake; ongoing assessment. MD agreed. SLP Visit Diagnosis: Dysphagia, unspecified (R13.10)    Aspiration Risk  Moderate aspiration risk;Risk for inadequate nutrition/hydration (d/t current laryngopharyngeal phlegm)    Diet Recommendation  NPO d/t current laryngopharyngeal phlegm; frequent oral care for hygiene and stimulation of swallowing; general aspiration precautions  Medication Administration: Via alternative means    Other  Recommendations Recommended Consults: Consider ENT evaluation (potentially; Dietician f/u for support; RT to suction) Oral Care Recommendations: Oral care QID;Staff/trained caregiver to provide oral care Other Recommendations:  (TBD)   Follow up Recommendations  (TBD)      Frequency and Duration min 3x week  2 weeks       Prognosis Prognosis for Safe Diet Advancement: Guarded Barriers to Reach Goals: Cognitive deficits;Language deficits;Severity of deficits      Swallow Study   General Date of Onset: 07/17/20 HPI: Pt is a 86 y.o. male with  medical history significant for Dementai, CKD, CAD, anxiety, GERD, hypertension, anemia, history of recurrent hairy cell leukemia status post bone marrow biopsy on 11/02/19 which revealed recurrent hairy cell leukemia.  Patient was started on Rituxan and Cladribine in April, 2021.  It appears he did not receive any further chemotherapy until November, 29, 2021 when he received a dose of Rituxan plus cladribine, cycle 1, day 1.  His son states that he ended up in the hospital after the initial treatment in March.  Per son he has had  cough, nasal congestion and shortness of breath for the past few days.  Symptoms been constant worsening.  Fever today of 101.9.  Has not had any medicines.  Per son he was outside doing yard work yesterday and felt pretty good.  No known Covid exposure.  Has been fully vaccinated with booster.  Pt also has a longstanding h/o Esophageal Stricture/Dilation -- the last time in 2018. Pt has minor c/o of Esophageal dysmotility at home -- any such can increase Esophageal phlegm which can move into the pharynx. Type of Study: Bedside Swallow Evaluation Previous Swallow Assessment: none noted; EGDs + Diet Prior to this Study: NPO (regular diet per Son at home) Temperature Spikes Noted:  (0.8 wbc; temp increase in last 24hrs) Respiratory Status: Nasal cannula (4L) History of Recent Intubation: No Behavior/Cognition: Alert;Cooperative;Pleasant mood;Confused;Distractible;Requires cueing (baseline Dementia) Oral Cavity Assessment: Dry Oral Care Completed by SLP: Yes Oral Cavity - Dentition: Adequate natural dentition;Missing dentition Vision: Functional for self-feeding Self-Feeding Abilities: Able to feed self;Needs assist;Needs set up Patient Positioning: Upright in bed (needed positioning) Baseline Vocal Quality: Low vocal intensity (mumbled/muttered speech; no words) Volitional Cough: Cognitively unable to elicit Volitional Swallow: Unable to elicit    Oral/Motor/Sensory Function Overall Oral Motor/Sensory Function: Within functional limits (grossly w/ movements; bolus management)   Ice Chips Ice chips: Within functional limits Presentation: Spoon (fed; 3 trials) Other Comments: wet vocal quality ongoing   Thin Liquid Thin Liquid: Impaired Presentation: Cup;Self Fed (supported; 4 trials) Oral Phase Impairments:  (none) Oral Phase Functional Implications:  (none) Pharyngeal  Phase Impairments: Cough - Delayed (x1/4) Other Comments: wet vocal quality ongoing    Nectar Thick Nectar Thick Liquid:  Not tested   Honey Thick Honey Thick Liquid: Not tested   Puree Puree: Within functional limits Presentation: Spoon (fed; 3 trials) Oral Phase Impairments:  (adequate) Oral Phase Functional Implications:  (none) Pharyngeal Phase Impairments:  (none) Other Comments: wet vocal quality ongoing   Solid     Solid: Within functional limits Presentation: Self Fed;Spoon (1 trial) Other Comments: wet vocal quality ongoing        Orinda Kenner, MS, [employer] Speech Language Pathologist Rehab Services [phone] Arnav Cregg 07/19/2020,3:12 PM

## 2020-07-19 NOTE — Consult Note (Signed)
[place]  [phone] [phone]   Name: Frank Mcdaniel Date: 07/19/2020 MRN: [id number]  DOB: 11/13/1932  Patient Care Team: [name], MD as PCP - General (Family Medicine) Frank Situ Kathlene November, MD as PCP - Cardiology (Cardiology) [name], MD as Consulting Physician (Cardiology) [name], MD as Consulting Physician (Oncology) [name] as Physician Assistant (Urology)    REASON FOR CONSULTATION: Frank Mcdaniel is a 86 y.o. male with multiple medical problems including anxiety, GERD, hypertension, anemia, and history of recurrent hairy cell leukemia on systemic chemo with Rituxan plus cladribine.  Patient was admitted to the hospital on 07/18/2020 with neutropenic fever and acute on chronic anemia.  Chest x-ray showed bibasilar infiltrates.  Palliative care was consulted to address goals.  SOCIAL HISTORY:     reports that he has never smoked. He has never used smokeless tobacco. He reports that he does not drink alcohol and does not use drugs.  Patient is a widower.  He lives at home with his son.  He worked for the [employer] as a Psychologist, counselling.  ADVANCE DIRECTIVES:  On file  CODE STATUS: DNR  PAST MEDICAL HISTORY: Past Medical History:  Diagnosis Date  . Anemia 09/01/2015  . Anxiety 09/11/2015  . Arthritis   . Asthma    as a teenager  . Asymptomatic Sinus Bradycardia   . Atypical chest pain    a. 08/2014  . BPH (benign prostatic hyperplasia)   . CKD (chronic kidney disease), stage III ([place])   . Closed fracture of tuft of distal phalanx of finger 08/31/2018  . Coronary artery disease    a. 2000 s/p CABG;  b. 10/2008 Neg MV, EF 64%; c. cath 05/2015: LM 85% sev cal, pLAD-1 lesion 80%, pLAD-2 lesion 100% chronic, ostLCx 70% sev cal, OM4 90%, ostRCA 70%, mid RCA 90%, LIMA-LAD patent, VG-OM2 patent, VG-OM3 patent, VG-RPDA patent.  No AS  . Dementia without behavioral disturbance ([place]) 05/28/2019  . GERD (gastroesophageal reflux disease)   . H/O echocardiogram    a. 10/2005 Echo: nl EF.  Frank Kitchen Hairy cell leukemia ([place])   . HOH (hard of hearing)    Bilateral hearing aids  . HTN (hypertension)   . Hyperlipidemia   . Hypertension   . Leukemia (Frank Mcdaniel)   . Neutropenic fever ([place]) 11/08/2019  . Stricture and stenosis of esophagus     PAST SURGICAL HISTORY:  Past Surgical History:  Procedure Laterality Date  . CARDIAC CATHETERIZATION N/A 05/26/2015   Procedure: Left Heart Cath;  Surgeon: [name], MD;  Location: [facility];  Service: Cardiovascular;  Laterality: N/A;  . CARDIAC CATHETERIZATION N/A 05/26/2015   Procedure: Coronary/Graft Angiography;  Surgeon: [name], MD;  Location: [facility];  Service: Cardiovascular;  Laterality: N/A;  . CATARACT EXTRACTION    . CORONARY ARTERY BYPASS GRAFT  2000  . ESOPHAGOGASTRODUODENOSCOPY N/A 02/18/2017   Procedure: ESOPHAGOGASTRODUODENOSCOPY (EGD) with removal of food bolus;  Surgeon: [name], MD;  Location: [facility] ENDOSCOPY;  Service: Endoscopy;  Laterality: N/A;  . ESOPHAGOGASTRODUODENOSCOPY (EGD) WITH PROPOFOL N/A 03/11/2017   Procedure: ESOPHAGOGASTRODUODENOSCOPY (EGD) WITH PROPOFOL;  Surgeon: [name], MD;  Location: [facility] ENDOSCOPY;  Service: Endoscopy;  Laterality: N/A;  . ESOPHAGOGASTRODUODENOSCOPY (EGD) WITH PROPOFOL N/A 04/15/2017   Procedure: ESOPHAGOGASTRODUODENOSCOPY (EGD) WITH PROPOFOL;  Surgeon: [name], MD;  Location: [facility] ENDOSCOPY;  Service: Endoscopy;  Laterality: N/A;  . EYE  SURGERY Left    Cataract Extraction with IOL  . INGUINAL HERNIA REPAIR Left 06/24/2016   Procedure: HERNIA REPAIR INGUINAL ADULT;  Surgeon: [name], MD;  Location: [facility] ORS;  Service: General;  Laterality: Left;  . TONSILLECTOMY  1942   . UPPER GASTROINTESTINAL ENDOSCOPY  3 years ago    with Dilation.     HEMATOLOGY/ONCOLOGY HISTORY:   Oncology History  Hairy cell leukemia (Frank Mcdaniel)  01/28/2015 Initial Diagnosis   Hairy cell leukemia (Frank Mcdaniel)   11/08/2019 -  Chemotherapy   The patient had cladribine (LEUSTATIN) 8 mg in sodium chloride 0.9 % 250 mL chemo infusion, 0.11 mg/kg = 11 mg, Intravenous,  Once, 1 of 1 cycle Dose modification: 0.11 mg/kg (original dose 0.11 mg/kg, Cycle 1, Reason: Change in SCr/CrCl) Administration: 8 mg (11/08/2019), 8 mg (07/03/2020), 8 mg (07/04/2020), 8 mg (07/05/2020), 8 mg (07/06/2020), 8 mg (07/07/2020)  for chemotherapy treatment.    11/08/2019 -  Chemotherapy   The patient had riTUXimab-pvvr (RUXIENCE) 700 mg in sodium chloride 0.9 % 250 mL (2.1875 mg/mL) infusion, 375 mg/m2 = 700 mg, Intravenous,  Once, 3 of 9 cycles Administration: 700 mg (11/08/2019), 700 mg (07/03/2020)  for chemotherapy treatment.      ALLERGIES:  has No Known Allergies.  MEDICATIONS:  Current Facility-Administered Medications  Medication Dose Route Frequency Provider Last Rate Last Admin  . 0.9 %  sodium chloride infusion (Manually program via Guardrails IV Fluids)   Intravenous Once Agbata, Tochukwu, MD      . 0.9 %  sodium chloride infusion (Manually program via Guardrails IV Fluids)   Intravenous Once Elodia Florence., MD      . 0.9 %  sodium chloride infusion   Intravenous Continuous Agbata, Tochukwu, MD 100 mL/hr at 07/19/20 0727 Infusion Verify at 07/19/20 0727  . acetaminophen (TYLENOL) tablet 650 mg  650 mg Oral Q6H PRN Agbata, Tochukwu, MD   650 mg at 07/18/20 1541   Or  . acetaminophen (TYLENOL) suppository 650 mg  650 mg Rectal Q6H PRN Agbata, Tochukwu, MD      . albuterol (PROVENTIL) (2.5 MG/3ML) 0.083% nebulizer solution 2.5 mg  2.5 mg Nebulization Q6H Elodia Florence., MD      . atorvastatin (LIPITOR) tablet 20 mg  20 mg Oral Daily Agbata, Tochukwu, MD   20 mg at 07/19/20 1355  . ceFEPIme (MAXIPIME) 2 g in sodium chloride 0.9 % 100 mL IVPB  2 g Intravenous Q12H Collier Bullock, MD   Stopped at 07/19/20  0047  . donepezil (ARICEPT) tablet 10 mg  10 mg Oral QHS Agbata, Tochukwu, MD   10 mg at 07/18/20 2134  . finasteride (PROSCAR) tablet 5 mg  5 mg Oral Daily Agbata, Tochukwu, MD   5 mg at 07/19/20 1356  . haloperidol (HALDOL) tablet 1 mg  1 mg Oral Q6H PRN Elodia Florence., MD       Or  . haloperidol lactate (HALDOL) injection 1 mg  1 mg Intramuscular Q6H PRN Elodia Florence., MD      . isosorbide mononitrate (IMDUR) 24 hr tablet 30 mg  30 mg Oral Daily Agbata, Tochukwu, MD   30 mg at 07/19/20 1358  . multivitamin with minerals tablet 1 tablet  1 tablet Oral Daily Agbata, Tochukwu, MD   1 tablet at 07/19/20 1358  . ondansetron (ZOFRAN) tablet 4 mg  4 mg Oral Q6H PRN Agbata, Tochukwu, MD       Or  . ondansetron (ZOFRAN) injection  4 mg  4 mg Intravenous Q6H PRN Agbata, Tochukwu, MD      . tamsulosin (FLOMAX) capsule 0.8 mg  0.8 mg Oral QHS Agbata, Tochukwu, MD   0.8 mg at 07/18/20 2133    VITAL SIGNS: BP (!) 110/97 (BP Location: Right Arm)   Pulse 84   Temp 99.3 F (37.4 C) (Oral)   Resp (!) 29 Comment: Correction made. Merleen Nicely, NT  Ht 5' 5" (1.651 m)   Wt 167 lb 12.3 oz (76.1 kg)   SpO2 100%   BMI 27.92 kg/m  Filed Weights   07/18/20 0935  Weight: 167 lb 12.3 oz (76.1 kg)    Estimated body mass index is 27.92 kg/m as calculated from the following:   Height as of this encounter: 5' 5" (1.651 m).   Weight as of this encounter: 167 lb 12.3 oz (76.1 kg).  LABS: CBC:    Component Value Date/Time   WBC 0.8 (LL) 07/19/2020 0132   WBC 0.8 (LL) 07/19/2020 0132   HGB 6.7 (L) 07/19/2020 0132   HGB 6.7 (L) 07/19/2020 0132   HGB 9.4 (L) 05/11/2015 1505   HCT 20.0 (L) 07/19/2020 0132   HCT 20.7 (L) 07/19/2020 0132   HCT 29.3 (L) 05/11/2015 1505   PLT 75 (L) 07/19/2020 0132   PLT 74 (L) 07/19/2020 0132   PLT 172 05/11/2015 1505   MCV 101.0 (H) 07/19/2020 0132   MCV 103.0 (H) 07/19/2020 0132   MCV 109 (H) 05/11/2015 1505   MCV 112 (H) 12/01/2014 1421   NEUTROABS 0.8 (L)  07/19/2020 0132   NEUTROABS 1.7 12/01/2014 1421   LYMPHSABS 0.0 (L) 07/19/2020 0132   LYMPHSABS 0.7 (L) 12/01/2014 1421   MONOABS 0.0 (L) 07/19/2020 0132   MONOABS 0.1 (L) 12/01/2014 1421   EOSABS 0.0 07/19/2020 0132   EOSABS 0.1 12/01/2014 1421   BASOSABS 0.0 07/19/2020 0132   BASOSABS 0.0 12/01/2014 1421   Comprehensive Metabolic Panel:    Component Value Date/Time   NA 138 07/19/2020 0132   NA 143 04/18/2016 1624   NA 141 09/01/2014 1124   K 3.5 07/19/2020 0132   K 4.0 09/01/2014 1124   CL 108 07/19/2020 0132   CL 110 (H) 09/01/2014 1124   CO2 21 (L) 07/19/2020 0132   CO2 26 09/01/2014 1124   BUN 25 (H) 07/19/2020 0132   BUN 16 04/18/2016 1624   BUN 18 09/01/2014 1124   CREATININE 1.37 (H) 07/19/2020 0132   CREATININE 1.23 11/24/2014 1415   GLUCOSE 111 (H) 07/19/2020 0132   GLUCOSE 90 09/01/2014 1124   CALCIUM 8.3 (L) 07/19/2020 0132   CALCIUM 8.8 09/01/2014 1124   AST 30 07/19/2020 0132   AST 33 09/01/2014 1124   ALT 15 07/19/2020 0132   ALT 15 09/01/2014 1124   ALKPHOS 46 07/19/2020 0132   ALKPHOS 62 09/01/2014 1124   BILITOT 2.6 (H) 07/19/2020 0132   BILITOT 0.9 09/01/2014 1124   PROT 6.1 (L) 07/19/2020 0132   PROT 7.0 09/01/2014 1124   ALBUMIN 3.3 (L) 07/19/2020 0132   ALBUMIN 3.9 09/01/2014 1124    RADIOGRAPHIC STUDIES: DG Chest 2 View  Result Date: 07/17/2020 CLINICAL DATA:  Cough, fever, shortness of breath. EXAM: CHEST - 2 VIEW COMPARISON:  Radiograph and CT 11/08/2019 FINDINGS: Post median sternotomy and CABG.The cardiomediastinal contours are normal. There is bronchial thickening. Pulmonary vasculature is normal. No consolidation, pleural effusion, or pneumothorax. Exaggerated thoracic kyphosis with multilevel degenerative change in the thoracic spine. No acute osseous abnormalities are  seen. Please note the anterior aspect of the sternum is not included in the field of view on the lateral. IMPRESSION: 1. Bronchial thickening without pneumonia. 2. Post  CABG. Electronically Signed   By: Keith Rake M.D.   On: 07/17/2020 17:03   DG Chest Port 1 View  Result Date: 07/19/2020 CLINICAL DATA:  Increased shortness of breath EXAM: PORTABLE CHEST 1 VIEW COMPARISON:  07/18/2020 and prior FINDINGS: Mild hypoinflation. No pneumothorax or pleural effusion. Minimal bibasilar opacities. Diffuse interstitial prominence. Postsurgical appearance of the cardiomediastinal silhouette is unchanged. No acute osseous abnormality. IMPRESSION: Diffuse interstitial prominence and minimal bibasilar opacities, edema versus infection. Electronically Signed   By: Primitivo Gauze M.D.   On: 07/19/2020 07:53   DG Chest Port 1 View  Result Date: 07/18/2020 CLINICAL DATA:  Question sepsis EXAM: PORTABLE CHEST 1 VIEW COMPARISON:  07/17/2020 FINDINGS: Postop CABG.  Heart size normal.  Negative for heart failure. Mild bibasilar atelectasis has developed since yesterday. No effusion. IMPRESSION: Interval development of mild bibasilar atelectasis/infiltrate. Electronically Signed   By: Franchot Gallo M.D.   On: 07/18/2020 10:38    PERFORMANCE STATUS (ECOG) : 2 - Symptomatic, <50% confined to bed  Review of Systems Unless otherwise noted, a complete review of systems is negative.  Physical Exam Deferred  IMPRESSION: Met with patient to discuss goals.  I introduced palliative care services and attempted to establish therapeutic rapport.  Patient feels slightly improved today since his admission to the hospital yesterday.  However, he continues to have cough and shortness of breath.  He is being treated with empiric antibiotics for presumed acute bronchitis versus pneumonia.  Patient says that prior to this hospitalization, he was doing well at home.  He is functionally independent with his care.  Patient would be interested in continued treatments if options are available.  However, he is in agreement that further goals and treatment options can be discussed outpatient  following resolution of his acute medical problems.   With patient's permission, I also called and spoke with his son.  I updated him on patient's current medical problems.  Son also felt the patient was doing well prior to this hospitalization.  He is hopeful the patient will return to baseline but understands that his age and underlying frailty might ultimately result in a change in treatment decisions.  Son is also in agreement with follow-up in the [place] for further discussion.  PLAN: -Continue current scope of treatment -Will plan follow up in the [place]   Time Total: 60 minutes  Visit consisted of counseling and education dealing with the complex and emotionally intense issues of symptom management and palliative care in the setting of serious and potentially life-threatening illness.Greater than 50%  of this time was spent counseling and coordinating care related to the above assessment and plan.  Signed by: Altha Harm, PhD, NP-C

## 2020-07-20 ENCOUNTER — Inpatient Hospital Stay: Payer: PPO

## 2020-07-20 LAB — CBC WITH DIFFERENTIAL/PLATELET
Abs Immature Granulocytes: 0 10*3/uL (ref 0.00–0.07)
Basophils Absolute: 0 10*3/uL (ref 0.0–0.1)
Basophils Relative: 0 %
Eosinophils Absolute: 0 10*3/uL (ref 0.0–0.5)
Eosinophils Relative: 1 %
HCT: 24.5 % — ABNORMAL LOW (ref 39.0–52.0)
Hemoglobin: 8.4 g/dL — ABNORMAL LOW (ref 13.0–17.0)
Immature Granulocytes: 0 %
Lymphocytes Relative: 5 %
Lymphs Abs: 0 10*3/uL — ABNORMAL LOW (ref 0.7–4.0)
MCH: 33.1 pg (ref 26.0–34.0)
MCHC: 34.3 g/dL (ref 30.0–36.0)
MCV: 96.5 fL (ref 80.0–100.0)
Monocytes Absolute: 0 10*3/uL — ABNORMAL LOW (ref 0.1–1.0)
Monocytes Relative: 4 %
Neutro Abs: 0.7 10*3/uL — ABNORMAL LOW (ref 1.7–7.7)
Neutrophils Relative %: 90 %
Platelets: 78 10*3/uL — ABNORMAL LOW (ref 150–400)
RBC: 2.54 MIL/uL — ABNORMAL LOW (ref 4.22–5.81)
RDW: 18.6 % — ABNORMAL HIGH (ref 11.5–15.5)
Smear Review: NORMAL
WBC: 0.8 10*3/uL — CL (ref 4.0–10.5)
nRBC: 0 % (ref 0.0–0.2)

## 2020-07-20 LAB — COMPREHENSIVE METABOLIC PANEL
ALT: 16 U/L (ref 0–44)
AST: 34 U/L (ref 15–41)
Albumin: 3.2 g/dL — ABNORMAL LOW (ref 3.5–5.0)
Alkaline Phosphatase: 47 U/L (ref 38–126)
Anion gap: 8 (ref 5–15)
BUN: 24 mg/dL — ABNORMAL HIGH (ref 8–23)
CO2: 26 mmol/L (ref 22–32)
Calcium: 8.3 mg/dL — ABNORMAL LOW (ref 8.9–10.3)
Chloride: 108 mmol/L (ref 98–111)
Creatinine, Ser: 1.2 mg/dL (ref 0.61–1.24)
GFR, Estimated: 59 mL/min — ABNORMAL LOW (ref >60)
Glucose, Bld: 105 mg/dL — ABNORMAL HIGH (ref 70–99)
Potassium: 3.1 mmol/L — ABNORMAL LOW (ref 3.5–5.1)
Sodium: 142 mmol/L (ref 135–145)
Total Bilirubin: 2.7 mg/dL — ABNORMAL HIGH (ref 0.3–1.2)
Total Protein: 6.3 g/dL — ABNORMAL LOW (ref 6.5–8.1)

## 2020-07-20 LAB — BPAM RBC
Blood Product Expiration Date: 202201072359
Blood Product Expiration Date: 202201112359
ISSUE DATE / TIME: 202112141302
ISSUE DATE / TIME: 202112151556
Unit Type and Rh: 5100
Unit Type and Rh: 6200

## 2020-07-20 LAB — TYPE AND SCREEN
ABO/RH(D): A POS
Antibody Screen: NEGATIVE
Unit division: 0
Unit division: 0

## 2020-07-20 LAB — LEGIONELLA PNEUMOPHILA SEROGP 1 UR AG: L. pneumophila Serogp 1 Ur Ag: NEGATIVE

## 2020-07-20 LAB — MAGNESIUM: Magnesium: 2.3 mg/dL (ref 1.7–2.4)

## 2020-07-20 LAB — HEMOGLOBIN AND HEMATOCRIT, BLOOD
HCT: 24.8 % — ABNORMAL LOW (ref 39.0–52.0)
Hemoglobin: 8.7 g/dL — ABNORMAL LOW (ref 13.0–17.0)

## 2020-07-20 LAB — PHOSPHORUS: Phosphorus: 2.6 mg/dL (ref 2.5–4.6)

## 2020-07-20 MED ORDER — POTASSIUM CHLORIDE 10 MEQ/100ML IV SOLN
10.0000 meq | INTRAVENOUS | Status: AC
  Administered 2020-07-20 (×2): 10 meq via INTRAVENOUS
  Filled 2020-07-20 (×2): qty 100

## 2020-07-20 MED ORDER — SODIUM CHLORIDE 0.9 % IV SOLN
3.0000 g | Freq: Three times a day (TID) | INTRAVENOUS | Status: DC
  Administered 2020-07-20 – 2020-07-22 (×6): 3 g via INTRAVENOUS
  Filled 2020-07-20 (×4): qty 8
  Filled 2020-07-20: qty 3
  Filled 2020-07-20 (×2): qty 8
  Filled 2020-07-20: qty 3

## 2020-07-20 MED ORDER — PANTOPRAZOLE SODIUM 40 MG IV SOLR
40.0000 mg | INTRAVENOUS | Status: DC
  Administered 2020-07-20 – 2020-07-24 (×5): 40 mg via INTRAVENOUS
  Filled 2020-07-20 (×5): qty 40

## 2020-07-20 MED ORDER — FUROSEMIDE 10 MG/ML IJ SOLN
60.0000 mg | Freq: Once | INTRAMUSCULAR | Status: AC
  Administered 2020-07-20: 07:00:00 60 mg via INTRAVENOUS
  Filled 2020-07-20: qty 8

## 2020-07-20 MED ORDER — POTASSIUM CHLORIDE 10 MEQ/100ML IV SOLN
10.0000 meq | INTRAVENOUS | Status: AC
  Administered 2020-07-20 (×2): 10 meq via INTRAVENOUS
  Filled 2020-07-20 (×2): qty 100

## 2020-07-20 MED ORDER — DEXTROSE IN LACTATED RINGERS 5 % IV SOLN: INTRAVENOUS | Status: DC

## 2020-07-20 MED ORDER — ALBUMIN HUMAN 25 % IV SOLN
25.0000 g | INTRAVENOUS | Status: AC
  Administered 2020-07-20: 07:00:00 25 g via INTRAVENOUS
  Filled 2020-07-20: qty 100

## 2020-07-20 NOTE — Plan of Care (Signed)
  Problem: Education: Goal: Knowledge of General Education information will improve Description: Including pain rating scale, medication(s)/side effects and non-pharmacologic comfort measures 07/20/2020 1829 by Cristela Blue, RN Outcome: Progressing 07/20/2020 [place] by Cristela Blue, RN Outcome: Progressing   Problem: Health Behavior/Discharge Planning: Goal: Ability to manage health-related needs will improve 07/20/2020 1829 by Cristela Blue, RN Outcome: Progressing 07/20/2020 [place] by Cristela Blue, RN Outcome: Progressing   Problem: Clinical Measurements: Goal: Ability to maintain clinical measurements within normal limits will improve 07/20/2020 1829 by Cristela Blue, RN Outcome: Progressing 07/20/2020 1829 by Cristela Blue, RN Outcome: Progressing Goal: Will remain free from infection 07/20/2020 1829 by Cristela Blue, RN Outcome: Progressing 07/20/2020 [place] by Cristela Blue, RN Outcome: Progressing Goal: Diagnostic test results will improve 07/20/2020 1829 by Cristela Blue, RN Outcome: Progressing 07/20/2020 [place] by Cristela Blue, RN Outcome: Progressing Goal: Respiratory complications will improve 07/20/2020 1829 by Cristela Blue, RN Outcome: Progressing 07/20/2020 [place] by Cristela Blue, RN Outcome: Progressing Goal: Cardiovascular complication will be avoided 07/20/2020 1829 by Cristela Blue, RN Outcome: Progressing 07/20/2020 1829 by Cristela Blue, RN Outcome: Progressing   Problem: Activity: Goal: Risk for activity intolerance will decrease 07/20/2020 1829 by Cristela Blue, RN Outcome: Progressing 07/20/2020 1829 by Cristela Blue, RN Outcome: Progressing   Problem: Nutrition: Goal: Adequate nutrition will be maintained 07/20/2020 1829 by Cristela Blue, RN Outcome: Progressing 07/20/2020 1829 by Cristela Blue, RN Outcome: Progressing   Problem: Coping: Goal: Level of anxiety will decrease 07/20/2020 1829 by Cristela Blue, RN Outcome:  Progressing 07/20/2020 1829 by Cristela Blue, RN Outcome: Progressing   Problem: Elimination: Goal: Will not experience complications related to bowel motility 07/20/2020 1829 by Cristela Blue, RN Outcome: Progressing 07/20/2020 1829 by Cristela Blue, RN Outcome: Progressing Goal: Will not experience complications related to urinary retention 07/20/2020 1829 by Cristela Blue, RN Outcome: Progressing 07/20/2020 [place] by Cristela Blue, RN Outcome: Progressing   Problem: Pain Managment: Goal: General experience of comfort will improve 07/20/2020 1829 by Cristela Blue, RN Outcome: Progressing 07/20/2020 1829 by Cristela Blue, RN Outcome: Progressing   Problem: Safety: Goal: Ability to remain free from injury will improve 07/20/2020 1829 by Cristela Blue, RN Outcome: Progressing 07/20/2020 [place] by Cristela Blue, RN Outcome: Progressing   Problem: Skin Integrity: Goal: Risk for impaired skin integrity will decrease 07/20/2020 1829 by Cristela Blue, RN Outcome: Progressing 07/20/2020 1829 by Cristela Blue, RN Outcome: Progressing

## 2020-07-20 NOTE — Progress Notes (Addendum)
PROGRESS NOTE    Frank Mcdaniel  XAJ:[id number] DOB: [birth year] DOA: 07/18/2020 PCP: Frank Haven, MD   Chief Complaint  Patient presents with  . Neutropenia    Brief Narrative: Frank Mcdaniel is Frank Mcdaniel 86 y.o. male with medical history significant for anxiety, GERD, hypertension, anemia, history of recurrent hairy cell leukemia status post bone marrow biopsy on 11/02/19 which revealed recurrent hairy cell leukemia.  Patient was started on Rituxan and Cladribine in April, 2021.  It appears he did not receive any further chemotherapy until November, 29, 2021 when he received Frank Mcdaniel dose of Rituxan plus cladribine, cycle 1, day 1.  His son states that he ended up in the hospital after the initial treatment in March. About 2 days prior to presenting to the emergency room he developed Frank Mcdaniel fever with Frank Mcdaniel T-max of 101 Mcdaniel and Frank Mcdaniel wet sounding cough which is productive of occasional clear phlegm and was seen at the urgent care center where he was started on antibiotics for presumed acute bronchitis.  Patient went to the cancer center on the day of his admission for chemotherapy infusion and labs showed neutropenia.  He was also febrile with Frank Mcdaniel T-max of 100.7 Mcdaniel.  He was sent to the emergency room for further evaluation. He denies having any chest pain, no shortness of breath, no nausea, no vomiting no abdominal pain, no headache, no dizziness, no lightheadedness, no sore throat. Labs show sodium 134, potassium 4.3, chloride 104, bicarb 22, glucose 133, BUN 26, creatinine 1.34 calcium 8.4, alkaline phosphatase 47, albumin 3.3, AST 34, ALT 12, total protein 6.3, total bili 2.0, BNP 158, lactic acid 1.6, white cell count 0.7, hemoglobin 5.5, hematocrit 17, MCV 103, RDW 18.6, platelet count 71 Respiratory viral panel is negative Chest x ray reviewed by me shows mild bibasilar atelectasis/infiltrate. Twelve-lead EKG shows sinus rhythm, LVH and right bundle branch block  ED Course: Patient is an 86 year old male  sent from the cancer center to the emergency room for evaluation of Frank Mcdaniel.  Patient has Frank Mcdaniel history of recurrent hairy cell leukemia and was recently started back on chemotherapy.  He was scheduled for an infusion on the day of admission but was sent to the ER due to marked neutropenia and fever.  He received Frank Mcdaniel dose of vancomycin and cefepime in the ER and will be admitted to the hospital for further evaluation.  Assessment & Plan:   Principal Problem:   Febrile neutropenia (HCC) Active Problems:   HTN (hypertension)   Hairy cell leukemia (HCC)   Anemia   Anxiety   Dementia without behavioral disturbance (HCC)   Pancytopenia (HCC)   CKD (chronic kidney disease), stage III (HCC)   Palliative care encounter  Febrile Neutropenia Community Acquired Pneumonia Enterococcus Faecalis UTI Last fever 12/14 ~1719 (temp to 100 on 12/15 PM) ANC 0.8 today Blood cx with staph hominis in 1 of 4 bottles - suspect this is contaminant  MRSA PCR negative.  Follow sputum cx if able. UA not concerning for UTI.  Urine cx with enterococcus faecalis, treat in setting of febrile neutropenia - follow cx for susceptibility. Continue cefepime -> narrow to unasyn to cover enterococcus and pneumonia (follow susceptibilities) Oncology c/s, appreciate recommendations - they note 2/2 clardribine  Acute Hypoxic Respiratory Failure Improved, currently on RA CXR with diffuse interstitial prominence and minimal bibasilar opacities - edema vs infection Will give dose of lasix with blood today Echo 2021 with EF 09-47%, grade 1 diastolic  dysfunction, see report SLP eval - thick secretions -> will request RT for deep suction - NPO for now (continues to have copious secretions today, will follow CT chest)  Hypokalemia: replace and follow  Pancytopenia Multifactorial and secondary to hairy cell leukemia as well as chemotherapy-induced S/p 2 unit pRBC  Monitor platelet count closely no indication  for platelet transfusion at this time Appreciate oncology recs   Recurrent hairy cell leukemia On chemotherapy with oncology Follow outpatient with oncology  Coronary artery disease Continue nitrates as much as blood pressure tolerates Hold aspirin for now due to thrombocytopenia Continue statins  BPH Continue Flomax and finasteride  Dementia Continue Aricept  Chronic kidney disease, stage IIIa Renal function is stable We will monitor closely  Goals of care: palliative care c/s requested   DVT prophylaxis: SCD Code Status: DNR Family Communication: son at bedside Disposition:   Status is: Inpatient  Remains inpatient appropriate because:Inpatient level of care appropriate due to severity of illness   Dispo: The patient is from: Home              Anticipated d/c is to: pending              Anticipated d/c date is: > 3 days              Patient currently is not medically stable to d/c.     Consultants:   Palliative care  Procedures: none  Antimicrobials: Anti-infectives (From admission, onward)   Start     Dose/Rate Route Frequency Ordered Stop   07/18/20 2200  ceFEPIme (MAXIPIME) 2 g in sodium chloride 0.9 % 100 mL IVPB        2 g 200 mL/hr over 30 Minutes Intravenous Every 12 hours 07/18/20 1236     07/18/20 1115  vancomycin (VANCOREADY) IVPB 1500 mg/300 mL        1,500 mg 150 mL/hr over 120 Minutes Intravenous  Once 07/18/20 1037 07/18/20 1359   07/18/20 1015  ceFEPIme (MAXIPIME) 2 g in sodium chloride 0.9 % 100 mL IVPB        2 g 200 mL/hr over 30 Minutes Intravenous  Once 07/18/20 1008 07/18/20 1153   07/18/20 1015  vancomycin (VANCOCIN) IVPB 1000 mg/200 mL premix  Status:  Discontinued        1,000 mg 200 mL/hr over 60 Minutes Intravenous  Once 07/18/20 1008 07/18/20 1037     Subjective: No complaints again today Son at bedside, thinks he's feeling Frank Mcdaniel little better  Objective: Vitals:   07/20/20 0305 07/20/20 0536 07/20/20 0746 07/20/20  1142  BP:  (!) 119/54 (!) 119/50 120/65  Pulse:  95 89 92  Resp:   19 18  Temp:  98.9 Mcdaniel (37.2 C) 99.5 Mcdaniel (37.5 C) 99.1 Mcdaniel (37.3 C)  TempSrc:  Oral Oral Oral  SpO2: 97% 99% 99% 98%  Weight:      Height:        Intake/Output Summary (Last 24 hours) at 07/20/2020 1451 Last data filed at 07/20/2020 0837 Gross per 24 hour  Intake 967.95 ml  Output 1100 ml  Net -132.05 ml   Filed Weights   07/18/20 0935  Weight: 76.1 kg    Examination:  General: No acute distress. Cardiovascular: Heart sounds show Meleah Demeyer regular rate, and rhythm.  Lungs: bilateral coarse breath sounds throughout Abdomen: Soft, nontender, nondistended Neurological: Alert and oriented 3. Moves all extremities 4 . Cranial nerves II through XII grossly intact. Skin: Warm and dry. No rashes or  lesions. Extremities: No clubbing or cyanosis. No edema.   Data Reviewed: I have personally reviewed following labs and imaging studies  CBC: Recent Labs  Lab 07/18/20 0830 07/18/20 1007 07/19/20 0132 07/20/20 0028 07/20/20 0509  WBC 0.8* 0.7* 0.8*  0.8*  --  0.8*  NEUTROABS 0.7* 0.7* 0.8*  --  0.7*  HGB 6.2* 5.5* 6.7*  6.7* 8.7* 8.4*  HCT 18.8* 17.0* 20.7*  20.0* 24.8* 24.5*  MCV 102.7* 103.0* 103.0*  101.0*  --  96.5  PLT 83* 71* 74*  75*  --  78*    Basic Metabolic Panel: Recent Labs  Lab 07/18/20 0830 07/18/20 1007 07/19/20 0132 07/20/20 0509  NA 134* 134* 138 142  K 3.6 4.3 3.5 3.1*  CL 102 104 108 108  CO2 19* 22 21* 26  GLUCOSE 153* 133* 111* 105*  BUN 24* 26* 25* 24*  CREATININE 1.43* 1.34* 1.37* 1.20  CALCIUM 8.5* 8.4* 8.3* 8.3*  MG  --   --   --  2.3  PHOS  --   --   --  2.6    GFR: Estimated Creatinine Clearance: 42.8 mL/min (by C-G formula based on SCr of 1.2 mg/dL).  Liver Function Tests: Recent Labs  Lab 07/18/20 1007 07/19/20 0132 07/20/20 0509  AST 34 30 34  ALT 12 15 16   ALKPHOS 47 46 47  BILITOT 2.0* 2.6* 2.7*  PROT 6.3* 6.1* 6.3*  ALBUMIN 3.3* 3.3* 3.2*     CBG: No results for input(s): GLUCAP in the last 168 hours.   Recent Results (from the past 240 hour(s))  COVID-19, Flu Dylyn Mclaren+B and RSV ([facility])     Status: None   Collection Time: 07/17/20  5:24 PM   Specimen: Nasopharyngeal Swab  Result Value Ref Range Status   SARS-CoV-2, NAA Not Detected Not Detected Final    Comment: This nucleic acid amplification test was developed and its performance characteristics determined by [employer]. Nucleic acid amplification tests include RT-PCR and TMA. This test has not been FDA cleared or approved. This test has been authorized by FDA under an Emergency Use Authorization (EUA). This test is only authorized for the duration of time the declaration that circumstances exist justifying the authorization of the emergency use of in vitro diagnostic tests for detection of SARS-CoV-2 virus and/or diagnosis of COVID-19 infection under section 564(b)(1) of the Act, 21 U.S.C. 309MMH-6(K) (1), unless the authorization is terminated or revoked sooner. When diagnostic testing is negative, the possibility of Frank Mcdaniel false negative result should be considered in the context of Frank Mcdaniel patient's recent exposures and the presence of clinical signs and symptoms consistent with COVID-19. An individual without symptoms of COVID-19 and who is not shedding SARS-CoV-2 virus wo uld expect to have Frank Hart negative (not detected) result in this assay.    Influenza Jed Kutch, NAA Not Detected Not Detected Final   Influenza B, NAA Not Detected Not Detected Final   RSV, NAA Not Detected Not Detected Final  Blood Culture (routine x 2)     Status: None (Preliminary result)   Collection Time: 07/18/20 10:10 AM   Specimen: BLOOD  Result Value Ref Range Status   Specimen Description BLOOD BLOOD RIGHT FOREARM  Final   Special Requests   Final    BOTTLES DRAWN AEROBIC AND ANAEROBIC Blood Culture adequate volume   Culture   Final    NO GROWTH 2 DAYS Performed at [facility], [address], [place], [place] [address]    Report Status PENDING  Incomplete  Resp Panel by RT-PCR (Flu Cline Draheim&B, Covid) Nasopharyngeal Swab     Status: None   Collection Time: 07/18/20 10:10 AM   Specimen: Nasopharyngeal Swab; Nasopharyngeal(NP) swabs in vial transport medium  Result Value Ref Range Status   SARS Coronavirus 2 by RT PCR NEGATIVE NEGATIVE Final    Comment: (NOTE) SARS-CoV-2 target nucleic acids are NOT DETECTED.  The SARS-CoV-2 RNA is generally detectable in upper respiratory specimens during the acute phase of infection. The lowest concentration of SARS-CoV-2 viral copies this assay can detect is 138 copies/mL. Petrina Melby negative result does not preclude SARS-Cov-2 infection and should not be used as the sole basis for treatment or other patient management decisions. Frank Mcdaniel negative result may occur with  improper specimen collection/handling, submission of specimen other than nasopharyngeal swab, presence of viral mutation(s) within the areas targeted by this assay, and inadequate number of viral copies(<138 copies/mL). Frank Mcdaniel negative result must be combined with clinical observations, patient history, and epidemiological information. The expected result is Negative.  Fact Sheet for Patients:  EntrepreneurPulse.com.au  Fact Sheet for Healthcare Providers:  IncredibleEmployment.be  This test is no t yet approved or cleared by the [place] FDA and  has been authorized for detection and/or diagnosis of SARS-CoV-2 by FDA under an Emergency Use Authorization (EUA). This EUA will remain  in effect (meaning this test can be used) for the duration of the COVID-19 declaration under Section 564(b)(1) of the Act, 21 U.S.C.section 360bbb-3(b)(1), unless the authorization is terminated  or revoked sooner.       Influenza Aletha Allebach by PCR NEGATIVE NEGATIVE Final   Influenza B by PCR NEGATIVE NEGATIVE Final    Comment: (NOTE) The Xpert Xpress SARS-CoV-2/FLU/RSV  plus assay is intended as an aid in the diagnosis of influenza from Nasopharyngeal swab specimens and should not be used as Gussie Murton sole basis for treatment. Nasal washings and aspirates are unacceptable for Xpert Xpress SARS-CoV-2/FLU/RSV testing.  Fact Sheet for Patients: EntrepreneurPulse.com.au  Fact Sheet for Healthcare Providers: IncredibleEmployment.be  This test is not yet approved or cleared by the [place] FDA and has been authorized for detection and/or diagnosis of SARS-CoV-2 by FDA under an Emergency Use Authorization (EUA). This EUA will remain in effect (meaning this test can be used) for the duration of the COVID-19 declaration under Section 564(b)(1) of the Act, 21 U.S.C. section 360bbb-3(b)(1), unless the authorization is terminated or revoked.  Performed at [facility], [address] [place], [place] [address]   Blood Culture (routine x 2)     Status: Abnormal (Preliminary result)   Collection Time: 07/18/20 10:47 AM   Specimen: BLOOD  Result Value Ref Range Status   Specimen Description   Final    BLOOD LEFT ANTECUBITAL Performed at [facility], [address] [place], [place] [address]    Special Requests   Final    BOTTLES DRAWN AEROBIC AND ANAEROBIC Blood Culture results may not be optimal due to an excessive volume of blood received in culture bottles Performed at [facility], [address], [place], [place] [address]    Culture  Setup Time   Final    GRAM POSITIVE COCCI AEROBIC BOTTLE ONLY Organism ID to follow CRITICAL RESULT CALLED TO, READ BACK BY AND VERIFIED WITH: [place] [phone] 07/19/20 HNM Performed at [facility], [address] [place], [place] [address]    Culture (Gilma Bessette)  Final    STAPHYLOCOCCUS HOMINIS THE SIGNIFICANCE OF ISOLATING THIS ORGANISM FROM Loyda Costin SINGLE SET  OF BLOOD CULTURES WHEN MULTIPLE SETS ARE DRAWN IS UNCERTAIN. PLEASE NOTIFY THE  MICROBIOLOGY DEPARTMENT WITHIN ONE WEEK IF SPECIATION AND SENSITIVITIES ARE REQUIRED. Performed at [facility], [place] [address], [place], [place] [address]    Report Status PENDING  Incomplete  Blood Culture ID Panel (Reflexed)     Status: Abnormal   Collection Time: 07/18/20 10:47 AM  Result Value Ref Range Status   Enterococcus faecalis NOT DETECTED NOT DETECTED Final   Enterococcus Faecium NOT DETECTED NOT DETECTED Final   Listeria monocytogenes NOT DETECTED NOT DETECTED Final   Staphylococcus species DETECTED (Ravin Denardo) NOT DETECTED Final    Comment: CRITICAL RESULT CALLED TO, READ BACK BY AND VERIFIED WITH: Wandalee Ferdinand RN [phone] 07/19/20 HNM    Staphylococcus aureus (BCID) NOT DETECTED NOT DETECTED Final   Staphylococcus epidermidis NOT DETECTED NOT DETECTED Final   Staphylococcus lugdunensis NOT DETECTED NOT DETECTED Final   Streptococcus species NOT DETECTED NOT DETECTED Final   Streptococcus agalactiae NOT DETECTED NOT DETECTED Final   Streptococcus pneumoniae NOT DETECTED NOT DETECTED Final   Streptococcus pyogenes NOT DETECTED NOT DETECTED Final   Matteus Mcnelly.calcoaceticus-baumannii NOT DETECTED NOT DETECTED Final   Bacteroides fragilis NOT DETECTED NOT DETECTED Final   Enterobacterales NOT DETECTED NOT DETECTED Final   Enterobacter cloacae complex NOT DETECTED NOT DETECTED Final   Escherichia coli NOT DETECTED NOT DETECTED Final   Klebsiella aerogenes NOT DETECTED NOT DETECTED Final   Klebsiella oxytoca NOT DETECTED NOT DETECTED Final   Klebsiella pneumoniae NOT DETECTED NOT DETECTED Final   Proteus species NOT DETECTED NOT DETECTED Final   Salmonella species NOT DETECTED NOT DETECTED Final   Serratia marcescens NOT DETECTED NOT DETECTED Final   Haemophilus influenzae NOT DETECTED NOT DETECTED Final   Neisseria meningitidis NOT DETECTED NOT DETECTED Final   Pseudomonas aeruginosa NOT DETECTED NOT DETECTED Final   Stenotrophomonas maltophilia NOT DETECTED NOT DETECTED Final    Candida albicans NOT DETECTED NOT DETECTED Final   Candida auris NOT DETECTED NOT DETECTED Final   Candida glabrata NOT DETECTED NOT DETECTED Final   Candida krusei NOT DETECTED NOT DETECTED Final   Candida parapsilosis NOT DETECTED NOT DETECTED Final   Candida tropicalis NOT DETECTED NOT DETECTED Final   Cryptococcus neoformans/gattii NOT DETECTED NOT DETECTED Final    Comment: Performed at [facility], [address] [place], [place] [address]  Urine culture     Status: Abnormal (Preliminary result)   Collection Time: 07/18/20  1:51 PM   Specimen: In/Out Cath Urine  Result Value Ref Range Status   Specimen Description   Final    IN/OUT CATH URINE Performed at [facility], [address], [place], [place] [address]    Special Requests   Final    NONE Performed at [facility], [address] [place], [place] [address]    Culture (Kirrah Mustin)  Final    50,000 COLONIES/mL ENTEROCOCCUS FAECALIS SUSCEPTIBILITIES TO FOLLOW Performed at [facility], [address], [place], [place] [address]    Report Status PENDING  Incomplete  MRSA PCR Screening     Status: None   Collection Time: 07/19/20  8:26 AM   Specimen: Nasopharyngeal  Result Value Ref Range Status   MRSA by PCR NEGATIVE NEGATIVE Final    Comment:        The GeneXpert MRSA Assay (FDA approved for NASAL specimens only), is one component of Leshia Kope comprehensive MRSA colonization surveillance program. It is not intended to diagnose MRSA infection nor  to guide or monitor treatment for MRSA infections. Performed at [facility], [address] [place], [place] [address]   Respiratory Panel by PCR     Status: None   Collection Time: 07/19/20  1:30 PM   Specimen: Nasopharyngeal Swab; Respiratory  Result Value Ref Range Status   Adenovirus NOT DETECTED NOT DETECTED Final   Coronavirus 229E NOT DETECTED NOT DETECTED Final    Comment: (NOTE) The Coronavirus on the Respiratory Panel,  DOES NOT test for the novel  Coronavirus (2019 nCoV)    Coronavirus HKU1 NOT DETECTED NOT DETECTED Final   Coronavirus NL63 NOT DETECTED NOT DETECTED Final   Coronavirus OC43 NOT DETECTED NOT DETECTED Final   Metapneumovirus NOT DETECTED NOT DETECTED Final   Rhinovirus / Enterovirus NOT DETECTED NOT DETECTED Final   Influenza Chrishauna Mee NOT DETECTED NOT DETECTED Final   Influenza B NOT DETECTED NOT DETECTED Final   Parainfluenza Virus 1 NOT DETECTED NOT DETECTED Final   Parainfluenza Virus 2 NOT DETECTED NOT DETECTED Final   Parainfluenza Virus 3 NOT DETECTED NOT DETECTED Final   Parainfluenza Virus 4 NOT DETECTED NOT DETECTED Final   Respiratory Syncytial Virus NOT DETECTED NOT DETECTED Final   Bordetella pertussis NOT DETECTED NOT DETECTED Final   Bordetella Parapertussis NOT DETECTED NOT DETECTED Final   Chlamydophila pneumoniae NOT DETECTED NOT DETECTED Final   Mycoplasma pneumoniae NOT DETECTED NOT DETECTED Final    Comment: Performed at [facility], [place], [place],  [address]         Radiology Studies: DG Chest Port 1 View  Result Date: 07/19/2020 CLINICAL DATA:  Increased shortness of breath EXAM: PORTABLE CHEST 1 VIEW COMPARISON:  07/18/2020 and prior FINDINGS: Mild hypoinflation. No pneumothorax or pleural effusion. Minimal bibasilar opacities. Diffuse interstitial prominence. Postsurgical appearance of the cardiomediastinal silhouette is unchanged. No acute osseous abnormality. IMPRESSION: Diffuse interstitial prominence and minimal bibasilar opacities, edema versus infection. Electronically Signed   By: Primitivo Gauze M.D.   On: 07/19/2020 07:53        Scheduled Meds: . sodium chloride   Intravenous Once  . sodium chloride   Intravenous Once  . albuterol  2.5 mg Nebulization Q6H  . atorvastatin  20 mg Oral Daily  . donepezil  10 mg Oral QHS  . finasteride  5 mg Oral Daily  . isosorbide mononitrate  30 mg Oral Daily  . multivitamin with  minerals  1 tablet Oral Daily  . pantoprazole (PROTONIX) IV  40 mg Intravenous Q24H  . tamsulosin  0.8 mg Oral QHS   Continuous Infusions: . sodium chloride 100 mL/hr at 07/19/20 0727  . ceFEPime (MAXIPIME) IV 2 g (07/20/20 1334)  . potassium chloride 10 mEq (07/20/20 1419)     LOS: 2 days    Time spent: over 30 min    Fayrene Helper, MD [facility]   To contact the attending provider between 7A-7P or the covering provider during after hours 7P-7A, please log into the web site [email or web address] and access using universal [place] password for that web site. If you do not have the password, please call the hospital operator.  07/20/2020, 2:51 PM

## 2020-07-20 NOTE — Evaluation (Signed)
Physical Therapy Evaluation Patient Details Name: Frank Mcdaniel MRN: [id number] DOB: [birth year] Today's Date: 07/20/2020   History of Present Illness  Pt admitted for febrile neutropenia. HIstory of anxiety, GERD, HTN, anemia, dementia, and hairy cell leukemia. Currently NPO at this time  Clinical Impression  Pt is a pleasant 86 year old male who was admitted for febrile neutropenia. Pt performs bed mobility, transfers, and ambulation with cga and RW. Pt demonstrates deficits with strength/mobility/endurance. All mobility performed on RA, however unsure of accurate pulse ox reading. Recommend using RW for all mobility at this time. Would benefit from skilled PT to address above deficits and promote optimal return to PLOF. Recommend transition to [place] upon discharge from acute hospitalization.     Follow Up Recommendations Home health PT;Supervision - Intermittent    Equipment Recommendations  Rolling walker with 5" wheels    Recommendations for Other Services       Precautions / Restrictions Precautions Precautions: Fall Restrictions Weight Bearing Restrictions: No      Mobility  Bed Mobility Overal bed mobility: Needs Assistance Bed Mobility: Supine to Sit     Supine to sit: Min guard     General bed mobility comments: Safe technique with upright posture. No dizziness noted    Transfers Overall transfer level: Needs assistance Equipment used: Rolling walker (2 wheeled) Transfers: Sit to/from Stand Sit to Stand: Min guard         General transfer comment: upright posture. First attempt without AD, 2nd attempt with RW with improved balance noted  Ambulation/Gait Ambulation/Gait assistance: Min guard Gait Distance (Feet): 110 Feet Assistive device: Rolling walker (2 wheeled) Gait Pattern/deviations: Step-through pattern     General Gait Details: ambulated in hallway with RW with reciprocal gait pattern. Good endurance with RW, however still having  secretions, filling up his mask, requesting to return back to room. All mobility performed on RA, however unable to get accurate O2 reading with therapy pulse ox  Stairs            Wheelchair Mobility    Modified Rankin (Stroke Patients Only)       Balance Overall balance assessment: Needs assistance Sitting-balance support: Feet supported Sitting balance-Leahy Scale: Good     Standing balance support: Bilateral upper extremity supported Standing balance-Leahy Scale: Fair                               Pertinent Vitals/Pain Pain Assessment: Faces Faces Pain Scale: Hurts a little bit Pain Location: R arm from IV placement Pain Descriptors / Indicators: Discomfort Pain Intervention(s): Limited activity within patient's tolerance    Home Living Family/patient expects to be discharged to:: Private residence Living Arrangements: Children Available Help at Discharge: Family;Available PRN/intermittently (lives with son who works) Type of Home: House Home Access: Stairs to enter Entrance Stairs-Rails: Can reach both Technical brewer of Steps: [place]: Laundry or work area in basement;Two level;Able to live on main level with bedroom/bathroom Home Equipment: None      Prior Function Level of Independence: Independent         Comments: Indep with household/community mobilization; denies fall history.  Participates/assists with household chores and responsbilities as able; enjoys 'walking around the block' when able     Hand Dominance        Extremity/Trunk Assessment   Upper Extremity Assessment Upper Extremity Assessment: Overall WFL for tasks assessed    Lower Extremity Assessment Lower Extremity Assessment: Generalized  weakness (B LE grossly 4/5)       Communication   Communication: HOH  Cognition Arousal/Alertness: Awake/alert Behavior During Therapy: WFL for tasks assessed/performed Overall Cognitive Status: Within Functional  Limits for tasks assessed                                        General Comments      Exercises Other Exercises Other Exercises: supine ther-ex performed on B LE including SLRs, hip abd/add, and LAQ. All ther-ex performed x 10 reps with cga   Assessment/Plan    PT Assessment Patient needs continued PT services  PT Problem List Decreased activity tolerance;Decreased balance;Decreased mobility       PT Treatment Interventions Gait training;DME instruction;Therapeutic exercise;Balance training    PT Goals (Current goals can be found in the Care Plan section)  Acute Rehab PT Goals Patient Stated Goal: to be able to eat PT Goal Formulation: With patient Time For Goal Achievement: 08/03/20 Potential to Achieve Goals: Good    Frequency Min 2X/week   Barriers to discharge        Co-evaluation               AM-PAC PT "6 Clicks" Mobility  Outcome Measure Help needed turning from your back to your side while in a flat bed without using bedrails?: A Little Help needed moving from lying on your back to sitting on the side of a flat bed without using bedrails?: A Little Help needed moving to and from a bed to a chair (including a wheelchair)?: A Little Help needed standing up from a chair using your arms (e.g., wheelchair or bedside chair)?: A Little Help needed to walk in hospital room?: A Little Help needed climbing 3-5 steps with a railing? : A Little 6 Click Score: 18    End of Session Equipment Utilized During Treatment: Gait belt Activity Tolerance: Patient tolerated treatment well Patient left: in chair;with nursing/sitter in room;with family/visitor present Nurse Communication: Mobility status PT Visit Diagnosis: Muscle weakness (generalized) (M62.81);Difficulty in walking, not elsewhere classified (R26.2);Unsteadiness on feet (R26.81)    Time: 5520-8022 PT Time Calculation (min) (ACUTE ONLY): 41 min   Charges:   PT Evaluation $PT Eval Low  Complexity: 1 Low PT Treatments $Gait Training: 8-22 mins $Therapeutic Exercise: 8-22 mins        Greggory Stallion, PT, DPT [phone]   Tihanna Goodson 07/20/2020, 4:23 PM

## 2020-07-20 NOTE — Consult Note (Addendum)
Pharmacy Antibiotic Note  Frank Mcdaniel is a 86 y.o. male admitted on 07/18/2020 with pneumonia and enterococcus UTI. Pt presented from cancer center with febrile neutropenia. 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 Pt being treated with Rituxan and Cladribine in April 2021 - pt then did not receive any further chemo until Jul 03, 2020 - on same regimen C1D1. Pharmacy has been consulted for Unasyn dosing.  Most recent fever 12/15 @ 2200: 100, ANC 0.7, LA 0.9, WBC 0.8, PCT 1.90  CT chest: Markedly limited evaluation of the lung parenchyma due to respiratory motion artifact with suggestion of multifocal peribronchovascular ground-glass airspace opacities that could represent infection/inflammation  Plan: Start Unasyn 3 g q8h  Monitor Scr  Follow up urine culture enterococcus faecalis susceptibilities   Height: 5\' 5"  (165.1 cm) Weight: 76.1 kg (167 lb 12.3 oz) IBW/kg (Calculated) : 61.5  Temp (24hrs), Avg:99.2 F (37.3 C), Min:98 F (36.7 C), Max:100 F (37.8 C)  Recent Labs  Lab 07/18/20 0830 07/18/20 1007 07/18/20 1010 07/18/20 1604 07/19/20 0132 07/20/20 0509  WBC 0.8* 0.7*  --   --  0.8*  0.8* 0.8*  CREATININE 1.43* 1.34*  --   --  1.37* 1.20  LATICACIDVEN  --   --  1.6 0.9  --   --     Estimated Creatinine Clearance: 42.8 mL/min (by C-G formula based on SCr of 1.2 mg/dL).    No Known Allergies  Antimicrobials this admission: 12/16 Unasyn >> 12/14 cefepime >>  12/14 vancomycin x 1 dose  Microbiology results: 12/14 BCx: 1/4 staph hominis - likely contaminant 12/14 UCx: 50,000 colonies/mL enterococcus faecalis 12/15 Sputum: pending 12/15 MRSA PCR: negative 12/14 resp. Panel negative  Thank you for allowing pharmacy to be a part of this patient's care.  Benn Moulder, PharmD Pharmacy Resident  07/20/2020 5:32 PM

## 2020-07-20 NOTE — Progress Notes (Signed)
SLP Cancellation Note  Patient Details Name: Frank Mcdaniel MRN: [id number] DOB: [birth year]   Cancelled treatment:       Reason Eval/Treat Not Completed: Medical issues which prohibited therapy;Patient not medically ready (reviewed chart notes; consulted MD). Met w/ pt and Son at beside. Sitter present in room. Pt continues to present w/ wet vocal quality and expectorated a min-mod amount of phlegm while in room, heavy congested coughing noted. Pt was on RA; wbc 0.8. Pt has recurrent hairy cell leukemia status post bone marrow biopsy. He had a recent chemotherapy tx November, 29, 2021, a dose ofRituxan pluscladribine,cycle 1,day 1.His son states that he ended up in the hospital after the initial treatment in March then as well.  Pt received deep suctioning by RT yesterday pm; he continues to present w/ increased phlegm and wet vocal quality currently. ST service does not recommend an oral diet at this time w/ pt's current presentation and Pulmonary presentation; the risk for aspiration of oral intake is increased d/t the Baseline wet vocal quality and suspected laryngopharyngeal phlegm.  Discussed w/ Son and MD that pt has chronic issues of Esophageal dysmotility w/ strictures/dilations. Question if the phlegm is a product of Esophageal issues -- MD will f/u w/ PPI, possible GI consult could be considered.  ST services will f/u w/ pt's status daily for improvement and readiness for safe oral intake. MD/NSG and Son agreed.      Orinda Kenner, MS, CCC-SLP Speech Language Pathologist Rehab Services [phone] [facility] 07/20/2020, 3:30 PM

## 2020-07-21 LAB — COMPREHENSIVE METABOLIC PANEL
ALT: 20 U/L (ref 0–44)
AST: 36 U/L (ref 15–41)
Albumin: 3.2 g/dL — ABNORMAL LOW (ref 3.5–5.0)
Alkaline Phosphatase: 47 U/L (ref 38–126)
Anion gap: 12 (ref 5–15)
BUN: 29 mg/dL — ABNORMAL HIGH (ref 8–23)
CO2: 25 mmol/L (ref 22–32)
Calcium: 8.6 mg/dL — ABNORMAL LOW (ref 8.9–10.3)
Chloride: 105 mmol/L (ref 98–111)
Creatinine, Ser: 1.38 mg/dL — ABNORMAL HIGH (ref 0.61–1.24)
GFR, Estimated: 50 mL/min — ABNORMAL LOW (ref >60)
Glucose, Bld: 129 mg/dL — ABNORMAL HIGH (ref 70–99)
Potassium: 2.9 mmol/L — ABNORMAL LOW (ref 3.5–5.1)
Sodium: 142 mmol/L (ref 135–145)
Total Bilirubin: 1.7 mg/dL — ABNORMAL HIGH (ref 0.3–1.2)
Total Protein: 6.2 g/dL — ABNORMAL LOW (ref 6.5–8.1)

## 2020-07-21 LAB — CBC WITH DIFFERENTIAL/PLATELET
Abs Immature Granulocytes: 0 10*3/uL (ref 0.00–0.07)
Basophils Absolute: 0 10*3/uL (ref 0.0–0.1)
Basophils Relative: 0 %
Eosinophils Absolute: 0 10*3/uL (ref 0.0–0.5)
Eosinophils Relative: 2 %
HCT: 24.5 % — ABNORMAL LOW (ref 39.0–52.0)
Hemoglobin: 8.3 g/dL — ABNORMAL LOW (ref 13.0–17.0)
Immature Granulocytes: 0 %
Lymphocytes Relative: 3 %
Lymphs Abs: 0 10*3/uL — ABNORMAL LOW (ref 0.7–4.0)
MCH: 33.3 pg (ref 26.0–34.0)
MCHC: 33.9 g/dL (ref 30.0–36.0)
MCV: 98.4 fL (ref 80.0–100.0)
Monocytes Absolute: 0 10*3/uL — ABNORMAL LOW (ref 0.1–1.0)
Monocytes Relative: 3 %
Neutro Abs: 0.6 10*3/uL — ABNORMAL LOW (ref 1.7–7.7)
Neutrophils Relative %: 92 %
Platelets: 75 10*3/uL — ABNORMAL LOW (ref 150–400)
RBC: 2.49 MIL/uL — ABNORMAL LOW (ref 4.22–5.81)
RDW: 18.3 % — ABNORMAL HIGH (ref 11.5–15.5)
Smear Review: NORMAL
WBC: 0.7 10*3/uL — CL (ref 4.0–10.5)
nRBC: 0 % (ref 0.0–0.2)

## 2020-07-21 LAB — MAGNESIUM: Magnesium: 2.4 mg/dL (ref 1.7–2.4)

## 2020-07-21 LAB — PHOSPHORUS: Phosphorus: 2 mg/dL — ABNORMAL LOW (ref 2.5–4.6)

## 2020-07-21 LAB — URINE CULTURE: Culture: 50000 — AB

## 2020-07-21 LAB — CULTURE, BLOOD (ROUTINE X 2)

## 2020-07-21 MED ORDER — POTASSIUM CHLORIDE 10 MEQ/100ML IV SOLN
10.0000 meq | INTRAVENOUS | Status: AC
  Administered 2020-07-21 (×4): 10 meq via INTRAVENOUS
  Filled 2020-07-21 (×2): qty 100

## 2020-07-21 MED ORDER — KCL-LACTATED RINGERS-D5W 20 MEQ/L IV SOLN
INTRAVENOUS | Status: DC
  Filled 2020-07-21 (×9): qty 1000

## 2020-07-21 MED ORDER — K PHOS MONO-SOD PHOS DI & MONO 155-852-130 MG PO TABS
500.0000 mg | ORAL_TABLET | ORAL | Status: AC
  Administered 2020-07-21 (×2): 500 mg via ORAL
  Filled 2020-07-21 (×2): qty 2

## 2020-07-21 NOTE — Consult Note (Signed)
Pharmacy Antibiotic Note  Frank Mcdaniel is a 86 y.o. male admitted on 07/18/2020 with pneumonia and enterococcus UTI. Pt presented from cancer center with febrile neutropenia. 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 Pt being treated with Rituxan and Cladribine in April 2021 - pt then did not receive any further chemo until Jul 03, 2020 - on same regimen C1D1. Pharmacy has been consulted for Unasyn dosing.  Most recent fever 12/15 @ 2200: 100, ANC 0.7, LA 0.9, WBC 0.8, PCT 1.90  CT chest: Markedly limited evaluation of the lung parenchyma due to respiratory motion artifact with suggestion of multifocal peribronchovascular ground-glass airspace opacities that could represent infection/inflammation  Plan: Continue Unasyn 3 g q8h  Monitor Scr    Height: 5\' 5"  (165.1 cm) Weight: 76.1 kg (167 lb 12.3 oz) IBW/kg (Calculated) : 61.5  Temp (24hrs), Avg:99.3 F (37.4 C), Min:98.4 F (36.9 C), Max:100 F (37.8 C)  Recent Labs  Lab 07/18/20 0830 07/18/20 1007 07/18/20 1010 07/18/20 1604 07/19/20 0132 07/20/20 0509 07/21/20 0534  WBC 0.8* 0.7*  --   --  0.8*  0.8* 0.8* 0.7*  CREATININE 1.43* 1.34*  --   --  1.37* 1.20 1.38*  LATICACIDVEN  --   --  1.6 0.9  --   --   --     Estimated Creatinine Clearance: 37.3 mL/min (A) (by C-G formula based on SCr of 1.38 mg/dL (H)).    No Known Allergies  Antimicrobials this admission: 12/16 Unasyn >> 12/14 cefepime >>  12/14 vancomycin x 1 dose  Microbiology results: 12/14 BCx: 1/4 staph hominis - likely contaminant 12/14 UCx: 50,000 colonies/mL enterococcus faecalis (sensitive to ampicillin0 12/15 Sputum: pending 12/15 MRSA PCR: negative 12/14 resp. Panel negative  Thank you for allowing pharmacy to be a part of this patient's care.  Rowland Lathe, PharmD Pharmacy Resident  07/21/2020 11:21 AM

## 2020-07-21 NOTE — Progress Notes (Addendum)
Speech Language Pathology Treatment: Dysphagia  Patient Details Name: Frank Mcdaniel MRN: [id number] DOB: 14-Jan-1933 Today's Date: 07/21/2020 Time: 1040-1140 SLP Time Calculation (min) (ACUTE ONLY): 60 min  Assessment / Plan / Recommendation Clinical Impression  Pt seen for ongoing assessment of swallowing; trials of po's to establish an oral diet if appropriate. Son present in room. He appears much improved w/ less laryngopharyngeal secretions and ongoing phlegm and wet vocal quality at Rest. On RA. Actually, prior to any po's, pt's vocal quality was dry upon verbalizaing w/ SLP and Son. During coughing, continued Congestion noted but often not productive in nature. Pt was alert and eager for po's; has a Baseline of Dementia and required verbal cues for follow through w/ po tasks but able to follow instructions w/ min cues. Pt is on RA; wbc wnl. Pt is HOH. MD is treating pt w/ IV PPI hopefully addressing phellm/secretions from a GI standpoint -- aware of pt's longstanding h/o Esophageal dysmotility and recent chemotherapy txs.  Pt explained general aspiration precautions and agreed verbally to the need for following them especially sitting upright for all oral intake. Pt assisted Fully w/ positioning d/t weakness then given trials of thin and nectar liquids, ice chips, purees and soft solids. Overt coughing was noted inconsistently w/ trials of thin liquids Via Cup w/ aspiration precautions in place; immediate and delayed coughing -- suspect delayed pharyngeal swallow initiation possibly impacted pharyngeal sensation d/t increased laryngopharyngeal phlegm. NO overt clinical s/s of aspiration were noted w/ Nectar liquids, purees, and mech soft food consistencies; respiratory status remained calm and unlabored, vocal quality clear b/t trials. Pt helped to hold Cup when drinking and feed self following instructions for single, small sips slowly when give Verbal Cues -- less follow through when not  monitored/cued. NO straws were utiilized for better oral control. Oral phase appeared grossly [facility] for bolus management and timely A-P transfer for swallowing; oral clearing achieved w/ all consistencies.  Recommend upgrade to Dysphagia level 3 diet (mech soft) w/ gravies added to moisten foods; Nectar liquids. Recommend general aspiration precautions, No Straws; Pills Whole in Puree vs Crushed; tray setup and positioning assistance for meals w/ monitoring for following aspiration precautions w/ all oral intake/meals. ST services will continue to f/u w/ pt for toleration of diet, trials to upgrade, objective assessment as indicated, and education while admitted. Son, NSG updated. Precautions posted at bedside.     HPI HPI: Pt is a 86 y.o. male with medical history significant for Dementai, CKD, CAD, anxiety, GERD, hypertension, anemia, history of recurrent hairy cell leukemia status post bone marrow biopsy on 11/02/19 which revealed recurrent hairy cell leukemia.  Patient was started on Rituxan and Cladribine in April, 2021.  It appears he did not receive any further chemotherapy until November, 29, 2021 when he received a dose of Rituxan plus cladribine, cycle 1, day 1.  His son states that he ended up in the hospital after the initial treatment in March.  Per son he has had cough, nasal congestion and shortness of breath for the past few days.  Symptoms been constant worsening.  Fever today of 101.9.  Has not had any medicines.  Per son he was outside doing yard work yesterday and felt pretty good.  No known Covid exposure.  Has been fully vaccinated with booster.  Pt also has a longstanding h/o Esophageal Stricture/Dilation -- the last time in 2018. Pt has minor c/o of Esophageal dysmotility at home -- any such can increase Esophageal phlegm which  can move into the pharynx.  MD is treating phlegmy secretions w/ PPI.      SLP Plan  Continue with current plan of care       Recommendations  Diet  recommendations: Dysphagia 3 (mechanical soft);Nectar-thick liquid Liquids provided via: Cup;No straw Medication Administration: Whole meds with puree Supervision: Patient able to self feed;Staff to assist with self feeding;Intermittent supervision to cue for compensatory strategies Compensations: Minimize environmental distractions;Slow rate;Small sips/bites;Lingual sweep for clearance of pocketing;Follow solids with liquid Postural Changes and/or Swallow Maneuvers: Seated upright 90 degrees;Upright 30-60 min after meal;Out of bed for meals                General recommendations:  (Dietician f/u; GI f/u for Esophageal dysmotility) Oral Care Recommendations: Staff/trained caregiver to provide oral care;Oral care BID Follow up Recommendations:  (TBD) SLP Visit Diagnosis: Dysphagia, oropharyngeal phase (R13.12) (baseline Dementia; Esophageal dysmotility) Plan: Continue with current plan of care       GO                 Frank Mcdaniel, [place], CCC-SLP Speech Language Pathologist Rehab Services [phone] [facility] 07/21/2020, 1:38 PM

## 2020-07-21 NOTE — Care Management Important Message (Signed)
Important Message  Patient Details  Name: Frank Mcdaniel MRN: [id number] Date of Birth: 1932-11-14   Medicare Important Message Given:  Yes     Juliann Pulse A Mickala Laton 07/21/2020, 11:35 AM

## 2020-07-21 NOTE — Progress Notes (Signed)
**Note De-Identified vi Obfusction** PROGRESS NOTE    Frank Mcdaniel  EYC:[id number] DOB: 08-Jn-1934 DOA: 07/18/2020 PCP: Leone Hven, MD   Chief Complint  Ptient presents with   Neutropeni    Brief Nrrtive: Frank Mcdaniel is  86 y.o. mle with medicl history significnt for nxiety, GERD, hypertension, nemi, history of recurrent hiry cell leukemi sttus post bone mrrow biopsy on 11/02/19 which reveled recurrent hiry cell leukemi.  Ptient ws strted on Rituxn nd Cldribine in April, 2021.  It ppers he did not receive ny further chemotherpy until November, 29, 2021 when he received  dose of Rituxn plus cldribine, cycle 1, dy 1.  His son sttes tht he ended up in the hospitl fter the initil tretment in [place]. About 2 dys prior to presenting to the emergency room he developed  fever with  T-mx of 101 F nd  wet sounding cough which is productive of occsionl cler phlegm nd ws seen t the urgent cre center where he ws strted on ntibiotics for presumed cute bronchitis.  Ptient went to the cncer center on the dy of his dmission for chemotherpy infusion nd lbs showed neutropeni.  He ws lso febrile with  T-mx of 100.7 F.  He ws sent to the emergency room for further evlution. He denies hving ny chest pin, no shortness of breth, no nuse, no vomiting no bdominl pin, no hedche, no dizziness, no lighthededness, no sore throt. Lbs show sodium 134, potssium 4.3, chloride 104, bicrb 22, glucose 133, BUN 26, cretinine 1.34 clcium 8.4, lkline phosphtse 47, lbumin 3.3, AST 34, ALT 12, totl protein 6.3, totl bili 2.0, BNP 158, lctic cid 1.6, white cell count 0.7, hemoglobin 5.5, hemtocrit 17, MCV 103, RDW 18.6, pltelet count 71 Respirtory virl pnel is negtive Chest x ry reviewed by me shows mild bibsilr telectsis/infiltrte. Twelve-led EKG shows sinus rhythm, LVH nd right bundle brnch block  ED Course: Ptient is n 24 yer old mle  sent from the cncer center to the emergency room for evlution of  fever with  T-mx of 100.7 F.  Ptient hs  history of recurrent hiry cell leukemi nd ws recently strted bck on chemotherpy.  He ws scheduled for n infusion on the dy of dmission but ws sent to the ER due to mrked neutropeni nd fever.  He received  dose of vncomycin nd cefepime in the ER nd will be dmitted to the hospitl for further evlution.  Assessment & Pln:   Principl Problem:   Febrile neutropeni (HCC) Active Problems:   HTN (hypertension)   Hiry cell leukemi (HCC)   Anemi   Anxiety   Dementi without behviorl disturbnce (HCC)   Pncytopeni (HCC)   CKD (chronic kidney disese), stge III (HCC)   Pllitive cre encounter  Febrile Neutropeni Community Acquired Pneumoni Enterococcus Feclis UTI Lst fever 12/14 ~1719 (temp to 100 on 12/15 PM) ANC 0.8 tody Blood cx with stph hominis in 1 of 4 bottles - suspect this is contminnt  MRSA PCR negtive.  Follow sputum cx if ble. Urine cx with enterococcus feclis, tret in setting of febrile neutropeni - sensitive to mpicillin Continue cefepime -> nrrow to unsyn to cover enterococcus nd pneumoni (follow susceptibilities) Oncology c/s, pprecite recommendtions - they note 2/2 clrdribine  Acute Hypoxic Respirtory Filure Improved, currently on RA CXR with diffuse interstitil prominence nd miniml bibsilr opcities - edem vs infection Will give dose of lsix with blood tody Echo 2021 with EF 14-97%, grde 1 distolic dysfunction, see report CT chest with  multifocal peribronchovascular ground glass airspace opacities (recommending repeat CT in 3-6 months to eval for resolution) SLP eval - recommending dysphagia 3, nectar thick liquid ? If secretions related to reflux, esophageal dysmotility - follow with PPI (seems to be improving) - can consider discussion with GI, but with improvement, follow  Distal  Esophageal Wall Thickening: PPI for esophagitis (underlying lesion can't be excluded), will need outpatient follow up with GI  Hypokalemia: replace and follow  Elevated Bilirubin: mild, improving, consider RUQ [place]  Pancytopenia Multifactorial and secondary to hairy cell leukemia as well as chemotherapy-induced S/p 2 unit pRBC  Monitor platelet count closely no indication for platelet transfusion at this time Appreciate oncology recs   Recurrent hairy cell leukemia On chemotherapy with oncology Follow outpatient with oncology  Coronary artery disease Continue nitrates as much as blood pressure tolerates Hold aspirin for now due to thrombocytopenia Continue statins  BPH Continue Flomax and finasteride  Dementia Continue Aricept  Chronic kidney disease, stage IIIa Renal function is stable We will monitor closely  Goals of care: palliative care c/s requested   DVT prophylaxis: SCD Code Status: DNR Family Communication: son at bedside Disposition:   Status is: Inpatient  Remains inpatient appropriate because:Inpatient level of care appropriate due to severity of illness   Dispo: The patient is from: Home              Anticipated d/c is to: pending              Anticipated d/c date is: > 3 days              Patient currently is not medically stable to d/c.     Consultants:   Palliative care  Procedures: none  Antimicrobials: Anti-infectives (From admission, onward)   Start     Dose/Rate Route Frequency Ordered Stop   07/20/20 1800  Ampicillin-Sulbactam (UNASYN) 3 g in sodium chloride 0.9 % 100 mL IVPB        3 g 200 mL/hr over 30 Minutes Intravenous Every 8 hours 07/20/20 1655     07/18/20 2200  ceFEPIme (MAXIPIME) 2 g in sodium chloride 0.9 % 100 mL IVPB  Status:  Discontinued        2 g 200 mL/hr over 30 Minutes Intravenous Every 12 hours 07/18/20 1236 07/20/20 1643   07/18/20 1115  vancomycin (VANCOREADY) IVPB 1500 mg/300 mL        1,500 mg 150  mL/hr over 120 Minutes Intravenous  Once 07/18/20 1037 07/18/20 1359   07/18/20 1015  ceFEPIme (MAXIPIME) 2 g in sodium chloride 0.9 % 100 mL IVPB        2 g 200 mL/hr over 30 Minutes Intravenous  Once 07/18/20 1008 07/18/20 1153   07/18/20 1015  vancomycin (VANCOCIN) IVPB 1000 mg/200 mL premix  Status:  Discontinued        1,000 mg 200 mL/hr over 60 Minutes Intravenous  Once 07/18/20 1008 07/18/20 1037     Subjective: No complaints again - discussed with pt and son at bedside  Objective: Vitals:   07/21/20 0740 07/21/20 0755 07/21/20 1118 07/21/20 1300  BP:  (!) 111/36 (!) 119/52   Pulse:  92 71   Resp:  16 16   Temp:  99.7 F (37.6 C) 98.5 F (36.9 C)   TempSrc:  Oral Oral   SpO2: 96% 95% 100% 95%  Weight:      Height:        Intake/Output Summary (Last 24 hours) at 07/21/2020 **Note De-Identified vi Obfusction** [place] filed t 07/21/2020 0500 Gross per 24 hour  Intke 882.06 ml  Output 1850 ml  Net -967.94 ml   Filed Weights   07/18/20 0935  Weight: 76.1 kg    Exmintion:  Generl: No cute distress. Crdiovsculr: Hert sounds show  regulr rte, nd rhythm Lungs: rspy voice, unlbored brething bdomen: Soft, nontender, nondistended Neurologicl: lert nd oriented 3. Moves ll extremities 4 . Crnil nerves II through XII grossly intct. Skin: Wrm nd dry. No rshes or lesions. Extremities: No clubbing or cynosis. No edem.    Dt Reviewed: I hve personlly reviewed following lbs nd imging studies  CBC: Recent Lbs  Lb 07/18/20 0830 07/18/20 1007 07/19/20 0132 07/20/20 0028 07/20/20 0509 07/21/20 0534  WBC 0.8* 0.7* 0.8*   0.8*  --  0.8* 0.7*  NEUTROBS 0.7* 0.7* 0.8*  --  0.7* 0.6*  HGB 6.2* 5.5* 6.7*   6.7* 8.7* 8.4* 8.3*  HCT 18.8* 17.0* 20.7*   20.0* 24.8* 24.5* 24.5*  MCV 102.7* 103.0* 103.0*   101.0*  --  96.5 98.4  PLT 83* 71* 74*   75*  --  78* 75*    Bsic Metbolic Pnel: Recent Lbs  Lb 07/18/20 0830 07/18/20 1007 07/19/20 0132  07/20/20 0509 07/21/20 0534  N 134* 134* 138 142 142  K 3.6 4.3 3.5 3.1* 2.9*  CL 102 104 108 108 105  CO2 19* 22 21* 26 25  GLUCOSE 153* 133* 111* 105* 129*  BUN 24* 26* 25* 24* 29*  CRETININE 1.43* 1.34* 1.37* 1.20 1.38*  CLCIUM 8.5* 8.4* 8.3* 8.3* 8.6*  MG  --   --   --  2.3 2.4  PHOS  --   --   --  2.6 2.0*    GFR: Estimted Cretinine Clernce: 37.3 mL/min () (by [employer] formul bsed on SCr of 1.38 mg/dL (H)).  Liver Function Tests: Recent Lbs  Lb 07/18/20 1007 07/19/20 0132 07/20/20 0509 07/21/20 0534  ST 34 30 34 36  LT _0 LKPHOS 47 46 47 47  BILITOT 2.0* 2.6* 2.7* 1.7*  PROT 6.3* 6.1* 6.3* 6.2*  LBUMIN 3.3* 3.3* 3.2* 3.2*    CBG: No results for input(s): GLUCP in the lst 168 hours.   Recent Results (from the pst 240 hour(s))  COVID-19, Flu +B nd RSV ([employer])     Sttus: None   Collection Time: 07/17/20  5:24 PM   Specimen: Nsophryngel Swb  Result Vlue Ref Rnge Sttus   SRS-CoV-2, N Not Detected Not Detected Finl    Comment: This nucleic cid mplifiction test ws developed nd its performnce chrcteristics determined by [employer]. Nucleic cid mplifiction tests include RT-PCR nd TM. This test hs not been FD clered or pproved. This test hs been uthorized by FD under n Emergency Use uthoriztion (EU). This test is only uthorized for the durtion of time the declrtion tht circumstnces exist justifying the uthoriztion of the emergency use of in vitro dignostic tests for detection of SRS-CoV-2 virus nd/or dignosis of COVID-19 infection under section 564(b)(1) of the ct, 21 U.S.C. 383NV-9(T) (1), unless the uthoriztion is terminted or revoked sooner. When dignostic testing is negtive, the possibility of  flse negtive result should be considered in the context of  ptient's recent exposures nd the presence of clinicl signs nd symptoms consistent with COVID-19. n individul  without symptoms of COVID-19 nd who is not shedding SRS-CoV-2 virus wo uld expect to hve  negtive (not detected) result in this  assay.    Influenza Narelle Schoening, NAA Not Detected Not Detected Final   Influenza B, NAA Not Detected Not Detected Final   RSV, NAA Not Detected Not Detected Final  Blood Culture (routine x 2)     Status: None (Preliminary result)   Collection Time: 07/18/20 10:10 AM   Specimen: BLOOD  Result Value Ref Range Status   Specimen Description BLOOD BLOOD RIGHT FOREARM  Final   Special Requests   Final    BOTTLES DRAWN AEROBIC AND ANAEROBIC Blood Culture adequate volume   Culture   Final    NO GROWTH 2 DAYS Performed at [facility], [address], [place], [place] [address]    Report Status PENDING  Incomplete  Resp Panel by RT-PCR (Flu Cyree Chuong&B, Covid) Nasopharyngeal Swab     Status: None   Collection Time: 07/18/20 10:10 AM   Specimen: Nasopharyngeal Swab; Nasopharyngeal(NP) swabs in vial transport medium  Result Value Ref Range Status   SARS Coronavirus 2 by RT PCR NEGATIVE NEGATIVE Final    Comment: (NOTE) SARS-CoV-2 target nucleic acids are NOT DETECTED.  The SARS-CoV-2 RNA is generally detectable in upper respiratory specimens during the acute phase of infection. The lowest concentration of SARS-CoV-2 viral copies this assay can detect is 138 copies/mL. Kenlynn Houde negative result does not preclude SARS-Cov-2 infection and should not be used as the sole basis for treatment or other patient management decisions. Detrick Dani negative result may occur with  improper specimen collection/handling, submission of specimen other than nasopharyngeal swab, presence of viral mutation(s) within the areas targeted by this assay, and inadequate number of viral copies(<138 copies/mL). Viyan Rosamond negative result must be combined with clinical observations, patient history, and epidemiological information. The expected result is Negative.  Fact Sheet for Patients:   EntrepreneurPulse.com.au  Fact Sheet for Healthcare Providers:  IncredibleEmployment.be  This test is no t yet approved or cleared by the [place] FDA and  has been authorized for detection and/or diagnosis of SARS-CoV-2 by FDA under an Emergency Use Authorization (EUA). This EUA will remain  in effect (meaning this test can be used) for the duration of the COVID-19 declaration under Section 564(b)(1) of the Act, 21 U.S.C.section 360bbb-3(b)(1), unless the authorization is terminated  or revoked sooner.       Influenza Navah Grondin by PCR NEGATIVE NEGATIVE Final   Influenza B by PCR NEGATIVE NEGATIVE Final    Comment: (NOTE) The Xpert Xpress SARS-CoV-2/FLU/RSV plus assay is intended as an aid in the diagnosis of influenza from Nasopharyngeal swab specimens and should not be used as Acie Custis sole basis for treatment. Nasal washings and aspirates are unacceptable for Xpert Xpress SARS-CoV-2/FLU/RSV testing.  Fact Sheet for Patients: EntrepreneurPulse.com.au  Fact Sheet for Healthcare Providers: IncredibleEmployment.be  This test is not yet approved or cleared by the [place] FDA and has been authorized for detection and/or diagnosis of SARS-CoV-2 by FDA under an Emergency Use Authorization (EUA). This EUA will remain in effect (meaning this test can be used) for the duration of the COVID-19 declaration under Section 564(b)(1) of the Act, 21 U.S.C. section 360bbb-3(b)(1), unless the authorization is terminated or revoked.  Performed at [facility], [address] [place], [place] [address]   Blood Culture (routine x 2)     Status: Abnormal   Collection Time: 07/18/20 10:47 AM   Specimen: BLOOD  Result Value Ref Range Status   Specimen Description   Final    BLOOD LEFT ANTECUBITAL Performed at [facility], [place],  [place] [address]    Special Requests   Final     BOTTLES DRAWN AEROBIC AND ANAEROBIC Blood Culture results may not be optimal due to an excessive volume of blood received in culture bottles Performed at [facility], [address] [place], [place] [address]    Culture  Setup Time   Final    GRAM POSITIVE COCCI AEROBIC BOTTLE ONLY Organism ID to follow CRITICAL RESULT CALLED TO, READ BACK BY AND VERIFIED WITH: Leslie [phone] 07/19/20 HNM Performed at [facility], [address] [place], [place] [address]    Culture (Fidelia Cathers)  Final    STAPHYLOCOCCUS HOMINIS THE SIGNIFICANCE OF ISOLATING THIS ORGANISM FROM Malcolm Hetz SINGLE SET OF BLOOD CULTURES WHEN MULTIPLE SETS ARE DRAWN IS UNCERTAIN. PLEASE NOTIFY THE MICROBIOLOGY DEPARTMENT WITHIN ONE WEEK IF SPECIATION AND SENSITIVITIES ARE REQUIRED. Performed at [facility], [place] [address] [place], [place] [address]    Report Status 07/21/2020 FINAL  Final  Blood Culture ID Panel (Reflexed)     Status: Abnormal   Collection Time: 07/18/20 10:47 AM  Result Value Ref Range Status   Enterococcus faecalis NOT DETECTED NOT DETECTED Final   Enterococcus Faecium NOT DETECTED NOT DETECTED Final   Listeria monocytogenes NOT DETECTED NOT DETECTED Final   Staphylococcus species DETECTED (Braiden Presutti) NOT DETECTED Final    Comment: CRITICAL RESULT CALLED TO, READ BACK BY AND VERIFIED WITH: Wandalee Ferdinand RN [phone] 07/19/20 HNM    Staphylococcus aureus (BCID) NOT DETECTED NOT DETECTED Final   Staphylococcus epidermidis NOT DETECTED NOT DETECTED Final   Staphylococcus lugdunensis NOT DETECTED NOT DETECTED Final   Streptococcus species NOT DETECTED NOT DETECTED Final   Streptococcus agalactiae NOT DETECTED NOT DETECTED Final   Streptococcus pneumoniae NOT DETECTED NOT DETECTED Final   Streptococcus pyogenes NOT DETECTED NOT DETECTED Final   Aeris Hersman.calcoaceticus-baumannii NOT DETECTED NOT DETECTED Final   Bacteroides fragilis NOT DETECTED NOT DETECTED Final   Enterobacterales NOT DETECTED NOT DETECTED  Final   Enterobacter cloacae complex NOT DETECTED NOT DETECTED Final   Escherichia coli NOT DETECTED NOT DETECTED Final   Klebsiella aerogenes NOT DETECTED NOT DETECTED Final   Klebsiella oxytoca NOT DETECTED NOT DETECTED Final   Klebsiella pneumoniae NOT DETECTED NOT DETECTED Final   Proteus species NOT DETECTED NOT DETECTED Final   Salmonella species NOT DETECTED NOT DETECTED Final   Serratia marcescens NOT DETECTED NOT DETECTED Final   Haemophilus influenzae NOT DETECTED NOT DETECTED Final   Neisseria meningitidis NOT DETECTED NOT DETECTED Final   Pseudomonas aeruginosa NOT DETECTED NOT DETECTED Final   Stenotrophomonas maltophilia NOT DETECTED NOT DETECTED Final   Candida albicans NOT DETECTED NOT DETECTED Final   Candida auris NOT DETECTED NOT DETECTED Final   Candida glabrata NOT DETECTED NOT DETECTED Final   Candida krusei NOT DETECTED NOT DETECTED Final   Candida parapsilosis NOT DETECTED NOT DETECTED Final   Candida tropicalis NOT DETECTED NOT DETECTED Final   Cryptococcus neoformans/gattii NOT DETECTED NOT DETECTED Final    Comment: Performed at [facility], [address] [place], [place] [address]  Urine culture     Status: Abnormal   Collection Time: 07/18/20  1:51 PM   Specimen: In/Out Cath Urine  Result Value Ref Range Status   Specimen Description   Final    IN/OUT CATH URINE Performed at [facility], [address], [place], [place] [address]    Special Requests   Final    NONE Performed at [facility], [phone] **Note De-Identified vi Obfusction** [address] [place], [place] [address]    Culture 50,000 COLONIES/mL ENTEROCOCCUS FECLIS ()  Finl   Report Sttus 07/21/2020 FINL  Finl   Orgnism ID, Bcteri ENTEROCOCCUS FECLIS ()  Finl      Susceptibility   Enterococcus feclis - MIC*    MPICILLIN <=2 SENSITIVE Sensitive     NITROFURNTOIN <=16 SENSITIVE Sensitive     VNCOMYCIN 1 SENSITIVE Sensitive     * 50,000 COLONIES/mL ENTEROCOCCUS FECLIS  MRS PCR  Screening     Sttus: None   Collection Time: 07/19/20  8:26 M   Specimen: Nsophryngel  Result Vlue Ref Rnge Sttus   MRS by PCR NEGTIVE NEGTIVE Finl    Comment:        The GeneXpert MRS ssy (FD pproved for NSL specimens only), is one component of  comprehensive MRS coloniztion surveillnce progrm. It is not intended to dignose MRS infection nor to guide or monitor tretment for MRS infections. Performed t [facility], [address] [place], [place] [address]   Respirtory Pnel by PCR     Sttus: None   Collection Time: 07/19/20  1:30 PM   Specimen: Nsophryngel Swb; Respirtory  Result Vlue Ref Rnge Sttus   denovirus NOT DETECTED NOT DETECTED Finl   Coronvirus 229E NOT DETECTED NOT DETECTED Finl    Comment: (NOTE) The Coronvirus on the Respirtory Pnel, DOES NOT test for the novel  Coronvirus (2019 nCoV)    Coronvirus HKU1 NOT DETECTED NOT DETECTED Finl   Coronvirus NL63 NOT DETECTED NOT DETECTED Finl   Coronvirus OC43 NOT DETECTED NOT DETECTED Finl   Metpneumovirus NOT DETECTED NOT DETECTED Finl   Rhinovirus / Enterovirus NOT DETECTED NOT DETECTED Finl   Influenz  NOT DETECTED NOT DETECTED Finl   Influenz B NOT DETECTED NOT DETECTED Finl   Prinfluenz Virus 1 NOT DETECTED NOT DETECTED Finl   Prinfluenz Virus 2 NOT DETECTED NOT DETECTED Finl   Prinfluenz Virus 3 NOT DETECTED NOT DETECTED Finl   Prinfluenz Virus 4 NOT DETECTED NOT DETECTED Finl   Respirtory Syncytil Virus NOT DETECTED NOT DETECTED Finl   Bordetell pertussis NOT DETECTED NOT DETECTED Finl   Bordetell Prpertussis NOT DETECTED NOT DETECTED Finl   Chlmydophil pneumonie NOT DETECTED NOT DETECTED Finl   Mycoplsm pneumonie NOT DETECTED NOT DETECTED Finl    Comment: Performed t [facility], [place]. [address] [place], [place] [address]         Rdiology Studies: CT CHEST WO CONTRST  Result Dte:  07/20/2020 CLINICL DT:  Chest pin, shortness of breth. EXM: CT CHEST WITHOUT CONTRST TECHNIQUE: Multidetector CT imging of the chest ws performed following the stndrd protocol without IV contrst. COMPRISON:  CT chest, bdomen, pelvis 10/08/2019. CT chest 07/22/2014 FINDINGS: Crdiovsculr: Norml hert size. No significnt pericrdil effusion. The thorcic ort is norml in cliber. t lest mild therosclerotic plque of the thorcic ort. Four vessel coronry rtery clcifictions sttus post coronry rtery bypss. Medistinum/Nodes: No enlrged medistinl or xillry lymph nodes. Thyroid glnd in trche demonstrte no significnt findings. The esophgel wll ppers slightly thickened distlly. Lungs/Pleur: Mrkedly limited evlution due to respirtory motion rtifct. Intervl development of suggestion of multifocl peribronchovsculr ground-glss irspce opcities tht re most prominent within the right upper lobe (24, 3:35, 56, 70). Pulmonry micronodule long the left mjor fissure (3:24) likely represents n intrpulmonry lymph node. Couple of dense pulmonry micronodules within right upper lobe (3:35). No pulmonry mss. No pleurl effusion. No pneumothorx. Upper bdomen: Redemonstrtion of  t lest  2 cm calcified stone within the gallbladder lumen. 1 cm fluid density lesion within the right hepatic lobe (2:103) that appears similar compared to prior CT abdomen pelvis. Otherwise no acute abnormality. Musculoskeletal: No chest wall abnormality. No suspicious lytic or blastic osseous lesions. No acute displaced fracture. Multilevel degenerative changes of the spine. IMPRESSION: 1. Markedly limited evaluation of the lung parenchyma due to respiratory motion artifact with suggestion of multifocal peribronchovascular ground-glass airspace opacities that could represent infection/inflammation. Recommend repeat CT in 3-6 months to evaluate for resolution as an underlying  adenocarcinoma cannot be excluded. 2. Persistent distal esophageal wall thickening. Correlate with esophagitis. An underlying lesion cannot be excluded. 3. Cholelithiasis. Electronically Signed   By: Iven Finn M.D.   On: 07/20/2020 16:42        Scheduled Meds:  sodium chloride   Intravenous Once   sodium chloride   Intravenous Once   albuterol  2.5 mg Nebulization Q6H   atorvastatin  20 mg Oral Daily   donepezil  10 mg Oral QHS   finasteride  5 mg Oral Daily   isosorbide mononitrate  30 mg Oral Daily   multivitamin with minerals  1 tablet Oral Daily   pantoprazole (PROTONIX) IV  40 mg Intravenous Q24H   tamsulosin  0.8 mg Oral QHS   Continuous Infusions:  sodium chloride 100 mL/hr at 07/19/20 0727   ampicillin-sulbactam (UNASYN) IV 3 g (07/21/20 1324)   dextrose 5% lactated ringers with KCl 20 mEq/L 75 mL/hr at 07/21/20 0856     LOS: 3 days    Time spent: over 30 min    Fayrene Helper, MD [facility]   To contact the attending provider between 7A-7P or the covering provider during after hours 7P-7A, please log into the web site [email or web address] and access using universal [place] password for that web site. If you do not have the password, please call the hospital operator.  07/21/2020, 2:50 PM

## 2020-07-22 LAB — COMPREHENSIVE METABOLIC PANEL
ALT: 26 U/L (ref 0–44)
AST: 44 U/L — ABNORMAL HIGH (ref 15–41)
Albumin: 2.9 g/dL — ABNORMAL LOW (ref 3.5–5.0)
Alkaline Phosphatase: 47 U/L (ref 38–126)
Anion gap: 9 (ref 5–15)
BUN: 23 mg/dL (ref 8–23)
CO2: 25 mmol/L (ref 22–32)
Calcium: 8 mg/dL — ABNORMAL LOW (ref 8.9–10.3)
Chloride: 109 mmol/L (ref 98–111)
Creatinine, Ser: 1.24 mg/dL (ref 0.61–1.24)
GFR, Estimated: 57 mL/min — ABNORMAL LOW (ref >60)
Glucose, Bld: 126 mg/dL — ABNORMAL HIGH (ref 70–99)
Potassium: 3.5 mmol/L (ref 3.5–5.1)
Sodium: 143 mmol/L (ref 135–145)
Total Bilirubin: 1.2 mg/dL (ref 0.3–1.2)
Total Protein: 5.6 g/dL — ABNORMAL LOW (ref 6.5–8.1)

## 2020-07-22 LAB — CBC WITH DIFFERENTIAL/PLATELET
Abs Immature Granulocytes: 0 10*3/uL (ref 0.00–0.07)
Basophils Absolute: 0 10*3/uL (ref 0.0–0.1)
Basophils Relative: 0 %
Eosinophils Absolute: 0 10*3/uL (ref 0.0–0.5)
Eosinophils Relative: 2 %
HCT: 20.8 % — ABNORMAL LOW (ref 39.0–52.0)
Hemoglobin: 7.1 g/dL — ABNORMAL LOW (ref 13.0–17.0)
Immature Granulocytes: 0 %
Lymphocytes Relative: 3 %
Lymphs Abs: 0 10*3/uL — ABNORMAL LOW (ref 0.7–4.0)
MCH: 34 pg (ref 26.0–34.0)
MCHC: 34.1 g/dL (ref 30.0–36.0)
MCV: 99.5 fL (ref 80.0–100.0)
Monocytes Absolute: 0 10*3/uL — ABNORMAL LOW (ref 0.1–1.0)
Monocytes Relative: 3 %
Neutro Abs: 0.6 10*3/uL — ABNORMAL LOW (ref 1.7–7.7)
Neutrophils Relative %: 92 %
Platelets: 65 10*3/uL — ABNORMAL LOW (ref 150–400)
RBC: 2.09 MIL/uL — ABNORMAL LOW (ref 4.22–5.81)
RDW: 18.2 % — ABNORMAL HIGH (ref 11.5–15.5)
Smear Review: DECREASED
WBC: 0.6 10*3/uL — CL (ref 4.0–10.5)
nRBC: 0 % (ref 0.0–0.2)

## 2020-07-22 LAB — MAGNESIUM: Magnesium: 2.2 mg/dL (ref 1.7–2.4)

## 2020-07-22 LAB — HEMOGLOBIN AND HEMATOCRIT, BLOOD
HCT: 22.1 % — ABNORMAL LOW (ref 39.0–52.0)
Hemoglobin: 7.1 g/dL — ABNORMAL LOW (ref 13.0–17.0)

## 2020-07-22 LAB — PHOSPHORUS: Phosphorus: 3.3 mg/dL (ref 2.5–4.6)

## 2020-07-22 LAB — PREPARE RBC (CROSSMATCH)

## 2020-07-22 MED ORDER — SODIUM CHLORIDE 0.9% IV SOLUTION: Freq: Once | INTRAVENOUS | Status: AC

## 2020-07-22 MED ORDER — ALBUTEROL SULFATE (2.5 MG/3ML) 0.083% IN NEBU
2.5000 mg | INHALATION_SOLUTION | Freq: Two times a day (BID) | RESPIRATORY_TRACT | Status: DC
  Administered 2020-07-22 – 2020-07-24 (×5): 2.5 mg via RESPIRATORY_TRACT
  Filled 2020-07-22 (×5): qty 3

## 2020-07-22 MED ORDER — SODIUM CHLORIDE 0.9 % IV SOLN
3.0000 g | Freq: Four times a day (QID) | INTRAVENOUS | Status: DC
  Administered 2020-07-22 – 2020-07-24 (×8): 3 g via INTRAVENOUS
  Filled 2020-07-22: qty 8
  Filled 2020-07-22: qty 3
  Filled 2020-07-22 (×3): qty 8
  Filled 2020-07-22: qty 3
  Filled 2020-07-22 (×2): qty 8
  Filled 2020-07-22 (×3): qty 3
  Filled 2020-07-22: qty 8

## 2020-07-22 MED ORDER — FUROSEMIDE 10 MG/ML IJ SOLN
20.0000 mg | Freq: Once | INTRAMUSCULAR | Status: AC
  Administered 2020-07-22: 22:00:00 20 mg via INTRAVENOUS
  Filled 2020-07-22: qty 4

## 2020-07-22 NOTE — Plan of Care (Signed)

## 2020-07-22 NOTE — Progress Notes (Signed)
SLP F/u Note  Patient Details Name: Frank Mcdaniel MRN: [id number] DOB: 09-15-1932   Cancelled treatment:       Reason Eval/Treat Not Completed:  (chart reviewed; consulted NSG then met w/ pt/Son). Per NSG report, pt has done well w/ his current dysphagia diet w/ Nectar liquids w/ no reported overt s/s of aspiration noted by NSG. Pills continue in puree for safety; aspiration precautions. Son present in room this AM stated pt did well last night and "almost ate everything on the plate this morning" - he described several items pt consumed including the Nectar liquids. Pt continues on RA w/ no decline in Pulmonary status. Son reported pt exhibited an intermittent cough "mostly after the meal" this AM and 1x yesterday w/ dinner -- suspect this could be related to pt's Esophageal phase Dysmotility. Discussed the importance of pt slowing down and Not overeating all at one time but instead spreading out the foods during a meal and after to avoid Esophageal phase Regurgitation. Pt has a Baseline of Esophageal phase Dysmotility which can increase risk for aspiration of REFLUX material.  ST services will continue to monitor pt's status and attempt trials to upgrade diet consistnecy while admitted. Son and NSG agreed.    Orinda Kenner, MS, CCC-SLP Speech Language Pathologist Rehab Services [phone] [facility] 07/22/2020, 11:28 AM

## 2020-07-22 NOTE — Consult Note (Signed)
PHARMACY NOTE:  ANTIMICROBIAL RENAL DOSAGE ADJUSTMENT  Current antimicrobial regimen includes a mismatch between antimicrobial dosage and estimated renal function.  As per policy approved by the Pharmacy & Therapeutics and Medical Executive Committees, the antimicrobial dosage will be adjusted accordingly.  Current antimicrobial dosage:  Unasyn 3 g q8H   Indication: PNA/UTI  Renal Function:  Estimated Creatinine Clearance: 41.5 mL/min (by C-G formula based on SCr of 1.24 mg/dL).    Antimicrobial dosage has been changed to:  Unaysn 3g q6H.    Thank you for allowing pharmacy to be a part of this patient's care.  Oswald Hillock, [facility] 07/22/2020 10:13 AM

## 2020-07-22 NOTE — Progress Notes (Signed)
**Note De-identified vi Obfusction** PROGRESS NOTE    [name]  MRN:[id number] DOB: 02/15/1933 DOA: 07/18/2020 PCP: Sonnenberg, Eric G, MD   Chief Complint  Ptient presents with  . Neutropeni    Brief Nrrtive: Frank Mcdaniel is  86 y.o. mle with medicl history significnt for nxiety, GERD, hypertension, nemi, history of recurrent hiry cell leukemi sttus post bone mrrow biopsy on 11/02/19 which reveled recurrent hiry cell leukemi.  Ptient ws strted on Rituxn nd Cldribine in April, 2021.  It ppers he did not receive ny further chemotherpy until November, 29, 2021 when he received  dose of Rituxn plus cldribine, cycle 1, dy 1.  His son sttes tht he ended up in the hospitl fter the initil tretment in [place]. About 2 dys prior to presenting to the emergency room he developed  fever with  T-mx of 101 F nd  wet sounding cough which is productive of occsionl cler phlegm nd ws seen t the urgent cre center where he ws strted on ntibiotics for presumed cute bronchitis.  Ptient went to the cncer center on the dy of his dmission for chemotherpy infusion nd lbs showed neutropeni.  He ws lso febrile with  T-mx of 100.7 F.  He ws sent to the emergency room for further evlution. He denies hving ny chest pin, no shortness of breth, no nuse, no vomiting no bdominl pin, no hedche, no dizziness, no lighthededness, no sore throt. Lbs show sodium 134, potssium 4.3, chloride 104, bicrb 22, glucose 133, BUN 26, cretinine 1.34 clcium 8.4, lkline phosphtse 47, lbumin 3.3, AST 34, ALT 12, totl protein 6.3, totl bili 2.0, BNP 158, lctic cid 1.6, white cell count 0.7, hemoglobin 5.5, hemtocrit 17, MCV 103, RDW 18.6, pltelet count 71 Respirtory virl pnel is negtive Chest x ry reviewed by me shows mild bibsilr telectsis/infiltrte. Twelve-led EKG shows sinus rhythm, LVH nd right bundle brnch block  ED Course: Ptient is n 86-yer-old mle  sent from the cncer center to the emergency room for evlution of  fever with  T-mx of 100.7 F.  Ptient hs  history of recurrent hiry cell leukemi nd ws recently strted bck on chemotherpy.  He ws scheduled for n infusion on the dy of dmission but ws sent to the ER due to mrked neutropeni nd fever.  He received  dose of vncomycin nd cefepime in the ER nd will be dmitted to the hospitl for further evlution.  Assessment & Pln:   Principl Problem:   Febrile neutropeni (HCC) Active Problems:   HTN (hypertension)   Hiry cell leukemi (HCC)   Anemi   Anxiety   Dementi without behviorl disturbnce (HCC)   Pncytopeni (HCC)   CKD (chronic kidney disese), stge III (HCC)   Pllitive cre encounter  Febrile Neutropeni Community Acquired Pneumoni Enterococcus Feclis UTI Lst fever 12/14 ~1719 (temp to 100 on 12/15 PM) ANC 0.6 tody Blood cx with stph hominis in 1 of 4 bottles - suspect this is contminnt  MRSA PCR negtive.  Follow sputum cx if ble. Urine cx with enterococcus feclis, tret in setting of febrile neutropeni - sensitive to mpicillin Continue cefepime -> nrrow to unsyn to cover enterococcus nd pneumoni Oncology c/s, pprecite recommendtions - they note 2/2 clrdribine  Acute Hypoxic Respirtory Filure Improved, currently on RA CXR with diffuse interstitil prominence nd miniml bibsilr opcities - edem vs infection Will give dose of lsix with blood tody Echo 2021 with EF 60-65%, grde 1 distolic dysfunction, see report CT chest with multifocl peribronchovsculr ground   glass airspace opacities (recommending repeat CT in 3-6 months to eval for resolution) SLP eval - recommending dysphagia 3, nectar thick liquid ? If secretions related to reflux, esophageal dysmotility - follow with PPI (seems to be improving) - can consider discussion with GI, but with improvement, follow  Distal Esophageal Wall Thickening: PPI  for esophagitis (underlying lesion can't be excluded), will need outpatient follow up with GI, discussed with son today  Hypokalemia: replace and follow  Elevated Bilirubin: mild, improving, consider RUQ [place]  Pancytopenia Multifactorial and secondary to hairy cell leukemia as well as chemotherapy-induced S/p 2 unit pRBC  Monitor platelet count closely no indication for platelet transfusion at this time Appreciate oncology recs   Recurrent hairy cell leukemia On chemotherapy with oncology Follow outpatient with oncology  Coronary artery disease Continue nitrates as much as blood pressure tolerates Hold aspirin for now due to thrombocytopenia Continue statins  BPH Continue Flomax and finasteride  Dementia Continue Aricept  Chronic kidney disease, stage IIIa Renal function is stable We will monitor closely  Goals of care: palliative care c/s requested   DVT prophylaxis: SCD Code Status: DNR Family Communication: son at bedside Disposition:   Status is: Inpatient  Remains inpatient appropriate because:Inpatient level of care appropriate due to severity of illness   Dispo: The patient is from: Home              Anticipated d/c is to: pending              Anticipated d/c date is: > 3 days              Patient currently is not medically stable to d/c.     Consultants:   Palliative care  Procedures: none  Antimicrobials: Anti-infectives (From admission, onward)   Start     Dose/Rate Route Frequency Ordered Stop   07/22/20 1600  Ampicillin-Sulbactam (UNASYN) 3 g in sodium chloride 0.9 % 100 mL IVPB        3 g 200 mL/hr over 30 Minutes Intravenous Every 6 hours 07/22/20 1011     07/20/20 1800  Ampicillin-Sulbactam (UNASYN) 3 g in sodium chloride 0.9 % 100 mL IVPB  Status:  Discontinued        3 g 200 mL/hr over 30 Minutes Intravenous Every 8 hours 07/20/20 1655 07/22/20 1011   07/18/20 2200  ceFEPIme (MAXIPIME) 2 g in sodium chloride 0.9 % 100 mL IVPB   Status:  Discontinued        2 g 200 mL/hr over 30 Minutes Intravenous Every 12 hours 07/18/20 1236 07/20/20 1643   07/18/20 1115  vancomycin (VANCOREADY) IVPB 1500 mg/300 mL        1,500 mg 150 mL/hr over 120 Minutes Intravenous  Once 07/18/20 1037 07/18/20 1359   07/18/20 1015  ceFEPIme (MAXIPIME) 2 g in sodium chloride 0.9 % 100 mL IVPB        2 g 200 mL/hr over 30 Minutes Intravenous  Once 07/18/20 1008 07/18/20 1153   07/18/20 1015  vancomycin (VANCOCIN) IVPB 1000 mg/200 mL premix  Status:  Discontinued        1,000 mg 200 mL/hr over 60 Minutes Intravenous  Once 07/18/20 1008 07/18/20 1037     Subjective: No new complaints Son at bedside  Objective: Vitals:   07/21/20 2331 07/22/20 0336 07/22/20 0712 07/22/20 0900  BP: (!) 109/57 133/66  (!) 125/48  Pulse: 87 76  65  Resp: _0 Temp: 97.9 F (36.6 C) 99.8 **Note De-identified vi Obfusction** F (37.7 C)  98.7 F (37.1 C)  TempSrc:    Orl  SpO2: 92% 97% 95% 97%  Weight:      Height:        Intke/Output Summry (Lst 24 hours) t 07/22/2020 1351 Lst dt filed t 07/22/2020 0357 Gross per 24 hour  Intke 1013.83 ml  Output 850 ml  Net 163.83 ml   Filed Weights   07/18/20 0935  Weight: 76.1 kg    Exmintion:  Generl: No cute distress. Crdiovsculr: Hert sounds show  regulr rte, nd rhythm.  Lungs: corse breth sounds, horse voice bdomen: Soft, nontender, nondistended  Neurologicl: lert nd oriented 3. Moves ll extremities 4 . Crnil nerves II through XII grossly intct. Skin: Wrm nd dry. No rshes or lesions. Extremities: No clubbing or cynosis. No edem.     Dt Reviewed: I hve personlly reviewed following lbs nd imging studies  CBC: Recent Lbs  Lb 07/18/20 1007 07/19/20 0132 07/20/20 0028 07/20/20 0509 07/21/20 0534 07/22/20 0538 07/22/20 1249  WBC 0.7* 0.8*  0.8*  --  0.8* 0.7* 0.6*  --   NEUTROBS 0.7* 0.8*  --  0.7* 0.6* 0.6*  --   HGB 5.5* 6.7*  6.7* 8.7* 8.4* 8.3* 7.1* 7.1*  HCT  17.0* 20.7*  20.0* 24.8* 24.5* 24.5* 20.8* 22.1*  MCV 103.0* 103.0*  101.0*  --  96.5 98.4 99.5  --   PLT 71* 74*  75*  --  78* 75* 65*  --     Bsic Metbolic Pnel: Recent Lbs  Lb 07/18/20 1007 07/19/20 0132 07/20/20 0509 07/21/20 0534 07/22/20 0538  N 134* 138 142 142 143  K 4.3 3.5 3.1* 2.9* 3.5  CL 104 108 108 105 109  CO2 22 21* 26 25 25  GLUCOSE 133* 111* 105* 129* 126*  BUN 26* 25* 24* 29* 23  CRETININE 1.34* 1.37* 1.20 1.38* 1.24  CLCIUM 8.4* 8.3* 8.3* 8.6* 8.0*  MG  --   --  2.3 2.4 2.2  PHOS  --   --  2.6 2.0* 3.3    GFR: Estimted Cretinine Clernce: 41.5 mL/min (by C-G formul bsed on SCr of 1.24 mg/dL).  Liver Function Tests: Recent Lbs  Lb 07/18/20 1007 07/19/20 0132 07/20/20 0509 07/21/20 0534 07/22/20 0538  ST 34 30 34 36 44*  LT 12 15 16 20 26  LKPHOS 47 46 47 47 47  BILITOT 2.0* 2.6* 2.7* 1.7* 1.2  PROT 6.3* 6.1* 6.3* 6.2* 5.6*  LBUMIN 3.3* 3.3* 3.2* 3.2* 2.9*    CBG: No results for input(s): GLUCP in the lst 168 hours.   Recent Results (from the pst 240 hour(s))  COVID-19, Flu +B nd RSV ([facility])     Sttus: None   Collection Time: 07/17/20  5:24 PM   Specimen: Nsophryngel Swb  Result Vlue Ref Rnge Sttus   SRS-CoV-2, N Not Detected Not Detected Finl    Comment: This nucleic cid mplifiction test ws developed nd its performnce chrcteristics determined by [employer] [employer]. Nucleic cid mplifiction tests include RT-PCR nd TM. This test hs not been FD clered or pproved. This test hs been uthorized by FD under n Emergency Use uthoriztion (EU). This test is only uthorized for the durtion of time the declrtion tht circumstnces exist justifying the uthoriztion of the emergency use of in vitro dignostic tests for detection of SRS-CoV-2 virus nd/or dignosis of COVID-19 infection under section 564(b)(1) of the ct, 21 U.S.C. 360bbb-3(b) (1), unless the uthoriztion is  terminted or revoked sooner.   When diagnostic testing is negative, the possibility of Fatin Bachicha false negative result should be considered in the context of Natania Finigan patient's recent exposures and the presence of clinical signs and symptoms consistent with COVID-19. An individual without symptoms of COVID-19 and who is not shedding SARS-CoV-2 virus wo uld expect to have Celsey Asselin negative (not detected) result in this assay.    Influenza Marchia Diguglielmo, NAA Not Detected Not Detected Final   Influenza B, NAA Not Detected Not Detected Final   RSV, NAA Not Detected Not Detected Final  Blood Culture (routine x 2)     Status: None (Preliminary result)   Collection Time: 07/18/20 10:10 AM   Specimen: BLOOD  Result Value Ref Range Status   Specimen Description BLOOD BLOOD RIGHT FOREARM  Final   Special Requests   Final    BOTTLES DRAWN AEROBIC AND ANAEROBIC Blood Culture adequate volume   Culture   Final    NO GROWTH 4 DAYS Performed at [facility], [address], [place], [place] [address]    Report Status PENDING  Incomplete  Resp Panel by RT-PCR (Flu Citlaly Camplin&B, Covid) Nasopharyngeal Swab     Status: None   Collection Time: 07/18/20 10:10 AM   Specimen: Nasopharyngeal Swab; Nasopharyngeal(NP) swabs in vial transport medium  Result Value Ref Range Status   SARS Coronavirus 2 by RT PCR NEGATIVE NEGATIVE Final    Comment: (NOTE) SARS-CoV-2 target nucleic acids are NOT DETECTED.  The SARS-CoV-2 RNA is generally detectable in upper respiratory specimens during the acute phase of infection. The lowest concentration of SARS-CoV-2 viral copies this assay can detect is 138 copies/mL. Kabria Hetzer negative result does not preclude SARS-Cov-2 infection and should not be used as the sole basis for treatment or other patient management decisions. Breella Vanostrand negative result may occur with  improper specimen collection/handling, submission of specimen other than nasopharyngeal swab, presence of viral mutation(s) within the areas targeted by this  assay, and inadequate number of viral copies(<138 copies/mL). Bellah Alia negative result must be combined with clinical observations, patient history, and epidemiological information. The expected result is Negative.  Fact Sheet for Patients:  EntrepreneurPulse.com.au  Fact Sheet for Healthcare Providers:  IncredibleEmployment.be  This test is no t yet approved or cleared by the [place] FDA and  has been authorized for detection and/or diagnosis of SARS-CoV-2 by FDA under an Emergency Use Authorization (EUA). This EUA will remain  in effect (meaning this test can be used) for the duration of the COVID-19 declaration under Section 564(b)(1) of the Act, 21 U.S.C.section 360bbb-3(b)(1), unless the authorization is terminated  or revoked sooner.       Influenza Miyoshi Ligas by PCR NEGATIVE NEGATIVE Final   Influenza B by PCR NEGATIVE NEGATIVE Final    Comment: (NOTE) The Xpert Xpress SARS-CoV-2/FLU/RSV plus assay is intended as an aid in the diagnosis of influenza from Nasopharyngeal swab specimens and should not be used as Derwood Becraft sole basis for treatment. Nasal washings and aspirates are unacceptable for Xpert Xpress SARS-CoV-2/FLU/RSV testing.  Fact Sheet for Patients: EntrepreneurPulse.com.au  Fact Sheet for Healthcare Providers: IncredibleEmployment.be  This test is not yet approved or cleared by the [place] FDA and has been authorized for detection and/or diagnosis of SARS-CoV-2 by FDA under an Emergency Use Authorization (EUA). This EUA will remain in effect (meaning this test can be used) for the duration of the COVID-19 declaration under Section 564(b)(1) of the Act, 21 U.S.C. section 360bbb-3(b)(1), unless the authorization is terminated or revoked.  Performed at [facility], [phone]  [address], [name], [place] [address]   Blood Culture (routine x 2)     Status: Abnormal   Collection Time: 07/18/20  10:47 AM   Specimen: BLOOD  Result Value Ref Range Status   Specimen Description   Final    BLOOD LEFT ANTECUBITAL Performed at [facility], [address] [place], [place] [address]    Special Requests   Final    BOTTLES DRAWN AEROBIC AND ANAEROBIC Blood Culture results may not be optimal due to an excessive volume of blood received in culture bottles Performed at [facility], [address] [place], [place] [address]    Culture  Setup Time   Final    GRAM POSITIVE COCCI AEROBIC BOTTLE ONLY Organism ID to follow CRITICAL RESULT CALLED TO, READ BACK BY AND VERIFIED WITH: Wandalee Ferdinand RN [phone] 07/19/20 HNM Performed at [facility], [address] [place], [place] [address]    Culture (Kaden Dunkel)  Final    STAPHYLOCOCCUS HOMINIS THE SIGNIFICANCE OF ISOLATING THIS ORGANISM FROM Jjesus Dingley SINGLE SET OF BLOOD CULTURES WHEN MULTIPLE SETS ARE DRAWN IS UNCERTAIN. PLEASE NOTIFY THE MICROBIOLOGY DEPARTMENT WITHIN ONE WEEK IF SPECIATION AND SENSITIVITIES ARE REQUIRED. Performed at [facility], [place] [address] [place], [place] [address]    Report Status 07/21/2020 FINAL  Final  Blood Culture ID Panel (Reflexed)     Status: Abnormal   Collection Time: 07/18/20 10:47 AM  Result Value Ref Range Status   Enterococcus faecalis NOT DETECTED NOT DETECTED Final   Enterococcus Faecium NOT DETECTED NOT DETECTED Final   Listeria monocytogenes NOT DETECTED NOT DETECTED Final   Staphylococcus species DETECTED (Xayla Puzio) NOT DETECTED Final    Comment: CRITICAL RESULT CALLED TO, READ BACK BY AND VERIFIED WITH: Wandalee Ferdinand RN [phone] 07/19/20 HNM    Staphylococcus aureus (BCID) NOT DETECTED NOT DETECTED Final   Staphylococcus epidermidis NOT DETECTED NOT DETECTED Final   Staphylococcus lugdunensis NOT DETECTED NOT DETECTED Final   Streptococcus species NOT DETECTED NOT DETECTED Final   Streptococcus agalactiae NOT DETECTED NOT DETECTED Final   Streptococcus pneumoniae NOT DETECTED NOT  DETECTED Final   Streptococcus pyogenes NOT DETECTED NOT DETECTED Final   Everline Mahaffy.calcoaceticus-baumannii NOT DETECTED NOT DETECTED Final   Bacteroides fragilis NOT DETECTED NOT DETECTED Final   Enterobacterales NOT DETECTED NOT DETECTED Final   Enterobacter cloacae complex NOT DETECTED NOT DETECTED Final   Escherichia coli NOT DETECTED NOT DETECTED Final   Klebsiella aerogenes NOT DETECTED NOT DETECTED Final   Klebsiella oxytoca NOT DETECTED NOT DETECTED Final   Klebsiella pneumoniae NOT DETECTED NOT DETECTED Final   Proteus species NOT DETECTED NOT DETECTED Final   Salmonella species NOT DETECTED NOT DETECTED Final   Serratia marcescens NOT DETECTED NOT DETECTED Final   Haemophilus influenzae NOT DETECTED NOT DETECTED Final   Neisseria meningitidis NOT DETECTED NOT DETECTED Final   Pseudomonas aeruginosa NOT DETECTED NOT DETECTED Final   Stenotrophomonas maltophilia NOT DETECTED NOT DETECTED Final   Candida albicans NOT DETECTED NOT DETECTED Final   Candida auris NOT DETECTED NOT DETECTED Final   Candida glabrata NOT DETECTED NOT DETECTED Final   Candida krusei NOT DETECTED NOT DETECTED Final   Candida parapsilosis NOT DETECTED NOT DETECTED Final   Candida tropicalis NOT DETECTED NOT DETECTED Final   Cryptococcus neoformans/gattii NOT DETECTED NOT DETECTED Final    Comment: Performed at [facility], [address], [place], [place] [address]  Urine culture     Status: Abnormal   Collection Time: **Note De-identified vi Obfusction** 07/18/20  1:51 PM   Specimen: In/Out Cth Urine  Result Vlue Ref Rnge Sttus   Specimen Description   Finl    IN/OUT CTH URINE Performed t [facility], [address] [place], [place] [address]    Specil Requests   Finl    NONE Performed t [facility], [address] [place], [place] [address]    Culture 50,000 COLONIES/mL ENTEROCOCCUS FECLIS ()  Finl   Report Sttus 07/21/2020 FINL  Finl   Orgnism ID, Bcteri ENTEROCOCCUS FECLIS ()   Finl      Susceptibility   Enterococcus feclis - MIC*    MPICILLIN <=2 SENSITIVE Sensitive     NITROFURNTOIN <=16 SENSITIVE Sensitive     VNCOMYCIN 1 SENSITIVE Sensitive     * 50,000 COLONIES/mL ENTEROCOCCUS FECLIS  MRS PCR Screening     Sttus: None   Collection Time: 07/19/20  8:26 M   Specimen: Nsophryngel  Result Vlue Ref Rnge Sttus   MRS by PCR NEGTIVE NEGTIVE Finl    Comment:        The GeneXpert MRS ssy (FD pproved for NSL specimens only), is one component of  comprehensive MRS coloniztion surveillnce progrm. It is not intended to dignose MRS infection nor to guide or monitor tretment for MRS infections. Performed t  [facility], [address] [place], [place] [address]   Respirtory Pnel by PCR     Sttus: None   Collection Time: 07/19/20  1:30 PM   Specimen: Nsophryngel Swb; Respirtory  Result Vlue Ref Rnge Sttus   denovirus NOT DETECTED NOT DETECTED Finl   Coronvirus 229E NOT DETECTED NOT DETECTED Finl    Comment: (NOTE) The Coronvirus on the Respirtory Pnel, DOES NOT test for the novel  Coronvirus (2019 nCoV)    Coronvirus HKU1 NOT DETECTED NOT DETECTED Finl   Coronvirus NL63 NOT DETECTED NOT DETECTED Finl   Coronvirus OC43 NOT DETECTED NOT DETECTED Finl   Metpneumovirus NOT DETECTED NOT DETECTED Finl   Rhinovirus / Enterovirus NOT DETECTED NOT DETECTED Finl   Influenz  NOT DETECTED NOT DETECTED Finl   Influenz B NOT DETECTED NOT DETECTED Finl   Prinfluenz Virus 1 NOT DETECTED NOT DETECTED Finl   Prinfluenz Virus 2 NOT DETECTED NOT DETECTED Finl   Prinfluenz Virus 3 NOT DETECTED NOT DETECTED Finl   Prinfluenz Virus 4 NOT DETECTED NOT DETECTED Finl   Respirtory Syncytil Virus NOT DETECTED NOT DETECTED Finl   Bordetell pertussis NOT DETECTED NOT DETECTED Finl   Bordetell Prpertussis NOT DETECTED NOT DETECTED Finl   Chlmydophil pneumonie NOT DETECTED NOT  DETECTED Finl   Mycoplsm pneumonie NOT DETECTED NOT DETECTED Finl    Comment: Performed t [facility], [address] [place],  [address]         Rdiology Studies: CT CHEST WO CONTRST  Result Dte: 07/20/2020 CLINICL DT:  Chest pin, shortness of breth. EXM: CT CHEST WITHOUT CONTRST TECHNIQUE: Multidetector CT imging of the chest ws performed following the stndrd protocol without IV contrst. COMPRISON:  CT chest, bdomen, pelvis 10/08/2019. CT chest 07/22/2014 FINDINGS: Crdiovsculr: Norml hert size. No significnt pericrdil effusion. The thorcic ort is norml in cliber. t lest mild therosclerotic plque of the thorcic ort. Four vessel coronry rtery clcifictions sttus post coronry rtery bypss. Medistinum/Nodes: No enlrged medistinl or xillry lymph nodes. Thyroid glnd in trche demonstrte no significnt findings. The esophgel wll ppers slightly thickened distlly. Lungs/Pleur: Mrkedly limited evlution due to respirtory motion rtifct. Intervl development of   suggestion of multifocal peribronchovascular ground-glass airspace opacities that are most prominent within the right upper lobe (24, 3:35, 56, 70). Pulmonary micronodule along the left major fissure (3:24) likely represents an intrapulmonary lymph node. Couple of dense pulmonary micronodules within right upper lobe (3:35). No pulmonary mass. No pleural effusion. No pneumothorax. Upper Abdomen: Redemonstration of Woodward Klem at least 2 cm calcified stone within the gallbladder lumen. 1 cm fluid density lesion within the right hepatic lobe (2:103) that appears similar compared to prior CT abdomen pelvis. Otherwise no acute abnormality. Musculoskeletal: No chest wall abnormality. No suspicious lytic or blastic osseous lesions. No acute displaced fracture. Multilevel degenerative changes of the spine. IMPRESSION: 1. Markedly limited evaluation of the lung parenchyma due to respiratory  motion artifact with suggestion of multifocal peribronchovascular ground-glass airspace opacities that could represent infection/inflammation. Recommend repeat CT in 3-6 months to evaluate for resolution as an underlying adenocarcinoma cannot be excluded. 2. Persistent distal esophageal wall thickening. Correlate with esophagitis. An underlying lesion cannot be excluded. 3. Cholelithiasis. Electronically Signed   By: Iven Finn M.D.   On: 07/20/2020 16:42        Scheduled Meds: . sodium chloride   Intravenous Once  . sodium chloride   Intravenous Once  . albuterol  2.5 mg Nebulization BID  . atorvastatin  20 mg Oral Daily  . donepezil  10 mg Oral QHS  . finasteride  5 mg Oral Daily  . isosorbide mononitrate  30 mg Oral Daily  . multivitamin with minerals  1 tablet Oral Daily  . pantoprazole (PROTONIX) IV  40 mg Intravenous Q24H  . tamsulosin  0.8 mg Oral QHS   Continuous Infusions: . sodium chloride 100 mL/hr at 07/19/20 0727  . ampicillin-sulbactam (UNASYN) IV    . dextrose 5% lactated ringers with KCl 20 mEq/L 75 mL/hr at 07/21/20 1613     LOS: 4 days    Time spent: over 30 min    Fayrene Helper, MD [facility]   To contact the attending provider between 7A-7P or the covering provider during after hours 7P-7A, please log into the web site [email or web address] and access using universal [place] password for that web site. If you do not have the password, please call the hospital operator.  07/22/2020, 1:51 PM

## 2020-07-22 NOTE — Progress Notes (Signed)
Hematology/Oncology Consult note [facility]  Telephone:([phone] [phone]  Patient Care Team: [name], MD as PCP - General (Family Medicine) Rockey Situ Kathlene November, MD as PCP - Cardiology (Cardiology) [name], MD as Consulting Physician (Cardiology) [name], MD as Consulting Physician (Oncology) [name] as Physician Assistant (Urology)   Name of the patient: Frank Mcdaniel  [id number]  18-Nov-1932   Date of visit: 07/22/2020   Interval history- somewhat confused. Appears comfortable. No fever in the last 24 hours  ECOG PS- 2 Pain scale- 0   Review of systems- Review of Systems  Constitutional: Positive for malaise/fatigue. Negative for chills, fever and weight loss.  HENT: Negative for congestion, ear discharge and nosebleeds.   Eyes: Negative for blurred vision.  Respiratory: Positive for cough. Negative for hemoptysis, sputum production, shortness of breath and wheezing.   Cardiovascular: Negative for chest pain, palpitations, orthopnea and claudication.  Gastrointestinal: Negative for abdominal pain, blood in stool, constipation, diarrhea, heartburn, melena, nausea and vomiting.  Genitourinary: Negative for dysuria, flank pain, frequency, hematuria and urgency.  Musculoskeletal: Negative for back pain, joint pain and myalgias.  Skin: Negative for rash.  Neurological: Negative for dizziness, tingling, focal weakness, seizures, weakness and headaches.  Endo/Heme/Allergies: Does not bruise/bleed easily.  Psychiatric/Behavioral: Negative for depression and suicidal ideas. The patient does not have insomnia.       No Known Allergies   Past Medical History:  Diagnosis Date  . Anemia 09/01/2015  . Anxiety 09/11/2015  . Arthritis   . Asthma    as a teenager  . Asymptomatic Sinus Bradycardia   . Atypical chest pain    a. 08/2014  . BPH (benign prostatic hyperplasia)   . CKD (chronic  kidney disease), stage III ([place])   . Closed fracture of tuft of distal phalanx of finger 08/31/2018  . Coronary artery disease    a. 2000 s/p CABG;  b. 10/2008 Neg MV, EF 64%; c. cath 05/2015: LM 85% sev cal, pLAD-1 lesion 80%, pLAD-2 lesion 100% chronic, ostLCx 70% sev cal, OM4 90%, ostRCA 70%, mid RCA 90%, LIMA-LAD patent, VG-OM2 patent, VG-OM3 patent, VG-RPDA patent. No AS  . Dementia without behavioral disturbance ([place]) 05/28/2019  . GERD (gastroesophageal reflux disease)   . H/O echocardiogram    a. 10/2005 Echo: nl EF.  [name] Hairy cell leukemia ([place])   . HOH (hard of hearing)    Bilateral hearing aids  . HTN (hypertension)   . Hyperlipidemia   . Hypertension   . Leukemia ([place])   . Neutropenic fever ([place]) 11/08/2019  . Stricture and stenosis of esophagus      Past Surgical History:  Procedure Laterality Date  . CARDIAC CATHETERIZATION N/A 05/26/2015   Procedure: Left Heart Cath;  Surgeon: [name], MD;  Location: [place] [facility];  Service: Cardiovascular;  Laterality: N/A;  . CARDIAC CATHETERIZATION N/A 05/26/2015   Procedure: Coronary/Graft Angiography;  Surgeon: [name], MD;  Location: [place] [facility];  Service: Cardiovascular;  Laterality: N/A;  . CATARACT EXTRACTION    . CORONARY ARTERY BYPASS GRAFT  2000  . ESOPHAGOGASTRODUODENOSCOPY N/A 02/18/2017   Procedure: ESOPHAGOGASTRODUODENOSCOPY (EGD) with removal of food bolus;  Surgeon: [name], MD;  Location: [facility] ENDOSCOPY;  Service: Endoscopy;  Laterality: N/A;  . ESOPHAGOGASTRODUODENOSCOPY (EGD) WITH PROPOFOL N/A 03/11/2017   Procedure: ESOPHAGOGASTRODUODENOSCOPY (EGD) WITH PROPOFOL;  Surgeon: [name], MD;  Location: [facility] ENDOSCOPY;  Service: Endoscopy;  Laterality: N/A;  .  ESOPHAGOGASTRODUODENOSCOPY (EGD) WITH PROPOFOL N/A 04/15/2017   Procedure: ESOPHAGOGASTRODUODENOSCOPY (EGD) WITH PROPOFOL;  Surgeon: [name], MD;  Location: [facility] ENDOSCOPY;  Service: Endoscopy;  Laterality: N/A;  . EYE  SURGERY Left    Cataract Extraction with IOL  . INGUINAL HERNIA REPAIR Left 06/24/2016   Procedure: HERNIA REPAIR INGUINAL ADULT;  Surgeon: [name], MD;  Location: [facility] ORS;  Service: General;  Laterality: Left;  . TONSILLECTOMY  1942   . UPPER GASTROINTESTINAL ENDOSCOPY  3 years ago    with Dilation.     Social History   Socioeconomic History  . Marital status: Widowed    Spouse name: Not on file  . Number of children: 1  . Years of education: Not on file  . Highest education level: Not on file  Occupational History  . Occupation: retired  Tobacco Use  . Smoking status: Never Smoker  . Smokeless tobacco: Never Used  Vaping Use  . Vaping Use: Never used  Substance and Sexual Activity  . Alcohol use: No  . Drug use: No  . Sexual activity: Never  Other Topics Concern  . Not on file  Social History Narrative   Lives locally with son.  Fairly active around the house though does not routinely exercise.  Retired from [employer].   Social Determinants of Health   Financial Resource Strain: Low Risk   . Difficulty of Paying Living Expenses: Not hard at all  Food Insecurity: No Food Insecurity  . Worried About Charity fundraiser in the Last Year: Never true  . Ran Out of Food in the Last Year: Never true  Transportation Needs: No Transportation Needs  . Lack of Transportation (Medical): No  . Lack of Transportation (Non-Medical): No  Physical Activity: Unknown  . Days of Exercise per Week: 0 days  . Minutes of Exercise per Session: Not on file  Stress: No Stress Concern Present  . Feeling of Stress : Not at all  Social Connections: Not on file  Intimate Partner Violence: Not At Risk  . Fear of Current or Ex-Partner: No  . Emotionally Abused: No  . Physically Abused: No  . Sexually Abused: No    Family History  Problem Relation Age of Onset  . Hypertension Mother   . Arthritis Father   . Other Other        no premature CAD.  [name] Colon cancer Neg Hx   .  Prostate cancer Neg Hx   . Stomach cancer Neg Hx      Current Facility-Administered Medications:  .  0.9 %  sodium chloride infusion (Manually program via Guardrails IV Fluids), , Intravenous, Once, Agbata, Tochukwu, MD .  0.9 %  sodium chloride infusion (Manually program via Guardrails IV Fluids), , Intravenous, Once, Elodia Florence., MD .  0.9 %  sodium chloride infusion, , Intravenous, Continuous, Agbata, Tochukwu, MD, Last Rate: 100 mL/hr at 07/19/20 0727, Infusion Verify at 07/19/20 0727 .  acetaminophen (TYLENOL) tablet 650 mg, 650 mg, Oral, Q6H PRN, 650 mg at 07/18/20 1541 **OR** acetaminophen (TYLENOL) suppository 650 mg, 650 mg, Rectal, Q6H PRN, Agbata, Tochukwu, MD .  albuterol (PROVENTIL) (2.5 MG/3ML) 0.083% nebulizer solution 2.5 mg, 2.5 mg, Nebulization, BID, Elodia Florence., MD, 2.5 mg at 07/22/20 0710 .  Ampicillin-Sulbactam (UNASYN) 3 g in sodium chloride 0.9 % 100 mL IVPB, 3 g, Intravenous, Q8H, Benn Moulder, RPH, Last Rate: 200 mL/hr at 07/22/20 0133, 3 g at 07/22/20 0133 .  atorvastatin (LIPITOR)  tablet 20 mg, 20 mg, Oral, Daily, Agbata, Tochukwu, MD, 20 mg at 07/21/20 1156 .  dextrose 5% in lactated ringers with KCl 20 mEq/L infusion, , Intravenous, Continuous, Elodia Florence., MD, Last Rate: 75 mL/hr at 07/21/20 1613, Infusion Verify at 07/21/20 1613 .  donepezil (ARICEPT) tablet 10 mg, 10 mg, Oral, QHS, Agbata, Tochukwu, MD, 10 mg at 07/21/20 2158 .  finasteride (PROSCAR) tablet 5 mg, 5 mg, Oral, Daily, Agbata, Tochukwu, MD, 5 mg at 07/21/20 1157 .  haloperidol (HALDOL) tablet 1 mg, 1 mg, Oral, Q6H PRN **OR** haloperidol lactate (HALDOL) injection 1 mg, 1 mg, Intramuscular, Q6H PRN, Elodia Florence., MD, 1 mg at 07/19/20 1531 .  isosorbide mononitrate (IMDUR) 24 hr tablet 30 mg, 30 mg, Oral, Daily, Agbata, Tochukwu, MD, 30 mg at 07/21/20 1157 .  multivitamin with minerals tablet 1 tablet, 1 tablet, Oral, Daily, Agbata, Tochukwu, MD, 1 tablet  at 07/21/20 1157 .  ondansetron (ZOFRAN) tablet 4 mg, 4 mg, Oral, Q6H PRN **OR** ondansetron (ZOFRAN) injection 4 mg, 4 mg, Intravenous, Q6H PRN, Agbata, Tochukwu, MD .  pantoprazole (PROTONIX) injection 40 mg, 40 mg, Intravenous, Q24H, Elodia Florence., MD, 40 mg at 07/21/20 1157 .  tamsulosin (FLOMAX) capsule 0.8 mg, 0.8 mg, Oral, QHS, Agbata, Tochukwu, MD, 0.8 mg at 07/21/20 2158  Physical exam:  Vitals:   07/21/20 2005 07/21/20 2331 07/22/20 0336 07/22/20 0712  BP: (!) 103/46 (!) 109/57 133/66   Pulse: (!) 58 87 76   Resp: 16 15 18    Temp: 97.8 F (36.6 C) 97.9 F (36.6 C) 99.8 F (37.7 C)   TempSrc:      SpO2: (!) 89% 92% 97% 95%  Weight:      Height:       Physical Exam Eyes:     Extraocular Movements: EOM normal.  Cardiovascular:     Rate and Rhythm: Normal rate and regular rhythm.     Heart sounds: Normal heart sounds.  Pulmonary:     Effort: Pulmonary effort is normal.     Breath sounds: Normal breath sounds.  Abdominal:     General: Bowel sounds are normal.     Palpations: Abdomen is soft.  Skin:    General: Skin is warm and dry.  Neurological:     Mental Status: He is alert.     Comments: Oriented to self      CMP Latest Ref Rng & Units 07/22/2020  Glucose 70 - 99 mg/dL 126(H)  BUN 8 - 23 mg/dL 23  Creatinine 0.61 - 1.24 mg/dL 1.24  Sodium 135 - 145 mmol/L 143  Potassium 3.5 - 5.1 mmol/L 3.5  Chloride 98 - 111 mmol/L 109  CO2 22 - 32 mmol/L 25  Calcium 8.9 - 10.3 mg/dL 8.0(L)  Total Protein 6.5 - 8.1 g/dL 5.6(L)  Total Bilirubin 0.3 - 1.2 mg/dL 1.2  Alkaline Phos 38 - 126 U/L 47  AST 15 - 41 U/L 44(H)  ALT 0 - 44 U/L 26   CBC Latest Ref Rng & Units 07/22/2020  WBC 4.0 - 10.5 K/uL 0.6(LL)  Hemoglobin 13.0 - 17.0 g/dL 7.1(L)  Hematocrit 39.0 - 52.0 % 20.8(L)  Platelets 150 - 400 K/uL 65(L)    @IMAGES @  DG Chest 2 View  Result Date: 07/17/2020 CLINICAL DATA:  Cough, fever, shortness of breath. EXAM: CHEST - 2 VIEW COMPARISON:   Radiograph and CT 11/08/2019 FINDINGS: Post median sternotomy and CABG.The cardiomediastinal contours are normal. There is bronchial thickening. Pulmonary  vasculature is normal. No consolidation, pleural effusion, or pneumothorax. Exaggerated thoracic kyphosis with multilevel degenerative change in the thoracic spine. No acute osseous abnormalities are seen. Please note the anterior aspect of the sternum is not included in the field of view on the lateral. IMPRESSION: 1. Bronchial thickening without pneumonia. 2. Post CABG. Electronically Signed   By: Keith Rake M.D.   On: 07/17/2020 17:03   CT CHEST WO CONTRAST  Result Date: 07/20/2020 CLINICAL DATA:  Chest pain, shortness of breath. EXAM: CT CHEST WITHOUT CONTRAST TECHNIQUE: Multidetector CT imaging of the chest was performed following the standard protocol without IV contrast. COMPARISON:  CT chest, abdomen, pelvis 10/08/2019. CT chest 07/22/2014 FINDINGS: Cardiovascular: Normal heart size. No significant pericardial effusion. The thoracic aorta is normal in caliber. At least mild atherosclerotic plaque of the thoracic aorta. Four vessel coronary artery calcifications status post coronary artery bypass. Mediastinum/Nodes: No enlarged mediastinal or axillary lymph nodes. Thyroid gland in trachea demonstrate no significant findings. The esophageal wall appears slightly thickened distally. Lungs/Pleura: Markedly limited evaluation due to respiratory motion artifact. Interval development of suggestion of multifocal peribronchovascular ground-glass airspace opacities that are most prominent within the right upper lobe (24, 3:35, 56, 70). Pulmonary micronodule along the left major fissure (3:24) likely represents an intrapulmonary lymph node. Couple of dense pulmonary micronodules within right upper lobe (3:35). No pulmonary mass. No pleural effusion. No pneumothorax. Upper Abdomen: Redemonstration of a at least 2 cm calcified stone within the gallbladder  lumen. 1 cm fluid density lesion within the right hepatic lobe (2:103) that appears similar compared to prior CT abdomen pelvis. Otherwise no acute abnormality. Musculoskeletal: No chest wall abnormality. No suspicious lytic or blastic osseous lesions. No acute displaced fracture. Multilevel degenerative changes of the spine. IMPRESSION: 1. Markedly limited evaluation of the lung parenchyma due to respiratory motion artifact with suggestion of multifocal peribronchovascular ground-glass airspace opacities that could represent infection/inflammation. Recommend repeat CT in 3-6 months to evaluate for resolution as an underlying adenocarcinoma cannot be excluded. 2. Persistent distal esophageal wall thickening. Correlate with esophagitis. An underlying lesion cannot be excluded. 3. Cholelithiasis. Electronically Signed   By: Iven Finn M.D.   On: 07/20/2020 16:42   DG Chest Port 1 View  Result Date: 07/19/2020 CLINICAL DATA:  Increased shortness of breath EXAM: PORTABLE CHEST 1 VIEW COMPARISON:  07/18/2020 and prior FINDINGS: Mild hypoinflation. No pneumothorax or pleural effusion. Minimal bibasilar opacities. Diffuse interstitial prominence. Postsurgical appearance of the cardiomediastinal silhouette is unchanged. No acute osseous abnormality. IMPRESSION: Diffuse interstitial prominence and minimal bibasilar opacities, edema versus infection. Electronically Signed   By: Primitivo Gauze M.D.   On: 07/19/2020 07:53   DG Chest Port 1 View  Result Date: 07/18/2020 CLINICAL DATA:  Question sepsis EXAM: PORTABLE CHEST 1 VIEW COMPARISON:  07/17/2020 FINDINGS: Postop CABG.  Heart size normal.  Negative for heart failure. Mild bibasilar atelectasis has developed since yesterday. No effusion. IMPRESSION: Interval development of mild bibasilar atelectasis/infiltrate. Electronically Signed   By: Franchot Gallo M.D.   On: 07/18/2020 10:38     Assessment and plan- Patient is a 86 y.o. male with history of  hairy cell leukemia admitted for neutropenic fever  Patient is currently on Unasyn for possible bronchitis/pneumonia.  He has remained afebrile in the last 24 hours.  Patient has baseline pancytopenia secondary to his hairy cell leukemia.  Does not require any growth factor support at this time for his neutropenia.  Recommend transfusing 1 unit of irradiated PRBCs given that his hemoglobin  has dropped to 7.1.  Patient will follow up with Dr. [name] upon discharge   Visit Diagnosis 1. Neutropenic fever ([place])   2. Community acquired pneumonia, unspecified laterality   3. Shortness of breath      Dr. Randa Evens, MD, MPH [facility] at [facility] [phone] 07/22/2020 10:11 PM

## 2020-07-22 NOTE — Progress Notes (Signed)
Lab informed this R.N. that patient has a critical value of 0.6 WBC count. Will inform on-coming nurse of value.

## 2020-07-23 ENCOUNTER — Inpatient Hospital Stay: Payer: PPO

## 2020-07-23 LAB — CBC WITH DIFFERENTIAL/PLATELET
Abs Immature Granulocytes: 0 10*3/uL (ref 0.00–0.07)
Basophils Absolute: 0 10*3/uL (ref 0.0–0.1)
Basophils Relative: 0 %
Eosinophils Absolute: 0 10*3/uL (ref 0.0–0.5)
Eosinophils Relative: 3 %
HCT: 25 % — ABNORMAL LOW (ref 39.0–52.0)
Hemoglobin: 8.3 g/dL — ABNORMAL LOW (ref 13.0–17.0)
Immature Granulocytes: 0 %
Lymphocytes Relative: 4 %
Lymphs Abs: 0 10*3/uL — ABNORMAL LOW (ref 0.7–4.0)
MCH: 32.7 pg (ref 26.0–34.0)
MCHC: 33.2 g/dL (ref 30.0–36.0)
MCV: 98.4 fL (ref 80.0–100.0)
Monocytes Absolute: 0 10*3/uL — ABNORMAL LOW (ref 0.1–1.0)
Monocytes Relative: 3 %
Neutro Abs: 0.8 10*3/uL — ABNORMAL LOW (ref 1.7–7.7)
Neutrophils Relative %: 90 %
Platelets: 74 10*3/uL — ABNORMAL LOW (ref 150–400)
RBC: 2.54 MIL/uL — ABNORMAL LOW (ref 4.22–5.81)
RDW: 17.2 % — ABNORMAL HIGH (ref 11.5–15.5)
WBC: 0.9 10*3/uL — CL (ref 4.0–10.5)
nRBC: 0 % (ref 0.0–0.2)

## 2020-07-23 LAB — COMPREHENSIVE METABOLIC PANEL
ALT: 28 U/L (ref 0–44)
AST: 36 U/L (ref 15–41)
Albumin: 3.1 g/dL — ABNORMAL LOW (ref 3.5–5.0)
Alkaline Phosphatase: 49 U/L (ref 38–126)
Anion gap: 10 (ref 5–15)
BUN: 21 mg/dL (ref 8–23)
CO2: 26 mmol/L (ref 22–32)
Calcium: 8.4 mg/dL — ABNORMAL LOW (ref 8.9–10.3)
Chloride: 103 mmol/L (ref 98–111)
Creatinine, Ser: 1.12 mg/dL (ref 0.61–1.24)
GFR, Estimated: >60 mL/min (ref >60)
Glucose, Bld: 114 mg/dL — ABNORMAL HIGH (ref 70–99)
Potassium: 3.3 mmol/L — ABNORMAL LOW (ref 3.5–5.1)
Sodium: 139 mmol/L (ref 135–145)
Total Bilirubin: 2.7 mg/dL — ABNORMAL HIGH (ref 0.3–1.2)
Total Protein: 6.1 g/dL — ABNORMAL LOW (ref 6.5–8.1)

## 2020-07-23 LAB — CULTURE, BLOOD (ROUTINE X 2)
Culture: NO GROWTH
Special Requests: ADEQUATE

## 2020-07-23 LAB — PHOSPHORUS: Phosphorus: 2.9 mg/dL (ref 2.5–4.6)

## 2020-07-23 LAB — MAGNESIUM: Magnesium: 2.2 mg/dL (ref 1.7–2.4)

## 2020-07-23 MED ORDER — FLUCONAZOLE 100 MG PO TABS
100.0000 mg | ORAL_TABLET | Freq: Every day | ORAL | Status: DC
  Administered 2020-07-24: 10:00:00 100 mg via ORAL
  Filled 2020-07-23: qty 1

## 2020-07-23 MED ORDER — POTASSIUM CHLORIDE CRYS ER 20 MEQ PO TBCR
40.0000 meq | EXTENDED_RELEASE_TABLET | Freq: Once | ORAL | Status: AC
  Administered 2020-07-23: 09:00:00 40 meq via ORAL
  Filled 2020-07-23: qty 2

## 2020-07-23 MED ORDER — FLUCONAZOLE IN SODIUM CHLORIDE 200-0.9 MG/100ML-% IV SOLN
200.0000 mg | Freq: Once | INTRAVENOUS | Status: AC
  Administered 2020-07-23: 16:00:00 200 mg via INTRAVENOUS
  Filled 2020-07-23: qty 100

## 2020-07-23 MED ORDER — CLOTRIMAZOLE 10 MG MT TROC: 10.0000 mg | Freq: Every day | OROMUCOSAL | Status: DC

## 2020-07-23 NOTE — Plan of Care (Signed)
  Problem: Education: Goal: Knowledge of General Education information will improve Description: Including pain rating scale, medication(s)/side effects and non-pharmacologic comfort measures Outcome: Progressing   Problem: Clinical Measurements: Goal: Ability to maintain clinical measurements within normal limits will improve Outcome: Progressing   

## 2020-07-23 NOTE — Progress Notes (Signed)
PROGRESS NOTE    Frank Mcdaniel  STM:[id number] DOB: 08-24-1932 DOA: 07/18/2020 PCP: [name], MD   Chief Complaint  Patient presents with  . Neutropenia    Brief Narrative: Frank Mcdaniel is Frank Mcdaniel 86 y.o. male with medical history significant for anxiety, GERD, hypertension, anemia, history of recurrent hairy cell leukemia status post bone marrow biopsy on 11/02/19 which revealed recurrent hairy cell leukemia.  Patient was started on Rituxan and Cladribine in April, 2021.  It appears he did not receive any further chemotherapy until November, 29, 2021 when he received Frank Mcdaniel dose of Rituxan plus cladribine, cycle 1, day 1.  His son states that he ended up in the hospital after the initial treatment in March. About 2 days prior to presenting to the emergency room he developed Frank Mcdaniel fever with Frank Mcdaniel T-max of 101 F and Frank Mcdaniel wet sounding cough which is productive of occasional clear phlegm and was seen at the urgent care center where he was started on antibiotics for presumed acute bronchitis.  Patient went to the cancer center on the day of his admission for chemotherapy infusion and labs showed neutropenia.  He was also febrile with Frank Mcdaniel T-max of 100.7 F.  He was sent to the emergency room for further evaluation. He denies having any chest pain, no shortness of breath, no nausea, no vomiting no abdominal pain, no headache, no dizziness, no lightheadedness, no sore throat. Labs show sodium 134, potassium 4.3, chloride 104, bicarb 22, glucose 133, BUN 26, creatinine 1.34 calcium 8.4, alkaline phosphatase 47, albumin 3.3, AST 34, ALT 12, total protein 6.3, total bili 2.0, BNP 158, lactic acid 1.6, white cell count 0.7, hemoglobin 5.5, hematocrit 17, MCV 103, RDW 18.6, platelet count 71 Respiratory viral panel is negative Chest x ray reviewed by me shows mild bibasilar atelectasis/infiltrate. Twelve-lead EKG shows sinus rhythm, LVH and right bundle branch block  ED Course: Patient is an 86 year old male  sent from the cancer center to the emergency room for evaluation of Frank Mcdaniel fever with Frank Mcdaniel T-max of 100.7 F.  Patient has Frank Mcdaniel history of recurrent hairy cell leukemia and was recently started back on chemotherapy.  He was scheduled for an infusion on the day of admission but was sent to the ER due to marked neutropenia and fever.  He received Frank Mcdaniel dose of vancomycin and cefepime in the ER and will be admitted to the hospital for further evaluation.  Assessment & Plan:   Principal Problem:   Febrile neutropenia (HCC) Active Problems:   HTN (hypertension)   Hairy cell leukemia (HCC)   Anemia   Anxiety   Dementia without behavioral disturbance (HCC)   Pancytopenia (HCC)   CKD (chronic kidney disease), stage III (HCC)   Palliative care encounter  Febrile Neutropenia Community Acquired Pneumonia Enterococcus Faecalis UTI Last fever 12/14 ~1719 (temp to 100 on 12/15 PM) ANC 0.6 today Blood cx with staph hominis in 1 of 4 bottles - suspect this is contaminant  MRSA PCR negative.  Follow sputum cx if able. Urine cx with enterococcus faecalis, treat in setting of febrile neutropenia - sensitive to ampicillin Continue cefepime -> narrow to unasyn to cover enterococcus and pneumonia Oncology c/s, appreciate recommendations - they note 2/2 clardribine Repeat CXR today  Acute Hypoxic Respiratory Failure resolved, currently on RA CXR with diffuse interstitial prominence and minimal bibasilar opacities - edema vs infection Will give dose of lasix with blood today Echo 2021 with EF 89-21%, grade 1 diastolic dysfunction, see report CT chest with  multifocal peribronchovascular ground glass airspace opacities (recommending repeat CT in 3-6 months to eval for resolution) SLP eval - recommending dysphagia 3, nectar thick liquid ? If secretions related to reflux, esophageal dysmotility - follow with PPI (seems to be improving) - can consider discussion with GI, but with improvement, follow  Oropharyngeal  Candidiasis: fluconazole, follow  Distal Esophageal Wall Thickening: PPI for esophagitis (underlying lesion can't be excluded), will need outpatient follow up with GI, discussed with son today  Hypokalemia: replace and follow  Elevated Bilirubin: mild, improving, consider RUQ [place]  Pancytopenia Multifactorial and secondary to hairy cell leukemia as well as chemotherapy-induced S/p 3 unit pRBC  Monitor platelet count closely no indication for platelet transfusion at this time Appreciate oncology recs   Recurrent hairy cell leukemia On chemotherapy with oncology Follow outpatient with oncology  Coronary artery disease Continue nitrates as much as blood pressure tolerates Hold aspirin for now due to thrombocytopenia Continue statins  BPH Continue Flomax and finasteride  Dementia Continue Aricept  Chronic kidney disease, stage IIIa Renal function is stable We will monitor closely  Goals of care: palliative care c/s requested   DVT prophylaxis: SCD Code Status: DNR Family Communication: son at bedside Disposition:   Status is: Inpatient  Remains inpatient appropriate because:Inpatient level of care appropriate due to severity of illness   Dispo: The patient is from: Home              Anticipated d/c is to: pending              Anticipated d/c date is: > 3 days              Patient currently is not medically stable to d/c.     Consultants:   Palliative care  Procedures: none  Antimicrobials: Anti-infectives (From admission, onward)   Start     Dose/Rate Route Frequency Ordered Stop   07/22/20 1600  Ampicillin-Sulbactam (UNASYN) 3 g in sodium chloride 0.9 % 100 mL IVPB        3 g 200 mL/hr over 30 Minutes Intravenous Every 6 hours 07/22/20 1011     07/20/20 1800  Ampicillin-Sulbactam (UNASYN) 3 g in sodium chloride 0.9 % 100 mL IVPB  Status:  Discontinued        3 g 200 mL/hr over 30 Minutes Intravenous Every 8 hours 07/20/20 1655 07/22/20 1011    07/18/20 2200  ceFEPIme (MAXIPIME) 2 g in sodium chloride 0.9 % 100 mL IVPB  Status:  Discontinued        2 g 200 mL/hr over 30 Minutes Intravenous Every 12 hours 07/18/20 1236 07/20/20 1643   07/18/20 1115  vancomycin (VANCOREADY) IVPB 1500 mg/300 mL        1,500 mg 150 mL/hr over 120 Minutes Intravenous  Once 07/18/20 1037 07/18/20 1359   07/18/20 1015  ceFEPIme (MAXIPIME) 2 g in sodium chloride 0.9 % 100 mL IVPB        2 g 200 mL/hr over 30 Minutes Intravenous  Once 07/18/20 1008 07/18/20 1153   07/18/20 1015  vancomycin (VANCOCIN) IVPB 1000 mg/200 mL premix  Status:  Discontinued        1,000 mg 200 mL/hr over 60 Minutes Intravenous  Once 07/18/20 1008 07/18/20 1037     Subjective: No new complaints, son at bedside again today Some coughing today, bringing up more phlegm Objective: Vitals:   07/23/20 0345 07/23/20 0719 07/23/20 0731 07/23/20 1218  BP: (!) 114/48  (!) 125/54 113/60  Pulse: 80  81 66  Resp: 17  18 16   Temp: 98.4 F (36.9 C)  98.5 F (36.9 C) 98.5 F (36.9 C)  TempSrc: Axillary   Oral  SpO2: 96% 96% 95% 98%  Weight:      Height:        Intake/Output Summary (Last 24 hours) at 07/23/2020 1406 Last data filed at 07/23/2020 0400 Gross per 24 hour  Intake 2568.12 ml  Output 1750 ml  Net 818.12 ml   Filed Weights   07/18/20 0935  Weight: 76.1 kg    Examination:  General: No acute distress. Cardiovascular: Heart sounds show Hans Rusher regular rate, and rhythm.  Lungs: Clear to auscultation bilaterally  Abdomen: Soft, nontender, nondistended Neurological: Alert and oriented 3. Moves all extremities 4 . Cranial nerves II through XII grossly intact. Skin: Warm and dry. No rashes or lesions. Extremities: No clubbing or cyanosis. No edema.  Data Reviewed: I have personally reviewed following labs and imaging studies  CBC: Recent Labs  Lab 07/19/20 0132 07/20/20 0028 07/20/20 0509 07/21/20 0534 07/22/20 0538 07/22/20 1249 07/23/20 0413  WBC 0.8*   0.8*  --  0.8* 0.7* 0.6*  --  0.9*  NEUTROABS 0.8*  --  0.7* 0.6* 0.6*  --  0.8*  HGB 6.7*  6.7*   < > 8.4* 8.3* 7.1* 7.1* 8.3*  HCT 20.7*  20.0*   < > 24.5* 24.5* 20.8* 22.1* 25.0*  MCV 103.0*  101.0*  --  96.5 98.4 99.5  --  98.4  PLT 74*  75*  --  78* 75* 65*  --  74*   < > = values in this interval not displayed.    Basic Metabolic Panel: Recent Labs  Lab 07/19/20 0132 07/20/20 0509 07/21/20 0534 07/22/20 0538 07/23/20 0413  NA 138 142 142 143 139  K 3.5 3.1* 2.9* 3.5 3.3*  CL 108 108 105 109 103  CO2 21* 26 25 25 26   GLUCOSE 111* 105* 129* 126* 114*  BUN 25* 24* 29* 23 21  CREATININE 1.37* 1.20 1.38* 1.24 1.12  CALCIUM 8.3* 8.3* 8.6* 8.0* 8.4*  MG  --  2.3 2.4 2.2 2.2  PHOS  --  2.6 2.0* 3.3 2.9    GFR: Estimated Creatinine Clearance: 45.9 mL/min (by C-G formula based on SCr of 1.12 mg/dL).  Liver Function Tests: Recent Labs  Lab 07/19/20 0132 07/20/20 0509 07/21/20 0534 07/22/20 0538 07/23/20 0413  AST 30 34 36 44* 36  ALT 15 16 20 26 28   ALKPHOS 46 47 47 47 49  BILITOT 2.6* 2.7* 1.7* 1.2 2.7*  PROT 6.1* 6.3* 6.2* 5.6* 6.1*  ALBUMIN 3.3* 3.2* 3.2* 2.9* 3.1*    CBG: No results for input(s): GLUCAP in the last 168 hours.   Recent Results (from the past 240 hour(s))  COVID-19, Flu Pricsilla Lindvall+B and RSV ([facility])     Status: None   Collection Time: 07/17/20  5:24 PM   Specimen: Nasopharyngeal Swab  Result Value Ref Range Status   SARS-CoV-2, NAA Not Detected Not Detected Final    Comment: This nucleic acid amplification test was developed and its performance characteristics determined by [employer]. Nucleic acid amplification tests include RT-PCR and TMA. This test has not been FDA cleared or approved. This test has been authorized by FDA under an Emergency Use Authorization (EUA). This test is only authorized for the duration of time the declaration that circumstances exist justifying the authorization of the emergency use of in vitro diagnostic  tests for detection of SARS-CoV-2  virus and/or diagnosis of COVID-19 infection under section 564(b)(1) of the Act, 21 U.S.C. 161WRU-0(Adaria Hole) (1), unless the authorization is terminated or revoked sooner. When diagnostic testing is negative, the possibility of Kamani Lewter false negative result should be considered in the context of Yoon Barca patient's recent exposures and the presence of clinical signs and symptoms consistent with COVID-19. An individual without symptoms of COVID-19 and who is not shedding SARS-CoV-2 virus wo uld expect to have Virgie Kunda negative (not detected) result in this assay.    Influenza Seema Blum, NAA Not Detected Not Detected Final   Influenza B, NAA Not Detected Not Detected Final   RSV, NAA Not Detected Not Detected Final  Blood Culture (routine x 2)     Status: None   Collection Time: 07/18/20 10:10 AM   Specimen: BLOOD  Result Value Ref Range Status   Specimen Description BLOOD BLOOD RIGHT FOREARM  Final   Special Requests   Final    BOTTLES DRAWN AEROBIC AND ANAEROBIC Blood Culture adequate volume   Culture   Final    NO GROWTH 5 DAYS Performed at [facility], [address] [place], [place] [address]    Report Status 07/23/2020 FINAL  Final  Resp Panel by RT-PCR (Flu Dartanyan Deasis&B, Covid) Nasopharyngeal Swab     Status: None   Collection Time: 07/18/20 10:10 AM   Specimen: Nasopharyngeal Swab; Nasopharyngeal(NP) swabs in vial transport medium  Result Value Ref Range Status   SARS Coronavirus 2 by RT PCR NEGATIVE NEGATIVE Final    Comment: (NOTE) SARS-CoV-2 target nucleic acids are NOT DETECTED.  The SARS-CoV-2 RNA is generally detectable in upper respiratory specimens during the acute phase of infection. The lowest concentration of SARS-CoV-2 viral copies this assay can detect is 138 copies/mL. Shean Gerding negative result does not preclude SARS-Cov-2 infection and should not be used as the sole basis for treatment or other patient management decisions. Terre Zabriskie negative result may occur with   improper specimen collection/handling, submission of specimen other than nasopharyngeal swab, presence of viral mutation(s) within the areas targeted by this assay, and inadequate number of viral copies(<138 copies/mL). Golda Zavalza negative result must be combined with clinical observations, patient history, and epidemiological information. The expected result is Negative.  Fact Sheet for Patients:  EntrepreneurPulse.com.au  Fact Sheet for Healthcare Providers:  IncredibleEmployment.be  This test is no t yet approved or cleared by the [place] FDA and  has been authorized for detection and/or diagnosis of SARS-CoV-2 by FDA under an Emergency Use Authorization (EUA). This EUA will remain  in effect (meaning this test can be used) for the duration of the COVID-19 declaration under Section 564(b)(1) of the Act, 21 U.S.C.section 360bbb-3(b)(1), unless the authorization is terminated  or revoked sooner.       Influenza Kayleana Waites by PCR NEGATIVE NEGATIVE Final   Influenza B by PCR NEGATIVE NEGATIVE Final    Comment: (NOTE) The Xpert Xpress SARS-CoV-2/FLU/RSV plus assay is intended as an aid in the diagnosis of influenza from Nasopharyngeal swab specimens and should not be used as Liridona Mashaw sole basis for treatment. Nasal washings and aspirates are unacceptable for Xpert Xpress SARS-CoV-2/FLU/RSV testing.  Fact Sheet for Patients: EntrepreneurPulse.com.au  Fact Sheet for Healthcare Providers: IncredibleEmployment.be  This test is not yet approved or cleared by the [place] FDA and has been authorized for detection and/or diagnosis of SARS-CoV-2 by FDA under an Emergency Use Authorization (EUA). This EUA will remain in effect (meaning this test can be used) for the duration of the COVID-19 declaration under  Section 564(b)(1) of the Act, 21 U.S.C. section 360bbb-3(b)(1), unless the authorization is terminated  or revoked.  Performed at [facility], [address] [place], [place] [address]   Blood Culture (routine x 2)     Status: Abnormal   Collection Time: 07/18/20 10:47 AM   Specimen: BLOOD  Result Value Ref Range Status   Specimen Description   Final    BLOOD LEFT ANTECUBITAL Performed at [facility], [address] [place], [place] [address]    Special Requests   Final    BOTTLES DRAWN AEROBIC AND ANAEROBIC Blood Culture results may not be optimal due to an excessive volume of blood received in culture bottles Performed at [facility], [address], [place], [place] [address]    Culture  Setup Time   Final    GRAM POSITIVE COCCI AEROBIC BOTTLE ONLY Organism ID to follow CRITICAL RESULT CALLED TO, READ BACK BY AND VERIFIED WITH: Wandalee Ferdinand RN [phone] 07/19/20 HNM Performed at [facility], [address] [place], [place] [address]    Culture (Trenia Tennyson)  Final    STAPHYLOCOCCUS HOMINIS THE SIGNIFICANCE OF ISOLATING THIS ORGANISM FROM Shandel Busic SINGLE SET OF BLOOD CULTURES WHEN MULTIPLE SETS ARE DRAWN IS UNCERTAIN. PLEASE NOTIFY THE MICROBIOLOGY DEPARTMENT WITHIN ONE WEEK IF SPECIATION AND SENSITIVITIES ARE REQUIRED. Performed at [facility], [place] [address], [place], [place] [address]    Report Status 07/21/2020 FINAL  Final  Blood Culture ID Panel (Reflexed)     Status: Abnormal   Collection Time: 07/18/20 10:47 AM  Result Value Ref Range Status   Enterococcus faecalis NOT DETECTED NOT DETECTED Final   Enterococcus Faecium NOT DETECTED NOT DETECTED Final   Listeria monocytogenes NOT DETECTED NOT DETECTED Final   Staphylococcus species DETECTED (Cordella Nyquist) NOT DETECTED Final    Comment: CRITICAL RESULT CALLED TO, READ BACK BY AND VERIFIED WITH: Wandalee Ferdinand RN [phone] 07/19/20 HNM    Staphylococcus aureus (BCID) NOT DETECTED NOT DETECTED Final   Staphylococcus epidermidis NOT DETECTED NOT DETECTED Final   Staphylococcus lugdunensis NOT DETECTED NOT  DETECTED Final   Streptococcus species NOT DETECTED NOT DETECTED Final   Streptococcus agalactiae NOT DETECTED NOT DETECTED Final   Streptococcus pneumoniae NOT DETECTED NOT DETECTED Final   Streptococcus pyogenes NOT DETECTED NOT DETECTED Final   Famous Eisenhardt.calcoaceticus-baumannii NOT DETECTED NOT DETECTED Final   Bacteroides fragilis NOT DETECTED NOT DETECTED Final   Enterobacterales NOT DETECTED NOT DETECTED Final   Enterobacter cloacae complex NOT DETECTED NOT DETECTED Final   Escherichia coli NOT DETECTED NOT DETECTED Final   Klebsiella aerogenes NOT DETECTED NOT DETECTED Final   Klebsiella oxytoca NOT DETECTED NOT DETECTED Final   Klebsiella pneumoniae NOT DETECTED NOT DETECTED Final   Proteus species NOT DETECTED NOT DETECTED Final   Salmonella species NOT DETECTED NOT DETECTED Final   Serratia marcescens NOT DETECTED NOT DETECTED Final   Haemophilus influenzae NOT DETECTED NOT DETECTED Final   Neisseria meningitidis NOT DETECTED NOT DETECTED Final   Pseudomonas aeruginosa NOT DETECTED NOT DETECTED Final   Stenotrophomonas maltophilia NOT DETECTED NOT DETECTED Final   Candida albicans NOT DETECTED NOT DETECTED Final   Candida auris NOT DETECTED NOT DETECTED Final   Candida glabrata NOT DETECTED NOT DETECTED Final   Candida krusei NOT DETECTED NOT DETECTED Final   Candida parapsilosis NOT DETECTED NOT DETECTED Final   Candida tropicalis NOT DETECTED NOT DETECTED Final   Cryptococcus neoformans/gattii NOT DETECTED NOT DETECTED Final    Comment: Performed at  [facility], [address] [place], [place] [address]  Urine culture     Status: Abnormal   Collection Time: 07/18/20  1:51 PM   Specimen: In/Out Cath Urine  Result Value Ref Range Status   Specimen Description   Final    IN/OUT CATH URINE Performed at [facility], [address] [place], [place] [address]    Special Requests   Final    NONE Performed at [facility], [place], [place] [address]    Culture 50,000 COLONIES/mL ENTEROCOCCUS FAECALIS (Amiera Herzberg)  Final   Report Status 07/21/2020 FINAL  Final   Organism ID, Bacteria ENTEROCOCCUS FAECALIS (Roger Kettles)  Final      Susceptibility   Enterococcus faecalis - MIC*    AMPICILLIN <=2 SENSITIVE Sensitive     NITROFURANTOIN <=16 SENSITIVE Sensitive     VANCOMYCIN 1 SENSITIVE Sensitive     * 50,000 COLONIES/mL ENTEROCOCCUS FAECALIS  MRSA PCR Screening     Status: None   Collection Time: 07/19/20  8:26 AM   Specimen: Nasopharyngeal  Result Value Ref Range Status   MRSA by PCR NEGATIVE NEGATIVE Final    Comment:        The GeneXpert MRSA Assay (FDA approved for NASAL specimens only), is one component of Desani Sprung comprehensive MRSA colonization surveillance program. It is not intended to diagnose MRSA infection nor to guide or monitor treatment for MRSA infections. Performed at [facility], [address] [place], [place] [address]   Respiratory Panel by PCR     Status: None   Collection Time: 07/19/20  1:30 PM   Specimen: Nasopharyngeal Swab; Respiratory  Result Value Ref Range Status   Adenovirus NOT DETECTED NOT DETECTED Final   Coronavirus 229E NOT DETECTED NOT DETECTED Final    Comment: (NOTE) The Coronavirus on the Respiratory Panel, DOES NOT test for the novel  Coronavirus (2019 nCoV)    Coronavirus HKU1 NOT DETECTED NOT DETECTED Final   Coronavirus NL63 NOT DETECTED NOT DETECTED Final   Coronavirus OC43 NOT DETECTED NOT DETECTED Final   Metapneumovirus NOT DETECTED NOT DETECTED Final   Rhinovirus / Enterovirus NOT DETECTED NOT DETECTED Final   Influenza Selenne Coggin NOT DETECTED NOT DETECTED Final   Influenza B NOT DETECTED NOT DETECTED Final   Parainfluenza Virus 1 NOT DETECTED NOT DETECTED Final   Parainfluenza Virus 2 NOT DETECTED NOT DETECTED Final   Parainfluenza Virus 3 NOT DETECTED NOT DETECTED Final   Parainfluenza Virus 4 NOT DETECTED NOT DETECTED Final   Respiratory Syncytial Virus NOT DETECTED  NOT DETECTED Final   Bordetella pertussis NOT DETECTED NOT DETECTED Final   Bordetella Parapertussis NOT DETECTED NOT DETECTED Final   Chlamydophila pneumoniae NOT DETECTED NOT DETECTED Final   Mycoplasma pneumoniae NOT DETECTED NOT DETECTED Final    Comment: Performed at [facility], [place]. [address] [place], [place] [address]         Radiology Studies: No results found.      Scheduled Meds: . sodium chloride   Intravenous Once  . sodium chloride   Intravenous Once  . albuterol  2.5 mg Nebulization BID  . atorvastatin  20 mg Oral Daily  . donepezil  10 mg Oral QHS  . finasteride  5 mg Oral Daily  . isosorbide mononitrate  30 mg Oral Daily  . multivitamin with minerals  1 tablet Oral Daily  . pantoprazole (PROTONIX) IV  40 mg Intravenous Q24H  . tamsulosin  0.8 mg Oral QHS  Continuous Infusions: . sodium chloride 100 mL/hr at 07/19/20 0727  . ampicillin-sulbactam (UNASYN) IV 3 g (07/23/20 1219)  . dextrose 5% lactated ringers with KCl 20 mEq/L 75 mL/hr at 07/23/20 1032     LOS: 5 days    Time spent: over 30 min    Fayrene Helper, MD [facility]   To contact the attending provider between 7A-7P or the covering provider during after hours 7P-7A, please log into the web site [email or web address] and access using universal [place] password for that web site. If you do not have the password, please call the hospital operator.  07/23/2020, 2:06 PM

## 2020-07-23 NOTE — Progress Notes (Signed)
NP made aware of patient's critical WBC count of 0.9. No new orders.

## 2020-07-23 NOTE — Progress Notes (Signed)
Physical Therapy Treatment Patient Details Name: Frank Mcdaniel MRN: [id number] DOB: 11/20/1932 Today's Date: 07/23/2020    History of Present Illness Pt admitted for febrile neutropenia. HIstory of anxiety, GERD, HTN, anemia, dementia, and hairy cell leukemia.    PT Comments    Ready for session. Pt is able to get to EOB and complete 1 lap around unit with RW and min guard.  Cues to step up into walker but overall does well with self selected gait speed and distance.  Son in.  Stated he did not use RW at home prior but stated it would be helpful to have at home for discharge.    Remained in recliner after session for breakfast.   Follow Up Recommendations  Home health PT;Supervision - Intermittent     Equipment Recommendations  Rolling walker with 5" wheels    Recommendations for Other Services       Precautions / Restrictions Precautions Precautions: Fall Restrictions Weight Bearing Restrictions: No    Mobility  Bed Mobility Overal bed mobility: Needs Assistance Bed Mobility: Supine to Sit     Supine to sit: Min guard        Transfers Overall transfer level: Needs assistance Equipment used: Rolling walker (2 wheeled) Transfers: Sit to/from Stand Sit to Stand: Min guard         General transfer comment: vc's for hand placements  Ambulation/Gait Ambulation/Gait assistance: Min guard Gait Distance (Feet): 160 Feet Assistive device: Rolling walker (2 wheeled) Gait Pattern/deviations: Step-through pattern Gait velocity: decreased   General Gait Details: cues to keep walker close to him.  Did not use walker prior to admit   Stairs             Wheelchair Mobility    Modified Rankin (Stroke Patients Only)       Balance Overall balance assessment: Needs assistance Sitting-balance support: Feet supported Sitting balance-Leahy Scale: Good     Standing balance support: Bilateral upper extremity supported Standing balance-Leahy Scale: Fair                               Cognition Arousal/Alertness: Awake/alert Behavior During Therapy: WFL for tasks assessed/performed Overall Cognitive Status: Within Functional Limits for tasks assessed                                        Exercises      General Comments        Pertinent Vitals/Pain Pain Assessment: No/denies pain    Home Living                      Prior Function            PT Goals (current goals can now be found in the care plan section) Progress towards PT goals: Progressing toward goals    Frequency    Min 2X/week      PT Plan      Co-evaluation              AM-PAC PT "6 Clicks" Mobility   Outcome Measure  Help needed turning from your back to your side while in a flat bed without using bedrails?: A Little Help needed moving from lying on your back to sitting on the side of a flat bed without using bedrails?: A Little Help needed moving to and  from a bed to a chair (including a wheelchair)?: A Little Help needed standing up from a chair using your arms (e.g., wheelchair or bedside chair)?: A Little Help needed to walk in hospital room?: A Little Help needed climbing 3-5 steps with a railing? : A Little 6 Click Score: 18    End of Session Equipment Utilized During Treatment: Gait belt Activity Tolerance: Patient tolerated treatment well Patient left: in chair;with nursing/sitter in room;with family/visitor present Nurse Communication: Mobility status PT Visit Diagnosis: Muscle weakness (generalized) (M62.81);Difficulty in walking, not elsewhere classified (R26.2);Unsteadiness on feet (R26.81)     Time: 3167-4255 PT Time Calculation (min) (ACUTE ONLY): 15 min  Charges:  $Gait Training: 8-22 mins                    Chesley Noon, PTA 07/23/20, 10:42 AM

## 2020-07-23 NOTE — Consult Note (Signed)
Pharmacy Antibiotic Note  Frank Mcdaniel is a 86 y.o. male admitted on 07/18/2020 with Oropharangeal candidiasis. Pt here with pneumonia and enterococcus UTI on Unasyn. Pt presented from cancer center with febrile neutropenia. 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 Pt being treated with Rituxan and Cladribine in April 2021 - pt then did not receive any further chemo until Jul 03, 2020 - on same regimen C1D1. Pharmacy has been consulted for fluconazole dosing.  Afebrile, WBC 0.7>0.6>0.9  Plan: fluconazole 200 mg IV x 1 dose, followed by fluconazole 100 mg PO x 7 doses  Height: 5\' 5"  (165.1 cm) Weight: 76.1 kg (167 lb 12.3 oz) IBW/kg (Calculated) : 61.5  Temp (24hrs), Avg:98.8 F (37.1 C), Min:98.2 F (36.8 C), Max:99.6 F (37.6 C)  Recent Labs  Lab 07/18/20 1010 07/18/20 1604 07/19/20 0132 07/20/20 0509 07/21/20 0534 07/22/20 0538 07/23/20 0413  WBC  --   --  0.8*  0.8* 0.8* 0.7* 0.6* 0.9*  CREATININE  --   --  1.37* 1.20 1.38* 1.24 1.12  LATICACIDVEN 1.6 0.9  --   --   --   --   --     Estimated Creatinine Clearance: 45.9 mL/min (by C-G formula based on SCr of 1.12 mg/dL).    No Known Allergies  Antimicrobials this admission: 12/16 Unasyn >> 12/14 cefepime >> 12/16 12/14 vancomycin x 1  Microbiology results: 12/14 BCx: staphylococcus hominis 12/14 UCx: enterococcus faecalis 12/15 MRSA PCR: negative  Thank you for allowing pharmacy to be a part of this patient's care.  Benn Moulder, PharmD Pharmacy Resident  07/23/2020 2:22 PM

## 2020-07-24 LAB — CBC WITH DIFFERENTIAL/PLATELET
Abs Immature Granulocytes: 0 10*3/uL (ref 0.00–0.07)
Basophils Absolute: 0 10*3/uL (ref 0.0–0.1)
Basophils Relative: 0 %
Eosinophils Absolute: 0 10*3/uL (ref 0.0–0.5)
Eosinophils Relative: 5 %
HCT: 25.7 % — ABNORMAL LOW (ref 39.0–52.0)
Hemoglobin: 8.5 g/dL — ABNORMAL LOW (ref 13.0–17.0)
Immature Granulocytes: 0 %
Lymphocytes Relative: 9 %
Lymphs Abs: 0.1 10*3/uL — ABNORMAL LOW (ref 0.7–4.0)
MCH: 33.1 pg (ref 26.0–34.0)
MCHC: 33.1 g/dL (ref 30.0–36.0)
MCV: 100 fL (ref 80.0–100.0)
Monocytes Absolute: 0 10*3/uL — ABNORMAL LOW (ref 0.1–1.0)
Monocytes Relative: 5 %
Neutro Abs: 0.5 10*3/uL — ABNORMAL LOW (ref 1.7–7.7)
Neutrophils Relative %: 81 %
Platelets: 75 10*3/uL — ABNORMAL LOW (ref 150–400)
RBC: 2.57 MIL/uL — ABNORMAL LOW (ref 4.22–5.81)
RDW: 17 % — ABNORMAL HIGH (ref 11.5–15.5)
Smear Review: NORMAL
WBC: 0.7 10*3/uL — CL (ref 4.0–10.5)
nRBC: 0 % (ref 0.0–0.2)

## 2020-07-24 LAB — TYPE AND SCREEN
ABO/RH(D): A POS
Antibody Screen: NEGATIVE
Unit division: 0

## 2020-07-24 LAB — BPAM RBC
Blood Product Expiration Date: 202201092359
ISSUE DATE / TIME: 202112190040
Unit Type and Rh: 600

## 2020-07-24 LAB — COMPREHENSIVE METABOLIC PANEL
ALT: 22 U/L (ref 0–44)
AST: 24 U/L (ref 15–41)
Albumin: 2.9 g/dL — ABNORMAL LOW (ref 3.5–5.0)
Alkaline Phosphatase: 50 U/L (ref 38–126)
Anion gap: 7 (ref 5–15)
BUN: 21 mg/dL (ref 8–23)
CO2: 26 mmol/L (ref 22–32)
Calcium: 8.4 mg/dL — ABNORMAL LOW (ref 8.9–10.3)
Chloride: 108 mmol/L (ref 98–111)
Creatinine, Ser: 1.16 mg/dL (ref 0.61–1.24)
GFR, Estimated: >60 mL/min (ref >60)
Glucose, Bld: 112 mg/dL — ABNORMAL HIGH (ref 70–99)
Potassium: 3.7 mmol/L (ref 3.5–5.1)
Sodium: 141 mmol/L (ref 135–145)
Total Bilirubin: 1.2 mg/dL (ref 0.3–1.2)
Total Protein: 5.7 g/dL — ABNORMAL LOW (ref 6.5–8.1)

## 2020-07-24 LAB — MAGNESIUM: Magnesium: 2.2 mg/dL (ref 1.7–2.4)

## 2020-07-24 LAB — PHOSPHORUS: Phosphorus: 2.2 mg/dL — ABNORMAL LOW (ref 2.5–4.6)

## 2020-07-24 MED ORDER — AMOXICILLIN-POT CLAVULANATE 875-125 MG PO TABS: 1.0000 | ORAL_TABLET | Freq: Two times a day (BID) | ORAL | 0 refills | Status: AC

## 2020-07-24 MED ORDER — K PHOS MONO-SOD PHOS DI & MONO 155-852-130 MG PO TABS
500.0000 mg | ORAL_TABLET | ORAL | Status: DC
  Administered 2020-07-24: 16:00:00 500 mg via ORAL
  Filled 2020-07-24 (×4): qty 2

## 2020-07-24 MED ORDER — PANTOPRAZOLE SODIUM 40 MG PO TBEC: 40.0000 mg | DELAYED_RELEASE_TABLET | Freq: Every day | ORAL | 1 refills | Status: DC

## 2020-07-24 MED ORDER — FLUCONAZOLE 100 MG PO TABS: 100.0000 mg | ORAL_TABLET | Freq: Every day | ORAL | 0 refills | Status: AC

## 2020-07-24 NOTE — Plan of Care (Signed)

## 2020-07-24 NOTE — Progress Notes (Signed)
[place]  [phone] [phone]  ID: Frank ZANETTI OB: 05-06-1933  MR#: [id number]  GEZ#:[id number]  Patient Care Team: [name], MD as PCP - General (Family Medicine) Rockey Situ Kathlene November, MD as PCP - Cardiology (Cardiology) [name], MD as Consulting Physician (Cardiology) [name], MD as Consulting Physician (Oncology) [name] as Physician Assistant (Urology)  CHIEF COMPLAINT: Hairy cell leukemia, neutropenic fever secondary to pneumonia.  INTERVAL HISTORY: Patient significantly improved and no longer has an oxygen requirement.  He still seems somewhat confused, but improved over the last several days.  He continues to weakness and fatigue, but offers no further complaints.  REVIEW OF SYSTEMS:   Review of Systems  Constitutional: Positive for malaise/fatigue. Negative for fever and weight loss.  Respiratory: Negative.  Negative for cough, hemoptysis and shortness of breath.   Cardiovascular: Negative.  Negative for chest pain and leg swelling.  Gastrointestinal: Negative for abdominal pain.  Genitourinary: Negative.  Negative for dysuria.  Musculoskeletal: Negative.   Skin: Negative.  Negative for rash.  Neurological: Positive for weakness. Negative for dizziness, focal weakness and headaches.  Psychiatric/Behavioral: Positive for memory loss.    As per HPI. Otherwise, a complete review of systems is negative.  PAST MEDICAL HISTORY: Past Medical History:  Diagnosis Date  . Anemia 09/01/2015  . Anxiety 09/11/2015  . Arthritis   . Asthma    as a teenager  . Asymptomatic Sinus Bradycardia   . Atypical chest pain    a. 08/2014  . BPH (benign prostatic hyperplasia)   . CKD (chronic kidney disease), stage III ([place])   . Closed fracture of tuft of distal phalanx of finger 08/31/2018  . Coronary artery disease    a. 2000 s/p CABG;  b. 10/2008 Neg MV, EF 64%; c. cath 05/2015: LM 85% sev cal,  pLAD-1 lesion 80%, pLAD-2 lesion 100% chronic, ostLCx 70% sev cal, OM4 90%, ostRCA 70%, mid RCA 90%, LIMA-LAD patent, VG-OM2 patent, VG-OM3 patent, VG-RPDA patent. No AS  . Dementia without behavioral disturbance ([place]) 05/28/2019  . GERD (gastroesophageal reflux disease)   . H/O echocardiogram    a. 10/2005 Echo: nl EF.  [name] Hairy cell leukemia ([place])   . HOH (hard of hearing)    Bilateral hearing aids  . HTN (hypertension)   . Hyperlipidemia   . Hypertension   . Leukemia ([place])   . Neutropenic fever ([place]) 11/08/2019  . Stricture and stenosis of esophagus     PAST SURGICAL HISTORY: Past Surgical History:  Procedure Laterality Date  . CARDIAC CATHETERIZATION N/A 05/26/2015   Procedure: Left Heart Cath;  Surgeon: [name], MD;  Location: [place] [facility];  Service: Cardiovascular;  Laterality: N/A;  . CARDIAC CATHETERIZATION N/A 05/26/2015   Procedure: Coronary/Graft Angiography;  Surgeon: [name], MD;  Location: [place] [facility];  Service: Cardiovascular;  Laterality: N/A;  . CATARACT EXTRACTION    . CORONARY ARTERY BYPASS GRAFT  2000  . ESOPHAGOGASTRODUODENOSCOPY N/A 02/18/2017   Procedure: ESOPHAGOGASTRODUODENOSCOPY (EGD) with removal of food bolus;  Surgeon: [name], MD;  Location: [facility] ENDOSCOPY;  Service: Endoscopy;  Laterality: N/A;  . ESOPHAGOGASTRODUODENOSCOPY (EGD) WITH PROPOFOL N/A 03/11/2017   Procedure: ESOPHAGOGASTRODUODENOSCOPY (EGD) WITH PROPOFOL;  Surgeon: [name], MD;  Location: [facility] ENDOSCOPY;  Service: Endoscopy;  Laterality: N/A;  . ESOPHAGOGASTRODUODENOSCOPY (EGD) WITH PROPOFOL N/A 04/15/2017   Procedure: ESOPHAGOGASTRODUODENOSCOPY (EGD) WITH PROPOFOL;  Surgeon: [name], MD;  Location: [facility] ENDOSCOPY;  Service: Endoscopy;  Laterality: N/A;  . EYE SURGERY Left    Cataract Extraction with IOL  . INGUINAL HERNIA REPAIR Left 06/24/2016   Procedure: HERNIA REPAIR INGUINAL ADULT;  Surgeon: [name], MD;  Location: [facility] ORS;   Service: General;  Laterality: Left;  . TONSILLECTOMY  1942   . UPPER GASTROINTESTINAL ENDOSCOPY  3 years ago    with Dilation.     FAMILY HISTORY: Family History  Problem Relation Age of Onset  . Hypertension Mother   . Arthritis Father   . Other Other        no premature CAD.  [name] Colon cancer Neg Hx   . Prostate cancer Neg Hx   . Stomach cancer Neg Hx     ADVANCED DIRECTIVES (Y/N):  @ADVDIR @  HEALTH MAINTENANCE: Social History   Tobacco Use  . Smoking status: Never Smoker  . Smokeless tobacco: Never Used  Vaping Use  . Vaping Use: Never used  Substance Use Topics  . Alcohol use: No  . Drug use: No     Colonoscopy:  PAP:  Bone density:  Lipid panel:  No Known Allergies  Current Facility-Administered Medications  Medication Dose Route Frequency Provider Last Rate Last Admin  . 0.9 %  sodium chloride infusion (Manually program via Guardrails IV Fluids)   Intravenous Once Agbata, Tochukwu, MD      . 0.9 %  sodium chloride infusion (Manually program via Guardrails IV Fluids)   Intravenous Once Elodia [place]., MD      . 0.9 %  sodium chloride infusion   Intravenous Continuous Agbata, Tochukwu, MD 100 mL/hr at 07/19/20 0727 Infusion Verify at 07/19/20 0727  . acetaminophen (TYLENOL) tablet 650 mg  650 mg Oral Q6H PRN Agbata, Tochukwu, MD   650 mg at 07/23/20 0450   Or  . acetaminophen (TYLENOL) suppository 650 mg  650 mg Rectal Q6H PRN Agbata, Tochukwu, MD      . albuterol (PROVENTIL) (2.5 MG/3ML) 0.083% nebulizer solution 2.5 mg  2.5 mg Nebulization BID Elodia [place]., MD   2.5 mg at 07/24/20 0733  . Ampicillin-Sulbactam (UNASYN) 3 g in sodium chloride 0.9 % 100 mL IVPB  3 g Intravenous Q6H Oswald Hillock, RPH 200 mL/hr at 07/24/20 1112 3 g at 07/24/20 1112  . atorvastatin (LIPITOR) tablet 20 mg  20 mg Oral Daily Agbata, Tochukwu, MD   20 mg at 07/24/20 1019  . dextrose 5% in lactated ringers with KCl 20 mEq/L infusion   Intravenous Continuous Elodia [place]., MD 75 mL/hr at 07/24/20 0546 New Bag at 07/24/20 0546  . donepezil (ARICEPT) tablet 10 mg  10 mg Oral QHS Agbata, Tochukwu, MD   10 mg at 07/23/20 2006  . finasteride (PROSCAR) tablet 5 mg  5 mg Oral Daily Agbata, Tochukwu, MD   5 mg at 07/24/20 1019  . fluconazole (DIFLUCAN) tablet 100 mg  100 mg Oral Daily Deatra Robinson B, RPH   100 mg at 07/24/20 1019  . haloperidol (HALDOL) tablet 1 mg  1 mg Oral Q6H PRN Elodia [place]., MD   1 mg at 07/23/20 0450   Or  . haloperidol lactate (HALDOL) injection 1 mg  1 mg Intramuscular Q6H PRN Elodia [place]., MD   1 mg at 07/19/20 1531  . isosorbide mononitrate (IMDUR) 24 hr tablet 30 mg  30 mg Oral Daily Agbata, Tochukwu, MD   30 mg at 07/24/20 1019  . multivitamin with minerals tablet 1 tablet  1 tablet Oral Daily Agbata, Tochukwu, MD   1 tablet at 07/24/20 1019  . ondansetron (ZOFRAN) tablet 4 mg  4 mg Oral Q6H PRN Agbata, Tochukwu, MD       Or  . ondansetron (ZOFRAN) injection 4 mg  4 mg Intravenous Q6H PRN Agbata, Tochukwu, MD      . pantoprazole (PROTONIX) injection 40 mg  40 mg Intravenous Q24H Elodia [place]., MD   40 mg at 07/24/20 1019  . tamsulosin (FLOMAX) capsule 0.8 mg  0.8 mg Oral QHS Agbata, Tochukwu, MD   0.8 mg at 07/23/20 2006    OBJECTIVE: Vitals:   07/24/20 1323 07/24/20 1330  BP:    Pulse:    Resp:    Temp:    SpO2: 96% 97%     Body mass index is 27.92 kg/m.    ECOG FS:3 - Symptomatic, >50% confined to bed  General: Thin, no acute distress. Eyes: Pink conjunctiva, anicteric sclera. HEENT: Normocephalic, moist mucous membranes. Lungs: No audible wheezing or coughing. Heart: Regular rate and rhythm. Abdomen: Soft, nontender, no obvious distention. Musculoskeletal: No edema, cyanosis, or clubbing. Neuro: Alert, answering all questions appropriately. Cranial nerves grossly intact. Skin: No rashes or petechiae noted. Psych: Normal affect.  LAB RESULTS:  Lab Results  Component Value  Date   NA 141 07/24/2020   K 3.7 07/24/2020   CL 108 07/24/2020   CO2 26 07/24/2020   GLUCOSE 112 (H) 07/24/2020   BUN 21 07/24/2020   CREATININE 1.16 07/24/2020   CALCIUM 8.4 (L) 07/24/2020   PROT 5.7 (L) 07/24/2020   ALBUMIN 2.9 (L) 07/24/2020   AST 24 07/24/2020   ALT 22 07/24/2020   ALKPHOS 50 07/24/2020   BILITOT 1.2 07/24/2020   GFRNONAA >60 07/24/2020   GFRAA 50 (L) 02/08/2020    Lab Results  Component Value Date   WBC 0.7 (LL) 07/24/2020   NEUTROABS 0.5 (L) 07/24/2020   HGB 8.5 (L) 07/24/2020   HCT 25.7 (L) 07/24/2020   MCV 100.0 07/24/2020   PLT 75 (L) 07/24/2020     STUDIES: DG Chest 2 View  Result Date: 07/17/2020 CLINICAL DATA:  Cough, fever, shortness of breath. EXAM: CHEST - 2 VIEW COMPARISON:  Radiograph and CT 11/08/2019 FINDINGS: Post median sternotomy and CABG.The cardiomediastinal contours are normal. There is bronchial thickening. Pulmonary vasculature is normal. No consolidation, pleural effusion, or pneumothorax. Exaggerated thoracic kyphosis with multilevel degenerative change in the thoracic spine. No acute osseous abnormalities are seen. Please note the anterior aspect of the sternum is not included in the field of view on the lateral. IMPRESSION: 1. Bronchial thickening without pneumonia. 2. Post CABG. Electronically Signed   By: Keith Rake M.D.   On: 07/17/2020 17:03   CT CHEST WO CONTRAST  Result Date: 07/20/2020 CLINICAL DATA:  Chest pain, shortness of breath. EXAM: CT CHEST WITHOUT CONTRAST TECHNIQUE: Multidetector CT imaging of the chest was performed following the standard protocol without IV contrast. COMPARISON:  CT chest, abdomen, pelvis 10/08/2019. CT chest 07/22/2014 FINDINGS: Cardiovascular: Normal heart size. No significant pericardial effusion. The thoracic aorta is normal in caliber. At least mild atherosclerotic plaque of the thoracic aorta. Four vessel coronary artery calcifications status post coronary artery bypass.  Mediastinum/Nodes: No enlarged mediastinal or axillary lymph nodes. Thyroid gland in trachea demonstrate no significant findings. The esophageal wall appears slightly thickened distally. Lungs/Pleura: Markedly limited evaluation due to respiratory motion artifact. Interval development of suggestion of multifocal peribronchovascular ground-glass airspace opacities that are most prominent within  the right upper lobe (24, 3:35, 56, 70). Pulmonary micronodule along the left major fissure (3:24) likely represents an intrapulmonary lymph node. Couple of dense pulmonary micronodules within right upper lobe (3:35). No pulmonary mass. No pleural effusion. No pneumothorax. Upper Abdomen: Redemonstration of a at least 2 cm calcified stone within the gallbladder lumen. 1 cm fluid density lesion within the right hepatic lobe (2:103) that appears similar compared to prior CT abdomen pelvis. Otherwise no acute abnormality. Musculoskeletal: No chest wall abnormality. No suspicious lytic or blastic osseous lesions. No acute displaced fracture. Multilevel degenerative changes of the spine. IMPRESSION: 1. Markedly limited evaluation of the lung parenchyma due to respiratory motion artifact with suggestion of multifocal peribronchovascular ground-glass airspace opacities that could represent infection/inflammation. Recommend repeat CT in 3-6 months to evaluate for resolution as an underlying adenocarcinoma cannot be excluded. 2. Persistent distal esophageal wall thickening. Correlate with esophagitis. An underlying lesion cannot be excluded. 3. Cholelithiasis. Electronically Signed   By: Iven Finn M.D.   On: 07/20/2020 16:42   DG Chest Port 1 View  Result Date: 07/23/2020 CLINICAL DATA:  Neutropenia.  History of asthma. EXAM: PORTABLE CHEST 1 VIEW COMPARISON:  July 19, 2020 FINDINGS: New bibasilar infiltrates. The heart, hila, mediastinum, lungs, and pleura are otherwise unremarkable. IMPRESSION: New bibasilar  infiltrates worrisome for pneumonia. Electronically Signed   By: Dorise Bullion III M.D   On: 07/23/2020 14:51   DG Chest Port 1 View  Result Date: 07/19/2020 CLINICAL DATA:  Increased shortness of breath EXAM: PORTABLE CHEST 1 VIEW COMPARISON:  07/18/2020 and prior FINDINGS: Mild hypoinflation. No pneumothorax or pleural effusion. Minimal bibasilar opacities. Diffuse interstitial prominence. Postsurgical appearance of the cardiomediastinal silhouette is unchanged. No acute osseous abnormality. IMPRESSION: Diffuse interstitial prominence and minimal bibasilar opacities, edema versus infection. Electronically Signed   By: Primitivo Gauze M.D.   On: 07/19/2020 07:53   DG Chest Port 1 View  Result Date: 07/18/2020 CLINICAL DATA:  Question sepsis EXAM: PORTABLE CHEST 1 VIEW COMPARISON:  07/17/2020 FINDINGS: Postop CABG.  Heart size normal.  Negative for heart failure. Mild bibasilar atelectasis has developed since yesterday. No effusion. IMPRESSION: Interval development of mild bibasilar atelectasis/infiltrate. Electronically Signed   By: Franchot Gallo M.D.   On: 07/18/2020 10:38    ASSESSMENT: Hairy cell leukemia, neutropenic fever secondary to pneumonia.  PLAN:    1.  Hairy cell leukemia: Patient's most recent infusion of rituximab was approximately 2 weeks ago.  His appointment previously scheduled for tomorrow has been rescheduled for next week.  We will determine at that time when to reinitiate treatment. 2.  Neutropenic fever: Patient remains neutropenic likely secondary to cladribine given several weeks ago.  He has now afebrile and okay to discharge to home.   3.  Anemia: Improved with transfusion.    Current hemoglobin is 8.5. 4.  Thrombocytopenia: Chronic and unchanged. 5.   Community-acquired pneumonia: Shortness of breath and oxygen requirement have improved. 6.  Disposition: Possible discharge home today.  Appreciate consult.   [name], MD   07/24/2020 1:36  PM

## 2020-07-24 NOTE — TOC Initial Note (Signed)
Transition of Care [facility]) - Initial/Assessment Note    Patient Details  Name: Frank Mcdaniel MRN: [id number] Date of Birth: Jan 05, 1933  Transition of [facility]) CM/SW Contact:    Shelbie Ammons, RN Phone Number: 07/24/2020, 3:11 PM  Clinical Narrative:   RNCM met with patient and son at bedside. Patient resting in bed with eyes closed, easily arousable but lets son do all of the talking. Discussed current recommendations for home health and a rolling walker. Patient's son is agreeable to these recommendations and does not have a preference as to which agency.  RNCM reached out to [facility] with [facility], he will see if they can accept patient.  RNCM reached out to [facility] with [facility] and she will provide rolling walker.            Expected Discharge Plan: [place] Barriers to Discharge: No Barriers Identified   Patient Goals and CMS Choice        Expected Discharge Plan and Services Expected Discharge Plan: [place]       Living arrangements for the past 2 months: Single Family Home                 DME Arranged: Walker rolling DME Agency: [employer] Date DME Agency Contacted: 07/24/20 Time DME Agency Contacted: 1245 Representative spoke with at DME Agency: [place]: RN,PT,Nurse's Aide [facility] Agency: [place] ([place]) Date [place]: 07/24/20 Time HH Agency Contacted: 59 Representative spoke with at [place]: Corene Cornea  Prior Living Arrangements/Services Living arrangements for the past 2 months: [place] Lives with:: Adult Children Patient language and need for interpreter reviewed:: Yes Do you feel safe going back to the place where you live?: Yes      Need for Family Participation in Patient Care: Yes (Comment) Care giver support system in place?: Yes (comment)   Criminal Activity/Legal Involvement Pertinent to Current Situation/Hospitalization: No - Comment as needed  Activities of  Daily Living      Permission Sought/Granted                  Emotional Assessment Appearance:: Appears stated age Attitude/Demeanor/Rapport: Guarded Affect (typically observed): Calm,Appropriate Orientation: : Oriented to Self,Oriented to Place,Oriented to  Time,Oriented to Situation Alcohol / Substance Use: Not Applicable Psych Involvement: No (comment)  Admission diagnosis:  Shortness of breath [R06.02] Febrile neutropenia ([place]) [D70.9, R50.81] Neutropenic fever ([place]) [D70.9, R50.81] Community acquired pneumonia, unspecified laterality [J18.9] Patient Active Problem List   Diagnosis Date Noted  . Palliative care encounter   . Febrile neutropenia ([place]) 07/18/2020  . CKD (chronic kidney disease), stage III ([place]) 02/28/2020  . Pancytopenia ([place]) 01/07/2020  . C. difficile diarrhea 12/31/2019  . BRBPR (bright red blood per rectum) 12/31/2019  . Abnormal EKG 11/23/2019  . IBS (irritable bowel syndrome) 08/02/2019  . Dementia without behavioral disturbance ([place]) 05/28/2019  . Status post inguinal hernia repair 09/13/2016  . Constipation 05/21/2016  . Memory loss or impairment 02/15/2016  . Chronic fatigue 12/21/2015  . Bilateral low back pain without sciatica 12/21/2015  . Anxiety 09/11/2015  . Anemia 09/01/2015  . GERD (gastroesophageal reflux disease) 06/02/2015  . Asthma 01/28/2015  . BPH (benign prostatic hyperplasia) 01/28/2015  . Hairy cell leukemia ([place]) 01/28/2015  . Coronary artery disease   . S/P CABG (coronary artery bypass graft) 11/08/2010  . HTN (hypertension) 11/08/2010  . Bradycardia 11/08/2010  . Hyperlipidemia 10/27/2009   PCP:  [name], MD  Pharmacy:   [id number] [place], [place] [id number]: [phone]: [phone]    Social Determinants of Health (SDOH) Interventions    Readmission Risk Interventions Readmission Risk Prevention Plan 11/10/2019  Transportation  Screening Complete  PCP or Specialist Appt within 3-5 Days Complete  HRI or Home Care Consult Complete  Medication Review (RN Care Manager) Complete  Some recent data might be hidden

## 2020-07-24 NOTE — Progress Notes (Signed)
Speech Language Pathology Treatment: Dysphagia  Patient Details Name: Frank Mcdaniel MRN: [id number] DOB: 11/13/1932 Today's Date: 07/24/2020 Time: 2694-8546 SLP Time Calculation (min) (ACUTE ONLY): 50 min  Assessment / Plan / Recommendation Clinical Impression  Pt seen for toleration of diet and trials to upgrade diet if appropriate. He appears much improved w/ less laryngopharyngeal secretions and ongoing phlegm; few coughs w/ expectoration of phlegm only during session prior to po's; few noted post all po's given. Vocal quality c/b raspiness, hoarseness and low volume. On RA. Actually, prior to any po's, pt's vocal quality was dry upon verbalizaing w/ SLP and Son. Pt was alert, quiet, and eager for po's; has a Baseline of Dementia and required verbal cues for follow through w/ po tasks but able to follow instructions w/ min cues. Pt is on RA; wbc wnl. Pt is HOH. MD is treating pt w/ IV PPI hopefully addressing phellm/secretions from a GI standpoint -- aware of pt's longstanding h/o Esophageal dysmotility and recent chemotherapy txs.  Pt explained general aspiration precautions and agreed verbally to the need for following them especially sitting upright for all oral intake. Pt assisted Fully w/ positioning then given trials of thin liquids, ice chips. No overt coughing was noted w/ trials of thin liquids Via Cup; no immediate or delayed coughing. Vocal quality was unchanged during/post trials; respiratory status remained calm and unlabored. Pt helped to hold Cup when drinking following instructions for single, small sips slowly when give Verbal Cues -- less follow through when not monitored/cued. NO straws were utiilized for better oral control. Oral phase appeared grossly [facility] for bolus management and timely A-P transfer for swallowing; oral clearing achieved w/ all consistencies. Increased Belching noted post all sips of liquids w/ dry coughing at end of all trials - unsure if related to the  Esophagus. Encouraged room temp liquids if more comfortable.  Recommend upgrade to Dysphagia level 3 diet (mech soft) w/ gravies added to moisten foods; Thin liquids VIA CUP. Recommend general aspiration precautions, No Straws; Pills Whole in Puree - NSG will practice w/ pt and Son this PM; tray setup and positioning assistance for meals w/ monitoring for following aspiration precautions w/ all oral intake/meals. ST services will continue to f/u w/ pt for toleration of diet, as indicated, and education while admitted. Son, MD/NSG updated. Precautions posted at bedside. Handouts give on general aspiration precautions; Esophageal dysmotility(baseline for pt); and a mech soft diet for ease of swallowing and Esophageal motility. Recommended f/u w/ GI(Esophageal dysmotility management) and ENT(for assessment of vocal cords if needed next ~2 weeks).   HPI HPI: Pt is a 86 y.o. male with medical history significant for Dementai, CKD, CAD, anxiety, GERD, hypertension, anemia, history of recurrent hairy cell leukemia status post bone marrow biopsy on 11/02/19 which revealed recurrent hairy cell leukemia.  Patient was started on Rituxan and Cladribine in April, 2021.  It appears he did not receive any further chemotherapy until November, 29, 2021 when he received a dose of Rituxan plus cladribine, cycle 1, day 1.  His son states that he ended up in the hospital after the initial treatment in March.  Per son he has had cough, nasal congestion and shortness of breath for the past few days.  Symptoms been constant worsening.  Fever today of 101.9.  Has not had any medicines.  Per son he was outside doing yard work yesterday and felt pretty good.  No known Covid exposure.  Has been fully vaccinated with booster.  Pt also  has a longstanding h/o Esophageal Stricture/Dilation -- the last time in 2018. Pt has minor c/o of Esophageal dysmotility at home -- any such can increase Esophageal phlegm which can move into the pharynx.  MD  is treating phlegmy secretions w/ PPI.      SLP Plan  Continue with current plan of care       Recommendations  Diet recommendations: Dysphagia 3 (mechanical soft);Thin liquid Liquids provided via: Cup;No straw Medication Administration: Whole meds with puree Supervision: Patient able to self feed;Staff to assist with self feeding;Intermittent supervision to cue for compensatory strategies Compensations: Minimize environmental distractions;Slow rate;Small sips/bites;Lingual sweep for clearance of pocketing;Follow solids with liquid Postural Changes and/or Swallow Maneuvers: Seated upright 90 degrees;Upright 30-60 min after meal;Out of bed for meals                General recommendations:  (Dietician f/u) Oral Care Recommendations: Oral care BID;Staff/trained caregiver to provide oral care Follow up Recommendations: None (TBD) SLP Visit Diagnosis: Dysphagia, oropharyngeal phase (R13.12) Plan: Continue with current plan of care       [place], [place], [place] Pathologist Rehab Services [phone] [facility] 07/24/2020, 4:13 PM

## 2020-07-24 NOTE — Consult Note (Addendum)
Pharmacy Antibiotic Note  Frank Mcdaniel is a 86 y.o. male admitted on 07/18/2020 with pneumonia and enterococcus UTI as well as oropharyngeal candidiasis . Pt presented from cancer center with febrile neutropenia. 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 Pt being treated with Rituxan and Cladribine in April 2021 - pt then did not receive any further chemo until Jul 03, 2020 - on same regimen C1D1. Pharmacy has been consulted for Unasyn and Fluconazole dosing.  Patient remains afebrile today.   Day # 5 Unasyn   Plan: Continue Unasyn 3 g q8h  Monitor Scr    Height: 5\' 5"  (165.1 cm) Weight: 76.1 kg (167 lb 12.3 oz) IBW/kg (Calculated) : 61.5  Temp (24hrs), Avg:98.4 F (36.9 C), Min:98 F (36.7 C), Max:98.9 F (37.2 C)  Recent Labs  Lab 07/18/20 1010 07/18/20 1604 07/19/20 0132 07/20/20 0509 07/21/20 0534 07/22/20 0538 07/23/20 0413 07/24/20 0626  WBC  --   --    < > 0.8* 0.7* 0.6* 0.9* 0.7*  CREATININE  --   --    < > 1.20 1.38* 1.24 1.12 1.16  LATICACIDVEN 1.6 0.9  --   --   --   --   --   --    < > = values in this interval not displayed.    Estimated Creatinine Clearance: 44.3 mL/min (by C-G formula based on SCr of 1.16 mg/dL).    No Known Allergies  Antimicrobials this admission: 12/16 Unasyn >> 12/14 cefepime >>  12/14 vancomycin x 1 dose 12/19 Fluconazole >>   Microbiology results: 12/14 BCx: 1/4 staph hominis - likely contaminant 12/14 UCx: 50,000 colonies/mL enterococcus faecalis (sensitive to ampicillin) 12/15 Sputum: pending 12/15 MRSA PCR: negative 12/14 resp. Panel negative  Thank you for allowing pharmacy to be a part of this patient's care.  Rowland Lathe, PharmD  07/24/2020 1:22 PM

## 2020-07-24 NOTE — Consult Note (Deleted)
Pharmacy Antibiotic Note  Frank Mcdaniel is a 86 y.o. male admitted on 07/18/2020 with pneumonia and enterococcus UTI. Pt presented from cancer center with febrile neutropenia. 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 Pt being treated with Rituxan and Cladribine in April 2021 - pt then did not receive any further chemo until Jul 03, 2020 - on same regimen C1D1. Pharmacy has been consulted for Unasyn dosing.  Most recent fever 12/15 @ 2200: 100, ANC 0.7, LA 0.9, WBC 0.8, PCT 1.90  CT chest: Markedly limited evaluation of the lung parenchyma due to respiratory motion artifact with suggestion of multifocal peribronchovascular ground-glass airspace opacities that could represent infection/inflammation  Plan: Continue Unasyn 3 g q8h  Monitor Scr    Height: 5\' 5"  (165.1 cm) Weight: 76.1 kg (167 lb 12.3 oz) IBW/kg (Calculated) : 61.5  Temp (24hrs), Avg:98.4 F (36.9 C), Min:98 F (36.7 C), Max:98.9 F (37.2 C)  Recent Labs  Lab 07/18/20 1010 07/18/20 1604 07/19/20 0132 07/20/20 0509 07/21/20 0534 07/22/20 0538 07/23/20 0413 07/24/20 0626  WBC  --   --    < > 0.8* 0.7* 0.6* 0.9* 0.7*  CREATININE  --   --    < > 1.20 1.38* 1.24 1.12 1.16  LATICACIDVEN 1.6 0.9  --   --   --   --   --   --    < > = values in this interval not displayed.    Estimated Creatinine Clearance: 44.3 mL/min (by C-G formula based on SCr of 1.16 mg/dL).    No Known Allergies  Antimicrobials this admission: 12/16 Unasyn >> 12/14 cefepime >>  12/14 vancomycin x 1 dose 12/19 Fluconazole >>   Microbiology results: 12/14 BCx: 1/4 staph hominis - likely contaminant 12/14 UCx: 50,000 colonies/mL enterococcus faecalis (sensitive to ampicillin0 12/15 Sputum: pending 12/15 MRSA PCR: negative 12/14 resp. Panel negative  Thank you for allowing pharmacy to be a part of this patient's care.  Rowland Lathe, PharmD Pharmacy Resident  07/24/2020 1:21  PM

## 2020-07-24 NOTE — Discharge Summary (Signed)
Physician Discharge Summary  Frank Mcdaniel OHY:[id number] DOB: 1933-07-20 DOA: 07/18/2020  PCP: Frank Mcdaniel  Admit date: 07/18/2020 Discharge date: 07/24/2020  Time spent: 40 minutes  Recommendations for Outpatient Follow-up:  1. Follow outpt CBC/CMP 2. Follow with heme/onc outpatient 3. Follow with GI outpatient regarding distal esophageal wall thickening  4. Needs repeat CT chest in 3-6 months  5. Dysphagia 3, thin liquid diet - meds with puree 6. Aspirin held with thrombocytopenia, follow outpatient    Discharge Diagnoses:  Principal Problem:   Febrile neutropenia ([place]) Active Problems:   HTN (hypertension)   Hairy cell leukemia ([place])   Anemia   Anxiety   Dementia without behavioral disturbance (HCC)   Pancytopenia (HCC)   CKD (chronic kidney disease), stage III ([place])   Palliative care encounter   Discharge Condition: stable  Diet recommendation: dysphagia 3, thin liquids  Filed Weights   07/18/20 0935  Weight: 76.1 kg    History of present illness:  Frank E Lackeyis Frank Mcdaniel 86 y.o.malewith medical history significant foranxiety, GERD, hypertension, anemia, history of recurrent hairy cell leukemia status post bone marrow biopsyon 03/30/21which revealed recurrent hairy cell leukemia. Patient was started on Frank Mcdaniel and Cladribinein April, 2021. It appears he did not receive any further chemotherapy until November, 29, 2021when he received Frank Mcdaniel dose ofRituxan pluscladribine,cycle 1,day 1.His son states that he ended up in the hospital after the initial treatment in March. About 2daysprior to presenting to the emergency room he developed Frank Mcdaniel fever with Frank Mcdaniel T-max of 101 Fand Frank Mcdaniel wet sounding cough which is productive of occasional clear phlegmand was seen at the urgent care center where he was started on antibiotics for presumed acute bronchitis.Patient went to the cancer center on the day of his admission for chemotherapy infusion and labs showed  neutropenia.He was also febrile with Frank Mcdaniel T-max of 100.7 F.He was sent to the emergency room for further evaluation. He denies having any chest pain, no shortness of breath, no nausea, no vomiting no abdominal pain, no headache, no dizziness, no lightheadedness, no sore throat. Labs show sodium 134, potassium 4.3, chloride 104, bicarb 22, glucose 133, BUN 26, creatinine 1.34 calcium 8.4, alkaline phosphatase 47, albumin 3.3, AST 34, ALT 12, total protein 6.3, total bili 2.0, BNP 158, lactic acid 1.6, white cell count 0.7, hemoglobin 5.5, hematocrit 17, MCV 103, RDW 18.6, platelet count 71 Respiratory viral panel is negative Chest x ray reviewed by me shows mild bibasilar atelectasis/infiltrate. Twelve-lead EKG shows sinus rhythm, LVH and right bundle branch block  ED Course:Patient is an 86 year old male sent from the cancer center to the emergency room for evaluation of Frank Mcdaniel fever with Frank Mcdaniel T-max of 100.6F.Patient has Frank Mcdaniel history of recurrent hairy cell leukemia and was recently started back on chemotherapy. He was scheduled for an infusion on the day of admission but was sent to the ER due to marked neutropenia and fever.He received Frank Mcdaniel in the ER and will be admitted to the hospital for further evaluation  He was admitted with febrile neutropenia and found to have community acquired pneumonia and enterococcus faecalis UTI.  He had imaging with distal esophageal thickening and he was started on Frank Mcdaniel PPI.  He's stable on 12/20 and plan is for d/c with outpatient oncology follow up.  Hospital Course:  Febrile Neutropenia Community Acquired Pneumonia Enterococcus Faecalis UTI Last fever 12/14 ~1719 (temp to 100 on 12/15 PM) ANC 0.5 today Blood cx with staph hominis in 1 of 4 bottles - **Note De-Identified vi Obfusction** suspect this is contminnt  Frank Mcdaniel.  Follow sputum cx if ble. Urine cx with enterococcus feclis, tret in setting of febrile neutropeni - sensitive to mpicillin Continue  Mcdaniel -> nrrow to unsyn to cover enterococcus nd pneumoni -> will dischrge with ugmentin  Oncology c/s, pprecite recommendtions - they note 2/2 clrdribine Repet CXR with bibsilr infiltrtes - he's doing well on R, follow with bx t dischrge  cute Hypoxic Respirtory Filure resolved, currently on R - mintining sts with ctivity CXR with diffuse interstitil prominence nd miniml bibsilr opcities - edem vs infection Echo 2021 with EF 95-28%, grde 1 distolic dysfunction, see report CT chest with multifocl peribronchovsculr ground glss irspce opcities (recommending repet CT in 3-6 months to evl for resolution) SLP evl - recommending dysphgi 3, thin liquid, meds with puree ? If secretions relted to reflux, esophgel dysmotility - follow with PPI (seems to be improving) - cn consider discussion with GI, but with improvement, follow with GI outptient   Orophryngel Cndidisis: fluconzole, follow  Distl Esophgel Wll Thickening: PPI for esophgitis (underlying lesion cn't be excluded), will need outptient follow up with GI, discussed with son   Hypoklemi: replce nd follow  Elevted Bilirubin: improved, follow outptient   Pncytopeni Multifctoril nd secondry to hiry cell leukemi s well s chemotherpy-induced S/p 3 unit pRBC  Monitor pltelet count closely no indiction for pltelet trnsfusion t this time pprecite oncology recs   Recurrent hiry cell leukemi On chemotherpy with oncology Follow outptient with oncology  Coronry rtery disese Continue nitrtes s much s blood pressure tolertes Hold spirin for now due to thrombocytopeni Continue sttins  BPH Continue Flomx nd finsteride  Dementi Continue ricept  Chronic kidney disese,stge III Renl function is stble We will monitor closely  Gols of cre: pllitive cre c/s requested - follow outptient with cncer  center  Procedures:  none  Consulttions:  oncology  Dischrge Exm: Vitls:   07/24/20 1330 07/24/20 1529  BP:  (!) 115/47  Pulse:  (!) 51  Resp:  16  Temp:  98.6 F (37 C)  SpO2: 97% 98%   No new complints &Ox2 Son t bedside  Generl: No cute distress. Crdiovsculr: Hert sounds show  regulr rte, nd rhythm.  Lungs: corse lung sounds bdomen: Soft, nontender, nondistended  Neurologicl: lert nd oriented 2. Moves ll extremities 4 . Crnil nerves II through XII grossly intct. Skin: Wrm nd dry. No rshes or lesions. Extremities: No clubbing or cynosis. No edem Dischrge Instructions   Dischrge Instructions    Cll Mcdaniel for:  difficulty brething, hedche or visul disturbnces   Complete by: s directed    Cll Mcdaniel for:  extreme ftigue   Complete by: s directed    Cll Mcdaniel for:  hives   Complete by: s directed    Cll Mcdaniel for:  persistnt dizziness or light-hededness   Complete by: s directed    Cll Mcdaniel for:  persistnt nuse nd vomiting   Complete by: s directed    Cll Mcdaniel for:  redness, tenderness, or signs of infection (pin, swelling, redness, odor or green/yellow dischrge round incision site)   Complete by: s directed    Cll Mcdaniel for:  severe uncontrolled pin   Complete by: s directed    Cll Mcdaniel for:  temperture >100.4   Complete by: s directed    DIET DYS 3   Complete by: s directed    Pills with puree   Fluid consistency: Thin   Dischrge instructions **Note De-Identified vi Obfusction** Complete by: As directed    You were seen for  fever nd low white blood cell count.  You've improved with ntibiotics.    We'll dischrge you home with ntitibiotics in ddition to n cid blocker nd ntifungl.  You hd distl esophgel thickening on your imging tht could be representtive of esophgitis (inflmmtion).  We'll tret this with protonix (cid blocker), but you should follow up with gstroenterology outptient to see if they think dditionl  tests/studies or procedures should be done.  You need  repet CT scn in 3-6 months to ensure your pneumoni resolves nd tht no further workup is needed.   We'll tret you for presumed thrush with fluconzole.   Return for new, recurrent, or worsening symptoms.  Plese sk your PCP to request records from this hospitliztion so they know wht ws done nd wht the next steps will be.   Increse ctivity slowly   Complete by: As directed    Increse ctivity slowly   Complete by: As directed      Allergies s of 07/24/2020   No Known Allergies     Mediction List    STOP tking these medictions   spirin EC 81 MG tblet   zithromycin 250 MG tblet Commonly known s: Zithromx Z-Pk     TAKE these medictions   moxicillin-clvulnte 875-125 MG tblet Commonly known s: Augmentin Tke 1 tblet by mouth 2 (two) times dily for 3 dys.   torvsttin 40 MG tblet Commonly known s: LIPITOR TAKE 1/2 TABLETS (20 MG TOTAL) BY MOUTH DAILY. Wht chnged: See the new instructions.   Clinpro 5000 1.1 % Pste Generic drug: Sodium Fluoride 1 ppliction s directed. USE IN PLACE OF REGULAR TOOTHPASTE. USE PEASIZED AMOUNT, BRUSH DAILY AT LEAST 2 MINUTES. SPIT OUT.   donepezil 10 MG tblet Commonly known s: ARICEPT Tke 1 tblet (10 mg totl) by mouth t bedtime.   finsteride 5 MG tblet Commonly known s: PROSCAR TAKE 1 TABLET BY MOUTH EVERY DAY   fluconzole 100 MG tblet Commonly known s: DIFLUCAN Tke 1 tblet (100 mg totl) by mouth dily for 6 dys. Strt tking on: July 25, 2020   isosorbide mononitrte 30 MG 24 hr tblet Commonly known s: IMDUR TAKE 1 TABLET BY MOUTH EVERY DAY   multivitmin tblet Tke 1 tblet by mouth dily.   ondnsetron 8 MG tblet Commonly known s: Zofrn Tke 1 tblet (8 mg totl) by mouth 2 (two) times dily s needed (Nuse or vomiting).   pntoprzole 40 MG tblet Commonly known s: Protonix Tke 1 tblet (40 mg  totl) by mouth dily.   prochlorperzine 10 MG tblet Commonly known s: COMPAZINE Tke 1 tblet (10 mg totl) by mouth every 6 (six) hours s needed (Nuse or vomiting).   tmsulosin 0.4 MG Cps cpsule Commonly known s: FLOMAX TAKE 2 CAPSULES BY MOUTH EVERY DAY Wht chnged:   how much to tke  how to tke this  when to tke this   TUMS CHEWY BITES PO Tke 1 tblet by mouth s needed.            [facility] Equipment  (From dmission, onwrd)         Strt     Ordered   07/24/20 1402  DME Wlker  Once       Question Answer Comment  Wlker: With [place] Whe[employer]   Ptient needs  wlker to tret with the following condition Physicl deconditioning      07/24/20 1403  07/23/20 1401  For home use only DME Walker rolling  Once       Question Answer Comment  Walker: With [place]   Patient needs Joeann [place]pe walker to treat with the following condition Physical deconditioning      07/23/20 1401         No Known Allergies    The results of significant diagnostics from this hospitalization (including imaging, microbiology, ancillary and laboratory) are listed below for reference.    Significant Diagnostic Studies: DG Chest 2 View  Result Date: 07/17/2020 CLINICAL DATA:  Cough, fever, shortness of breath. EXAM: CHEST - 2 VIEW COMPARISON:  Radiograph and CT 11/08/2019 FINDINGS: Post median sternotomy and CABG.The cardiomediastinal contours are normal. There is bronchial thickening. Pulmonary vasculature is normal. No consolidation, pleural effusion, or pneumothorax. Exaggerated thoracic kyphosis with multilevel degenerative change in the thoracic spine. No acute osseous abnormalities are seen. Please note the anterior aspect of the sternum is not included in the field of view on the lateral. IMPRESSION: 1. Bronchial thickening without pneumonia. 2. Post CABG. Electronically Signed   By: Keith Rake M.D.   On: 07/17/2020 17:03   CT CHEST WO CONTRAST  Result  Date: 07/20/2020 CLINICAL DATA:  Chest pain, shortness of breath. EXAM: CT CHEST WITHOUT CONTRAST TECHNIQUE: Multidetector CT imaging of the chest was performed following the standard protocol without IV contrast. COMPARISON:  CT chest, abdomen, pelvis 10/08/2019. CT chest 07/22/2014 FINDINGS: Cardiovascular: Normal heart size. No significant pericardial effusion. The thoracic aorta is normal in caliber. At least mild atherosclerotic plaque of the thoracic aorta. Four vessel coronary artery calcifications status post coronary artery bypass. Mediastinum/Nodes: No enlarged mediastinal or axillary lymph nodes. Thyroid gland in trachea demonstrate no significant findings. The esophageal wall appears slightly thickened distally. Lungs/Pleura: Markedly limited evaluation due to respiratory motion artifact. Interval development of suggestion of multifocal peribronchovascular ground-glass airspace opacities that are most prominent within the right upper lobe (24, 3:35, 56, 70). Pulmonary micronodule along the left major fissure (3:24) likely represents an intrapulmonary lymph node. Couple of dense pulmonary micronodules within right upper lobe (3:35). No pulmonary mass. No pleural effusion. No pneumothorax. Upper Abdomen: Redemonstration of Dayla Gasca at least 2 cm calcified stone within the gallbladder lumen. 1 cm fluid density lesion within the right hepatic lobe (2:103) that appears similar compared to prior CT abdomen pelvis. Otherwise no acute abnormality. Musculoskeletal: No chest wall abnormality. No suspicious lytic or blastic osseous lesions. No acute displaced fracture. Multilevel degenerative changes of the spine. IMPRESSION: 1. Markedly limited evaluation of the lung parenchyma due to respiratory motion artifact with suggestion of multifocal peribronchovascular ground-glass airspace opacities that could represent infection/inflammation. Recommend repeat CT in 3-6 months to evaluate for resolution as an underlying  adenocarcinoma cannot be excluded. 2. Persistent distal esophageal wall thickening. Correlate with esophagitis. An underlying lesion cannot be excluded. 3. Cholelithiasis. Electronically Signed   By: Iven Finn M.D.   On: 07/20/2020 16:42   DG Chest Port 1 View  Result Date: 07/23/2020 CLINICAL DATA:  Neutropenia.  History of asthma. EXAM: PORTABLE CHEST 1 VIEW COMPARISON:  July 19, 2020 FINDINGS: New bibasilar infiltrates. The heart, hila, mediastinum, lungs, and pleura are otherwise unremarkable. IMPRESSION: New bibasilar infiltrates worrisome for pneumonia. Electronically Signed   By: Dorise Bullion III M.D   On: 07/23/2020 14:51   DG Chest Port 1 View  Result Date: 07/19/2020 CLINICAL DATA:  Increased shortness of breath EXAM: PORTABLE CHEST 1 VIEW COMPARISON:  07/18/2020 and prior FINDINGS: Mild **Note De-Identified vi Obfusction** hypoinfltion. No pneumothorx or pleurl effusion. Miniml bibsilr opcities. Diffuse interstitil prominence. Postsurgicl ppernce of the crdiomedistinl silhouette is unchnged. No cute osseous bnormlity. IMPRESSION: Diffuse interstitil prominence nd miniml bibsilr opcities, edem versus infection. Electroniclly Signed   By: Primitivo Guze M.D.   On: 07/19/2020 07:53   DG Chest Port 1 View  Result Dte: 07/18/2020 CLINICL DT:  Question sepsis EXM: PORTBLE CHEST 1 VIEW COMPRISON:  07/17/2020 FINDINGS: Postop CBG.  Hert size norml.  Mcdaniel for hert filure. Mild bibsilr telectsis hs developed since yesterdy. No effusion. IMPRESSION: Intervl development of mild bibsilr telectsis/infiltrte. Electroniclly Signed   By: Frnchot Gllo M.D.   On: 07/18/2020 10:38    Microbiology: Recent Results (from the pst 240 hour(s))  COVID-19, Flu +B nd RSV ([employer])     Sttus: None   Collection Time: 07/17/20  5:24 PM   Specimen: Nsophryngel Swb  Result Vlue Ref Rnge Sttus   SRS-CoV-2, N Not Detected Not Detected Finl    Comment: This  nucleic cid mplifiction test ws developed nd its performnce chrcteristics determined by [employer]. Nucleic cid mplifiction tests include RT-PCR nd TM. This test hs not been FD clered or pproved. This test hs been uthorized by FD under n Emergency Use uthoriztion (EU). This test is only uthorized for the durtion of time the declrtion tht circumstnces exist justifying the uthoriztion of the emergency use of in vitro dignostic tests for detection of SRS-CoV-2 virus nd/or dignosis of COVID-19 infection under section 564(b)(1) of the ct, 21 U.S.C. 704UGQ-9(V) (1), unless the uthoriztion is terminted or revoked sooner. When dignostic testing is Mcdaniel, the possibility of  flse Mcdaniel result should be considered in the context of  ptient's recent exposures nd the presence of clinicl signs nd symptoms consistent with COVID-19. n individul without symptoms of COVID-19 nd who is not shedding SRS-CoV-2 virus wo uld expect to hve  Mcdaniel (not detected) result in this ssy.    Influenz , N Not Detected Not Detected Finl   Influenz B, N Not Detected Not Detected Finl   RSV, N Not Detected Not Detected Finl  Blood Culture (routine x 2)     Sttus: None   Collection Time: 07/18/20 10:10 M   Specimen: BLOOD  Result Vlue Ref Rnge Sttus   Specimen Description BLOOD BLOOD RIGHT FORERM  Finl   Specil Requests   Finl    BOTTLES DRWN EROBIC ND NEROBIC Blood Culture dequte volume   Culture   Finl    NO GROWTH 5 DYS Performed t [facility], [address] [place], [place] [address]    Report Sttus 07/23/2020 FINL  Finl  Resp Pnel by RT-PCR (Flu &B, Covid) Nsophryngel Swb     Sttus: None   Collection Time: 07/18/20 10:10 M   Specimen: Nsophryngel Swb; Nsophryngel(NP) swbs in vil trnsport medium  Result Vlue Ref Rnge Sttus   SRS Coronvirus 2 by RT PCR Mcdaniel Mcdaniel  Finl    Comment: (NOTE) SRS-CoV-2 trget nucleic cids re NOT DETECTED.  The SRS-CoV-2 RN is generlly detectble in upper respirtory specimens during the cute phse of infection. The lowest concentrtion of SRS-CoV-2 virl copies this ssy cn detect is 138 copies/mL.  Mcdaniel result does not preclude SRS-Cov-2 infection nd should not be used s the sole bsis for tretment or other ptient mngement decisions.  Mcdaniel result my occur with  improper specimen collection/hndling, submission of specimen other thn nsophryngel swb, presence of virl muttion(s) within the res trgeted by this **Note De-Identified vi Obfusction** ssy, nd indequte number of virl copies(<138 copies/mL).  Mcdaniel result must be combined with clinicl observtions, ptient history, nd epidemiologicl informtion. The expected result is Mcdaniel.  Fct Sheet for Ptients:  EntrepreneurPulse.com.u  Fct Sheet for [employer] Providers:  IncredibleEmployment.be  This test is no t yet pproved or clered by the [place] FD nd  hs been uthorized for detection nd/or dignosis of SRS-CoV-2 by FD under n Emergency Use uthoriztion (EU). This EU will remin  in effect (mening this test cn be used) for the durtion of the COVID-19 declrtion under Section 564(b)(1) of the ct, 21 U.S.C.section 360bbb-3(b)(1), unless the uthoriztion is terminted  or revoked sooner.       Influenz  by PCR Mcdaniel Mcdaniel Finl   Influenz B by PCR Mcdaniel Mcdaniel Finl    Comment: (NOTE) The Xpert Xpress SRS-CoV-2/FLU/RSV plus ssy is intended s n id in the dignosis of influenz from Nsophryngel swb specimens nd should not be used s  sole bsis for tretment. Nsl wshings nd spirtes re uncceptble for Xpert Xpress SRS-CoV-2/FLU/RSV testing.  Fct Sheet for Ptients: EntrepreneurPulse.com.u  Fct Sheet for [employer]  Providers: IncredibleEmployment.be  This test is not yet pproved or clered by the [place] FD nd hs been uthorized for detection nd/or dignosis of SRS-CoV-2 by FD under n Emergency Use uthoriztion (EU). This EU will remin in effect (mening this test cn be used) for the durtion of the COVID-19 declrtion under Section 564(b)(1) of the ct, 21 U.S.C. section 360bbb-3(b)(1), unless the uthoriztion is terminted or revoked.  Performed t [facility], [address], [place], [place] [address]   Blood Culture (routine x 2)     Sttus: bnorml   Collection Time: 07/18/20 10:47 M   Specimen: BLOOD  Result Vlue Ref Rnge Sttus   Specimen Description   Finl    BLOOD LEFT NTECUBITL Performed t [facility], [address] [place], [place] [address]    Specil Requests   Finl    BOTTLES DRWN EROBIC ND NEROBIC Blood Culture results my not be optiml due to n excessive volume of blood received in culture bottles Performed t [facility], [address] [place], [place] [address]    Culture  Setup Time   Finl    GRM POSITIVE COCCI EROBIC BOTTLE ONLY Orgnism ID to follow CRITICL RESULT CLLED TO, RED BCK BY ND VERIFIED WITH: Wndlee Ferdinnd RN [phone] 07/19/20 HNM Performed t [facility], [address] [place], [place] [address]    Culture ()  Finl    STPHYLOCOCCUS HOMINIS THE SIGNIFICNCE OF ISOLTING THIS ORGNISM FROM  SINGLE SET OF BLOOD CULTURES WHEN MULTIPLE SETS RE DRWN IS UNCERTIN. PLESE NOTIFY THE MICROBIOLOGY DEPRTMENT WITHIN ONE WEEK IF SPECITION ND SENSITIVITIES RE REQUIRED. Performed t [facility], [address], [place], [place] [address]    Report Sttus 07/21/2020 FINL  Finl  Blood Culture ID Pnel (Reflexed)     Sttus: bnorml   Collection Time: 07/18/20 10:47 M  Result Vlue Ref Rnge Sttus   Enterococcus feclis NOT DETECTED NOT  DETECTED Finl   Enterococcus Fecium NOT DETECTED NOT DETECTED Finl   Listeri monocytogenes NOT DETECTED NOT DETECTED Finl   Stphylococcus species DETECTED () NOT DETECTED Finl    Comment: CRITICL RESULT CLLED TO, RED BCK BY ND VERIFIED WITH: Wndlee Ferdinnd RN [phone] 07/19/20 HNM    Stphylococcus ureus (BCID) NOT DETECTED NOT DETECTED Finl   Stphylococcus epidermidis NOT DETECTED NOT DETECTED Finl  Staphylococcus lugdunensis NOT DETECTED NOT DETECTED Final   Streptococcus species NOT DETECTED NOT DETECTED Final   Streptococcus agalactiae NOT DETECTED NOT DETECTED Final   Streptococcus pneumoniae NOT DETECTED NOT DETECTED Final   Streptococcus pyogenes NOT DETECTED NOT DETECTED Final   Ahyana Skillin.calcoaceticus-baumannii NOT DETECTED NOT DETECTED Final   Bacteroides fragilis NOT DETECTED NOT DETECTED Final   Enterobacterales NOT DETECTED NOT DETECTED Final   Enterobacter cloacae complex NOT DETECTED NOT DETECTED Final   Escherichia coli NOT DETECTED NOT DETECTED Final   Klebsiella aerogenes NOT DETECTED NOT DETECTED Final   Klebsiella oxytoca NOT DETECTED NOT DETECTED Final   Klebsiella pneumoniae NOT DETECTED NOT DETECTED Final   Proteus species NOT DETECTED NOT DETECTED Final   Salmonella species NOT DETECTED NOT DETECTED Final   Serratia marcescens NOT DETECTED NOT DETECTED Final   Haemophilus influenzae NOT DETECTED NOT DETECTED Final   Neisseria meningitidis NOT DETECTED NOT DETECTED Final   Pseudomonas aeruginosa NOT DETECTED NOT DETECTED Final   Stenotrophomonas maltophilia NOT DETECTED NOT DETECTED Final   Candida albicans NOT DETECTED NOT DETECTED Final   Candida auris NOT DETECTED NOT DETECTED Final   Candida glabrata NOT DETECTED NOT DETECTED Final   Candida krusei NOT DETECTED NOT DETECTED Final   Candida parapsilosis NOT DETECTED NOT DETECTED Final   Candida tropicalis NOT DETECTED NOT DETECTED Final   Cryptococcus neoformans/gattii NOT DETECTED NOT DETECTED Final     Comment: Performed at [facility], [address] [place], [place] [address]  Urine culture     Status: Abnormal   Collection Time: 07/18/20  1:51 PM   Specimen: In/Out Cath Urine  Result Value Ref Range Status   Specimen Description   Final    IN/OUT CATH URINE Performed at [facility], [address] [place], [place] [address]    Special Requests   Final    NONE Performed at [facility], [place], [place] [address]    Culture 50,000 COLONIES/mL ENTEROCOCCUS FAECALIS (Imelda Dandridge)  Final   Report Status 07/21/2020 FINAL  Final   Organism ID, Bacteria ENTEROCOCCUS FAECALIS (Hilmar [place])  Final      Susceptibility   Enterococcus faecalis - MIC*    AMPICILLIN <=2 SENSITIVE Sensitive     NITROFURANTOIN <=16 SENSITIVE Sensitive     VANCOMYCIN 1 SENSITIVE Sensitive     * 50,000 COLONIES/mL ENTEROCOCCUS FAECALIS  MRSA PCR Screening     Status: None   Collection Time: 07/19/20  8:26 AM   Specimen: Nasopharyngeal  Result Value Ref Range Status   MRSA by PCR NEGATIVE NEGATIVE Final    Comment:        The GeneXpert MRSA Assay (FDA approved for NASAL specimens only), is one component of Swati Granberry comprehensive MRSA colonization surveillance program. It is not intended to diagnose MRSA infection nor to guide or monitor treatment for MRSA infections. Performed at [facility], [address] [place], [place] [address]   Respiratory Panel by PCR     Status: None   Collection Time: 07/19/20  1:30 PM   Specimen: Nasopharyngeal Swab; Respiratory  Result Value Ref Range Status   Adenovirus NOT DETECTED NOT DETECTED Final   Coronavirus 229E NOT DETECTED NOT DETECTED Final    Comment: (NOTE) The Coronavirus on the Respiratory Panel, DOES NOT test for the novel  Coronavirus (2019 nCoV)    Coronavirus HKU1 NOT DETECTED NOT DETECTED Final   Coronavirus NL63 NOT DETECTED NOT DETECTED Final   Coronavirus OC43 NOT DETECTED NOT  DETECTED Final    Metapneumovirus NOT DETECTED NOT DETECTED Final   Rhinovirus / Enterovirus NOT DETECTED NOT DETECTED Final   Influenza [place] NOT DETECTED NOT DETECTED Final   Influenza B NOT DETECTED NOT DETECTED Final   Parainfluenza Virus 1 NOT DETECTED NOT DETECTED Final   Parainfluenza Virus 2 NOT DETECTED NOT DETECTED Final   Parainfluenza Virus 3 NOT DETECTED NOT DETECTED Final   Parainfluenza Virus 4 NOT DETECTED NOT DETECTED Final   Respiratory Syncytial Virus NOT DETECTED NOT DETECTED Final   Bordetella pertussis NOT DETECTED NOT DETECTED Final   Bordetella Parapertussis NOT DETECTED NOT DETECTED Final   Chlamydophila pneumoniae NOT DETECTED NOT DETECTED Final   Mycoplasma pneumoniae NOT DETECTED NOT DETECTED Final    Comment: Performed at [facility], [place] [address] [place], [place] [address]     Labs: Basic Metabolic Panel: Recent Labs  Lab 07/20/20 0509 07/21/20 0534 07/22/20 0538 07/23/20 0413 07/24/20 0626  NA 142 142 143 139 141  Frank 3.1* 2.9* 3.5 3.3* 3.7  CL 108 105 109 103 108  CO2 _0 GLUCOSE 105* 129* 126* 114* 112*  BUN 24* 29* _1 CREATININE 1.20 1.38* 1.24 1.12 1.16  CALCIUM 8.3* 8.6* 8.0* 8.4* 8.4*  MG 2.3 2.4 2.2 2.2 2.2  PHOS 2.6 2.0* 3.3 2.9 2.2*   Liver Function Tests: Recent Labs  Lab 07/20/20 0509 07/21/20 0534 07/22/20 0538 07/23/20 0413 07/24/20 0626  AST 34 36 44* 36 24  ALT _2 ALKPHOS 47 47 47 49 50  BILITOT 2.7* 1.7* 1.2 2.7* 1.2  PROT 6.3* 6.2* 5.6* 6.1* 5.7*  ALBUMIN 3.2* 3.2* 2.9* 3.1* 2.9*   No results for input(s): LIPASE, AMYLASE in the last 168 hours. No results for input(s): AMMONIA in the last 168 hours. CBC: Recent Labs  Lab 07/20/20 0509 07/21/20 0534 07/22/20 0538 07/22/20 1249 07/23/20 0413 07/24/20 0626  WBC 0.8* 0.7* 0.6*  --  0.9* 0.7*  NEUTROABS 0.7* 0.6* 0.6*  --  0.8* 0.5*  HGB 8.4* 8.3* 7.1* 7.1* 8.3* 8.5*  HCT 24.5* 24.5* 20.8* 22.1* 25.0* 25.7*  MCV 96.5 98.4 99.5  --  98.4  100.0  PLT 78* 75* 65*  --  74* 75*   Cardiac Enzymes: No results for input(s): CKTOTAL, CKMB, CKMBINDEX, TROPONINI in the last 168 hours. BNP: BNP (last 3 results) Recent Labs    11/08/19 1643 07/18/20 1010  BNP 99.0 158.5*    ProBNP (last 3 results) No results for input(s): PROBNP in the last 8760 hours.  CBG: No results for input(s): GLUCAP in the last 168 hours.     Signed:  Fayrene Helper Mcdaniel.  [facility] 07/24/2020, 6:53 PM

## 2020-07-24 NOTE — Care Management Important Message (Signed)
Important Message  Patient Details  Name: Frank Mcdaniel MRN: [id number] Date of Birth: [birth year]   Medicare Important Message Given:  Yes     Juliann Pulse A Naureen Benton 07/24/2020, 11:08 AM

## 2020-07-24 NOTE — Progress Notes (Signed)
Pt discharge home with son.  Discharge education reviewed with pt and son.  VSS, no s/s of distress.

## 2020-07-25 ENCOUNTER — Inpatient Hospital Stay: Payer: PPO | Admitting: Oncology

## 2020-07-25 ENCOUNTER — Inpatient Hospital Stay: Payer: PPO

## 2020-07-25 ENCOUNTER — Telehealth: Payer: Self-pay | Admitting: Family Medicine

## 2020-07-25 ENCOUNTER — Telehealth: Payer: Self-pay

## 2020-07-25 DIAGNOSIS — K2289 Other specified disease of esophagus: Secondary | ICD-10-CM

## 2020-07-25 NOTE — Telephone Encounter (Signed)
This patient is insisting on a in office visit after his discharge from hospital for pneumonia, he is scheduled for Jan. 3 2022. Please advise.  Frank Mcdaniel,cma

## 2020-07-25 NOTE — Telephone Encounter (Signed)
Transition Care Management Follow-up Telephone Call  Date of discharge and from where: 07/24/20 from [facility]  How have you been since you were released from the hospital? He still has a sore throat and has lost his voice. Denies fever, dizziness, shortness of breath, chest pain, headache, abd pain. Productive cough, getting better with medications. A little weak but active per his baseline. Memory has worsened since hospitalization.   Any questions or concerns? Yes, patient requests PCP place referral for Gastroenterology and ENT.   Items Reviewed:  Did the pt receive and understand the discharge instructions provided? Increase activity slowly.  Medications obtained and verified? Yes   Other? No   Any new allergies since your discharge? No   Dietary orders reviewed? Dysphagia 3, thin liquid diet- meds with puree recommended.   Do you have support at home? Yes   Home Care and Equipment/Supplies: Were home health services ordered? Yes If so, what is the name of the agency? [facility] 07/26/20.  Son plans to contact [employer] [employer] [facility] as a IT trainer.   Were any new equipment or medical supplies ordered?  No  Functional Questionnaire: (I = Independent and D = Dependent) ADLs: i  Bathing/Dressing- i  Meal Prep- son assists  Eating- i  Maintaining continence- i  Transferring/Ambulation- i  Managing Meds- son assists  Follow up appointments reviewed:   PCP Hospital f/u appt confirmed? Yes  Scheduled to see Dr. Caryl Bis on 08/09/20 @ 3:15.  [facility] f/u appt confirmed? Yes  Plans to see Gastroenterology and ENT for follow up after PCP places referral.   Are transportation arrangements needed? No  If their condition worsens, is the pt aware to call PCP or go to the Emergency Dept.? Yes  Was the patient provided with contact information for the PCP's office or ED? Yes  Was to pt encouraged to call back with questions or concerns? Yes

## 2020-07-25 NOTE — Telephone Encounter (Signed)
I called the patient's son and he rescheduled to 1/5 because of the covid test but he wanted a late afternoon appointment. I schedule him for a 3:15 slot.   Shaniece Bussa,cma

## 2020-07-25 NOTE — Telephone Encounter (Signed)
Pt son called to schedule a HFU.  Pt was discharged yesterday. He is scheduled for 08/07/20  Pt son only wants an in office appt

## 2020-07-25 NOTE — Telephone Encounter (Signed)
Patient had a Covid test during his recent hospitalization.  The Covid test was on 07/18/2020.  Please make sure that he is 21 days after that test before he can come into the office.  It looks like his appointment would need to be after 08/09/2019.

## 2020-07-26 ENCOUNTER — Encounter: Payer: Self-pay | Admitting: Family Medicine

## 2020-07-28 ENCOUNTER — Ambulatory Visit
Admission: EM | Admit: 2020-07-28 | Discharge: 2020-07-28 | Disposition: A | Discharge status: Home / Self Care | Payer: PPO

## 2020-07-28 MED ORDER — NYSTATIN 100000 UNIT/ML MT SUSP: 5.0000 mL | Freq: Four times a day (QID) | OROMUCOSAL | 0 refills | Status: DC

## 2020-07-28 NOTE — ED Notes (Signed)
Spoke with provider who informed me that patient required higher level of care. Has been in and out of emergency room for the last 2wks

## 2020-07-29 NOTE — Addendum Note (Signed)
Addended by: [name] on: 07/29/2020 09:50 AM   Modules accepted: Orders

## 2020-07-29 NOTE — Telephone Encounter (Signed)
GI referral placed. Why does he need the ENT referral?

## 2020-07-30 NOTE — Progress Notes (Signed)
[facility]  Telephone:([phone] [phone]  ID: Frank Mcdaniel OB: 10-01-1932  MR#: [id number]  OYD#:[id number]  Patient Care Team: [name], MD as PCP - General (Family Medicine) Rockey Situ Kathlene November, MD as PCP - Cardiology (Cardiology) [name], MD as Consulting Physician (Cardiology) [name], MD as Consulting Physician (Oncology) [name] as Physician Assistant (Urology)  CHIEF COMPLAINT: Recurrent hairy cell leukemia.  INTERVAL HISTORY: Patient returns to clinic today for hospital follow-up and reinitiation of weekly Rituxan.  His baseline confusion has become worse since his admission and much of the questions are answered by his son.  He continues to have chronic weakness and fatigue.  He has a residual cough and hoarseness of voice. He has no neurologic complaints.  He denies any fevers.  He denies any chest pain, shortness of breath, or hemoptysis.  He denies nausea, vomiting, diarrhea or constipation.  He has no urinary complaints.  Patient offers no further specific complaints today.  REVIEW OF SYSTEMS:   Review of Systems  Constitutional: Positive for malaise/fatigue. Negative for fever and weight loss.  HENT: Positive for sore throat.   Respiratory: Positive for cough. Negative for hemoptysis and shortness of breath.   Cardiovascular: Negative.  Negative for chest pain and leg swelling.  Gastrointestinal: Negative.  Negative for abdominal pain.  Genitourinary: Negative.  Negative for dysuria.  Musculoskeletal: Negative.  Negative for back pain.  Skin: Negative.  Negative for rash.  Neurological: Positive for weakness. Negative for dizziness, focal weakness and headaches.  Psychiatric/Behavioral: Positive for memory loss. The patient is not nervous/anxious.     As per HPI. Otherwise, a complete review of systems is negative.  PAST MEDICAL HISTORY: Past Medical History:  Diagnosis Date  .  Anemia 09/01/2015  . Anxiety 09/11/2015  . Arthritis   . Asthma    as a teenager  . Asymptomatic Sinus Bradycardia   . Atypical chest pain    a. 08/2014  . BPH (benign prostatic hyperplasia)   . CKD (chronic kidney disease), stage III ([place])   . Closed fracture of tuft of distal phalanx of finger 08/31/2018  . Coronary artery disease    a. 2000 s/p CABG;  b. 10/2008 Neg MV, EF 64%; c. cath 05/2015: LM 85% sev cal, pLAD-1 lesion 80%, pLAD-2 lesion 100% chronic, ostLCx 70% sev cal, OM4 90%, ostRCA 70%, mid RCA 90%, LIMA-LAD patent, VG-OM2 patent, VG-OM3 patent, VG-RPDA patent. No AS  . Dementia without behavioral disturbance ([place]) 05/28/2019  . GERD (gastroesophageal reflux disease)   . H/O echocardiogram    a. 10/2005 Echo: nl EF.  [name] Hairy cell leukemia ([place])   . HOH (hard of hearing)    Bilateral hearing aids  . HTN (hypertension)   . Hyperlipidemia   . Hypertension   . Leukemia ([place])   . Neutropenic fever ([place]) 11/08/2019  . Stricture and stenosis of esophagus     PAST SURGICAL HISTORY: Past Surgical History:  Procedure Laterality Date  . CARDIAC CATHETERIZATION N/A 05/26/2015   Procedure: Left Heart Cath;  Surgeon: [name], MD;  Location: [place];  Service: Cardiovascular;  Laterality: N/A;  . CARDIAC CATHETERIZATION N/A 05/26/2015   Procedure: Coronary/Graft Angiography;  Surgeon: [name], MD;  Location: [place] [facility];  Service: Cardiovascular;  Laterality: N/A;  . CATARACT EXTRACTION    . CORONARY ARTERY BYPASS GRAFT  2000  . ESOPHAGOGASTRODUODENOSCOPY N/A 02/18/2017   Procedure: ESOPHAGOGASTRODUODENOSCOPY (EGD) with removal of  food bolus;  Surgeon: [name], MD;  Location: [facility] ENDOSCOPY;  Service: Endoscopy;  Laterality: N/A;  . ESOPHAGOGASTRODUODENOSCOPY (EGD) WITH PROPOFOL N/A 03/11/2017   Procedure: ESOPHAGOGASTRODUODENOSCOPY (EGD) WITH PROPOFOL;  Surgeon: [name], MD;  Location: [facility] ENDOSCOPY;  Service: Endoscopy;  Laterality: N/A;   . ESOPHAGOGASTRODUODENOSCOPY (EGD) WITH PROPOFOL N/A 04/15/2017   Procedure: ESOPHAGOGASTRODUODENOSCOPY (EGD) WITH PROPOFOL;  Surgeon: [name], MD;  Location: [facility] ENDOSCOPY;  Service: Endoscopy;  Laterality: N/A;  . EYE SURGERY Left    Cataract Extraction with IOL  . INGUINAL HERNIA REPAIR Left 06/24/2016   Procedure: HERNIA REPAIR INGUINAL ADULT;  Surgeon: [name], MD;  Location: [facility] ORS;  Service: General;  Laterality: Left;  . TONSILLECTOMY  1942   . UPPER GASTROINTESTINAL ENDOSCOPY  3 years ago    with Dilation.     FAMILY HISTORY Family History  Problem Relation Age of Onset  . Hypertension Mother   . Arthritis Father   . Other Other        no premature CAD.  [name] Colon cancer Neg Hx   . Prostate cancer Neg Hx   . Stomach cancer Neg Hx        ADVANCED DIRECTIVES:    HEALTH MAINTENANCE: Social History   Tobacco Use  . Smoking status: Never Smoker  . Smokeless tobacco: Never Used  Vaping Use  . Vaping Use: Never used  Substance Use Topics  . Alcohol use: No  . Drug use: No     No Known Allergies  Current Outpatient Medications  Medication Sig Dispense Refill  . atorvastatin (LIPITOR) 40 MG tablet TAKE 1/2 TABLETS (20 MG TOTAL) BY MOUTH DAILY. (Patient taking differently: Take 20 mg by mouth daily.) 45 tablet 3  . Calcium Carbonate Antacid (TUMS CHEWY BITES PO) Take 1 tablet by mouth as needed.     [name] CLINPRO 5000 1.1 % PSTE 1 application as directed. USE IN PLACE OF REGULAR TOOTHPASTE. USE PEASIZED AMOUNT, BRUSH DAILY AT LEAST 2 MINUTES. SPIT OUT.    [name] donepezil (ARICEPT) 10 MG tablet Take 1 tablet (10 mg total) by mouth at bedtime. 90 tablet 1  . finasteride (PROSCAR) 5 MG tablet TAKE 1 TABLET BY MOUTH EVERY DAY (Patient taking differently: Take 5 mg by mouth daily.) 30 tablet 1  . isosorbide mononitrate (IMDUR) 30 MG 24 hr tablet TAKE 1 TABLET BY MOUTH EVERY DAY (Patient taking differently: Take 30 mg by mouth daily.) 90 tablet 3  . Multiple  Vitamin (MULTIVITAMIN) tablet Take 1 tablet by mouth daily.    [name] nystatin (MYCOSTATIN) 100000 UNIT/ML suspension Take 5 mLs (500,000 Units total) by mouth 4 (four) times daily. swish in the mouth and retain for as long as possible (several minutes) before swallowing 140 mL 0  . pantoprazole (PROTONIX) 40 MG tablet Take 1 tablet (40 mg total) by mouth daily. 30 tablet 1  . tamsulosin (FLOMAX) 0.4 MG CAPS capsule TAKE 2 CAPSULES BY MOUTH EVERY DAY (Patient taking differently: Take 0.8 mg by mouth daily. TAKE 2 CAPSULES BY MOUTH EVERY DAY) 180 capsule 1  . ondansetron (ZOFRAN) 8 MG tablet Take 1 tablet (8 mg total) by mouth 2 (two) times daily as needed (Nausea or vomiting). (Patient not taking: Reported on 08/01/2020) 30 tablet 1  . prochlorperazine (COMPAZINE) 10 MG tablet Take 1 tablet (10 mg total) by mouth every 6 (six) hours as needed (Nausea or vomiting). (Patient not taking: Reported on 08/01/2020) 60 tablet 2   No current facility-administered medications for this  visit.   Facility-Administered Medications Ordered in Other Visits  Medication Dose Route Frequency Provider Last Rate Last Admin  . dexamethasone (DECADRON) 10 mg in sodium chloride 0.9 % 50 mL IVPB  10 mg Intravenous Once [name], MD      . famotidine (PEPCID) IVPB 20 mg premix  20 mg Intravenous Once [name], MD 200 mL/hr at 08/01/20 0945 20 mg at 08/01/20 0945  . riTUXimab-pvvr (RUXIENCE) 700 mg in sodium chloride 0.9 % 180 mL infusion  375 mg/m2 (Treatment Plan Recorded) Intravenous Once [name], MD        OBJECTIVE: Vitals:   08/01/20 0838  BP: (!) 127/54  Pulse: (!) 53  Resp: 16  Temp: 97.9 F (36.6 C)  SpO2: 100%     Body mass index is 26.2 kg/m.    ECOG FS:2 - Symptomatic, <50% confined to bed  General: Well-developed, well-nourished, no acute distress. Eyes: Pink conjunctiva, anicteric sclera. HEENT: Normocephalic, moist mucous membranes. Lungs: No audible wheezing or  coughing. Heart: Regular rate and rhythm. Abdomen: Soft, nontender, no obvious distention. Musculoskeletal: No edema, cyanosis, or clubbing. Neuro: Alert, confused. Cranial nerves grossly intact. Skin: No rashes or petechiae noted. Psych: Normal affect.  LAB RESULTS:  Lab Results  Component Value Date   NA 138 08/01/2020   K 3.6 08/01/2020   CL 105 08/01/2020   CO2 24 08/01/2020   GLUCOSE 124 (H) 08/01/2020   BUN 15 08/01/2020   CREATININE 1.22 08/01/2020   CALCIUM 8.7 (L) 08/01/2020   PROT 5.7 (L) 07/24/2020   ALBUMIN 2.9 (L) 07/24/2020   AST 24 07/24/2020   ALT 22 07/24/2020   ALKPHOS 50 07/24/2020   BILITOT 1.2 07/24/2020   GFRNONAA 58 (L) 08/01/2020   GFRAA 50 (L) 02/08/2020    Lab Results  Component Value Date   WBC 1.7 (L) 08/01/2020   NEUTROABS 1.5 (L) 08/01/2020   HGB 8.9 (L) 08/01/2020   HCT 27.2 (L) 08/01/2020   MCV 100.4 (H) 08/01/2020   PLT 136 (L) 08/01/2020     STUDIES: DG Chest 2 View  Result Date: 07/17/2020 CLINICAL DATA:  Cough, fever, shortness of breath. EXAM: CHEST - 2 VIEW COMPARISON:  Radiograph and CT 11/08/2019 FINDINGS: Post median sternotomy and CABG.The cardiomediastinal contours are normal. There is bronchial thickening. Pulmonary vasculature is normal. No consolidation, pleural effusion, or pneumothorax. Exaggerated thoracic kyphosis with multilevel degenerative change in the thoracic spine. No acute osseous abnormalities are seen. Please note the anterior aspect of the sternum is not included in the field of view on the lateral. IMPRESSION: 1. Bronchial thickening without pneumonia. 2. Post CABG. Electronically Signed   By: Keith Rake M.D.   On: 07/17/2020 17:03   CT CHEST WO CONTRAST  Result Date: 07/20/2020 CLINICAL DATA:  Chest pain, shortness of breath. EXAM: CT CHEST WITHOUT CONTRAST TECHNIQUE: Multidetector CT imaging of the chest was performed following the standard protocol without IV contrast. COMPARISON:  CT chest,  abdomen, pelvis 10/08/2019. CT chest 07/22/2014 FINDINGS: Cardiovascular: Normal heart size. No significant pericardial effusion. The thoracic aorta is normal in caliber. At least mild atherosclerotic plaque of the thoracic aorta. Four vessel coronary artery calcifications status post coronary artery bypass. Mediastinum/Nodes: No enlarged mediastinal or axillary lymph nodes. Thyroid gland in trachea demonstrate no significant findings. The esophageal wall appears slightly thickened distally. Lungs/Pleura: Markedly limited evaluation due to respiratory motion artifact. Interval development of suggestion of multifocal peribronchovascular ground-glass airspace opacities that are most prominent within the right  upper lobe (24, 3:35, 56, 70). Pulmonary micronodule along the left major fissure (3:24) likely represents an intrapulmonary lymph node. Couple of dense pulmonary micronodules within right upper lobe (3:35). No pulmonary mass. No pleural effusion. No pneumothorax. Upper Abdomen: Redemonstration of a at least 2 cm calcified stone within the gallbladder lumen. 1 cm fluid density lesion within the right hepatic lobe (2:103) that appears similar compared to prior CT abdomen pelvis. Otherwise no acute abnormality. Musculoskeletal: No chest wall abnormality. No suspicious lytic or blastic osseous lesions. No acute displaced fracture. Multilevel degenerative changes of the spine. IMPRESSION: 1. Markedly limited evaluation of the lung parenchyma due to respiratory motion artifact with suggestion of multifocal peribronchovascular ground-glass airspace opacities that could represent infection/inflammation. Recommend repeat CT in 3-6 months to evaluate for resolution as an underlying adenocarcinoma cannot be excluded. 2. Persistent distal esophageal wall thickening. Correlate with esophagitis. An underlying lesion cannot be excluded. 3. Cholelithiasis. Electronically Signed   By: Morgane  Naveau M.D.   On: 07/20/2020 16:42    DG Chest Port 1 View  Result Date: 07/23/2020 CLINICAL DATA:  Neutropenia.  History of asthma. EXAM: PORTABLE CHEST 1 VIEW COMPARISON:  July 19, 2020 FINDINGS: New bibasilar infiltrates. The heart, hila, mediastinum, lungs, and pleura are otherwise unremarkable. IMPRESSION: New bibasilar infiltrates worrisome for pneumonia. Electronically Signed   By: David  Williams III M.D   On: 07/23/2020 14:51   DG Chest Port 1 View  Result Date: 07/19/2020 CLINICAL DATA:  Increased shortness of breath EXAM: PORTABLE CHEST 1 VIEW COMPARISON:  07/18/2020 and prior FINDINGS: Mild hypoinflation. No pneumothorax or pleural effusion. Minimal bibasilar opacities. Diffuse interstitial prominence. Postsurgical appearance of the cardiomediastinal silhouette is unchanged. No acute osseous abnormality. IMPRESSION: Diffuse interstitial prominence and minimal bibasilar opacities, edema versus infection. Electronically Signed   By: Chikanele  Emekauwa M.D.   On: 07/19/2020 07:53   DG Chest Port 1 View  Result Date: 07/18/2020 CLINICAL DATA:  Question sepsis EXAM: PORTABLE CHEST 1 VIEW COMPARISON:  07/17/2020 FINDINGS: Postop CABG.  Heart size normal.  Negative for heart failure. Mild bibasilar atelectasis has developed since yesterday. No effusion. IMPRESSION: Interval development of mild bibasilar atelectasis/infiltrate. Electronically Signed   By: Charles  Clark M.D.   On: 07/18/2020 10:38      ONCOLOGY HISTORY:  Patient initially received cladribine in 2006 and then was treated for a relapse in 2011 and again in 2014. Bone marrow biopsy at that time confirmed recurrence of disease with 80-90% hairy cell leukemia noted in his specimen. Patient completed cladribine 0.15 mg/kg on days 1 through 5 along with 8 weekly cycles of Rituxan on April 18, 2015. Bone marrow biopsy results after completion of 8 weeks of Rituxan without evidence of residual disease.  Patient typically has no evidence of disease and peripheral  blood.   ASSESSMENT: Recurrent hairy cell leukemia.  PLAN:   1.  Recurrent hairy cell leukemia: See oncology history as above.  Bone marrow biopsy on November 02, 2019 revealed recurrent hairy cell leukemia, but percentage of involvement was not reported.  Patient received his cladribine from July 03, 2020 through July 07, 2020.  He received cycle 2 of weekly Rituxan on July 10, 2020.  Treatment was delayed secondary to hospital admission.  Proceed with cycle 3 of 8 of weekly Rituxan today.  Return to clinic in 1 week for further evaluation and consideration of cycle 4. 2. Thrombocytopenia: Platelets have improved to 136.  Monitor. 3.  Leukopenia: Improving.  Patient's total white blood count is   1.7 with an ANC of 1.5.  Proceed with Rituxan as above. 4.  Anemia: Hemoglobin 8.9.  He does not require transfusion. 5.  Cardiac disease: Continue follow-up and treatment per Dr. Gollan. 6.  Renal insufficiency: Essentially resolved with a creatinine of 1.22. 7.  Pneumonia: Patient has now completed antibiotics. 8.  Sore throat/cough: Patient has been instructed to continue nystatin and Protonix as ordered and follow-up with his primary care physician next week as scheduled.  Patient expressed understanding and was in agreement with this plan. He also understands that He can call clinic at any time with any questions, concerns, or complaints.     J , MD 08/01/20 9:54 AM    

## 2020-07-31 NOTE — Telephone Encounter (Signed)
Patient's son states ENT was suggested by hospitalist for his continued sore throat. Notes he has an appointment scheduled with Oncology, Dr. [name] 12/28, and will ask for strep test or referral to ENT if needed. Encouraged him to call this office back if symptoms continue to worsen and as needed.

## 2020-08-01 ENCOUNTER — Inpatient Hospital Stay: Payer: PPO | Admitting: Oncology

## 2020-08-01 ENCOUNTER — Inpatient Hospital Stay: Payer: PPO

## 2020-08-01 ENCOUNTER — Encounter: Payer: Self-pay | Admitting: Oncology

## 2020-08-01 VITALS — BP 110/56 | HR 68 | Temp 96.0°F | Resp 17

## 2020-08-01 VITALS — BP 127/54 | HR 53 | Temp 97.9°F | Resp 16 | Wt 157.4 lb

## 2020-08-01 DIAGNOSIS — R5382 Chronic fatigue, unspecified: Secondary | ICD-10-CM | POA: Diagnosis not present

## 2020-08-01 DIAGNOSIS — R5081 Fever presenting with conditions classified elsewhere: Secondary | ICD-10-CM | POA: Diagnosis not present

## 2020-08-01 DIAGNOSIS — I959 Hypotension, unspecified: Secondary | ICD-10-CM | POA: Diagnosis not present

## 2020-08-01 DIAGNOSIS — R531 Weakness: Secondary | ICD-10-CM | POA: Diagnosis not present

## 2020-08-01 DIAGNOSIS — C9142 Hairy cell leukemia, in relapse: Secondary | ICD-10-CM

## 2020-08-01 DIAGNOSIS — N183 Chronic kidney disease, stage 3 unspecified: Secondary | ICD-10-CM | POA: Diagnosis not present

## 2020-08-01 DIAGNOSIS — E785 Hyperlipidemia, unspecified: Secondary | ICD-10-CM | POA: Diagnosis not present

## 2020-08-01 DIAGNOSIS — D6959 Other secondary thrombocytopenia: Secondary | ICD-10-CM | POA: Diagnosis not present

## 2020-08-01 DIAGNOSIS — R5381 Other malaise: Secondary | ICD-10-CM | POA: Diagnosis not present

## 2020-08-01 DIAGNOSIS — I129 Hypertensive chronic kidney disease with stage 1 through stage 4 chronic kidney disease, or unspecified chronic kidney disease: Secondary | ICD-10-CM | POA: Diagnosis not present

## 2020-08-01 DIAGNOSIS — Z5111 Encounter for antineoplastic chemotherapy: Secondary | ICD-10-CM | POA: Diagnosis not present

## 2020-08-01 DIAGNOSIS — F0390 Unspecified dementia without behavioral disturbance: Secondary | ICD-10-CM | POA: Diagnosis not present

## 2020-08-01 DIAGNOSIS — Z79899 Other long term (current) drug therapy: Secondary | ICD-10-CM | POA: Diagnosis not present

## 2020-08-01 DIAGNOSIS — D649 Anemia, unspecified: Secondary | ICD-10-CM | POA: Diagnosis not present

## 2020-08-01 DIAGNOSIS — K219 Gastro-esophageal reflux disease without esophagitis: Secondary | ICD-10-CM | POA: Diagnosis not present

## 2020-08-01 DIAGNOSIS — Z5112 Encounter for antineoplastic immunotherapy: Secondary | ICD-10-CM | POA: Diagnosis not present

## 2020-08-01 DIAGNOSIS — Z7982 Long term (current) use of aspirin: Secondary | ICD-10-CM | POA: Diagnosis not present

## 2020-08-01 DIAGNOSIS — N289 Disorder of kidney and ureter, unspecified: Secondary | ICD-10-CM | POA: Diagnosis not present

## 2020-08-01 DIAGNOSIS — R0989 Other specified symptoms and signs involving the circulatory and respiratory systems: Secondary | ICD-10-CM | POA: Diagnosis not present

## 2020-08-01 DIAGNOSIS — J45909 Unspecified asthma, uncomplicated: Secondary | ICD-10-CM | POA: Diagnosis not present

## 2020-08-01 DIAGNOSIS — I2510 Atherosclerotic heart disease of native coronary artery without angina pectoris: Secondary | ICD-10-CM | POA: Diagnosis not present

## 2020-08-01 DIAGNOSIS — C9140 Hairy cell leukemia not having achieved remission: Secondary | ICD-10-CM | POA: Diagnosis not present

## 2020-08-01 DIAGNOSIS — M1990 Unspecified osteoarthritis, unspecified site: Secondary | ICD-10-CM | POA: Diagnosis not present

## 2020-08-01 LAB — CBC WITH DIFFERENTIAL/PLATELET
Abs Immature Granulocytes: 0 10*3/uL (ref 0.00–0.07)
Basophils Absolute: 0 10*3/uL (ref 0.0–0.1)
Basophils Relative: 1 %
Eosinophils Absolute: 0.1 10*3/uL (ref 0.0–0.5)
Eosinophils Relative: 3 %
HCT: 27.2 % — ABNORMAL LOW (ref 39.0–52.0)
Hemoglobin: 8.9 g/dL — ABNORMAL LOW (ref 13.0–17.0)
Immature Granulocytes: 0 %
Lymphocytes Relative: 6 %
Lymphs Abs: 0.1 10*3/uL — ABNORMAL LOW (ref 0.7–4.0)
MCH: 32.8 pg (ref 26.0–34.0)
MCHC: 32.7 g/dL (ref 30.0–36.0)
MCV: 100.4 fL — ABNORMAL HIGH (ref 80.0–100.0)
Monocytes Absolute: 0.1 10*3/uL (ref 0.1–1.0)
Monocytes Relative: 4 %
Neutro Abs: 1.5 10*3/uL — ABNORMAL LOW (ref 1.7–7.7)
Neutrophils Relative %: 86 %
Platelets: 136 10*3/uL — ABNORMAL LOW (ref 150–400)
RBC: 2.71 MIL/uL — ABNORMAL LOW (ref 4.22–5.81)
RDW: 16.3 % — ABNORMAL HIGH (ref 11.5–15.5)
WBC: 1.7 10*3/uL — ABNORMAL LOW (ref 4.0–10.5)
nRBC: 0 % (ref 0.0–0.2)

## 2020-08-01 LAB — BASIC METABOLIC PANEL
Anion gap: 9 (ref 5–15)
BUN: 15 mg/dL (ref 8–23)
CO2: 24 mmol/L (ref 22–32)
Calcium: 8.7 mg/dL — ABNORMAL LOW (ref 8.9–10.3)
Chloride: 105 mmol/L (ref 98–111)
Creatinine, Ser: 1.22 mg/dL (ref 0.61–1.24)
GFR, Estimated: 58 mL/min — ABNORMAL LOW (ref >60)
Glucose, Bld: 124 mg/dL — ABNORMAL HIGH (ref 70–99)
Potassium: 3.6 mmol/L (ref 3.5–5.1)
Sodium: 138 mmol/L (ref 135–145)

## 2020-08-01 LAB — SAMPLE TO BLOOD BANK

## 2020-08-01 MED ORDER — FAMOTIDINE IN NACL 20-0.9 MG/50ML-% IV SOLN
20.0000 mg | Freq: Once | INTRAVENOUS | Status: AC
  Administered 2020-08-01: 10:00:00 20 mg via INTRAVENOUS
  Filled 2020-08-01: qty 50

## 2020-08-01 MED ORDER — SODIUM CHLORIDE 0.9 % IV SOLN
10.0000 mg | Freq: Once | INTRAVENOUS | Status: AC
  Administered 2020-08-01: 10:00:00 10 mg via INTRAVENOUS
  Filled 2020-08-01: qty 10

## 2020-08-01 MED ORDER — SODIUM CHLORIDE 0.9 % IV SOLN
375.0000 mg/m2 | Freq: Once | INTRAVENOUS | Status: AC
  Administered 2020-08-01: 11:00:00 700 mg via INTRAVENOUS
  Filled 2020-08-01: qty 50

## 2020-08-01 MED ORDER — SODIUM CHLORIDE 0.9 % IV SOLN: 375.0000 mg/m2 | Freq: Once | INTRAVENOUS | Status: DC

## 2020-08-01 MED ORDER — DIPHENHYDRAMINE HCL 25 MG PO CAPS
25.0000 mg | ORAL_CAPSULE | Freq: Once | ORAL | Status: AC
  Administered 2020-08-01: 10:00:00 25 mg via ORAL
  Filled 2020-08-01: qty 1

## 2020-08-01 MED ORDER — ACETAMINOPHEN 325 MG PO TABS
650.0000 mg | ORAL_TABLET | Freq: Once | ORAL | Status: AC
  Administered 2020-08-01: 10:00:00 650 mg via ORAL
  Filled 2020-08-01: qty 2

## 2020-08-01 MED ORDER — SODIUM CHLORIDE 0.9 % IV SOLN
Freq: Once | INTRAVENOUS | Status: AC
  Filled 2020-08-01: qty 250

## 2020-08-01 NOTE — Progress Notes (Signed)
Per Dr. Orlie Dakin okay to infuse Ruxience as a rapid infusion, pt to be monitored 1 hour post infusion.   1315: Pt monitored one hour post infusion per Dr. Orlie Dakin. Pt tolerated infusion well, no s/s of distress or reaction noted. Pt and VS stable at discharge.

## 2020-08-06 NOTE — Progress Notes (Signed)
[facility]  [phone] [phone]  ID: AYAD FALLONE OB: [birth year]  MR#: [id number]  AOZ#:[id number]  Patient Care Team: Glori Luis, MD as PCP - General (Family Medicine) Mariah Milling Tollie Pizza, MD as PCP - Cardiology (Cardiology) Antonieta Iba, MD as Consulting Physician (Cardiology) Jeralyn Ruths, MD as Consulting Physician (Oncology) Hulan Fray as Physician Assistant (Urology)  CHIEF COMPLAINT: Recurrent hairy cell leukemia.  INTERVAL HISTORY: Patient returns to clinic today for further evaluation and consideration of cycle 4 of weekly Rituxan.  He continues to have worsening confusion, but otherwise feels well.  Many questions are answered by his son.  He continues to have chronic weakness and fatigue.  He has a fair appetite, but has lost weight in the interim.  He continues to have residual cough and hoarseness of voice. He has no neurologic complaints.  He denies any fevers.  He denies any chest pain, shortness of breath, or hemoptysis.  He denies nausea, vomiting, diarrhea or constipation.  He has no urinary complaints.  Patient offers no further specific complaints today.  REVIEW OF SYSTEMS:   Review of Systems  Constitutional: Positive for malaise/fatigue. Negative for fever and weight loss.  HENT: Positive for sore throat.   Respiratory: Positive for cough. Negative for hemoptysis and shortness of breath.   Cardiovascular: Negative.  Negative for chest pain and leg swelling.  Gastrointestinal: Negative.  Negative for abdominal pain.  Genitourinary: Negative.  Negative for dysuria.  Musculoskeletal: Negative.  Negative for back pain.  Skin: Negative.  Negative for rash.  Neurological: Positive for weakness. Negative for dizziness, focal weakness and headaches.  Psychiatric/Behavioral: Positive for memory loss. The patient is not nervous/anxious.     As per HPI. Otherwise, a complete review of systems is  negative.  PAST MEDICAL HISTORY: Past Medical History:  Diagnosis Date  . Anemia 09/01/2015  . Anxiety 09/11/2015  . Arthritis   . Asthma    as a teenager  . Asymptomatic Sinus Bradycardia   . Atypical chest pain    a. 08/2014  . BPH (benign prostatic hyperplasia)   . CKD (chronic kidney disease), stage III (HCC)   . Closed fracture of tuft of distal phalanx of finger 08/31/2018  . Coronary artery disease    a. 2000 s/p CABG;  b. 10/2008 Neg MV, EF 64%; c. cath 05/2015: LM 85% sev cal, pLAD-1 lesion 80%, pLAD-2 lesion 100% chronic, ostLCx 70% sev cal, OM4 90%, ostRCA 70%, mid RCA 90%, LIMA-LAD patent, VG-OM2 patent, VG-OM3 patent, VG-RPDA patent. No AS  . Dementia without behavioral disturbance (HCC) 05/28/2019  . GERD (gastroesophageal reflux disease)   . H/O echocardiogram    a. 10/2005 Echo: nl EF.  [name] Hairy cell leukemia (HCC)   . HOH (hard of hearing)    Bilateral hearing aids  . HTN (hypertension)   . Hyperlipidemia   . Hypertension   . Leukemia (HCC)   . Neutropenic fever (HCC) 11/08/2019  . Stricture and stenosis of esophagus     PAST SURGICAL HISTORY: Past Surgical History:  Procedure Laterality Date  . CARDIAC CATHETERIZATION N/A 05/26/2015   Procedure: Left Heart Cath;  Surgeon: Antonieta Iba, MD;  Location: [facility] INVASIVE CV LAB;  Service: Cardiovascular;  Laterality: N/A;  . CARDIAC CATHETERIZATION N/A 05/26/2015   Procedure: Coronary/Graft Angiography;  Surgeon: Antonieta Iba, MD;  Location: [facility] INVASIVE CV LAB;  Service: Cardiovascular;  Laterality: N/A;  . CATARACT EXTRACTION    . CORONARY ARTERY BYPASS  GRAFT  2000  . ESOPHAGOGASTRODUODENOSCOPY N/A 02/18/2017   Procedure: ESOPHAGOGASTRODUODENOSCOPY (EGD) with removal of food bolus;  Surgeon: Midge Minium, MD;  Location: [facility] ENDOSCOPY;  Service: Endoscopy;  Laterality: N/A;  . ESOPHAGOGASTRODUODENOSCOPY (EGD) WITH PROPOFOL N/A 03/11/2017   Procedure: ESOPHAGOGASTRODUODENOSCOPY (EGD) WITH PROPOFOL;  Surgeon: Midge Minium, MD;  Location: [facility] ENDOSCOPY;  Service: Endoscopy;  Laterality: N/A;  . ESOPHAGOGASTRODUODENOSCOPY (EGD) WITH PROPOFOL N/A 04/15/2017   Procedure: ESOPHAGOGASTRODUODENOSCOPY (EGD) WITH PROPOFOL;  Surgeon: Midge Minium, MD;  Location: [facility] ENDOSCOPY;  Service: Endoscopy;  Laterality: N/A;  . EYE SURGERY Left    Cataract Extraction with IOL  . INGUINAL HERNIA REPAIR Left 06/24/2016   Procedure: HERNIA REPAIR INGUINAL ADULT;  Surgeon: Earline Mayotte, MD;  Location: [facility] ORS;  Service: General;  Laterality: Left;  . TONSILLECTOMY  1942   . UPPER GASTROINTESTINAL ENDOSCOPY  3 years ago    with Dilation.     FAMILY HISTORY Family History  Problem Relation Age of Onset  . Hypertension Mother   . Arthritis Father   . Other Other        no premature CAD.  [name] Colon cancer Neg Hx   . Prostate cancer Neg Hx   . Stomach cancer Neg Hx        ADVANCED DIRECTIVES:    HEALTH MAINTENANCE: Social History   Tobacco Use  . Smoking status: Never Smoker  . Smokeless tobacco: Never Used  Vaping Use  . Vaping Use: Never used  Substance Use Topics  . Alcohol use: No  . Drug use: No     No Known Allergies  Current Outpatient Medications  Medication Sig Dispense Refill  . atorvastatin (LIPITOR) 40 MG tablet TAKE 1/2 TABLETS (20 MG TOTAL) BY MOUTH DAILY. (Patient taking differently: Take 20 mg by mouth daily.) 45 tablet 3  . Calcium Carbonate Antacid (TUMS CHEWY BITES PO) Take 1 tablet by mouth as needed.     [name] CLINPRO 5000 1.1 % PSTE 1 application as directed. USE IN PLACE OF REGULAR TOOTHPASTE. USE PEASIZED AMOUNT, BRUSH DAILY AT LEAST 2 MINUTES. SPIT OUT.    [name] donepezil (ARICEPT) 10 MG tablet Take 1 tablet (10 mg total) by mouth at bedtime. 90 tablet 1  . finasteride (PROSCAR) 5 MG tablet TAKE 1 TABLET BY MOUTH EVERY DAY (Patient taking differently: Take 5 mg by mouth daily.) 30 tablet 1  . isosorbide mononitrate (IMDUR) 30 MG 24 hr tablet TAKE 1 TABLET BY MOUTH EVERY DAY (Patient  taking differently: Take 30 mg by mouth daily.) 90 tablet 3  . Multiple Vitamin (MULTIVITAMIN) tablet Take 1 tablet by mouth daily.    . pantoprazole (PROTONIX) 40 MG tablet Take 1 tablet (40 mg total) by mouth daily. 30 tablet 1  . tamsulosin (FLOMAX) 0.4 MG CAPS capsule TAKE 2 CAPSULES BY MOUTH EVERY DAY (Patient taking differently: Take 0.8 mg by mouth daily. TAKE 2 CAPSULES BY MOUTH EVERY DAY) 180 capsule 1  . nystatin (MYCOSTATIN) 100000 UNIT/ML suspension Take 5 mLs (500,000 Units total) by mouth 4 (four) times daily. swish in the mouth and retain for as long as possible (several minutes) before swallowing (Patient not taking: Reported on 08/08/2020) 140 mL 0  . ondansetron (ZOFRAN) 8 MG tablet Take 1 tablet (8 mg total) by mouth 2 (two) times daily as needed (Nausea or vomiting). (Patient not taking: Reported on 08/08/2020) 30 tablet 1  . prochlorperazine (COMPAZINE) 10 MG tablet Take 1 tablet (10 mg total) by mouth every 6 (six) hours  as needed (Nausea or vomiting). (Patient not taking: Reported on 08/08/2020) 60 tablet 2   No current facility-administered medications for this visit.    OBJECTIVE: Vitals:   08/08/20 0910  BP: (!) 141/61  Pulse: 78  Resp: 16  Temp: 97.7 F (36.5 C)  SpO2: 99%     Body mass index is 25.29 kg/m.    ECOG FS:2 - Symptomatic, <50% confined to bed  General: Well-developed, well-nourished, no acute distress. Eyes: Pink conjunctiva, anicteric sclera. HEENT: Normocephalic, moist mucous membranes. Lungs: No audible wheezing or coughing. Heart: Regular rate and rhythm. Abdomen: Soft, nontender, no obvious distention. Musculoskeletal: No edema, cyanosis, or clubbing. Neuro: Alert, answering all questions appropriately. Cranial nerves grossly intact. Skin: No rashes or petechiae noted. Psych: Normal affect.  LAB RESULTS:  Lab Results  Component Value Date   NA 138 08/01/2020   K 3.6 08/01/2020   CL 105 08/01/2020   CO2 24 08/01/2020   GLUCOSE 124 (H)  08/01/2020   BUN 15 08/01/2020   CREATININE 1.22 08/01/2020   CALCIUM 8.7 (L) 08/01/2020   PROT 5.7 (L) 07/24/2020   ALBUMIN 2.9 (L) 07/24/2020   AST 24 07/24/2020   ALT 22 07/24/2020   ALKPHOS 50 07/24/2020   BILITOT 1.2 07/24/2020   GFRNONAA 58 (L) 08/01/2020   GFRAA 50 (L) 02/08/2020    Lab Results  Component Value Date   WBC 1.6 (L) 08/08/2020   NEUTROABS 1.4 (L) 08/08/2020   HGB 9.2 (L) 08/08/2020   HCT 27.6 (L) 08/08/2020   MCV 97.9 08/08/2020   PLT 126 (L) 08/08/2020     STUDIES: DG Chest 2 View  Result Date: 07/17/2020 CLINICAL DATA:  Cough, fever, shortness of breath. EXAM: CHEST - 2 VIEW COMPARISON:  Radiograph and CT 11/08/2019 FINDINGS: Post median sternotomy and CABG.The cardiomediastinal contours are normal. There is bronchial thickening. Pulmonary vasculature is normal. No consolidation, pleural effusion, or pneumothorax. Exaggerated thoracic kyphosis with multilevel degenerative change in the thoracic spine. No acute osseous abnormalities are seen. Please note the anterior aspect of the sternum is not included in the field of view on the lateral. IMPRESSION: 1. Bronchial thickening without pneumonia. 2. Post CABG. Electronically Signed   By: Narda Rutherford M.D.   On: 07/17/2020 17:03   CT CHEST WO CONTRAST  Result Date: 07/20/2020 CLINICAL DATA:  Chest pain, shortness of breath. EXAM: CT CHEST WITHOUT CONTRAST TECHNIQUE: Multidetector CT imaging of the chest was performed following the standard protocol without IV contrast. COMPARISON:  CT chest, abdomen, pelvis 10/08/2019. CT chest 07/22/2014 FINDINGS: Cardiovascular: Normal heart size. No significant pericardial effusion. The thoracic aorta is normal in caliber. At least mild atherosclerotic plaque of the thoracic aorta. Four vessel coronary artery calcifications status post coronary artery bypass. Mediastinum/Nodes: No enlarged mediastinal or axillary lymph nodes. Thyroid gland in trachea demonstrate no  significant findings. The esophageal wall appears slightly thickened distally. Lungs/Pleura: Markedly limited evaluation due to respiratory motion artifact. Interval development of suggestion of multifocal peribronchovascular ground-glass airspace opacities that are most prominent within the right upper lobe (24, 3:35, 56, 70). Pulmonary micronodule along the left major fissure (3:24) likely represents an intrapulmonary lymph node. Couple of dense pulmonary micronodules within right upper lobe (3:35). No pulmonary mass. No pleural effusion. No pneumothorax. Upper Abdomen: Redemonstration of a at least 2 cm calcified stone within the gallbladder lumen. 1 cm fluid density lesion within the right hepatic lobe (2:103) that appears similar compared to prior CT abdomen pelvis. Otherwise no acute abnormality. Musculoskeletal: No  chest wall abnormality. No suspicious lytic or blastic osseous lesions. No acute displaced fracture. Multilevel degenerative changes of the spine. IMPRESSION: 1. Markedly limited evaluation of the lung parenchyma due to respiratory motion artifact with suggestion of multifocal peribronchovascular ground-glass airspace opacities that could represent infection/inflammation. Recommend repeat CT in 3-6 months to evaluate for resolution as an underlying adenocarcinoma cannot be excluded. 2. Persistent distal esophageal wall thickening. Correlate with esophagitis. An underlying lesion cannot be excluded. 3. Cholelithiasis. Electronically Signed   By: Tish Frederickson M.D.   On: 07/20/2020 16:42   DG Chest Port 1 View  Result Date: 07/23/2020 CLINICAL DATA:  Neutropenia.  History of asthma. EXAM: PORTABLE CHEST 1 VIEW COMPARISON:  July 19, 2020 FINDINGS: New bibasilar infiltrates. The heart, hila, mediastinum, lungs, and pleura are otherwise unremarkable. IMPRESSION: New bibasilar infiltrates worrisome for pneumonia. Electronically Signed   By: Gerome Sam III M.D   On: 07/23/2020 14:51   DG  Chest Port 1 View  Result Date: 07/19/2020 CLINICAL DATA:  Increased shortness of breath EXAM: PORTABLE CHEST 1 VIEW COMPARISON:  07/18/2020 and prior FINDINGS: Mild hypoinflation. No pneumothorax or pleural effusion. Minimal bibasilar opacities. Diffuse interstitial prominence. Postsurgical appearance of the cardiomediastinal silhouette is unchanged. No acute osseous abnormality. IMPRESSION: Diffuse interstitial prominence and minimal bibasilar opacities, edema versus infection. Electronically Signed   By: Stana Bunting M.D.   On: 07/19/2020 07:53   DG Chest Port 1 View  Result Date: 07/18/2020 CLINICAL DATA:  Question sepsis EXAM: PORTABLE CHEST 1 VIEW COMPARISON:  07/17/2020 FINDINGS: Postop CABG.  Heart size normal.  Negative for heart failure. Mild bibasilar atelectasis has developed since yesterday. No effusion. IMPRESSION: Interval development of mild bibasilar atelectasis/infiltrate. Electronically Signed   By: Marlan Palau M.D.   On: 07/18/2020 10:38      ONCOLOGY HISTORY:  Patient initially received cladribine in 2006 and then was treated for a relapse in 2011 and again in 2014. Bone marrow biopsy at that time confirmed recurrence of disease with 80-90% hairy cell leukemia noted in his specimen. Patient completed cladribine 0.15 mg/kg on days 1 through 5 along with 8 weekly cycles of Rituxan on April 18, 2015. Bone marrow biopsy results after completion of 8 weeks of Rituxan without evidence of residual disease.  Patient typically has no evidence of disease and peripheral blood.   ASSESSMENT: Recurrent hairy cell leukemia.  PLAN:   1.  Recurrent hairy cell leukemia: See oncology history as above.  Bone marrow biopsy on November 02, 2019 revealed recurrent hairy cell leukemia, but percentage of involvement was not reported.  Patient received his cladribine from July 03, 2020 through July 07, 2020. He received cycle 2 of weekly Rituxan on July 10, 2020.  Treatment was  then delayed several weeks secondary to hospital admission.  Proceed with cycle 4 of 8 of weekly Rituxan today.  Return to clinic in 1 week for further evaluation and consideration of cycle 5. 2. Thrombocytopenia: Chronic and unchanged.  Patient's platelet count is 126 today. 3.  Leukopenia: Chronic and unchanged.  ANC is 1.4 today. Proceed with Rituxan as above. 4.  Anemia: Hemoglobin improved to 9.2, monitor.  5.  Cardiac disease: Continue follow-up and treatment per Dr. Mariah Milling. 6.  Renal insufficiency: Essentially resolved with a creatinine of 1.22. 7.  Pneumonia: Patient has now completed antibiotics. 8.  Sore throat/cough: Patient has completed nystatin.  Continue Protonix as prescribed.  Follow-up with primary care physician tomorrow. 9.  Weight loss: Patient was instructed to increase his  calorie count using Boost supplements.  Patient expressed understanding and was in agreement with this plan. He also understands that He can call clinic at any time with any questions, concerns, or complaints.    Jeralyn Ruths, MD 08/08/20 9:29 AM

## 2020-08-07 ENCOUNTER — Inpatient Hospital Stay: Payer: PPO | Admitting: Family Medicine

## 2020-08-08 ENCOUNTER — Inpatient Hospital Stay: Payer: PPO | Attending: Oncology

## 2020-08-08 ENCOUNTER — Encounter: Payer: Self-pay | Admitting: Oncology

## 2020-08-08 ENCOUNTER — Other Ambulatory Visit: Payer: Self-pay | Admitting: Family Medicine

## 2020-08-08 ENCOUNTER — Inpatient Hospital Stay: Payer: PPO

## 2020-08-08 ENCOUNTER — Other Ambulatory Visit: Payer: Self-pay

## 2020-08-08 ENCOUNTER — Inpatient Hospital Stay: Payer: PPO | Admitting: Oncology

## 2020-08-08 VITALS — BP 141/61 | HR 78 | Temp 97.7°F | Resp 16 | Wt 152.0 lb

## 2020-08-08 VITALS — BP 141/61 | HR 69 | Resp 16

## 2020-08-08 DIAGNOSIS — R490 Dysphonia: Secondary | ICD-10-CM | POA: Insufficient documentation

## 2020-08-08 DIAGNOSIS — Z79899 Other long term (current) drug therapy: Secondary | ICD-10-CM | POA: Diagnosis not present

## 2020-08-08 DIAGNOSIS — K219 Gastro-esophageal reflux disease without esophagitis: Secondary | ICD-10-CM | POA: Diagnosis not present

## 2020-08-08 DIAGNOSIS — Z951 Presence of aortocoronary bypass graft: Secondary | ICD-10-CM | POA: Diagnosis not present

## 2020-08-08 DIAGNOSIS — R634 Abnormal weight loss: Secondary | ICD-10-CM | POA: Insufficient documentation

## 2020-08-08 DIAGNOSIS — Z5112 Encounter for antineoplastic immunotherapy: Secondary | ICD-10-CM | POA: Diagnosis not present

## 2020-08-08 DIAGNOSIS — R531 Weakness: Secondary | ICD-10-CM | POA: Insufficient documentation

## 2020-08-08 DIAGNOSIS — M1990 Unspecified osteoarthritis, unspecified site: Secondary | ICD-10-CM | POA: Insufficient documentation

## 2020-08-08 DIAGNOSIS — Z9221 Personal history of antineoplastic chemotherapy: Secondary | ICD-10-CM | POA: Insufficient documentation

## 2020-08-08 DIAGNOSIS — N183 Chronic kidney disease, stage 3 unspecified: Secondary | ICD-10-CM | POA: Diagnosis not present

## 2020-08-08 DIAGNOSIS — N400 Enlarged prostate without lower urinary tract symptoms: Secondary | ICD-10-CM | POA: Diagnosis not present

## 2020-08-08 DIAGNOSIS — I2510 Atherosclerotic heart disease of native coronary artery without angina pectoris: Secondary | ICD-10-CM | POA: Diagnosis not present

## 2020-08-08 DIAGNOSIS — D649 Anemia, unspecified: Secondary | ICD-10-CM | POA: Insufficient documentation

## 2020-08-08 DIAGNOSIS — J029 Acute pharyngitis, unspecified: Secondary | ICD-10-CM | POA: Insufficient documentation

## 2020-08-08 DIAGNOSIS — F0390 Unspecified dementia without behavioral disturbance: Secondary | ICD-10-CM | POA: Insufficient documentation

## 2020-08-08 DIAGNOSIS — C9142 Hairy cell leukemia, in relapse: Secondary | ICD-10-CM

## 2020-08-08 DIAGNOSIS — R5383 Other fatigue: Secondary | ICD-10-CM | POA: Diagnosis not present

## 2020-08-08 DIAGNOSIS — J189 Pneumonia, unspecified organism: Secondary | ICD-10-CM | POA: Diagnosis not present

## 2020-08-08 DIAGNOSIS — C9140 Hairy cell leukemia not having achieved remission: Secondary | ICD-10-CM | POA: Diagnosis not present

## 2020-08-08 DIAGNOSIS — I129 Hypertensive chronic kidney disease with stage 1 through stage 4 chronic kidney disease, or unspecified chronic kidney disease: Secondary | ICD-10-CM | POA: Diagnosis not present

## 2020-08-08 LAB — CBC WITH DIFFERENTIAL/PLATELET
Abs Immature Granulocytes: 0 10*3/uL (ref 0.00–0.07)
Basophils Absolute: 0 10*3/uL (ref 0.0–0.1)
Basophils Relative: 0 %
Eosinophils Absolute: 0.1 10*3/uL (ref 0.0–0.5)
Eosinophils Relative: 3 %
HCT: 27.6 % — ABNORMAL LOW (ref 39.0–52.0)
Hemoglobin: 9.2 g/dL — ABNORMAL LOW (ref 13.0–17.0)
Immature Granulocytes: 0 %
Lymphocytes Relative: 6 %
Lymphs Abs: 0.1 10*3/uL — ABNORMAL LOW (ref 0.7–4.0)
MCH: 32.6 pg (ref 26.0–34.0)
MCHC: 33.3 g/dL (ref 30.0–36.0)
MCV: 97.9 fL (ref 80.0–100.0)
Monocytes Absolute: 0.1 10*3/uL (ref 0.1–1.0)
Monocytes Relative: 4 %
Neutro Abs: 1.4 10*3/uL — ABNORMAL LOW (ref 1.7–7.7)
Neutrophils Relative %: 87 %
Platelets: 126 10*3/uL — ABNORMAL LOW (ref 150–400)
RBC: 2.82 MIL/uL — ABNORMAL LOW (ref 4.22–5.81)
RDW: 17 % — ABNORMAL HIGH (ref 11.5–15.5)
WBC: 1.6 10*3/uL — ABNORMAL LOW (ref 4.0–10.5)
nRBC: 0 % (ref 0.0–0.2)

## 2020-08-08 LAB — BASIC METABOLIC PANEL
Anion gap: 10 (ref 5–15)
BUN: 18 mg/dL (ref 8–23)
CO2: 25 mmol/L (ref 22–32)
Calcium: 8.8 mg/dL — ABNORMAL LOW (ref 8.9–10.3)
Chloride: 102 mmol/L (ref 98–111)
Creatinine, Ser: 1.33 mg/dL — ABNORMAL HIGH (ref 0.61–1.24)
GFR, Estimated: 52 mL/min — ABNORMAL LOW (ref >60)
Glucose, Bld: 129 mg/dL — ABNORMAL HIGH (ref 70–99)
Potassium: 3.4 mmol/L — ABNORMAL LOW (ref 3.5–5.1)
Sodium: 137 mmol/L (ref 135–145)

## 2020-08-08 LAB — SAMPLE TO BLOOD BANK

## 2020-08-08 MED ORDER — DIPHENHYDRAMINE HCL 25 MG PO CAPS
25.0000 mg | ORAL_CAPSULE | Freq: Once | ORAL | Status: AC
  Administered 2020-08-08: 25 mg via ORAL
  Filled 2020-08-08: qty 1

## 2020-08-08 MED ORDER — ACETAMINOPHEN 325 MG PO TABS
650.0000 mg | ORAL_TABLET | Freq: Once | ORAL | Status: AC
  Administered 2020-08-08: 650 mg via ORAL
  Filled 2020-08-08: qty 2

## 2020-08-08 MED ORDER — SODIUM CHLORIDE 0.9 % IV SOLN
Freq: Once | INTRAVENOUS | Status: AC
  Filled 2020-08-08: qty 250

## 2020-08-08 MED ORDER — SODIUM CHLORIDE 0.9 % IV SOLN
375.0000 mg/m2 | Freq: Once | INTRAVENOUS | Status: AC
  Administered 2020-08-08: 700 mg via INTRAVENOUS
  Filled 2020-08-08: qty 50

## 2020-08-08 MED ORDER — SODIUM CHLORIDE 0.9 % IV SOLN: 375.0000 mg/m2 | Freq: Once | INTRAVENOUS | Status: DC

## 2020-08-08 MED ORDER — SODIUM CHLORIDE 0.9 % IV SOLN
10.0000 mg | Freq: Once | INTRAVENOUS | Status: AC
  Administered 2020-08-08: 10 mg via INTRAVENOUS
  Filled 2020-08-08: qty 1

## 2020-08-08 MED ORDER — FAMOTIDINE IN NACL 20-0.9 MG/50ML-% IV SOLN
20.0000 mg | Freq: Once | INTRAVENOUS | Status: AC
  Administered 2020-08-08: 20 mg via INTRAVENOUS
  Filled 2020-08-08: qty 50

## 2020-08-08 NOTE — Progress Notes (Signed)
Patient tolerated rapid infusion of Ruxience well without any complications. Patient was monitored for an hour after the infusion. Vital signs stable at discharge.

## 2020-08-09 ENCOUNTER — Other Ambulatory Visit: Payer: Self-pay

## 2020-08-09 ENCOUNTER — Ambulatory Visit (INDEPENDENT_AMBULATORY_CARE_PROVIDER_SITE_OTHER): Payer: PPO | Admitting: Family Medicine

## 2020-08-09 ENCOUNTER — Other Ambulatory Visit: Payer: Self-pay | Admitting: Family Medicine

## 2020-08-09 ENCOUNTER — Encounter: Payer: Self-pay | Admitting: Family Medicine

## 2020-08-09 DIAGNOSIS — N3000 Acute cystitis without hematuria: Secondary | ICD-10-CM | POA: Diagnosis not present

## 2020-08-09 DIAGNOSIS — J189 Pneumonia, unspecified organism: Secondary | ICD-10-CM

## 2020-08-09 DIAGNOSIS — K219 Gastro-esophageal reflux disease without esophagitis: Secondary | ICD-10-CM | POA: Diagnosis not present

## 2020-08-09 DIAGNOSIS — F0390 Unspecified dementia without behavioral disturbance: Secondary | ICD-10-CM

## 2020-08-09 NOTE — Patient Instructions (Signed)
Nice to see you. We will get a Chest ct ordered for you.  Please see GI as planned. Please drink 2 boost drinks per day. If your throat does not continue to improve please let us know.

## 2020-08-10 ENCOUNTER — Encounter: Payer: Self-pay | Admitting: Family Medicine

## 2020-08-10 DIAGNOSIS — N390 Urinary tract infection, site not specified: Secondary | ICD-10-CM | POA: Insufficient documentation

## 2020-08-10 DIAGNOSIS — J189 Pneumonia, unspecified organism: Secondary | ICD-10-CM

## 2020-08-10 HISTORY — DX: Pneumonia, unspecified organism: J18.9

## 2020-08-10 NOTE — Assessment & Plan Note (Signed)
Patient's son brought in paperwork for home care.  I reviewed this and it does not appear that there is anything for me to fill out or sign.  The patient's son will submit this and if they need anything from [place] we will complete it once they send it.

## 2020-08-10 NOTE — Assessment & Plan Note (Signed)
Overall significantly improved.  We will plan for follow-up chest CT in about 3 months.

## 2020-08-10 NOTE — Assessment & Plan Note (Addendum)
Overall doing well on Protonix.  He will keep his GI appointment as he will likely need an endoscopy to evaluate the area of thickening in his esophagus on his CT imaging.  He will continue Protonix 40 mg daily.

## 2020-08-10 NOTE — Progress Notes (Signed)
Marikay Alar, MD Phone: [phone]  Frank Mcdaniel is a 87 y.o. male who presents today for follow-up.  Community-acquired pneumonia/UTI/neutropenic fever: Patient was hospitalized after he was found to be febrile and neutropenic.  This was found in preparation for a chemotherapy treatment.  He subsequently was found to have a UTI and community-acquired pneumonia.  He was started on IV antibiotics and transitioned to Augmentin.  He has finished that and is overall doing well.  He did have sore throat while hospitalized and was started on fluconazole for presumed oropharyngeal candidiasis.  The patient notes his throat is no longer sore though he does remain quite hoarse.  He has been swallowing okay.  On imaging they did notice that part of his esophagus was thickened and they started him on Protonix.  He does have a GI appointment later this month.  He has been eating better the last couple of days.  They are going to do boost 2 times a day.  There was recommendation for follow-up CT imaging in 3 to 6 months.  Social History   Tobacco Use  Smoking Status Never Smoker  Smokeless Tobacco Never Used    Current Outpatient Medications on File Prior to Visit  Medication Sig Dispense Refill  . atorvastatin (LIPITOR) 40 MG tablet TAKE 1/2 TABLETS (20 MG TOTAL) BY MOUTH DAILY. (Patient taking differently: Take 20 mg by mouth daily.) 45 tablet 3  . Calcium Carbonate Antacid (TUMS CHEWY BITES PO) Take 1 tablet by mouth as needed.     [name] CLINPRO 5000 1.1 % PSTE 1 application as directed. USE IN PLACE OF REGULAR TOOTHPASTE. USE PEASIZED AMOUNT, BRUSH DAILY AT LEAST 2 MINUTES. SPIT OUT.    [name] donepezil (ARICEPT) 10 MG tablet Take 1 tablet (10 mg total) by mouth at bedtime. 90 tablet 1  . finasteride (PROSCAR) 5 MG tablet TAKE 1 TABLET BY MOUTH EVERY DAY 30 tablet 1  . isosorbide mononitrate (IMDUR) 30 MG 24 hr tablet TAKE 1 TABLET BY MOUTH EVERY DAY (Patient taking differently: Take 30 mg by mouth  daily.) 90 tablet 3  . Multiple Vitamin (MULTIVITAMIN) tablet Take 1 tablet by mouth daily.    . ondansetron (ZOFRAN) 8 MG tablet Take 1 tablet (8 mg total) by mouth 2 (two) times daily as needed (Nausea or vomiting). 30 tablet 1  . pantoprazole (PROTONIX) 40 MG tablet Take 1 tablet (40 mg total) by mouth daily. 30 tablet 1  . prochlorperazine (COMPAZINE) 10 MG tablet Take 1 tablet (10 mg total) by mouth every 6 (six) hours as needed (Nausea or vomiting). 60 tablet 2  . tamsulosin (FLOMAX) 0.4 MG CAPS capsule TAKE 2 CAPSULES BY MOUTH EVERY DAY (Patient taking differently: Take 0.8 mg by mouth daily. TAKE 2 CAPSULES BY MOUTH EVERY DAY) 180 capsule 1   No current facility-administered medications on file prior to visit.     ROS see history of present illness  Objective  Physical Exam Vitals:   08/09/20 1524  BP: (!) 142/78  Pulse: 61  Temp: 97.7 F (36.5 C)  SpO2: 99%    BP Readings from Last 3 Encounters:  08/09/20 (!) 142/78  08/08/20 (!) 141/61  08/08/20 (!) 141/61   Wt Readings from Last 3 Encounters:  08/09/20 157 lb (71.2 kg)  08/08/20 152 lb (68.9 kg)  08/01/20 157 lb 7 oz (71.4 kg)    Physical Exam Constitutional:      General: He is not in acute distress.    Appearance: He is not  diaphoretic.  HENT:     Mouth/Throat:     Mouth: Mucous membranes are moist.     Pharynx: Oropharynx is clear. No oropharyngeal exudate.  Cardiovascular:     Rate and Rhythm: Normal rate and regular rhythm.     Heart sounds: Normal heart sounds.  Pulmonary:     Effort: Pulmonary effort is normal.     Breath sounds: Normal breath sounds.  Abdominal:     General: Bowel sounds are normal. There is no distension.     Palpations: Abdomen is soft.     Tenderness: There is no abdominal tenderness. There is no guarding or rebound.  Musculoskeletal:        General: No edema.     Right lower leg: No edema.     Left lower leg: No edema.  Skin:    General: Skin is warm and dry.   Neurological:     Mental Status: He is alert.      Assessment/Plan: Please see individual problem list.  Problem List Items Addressed This Visit    Community acquired pneumonia    Overall significantly improved.  We will plan for follow-up chest CT in about 3 months.      Relevant Orders   CT Chest Wo Contrast   Dementia without behavioral disturbance [facility])    Patient's son brought in paperwork for home care.  I reviewed this and it does not appear that there is anything for me to fill out or sign.  The patient's son will submit this and if they need anything from [place] we will complete it once they send it.      GERD (gastroesophageal reflux disease)    Overall doing well on Protonix.  He will keep his GI appointment as he will likely need an endoscopy to evaluate the area of thickening in his esophagus on his CT imaging.  He will continue Protonix 40 mg daily.      UTI (urinary tract infection)    No reported urinary symptoms.  He will monitor for recurrence.         This visit occurred during the SARS-CoV-2 public health emergency.  Safety protocols were in place, including screening questions prior to the visit, additional usage of staff PPE, and extensive cleaning of exam room while observing appropriate contact time as indicated for disinfecting solutions.    Marikay Alar, MD [facility]

## 2020-08-10 NOTE — Assessment & Plan Note (Signed)
No reported urinary symptoms.  He will monitor for recurrence.

## 2020-08-12 NOTE — Progress Notes (Signed)
[place]  [phone] [phone]  ID: Frank Mcdaniel OB: [birth year]  MR#: [id number]  UXN#:[id number]  Patient Care Team: [name], MD as PCP - General (Family Medicine) Rockey Situ Kathlene November, MD as PCP - Cardiology (Cardiology) [name], MD as Consulting Physician (Cardiology) [name], MD as Consulting Physician (Oncology) [name] as Physician Assistant (Urology)  CHIEF COMPLAINT: Recurrent hairy cell leukemia.  INTERVAL HISTORY: Patient returns to clinic today for further evaluation and consideration of cycle 5 of weekly Rituxan.  He continues to have baseline confusion, but otherwise feels well. Many questions are answered by his son.  He continues to have chronic weakness and fatigue.  He has a fair appetite, but his weight is stable.  He continues to have residual cough and hoarseness of voice but this is improving. He has no neurologic complaints.  He denies any fevers.  He denies any chest pain, shortness of breath, or hemoptysis.  He denies nausea, vomiting, diarrhea or constipation.  He has no urinary complaints.  Patient offers no further specific complaints today.  REVIEW OF SYSTEMS:   Review of Systems  Constitutional: Positive for malaise/fatigue. Negative for fever and weight loss.  HENT: Positive for sore throat.   Respiratory: Positive for cough. Negative for hemoptysis and shortness of breath.   Cardiovascular: Negative.  Negative for chest pain and leg swelling.  Gastrointestinal: Negative.  Negative for abdominal pain.  Genitourinary: Negative.  Negative for dysuria.  Musculoskeletal: Negative.  Negative for back pain.  Skin: Negative.  Negative for rash.  Neurological: Positive for weakness. Negative for dizziness, focal weakness and headaches.  Psychiatric/Behavioral: Positive for memory loss. The patient is not nervous/anxious.     As per HPI. Otherwise, a complete review of  systems is negative.  PAST MEDICAL HISTORY: Past Medical History:  Diagnosis Date  . Anemia 09/01/2015  . Anxiety 09/11/2015  . Arthritis   . Asthma    as a teenager  . Asymptomatic Sinus Bradycardia   . Atypical chest pain    a. 08/2014  . BPH (benign prostatic hyperplasia)   . CKD (chronic kidney disease), stage III ([place])   . Closed fracture of tuft of distal phalanx of finger 08/31/2018  . Community acquired pneumonia 08/10/2020  . Coronary artery disease    a. 2000 s/p CABG;  b. 10/2008 Neg MV, EF 64%; c. cath 05/2015: LM 85% sev cal, pLAD-1 lesion 80%, pLAD-2 lesion 100% chronic, ostLCx 70% sev cal, OM4 90%, ostRCA 70%, mid RCA 90%, LIMA-LAD patent, VG-OM2 patent, VG-OM3 patent, VG-RPDA patent. No AS  . Dementia without behavioral disturbance ([place]) 05/28/2019  . GERD (gastroesophageal reflux disease)   . H/O echocardiogram    a. 10/2005 Echo: nl EF.  [name] Hairy cell leukemia ([place])   . HOH (hard of hearing)    Bilateral hearing aids  . HTN (hypertension)   . Hyperlipidemia   . Hypertension   . Leukemia ([place])   . Neutropenic fever ([place]) 11/08/2019  . Stricture and stenosis of esophagus     PAST SURGICAL HISTORY: Past Surgical History:  Procedure Laterality Date  . CARDIAC CATHETERIZATION N/A 05/26/2015   Procedure: Left Heart Cath;  Surgeon: [name], MD;  Location: [facility];  Service: Cardiovascular;  Laterality: N/A;  . CARDIAC CATHETERIZATION N/A 05/26/2015   Procedure: Coronary/Graft Angiography;  Surgeon: [name], MD;  Location: [facility];  Service: Cardiovascular;  Laterality: N/A;  . CATARACT EXTRACTION    .  CORONARY ARTERY BYPASS GRAFT  2000  . ESOPHAGOGASTRODUODENOSCOPY N/A 02/18/2017   Procedure: ESOPHAGOGASTRODUODENOSCOPY (EGD) with removal of food bolus;  Surgeon: [name], MD;  Location: [facility];  Service: Endoscopy;  Laterality: N/A;  . ESOPHAGOGASTRODUODENOSCOPY (EGD) WITH PROPOFOL N/A 03/11/2017   Procedure:  ESOPHAGOGASTRODUODENOSCOPY (EGD) WITH PROPOFOL;  Surgeon: [name], MD;  Location: [facility] ENDOSCOPY;  Service: Endoscopy;  Laterality: N/A;  . ESOPHAGOGASTRODUODENOSCOPY (EGD) WITH PROPOFOL N/A 04/15/2017   Procedure: ESOPHAGOGASTRODUODENOSCOPY (EGD) WITH PROPOFOL;  Surgeon: [name], MD;  Location: [facility] ENDOSCOPY;  Service: Endoscopy;  Laterality: N/A;  . EYE SURGERY Left    Cataract Extraction with IOL  . INGUINAL HERNIA REPAIR Left 06/24/2016   Procedure: HERNIA REPAIR INGUINAL ADULT;  Surgeon: [name], MD;  Location: [facility] ORS;  Service: General;  Laterality: Left;  . TONSILLECTOMY  1942   . UPPER GASTROINTESTINAL ENDOSCOPY  3 years ago    with Dilation.     FAMILY HISTORY Family History  Problem Relation Age of Onset  . Hypertension Mother   . Arthritis Father   . Other Other        no premature CAD.  [name] Colon cancer Neg Hx   . Prostate cancer Neg Hx   . Stomach cancer Neg Hx        ADVANCED DIRECTIVES:    HEALTH MAINTENANCE: Social History   Tobacco Use  . Smoking status: Never Smoker  . Smokeless tobacco: Never Used  Vaping Use  . Vaping Use: Never used  Substance Use Topics  . Alcohol use: No  . Drug use: No     No Known Allergies  Current Outpatient Medications  Medication Sig Dispense Refill  . atorvastatin (LIPITOR) 40 MG tablet TAKE 1/2 TABLETS (20 MG TOTAL) BY MOUTH DAILY. (Patient taking differently: Take 20 mg by mouth daily.) 45 tablet 3  . Calcium Carbonate Antacid (TUMS CHEWY BITES PO) Take 1 tablet by mouth as needed.     [name] CLINPRO 5000 1.1 % PSTE 1 application as directed. USE IN PLACE OF REGULAR TOOTHPASTE. USE PEASIZED AMOUNT, BRUSH DAILY AT LEAST 2 MINUTES. SPIT OUT.    [name] donepezil (ARICEPT) 10 MG tablet Take 1 tablet (10 mg total) by mouth at bedtime. 90 tablet 1  . finasteride (PROSCAR) 5 MG tablet TAKE 1 TABLET BY MOUTH EVERY DAY 30 tablet 1  . isosorbide mononitrate (IMDUR) 30 MG 24 hr tablet TAKE 1 TABLET BY MOUTH EVERY DAY  (Patient taking differently: Take 30 mg by mouth daily.) 90 tablet 3  . Multiple Vitamin (MULTIVITAMIN) tablet Take 1 tablet by mouth daily.    . ondansetron (ZOFRAN) 8 MG tablet Take 1 tablet (8 mg total) by mouth 2 (two) times daily as needed (Nausea or vomiting). 30 tablet 1  . pantoprazole (PROTONIX) 40 MG tablet Take 1 tablet (40 mg total) by mouth daily. 30 tablet 1  . prochlorperazine (COMPAZINE) 10 MG tablet Take 1 tablet (10 mg total) by mouth every 6 (six) hours as needed (Nausea or vomiting). 60 tablet 2  . tamsulosin (FLOMAX) 0.4 MG CAPS capsule TAKE 2 CAPSULES BY MOUTH EVERY DAY (Patient taking differently: Take 0.8 mg by mouth daily. TAKE 2 CAPSULES BY MOUTH EVERY DAY) 180 capsule 1   No current facility-administered medications for this visit.    OBJECTIVE: Vitals:   08/15/20 0908  BP: (!) 133/50  Pulse: 64  Resp: 20  Temp: (!) 97.5 F (36.4 C)  SpO2: 100%     Body mass index is 25.51  kg/m.    ECOG FS:1 - Symptomatic but completely ambulatory  General: Well-developed, well-nourished, no acute distress. Eyes: Pink conjunctiva, anicteric sclera. HEENT: Normocephalic, moist mucous membranes. Lungs: No audible wheezing or coughing. Heart: Regular rate and rhythm. Abdomen: Soft, nontender, no obvious distention. Musculoskeletal: No edema, cyanosis, or clubbing. Neuro: Alert, answering all questions appropriately. Cranial nerves grossly intact. Skin: No rashes or petechiae noted. Psych: Normal affect.  LAB RESULTS:  Lab Results  Component Value Date   NA 137 08/15/2020   K 3.4 (L) 08/15/2020   CL 103 08/15/2020   CO2 25 08/15/2020   GLUCOSE 133 (H) 08/15/2020   BUN 17 08/15/2020   CREATININE 1.25 (H) 08/15/2020   CALCIUM 8.8 (L) 08/15/2020   PROT 5.7 (L) 07/24/2020   ALBUMIN 2.9 (L) 07/24/2020   AST 24 07/24/2020   ALT 22 07/24/2020   ALKPHOS 50 07/24/2020   BILITOT 1.2 07/24/2020   GFRNONAA 56 (L) 08/15/2020   GFRAA 50 (L) 02/08/2020    Lab Results   Component Value Date   WBC 2.2 (L) 08/15/2020   NEUTROABS 1.9 08/15/2020   HGB 9.0 (L) 08/15/2020   HCT 26.7 (L) 08/15/2020   MCV 100.4 (H) 08/15/2020   PLT 92 (L) 08/15/2020     STUDIES: DG Chest 2 View  Result Date: 07/17/2020 CLINICAL DATA:  Cough, fever, shortness of breath. EXAM: CHEST - 2 VIEW COMPARISON:  Radiograph and CT 11/08/2019 FINDINGS: Post median sternotomy and CABG.The cardiomediastinal contours are normal. There is bronchial thickening. Pulmonary vasculature is normal. No consolidation, pleural effusion, or pneumothorax. Exaggerated thoracic kyphosis with multilevel degenerative change in the thoracic spine. No acute osseous abnormalities are seen. Please note the anterior aspect of the sternum is not included in the field of view on the lateral. IMPRESSION: 1. Bronchial thickening without pneumonia. 2. Post CABG. Electronically Signed   By: Keith Rake M.D.   On: 07/17/2020 17:03   CT CHEST WO CONTRAST  Result Date: 07/20/2020 CLINICAL DATA:  Chest pain, shortness of breath. EXAM: CT CHEST WITHOUT CONTRAST TECHNIQUE: Multidetector CT imaging of the chest was performed following the standard protocol without IV contrast. COMPARISON:  CT chest, abdomen, pelvis 10/08/2019. CT chest 07/22/2014 FINDINGS: Cardiovascular: Normal heart size. No significant pericardial effusion. The thoracic aorta is normal in caliber. At least mild atherosclerotic plaque of the thoracic aorta. Four vessel coronary artery calcifications status post coronary artery bypass. Mediastinum/Nodes: No enlarged mediastinal or axillary lymph nodes. Thyroid gland in trachea demonstrate no significant findings. The esophageal wall appears slightly thickened distally. Lungs/Pleura: Markedly limited evaluation due to respiratory motion artifact. Interval development of suggestion of multifocal peribronchovascular ground-glass airspace opacities that are most prominent within the right upper lobe (24, 3:35, 56,  70). Pulmonary micronodule along the left major fissure (3:24) likely represents an intrapulmonary lymph node. Couple of dense pulmonary micronodules within right upper lobe (3:35). No pulmonary mass. No pleural effusion. No pneumothorax. Upper Abdomen: Redemonstration of a at least 2 cm calcified stone within the gallbladder lumen. 1 cm fluid density lesion within the right hepatic lobe (2:103) that appears similar compared to prior CT abdomen pelvis. Otherwise no acute abnormality. Musculoskeletal: No chest wall abnormality. No suspicious lytic or blastic osseous lesions. No acute displaced fracture. Multilevel degenerative changes of the spine. IMPRESSION: 1. Markedly limited evaluation of the lung parenchyma due to respiratory motion artifact with suggestion of multifocal peribronchovascular ground-glass airspace opacities that could represent infection/inflammation. Recommend repeat CT in 3-6 months to evaluate for resolution as an underlying  adenocarcinoma cannot be excluded. 2. Persistent distal esophageal wall thickening. Correlate with esophagitis. An underlying lesion cannot be excluded. 3. Cholelithiasis. Electronically Signed   By: Iven Finn M.D.   On: 07/20/2020 16:42   DG Chest Port 1 View  Result Date: 07/23/2020 CLINICAL DATA:  Neutropenia.  History of asthma. EXAM: PORTABLE CHEST 1 VIEW COMPARISON:  July 19, 2020 FINDINGS: New bibasilar infiltrates. The heart, hila, mediastinum, lungs, and pleura are otherwise unremarkable. IMPRESSION: New bibasilar infiltrates worrisome for pneumonia. Electronically Signed   By: Dorise Bullion III M.D   On: 07/23/2020 14:51   DG Chest Port 1 View  Result Date: 07/19/2020 CLINICAL DATA:  Increased shortness of breath EXAM: PORTABLE CHEST 1 VIEW COMPARISON:  07/18/2020 and prior FINDINGS: Mild hypoinflation. No pneumothorax or pleural effusion. Minimal bibasilar opacities. Diffuse interstitial prominence. Postsurgical appearance of the  cardiomediastinal silhouette is unchanged. No acute osseous abnormality. IMPRESSION: Diffuse interstitial prominence and minimal bibasilar opacities, edema versus infection. Electronically Signed   By: Primitivo Gauze M.D.   On: 07/19/2020 07:53   DG Chest Port 1 View  Result Date: 07/18/2020 CLINICAL DATA:  Question sepsis EXAM: PORTABLE CHEST 1 VIEW COMPARISON:  07/17/2020 FINDINGS: Postop CABG.  Heart size normal.  Negative for heart failure. Mild bibasilar atelectasis has developed since yesterday. No effusion. IMPRESSION: Interval development of mild bibasilar atelectasis/infiltrate. Electronically Signed   By: Franchot Gallo M.D.   On: 07/18/2020 10:38      ONCOLOGY HISTORY:  Patient initially received cladribine in 2006 and then was treated for a relapse in 2011 and again in 2014. Bone marrow biopsy at that time confirmed recurrence of disease with 80-90% hairy cell leukemia noted in his specimen. Patient completed cladribine 0.15 mg/kg on days 1 through 5 along with 8 weekly cycles of Rituxan on April 18, 2015. Bone marrow biopsy results after completion of 8 weeks of Rituxan without evidence of residual disease.  Patient typically has no evidence of disease and peripheral blood.   ASSESSMENT: Recurrent hairy cell leukemia.  PLAN:   1.  Recurrent hairy cell leukemia: See oncology history as above.  Bone marrow biopsy on November 02, 2019 revealed recurrent hairy cell leukemia, but percentage of involvement was not reported.  Patient received his cladribine from July 03, 2020 through July 07, 2020. He received cycle 2 of weekly Rituxan on July 10, 2020.  Treatment was then delayed several weeks secondary to hospital admission.  Proceed with cycle 5 of 8 of weekly Rituxan today.  Return to clinic in 1 week for further evaluation and consideration of cycle 6.   2. Thrombocytopenia: Chronic and unchanged.  Patient's platelet counts trended down slightly to 92. 3.  Leukopenia:  Improved.  ANC is 1.9 today. 4.  Anemia: Chronic and unchanged.  Patient's hemoglobin is 9.0 today. 5.  Cardiac disease: Continue follow-up and treatment per Dr. Rockey Situ. 6.  Renal insufficiency: Creatinine 1.25 today. 7.  Pneumonia: Patient has now completed antibiotics. 8.  Sore throat/cough: Improved.  Patient has completed nystatin.  Continue Protonix as prescribed.  Follow-up with primary care physician tomorrow. 9.  Weight loss: Improved.  Patient expressed understanding and was in agreement with this plan. He also understands that He can call clinic at any time with any questions, concerns, or complaints.    [name], MD 08/16/20 6:34 AM

## 2020-08-15 ENCOUNTER — Encounter: Payer: Self-pay | Admitting: Oncology

## 2020-08-15 ENCOUNTER — Inpatient Hospital Stay: Payer: PPO | Admitting: Oncology

## 2020-08-15 ENCOUNTER — Inpatient Hospital Stay: Payer: PPO

## 2020-08-15 VITALS — BP 120/55 | HR 50 | Temp 98.3°F | Resp 18

## 2020-08-15 VITALS — BP 133/50 | HR 64 | Temp 97.5°F | Resp 20 | Wt 153.3 lb

## 2020-08-15 DIAGNOSIS — C9142 Hairy cell leukemia, in relapse: Secondary | ICD-10-CM

## 2020-08-15 DIAGNOSIS — Z5112 Encounter for antineoplastic immunotherapy: Secondary | ICD-10-CM | POA: Diagnosis not present

## 2020-08-15 LAB — BASIC METABOLIC PANEL
Anion gap: 9 (ref 5–15)
BUN: 17 mg/dL (ref 8–23)
CO2: 25 mmol/L (ref 22–32)
Calcium: 8.8 mg/dL — ABNORMAL LOW (ref 8.9–10.3)
Chloride: 103 mmol/L (ref 98–111)
Creatinine, Ser: 1.25 mg/dL — ABNORMAL HIGH (ref 0.61–1.24)
GFR, Estimated: 56 mL/min — ABNORMAL LOW (ref >60)
Glucose, Bld: 133 mg/dL — ABNORMAL HIGH (ref 70–99)
Potassium: 3.4 mmol/L — ABNORMAL LOW (ref 3.5–5.1)
Sodium: 137 mmol/L (ref 135–145)

## 2020-08-15 LAB — SAMPLE TO BLOOD BANK

## 2020-08-15 LAB — CBC WITH DIFFERENTIAL/PLATELET
Abs Immature Granulocytes: 0.01 10*3/uL (ref 0.00–0.07)
Basophils Absolute: 0 10*3/uL (ref 0.0–0.1)
Basophils Relative: 1 %
Eosinophils Absolute: 0 10*3/uL (ref 0.0–0.5)
Eosinophils Relative: 1 %
HCT: 26.7 % — ABNORMAL LOW (ref 39.0–52.0)
Hemoglobin: 9 g/dL — ABNORMAL LOW (ref 13.0–17.0)
Immature Granulocytes: 1 %
Lymphocytes Relative: 5 %
Lymphs Abs: 0.1 10*3/uL — ABNORMAL LOW (ref 0.7–4.0)
MCH: 33.8 pg (ref 26.0–34.0)
MCHC: 33.7 g/dL (ref 30.0–36.0)
MCV: 100.4 fL — ABNORMAL HIGH (ref 80.0–100.0)
Monocytes Absolute: 0.1 10*3/uL (ref 0.1–1.0)
Monocytes Relative: 4 %
Neutro Abs: 1.9 10*3/uL (ref 1.7–7.7)
Neutrophils Relative %: 88 %
Platelets: 92 10*3/uL — ABNORMAL LOW (ref 150–400)
RBC: 2.66 MIL/uL — ABNORMAL LOW (ref 4.22–5.81)
RDW: 18.1 % — ABNORMAL HIGH (ref 11.5–15.5)
WBC: 2.2 10*3/uL — ABNORMAL LOW (ref 4.0–10.5)
nRBC: 0 % (ref 0.0–0.2)

## 2020-08-15 MED ORDER — FAMOTIDINE IN NACL 20-0.9 MG/50ML-% IV SOLN
20.0000 mg | Freq: Once | INTRAVENOUS | Status: AC
  Administered 2020-08-15: 20 mg via INTRAVENOUS
  Filled 2020-08-15: qty 50

## 2020-08-15 MED ORDER — SODIUM CHLORIDE 0.9 % IV SOLN
10.0000 mg | Freq: Once | INTRAVENOUS | Status: AC
  Administered 2020-08-15: 10 mg via INTRAVENOUS
  Filled 2020-08-15: qty 10

## 2020-08-15 MED ORDER — SODIUM CHLORIDE 0.9 % IV SOLN
Freq: Once | INTRAVENOUS | Status: AC
  Filled 2020-08-15: qty 250

## 2020-08-15 MED ORDER — DIPHENHYDRAMINE HCL 25 MG PO CAPS
25.0000 mg | ORAL_CAPSULE | Freq: Once | ORAL | Status: AC
  Administered 2020-08-15: 25 mg via ORAL
  Filled 2020-08-15: qty 1

## 2020-08-15 MED ORDER — ACETAMINOPHEN 325 MG PO TABS
650.0000 mg | ORAL_TABLET | Freq: Once | ORAL | Status: AC
  Administered 2020-08-15: 650 mg via ORAL
  Filled 2020-08-15: qty 2

## 2020-08-15 MED ORDER — SODIUM CHLORIDE 0.9 % IV SOLN
375.0000 mg/m2 | Freq: Once | INTRAVENOUS | Status: AC
  Administered 2020-08-15: 700 mg via INTRAVENOUS
  Filled 2020-08-15: qty 50

## 2020-08-15 NOTE — Progress Notes (Signed)
1440: Pt tolerated infusion well. Pt monitored 1 hour post infusion. Pt and VS stable at discharge. No s/s of distress or reaction noted at discharge.

## 2020-08-15 NOTE — Progress Notes (Signed)
Adding extra pre meds, but MD still wants to proceed with rapid infusion.

## 2020-08-15 NOTE — Progress Notes (Unsigned)
Patient denies any concerns today.  

## 2020-08-17 ENCOUNTER — Encounter: Payer: Self-pay | Admitting: Gastroenterology

## 2020-08-17 ENCOUNTER — Other Ambulatory Visit: Payer: Self-pay

## 2020-08-17 ENCOUNTER — Ambulatory Visit: Payer: PPO | Admitting: Gastroenterology

## 2020-08-17 VITALS — BP 138/8 | HR 76 | Temp 97.6°F | Wt 156.1 lb

## 2020-08-17 DIAGNOSIS — K2289 Other specified disease of esophagus: Secondary | ICD-10-CM

## 2020-08-17 DIAGNOSIS — Z8719 Personal history of other diseases of the digestive system: Secondary | ICD-10-CM

## 2020-08-17 NOTE — Progress Notes (Signed)
[name], MD [address]  [place]  [place], [place] [address]  Main: [phone]  Fax: [phone]    Gastroenterology Consultation  Referring Provider:     [name], MD Primary Care Physician:  [name], MD Primary Gastroenterologist:  Dr. [name] Reason for Consultation:   Thickening of the esophagus, sore throat       HPI:   Frank Mcdaniel is a 87 y.o. male referred by Dr. Caryl Bis, Angela Adam, MD  for consultation & management of rectal bleeding, loose stools.  Patient has history of hairy cell leukemia, previously on chemo, closely followed by Dr. [name], macrocytic anemia, pancytopenia.  Patient is accompanied by his son today.  Patient has been experiencing 3 weeks history of diarrhea, 5-6 times daily, loose bowel movements, not watery and mild rectal bleeding on wiping.  He underwent stool studies on 5/29 and found to have C. difficile infection, positive for antigen and toxin.  Therefore he started on oral vancomycin 125 mg 4 times a day for 10 days.  Patient reports that his increased bowel frequency has improved to 2-3 times daily.  His son continually wanted to discuss about rectal bleeding.  Patient denies any weight loss, abdominal pain, nausea or vomiting.  Is tolerating vancomycin well.  He denies any rectal pain or discomfort, perianal itching, prolapse.  His last colonoscopy was at least 10 years ago  Follow-up visit 08/17/2020 Patient was referred to me due to abnormal CT chest which revealed mild thickening of the distal esophagus. It was a month ago, patient developed severe sore throat, cough and hoarseness of voice, underwent CT chest for possible bronchitis and was also hospitalized. In the hospital, he was treated for febrile neutropenia, community-acquired pneumonia as well as Enterococcus faecalis UTI. He was started on pantoprazole 40 mg daily as he was complaining of severe sore throat. Since discharge, his sore throat  has gradually improved and resolved 1 week ago. He denies any difficulty swallowing. He had EGD in 2018 for dysphagia, found to have mild stricture which was dilated. Patient is accompanied by his son today. Patient denies any dysphagia.  He does not smoke or drink alcohol  NSAIDs: None  Antiplts/Anticoagulants/Anti thrombotics: None  GI Procedures: Colonoscopy at least 10 years ago Upper endoscopy by Dr. Allen Norris in 04/2017, found to have hiatal hernia, mild stricture of the GE junction, dilated to 39mm  - Benign-appearing esophageal stenosis. Dilated. - Normal stomach. - Normal examined duodenum. - No specimens collected.  Past Medical History:  Diagnosis Date  . Anemia 09/01/2015  . Anxiety 09/11/2015  . Arthritis   . Asthma    as a teenager  . Asymptomatic Sinus Bradycardia   . Atypical chest pain    a. 08/2014  . BPH (benign prostatic hyperplasia)   . CKD (chronic kidney disease), stage III ([place])   . Closed fracture of tuft of distal phalanx of finger 08/31/2018  . Community acquired pneumonia 08/10/2020  . Coronary artery disease    a. 2000 s/p CABG;  b. 10/2008 Neg MV, EF 64%; c. cath 05/2015: LM 85% sev cal, pLAD-1 lesion 80%, pLAD-2 lesion 100% chronic, ostLCx 70% sev cal, OM4 90%, ostRCA 70%, mid RCA 90%, LIMA-LAD patent, VG-OM2 patent, VG-OM3 patent, VG-RPDA patent. No AS  . Dementia without behavioral disturbance ([place]) 05/28/2019  . GERD (gastroesophageal reflux disease)   . H/O echocardiogram    a. 10/2005 Echo: nl EF.  [name] Hairy cell leukemia ([place])   .  HOH (hard of hearing)    Bilateral hearing aids  . HTN (hypertension)   . Hyperlipidemia   . Hypertension   . Leukemia ([place])   . Neutropenic fever ([place]) 11/08/2019  . Stricture and stenosis of esophagus     Past Surgical History:  Procedure Laterality Date  . CARDIAC CATHETERIZATION N/A 05/26/2015   Procedure: Left Heart Cath;  Surgeon: [name], MD;  Location: [facility];  Service: Cardiovascular;   Laterality: N/A;  . CARDIAC CATHETERIZATION N/A 05/26/2015   Procedure: Coronary/Graft Angiography;  Surgeon: [name], MD;  Location: [facility];  Service: Cardiovascular;  Laterality: N/A;  . CATARACT EXTRACTION    . CORONARY ARTERY BYPASS GRAFT  2000  . ESOPHAGOGASTRODUODENOSCOPY N/A 02/18/2017   Procedure: ESOPHAGOGASTRODUODENOSCOPY (EGD) with removal of food bolus;  Surgeon: [name], MD;  Location: [facility]COPY;  Service: Endoscopy;  Laterality: N/A;  . ESOPHAGOGASTRODUODENOSCOPY (EGD) WITH PROPOFOL N/A 03/11/2017   Procedure: ESOPHAGOGASTRODUODENOSCOPY (EGD) WITH PROPOFOL;  Surgeon: [name], MD;  Location: [facility] ENDOSCOPY;  Service: Endoscopy;  Laterality: N/A;  . ESOPHAGOGASTRODUODENOSCOPY (EGD) WITH PROPOFOL N/A 04/15/2017   Procedure: ESOPHAGOGASTRODUODENOSCOPY (EGD) WITH PROPOFOL;  Surgeon: [name], MD;  Location: [facility] ENDOSCOPY;  Service: Endoscopy;  Laterality: N/A;  . EYE SURGERY Left    Cataract Extraction with IOL  . INGUINAL HERNIA REPAIR Left 06/24/2016   Procedure: HERNIA REPAIR INGUINAL ADULT;  Surgeon: [name], MD;  Location: [facility] ORS;  Service: General;  Laterality: Left;  . TONSILLECTOMY  1942   . UPPER GASTROINTESTINAL ENDOSCOPY  3 years ago    with Dilation.     Current Outpatient Medications:  .  atorvastatin (LIPITOR) 40 MG tablet, TAKE 1/2 TABLETS (20 MG TOTAL) BY MOUTH DAILY. (Patient taking differently: Take 20 mg by mouth daily.), Disp: 45 tablet, Rfl: 3 .  Calcium Carbonate Antacid (TUMS CHEWY BITES PO), Take 1 tablet by mouth as needed. , Disp: , Rfl:  .  CLINPRO 5000 1.1 % PSTE, 1 application as directed. USE IN PLACE OF REGULAR TOOTHPASTE. USE PEASIZED AMOUNT, BRUSH DAILY AT LEAST 2 MINUTES. SPIT OUT., Disp: , Rfl:  .  donepezil (ARICEPT) 10 MG tablet, Take 1 tablet (10 mg total) by mouth at bedtime., Disp: 90 tablet, Rfl: 1 .  finasteride (PROSCAR) 5 MG tablet, TAKE 1 TABLET BY MOUTH EVERY DAY, Disp: 30 tablet, Rfl:  1 .  isosorbide mononitrate (IMDUR) 30 MG 24 hr tablet, TAKE 1 TABLET BY MOUTH EVERY DAY (Patient taking differently: Take 30 mg by mouth daily.), Disp: 90 tablet, Rfl: 3 .  Multiple Vitamin (MULTIVITAMIN) tablet, Take 1 tablet by mouth daily., Disp: , Rfl:  .  ondansetron (ZOFRAN) 8 MG tablet, Take 1 tablet (8 mg total) by mouth 2 (two) times daily as needed (Nausea or vomiting)., Disp: 30 tablet, Rfl: 1 .  pantoprazole (PROTONIX) 40 MG tablet, Take 1 tablet (40 mg total) by mouth daily., Disp: 30 tablet, Rfl: 1 .  prochlorperazine (COMPAZINE) 10 MG tablet, Take 1 tablet (10 mg total) by mouth every 6 (six) hours as needed (Nausea or vomiting)., Disp: 60 tablet, Rfl: 2 .  tamsulosin (FLOMAX) 0.4 MG CAPS capsule, TAKE 2 CAPSULES BY MOUTH EVERY DAY (Patient taking differently: Take 0.8 mg by mouth daily. TAKE 2 CAPSULES BY MOUTH EVERY DAY), Disp: 180 capsule, Rfl: 1    Family History  Problem Relation Age of Onset  . Hypertension Mother   . Arthritis Father   . Other Other  no premature CAD.  [name] Colon cancer Neg Hx   . Prostate cancer Neg Hx   . Stomach cancer Neg Hx      Social History   Tobacco Use  . Smoking status: Never Smoker  . Smokeless tobacco: Never Used  Vaping Use  . Vaping Use: Never used  Substance Use Topics  . Alcohol use: No  . Drug use: No    Allergies as of 08/17/2020  . (No Known Allergies)    Review of Systems:    All systems reviewed and negative except where noted in HPI.   Physical Exam:  BP (!) 138/8 (BP Location: Left Arm, Patient Position: Sitting, Cuff Size: Normal)   Pulse 76   Temp 97.6 F (36.4 C) (Oral)   Wt 156 lb 2 oz (70.8 kg)   BMI 25.98 kg/m  No LMP for male patient.  General:   Alert,  Well-developed, well-nourished, pleasant and cooperative in NAD Head:  Normocephalic and atraumatic. Eyes:  Sclera clear, no icterus.   Conjunctiva pink. Ears: Decreased auditory acuity. Nose:  No deformity, discharge, or lesions. Mouth:   No deformity or lesions,oropharynx pink & moist. Neck:  Supple; no masses or thyromegaly. Lungs:  Respirations even and unlabored.  Clear throughout to auscultation.   No wheezes, crackles, or rhonchi. No acute distress. Heart:  Regular rate and rhythm; no murmurs, clicks, rubs, or gallops. Abdomen:  Normal bowel sounds. Soft, non-tender and non-distended without masses, hepatosplenomegaly or hernias noted.  No guarding or rebound tenderness.   Rectal: Not performed Msk:  Symmetrical without gross deformities. Good, equal movement & strength bilaterally. Pulses:  Normal pulses noted. Extremities:  No clubbing or edema.  No cyanosis. Neurologic:  Alert and oriented x3;  grossly normal neurologically. Skin:  Intact without significant lesions or rashes. No jaundice. Psych:  Alert and cooperative. Normal mood and affect.  Imaging Studies: Reviewed  Assessment and Plan:   MICHAELA SHANKEL is a 87 y.o. male with history of hairy cell leukemia, coronary disease s/p CABG, hypertension, hyperlipidemia, CKD, pancytopenia, history of C. difficile diarrhea in 2021 is seen in consultation for sore throat and distal esophageal thickening on the CT scan  Esophageal thickening on CT scan and sore throat, prior history of dysphagia Recommend upper endoscopy for further evaluation, rule out malignancy, erosive esophagitis and possible dilation Continue pantoprazole 40 mg daily before breakfast   Follow up based on above work-up  [name], MD

## 2020-08-19 NOTE — Progress Notes (Deleted)
[place]  Telephone:([phone] [phone]  ID: Frank Mcdaniel OB: May 18, 1933  MR#: [id number]  YWV#:[id number]  Patient Care Team: [name], MD as PCP - General (Family Medicine) Rockey Situ Kathlene November, MD as PCP - Cardiology (Cardiology) [name], MD as Consulting Physician (Cardiology) [name], MD as Consulting Physician (Oncology) [name] as Physician Assistant (Urology)  CHIEF COMPLAINT: Recurrent hairy cell leukemia.  INTERVAL HISTORY: Patient returns to clinic today for further evaluation and consideration of cycle 5 of weekly Rituxan.  He continues to have baseline confusion, but otherwise feels well. Many questions are answered by his son.  He continues to have chronic weakness and fatigue.  He has a fair appetite, but his weight is stable.  He continues to have residual cough and hoarseness of voice but this is improving. He has no neurologic complaints.  He denies any fevers.  He denies any chest pain, shortness of breath, or hemoptysis.  He denies nausea, vomiting, diarrhea or constipation.  He has no urinary complaints.  Patient offers no further specific complaints today.  REVIEW OF SYSTEMS:   Review of Systems  Constitutional: Positive for malaise/fatigue. Negative for fever and weight loss.  HENT: Positive for sore throat.   Respiratory: Positive for cough. Negative for hemoptysis and shortness of breath.   Cardiovascular: Negative.  Negative for chest pain and leg swelling.  Gastrointestinal: Negative.  Negative for abdominal pain.  Genitourinary: Negative.  Negative for dysuria.  Musculoskeletal: Negative.  Negative for back pain.  Skin: Negative.  Negative for rash.  Neurological: Positive for weakness. Negative for dizziness, focal weakness and headaches.  Psychiatric/Behavioral: Positive for memory loss. The patient is not nervous/anxious.     As per HPI. Otherwise, a complete review of  systems is negative.  PAST MEDICAL HISTORY: Past Medical History:  Diagnosis Date  . Anemia 09/01/2015  . Anxiety 09/11/2015  . Arthritis   . Asthma    as a teenager  . Asymptomatic Sinus Bradycardia   . Atypical chest pain    a. 08/2014  . BPH (benign prostatic hyperplasia)   . CKD (chronic kidney disease), stage III ([place])   . Closed fracture of tuft of distal phalanx of finger 08/31/2018  . Community acquired pneumonia 08/10/2020  . Coronary artery disease    a. 2000 s/p CABG;  b. 10/2008 Neg MV, EF 64%; c. cath 05/2015: LM 85% sev cal, pLAD-1 lesion 80%, pLAD-2 lesion 100% chronic, ostLCx 70% sev cal, OM4 90%, ostRCA 70%, mid RCA 90%, LIMA-LAD patent, VG-OM2 patent, VG-OM3 patent, VG-RPDA patent. No AS  . Dementia without behavioral disturbance ([place]) 05/28/2019  . GERD (gastroesophageal reflux disease)   . H/O echocardiogram    a. 10/2005 Echo: nl EF.  [name] Hairy cell leukemia ([place])   . HOH (hard of hearing)    Bilateral hearing aids  . HTN (hypertension)   . Hyperlipidemia   . Hypertension   . Leukemia ([place])   . Neutropenic fever ([place]) 11/08/2019  . Stricture and stenosis of esophagus     PAST SURGICAL HISTORY: Past Surgical History:  Procedure Laterality Date  . CARDIAC CATHETERIZATION N/A 05/26/2015   Procedure: Left Heart Cath;  Surgeon: [name], MD;  Location: [place] [facility];  Service: Cardiovascular;  Laterality: N/A;  . CARDIAC CATHETERIZATION N/A 05/26/2015   Procedure: Coronary/Graft Angiography;  Surgeon: [name], MD;  Location: [place] [facility];  Service: Cardiovascular;  Laterality: N/A;  . CATARACT EXTRACTION    .  CORONARY ARTERY BYPASS GRAFT  2000  . ESOPHAGOGASTRODUODENOSCOPY N/A 02/18/2017   Procedure: ESOPHAGOGASTRODUODENOSCOPY (EGD) with removal of food bolus;  Surgeon: [name], MD;  Location: [facility]COPY;  Service: Endoscopy;  Laterality: N/A;  . ESOPHAGOGASTRODUODENOSCOPY (EGD) WITH PROPOFOL N/A 03/11/2017   Procedure:  ESOPHAGOGASTRODUODENOSCOPY (EGD) WITH PROPOFOL;  Surgeon: [name], MD;  Location: [facility] ENDOSCOPY;  Service: Endoscopy;  Laterality: N/A;  . ESOPHAGOGASTRODUODENOSCOPY (EGD) WITH PROPOFOL N/A 04/15/2017   Procedure: ESOPHAGOGASTRODUODENOSCOPY (EGD) WITH PROPOFOL;  Surgeon: [name], MD;  Location: [facility] ENDOSCOPY;  Service: Endoscopy;  Laterality: N/A;  . EYE SURGERY Left    Cataract Extraction with IOL  . INGUINAL HERNIA REPAIR Left 06/24/2016   Procedure: HERNIA REPAIR INGUINAL ADULT;  Surgeon: [name], MD;  Location: [facility] ORS;  Service: General;  Laterality: Left;  . TONSILLECTOMY  1942   . UPPER GASTROINTESTINAL ENDOSCOPY  3 years ago    with Dilation.     FAMILY HISTORY Family History  Problem Relation Age of Onset  . Hypertension Mother   . Arthritis Father   . Other Other        no premature CAD.  [name] Colon cancer Neg Hx   . Prostate cancer Neg Hx   . Stomach cancer Neg Hx        ADVANCED DIRECTIVES:    HEALTH MAINTENANCE: Social History   Tobacco Use  . Smoking status: Never Smoker  . Smokeless tobacco: Never Used  Vaping Use  . Vaping Use: Never used  Substance Use Topics  . Alcohol use: No  . Drug use: No     No Known Allergies  Current Outpatient Medications  Medication Sig Dispense Refill  . atorvastatin (LIPITOR) 40 MG tablet TAKE 1/2 TABLETS (20 MG TOTAL) BY MOUTH DAILY. (Patient taking differently: Take 20 mg by mouth daily.) 45 tablet 3  . Calcium Carbonate Antacid (TUMS CHEWY BITES PO) Take 1 tablet by mouth as needed.     [name] CLINPRO 5000 1.1 % PSTE 1 application as directed. USE IN PLACE OF REGULAR TOOTHPASTE. USE PEASIZED AMOUNT, BRUSH DAILY AT LEAST 2 MINUTES. SPIT OUT.    [name] donepezil (ARICEPT) 10 MG tablet Take 1 tablet (10 mg total) by mouth at bedtime. 90 tablet 1  . finasteride (PROSCAR) 5 MG tablet TAKE 1 TABLET BY MOUTH EVERY DAY 30 tablet 1  . isosorbide mononitrate (IMDUR) 30 MG 24 hr tablet TAKE 1 TABLET BY MOUTH EVERY DAY  (Patient taking differently: Take 30 mg by mouth daily.) 90 tablet 3  . Multiple Vitamin (MULTIVITAMIN) tablet Take 1 tablet by mouth daily.    . ondansetron (ZOFRAN) 8 MG tablet Take 1 tablet (8 mg total) by mouth 2 (two) times daily as needed (Nausea or vomiting). 30 tablet 1  . pantoprazole (PROTONIX) 40 MG tablet Take 1 tablet (40 mg total) by mouth daily. 30 tablet 1  . prochlorperazine (COMPAZINE) 10 MG tablet Take 1 tablet (10 mg total) by mouth every 6 (six) hours as needed (Nausea or vomiting). 60 tablet 2  . tamsulosin (FLOMAX) 0.4 MG CAPS capsule TAKE 2 CAPSULES BY MOUTH EVERY DAY (Patient taking differently: Take 0.8 mg by mouth daily. TAKE 2 CAPSULES BY MOUTH EVERY DAY) 180 capsule 1   No current facility-administered medications for this visit.    OBJECTIVE: There were no vitals filed for this visit.   There is no height or weight on file to calculate BMI.    ECOG FS:1 - Symptomatic but completely ambulatory  General: Well-developed,  well-nourished, no acute distress. Eyes: Pink conjunctiva, anicteric sclera. HEENT: Normocephalic, moist mucous membranes. Lungs: No audible wheezing or coughing. Heart: Regular rate and rhythm. Abdomen: Soft, nontender, no obvious distention. Musculoskeletal: No edema, cyanosis, or clubbing. Neuro: Alert, answering all questions appropriately. Cranial nerves grossly intact. Skin: No rashes or petechiae noted. Psych: Normal affect.  LAB RESULTS:  Lab Results  Component Value Date   NA 137 08/15/2020   K 3.4 (L) 08/15/2020   CL 103 08/15/2020   CO2 25 08/15/2020   GLUCOSE 133 (H) 08/15/2020   BUN 17 08/15/2020   CREATININE 1.25 (H) 08/15/2020   CALCIUM 8.8 (L) 08/15/2020   PROT 5.7 (L) 07/24/2020   ALBUMIN 2.9 (L) 07/24/2020   AST 24 07/24/2020   ALT 22 07/24/2020   ALKPHOS 50 07/24/2020   BILITOT 1.2 07/24/2020   GFRNONAA 56 (L) 08/15/2020   GFRAA 50 (L) 02/08/2020    Lab Results  Component Value Date   WBC 2.2 (L) 08/15/2020    NEUTROABS 1.9 08/15/2020   HGB 9.0 (L) 08/15/2020   HCT 26.7 (L) 08/15/2020   MCV 100.4 (H) 08/15/2020   PLT 92 (L) 08/15/2020     STUDIES: CT CHEST WO CONTRAST  Result Date: 07/20/2020 CLINICAL DATA:  Chest pain, shortness of breath. EXAM: CT CHEST WITHOUT CONTRAST TECHNIQUE: Multidetector CT imaging of the chest was performed following the standard protocol without IV contrast. COMPARISON:  CT chest, abdomen, pelvis 10/08/2019. CT chest 07/22/2014 FINDINGS: Cardiovascular: Normal heart size. No significant pericardial effusion. The thoracic aorta is normal in caliber. At least mild atherosclerotic plaque of the thoracic aorta. Four vessel coronary artery calcifications status post coronary artery bypass. Mediastinum/Nodes: No enlarged mediastinal or axillary lymph nodes. Thyroid gland in trachea demonstrate no significant findings. The esophageal wall appears slightly thickened distally. Lungs/Pleura: Markedly limited evaluation due to respiratory motion artifact. Interval development of suggestion of multifocal peribronchovascular ground-glass airspace opacities that are most prominent within the right upper lobe (24, 3:35, 56, 70). Pulmonary micronodule along the left major fissure (3:24) likely represents an intrapulmonary lymph node. Couple of dense pulmonary micronodules within right upper lobe (3:35). No pulmonary mass. No pleural effusion. No pneumothorax. Upper Abdomen: Redemonstration of a at least 2 cm calcified stone within the gallbladder lumen. 1 cm fluid density lesion within the right hepatic lobe (2:103) that appears similar compared to prior CT abdomen pelvis. Otherwise no acute abnormality. Musculoskeletal: No chest wall abnormality. No suspicious lytic or blastic osseous lesions. No acute displaced fracture. Multilevel degenerative changes of the spine. IMPRESSION: 1. Markedly limited evaluation of the lung parenchyma due to respiratory motion artifact with suggestion of  multifocal peribronchovascular ground-glass airspace opacities that could represent infection/inflammation. Recommend repeat CT in 3-6 months to evaluate for resolution as an underlying adenocarcinoma cannot be excluded. 2. Persistent distal esophageal wall thickening. Correlate with esophagitis. An underlying lesion cannot be excluded. 3. Cholelithiasis. Electronically Signed   By: Iven Finn M.D.   On: 07/20/2020 16:42   DG Chest Port 1 View  Result Date: 07/23/2020 CLINICAL DATA:  Neutropenia.  History of asthma. EXAM: PORTABLE CHEST 1 VIEW COMPARISON:  July 19, 2020 FINDINGS: New bibasilar infiltrates. The heart, hila, mediastinum, lungs, and pleura are otherwise unremarkable. IMPRESSION: New bibasilar infiltrates worrisome for pneumonia. Electronically Signed   By: Dorise Bullion III M.D   On: 07/23/2020 14:51      ONCOLOGY HISTORY:  Patient initially received cladribine in 2006 and then was treated for a relapse in 2011 and again in  2014. Bone marrow biopsy at that time confirmed recurrence of disease with 80-90% hairy cell leukemia noted in his specimen. Patient completed cladribine 0.15 mg/kg on days 1 through 5 along with 8 weekly cycles of Rituxan on April 18, 2015. Bone marrow biopsy results after completion of 8 weeks of Rituxan without evidence of residual disease.  Patient typically has no evidence of disease and peripheral blood.   ASSESSMENT: Recurrent hairy cell leukemia.  PLAN:   1.  Recurrent hairy cell leukemia: See oncology history as above.  Bone marrow biopsy on November 02, 2019 revealed recurrent hairy cell leukemia, but percentage of involvement was not reported.  Patient received his cladribine from July 03, 2020 through July 07, 2020. He received cycle 2 of weekly Rituxan on July 10, 2020.  Treatment was then delayed several weeks secondary to hospital admission.  Proceed with cycle 5 of 8 of weekly Rituxan today.  Return to clinic in 1 week for further  evaluation and consideration of cycle 6.   2. Thrombocytopenia: Chronic and unchanged.  Patient's platelet counts trended down slightly to 92. 3.  Leukopenia: Improved.  ANC is 1.9 today. 4.  Anemia: Chronic and unchanged.  Patient's hemoglobin is 9.0 today. 5.  Cardiac disease: Continue follow-up and treatment per Dr. Rockey Situ. 6.  Renal insufficiency: Creatinine 1.25 today. 7.  Pneumonia: Patient has now completed antibiotics. 8.  Sore throat/cough: Improved.  Patient has completed nystatin.  Continue Protonix as prescribed.  Follow-up with primary care physician tomorrow. 9.  Weight loss: Improved.  Patient expressed understanding and was in agreement with this plan. He also understands that He can call clinic at any time with any questions, concerns, or complaints.    [name], MD 08/19/20 7:46 AM

## 2020-08-22 ENCOUNTER — Inpatient Hospital Stay: Payer: PPO

## 2020-08-22 ENCOUNTER — Inpatient Hospital Stay: Payer: PPO | Admitting: Oncology

## 2020-08-22 ENCOUNTER — Inpatient Hospital Stay (HOSPITAL_BASED_OUTPATIENT_CLINIC_OR_DEPARTMENT_OTHER): Payer: PPO | Admitting: Oncology

## 2020-08-22 ENCOUNTER — Encounter: Payer: Self-pay | Admitting: Oncology

## 2020-08-22 VITALS — BP 126/74 | HR 59 | Temp 97.0°F | Resp 19

## 2020-08-22 VITALS — BP 136/63 | HR 63 | Temp 98.0°F | Resp 20 | Wt 155.1 lb

## 2020-08-22 DIAGNOSIS — C9142 Hairy cell leukemia, in relapse: Secondary | ICD-10-CM

## 2020-08-22 DIAGNOSIS — Z5112 Encounter for antineoplastic immunotherapy: Secondary | ICD-10-CM | POA: Diagnosis not present

## 2020-08-22 DIAGNOSIS — D61818 Other pancytopenia: Secondary | ICD-10-CM

## 2020-08-22 LAB — BASIC METABOLIC PANEL
Anion gap: 5 (ref 5–15)
BUN: 18 mg/dL (ref 8–23)
CO2: 27 mmol/L (ref 22–32)
Calcium: 8.8 mg/dL — ABNORMAL LOW (ref 8.9–10.3)
Chloride: 106 mmol/L (ref 98–111)
Creatinine, Ser: 1.12 mg/dL (ref 0.61–1.24)
GFR, Estimated: >60 mL/min (ref >60)
Glucose, Bld: 101 mg/dL — ABNORMAL HIGH (ref 70–99)
Potassium: 3.4 mmol/L — ABNORMAL LOW (ref 3.5–5.1)
Sodium: 138 mmol/L (ref 135–145)

## 2020-08-22 LAB — CBC WITH DIFFERENTIAL/PLATELET
Abs Immature Granulocytes: 0.01 10*3/uL (ref 0.00–0.07)
Basophils Absolute: 0 10*3/uL (ref 0.0–0.1)
Basophils Relative: 0 %
Eosinophils Absolute: 0.1 10*3/uL (ref 0.0–0.5)
Eosinophils Relative: 3 %
HCT: 26.6 % — ABNORMAL LOW (ref 39.0–52.0)
Hemoglobin: 9.1 g/dL — ABNORMAL LOW (ref 13.0–17.0)
Immature Granulocytes: 1 %
Lymphocytes Relative: 6 %
Lymphs Abs: 0.1 10*3/uL — ABNORMAL LOW (ref 0.7–4.0)
MCH: 34.9 pg — ABNORMAL HIGH (ref 26.0–34.0)
MCHC: 34.2 g/dL (ref 30.0–36.0)
MCV: 101.9 fL — ABNORMAL HIGH (ref 80.0–100.0)
Monocytes Absolute: 0.1 10*3/uL (ref 0.1–1.0)
Monocytes Relative: 5 %
Neutro Abs: 1.6 10*3/uL — ABNORMAL LOW (ref 1.7–7.7)
Neutrophils Relative %: 85 %
Platelets: 90 10*3/uL — ABNORMAL LOW (ref 150–400)
RBC: 2.61 MIL/uL — ABNORMAL LOW (ref 4.22–5.81)
RDW: 18.3 % — ABNORMAL HIGH (ref 11.5–15.5)
WBC: 1.9 10*3/uL — ABNORMAL LOW (ref 4.0–10.5)
nRBC: 0 % (ref 0.0–0.2)

## 2020-08-22 LAB — SAMPLE TO BLOOD BANK

## 2020-08-22 MED ORDER — FAMOTIDINE IN NACL 20-0.9 MG/50ML-% IV SOLN
20.0000 mg | Freq: Once | INTRAVENOUS | Status: AC
  Administered 2020-08-22: 20 mg via INTRAVENOUS
  Filled 2020-08-22: qty 50

## 2020-08-22 MED ORDER — SODIUM CHLORIDE 0.9 % IV SOLN
375.0000 mg/m2 | Freq: Once | INTRAVENOUS | Status: AC
  Administered 2020-08-22: 700 mg via INTRAVENOUS
  Filled 2020-08-22: qty 50

## 2020-08-22 MED ORDER — DIPHENHYDRAMINE HCL 25 MG PO CAPS
25.0000 mg | ORAL_CAPSULE | Freq: Once | ORAL | Status: AC
  Administered 2020-08-22: 25 mg via ORAL
  Filled 2020-08-22: qty 1

## 2020-08-22 MED ORDER — SODIUM CHLORIDE 0.9 % IV SOLN
10.0000 mg | Freq: Once | INTRAVENOUS | Status: AC
  Administered 2020-08-22: 10 mg via INTRAVENOUS
  Filled 2020-08-22: qty 10

## 2020-08-22 MED ORDER — SODIUM CHLORIDE 0.9 % IV SOLN
Freq: Once | INTRAVENOUS | Status: AC
  Filled 2020-08-22: qty 250

## 2020-08-22 MED ORDER — ACETAMINOPHEN 325 MG PO TABS
650.0000 mg | ORAL_TABLET | Freq: Once | ORAL | Status: AC
  Administered 2020-08-22: 650 mg via ORAL
  Filled 2020-08-22: qty 2

## 2020-08-22 NOTE — Progress Notes (Signed)
[place]  Telephone:([phone] [phone]  ID: Frank Mcdaniel OB: Jun 16, 1933  MR#: [id number]  HTD#:[id number]  Patient Care Team: [name], MD as PCP - General (Family Medicine) Rockey Situ Kathlene November, MD as PCP - Cardiology (Cardiology) [name], MD as Consulting Physician (Cardiology) [name], MD as Consulting Physician (Oncology) [name] as Physician Assistant (Urology)  CHIEF COMPLAINT: Recurrent hairy cell leukemia.  INTERVAL HISTORY: Patient returns to clinic today for further evaluation and consideration of cycle 6 of 8 of weekly Rituxan.  He continues to have baseline confusion, but otherwise feels well and is tolerating his treatments without significant side effects. Many questions are answered by his son.  He continues to have chronic weakness and fatigue.  He has a fair appetite, but his weight is stable.  He continues to have residual cough and hoarseness of voice but this is improving. He has no neurologic complaints.  He denies any fevers.  He denies any chest pain, shortness of breath, or hemoptysis.  He denies nausea, vomiting, diarrhea or constipation.  He has no urinary complaints.  Patient offers no further specific complaints today.  REVIEW OF SYSTEMS:   Review of Systems  Constitutional: Positive for malaise/fatigue. Negative for fever and weight loss.  HENT: Positive for sore throat.   Respiratory: Positive for cough. Negative for hemoptysis and shortness of breath.   Cardiovascular: Negative.  Negative for chest pain and leg swelling.  Gastrointestinal: Negative.  Negative for abdominal pain.  Genitourinary: Negative.  Negative for dysuria.  Musculoskeletal: Negative.  Negative for back pain.  Skin: Negative.  Negative for rash.  Neurological: Positive for weakness. Negative for dizziness, focal weakness and headaches.  Psychiatric/Behavioral: Positive for memory loss. The patient is  not nervous/anxious.     As per HPI. Otherwise, a complete review of systems is negative.  PAST MEDICAL HISTORY: Past Medical History:  Diagnosis Date  . Anemia 09/01/2015  . Anxiety 09/11/2015  . Arthritis   . Asthma    as a teenager  . Asymptomatic Sinus Bradycardia   . Atypical chest pain    a. 08/2014  . BPH (benign prostatic hyperplasia)   . CKD (chronic kidney disease), stage III ([place])   . Closed fracture of tuft of distal phalanx of finger 08/31/2018  . Community acquired pneumonia 08/10/2020  . Coronary artery disease    a. 2000 s/p CABG;  b. 10/2008 Neg MV, EF 64%; c. cath 05/2015: LM 85% sev cal, pLAD-1 lesion 80%, pLAD-2 lesion 100% chronic, ostLCx 70% sev cal, OM4 90%, ostRCA 70%, mid RCA 90%, LIMA-LAD patent, VG-OM2 patent, VG-OM3 patent, VG-RPDA patent. No AS  . Dementia without behavioral disturbance ([place]) 05/28/2019  . GERD (gastroesophageal reflux disease)   . H/O echocardiogram    a. 10/2005 Echo: nl EF.  [name] Hairy cell leukemia ([place])   . HOH (hard of hearing)    Bilateral hearing aids  . HTN (hypertension)   . Hyperlipidemia   . Hypertension   . Leukemia ([place])   . Neutropenic fever ([place]) 11/08/2019  . Stricture and stenosis of esophagus     PAST SURGICAL HISTORY: Past Surgical History:  Procedure Laterality Date  . CARDIAC CATHETERIZATION N/A 05/26/2015   Procedure: Left Heart Cath;  Surgeon: [name], MD;  Location: [place] [facility];  Service: Cardiovascular;  Laterality: N/A;  . CARDIAC CATHETERIZATION N/A 05/26/2015   Procedure: Coronary/Graft Angiography;  Surgeon: [name], MD;  Location: [place] INVASIVE CV  LAB;  Service: Cardiovascular;  Laterality: N/A;  . CATARACT EXTRACTION    . CORONARY ARTERY BYPASS GRAFT  2000  . ESOPHAGOGASTRODUODENOSCOPY N/A 02/18/2017   Procedure: ESOPHAGOGASTRODUODENOSCOPY (EGD) with removal of food bolus;  Surgeon: [name], MD;  Location: [facility] ENDOSCOPY;  Service: Endoscopy;  Laterality: N/A;  .  ESOPHAGOGASTRODUODENOSCOPY (EGD) WITH PROPOFOL N/A 03/11/2017   Procedure: ESOPHAGOGASTRODUODENOSCOPY (EGD) WITH PROPOFOL;  Surgeon: [name], MD;  Location: [facility] ENDOSCOPY;  Service: Endoscopy;  Laterality: N/A;  . ESOPHAGOGASTRODUODENOSCOPY (EGD) WITH PROPOFOL N/A 04/15/2017   Procedure: ESOPHAGOGASTRODUODENOSCOPY (EGD) WITH PROPOFOL;  Surgeon: [name], MD;  Location: [facility] ENDOSCOPY;  Service: Endoscopy;  Laterality: N/A;  . EYE SURGERY Left    Cataract Extraction with IOL  . INGUINAL HERNIA REPAIR Left 06/24/2016   Procedure: HERNIA REPAIR INGUINAL ADULT;  Surgeon: [name], MD;  Location: [facility] ORS;  Service: General;  Laterality: Left;  . TONSILLECTOMY  1942   . UPPER GASTROINTESTINAL ENDOSCOPY  3 years ago    with Dilation.     FAMILY HISTORY Family History  Problem Relation Age of Onset  . Hypertension Mother   . Arthritis Father   . Other Other        no premature CAD.  [name] Colon cancer Neg Hx   . Prostate cancer Neg Hx   . Stomach cancer Neg Hx        ADVANCED DIRECTIVES:    HEALTH MAINTENANCE: Social History   Tobacco Use  . Smoking status: Never Smoker  . Smokeless tobacco: Never Used  Vaping Use  . Vaping Use: Never used  Substance Use Topics  . Alcohol use: No  . Drug use: No     No Known Allergies  Current Outpatient Medications  Medication Sig Dispense Refill  . atorvastatin (LIPITOR) 40 MG tablet TAKE 1/2 TABLETS (20 MG TOTAL) BY MOUTH DAILY. (Patient taking differently: Take 20 mg by mouth daily.) 45 tablet 3  . Calcium Carbonate Antacid (TUMS CHEWY BITES PO) Take 1 tablet by mouth as needed.     [name] CLINPRO 5000 1.1 % PSTE 1 application as directed. USE IN PLACE OF REGULAR TOOTHPASTE. USE PEASIZED AMOUNT, BRUSH DAILY AT LEAST 2 MINUTES. SPIT OUT.    [name] donepezil (ARICEPT) 10 MG tablet Take 1 tablet (10 mg total) by mouth at bedtime. 90 tablet 1  . finasteride (PROSCAR) 5 MG tablet TAKE 1 TABLET BY MOUTH EVERY DAY 30 tablet 1  . isosorbide  mononitrate (IMDUR) 30 MG 24 hr tablet TAKE 1 TABLET BY MOUTH EVERY DAY (Patient taking differently: Take 30 mg by mouth daily.) 90 tablet 3  . Multiple Vitamin (MULTIVITAMIN) tablet Take 1 tablet by mouth daily.    . ondansetron (ZOFRAN) 8 MG tablet Take 1 tablet (8 mg total) by mouth 2 (two) times daily as needed (Nausea or vomiting). 30 tablet 1  . pantoprazole (PROTONIX) 40 MG tablet Take 1 tablet (40 mg total) by mouth daily. 30 tablet 1  . prochlorperazine (COMPAZINE) 10 MG tablet Take 1 tablet (10 mg total) by mouth every 6 (six) hours as needed (Nausea or vomiting). 60 tablet 2  . tamsulosin (FLOMAX) 0.4 MG CAPS capsule TAKE 2 CAPSULES BY MOUTH EVERY DAY (Patient taking differently: Take 0.8 mg by mouth daily. TAKE 2 CAPSULES BY MOUTH EVERY DAY) 180 capsule 1   No current facility-administered medications for this visit.    OBJECTIVE: Vitals:   08/22/20 1036  BP: 136/63  Pulse: 63  Resp: 20  Temp: 98 F (36.7  C)  SpO2: 100%     Body mass index is 25.81 kg/m.    ECOG FS:1 - Symptomatic but completely ambulatory  General: Well-developed, well-nourished, no acute distress. Eyes: Pink conjunctiva, anicteric sclera. HEENT: Normocephalic, moist mucous membranes. Lungs: No audible wheezing or coughing. Heart: Regular rate and rhythm. Abdomen: Soft, nontender, no obvious distention. Musculoskeletal: No edema, cyanosis, or clubbing. Neuro: Alert, answering all questions appropriately. Cranial nerves grossly intact. Skin: No rashes or petechiae noted. Psych: Normal affect.  LAB RESULTS:  Lab Results  Component Value Date   NA 138 08/22/2020   K 3.4 (L) 08/22/2020   CL 106 08/22/2020   CO2 27 08/22/2020   GLUCOSE 101 (H) 08/22/2020   BUN 18 08/22/2020   CREATININE 1.12 08/22/2020   CALCIUM 8.8 (L) 08/22/2020   PROT 5.7 (L) 07/24/2020   ALBUMIN 2.9 (L) 07/24/2020   AST 24 07/24/2020   ALT 22 07/24/2020   ALKPHOS 50 07/24/2020   BILITOT 1.2 07/24/2020   GFRNONAA >60  08/22/2020   GFRAA 50 (L) 02/08/2020    Lab Results  Component Value Date   WBC 1.9 (L) 08/22/2020   NEUTROABS 1.6 (L) 08/22/2020   HGB 9.1 (L) 08/22/2020   HCT 26.6 (L) 08/22/2020   MCV 101.9 (H) 08/22/2020   PLT 90 (L) 08/22/2020     STUDIES: No results found.    ONCOLOGY HISTORY:  Patient initially received cladribine in 2006 and then was treated for a relapse in 2011 and again in 2014. Bone marrow biopsy at that time confirmed recurrence of disease with 80-90% hairy cell leukemia noted in his specimen. Patient completed cladribine 0.15 mg/kg on days 1 through 5 along with 8 weekly cycles of Rituxan on April 18, 2015. Bone marrow biopsy results after completion of 8 weeks of Rituxan without evidence of residual disease.  Patient typically has no evidence of disease and peripheral blood.   ASSESSMENT: Recurrent hairy cell leukemia.  PLAN:   1.  Recurrent hairy cell leukemia: See oncology history as above.  Bone marrow biopsy on November 02, 2019 revealed recurrent hairy cell leukemia, but percentage of involvement was not reported.  Patient received his cladribine from July 03, 2020 through July 07, 2020. He received cycle 2 of weekly Rituxan on July 10, 2020.  Treatment was then delayed several weeks secondary to hospital admission.  Proceed with cycle 6 of 8 of weekly Rituxan today.  Return to clinic in 1 week for further evaluation and consideration of cycle 7.   2. Thrombocytopenia: Chronic and unchanged.  Patient's platelet count is 90 today.  3.  Leukopenia: Chronic and unchanged.  [place] has trended down slightly to 1.6. 4.  Anemia: Chronic and unchanged.  Patient's hemoglobin is 9.1 today. 5.  Cardiac disease: Continue follow-up and treatment per Dr. Rockey Situ. 6.  Renal insufficiency: Resolved.  Patient creatinine is 1.12 today. 7.  Pneumonia: Patient has now completed antibiotics. 8.  Sore throat/cough: Improved.  Patient has completed nystatin.  Continue Protonix as  prescribed.  Continue follow-up with PCP. 9.  Weight loss: Improved.  Patient expressed understanding and was in agreement with this plan. He also understands that He can call clinic at any time with any questions, concerns, or complaints.    [name], MD 08/22/20 12:53 PM

## 2020-08-22 NOTE — Progress Notes (Signed)
Pt tolerated treatment well with no complications. Pt observed post infusion for 1 hour per MD order. VSS. Pt.'s son called for transportation. Pt stable for discharge.  Robinson Brinkley [employer]

## 2020-08-23 ENCOUNTER — Encounter: Payer: Self-pay | Admitting: Family Medicine

## 2020-08-26 NOTE — Progress Notes (Signed)
[place]  Telephone:([phone] [phone]  ID: DEXTON ZWILLING OB: 1933-03-31  MR#: [id number]  UXL#:[id number]  Patient Care Team: [name], MD as PCP - General (Family Medicine) Rockey Situ Kathlene November, MD as PCP - Cardiology (Cardiology) [name], MD as Consulting Physician (Cardiology) [name], MD as Consulting Physician (Oncology) [name] as Physician Assistant (Urology)  CHIEF COMPLAINT: Recurrent hairy cell leukemia.  INTERVAL HISTORY: Patient returns to clinic today for further evaluation and consideration of cycle 7 of 8 of weekly Rituxan.  He continues to have baseline confusion, but otherwise feels well.  He is tolerating his treatments without significant side effects. Many questions are answered by his son.  He continues to have chronic weakness and fatigue.  He has a fair appetite, but his weight is stable.  He does not complain of cough or hoarseness today.  He has no neurologic complaints.  He denies any fevers.  He denies any chest pain, shortness of breath, or hemoptysis.  He denies nausea, vomiting, diarrhea or constipation.  He has no urinary complaints.  Patient offers no further specific complaints today.  REVIEW OF SYSTEMS:   Review of Systems  Constitutional: Positive for malaise/fatigue. Negative for fever and weight loss.  HENT: Negative for sore throat.   Respiratory: Negative.  Negative for cough, hemoptysis and shortness of breath.   Cardiovascular: Negative.  Negative for chest pain and leg swelling.  Gastrointestinal: Negative.  Negative for abdominal pain.  Genitourinary: Negative.  Negative for dysuria.  Musculoskeletal: Negative.  Negative for back pain.  Skin: Negative.  Negative for rash.  Neurological: Positive for weakness. Negative for dizziness, focal weakness and headaches.  Psychiatric/Behavioral: Positive for memory loss. The patient is not nervous/anxious.     As per  HPI. Otherwise, a complete review of systems is negative.  PAST MEDICAL HISTORY: Past Medical History:  Diagnosis Date  . Anemia 09/01/2015  . Anxiety 09/11/2015  . Arthritis   . Asthma    as a teenager  . Asymptomatic Sinus Bradycardia   . Atypical chest pain    a. 08/2014  . BPH (benign prostatic hyperplasia)   . CKD (chronic kidney disease), stage III ([place])   . Closed fracture of tuft of distal phalanx of finger 08/31/2018  . Community acquired pneumonia 08/10/2020  . Coronary artery disease    a. 2000 s/p CABG;  b. 10/2008 Neg MV, EF 64%; c. cath 05/2015: LM 85% sev cal, pLAD-1 lesion 80%, pLAD-2 lesion 100% chronic, ostLCx 70% sev cal, OM4 90%, ostRCA 70%, mid RCA 90%, LIMA-LAD patent, VG-OM2 patent, VG-OM3 patent, VG-RPDA patent. No AS  . Dementia without behavioral disturbance ([place]) 05/28/2019  . GERD (gastroesophageal reflux disease)   . H/O echocardiogram    a. 10/2005 Echo: nl EF.  [name] Hairy cell leukemia ([place])   . HOH (hard of hearing)    Bilateral hearing aids  . HTN (hypertension)   . Hyperlipidemia   . Hypertension   . Leukemia ([place])   . Neutropenic fever ([place]) 11/08/2019  . Stricture and stenosis of esophagus     PAST SURGICAL HISTORY: Past Surgical History:  Procedure Laterality Date  . CARDIAC CATHETERIZATION N/A 05/26/2015   Procedure: Left Heart Cath;  Surgeon: [name], MD;  Location: [facility];  Service: Cardiovascular;  Laterality: N/A;  . CARDIAC CATHETERIZATION N/A 05/26/2015   Procedure: Coronary/Graft Angiography;  Surgeon: [name], MD;  Location: [facility];  Service:  Cardiovascular;  Laterality: N/A;  . CATARACT EXTRACTION    . CORONARY ARTERY BYPASS GRAFT  2000  . ESOPHAGOGASTRODUODENOSCOPY N/A 02/18/2017   Procedure: ESOPHAGOGASTRODUODENOSCOPY (EGD) with removal of food bolus;  Surgeon: [name], MD;  Location: [facility] ENDOSCOPY;  Service: Endoscopy;  Laterality: N/A;  . ESOPHAGOGASTRODUODENOSCOPY (EGD) WITH PROPOFOL  N/A 03/11/2017   Procedure: ESOPHAGOGASTRODUODENOSCOPY (EGD) WITH PROPOFOL;  Surgeon: [name], MD;  Location: [facility] ENDOSCOPY;  Service: Endoscopy;  Laterality: N/A;  . ESOPHAGOGASTRODUODENOSCOPY (EGD) WITH PROPOFOL N/A 04/15/2017   Procedure: ESOPHAGOGASTRODUODENOSCOPY (EGD) WITH PROPOFOL;  Surgeon: [name], MD;  Location: [facility] ENDOSCOPY;  Service: Endoscopy;  Laterality: N/A;  . EYE SURGERY Left    Cataract Extraction with IOL  . INGUINAL HERNIA REPAIR Left 06/24/2016   Procedure: HERNIA REPAIR INGUINAL ADULT;  Surgeon: [name], MD;  Location: [facility] ORS;  Service: General;  Laterality: Left;  . TONSILLECTOMY  1942   . UPPER GASTROINTESTINAL ENDOSCOPY  3 years ago    with Dilation.     FAMILY HISTORY Family History  Problem Relation Age of Onset  . Hypertension Mother   . Arthritis Father   . Other Other        no premature CAD.  [name] Colon cancer Neg Hx   . Prostate cancer Neg Hx   . Stomach cancer Neg Hx        ADVANCED DIRECTIVES:    HEALTH MAINTENANCE: Social History   Tobacco Use  . Smoking status: Never Smoker  . Smokeless tobacco: Never Used  Vaping Use  . Vaping Use: Never used  Substance Use Topics  . Alcohol use: No  . Drug use: No     No Known Allergies  Current Outpatient Medications  Medication Sig Dispense Refill  . atorvastatin (LIPITOR) 40 MG tablet TAKE 1/2 TABLETS (20 MG TOTAL) BY MOUTH DAILY. (Patient taking differently: Take 20 mg by mouth daily.) 45 tablet 3  . Calcium Carbonate Antacid (TUMS CHEWY BITES PO) Take 1 tablet by mouth as needed.     [name] CLINPRO 5000 1.1 % PSTE 1 application as directed. USE IN PLACE OF REGULAR TOOTHPASTE. USE PEASIZED AMOUNT, BRUSH DAILY AT LEAST 2 MINUTES. SPIT OUT.    [name] donepezil (ARICEPT) 10 MG tablet Take 1 tablet (10 mg total) by mouth at bedtime. 90 tablet 1  . finasteride (PROSCAR) 5 MG tablet TAKE 1 TABLET BY MOUTH EVERY DAY 30 tablet 1  . isosorbide mononitrate (IMDUR) 30 MG 24 hr tablet TAKE 1  TABLET BY MOUTH EVERY DAY (Patient taking differently: Take 30 mg by mouth daily.) 90 tablet 3  . Multiple Vitamin (MULTIVITAMIN) tablet Take 1 tablet by mouth daily.    . ondansetron (ZOFRAN) 8 MG tablet Take 1 tablet (8 mg total) by mouth 2 (two) times daily as needed (Nausea or vomiting). 30 tablet 1  . pantoprazole (PROTONIX) 40 MG tablet Take 1 tablet (40 mg total) by mouth daily. 30 tablet 1  . prochlorperazine (COMPAZINE) 10 MG tablet Take 1 tablet (10 mg total) by mouth every 6 (six) hours as needed (Nausea or vomiting). 60 tablet 2  . tamsulosin (FLOMAX) 0.4 MG CAPS capsule TAKE 2 CAPSULES BY MOUTH EVERY DAY (Patient taking differently: Take 0.8 mg by mouth daily. TAKE 2 CAPSULES BY MOUTH EVERY DAY) 180 capsule 1   No current facility-administered medications for this visit.    OBJECTIVE: Vitals:   08/29/20 0906  BP: 104/63  Pulse: (!) 58  Resp: 20  Temp: 97.8 F (36.6 C)  Body mass index is 25.81 kg/m.    ECOG FS:1 - Symptomatic but completely ambulatory  General: Well-developed, well-nourished, no acute distress. Eyes: Pink conjunctiva, anicteric sclera. HEENT: Normocephalic, moist mucous membranes. Lungs: No audible wheezing or coughing. Heart: Regular rate and rhythm. Abdomen: Soft, nontender, no obvious distention. Musculoskeletal: No edema, cyanosis, or clubbing. Neuro: Alert, answering all questions appropriately. Cranial nerves grossly intact. Skin: No rashes or petechiae noted. Psych: Normal affect.  LAB RESULTS:  Lab Results  Component Value Date   NA 137 08/29/2020   K 3.2 (L) 08/29/2020   CL 105 08/29/2020   CO2 26 08/29/2020   GLUCOSE 124 (H) 08/29/2020   BUN 18 08/29/2020   CREATININE 1.14 08/29/2020   CALCIUM 8.5 (L) 08/29/2020   PROT 5.7 (L) 07/24/2020   ALBUMIN 2.9 (L) 07/24/2020   AST 24 07/24/2020   ALT 22 07/24/2020   ALKPHOS 50 07/24/2020   BILITOT 1.2 07/24/2020   GFRNONAA >60 08/29/2020   GFRAA 50 (L) 02/08/2020    Lab Results   Component Value Date   WBC 3.1 (L) 08/29/2020   NEUTROABS 2.8 08/29/2020   HGB 8.7 (L) 08/29/2020   HCT 25.7 (L) 08/29/2020   MCV 104.9 (H) 08/29/2020   PLT 96 (L) 08/29/2020     STUDIES: No results found.    ONCOLOGY HISTORY:  Patient initially received cladribine in 2006 and then was treated for a relapse in 2011 and again in 2014. Bone marrow biopsy at that time confirmed recurrence of disease with 80-90% hairy cell leukemia noted in his specimen. Patient completed cladribine 0.15 mg/kg on days 1 through 5 along with 8 weekly cycles of Rituxan on April 18, 2015. Bone marrow biopsy results after completion of 8 weeks of Rituxan without evidence of residual disease.  Patient typically has no evidence of disease and peripheral blood.   ASSESSMENT: Recurrent hairy cell leukemia.  PLAN:   1.  Recurrent hairy cell leukemia: See oncology history as above.  Bone marrow biopsy on November 02, 2019 revealed recurrent hairy cell leukemia, but percentage of involvement was not reported.  Patient received his cladribine from July 03, 2020 through July 07, 2020. He received cycle 2 of weekly Rituxan on July 10, 2020.  Treatment was then delayed several weeks secondary to hospital admission.  Proceed with cycle 7 of weekly Rituxan today.  Return to clinic in 1 week for his eighth and final infusion. 2. Thrombocytopenia: Chronic and unchanged.  Patient platelet count is 96 today. 3.  Leukopenia: Improved.  Patient's ANC is 2.8 today. 4.  Anemia: Hemoglobin has trended down slightly to 8.7, monitor.  5.  Cardiac disease: Continue follow-up and treatment per Dr. Rockey Situ. 6.  Renal insufficiency: Resolved. 7.  Pneumonia: Patient has now completed antibiotics. 8.  Sore throat/cough: Resolved.  Continue Protonix as prescribed.  Continue follow-up with PCP. 9.  Weight loss: Improved.  Patient expressed understanding and was in agreement with this plan. He also understands that He can call  clinic at any time with any questions, concerns, or complaints.    [name], MD 08/29/20 9:45 AM

## 2020-08-29 ENCOUNTER — Inpatient Hospital Stay: Payer: PPO | Admitting: Oncology

## 2020-08-29 ENCOUNTER — Inpatient Hospital Stay: Payer: PPO

## 2020-08-29 ENCOUNTER — Encounter: Payer: Self-pay | Admitting: Oncology

## 2020-08-29 VITALS — BP 104/63 | HR 58 | Temp 97.8°F | Resp 20 | Wt 155.1 lb

## 2020-08-29 VITALS — BP 115/69 | HR 65 | Temp 97.5°F | Resp 18

## 2020-08-29 DIAGNOSIS — Z5112 Encounter for antineoplastic immunotherapy: Secondary | ICD-10-CM | POA: Diagnosis not present

## 2020-08-29 DIAGNOSIS — C9142 Hairy cell leukemia, in relapse: Secondary | ICD-10-CM | POA: Diagnosis not present

## 2020-08-29 DIAGNOSIS — D61818 Other pancytopenia: Secondary | ICD-10-CM

## 2020-08-29 LAB — CBC WITH DIFFERENTIAL/PLATELET
Abs Immature Granulocytes: 0.03 10*3/uL (ref 0.00–0.07)
Basophils Absolute: 0 10*3/uL (ref 0.0–0.1)
Basophils Relative: 0 %
Eosinophils Absolute: 0.1 10*3/uL (ref 0.0–0.5)
Eosinophils Relative: 2 %
HCT: 25.7 % — ABNORMAL LOW (ref 39.0–52.0)
Hemoglobin: 8.7 g/dL — ABNORMAL LOW (ref 13.0–17.0)
Immature Granulocytes: 1 %
Lymphocytes Relative: 4 %
Lymphs Abs: 0.1 10*3/uL — ABNORMAL LOW (ref 0.7–4.0)
MCH: 35.5 pg — ABNORMAL HIGH (ref 26.0–34.0)
MCHC: 33.9 g/dL (ref 30.0–36.0)
MCV: 104.9 fL — ABNORMAL HIGH (ref 80.0–100.0)
Monocytes Absolute: 0.1 10*3/uL (ref 0.1–1.0)
Monocytes Relative: 3 %
Neutro Abs: 2.8 10*3/uL (ref 1.7–7.7)
Neutrophils Relative %: 90 %
Platelets: 96 10*3/uL — ABNORMAL LOW (ref 150–400)
RBC: 2.45 MIL/uL — ABNORMAL LOW (ref 4.22–5.81)
RDW: 17.9 % — ABNORMAL HIGH (ref 11.5–15.5)
WBC: 3.1 10*3/uL — ABNORMAL LOW (ref 4.0–10.5)
nRBC: 0 % (ref 0.0–0.2)

## 2020-08-29 LAB — SAMPLE TO BLOOD BANK

## 2020-08-29 LAB — BASIC METABOLIC PANEL
Anion gap: 6 (ref 5–15)
BUN: 18 mg/dL (ref 8–23)
CO2: 26 mmol/L (ref 22–32)
Calcium: 8.5 mg/dL — ABNORMAL LOW (ref 8.9–10.3)
Chloride: 105 mmol/L (ref 98–111)
Creatinine, Ser: 1.14 mg/dL (ref 0.61–1.24)
GFR, Estimated: >60 mL/min (ref >60)
Glucose, Bld: 124 mg/dL — ABNORMAL HIGH (ref 70–99)
Potassium: 3.2 mmol/L — ABNORMAL LOW (ref 3.5–5.1)
Sodium: 137 mmol/L (ref 135–145)

## 2020-08-29 MED ORDER — SODIUM CHLORIDE 0.9 % IV SOLN
Freq: Once | INTRAVENOUS | Status: AC
  Filled 2020-08-29: qty 250

## 2020-08-29 MED ORDER — FAMOTIDINE IN NACL 20-0.9 MG/50ML-% IV SOLN
20.0000 mg | Freq: Once | INTRAVENOUS | Status: AC
  Administered 2020-08-29: 20 mg via INTRAVENOUS
  Filled 2020-08-29: qty 50

## 2020-08-29 MED ORDER — SODIUM CHLORIDE 0.9 % IV SOLN
10.0000 mg | Freq: Once | INTRAVENOUS | Status: AC
  Administered 2020-08-29: 10 mg via INTRAVENOUS
  Filled 2020-08-29: qty 10

## 2020-08-29 MED ORDER — SODIUM CHLORIDE 0.9 % IV SOLN
375.0000 mg/m2 | Freq: Once | INTRAVENOUS | Status: AC
  Administered 2020-08-29: 700 mg via INTRAVENOUS
  Filled 2020-08-29: qty 50

## 2020-08-29 MED ORDER — ACETAMINOPHEN 325 MG PO TABS
650.0000 mg | ORAL_TABLET | Freq: Once | ORAL | Status: AC
  Administered 2020-08-29: 650 mg via ORAL
  Filled 2020-08-29: qty 2

## 2020-08-29 MED ORDER — DIPHENHYDRAMINE HCL 25 MG PO CAPS
25.0000 mg | ORAL_CAPSULE | Freq: Once | ORAL | Status: AC
  Administered 2020-08-29: 25 mg via ORAL
  Filled 2020-08-29: qty 1

## 2020-08-29 NOTE — Progress Notes (Signed)
Patient denies any concerns today.  

## 2020-08-29 NOTE — Progress Notes (Signed)
Pt evaluated by MD prior to tx. Premeds given as ordered. Completed infusion without incident. Completed 1 hour observation. Pt discharged stable , with son to pick up. Return as scheduled.

## 2020-08-30 NOTE — Telephone Encounter (Signed)
[facility] called and wanted to know if we have received a Attending Physician statement has been received.. please contact  Jenn  at [phone]

## 2020-08-31 ENCOUNTER — Telehealth: Payer: Self-pay

## 2020-08-31 NOTE — Telephone Encounter (Signed)
Called [employer] and left a message for [place] explaining that Dr. Caryl Bis does have the form and is currently working on it.

## 2020-08-31 NOTE — Telephone Encounter (Signed)
Called and left a message for Jenn that Dr. Caryl Bis is currently working on the statement form.

## 2020-09-01 NOTE — Progress Notes (Signed)
[facility]  [phone] [phone]  ID: Frank Mcdaniel OB: 07/13/1933  MR#: [id number]  CSN#:[id number]  Patient Care Team: Sonnenberg, Eric G, MD as PCP - General (Family Medicine) Gollan,  J, MD as PCP - Cardiology (Cardiology) Gollan,  J, MD as Consulting Physician (Cardiology) ,  J, MD as Consulting Physician (Oncology) McGowan, Shannon A, PA-C as Physician Assistant (Urology)  CHIEF COMPLAINT: Recurrent hairy cell leukemia.  INTERVAL HISTORY: Patient returns to clinic today for further evaluation and consideration of cycle 8 of 8 of weekly Rituxan.  He continues to have baseline confusion, but otherwise feels well and is tolerating his treatment without significant side effects. Many questions are answered by his son.  He continues to have chronic weakness and fatigue.  He has a fair appetite, but his weight is stable.  He does not complain of cough or hoarseness today.  He has no neurologic complaints.  He denies any fevers.  He denies any chest pain, shortness of breath, or hemoptysis.  He denies nausea, vomiting, diarrhea or constipation.  He has no urinary complaints.  Patient offers no further specific complaints today.  REVIEW OF SYSTEMS:   Review of Systems  Constitutional: Positive for malaise/fatigue. Negative for fever and weight loss.  HENT: Negative for sore throat.   Respiratory: Negative.  Negative for cough, hemoptysis and shortness of breath.   Cardiovascular: Negative.  Negative for chest pain and leg swelling.  Gastrointestinal: Negative.  Negative for abdominal pain.  Genitourinary: Negative.  Negative for dysuria.  Musculoskeletal: Negative.  Negative for back pain.  Skin: Negative.  Negative for rash.  Neurological: Positive for weakness. Negative for dizziness, focal weakness and headaches.  Psychiatric/Behavioral: Positive for memory loss. The patient is not nervous/anxious.     As per  HPI. Otherwise, a complete review of systems is negative.  PAST MEDICAL HISTORY: Past Medical History:  Diagnosis Date  . Anemia 09/01/2015  . Anxiety 09/11/2015  . Arthritis   . Asthma    as a teenager  . Asymptomatic Sinus Bradycardia   . Atypical chest pain    a. 08/2014  . BPH (benign prostatic hyperplasia)   . CKD (chronic kidney disease), stage III ([facility])   . Closed fracture of tuft of distal phalanx of finger 08/31/2018  . Community acquired pneumonia 08/10/2020  . Coronary artery disease    a. 2000 s/p CABG;  b. 10/2008 Neg MV, EF 64%; c. cath 05/2015: LM 85% sev cal, pLAD-1 lesion 80%, pLAD-2 lesion 100% chronic, ostLCx 70% sev cal, OM4 90%, ostRCA 70%, mid RCA 90%, LIMA-LAD patent, VG-OM2 patent, VG-OM3 patent, VG-RPDA patent. No AS  . Dementia without behavioral disturbance ([facility]) 05/28/2019  . GERD (gastroesophageal reflux disease)   . H/O echocardiogram    a. 10/2005 Echo: nl EF.  . Hairy cell leukemia ([facility])   . HOH (hard of hearing)    Bilateral hearing aids  . HTN (hypertension)   . Hyperlipidemia   . Hypertension   . Leukemia ([facility])   . Neutropenic fever ([facility]) 11/08/2019  . Stricture and stenosis of esophagus     PAST SURGICAL HISTORY: Past Surgical History:  Procedure Laterality Date  . CARDIAC CATHETERIZATION N/A 05/26/2015   Procedure: Left Heart Cath;  Surgeon:  J Gollan, MD;  Location: [facility] INVASIVE CV LAB;  Service: Cardiovascular;  Laterality: N/A;  . CARDIAC CATHETERIZATION N/A 05/26/2015   Procedure: Coronary/Graft Angiography;  Surgeon:  J Gollan, MD;  Location: [facility];  Service: Cardiovascular;    Laterality: N/A;  . CATARACT EXTRACTION    . CORONARY ARTERY BYPASS GRAFT  2000  . ESOPHAGOGASTRODUODENOSCOPY N/A 02/18/2017   Procedure: ESOPHAGOGASTRODUODENOSCOPY (EGD) with removal of food bolus;  Surgeon: [name], MD;  Location: [facility] ENDOSCOPY;  Service: Endoscopy;  Laterality: N/A;  . ESOPHAGOGASTRODUODENOSCOPY (EGD) WITH PROPOFOL  N/A 03/11/2017   Procedure: ESOPHAGOGASTRODUODENOSCOPY (EGD) WITH PROPOFOL;  Surgeon: [name], MD;  Location: [facility] ENDOSCOPY;  Service: Endoscopy;  Laterality: N/A;  . ESOPHAGOGASTRODUODENOSCOPY (EGD) WITH PROPOFOL N/A 04/15/2017   Procedure: ESOPHAGOGASTRODUODENOSCOPY (EGD) WITH PROPOFOL;  Surgeon: [name], MD;  Location: [facility] ENDOSCOPY;  Service: Endoscopy;  Laterality: N/A;  . EYE SURGERY Left    Cataract Extraction with IOL  . INGUINAL HERNIA REPAIR Left 06/24/2016   Procedure: HERNIA REPAIR INGUINAL ADULT;  Surgeon: [name], MD;  Location: [facility] ORS;  Service: General;  Laterality: Left;  . TONSILLECTOMY  1942   . UPPER GASTROINTESTINAL ENDOSCOPY  3 years ago    with Dilation.     FAMILY HISTORY Family History  Problem Relation Age of Onset  . Hypertension Mother   . Arthritis Father   . Other Other        no premature CAD.  [name] Colon cancer Neg Hx   . Prostate cancer Neg Hx   . Stomach cancer Neg Hx        ADVANCED DIRECTIVES:    HEALTH MAINTENANCE: Social History   Tobacco Use  . Smoking status: Never Smoker  . Smokeless tobacco: Never Used  Vaping Use  . Vaping Use: Never used  Substance Use Topics  . Alcohol use: No  . Drug use: No     No Known Allergies  Current Outpatient Medications  Medication Sig Dispense Refill  . atorvastatin (LIPITOR) 40 MG tablet TAKE 1/2 TABLETS (20 MG TOTAL) BY MOUTH DAILY. (Patient taking differently: Take 20 mg by mouth daily.) 45 tablet 3  . Calcium Carbonate Antacid (TUMS CHEWY BITES PO) Take 1 tablet by mouth as needed.     [name] CLINPRO 5000 1.1 % PSTE 1 application as directed. USE IN PLACE OF REGULAR TOOTHPASTE. USE PEASIZED AMOUNT, BRUSH DAILY AT LEAST 2 MINUTES. SPIT OUT.    [name] donepezil (ARICEPT) 10 MG tablet Take 1 tablet (10 mg total) by mouth at bedtime. 90 tablet 1  . finasteride (PROSCAR) 5 MG tablet TAKE 1 TABLET BY MOUTH EVERY DAY 30 tablet 1  . isosorbide mononitrate (IMDUR) 30 MG 24 hr tablet TAKE 1  TABLET BY MOUTH EVERY DAY (Patient taking differently: Take 30 mg by mouth daily.) 90 tablet 3  . Multiple Vitamin (MULTIVITAMIN) tablet Take 1 tablet by mouth daily.    . ondansetron (ZOFRAN) 8 MG tablet Take 1 tablet (8 mg total) by mouth 2 (two) times daily as needed (Nausea or vomiting). 30 tablet 1  . pantoprazole (PROTONIX) 40 MG tablet Take 1 tablet (40 mg total) by mouth daily. 30 tablet 1  . prochlorperazine (COMPAZINE) 10 MG tablet Take 1 tablet (10 mg total) by mouth every 6 (six) hours as needed (Nausea or vomiting). 60 tablet 2  . tamsulosin (FLOMAX) 0.4 MG CAPS capsule TAKE 2 CAPSULES BY MOUTH EVERY DAY (Patient taking differently: Take 0.8 mg by mouth daily. TAKE 2 CAPSULES BY MOUTH EVERY DAY) 180 capsule 1   No current facility-administered medications for this visit.    OBJECTIVE: Vitals:   09/05/20 0932  BP: 136/60  Pulse: (!) 57  Resp: 18  Temp: (!) 97.3 F (36.3 C)  Body mass index is 25.39 kg/m.    ECOG FS:1 - Symptomatic but completely ambulatory  General: Well-developed, well-nourished, no acute distress. Eyes: Pink conjunctiva, anicteric sclera. HEENT: Normocephalic, moist mucous membranes. Lungs: No audible wheezing or coughing. Heart: Regular rate and rhythm. Abdomen: Soft, nontender, no obvious distention. Musculoskeletal: No edema, cyanosis, or clubbing. Neuro: Alert, answering all questions appropriately. Cranial nerves grossly intact. Skin: No rashes or petechiae noted. Psych: Normal affect.  LAB RESULTS:  Lab Results  Component Value Date   NA 138 09/05/2020   K 4.5 09/05/2020   CL 102 09/05/2020   CO2 27 09/05/2020   GLUCOSE 82 09/05/2020   BUN 17 09/05/2020   CREATININE 1.32 (H) 09/05/2020   CALCIUM 9.3 09/05/2020   PROT 5.7 (L) 07/24/2020   ALBUMIN 2.9 (L) 07/24/2020   AST 24 07/24/2020   ALT 22 07/24/2020   ALKPHOS 50 07/24/2020   BILITOT 1.2 07/24/2020   GFRNONAA 53 (L) 09/05/2020   GFRAA 50 (L) 02/08/2020    Lab Results   Component Value Date   WBC 2.3 (L) 09/05/2020   NEUTROABS 1.9 09/05/2020   HGB 9.3 (L) 09/05/2020   HCT 27.7 (L) 09/05/2020   MCV 106.1 (H) 09/05/2020   PLT 134 (L) 09/05/2020     STUDIES: No results found.    ONCOLOGY HISTORY:  Patient initially received cladribine in 2006 and then was treated for a relapse in 2011 and again in 2014. Bone marrow biopsy at that time confirmed recurrence of disease with 80-90% hairy cell leukemia noted in his specimen. Patient completed cladribine 0.15 mg/kg on days 1 through 5 along with 8 weekly cycles of Rituxan on April 18, 2015. Bone marrow biopsy results after completion of 8 weeks of Rituxan without evidence of residual disease.  Patient typically has no evidence of disease and peripheral blood.   ASSESSMENT: Recurrent hairy cell leukemia.  PLAN:   1.  Recurrent hairy cell leukemia: See oncology history as above.  Bone marrow biopsy on November 02, 2019 revealed recurrent hairy cell leukemia, but percentage of involvement was not reported.  Patient received his cladribine from July 03, 2020 through July 07, 2020. He received cycle 2 of weekly Rituxan on July 10, 2020.  Treatment was then delayed several weeks secondary to hospital admission.  Proceed with his eighth and final weekly infusion of Rituxan today.  Return to clinic in 1 month for repeat laboratory work and further evaluation. 2. Thrombocytopenia: Improved to 134.  Monitor. 3.  Leukopenia: Slightly decreased today patient's ANC is 1.9. 4.  Anemia: Hemoglobin improved to 9.3 today. 5.  Cardiac disease: Continue follow-up and treatment per Dr. Rockey Situ. 6.  Renal insufficiency: Mild, monitor. 7.  Pneumonia: Patient has now completed antibiotics. 8.  Sore throat/cough: Resolved.  Continue Protonix as prescribed.  Continue follow-up with PCP. 9.  Weight loss: Improved.  Patient expressed understanding and was in agreement with this plan. He also understands that He can call  clinic at any time with any questions, concerns, or complaints.    [name], MD 09/06/20 5:58 AM

## 2020-09-05 ENCOUNTER — Encounter: Payer: Self-pay | Admitting: Oncology

## 2020-09-05 ENCOUNTER — Inpatient Hospital Stay: Payer: PPO | Admitting: Oncology

## 2020-09-05 ENCOUNTER — Inpatient Hospital Stay: Payer: PPO

## 2020-09-05 ENCOUNTER — Inpatient Hospital Stay: Payer: PPO | Attending: Oncology

## 2020-09-05 VITALS — BP 116/73 | HR 67 | Resp 18

## 2020-09-05 VITALS — BP 136/60 | HR 57 | Temp 97.3°F | Resp 18 | Wt 152.6 lb

## 2020-09-05 DIAGNOSIS — M1990 Unspecified osteoarthritis, unspecified site: Secondary | ICD-10-CM | POA: Diagnosis not present

## 2020-09-05 DIAGNOSIS — F0390 Unspecified dementia without behavioral disturbance: Secondary | ICD-10-CM | POA: Insufficient documentation

## 2020-09-05 DIAGNOSIS — K219 Gastro-esophageal reflux disease without esophagitis: Secondary | ICD-10-CM | POA: Insufficient documentation

## 2020-09-05 DIAGNOSIS — Z5112 Encounter for antineoplastic immunotherapy: Secondary | ICD-10-CM | POA: Diagnosis not present

## 2020-09-05 DIAGNOSIS — E785 Hyperlipidemia, unspecified: Secondary | ICD-10-CM | POA: Insufficient documentation

## 2020-09-05 DIAGNOSIS — C9142 Hairy cell leukemia, in relapse: Secondary | ICD-10-CM

## 2020-09-05 DIAGNOSIS — R5381 Other malaise: Secondary | ICD-10-CM | POA: Insufficient documentation

## 2020-09-05 DIAGNOSIS — N400 Enlarged prostate without lower urinary tract symptoms: Secondary | ICD-10-CM | POA: Diagnosis not present

## 2020-09-05 DIAGNOSIS — I129 Hypertensive chronic kidney disease with stage 1 through stage 4 chronic kidney disease, or unspecified chronic kidney disease: Secondary | ICD-10-CM | POA: Insufficient documentation

## 2020-09-05 DIAGNOSIS — Z79899 Other long term (current) drug therapy: Secondary | ICD-10-CM | POA: Insufficient documentation

## 2020-09-05 DIAGNOSIS — I2510 Atherosclerotic heart disease of native coronary artery without angina pectoris: Secondary | ICD-10-CM | POA: Diagnosis not present

## 2020-09-05 DIAGNOSIS — R5382 Chronic fatigue, unspecified: Secondary | ICD-10-CM | POA: Insufficient documentation

## 2020-09-05 DIAGNOSIS — N183 Chronic kidney disease, stage 3 unspecified: Secondary | ICD-10-CM | POA: Diagnosis not present

## 2020-09-05 DIAGNOSIS — R531 Weakness: Secondary | ICD-10-CM | POA: Insufficient documentation

## 2020-09-05 DIAGNOSIS — Z951 Presence of aortocoronary bypass graft: Secondary | ICD-10-CM | POA: Diagnosis not present

## 2020-09-05 DIAGNOSIS — D649 Anemia, unspecified: Secondary | ICD-10-CM | POA: Diagnosis not present

## 2020-09-05 LAB — CBC WITH DIFFERENTIAL/PLATELET
Abs Immature Granulocytes: 0.02 10*3/uL (ref 0.00–0.07)
Basophils Absolute: 0 10*3/uL (ref 0.0–0.1)
Basophils Relative: 0 %
Eosinophils Absolute: 0.1 10*3/uL (ref 0.0–0.5)
Eosinophils Relative: 2 %
HCT: 27.7 % — ABNORMAL LOW (ref 39.0–52.0)
Hemoglobin: 9.3 g/dL — ABNORMAL LOW (ref 13.0–17.0)
Immature Granulocytes: 1 %
Lymphocytes Relative: 7 %
Lymphs Abs: 0.2 10*3/uL — ABNORMAL LOW (ref 0.7–4.0)
MCH: 35.6 pg — ABNORMAL HIGH (ref 26.0–34.0)
MCHC: 33.6 g/dL (ref 30.0–36.0)
MCV: 106.1 fL — ABNORMAL HIGH (ref 80.0–100.0)
Monocytes Absolute: 0.1 10*3/uL (ref 0.1–1.0)
Monocytes Relative: 4 %
Neutro Abs: 1.9 10*3/uL (ref 1.7–7.7)
Neutrophils Relative %: 86 %
Platelets: 134 10*3/uL — ABNORMAL LOW (ref 150–400)
RBC: 2.61 MIL/uL — ABNORMAL LOW (ref 4.22–5.81)
RDW: 17.1 % — ABNORMAL HIGH (ref 11.5–15.5)
WBC: 2.3 10*3/uL — ABNORMAL LOW (ref 4.0–10.5)
nRBC: 0 % (ref 0.0–0.2)

## 2020-09-05 LAB — BASIC METABOLIC PANEL
Anion gap: 9 (ref 5–15)
BUN: 17 mg/dL (ref 8–23)
CO2: 27 mmol/L (ref 22–32)
Calcium: 9.3 mg/dL (ref 8.9–10.3)
Chloride: 102 mmol/L (ref 98–111)
Creatinine, Ser: 1.32 mg/dL — ABNORMAL HIGH (ref 0.61–1.24)
GFR, Estimated: 53 mL/min — ABNORMAL LOW (ref >60)
Glucose, Bld: 82 mg/dL (ref 70–99)
Potassium: 4.5 mmol/L (ref 3.5–5.1)
Sodium: 138 mmol/L (ref 135–145)

## 2020-09-05 LAB — SAMPLE TO BLOOD BANK

## 2020-09-05 MED ORDER — SODIUM CHLORIDE 0.9 % IV SOLN
375.0000 mg/m2 | Freq: Once | INTRAVENOUS | Status: AC
  Administered 2020-09-05: 700 mg via INTRAVENOUS
  Filled 2020-09-05: qty 50

## 2020-09-05 MED ORDER — FAMOTIDINE IN NACL 20-0.9 MG/50ML-% IV SOLN
20.0000 mg | Freq: Once | INTRAVENOUS | Status: AC
  Administered 2020-09-05: 20 mg via INTRAVENOUS
  Filled 2020-09-05: qty 50

## 2020-09-05 MED ORDER — SODIUM CHLORIDE 0.9 % IV SOLN
Freq: Once | INTRAVENOUS | Status: AC
  Filled 2020-09-05: qty 250

## 2020-09-05 MED ORDER — SODIUM CHLORIDE 0.9 % IV SOLN
10.0000 mg | Freq: Once | INTRAVENOUS | Status: AC
  Administered 2020-09-05: 10 mg via INTRAVENOUS
  Filled 2020-09-05: qty 10

## 2020-09-05 MED ORDER — ACETAMINOPHEN 325 MG PO TABS
650.0000 mg | ORAL_TABLET | Freq: Once | ORAL | Status: AC
  Administered 2020-09-05: 650 mg via ORAL
  Filled 2020-09-05: qty 2

## 2020-09-05 MED ORDER — DIPHENHYDRAMINE HCL 25 MG PO CAPS
25.0000 mg | ORAL_CAPSULE | Freq: Once | ORAL | Status: AC
  Administered 2020-09-05: 25 mg via ORAL
  Filled 2020-09-05: qty 1

## 2020-09-05 NOTE — Progress Notes (Signed)
Mr. Frank Mcdaniel tolerated his treatment today without any complications. Patient monitored for one hour post infusion as ordered. Vital signs stable at discharge.

## 2020-09-05 NOTE — Progress Notes (Unsigned)
Pt and son in for follow up, pt denies any concerns today.

## 2020-09-07 ENCOUNTER — Encounter: Payer: Self-pay | Admitting: Family Medicine

## 2020-09-08 ENCOUNTER — Other Ambulatory Visit
Admission: RE | Admit: 2020-09-08 | Discharge: 2020-09-08 | Disposition: A | Discharge status: Home / Self Care | Payer: PPO | Source: Ambulatory Visit | Attending: Gastroenterology | Admitting: Gastroenterology

## 2020-09-08 ENCOUNTER — Other Ambulatory Visit: Payer: Self-pay

## 2020-09-08 DIAGNOSIS — Z20822 Contact with and (suspected) exposure to covid-19: Secondary | ICD-10-CM | POA: Diagnosis not present

## 2020-09-08 DIAGNOSIS — Z01812 Encounter for preprocedural laboratory examination: Secondary | ICD-10-CM | POA: Insufficient documentation

## 2020-09-09 LAB — SARS CORONAVIRUS 2 (TAT 6-24 HRS): SARS Coronavirus 2: NEGATIVE

## 2020-09-12 ENCOUNTER — Ambulatory Visit: Payer: PPO | Admitting: Certified Registered"

## 2020-09-12 ENCOUNTER — Encounter
Admission: RE | Disposition: A | Discharge status: Home / Self Care | Payer: Self-pay | Source: Home / Self Care | Attending: Gastroenterology

## 2020-09-12 ENCOUNTER — Encounter: Payer: Self-pay | Admitting: Gastroenterology

## 2020-09-12 ENCOUNTER — Ambulatory Visit
Admission: RE | Admit: 2020-09-12 | Discharge: 2020-09-12 | Disposition: A | Discharge status: Home / Self Care | Payer: PPO | Attending: Gastroenterology | Admitting: Gastroenterology

## 2020-09-12 ENCOUNTER — Inpatient Hospital Stay: Payer: PPO

## 2020-09-12 ENCOUNTER — Inpatient Hospital Stay: Payer: PPO | Admitting: Oncology

## 2020-09-12 DIAGNOSIS — Z951 Presence of aortocoronary bypass graft: Secondary | ICD-10-CM | POA: Insufficient documentation

## 2020-09-12 DIAGNOSIS — K222 Esophageal obstruction: Secondary | ICD-10-CM | POA: Diagnosis not present

## 2020-09-12 DIAGNOSIS — F0390 Unspecified dementia without behavioral disturbance: Secondary | ICD-10-CM | POA: Diagnosis not present

## 2020-09-12 DIAGNOSIS — Z8249 Family history of ischemic heart disease and other diseases of the circulatory system: Secondary | ICD-10-CM | POA: Insufficient documentation

## 2020-09-12 DIAGNOSIS — Z856 Personal history of leukemia: Secondary | ICD-10-CM | POA: Insufficient documentation

## 2020-09-12 DIAGNOSIS — Z79899 Other long term (current) drug therapy: Secondary | ICD-10-CM | POA: Diagnosis not present

## 2020-09-12 DIAGNOSIS — K2100 Gastro-esophageal reflux disease with esophagitis, without bleeding: Secondary | ICD-10-CM | POA: Insufficient documentation

## 2020-09-12 DIAGNOSIS — Z8261 Family history of arthritis: Secondary | ICD-10-CM | POA: Insufficient documentation

## 2020-09-12 DIAGNOSIS — K2289 Other specified disease of esophagus: Secondary | ICD-10-CM

## 2020-09-12 DIAGNOSIS — K219 Gastro-esophageal reflux disease without esophagitis: Secondary | ICD-10-CM | POA: Diagnosis not present

## 2020-09-12 DIAGNOSIS — K449 Diaphragmatic hernia without obstruction or gangrene: Secondary | ICD-10-CM | POA: Insufficient documentation

## 2020-09-12 DIAGNOSIS — N183 Chronic kidney disease, stage 3 unspecified: Secondary | ICD-10-CM | POA: Insufficient documentation

## 2020-09-12 DIAGNOSIS — K209 Esophagitis, unspecified without bleeding: Secondary | ICD-10-CM | POA: Diagnosis not present

## 2020-09-12 DIAGNOSIS — J45909 Unspecified asthma, uncomplicated: Secondary | ICD-10-CM | POA: Diagnosis not present

## 2020-09-12 DIAGNOSIS — E785 Hyperlipidemia, unspecified: Secondary | ICD-10-CM | POA: Diagnosis not present

## 2020-09-12 DIAGNOSIS — K589 Irritable bowel syndrome without diarrhea: Secondary | ICD-10-CM | POA: Diagnosis not present

## 2020-09-12 DIAGNOSIS — N390 Urinary tract infection, site not specified: Secondary | ICD-10-CM | POA: Diagnosis not present

## 2020-09-12 DIAGNOSIS — R1314 Dysphagia, pharyngoesophageal phase: Secondary | ICD-10-CM | POA: Diagnosis not present

## 2020-09-12 DIAGNOSIS — Q399 Congenital malformation of esophagus, unspecified: Secondary | ICD-10-CM | POA: Insufficient documentation

## 2020-09-12 DIAGNOSIS — I129 Hypertensive chronic kidney disease with stage 1 through stage 4 chronic kidney disease, or unspecified chronic kidney disease: Secondary | ICD-10-CM | POA: Insufficient documentation

## 2020-09-12 DIAGNOSIS — N400 Enlarged prostate without lower urinary tract symptoms: Secondary | ICD-10-CM | POA: Diagnosis not present

## 2020-09-12 HISTORY — PX: ESOPHAGOGASTRODUODENOSCOPY (EGD) WITH PROPOFOL: SHX5813

## 2020-09-12 SURGERY — ESOPHAGOGASTRODUODENOSCOPY (EGD) WITH PROPOFOL
Anesthesia: General

## 2020-09-12 MED ORDER — PHENYLEPHRINE HCL (PRESSORS) 10 MG/ML IV SOLN
INTRAVENOUS | Status: DC | PRN
  Administered 2020-09-12 (×3): 100 ug via INTRAVENOUS

## 2020-09-12 MED ORDER — SODIUM CHLORIDE 0.9 % IV SOLN: INTRAVENOUS | Status: DC

## 2020-09-12 MED ORDER — PROPOFOL 500 MG/50ML IV EMUL
INTRAVENOUS | Status: DC | PRN
  Administered 2020-09-12: 125 ug/kg/min via INTRAVENOUS

## 2020-09-12 MED ORDER — PROPOFOL 10 MG/ML IV BOLUS
INTRAVENOUS | Status: DC | PRN
  Administered 2020-09-12: 50 mg via INTRAVENOUS

## 2020-09-12 MED ORDER — LIDOCAINE HCL (CARDIAC) PF 100 MG/5ML IV SOSY
PREFILLED_SYRINGE | INTRAVENOUS | Status: DC | PRN
  Administered 2020-09-12: 50 mg via INTRAVENOUS

## 2020-09-12 NOTE — Anesthesia Preprocedure Evaluation (Signed)
Anesthesia Evaluation  Patient identified by MRN, date of birth, ID band Patient awake    Reviewed: Allergy & Precautions, H&P , NPO status , Patient's Chart, lab work & pertinent test results  History of Anesthesia Complications Negative for: history of anesthetic complications  Airway Mallampati: II  TM Distance: >3 FB     Dental  (+) Chipped   Pulmonary asthma , neg sleep apnea, neg COPD,    breath sounds clear to auscultation       Cardiovascular hypertension, (-) angina+ CAD and + CABG  (-) Past MI and (-) Cardiac Stents (-) dysrhythmias  Rhythm:regular Rate:Normal     Neuro/Psych PSYCHIATRIC DISORDERS Anxiety Dementia negative neurological ROS     GI/Hepatic Neg liver ROS, GERD  Controlled,  Endo/Other  negative endocrine ROS  Renal/GU Renal disease (CKD)  negative genitourinary   Musculoskeletal   Abdominal   Peds  Hematology negative hematology ROS (+)   Anesthesia Other Findings Past Medical History: 09/01/2015: Anemia 09/11/2015: Anxiety No date: Arthritis No date: Asthma     Comment:  as a teenager No date: Asymptomatic Sinus Bradycardia No date: Atypical chest pain     Comment:  a. 08/2014 No date: BPH (benign prostatic hyperplasia) No date: CKD (chronic kidney disease), stage III ([place]) 08/31/2018: Closed fracture of tuft of distal phalanx of finger 08/10/2020: Community acquired pneumonia No date: Coronary artery disease     Comment:  a. 2000 s/p CABG;  b. 10/2008 Neg MV, EF 64%; c. cath               05/2015: LM 85% sev cal, pLAD-1 lesion 80%, pLAD-2 lesion              100% chronic, ostLCx 70% sev cal, OM4 90%, ostRCA 70%,               mid RCA 90%, LIMA-LAD patent, VG-OM2 patent, VG-OM3               patent, VG-RPDA patent. No AS 05/28/2019: Dementia without behavioral disturbance (HCC) No date: GERD (gastroesophageal reflux disease) No date: H/O echocardiogram     Comment:  a. 10/2005 Echo: nl  EF. No date: Hairy cell leukemia ([place]) No date: HOH (hard of hearing)     Comment:  Bilateral hearing aids No date: HTN (hypertension) No date: Hyperlipidemia No date: Hypertension No date: Leukemia ([place]) 11/08/2019: Neutropenic fever ([place]) No date: Stricture and stenosis of esophagus  Past Surgical History: 05/26/2015: CARDIAC CATHETERIZATION; N/A     Comment:  Procedure: Left Heart Cath;  Surgeon: [name],               MD;  Location: [facility];  Service:               Cardiovascular;  Laterality: N/A; 05/26/2015: CARDIAC CATHETERIZATION; N/A     Comment:  Procedure: Coronary/Graft Angiography;  Surgeon: [name], MD;  Location: [facility];  Service:               Cardiovascular;  Laterality: N/A; No date: CATARACT EXTRACTION 2000: CORONARY ARTERY BYPASS GRAFT 02/18/2017: ESOPHAGOGASTRODUODENOSCOPY; N/A     Comment:  Procedure: ESOPHAGOGASTRODUODENOSCOPY (EGD) with removal              of food bolus;  Surgeon: [name], MD;  Location:  [facility];  Service: Endoscopy;  Laterality: N/A; 03/11/2017: ESOPHAGOGASTRODUODENOSCOPY (EGD) WITH PROPOFOL; N/A     Comment:  Procedure: ESOPHAGOGASTRODUODENOSCOPY (EGD) WITH               PROPOFOL;  Surgeon: [name], MD;  Location: [facility]               ENDOSCOPY;  Service: Endoscopy;  Laterality: N/A; 04/15/2017: ESOPHAGOGASTRODUODENOSCOPY (EGD) WITH PROPOFOL; N/A     Comment:  Procedure: ESOPHAGOGASTRODUODENOSCOPY (EGD) WITH               PROPOFOL;  Surgeon: [name], MD;  Location: [facility]               ENDOSCOPY;  Service: Endoscopy;  Laterality: N/A; No date: EYE SURGERY; Left     Comment:  Cataract Extraction with IOL 06/24/2016: INGUINAL HERNIA REPAIR; Left     Comment:  Procedure: HERNIA REPAIR INGUINAL ADULT;  Surgeon:               [name], MD;  Location: [facility] ORS;  Service:               General;  Laterality: Left; 1942 : TONSILLECTOMY 3 years ago :  UPPER GASTROINTESTINAL ENDOSCOPY     Comment:  with Dilation.   BMI    Body Mass Index: 27.46 kg/m      Reproductive/Obstetrics negative OB ROS                             Anesthesia Physical Anesthesia Plan  ASA: III  Anesthesia Plan: General   Post-op Pain Management:    Induction:   PONV Risk Score and Plan: Propofol infusion and TIVA  Airway Management Planned:   Additional Equipment:   Intra-op Plan:   Post-operative Plan:   Informed Consent: I have reviewed the patients History and Physical, chart, labs and discussed the procedure including the risks, benefits and alternatives for the proposed anesthesia with the patient or authorized representative who has indicated his/her understanding and acceptance.     Dental Advisory Given  Plan Discussed with: Anesthesiologist, CRNA and Surgeon  Anesthesia Plan Comments:         Anesthesia Quick Evaluation

## 2020-09-12 NOTE — Transfer of Care (Signed)
Immediate Anesthesia Transfer of Care Note  Patient: Frank Mcdaniel  Procedure(s) Performed: ESOPHAGOGASTRODUODENOSCOPY (EGD) WITH PROPOFOL (N/A )  Patient Location: Endoscopy Unit  Anesthesia Type:General  Level of Consciousness: awake, alert  and oriented  Airway & Oxygen Therapy: Patient Spontanous Breathing and Patient connected to nasal cannula oxygen  Post-op Assessment: Report given to RN and Post -op Vital signs reviewed and stable  Post vital signs: Reviewed and stable  Last Vitals:  Vitals Value Taken Time  BP    Temp    Pulse    Resp    SpO2      Last Pain:  Vitals:   09/12/20 0829  TempSrc: Temporal  PainSc: 0-No pain         Complications: No complications documented.

## 2020-09-12 NOTE — H&P (Signed)
[name], MD [address]  [place]  [place], [place] [address]  Main: [phone]  Fax: [phone] Pager: [phone]  Primary Care Physician:  [name], MD Primary Gastroenterologist:  Dr. [name]  Pre-Procedure History & Physical: HPI:  ARDA KEADLE is a 87 y.o. male is here for an endoscopy.   Past Medical History:  Diagnosis Date  . Anemia 09/01/2015  . Anxiety 09/11/2015  . Arthritis   . Asthma    as a teenager  . Asymptomatic Sinus Bradycardia   . Atypical chest pain    a. 08/2014  . BPH (benign prostatic hyperplasia)   . CKD (chronic kidney disease), stage III ([place])   . Closed fracture of tuft of distal phalanx of finger 08/31/2018  . Community acquired pneumonia 08/10/2020  . Coronary artery disease    a. 2000 s/p CABG;  b. 10/2008 Neg MV, EF 64%; c. cath 05/2015: LM 85% sev cal, pLAD-1 lesion 80%, pLAD-2 lesion 100% chronic, ostLCx 70% sev cal, OM4 90%, ostRCA 70%, mid RCA 90%, LIMA-LAD patent, VG-OM2 patent, VG-OM3 patent, VG-RPDA patent. No AS  . Dementia without behavioral disturbance ([place]) 05/28/2019  . GERD (gastroesophageal reflux disease)   . H/O echocardiogram    a. 10/2005 Echo: nl EF.  [name] Hairy cell leukemia ([place])   . HOH (hard of hearing)    Bilateral hearing aids  . HTN (hypertension)   . Hyperlipidemia   . Hypertension   . Leukemia ([place])   . Neutropenic fever ([place]) 11/08/2019  . Stricture and stenosis of esophagus     Past Surgical History:  Procedure Laterality Date  . CARDIAC CATHETERIZATION N/A 05/26/2015   Procedure: Left Heart Cath;  Surgeon: [name], MD;  Location: [facility];  Service: Cardiovascular;  Laterality: N/A;  . CARDIAC CATHETERIZATION N/A 05/26/2015   Procedure: Coronary/Graft Angiography;  Surgeon: [name], MD;  Location: [facility];  Service: Cardiovascular;  Laterality: N/A;  . CATARACT EXTRACTION    . CORONARY ARTERY BYPASS GRAFT  2000  .  ESOPHAGOGASTRODUODENOSCOPY N/A 02/18/2017   Procedure: ESOPHAGOGASTRODUODENOSCOPY (EGD) with removal of food bolus;  Surgeon: [name], MD;  Location: [facility] ENDOSCOPY;  Service: Endoscopy;  Laterality: N/A;  . ESOPHAGOGASTRODUODENOSCOPY (EGD) WITH PROPOFOL N/A 03/11/2017   Procedure: ESOPHAGOGASTRODUODENOSCOPY (EGD) WITH PROPOFOL;  Surgeon: [name], MD;  Location: [facility] ENDOSCOPY;  Service: Endoscopy;  Laterality: N/A;  . ESOPHAGOGASTRODUODENOSCOPY (EGD) WITH PROPOFOL N/A 04/15/2017   Procedure: ESOPHAGOGASTRODUODENOSCOPY (EGD) WITH PROPOFOL;  Surgeon: [name], MD;  Location: [facility] ENDOSCOPY;  Service: Endoscopy;  Laterality: N/A;  . EYE SURGERY Left    Cataract Extraction with IOL  . INGUINAL HERNIA REPAIR Left 06/24/2016   Procedure: HERNIA REPAIR INGUINAL ADULT;  Surgeon: [name], MD;  Location: [facility] ORS;  Service: General;  Laterality: Left;  . TONSILLECTOMY  1942   . UPPER GASTROINTESTINAL ENDOSCOPY  3 years ago    with Dilation.     Prior to Admission medications   Medication Sig Start Date End Date Taking? Authorizing Provider  atorvastatin (LIPITOR) 40 MG tablet TAKE 1/2 TABLETS (20 MG TOTAL) BY MOUTH DAILY. Patient taking differently: Take 20 mg by mouth daily. 10/26/19   [name], MD  Calcium Carbonate Antacid (TUMS CHEWY BITES PO) Take 1 tablet by mouth as needed.     [provider]  CLINPRO 5000 1.1 % PSTE 1 application as directed. USE IN PLACE OF REGULAR TOOTHPASTE. USE PEASIZED AMOUNT, BRUSH DAILY  AT LEAST 2 MINUTES. SPIT OUT. 04/11/19   [provider]  donepezil (ARICEPT) 10 MG tablet Take 1 tablet (10 mg total) by mouth at bedtime. 06/07/20   [name], MD  finasteride (PROSCAR) 5 MG tablet TAKE 1 TABLET BY MOUTH EVERY DAY 08/09/20   [name], MD  isosorbide mononitrate (IMDUR) 30 MG 24 hr tablet TAKE 1 TABLET BY MOUTH EVERY DAY Patient taking differently: Take 30 mg by mouth daily. 08/31/19   [name], MD   Multiple Vitamin (MULTIVITAMIN) tablet Take 1 tablet by mouth daily.    [provider]  ondansetron (ZOFRAN) 8 MG tablet Take 1 tablet (8 mg total) by mouth 2 (two) times daily as needed (Nausea or vomiting). 10/18/19   [name], MD  pantoprazole (PROTONIX) 40 MG tablet Take 1 tablet (40 mg total) by mouth daily. 07/24/20 09/22/20  Elodia Florence., MD  prochlorperazine (COMPAZINE) 10 MG tablet Take 1 tablet (10 mg total) by mouth every 6 (six) hours as needed (Nausea or vomiting). 10/18/19   [name], MD  tamsulosin (FLOMAX) 0.4 MG CAPS capsule TAKE 2 CAPSULES BY MOUTH EVERY DAY Patient taking differently: Take 0.8 mg by mouth daily. TAKE 2 CAPSULES BY MOUTH EVERY DAY 07/07/20   [name], MD    Allergies as of 08/17/2020  . (No Known Allergies)    Family History  Problem Relation Age of Onset  . Hypertension Mother   . Arthritis Father   . Other Other        no premature CAD.  [name] Colon cancer Neg Hx   . Prostate cancer Neg Hx   . Stomach cancer Neg Hx     Social History   Socioeconomic History  . Marital status: Widowed    Spouse name: Not on file  . Number of children: 1  . Years of education: Not on file  . Highest education level: Not on file  Occupational History  . Occupation: retired  Tobacco Use  . Smoking status: Never Smoker  . Smokeless tobacco: Never Used  Vaping Use  . Vaping Use: Never used  Substance and Sexual Activity  . Alcohol use: No  . Drug use: No  . Sexual activity: Never  Other Topics Concern  . Not on file  Social History Narrative   Lives locally with son.  Fairly active around the house though does not routinely exercise.  Retired from [employer].   Social Determinants of Health   Financial Resource Strain: Low Risk   . Difficulty of Paying Living Expenses: Not hard at all  Food Insecurity: No Food Insecurity  . Worried About Charity fundraiser in the Last Year: Never true  . Ran Out of Food in  the Last Year: Never true  Transportation Needs: No Transportation Needs  . Lack of Transportation (Medical): No  . Lack of Transportation (Non-Medical): No  Physical Activity: Unknown  . Days of Exercise per Week: 0 days  . Minutes of Exercise per Session: Not on file  Stress: No Stress Concern Present  . Feeling of Stress : Not at all  Social Connections: Not on file  Intimate Partner Violence: Not At Risk  . Fear of Current or Ex-Partner: No  . Emotionally Abused: No  . Physically Abused: No  . Sexually Abused: No    Review of Systems: See HPI, otherwise negative ROS  Physical Exam: BP 123/70   Pulse 61   Temp (!) 96.4  F (35.8 C) (Temporal)   Resp 17   Ht 5\' 3"  (1.6 m)   Wt 70.3 kg Comment: per son  SpO2 100%   BMI 27.46 kg/m  General:   Alert,  pleasant and cooperative in NAD Head:  Normocephalic and atraumatic. Neck:  Supple; no masses or thyromegaly. Lungs:  Clear throughout to auscultation.    Heart:  Regular rate and rhythm. Abdomen:  Soft, nontender and nondistended. Normal bowel sounds, without guarding, and without rebound.   Neurologic:  Alert and  oriented x4;  grossly normal neurologically.  Impression/Plan: LEALAND ELTING is here for an endoscopy to be performed for dysphagia  Risks, benefits, limitations, and alternatives regarding  endoscopy have been reviewed with the patient.  Questions have been answered.  All parties agreeable.   Sherri Sear, MD  09/12/2020, 9:08 AM

## 2020-09-12 NOTE — Op Note (Signed)
[facility] Gastroenterology Patient Name: Frank Mcdaniel Procedure Date: 09/12/2020 9:12 AM MRN: [id number] Account #: [id number] Date of Birth: [birth year] Admit Type: Outpatient Age: 87 Room: [facility] ENDO ROOM 3 Gender: Male Note Status: Finalized Procedure:             Upper GI endoscopy Indications:           Esophageal dysphagia, Abnormal CT of the GI tract Providers:             Lin Landsman MD, MD Referring MD:          Angela Adam. Caryl Bis (Referring MD) Medicines:             General Anesthesia Complications:         No immediate complications. Estimated blood loss: None. Procedure:             Pre-Anesthesia Assessment:                        - Prior to the procedure, a History and Physical was                         performed, and patient medications and allergies were                         reviewed. The patient is competent. The risks and                         benefits of the procedure and the sedation options and                         risks were discussed with the patient. All questions                         were answered and informed consent was obtained.                         Patient identification and proposed procedure were                         verified by the physician, the nurse, the                         anesthesiologist, the anesthetist and the technician                         in the pre-procedure area in the procedure room in the                         endoscopy suite. Mental Status Examination: alert and                         oriented. Airway Examination: normal oropharyngeal                         airway and neck mobility. Respiratory Examination:                         clear to auscultation. CV Examination: normal.  Prophylactic Antibiotics: The patient does not require                         prophylactic antibiotics. Prior Anticoagulants: The                         patient has taken no  previous anticoagulant or                         antiplatelet agents. ASA Grade Assessment: III - A                         patient with severe systemic disease. After reviewing                         the risks and benefits, the patient was deemed in                         satisfactory condition to undergo the procedure. The                         anesthesia plan was to use general anesthesia.                         Immediately prior to administration of medications,                         the patient was re-assessed for adequacy to receive                         sedatives. The heart rate, respiratory rate, oxygen                         saturations, blood pressure, adequacy of pulmonary                         ventilation, and response to care were monitored                         throughout the procedure. The physical status of the                         patient was re-assessed after the procedure.                        After obtaining informed consent, the endoscope was                         passed under direct vision. Throughout the procedure,                         the patient's blood pressure, pulse, and oxygen                         saturations were monitored continuously. The Endoscope                         was introduced through the mouth, and advanced to the  second part of duodenum. The upper GI endoscopy was                         accomplished without difficulty. The patient tolerated                         the procedure well. Findings:      The duodenal bulb and second portion of the duodenum were normal.      A medium-sized hiatal hernia was present.      The entire examined stomach was normal.      The cardia and gastric fundus were normal on retroflexion.      Esophagogastric landmarks were identified: the gastroesophageal junction       was found at 35 cm from the incisors.      One benign-appearing, intrinsic mild stenosis was  found 35 cm from the       incisors. This stenosis measured less than one cm (in length). The       stenosis was traversed. A TTS dilator was passed through the scope.       Dilation with a 15-16.5-18 mm balloon dilator was performed to 18 mm.      LA Grade B (one or more mucosal breaks greater than 5 mm, not extending       between the tops of two mucosal folds) esophagitis with no bleeding was       found in the lower third of the esophagus. Biopsies were taken with a       cold forceps for histology. Scarring of the lower esophageal mucosa,       biopsies performed,      The examined esophagus was moderately tortuous. Impression:            - Normal duodenal bulb and second portion of the                         duodenum.                        - Medium-sized hiatal hernia.                        - Normal stomach.                        - Esophagogastric landmarks identified.                        - Benign-appearing esophageal stenosis. Dilated.                        - LA Grade B reflux esophagitis with no bleeding.                         Biopsied.                        - Tortuous esophagus. Recommendation:        - Await pathology results.                        - Discharge patient to home (with escort).                        -  Resume previous diet today.                        - Continue present medications. Procedure Code(s):     --- Professional ---                        [id number], Esophagogastroduodenoscopy, flexible,                         transoral; with transendoscopic balloon dilation of                         esophagus (less than 30 mm diameter)                        43239, 59, Esophagogastroduodenoscopy, flexible,                         transoral; with biopsy, single or multiple Diagnosis Code(s):     --- Professional ---                        K44.9, Diaphragmatic hernia without obstruction or                         gangrene                        K22.2,  Esophageal obstruction                        K21.00, Gastro-esophageal reflux disease with                         esophagitis, without bleeding                        Q39.9, Congenital malformation of esophagus,                         unspecified                        R13.14, Dysphagia, pharyngoesophageal phase                        R93.3, Abnormal findings on diagnostic imaging of                         other parts of digestive tract CPT copyright 2019 American Medical Association. All rights reserved. The codes documented in this report are preliminary and upon coder review may  be revised to meet current compliance requirements. Dr. Ulyess Mort Lin Landsman MD, MD 09/12/2020 9:36:40 AM This report has been signed electronically. Number of Addenda: 0 Note Initiated On: 09/12/2020 9:12 AM Estimated Blood Loss:  Estimated blood loss: none.      [facility]

## 2020-09-13 ENCOUNTER — Encounter: Payer: Self-pay | Admitting: Gastroenterology

## 2020-09-13 ENCOUNTER — Encounter: Payer: Self-pay | Admitting: Family Medicine

## 2020-09-13 LAB — SURGICAL PATHOLOGY

## 2020-09-13 MED ORDER — PANTOPRAZOLE SODIUM 40 MG PO TBEC
40.0000 mg | DELAYED_RELEASE_TABLET | Freq: Every day | ORAL | 2 refills | Status: DC
Start: 2020-09-13 — End: 2020-11-14

## 2020-09-13 NOTE — Anesthesia Postprocedure Evaluation (Signed)
Anesthesia Post Note  Patient: Hilbert Corrigan  Procedure(s) Performed: ESOPHAGOGASTRODUODENOSCOPY (EGD) WITH PROPOFOL (N/A )  Patient location during evaluation: PACU Anesthesia Type: General Level of consciousness: awake and alert Pain management: pain level controlled Vital Signs Assessment: post-procedure vital signs reviewed and stable Respiratory status: spontaneous breathing, nonlabored ventilation and respiratory function stable Cardiovascular status: blood pressure returned to baseline and stable Postop Assessment: no apparent nausea or vomiting Anesthetic complications: no   No complications documented.   Last Vitals:  Vitals:   09/12/20 0940 09/12/20 1000  BP: (!) 95/43 112/64  Pulse: 62 (!) 58  Resp: (!) 28 20  Temp:    SpO2: 99% 100%    Last Pain:  Vitals:   09/13/20 0729  TempSrc:   PainSc: 0-No pain                 Tera Mater

## 2020-09-24 ENCOUNTER — Other Ambulatory Visit: Payer: Self-pay

## 2020-09-24 ENCOUNTER — Inpatient Hospital Stay
Admission: EM | Admit: 2020-09-24 | Discharge: 2020-09-26 | DRG: 871 | Disposition: A | Discharge status: Home / Self Care | Payer: PPO | Attending: Internal Medicine | Admitting: Internal Medicine

## 2020-09-24 ENCOUNTER — Emergency Department: Payer: PPO

## 2020-09-24 DIAGNOSIS — C9140 Hairy cell leukemia not having achieved remission: Secondary | ICD-10-CM | POA: Diagnosis not present

## 2020-09-24 DIAGNOSIS — Z951 Presence of aortocoronary bypass graft: Secondary | ICD-10-CM

## 2020-09-24 DIAGNOSIS — F419 Anxiety disorder, unspecified: Secondary | ICD-10-CM | POA: Diagnosis not present

## 2020-09-24 DIAGNOSIS — I2510 Atherosclerotic heart disease of native coronary artery without angina pectoris: Secondary | ICD-10-CM | POA: Diagnosis present

## 2020-09-24 DIAGNOSIS — C9141 Hairy cell leukemia, in remission: Secondary | ICD-10-CM | POA: Diagnosis not present

## 2020-09-24 DIAGNOSIS — K8080 Other cholelithiasis without obstruction: Secondary | ICD-10-CM | POA: Diagnosis not present

## 2020-09-24 DIAGNOSIS — R4182 Altered mental status, unspecified: Secondary | ICD-10-CM | POA: Diagnosis not present

## 2020-09-24 DIAGNOSIS — F0390 Unspecified dementia without behavioral disturbance: Secondary | ICD-10-CM | POA: Diagnosis present

## 2020-09-24 DIAGNOSIS — Z66 Do not resuscitate: Secondary | ICD-10-CM | POA: Diagnosis present

## 2020-09-24 DIAGNOSIS — R319 Hematuria, unspecified: Secondary | ICD-10-CM | POA: Diagnosis not present

## 2020-09-24 DIAGNOSIS — R410 Disorientation, unspecified: Secondary | ICD-10-CM | POA: Diagnosis not present

## 2020-09-24 DIAGNOSIS — J9601 Acute respiratory failure with hypoxia: Secondary | ICD-10-CM | POA: Diagnosis present

## 2020-09-24 DIAGNOSIS — E876 Hypokalemia: Secondary | ICD-10-CM | POA: Diagnosis not present

## 2020-09-24 DIAGNOSIS — K219 Gastro-esophageal reflux disease without esophagitis: Secondary | ICD-10-CM | POA: Diagnosis not present

## 2020-09-24 DIAGNOSIS — J189 Pneumonia, unspecified organism: Secondary | ICD-10-CM | POA: Diagnosis not present

## 2020-09-24 DIAGNOSIS — Z8261 Family history of arthritis: Secondary | ICD-10-CM

## 2020-09-24 DIAGNOSIS — N1831 Chronic kidney disease, stage 3a: Secondary | ICD-10-CM | POA: Diagnosis not present

## 2020-09-24 DIAGNOSIS — I4510 Unspecified right bundle-branch block: Secondary | ICD-10-CM | POA: Diagnosis present

## 2020-09-24 DIAGNOSIS — I1310 Hypertensive heart and chronic kidney disease without heart failure, with stage 1 through stage 4 chronic kidney disease, or unspecified chronic kidney disease: Secondary | ICD-10-CM | POA: Diagnosis not present

## 2020-09-24 DIAGNOSIS — R6520 Severe sepsis without septic shock: Secondary | ICD-10-CM | POA: Diagnosis present

## 2020-09-24 DIAGNOSIS — I248 Other forms of acute ischemic heart disease: Secondary | ICD-10-CM | POA: Diagnosis not present

## 2020-09-24 DIAGNOSIS — R404 Transient alteration of awareness: Secondary | ICD-10-CM | POA: Diagnosis not present

## 2020-09-24 DIAGNOSIS — J690 Pneumonitis due to inhalation of food and vomit: Secondary | ICD-10-CM | POA: Diagnosis present

## 2020-09-24 DIAGNOSIS — J984 Other disorders of lung: Secondary | ICD-10-CM | POA: Diagnosis not present

## 2020-09-24 DIAGNOSIS — I959 Hypotension, unspecified: Secondary | ICD-10-CM | POA: Diagnosis not present

## 2020-09-24 DIAGNOSIS — R0689 Other abnormalities of breathing: Secondary | ICD-10-CM | POA: Diagnosis not present

## 2020-09-24 DIAGNOSIS — K8020 Calculus of gallbladder without cholecystitis without obstruction: Secondary | ICD-10-CM | POA: Diagnosis not present

## 2020-09-24 DIAGNOSIS — N183 Chronic kidney disease, stage 3 unspecified: Secondary | ICD-10-CM | POA: Diagnosis present

## 2020-09-24 DIAGNOSIS — A419 Sepsis, unspecified organism: Principal | ICD-10-CM | POA: Diagnosis present

## 2020-09-24 DIAGNOSIS — E785 Hyperlipidemia, unspecified: Secondary | ICD-10-CM | POA: Diagnosis present

## 2020-09-24 DIAGNOSIS — Z20822 Contact with and (suspected) exposure to covid-19: Secondary | ICD-10-CM | POA: Diagnosis present

## 2020-09-24 DIAGNOSIS — R778 Other specified abnormalities of plasma proteins: Secondary | ICD-10-CM

## 2020-09-24 DIAGNOSIS — N400 Enlarged prostate without lower urinary tract symptoms: Secondary | ICD-10-CM | POA: Diagnosis not present

## 2020-09-24 DIAGNOSIS — R911 Solitary pulmonary nodule: Secondary | ICD-10-CM | POA: Diagnosis present

## 2020-09-24 DIAGNOSIS — Z8249 Family history of ischemic heart disease and other diseases of the circulatory system: Secondary | ICD-10-CM

## 2020-09-24 DIAGNOSIS — Z9221 Personal history of antineoplastic chemotherapy: Secondary | ICD-10-CM

## 2020-09-24 DIAGNOSIS — R9431 Abnormal electrocardiogram [ECG] [EKG]: Secondary | ICD-10-CM | POA: Diagnosis not present

## 2020-09-24 LAB — CBC WITH DIFFERENTIAL/PLATELET
Abs Immature Granulocytes: 0.02 10*3/uL (ref 0.00–0.07)
Basophils Absolute: 0 10*3/uL (ref 0.0–0.1)
Basophils Relative: 0 %
Eosinophils Absolute: 0 10*3/uL (ref 0.0–0.5)
Eosinophils Relative: 0 %
HCT: 29.3 % — ABNORMAL LOW (ref 39.0–52.0)
Hemoglobin: 10 g/dL — ABNORMAL LOW (ref 13.0–17.0)
Immature Granulocytes: 0 %
Lymphocytes Relative: 2 %
Lymphs Abs: 0.1 10*3/uL — ABNORMAL LOW (ref 0.7–4.0)
MCH: 36.2 pg — ABNORMAL HIGH (ref 26.0–34.0)
MCHC: 34.1 g/dL (ref 30.0–36.0)
MCV: 106.2 fL — ABNORMAL HIGH (ref 80.0–100.0)
Monocytes Absolute: 0.1 10*3/uL (ref 0.1–1.0)
Monocytes Relative: 3 %
Neutro Abs: 4.3 10*3/uL (ref 1.7–7.7)
Neutrophils Relative %: 95 %
Platelets: 120 10*3/uL — ABNORMAL LOW (ref 150–400)
RBC: 2.76 MIL/uL — ABNORMAL LOW (ref 4.22–5.81)
RDW: 14.5 % (ref 11.5–15.5)
WBC: 4.5 10*3/uL (ref 4.0–10.5)
nRBC: 0 % (ref 0.0–0.2)

## 2020-09-24 LAB — URINALYSIS, COMPLETE (UACMP) WITH MICROSCOPIC
Bacteria, UA: NONE SEEN
Bilirubin Urine: NEGATIVE
Glucose, UA: NEGATIVE mg/dL
Ketones, ur: NEGATIVE mg/dL
Leukocytes,Ua: NEGATIVE
Nitrite: NEGATIVE
Protein, ur: NEGATIVE mg/dL
RBC / HPF: >50 RBC/hpf — ABNORMAL HIGH (ref 0–5)
Specific Gravity, Urine: 1.017 (ref 1.005–1.030)
Squamous Epithelial / HPF: NONE SEEN (ref 0–5)
pH: 6 (ref 5.0–8.0)

## 2020-09-24 LAB — COMPREHENSIVE METABOLIC PANEL
ALT: 12 U/L (ref 0–44)
AST: 22 U/L (ref 15–41)
Albumin: 4.1 g/dL (ref 3.5–5.0)
Alkaline Phosphatase: 69 U/L (ref 38–126)
Anion gap: 9 (ref 5–15)
BUN: 18 mg/dL (ref 8–23)
CO2: 23 mmol/L (ref 22–32)
Calcium: 9 mg/dL (ref 8.9–10.3)
Chloride: 104 mmol/L (ref 98–111)
Creatinine, Ser: 1.34 mg/dL — ABNORMAL HIGH (ref 0.61–1.24)
GFR, Estimated: 52 mL/min — ABNORMAL LOW (ref >60)
Glucose, Bld: 117 mg/dL — ABNORMAL HIGH (ref 70–99)
Potassium: 3.5 mmol/L (ref 3.5–5.1)
Sodium: 136 mmol/L (ref 135–145)
Total Bilirubin: 1.4 mg/dL — ABNORMAL HIGH (ref 0.3–1.2)
Total Protein: 6.8 g/dL (ref 6.5–8.1)

## 2020-09-24 LAB — TROPONIN I (HIGH SENSITIVITY)
Troponin I (High Sensitivity): 19 ng/L — ABNORMAL HIGH (ref <18)
Troponin I (High Sensitivity): 21 ng/L — ABNORMAL HIGH (ref <18)

## 2020-09-24 LAB — PROTIME-INR
INR: 1.1 (ref 0.8–1.2)
Prothrombin Time: 13.9 seconds (ref 11.4–15.2)

## 2020-09-24 LAB — LACTIC ACID, PLASMA
Lactic Acid, Venous: 2.4 mmol/L (ref 0.5–1.9)
Lactic Acid, Venous: 1.4 mmol/L (ref 0.5–1.9)

## 2020-09-24 LAB — BRAIN NATRIURETIC PEPTIDE: B Natriuretic Peptide: 110.2 pg/mL — ABNORMAL HIGH (ref 0.0–100.0)

## 2020-09-24 LAB — RESP PANEL BY RT-PCR (FLU A&B, COVID) ARPGX2
Influenza A by PCR: NEGATIVE
Influenza B by PCR: NEGATIVE
SARS Coronavirus 2 by RT PCR: NEGATIVE

## 2020-09-24 LAB — LIPASE, BLOOD: Lipase: 59 U/L — ABNORMAL HIGH (ref 11–51)

## 2020-09-24 LAB — APTT: aPTT: 24 seconds (ref 24–36)

## 2020-09-24 LAB — POC SARS CORONAVIRUS 2 AG -  ED: SARS Coronavirus 2 Ag: NEGATIVE

## 2020-09-24 MED ORDER — ACETAMINOPHEN 325 MG PO TABS: 650.0000 mg | ORAL_TABLET | Freq: Four times a day (QID) | ORAL | Status: DC | PRN

## 2020-09-24 MED ORDER — FINASTERIDE 5 MG PO TABS
5.0000 mg | ORAL_TABLET | Freq: Every day | ORAL | Status: DC
  Administered 2020-09-25 – 2020-09-26 (×2): 5 mg via ORAL
  Filled 2020-09-24 (×3): qty 1

## 2020-09-24 MED ORDER — VANCOMYCIN HCL 1750 MG/350ML IV SOLN
1750.0000 mg | Freq: Once | INTRAVENOUS | Status: AC
  Administered 2020-09-24: 1750 mg via INTRAVENOUS
  Filled 2020-09-24: qty 350

## 2020-09-24 MED ORDER — ENOXAPARIN SODIUM 40 MG/0.4ML ~~LOC~~ SOLN
40.0000 mg | SUBCUTANEOUS | Status: DC
  Administered 2020-09-24 – 2020-09-25 (×2): 40 mg via SUBCUTANEOUS
  Filled 2020-09-24 (×2): qty 0.4

## 2020-09-24 MED ORDER — ONDANSETRON HCL 4 MG/2ML IJ SOLN: 4.0000 mg | Freq: Four times a day (QID) | INTRAMUSCULAR | Status: DC | PRN

## 2020-09-24 MED ORDER — SODIUM CHLORIDE 0.9 % IV SOLN
500.0000 mg | INTRAVENOUS | Status: DC
  Administered 2020-09-24 – 2020-09-25 (×2): 500 mg via INTRAVENOUS
  Filled 2020-09-24 (×2): qty 500

## 2020-09-24 MED ORDER — ACETAMINOPHEN 650 MG RE SUPP: 650.0000 mg | Freq: Four times a day (QID) | RECTAL | Status: DC | PRN

## 2020-09-24 MED ORDER — SODIUM CHLORIDE 0.9 % IV SOLN
2.0000 g | INTRAVENOUS | Status: DC
  Administered 2020-09-24 – 2020-09-25 (×2): 2 g via INTRAVENOUS
  Filled 2020-09-24 (×3): qty 20

## 2020-09-24 MED ORDER — SODIUM CHLORIDE 0.9 % IV SOLN
2.0000 g | Freq: Once | INTRAVENOUS | Status: AC
  Administered 2020-09-24: 2 g via INTRAVENOUS
  Filled 2020-09-24: qty 2

## 2020-09-24 MED ORDER — IOHEXOL 300 MG/ML  SOLN
100.0000 mL | Freq: Once | INTRAMUSCULAR | Status: AC | PRN
  Administered 2020-09-24: 100 mL via INTRAVENOUS

## 2020-09-24 MED ORDER — VANCOMYCIN HCL IN DEXTROSE 1-5 GM/200ML-% IV SOLN: 1000.0000 mg | Freq: Once | INTRAVENOUS | Status: DC

## 2020-09-24 MED ORDER — LACTATED RINGERS IV BOLUS (SEPSIS)
250.0000 mL | Freq: Once | INTRAVENOUS | Status: AC
  Administered 2020-09-24: 250 mL via INTRAVENOUS

## 2020-09-24 MED ORDER — LACTATED RINGERS IV BOLUS (SEPSIS)
1000.0000 mL | Freq: Once | INTRAVENOUS | Status: AC
  Administered 2020-09-24: 1000 mL via INTRAVENOUS

## 2020-09-24 MED ORDER — ISOSORBIDE MONONITRATE ER 30 MG PO TB24
30.0000 mg | ORAL_TABLET | Freq: Every day | ORAL | Status: DC
  Administered 2020-09-24 – 2020-09-26 (×3): 30 mg via ORAL
  Filled 2020-09-24 (×3): qty 1

## 2020-09-24 MED ORDER — PANTOPRAZOLE SODIUM 40 MG PO TBEC
40.0000 mg | DELAYED_RELEASE_TABLET | Freq: Every day | ORAL | Status: DC
Start: 2020-09-24 — End: 2020-09-26
  Administered 2020-09-24 – 2020-09-26 (×3): 40 mg via ORAL
  Filled 2020-09-24 (×3): qty 1

## 2020-09-24 MED ORDER — METRONIDAZOLE IN NACL 5-0.79 MG/ML-% IV SOLN
500.0000 mg | Freq: Once | INTRAVENOUS | Status: AC
  Administered 2020-09-24: 500 mg via INTRAVENOUS
  Filled 2020-09-24: qty 100

## 2020-09-24 MED ORDER — TAMSULOSIN HCL 0.4 MG PO CAPS
0.8000 mg | ORAL_CAPSULE | Freq: Every day | ORAL | Status: DC
  Administered 2020-09-24 – 2020-09-26 (×3): 0.8 mg via ORAL
  Filled 2020-09-24 (×3): qty 2

## 2020-09-24 MED ORDER — ADULT MULTIVITAMIN W/MINERALS CH
1.0000 | ORAL_TABLET | Freq: Every day | ORAL | Status: DC
  Administered 2020-09-24 – 2020-09-26 (×3): 1 via ORAL
  Filled 2020-09-24 (×3): qty 1

## 2020-09-24 MED ORDER — ATORVASTATIN CALCIUM 20 MG PO TABS
20.0000 mg | ORAL_TABLET | Freq: Every day | ORAL | Status: DC
  Administered 2020-09-24 – 2020-09-26 (×3): 20 mg via ORAL
  Filled 2020-09-24 (×3): qty 1

## 2020-09-24 MED ORDER — ONDANSETRON HCL 4 MG PO TABS: 4.0000 mg | ORAL_TABLET | Freq: Four times a day (QID) | ORAL | Status: DC | PRN

## 2020-09-24 MED ORDER — LACTATED RINGERS IV SOLN: INTRAVENOUS | Status: DC

## 2020-09-24 MED ORDER — DONEPEZIL HCL 5 MG PO TABS
10.0000 mg | ORAL_TABLET | Freq: Every day | ORAL | Status: DC
  Administered 2020-09-24 – 2020-09-25 (×2): 10 mg via ORAL
  Filled 2020-09-24 (×4): qty 2

## 2020-09-24 NOTE — Progress Notes (Signed)
Code Sepsis initiated @ 9629 AM, [place] following.

## 2020-09-24 NOTE — H&P (Signed)
History and Physical    Frank Mcdaniel:[id number] DOB: 1933/02/22 DOA: 09/24/2020  PCP: [name], MD   Patient coming from: Home  I have personally briefly reviewed patient's old medical records in [place] Most of the history was obtained from patient's son at the bedside. Chief Complaint: Fever                                Change in mental status  HPI: Frank Mcdaniel is a 87 y.o. male with medical history significant for dementia, anxiety, GERD, hypertension, history of recurrent hairy cell leukemia who  recently completed chemotherapy on 09/05/20 was brought into the ER by EMS after his son called for evaluation of worsening confusion.  Patient has dementia with some baseline confusion but on the day of his admission his son said he had difficulty speaking and not making any sense which is unusual for him.  This concerned his son who thought he may be having a TIA or stroke and so he called EMS.  When EMS arrived patient was found to have a fever with a temp of 101 F.  He was also hypoxic by EMS with pulse oximetry in the 70s and was placed on a nonrebreather mask. According to the son he has been in his usual state of health without any issues and had a recent upper endoscopy on 02/08 which showed esophagitis with no bleeding, medium size hiatal hernia and a mild 1 cm stenosis that was traversed during the procedure.  His son states that he has had no choking episodes and does not have any difficulty swallowing that he is aware of. Patient is fully vaccinated against the COVID-19 virus I am unable to do a review of systems on this patient due to his mental status changes. Labs show sodium 136, potassium 3.5, chloride 104, bicarb 23, glucose 117, BUN 18, creatinine 1.34, calcium 9.0, alkaline phosphatase 69, albumin 4.1, lipase 59, AST 22, ALT 12, total protein 6.8, BNP 110, lactic acid 2.4 >> 1.4, white count 4.5, hemoglobin 10.0, hematocrit 29.3, MCV 106, RDW 14.5,  platelet count 120, PT 13.9, INR 1.1 Point-of-care SARS-CoV-2 virus test is negative Chest x-ray reviewed by me shows hazy density on the right which could reflect early pneumonia in this setting. Low volume chest. CT scan of chest, abdomen and pelvis shows  Patchy ground-glass airspace disease in the right upper, middle, and lower lobes, somewhat obscured by breathing motion. Imaging features compatible with multifocal pneumonia and atypical etiologywould be a consideration. Mild collapse/consolidative opacity is noted both posterior lung bases. 2. 7 mm posterior right upper lobe pulmonary nodule. Non-contrast chest CT at 6-12 months is recommended. If the nodule is stable at time of repeat CT, then future CT at 18-24 months (from today's scan) is considered optional for low-risk patients, but is recommended for high-risk patients. Twelve-lead EKG reviewed by me shows sinus rhythm, right bundle branch block and LVH.   ED Course: Patient is an 87 year old Caucasian male who was brought into the ER by EMS for evaluation of mental status changes and noted to have a fever with a T-max of 101 and hypoxia requiring oxygen supplementation.  Patient was tachycardic and tachypneic with an elevated lactic acid level of 2.4.  Imaging is suggestive of multifocal pneumonia.  Patient's point-of-care SARS-CoV-2 virus PCR test is negative and PCR is pending but he is fully vaccinated.  He will be  admitted to the hospital for further evaluation and treatment.   Review of Systems: As per HPI otherwise all other systems reviewed and negative.    Past Medical History:  Diagnosis Date  . Anemia 09/01/2015  . Anxiety 09/11/2015  . Arthritis   . Asthma    as a teenager  . Asymptomatic Sinus Bradycardia   . Atypical chest pain    a. 08/2014  . BPH (benign prostatic hyperplasia)   . CKD (chronic kidney disease), stage III ([place])   . Closed fracture of tuft of distal phalanx of finger 08/31/2018  . Community acquired  pneumonia 08/10/2020  . Coronary artery disease    a. 2000 s/p CABG;  b. 10/2008 Neg MV, EF 64%; c. cath 05/2015: LM 85% sev cal, pLAD-1 lesion 80%, pLAD-2 lesion 100% chronic, ostLCx 70% sev cal, OM4 90%, ostRCA 70%, mid RCA 90%, LIMA-LAD patent, VG-OM2 patent, VG-OM3 patent, VG-RPDA patent. No AS  . Dementia without behavioral disturbance ([place]) 05/28/2019  . GERD (gastroesophageal reflux disease)   . H/O echocardiogram    a. 10/2005 Echo: nl EF.  [name] Hairy cell leukemia ([place])   . HOH (hard of hearing)    Bilateral hearing aids  . HTN (hypertension)   . Hyperlipidemia   . Hypertension   . Leukemia ([place])   . Neutropenic fever ([place]) 11/08/2019  . Stricture and stenosis of esophagus     Past Surgical History:  Procedure Laterality Date  . CARDIAC CATHETERIZATION N/A 05/26/2015   Procedure: Left Heart Cath;  Surgeon: [name], MD;  Location: [facility];  Service: Cardiovascular;  Laterality: N/A;  . CARDIAC CATHETERIZATION N/A 05/26/2015   Procedure: Coronary/Graft Angiography;  Surgeon: [name], MD;  Location: [facility];  Service: Cardiovascular;  Laterality: N/A;  . CATARACT EXTRACTION    . CORONARY ARTERY BYPASS GRAFT  2000  . ESOPHAGOGASTRODUODENOSCOPY N/A 02/18/2017   Procedure: ESOPHAGOGASTRODUODENOSCOPY (EGD) with removal of food bolus;  Surgeon: [name], MD;  Location: [facility] ENDOSCOPY;  Service: Endoscopy;  Laterality: N/A;  . ESOPHAGOGASTRODUODENOSCOPY (EGD) WITH PROPOFOL N/A 03/11/2017   Procedure: ESOPHAGOGASTRODUODENOSCOPY (EGD) WITH PROPOFOL;  Surgeon: [name], MD;  Location: [facility] ENDOSCOPY;  Service: Endoscopy;  Laterality: N/A;  . ESOPHAGOGASTRODUODENOSCOPY (EGD) WITH PROPOFOL N/A 04/15/2017   Procedure: ESOPHAGOGASTRODUODENOSCOPY (EGD) WITH PROPOFOL;  Surgeon: [name], MD;  Location: [facility] ENDOSCOPY;  Service: Endoscopy;  Laterality: N/A;  . ESOPHAGOGASTRODUODENOSCOPY (EGD) WITH PROPOFOL N/A 09/12/2020   Procedure:  ESOPHAGOGASTRODUODENOSCOPY (EGD) WITH PROPOFOL;  Surgeon: Lin Landsman, MD;  Location: [facility] ENDOSCOPY;  Service: Gastroenterology;  Laterality: N/A;  . EYE SURGERY Left    Cataract Extraction with IOL  . INGUINAL HERNIA REPAIR Left 06/24/2016   Procedure: HERNIA REPAIR INGUINAL ADULT;  Surgeon: [name], MD;  Location: [facility] ORS;  Service: General;  Laterality: Left;  . TONSILLECTOMY  1942   . UPPER GASTROINTESTINAL ENDOSCOPY  3 years ago    with Dilation.      reports that he has never smoked. He has never used smokeless tobacco. He reports that he does not drink alcohol and does not use drugs.  No Known Allergies  Family History  Problem Relation Age of Onset  . Hypertension Mother   . Arthritis Father   . Other Other        no premature CAD.  [name] Colon cancer Neg Hx   . Prostate cancer Neg Hx   . Stomach cancer Neg Hx       Prior to  Admission medications   Medication Sig Start Date End Date Taking? Authorizing Provider  atorvastatin (LIPITOR) 40 MG tablet TAKE 1/2 TABLETS (20 MG TOTAL) BY MOUTH DAILY. Patient taking differently: Take 20 mg by mouth daily. 10/26/19   [name], MD  Calcium Carbonate Antacid (TUMS CHEWY BITES PO) Take 1 tablet by mouth as needed.     [provider]  CLINPRO 5000 1.1 % PSTE 1 application as directed. USE IN PLACE OF REGULAR TOOTHPASTE. USE PEASIZED AMOUNT, BRUSH DAILY AT LEAST 2 MINUTES. SPIT OUT. 04/11/19   [provider]  donepezil (ARICEPT) 10 MG tablet Take 1 tablet (10 mg total) by mouth at bedtime. 06/07/20   [name], MD  finasteride (PROSCAR) 5 MG tablet TAKE 1 TABLET BY MOUTH EVERY DAY 08/09/20   [name], MD  isosorbide mononitrate (IMDUR) 30 MG 24 hr tablet TAKE 1 TABLET BY MOUTH EVERY DAY Patient taking differently: Take 30 mg by mouth daily. 08/31/19   [name], MD  Multiple Vitamin (MULTIVITAMIN) tablet Take 1 tablet by mouth daily.    [provider]   ondansetron (ZOFRAN) 8 MG tablet Take 1 tablet (8 mg total) by mouth 2 (two) times daily as needed (Nausea or vomiting). 10/18/19   [name], MD  pantoprazole (PROTONIX) 40 MG tablet Take 1 tablet (40 mg total) by mouth daily. 09/13/20 11/12/20  [name], MD  prochlorperazine (COMPAZINE) 10 MG tablet Take 1 tablet (10 mg total) by mouth every 6 (six) hours as needed (Nausea or vomiting). 10/18/19   [name], MD  tamsulosin (FLOMAX) 0.4 MG CAPS capsule TAKE 2 CAPSULES BY MOUTH EVERY DAY Patient taking differently: Take 0.8 mg by mouth daily. TAKE 2 CAPSULES BY MOUTH EVERY DAY 07/07/20   [name], MD    Physical Exam: Vitals:   09/24/20 0830 09/24/20 0930 09/24/20 1000 09/24/20 1100  BP: (!) 112/40 (!) 116/91 (!) 116/50 (!) 110/49  Pulse: 88 74 61 (!) 58  Resp: (!) 32 (!) 24 (!) 24 (!) 26  Temp:      TempSrc:      SpO2: 100% 93% 98% 98%  Weight:      Height:         Vitals:   09/24/20 0830 09/24/20 0930 09/24/20 1000 09/24/20 1100  BP: (!) 112/40 (!) 116/91 (!) 116/50 (!) 110/49  Pulse: 88 74 61 (!) 58  Resp: (!) 32 (!) 24 (!) 24 (!) 26  Temp:      TempSrc:      SpO2: 100% 93% 98% 98%  Weight:      Height:          Constitutional:  Sleeping but arouses to verbal stimuli.  Oriented to person and place but not to time.  Not in any apparent distress HEENT:      Head: Normocephalic and atraumatic.         Eyes: PERLA, EOMI, Conjunctivae are normal. Sclera is non-icteric.       Mouth/Throat: Mucous membranes are moist.       Neck: Supple with no signs of meningismus. Cardiovascular: Regular rate and rhythm. No murmurs, gallops, or rubs. 2+ symmetrical distal pulses are present . No JVD. No  LE edema Respiratory:  Bilateral air entry, scattered rhonchi bilaterally, no wheezes or crackles. Gastrointestinal: Soft, non tender, and non distended with positive bowel sounds.  Genitourinary: No CVA tenderness. Musculoskeletal: Nontender with normal  range of motion in all extremities. No cyanosis,  or erythema of extremities. Neurologic:  Face is symmetric. Moving all extremities. No gross focal neurologic deficits  Skin: Skin is warm, dry.  No rash or ulcers Psychiatric: Mood and affect are normal   Labs on Admission: I have personally reviewed following labs and imaging studies  CBC: Recent Labs  Lab 09/24/20 0542  WBC 4.5  NEUTROABS 4.3  HGB 10.0*  HCT 29.3*  MCV 106.2*  PLT 665*   Basic Metabolic Panel: Recent Labs  Lab 09/24/20 0542  NA 136  K 3.5  CL 104  CO2 23  GLUCOSE 117*  BUN 18  CREATININE 1.34*  CALCIUM 9.0   GFR: Estimated Creatinine Clearance: 35 mL/min (A) (by C-G formula based on SCr of 1.34 mg/dL (H)). Liver Function Tests: Recent Labs  Lab 09/24/20 0542  AST 22  ALT 12  ALKPHOS 69  BILITOT 1.4*  PROT 6.8  ALBUMIN 4.1   Recent Labs  Lab 09/24/20 0542  LIPASE 59*   No results for input(s): AMMONIA in the last 168 hours. Coagulation Profile: Recent Labs  Lab 09/24/20 0542  INR 1.1   Cardiac Enzymes: No results for input(s): CKTOTAL, CKMB, CKMBINDEX, TROPONINI in the last 168 hours. BNP (last 3 results) No results for input(s): PROBNP in the last 8760 hours. HbA1C: No results for input(s): HGBA1C in the last 72 hours. CBG: No results for input(s): GLUCAP in the last 168 hours. Lipid Profile: No results for input(s): CHOL, HDL, LDLCALC, TRIG, CHOLHDL, LDLDIRECT in the last 72 hours. Thyroid Function Tests: No results for input(s): TSH, T4TOTAL, FREET4, T3FREE, THYROIDAB in the last 72 hours. Anemia Panel: No results for input(s): VITAMINB12, FOLATE, FERRITIN, TIBC, IRON, RETICCTPCT in the last 72 hours. Urine analysis:    Component Value Date/Time   COLORURINE YELLOW (A) 09/24/2020 0542   APPEARANCEUR HAZY (A) 09/24/2020 0542   APPEARANCEUR Clear 07/16/2018 1452   LABSPEC 1.017 09/24/2020 0542   LABSPEC 1.013 09/01/2014 1142   PHURINE 6.0 09/24/2020 0542   GLUCOSEU  NEGATIVE 09/24/2020 0542   GLUCOSEU Negative 09/01/2014 1142   HGBUR LARGE (A) 09/24/2020 0542   BILIRUBINUR NEGATIVE 09/24/2020 0542   BILIRUBINUR Negative 07/16/2018 1452   BILIRUBINUR Negative 09/01/2014 1142   KETONESUR NEGATIVE 09/24/2020 0542   PROTEINUR NEGATIVE 09/24/2020 0542   UROBILINOGEN 0.2 12/21/2015 1132   NITRITE NEGATIVE 09/24/2020 0542   LEUKOCYTESUR NEGATIVE 09/24/2020 0542   LEUKOCYTESUR Negative 09/01/2014 1142    Radiological Exams on Admission: CT CHEST ABDOMEN PELVIS W CONTRAST  Result Date: 09/24/2020 CLINICAL DATA:  Sepsis.  Question pneumonia or urinary obstruction. EXAM: CT CHEST, ABDOMEN, AND PELVIS WITH CONTRAST TECHNIQUE: Multidetector CT imaging of the chest, abdomen and pelvis was performed following the standard protocol during bolus administration of intravenous contrast. CONTRAST:  134mL OMNIPAQUE IOHEXOL 300 MG/ML  SOLN COMPARISON:  CTA chest 11/08/2019. FINDINGS: CT CHEST FINDINGS Cardiovascular: The heart size is normal. No substantial pericardial effusion. Coronary artery calcification is evident. Atherosclerotic calcification is noted in the wall of the thoracic aorta. Mediastinum/Nodes: No mediastinal lymphadenopathy. No evidence for gross hilar lymphadenopathy although assessment is limited by the lack of intravenous contrast on today's study. Patulous esophagus. There is no axillary lymphadenopathy. Lungs/Pleura: 7 mm nodule identified posterior right upper lobe on 24/4. Calcified granuloma seen in the right upper lobe. There is patchy ground-glass airspace disease in the right upper, middle, and lower lobes, somewhat obscured by breathing motion during image acquisition. Mild collapse/consolidative opacity is noted both posterior lung bases. No pleural effusion. Musculoskeletal: No worrisome  lytic or sclerotic osseous abnormality. CT ABDOMEN PELVIS FINDINGS Hepatobiliary: Scattered small hypodensities in the liver parenchyma are too small to characterize  but likely benign. Panic calcified gallstones evident. No intrahepatic or extrahepatic biliary dilation. Pancreas: No focal mass lesion. No dilatation of the main duct. No intraparenchymal cyst. No peripancreatic edema. Spleen: No splenomegaly. No focal mass lesion. Adrenals/Urinary Tract: No adrenal nodule or mass. Tiny 7 mm subcapsular low-density interpolar right kidney is too small to characterize but likely benign. No hydronephrosis in either kidney. No renal stone disease. No CT findings to suggest pyelonephritis. No evidence for hydroureter. Prostate gland generates mass-effect on the inferior bladder. Stomach/Bowel: Stomach is unremarkable. No gastric wall thickening. No evidence of outlet obstruction. Duodenum is normally positioned as is the ligament of Treitz. No small bowel wall thickening. No small bowel dilatation. The terminal ileum is normal. The appendix is not well visualized, but there is no edema or inflammation in the region of the cecum. No gross colonic mass. No colonic wall thickening. Vascular/Lymphatic: There is abdominal aortic atherosclerosis without aneurysm. There is no gastrohepatic or hepatoduodenal ligament lymphadenopathy. No retroperitoneal or mesenteric lymphadenopathy. No pelvic sidewall lymphadenopathy. Reproductive: Prostate gland is enlarged. Other: No intraperitoneal free fluid. Musculoskeletal: No worrisome lytic or sclerotic osseous abnormality. IMPRESSION: 1. Patchy ground-glass airspace disease in the right upper, middle, and lower lobes, somewhat obscured by breathing motion. Imaging features compatible with multifocal pneumonia and atypical etiology would be a consideration. Mild collapse/consolidative opacity is noted both posterior lung bases. 2. 7 mm posterior right upper lobe pulmonary nodule. Non-contrast chest CT at 6-12 months is recommended. If the nodule is stable at time of repeat CT, then future CT at 18-24 months (from today's scan) is considered optional  for low-risk patients, but is recommended for high-risk patients. This recommendation follows the consensus statement: Guidelines for Management of Incidental Pulmonary Nodules Detected on CT Images: From the Fleischner Society 2017; Radiology 2017; 284:228-243. 3. No acute findings in the abdomen or pelvis. 4. Cholelithiasis. 5. Prostatomegaly. 6. Aortic Atherosclerosis (ICD10-I70.0). Electronically Signed   By: Misty Stanley M.D.   On: 09/24/2020 09:31   DG Chest Port 1 View  Result Date: 09/24/2020 CLINICAL DATA:  Questionable sepsis EXAM: PORTABLE CHEST 1 VIEW COMPARISON:  07/23/2020 FINDINGS: Low volume chest with interstitial prominence and asymmetric hazy density on the right. Normal heart size. Prior CABG. IMPRESSION: 1. Hazy density on the right which could reflect early pneumonia in this setting. 2. Low volume chest. Electronically Signed   By: Monte Fantasia M.D.   On: 09/24/2020 06:32     Assessment/Plan Principal Problem:   Sepsis ([place]) Active Problems:   Hairy cell leukemia ([facility])   GERD (gastroesophageal reflux disease)   Dementia without behavioral disturbance ([facility])   CKD (chronic kidney disease), stage III ([place])   Community acquired pneumonia   Pulmonary nodule     Sepsis from community-acquired pneumonia Rule out aspiration pneumonia As evidenced by fever with a T-max of 101, tachycardia, tachypnea and elevated lactic acid level which shows a downward trend Chest x-ray reviewed by me shows a hazy density in the right lower lobe which could reflect early pneumonia and CT scan of the chest without contrast shows patchy ground-glass airspace disease in the right upper, middle, and lower lobes, somewhat obscured by breathing motion. Imaging features compatible with multifocal pneumonia  We will place patient on empiric antibiotic therapy with Rocephin and Zithromax for presumed community-acquired pneumonia Continue IV fluid resuscitation We will request speech therapy  consult swallow function evaluation rule out aspiration Follow-up results of blood cultures    Pulmonary nodule Noted on CT scan, patient has a 7 mm posterior right upper lobe pulmonary nodule. Non-contrast chest CT at 6-12 months is recommended. If the nodule is stable at time of repeat CT, then future CT at 18-24 months (from today's scan) is considered optional for low-risk patients, but is recommended for high-risk patients.    Pancytopenia Most likely related to patient's history of hairy cell leukemia  Monitor blood cell lines closely during this hospitalization   Recurrent hairy cell leukemia Patient has completed chemotherapy with rituxan plus Cladribine Follow-up with oncology as outpatient    Coronary artery disease Continue nitrates as much as blood pressure tolerates Continue statins   BPH Continue Flomax and finasteride   Dementia Continue Aricept     Chronic kidney disease, stage III Renal function is stable We will monitor closely    DVT prophylaxis: Lovenox Code Status: DO NOT RESUSCITATE Family Communication: Greater than 50% of time was spent discussing patient's condition and plan of care with his son at the bedside.  All questions and concerns have been addressed.  CODE STATUS was discussed and patient is a DO NOT RESUSCITATE. Disposition Plan: Back to previous home environment Consults called: Speech therapy Status: Inpatient.  The medical decision making for this patient was of high complexity and patient is at high risk for clinical deterioration during this hospitalization.    Collier Bullock MD [facility]     09/24/2020, 11:56 AM

## 2020-09-24 NOTE — ED Provider Notes (Signed)
[facility] Emergency Department Provider Note  ____________________________________________   Event Date/Time   First MD Initiated Contact with Patient 09/24/20 [phone]     (approximate)  I have reviewed the triage vital signs and the nursing notes.   HISTORY  Chief Complaint Altered Mental Status  Level 5 caveat:  history/ROS limited by acute/critical illness  HPI Frank Mcdaniel is a 87 y.o. male with medical history as listed below who presents for some confusion/altered mental status in the setting of fever. He lives with his son who is present. He said he had a good day yesterday and everything seemed normal until he came into the son's room and was "acting crazy and not making sense". He was breathing fast but denying any shortness of breath. He was not having any pain but was very confused. His son called EMS and they found that his temperature was 101 and that his oxygen saturation was in the 60s to 70s.  He continues to be in no distress upon arrival in the emergency department on a nonrebreather.  Of note his son reports that about 2 weeks ago the patient had an upper endoscopy but is doing well since that time with no report of pain or any complications.         Past Medical History:  Diagnosis Date  . Anemia 09/01/2015  . Anxiety 09/11/2015  . Arthritis   . Asthma    as a teenager  . Asymptomatic Sinus Bradycardia   . Atypical chest pain    a. 08/2014  . BPH (benign prostatic hyperplasia)   . CKD (chronic kidney disease), stage III ([place])   . Closed fracture of tuft of distal phalanx of finger 08/31/2018  . Community acquired pneumonia 08/10/2020  . Coronary artery disease    a. 2000 s/p CABG;  b. 10/2008 Neg MV, EF 64%; c. cath 05/2015: LM 85% sev cal, pLAD-1 lesion 80%, pLAD-2 lesion 100% chronic, ostLCx 70% sev cal, OM4 90%, ostRCA 70%, mid RCA 90%, LIMA-LAD patent, VG-OM2 patent, VG-OM3 patent, VG-RPDA patent. No AS  . Dementia without  behavioral disturbance ([place]) 05/28/2019  . GERD (gastroesophageal reflux disease)   . H/O echocardiogram    a. 10/2005 Echo: nl EF.  [name] Hairy cell leukemia ([place])   . HOH (hard of hearing)    Bilateral hearing aids  . HTN (hypertension)   . Hyperlipidemia   . Hypertension   . Leukemia ([place])   . Neutropenic fever ([place]) 11/08/2019  . Stricture and stenosis of esophagus     Patient Active Problem List   Diagnosis Date Noted  . Thickening of esophagus   . Community acquired pneumonia 08/10/2020  . UTI (urinary tract infection) 08/10/2020  . Palliative care encounter   . Febrile neutropenia ([place]) 07/18/2020  . CKD (chronic kidney disease), stage III ([place]) 02/28/2020  . Pancytopenia ([place]) 01/07/2020  . C. difficile diarrhea 12/31/2019  . BRBPR (bright red blood per rectum) 12/31/2019  . Abnormal EKG 11/23/2019  . IBS (irritable bowel syndrome) 08/02/2019  . Dementia without behavioral disturbance ([place]) 05/28/2019  . Status post inguinal hernia repair 09/13/2016  . Constipation 05/21/2016  . Memory loss or impairment 02/15/2016  . Chronic fatigue 12/21/2015  . Bilateral low back pain without sciatica 12/21/2015  . Anxiety 09/11/2015  . Anemia 09/01/2015  . GERD (gastroesophageal reflux disease) 06/02/2015  . Asthma 01/28/2015  . BPH (benign prostatic hyperplasia) 01/28/2015  . Hairy cell leukemia ([place]) 01/28/2015  . Coronary artery disease   .  S/P CABG (coronary artery bypass graft) 11/08/2010  . HTN (hypertension) 11/08/2010  . Bradycardia 11/08/2010  . Hyperlipidemia 10/27/2009    Past Surgical History:  Procedure Laterality Date  . CARDIAC CATHETERIZATION N/A 05/26/2015   Procedure: Left Heart Cath;  Surgeon: [name], MD;  Location: [facility];  Service: Cardiovascular;  Laterality: N/A;  . CARDIAC CATHETERIZATION N/A 05/26/2015   Procedure: Coronary/Graft Angiography;  Surgeon: [name], MD;  Location: [facility];  Service:  Cardiovascular;  Laterality: N/A;  . CATARACT EXTRACTION    . CORONARY ARTERY BYPASS GRAFT  2000  . ESOPHAGOGASTRODUODENOSCOPY N/A 02/18/2017   Procedure: ESOPHAGOGASTRODUODENOSCOPY (EGD) with removal of food bolus;  Surgeon: [name], MD;  Location: [facility] ENDOSCOPY;  Service: Endoscopy;  Laterality: N/A;  . ESOPHAGOGASTRODUODENOSCOPY (EGD) WITH PROPOFOL N/A 03/11/2017   Procedure: ESOPHAGOGASTRODUODENOSCOPY (EGD) WITH PROPOFOL;  Surgeon: [name], MD;  Location: [facility] ENDOSCOPY;  Service: Endoscopy;  Laterality: N/A;  . ESOPHAGOGASTRODUODENOSCOPY (EGD) WITH PROPOFOL N/A 04/15/2017   Procedure: ESOPHAGOGASTRODUODENOSCOPY (EGD) WITH PROPOFOL;  Surgeon: [name], MD;  Location: [facility] ENDOSCOPY;  Service: Endoscopy;  Laterality: N/A;  . ESOPHAGOGASTRODUODENOSCOPY (EGD) WITH PROPOFOL N/A 09/12/2020   Procedure: ESOPHAGOGASTRODUODENOSCOPY (EGD) WITH PROPOFOL;  Surgeon: Lin Landsman, MD;  Location: [facility] ENDOSCOPY;  Service: Gastroenterology;  Laterality: N/A;  . EYE SURGERY Left    Cataract Extraction with IOL  . INGUINAL HERNIA REPAIR Left 06/24/2016   Procedure: HERNIA REPAIR INGUINAL ADULT;  Surgeon: [name], MD;  Location: [facility] ORS;  Service: General;  Laterality: Left;  . TONSILLECTOMY  1942   . UPPER GASTROINTESTINAL ENDOSCOPY  3 years ago    with Dilation.     Prior to Admission medications   Medication Sig Start Date End Date Taking? Authorizing Provider  atorvastatin (LIPITOR) 40 MG tablet TAKE 1/2 TABLETS (20 MG TOTAL) BY MOUTH DAILY. Patient taking differently: Take 20 mg by mouth daily. 10/26/19   [name], MD  Calcium Carbonate Antacid (TUMS CHEWY BITES PO) Take 1 tablet by mouth as needed.     [provider]  CLINPRO 5000 1.1 % PSTE 1 application as directed. USE IN PLACE OF REGULAR TOOTHPASTE. USE PEASIZED AMOUNT, BRUSH DAILY AT LEAST 2 MINUTES. SPIT OUT. 04/11/19   [provider]  donepezil (ARICEPT) 10 MG tablet Take 1 tablet (10 mg  total) by mouth at bedtime. 06/07/20   [name], MD  finasteride (PROSCAR) 5 MG tablet TAKE 1 TABLET BY MOUTH EVERY DAY 08/09/20   [name], MD  isosorbide mononitrate (IMDUR) 30 MG 24 hr tablet TAKE 1 TABLET BY MOUTH EVERY DAY Patient taking differently: Take 30 mg by mouth daily. 08/31/19   [name], MD  Multiple Vitamin (MULTIVITAMIN) tablet Take 1 tablet by mouth daily.    [provider]  ondansetron (ZOFRAN) 8 MG tablet Take 1 tablet (8 mg total) by mouth 2 (two) times daily as needed (Nausea or vomiting). 10/18/19   [name], MD  pantoprazole (PROTONIX) 40 MG tablet Take 1 tablet (40 mg total) by mouth daily. 09/13/20 11/12/20  [name], MD  prochlorperazine (COMPAZINE) 10 MG tablet Take 1 tablet (10 mg total) by mouth every 6 (six) hours as needed (Nausea or vomiting). 10/18/19   [name], MD  tamsulosin (FLOMAX) 0.4 MG CAPS capsule TAKE 2 CAPSULES BY MOUTH EVERY DAY Patient taking differently: Take 0.8 mg by mouth daily. TAKE 2 CAPSULES BY MOUTH EVERY DAY 07/07/20  [name], MD    Allergies Patient has no known allergies.  Family History  Problem Relation Age of Onset  . Hypertension Mother   . Arthritis Father   . Other Other        no premature CAD.  [name] Colon cancer Neg Hx   . Prostate cancer Neg Hx   . Stomach cancer Neg Hx     Social History Social History   Tobacco Use  . Smoking status: Never Smoker  . Smokeless tobacco: Never Used  Vaping Use  . Vaping Use: Never used  Substance Use Topics  . Alcohol use: No  . Drug use: No    Review of Systems Level 5 caveat:  history/ROS limited by acute/critical illness. ____________________________________________   PHYSICAL EXAM:  VITAL SIGNS: ED Triage Vitals  Enc Vitals Group     BP 09/24/20 0532 (!) 106/55     Pulse Rate 09/24/20 0532 (!) 101     Resp 09/24/20 0532 (!) 24     Temp 09/24/20 0600 100 F (37.8 C)     Temp Source 09/24/20  0532 Oral     SpO2 09/24/20 0532 (!) 73 %     Weight 09/24/20 0534 70.8 kg (156 lb 1.6 oz)     Height 09/24/20 0534 1.6 m (5\' 3" )     Head Circumference --      Peak Flow --      Pain Score 09/24/20 0534 0     Pain Loc --      Pain Edu? --      Excl. in [place]? --     Constitutional: Awake and alert, confused but with no reports of pain. Eyes: Conjunctivae are normal.  Head: Atraumatic. Nose: No congestion/rhinnorhea. Mouth/Throat: Patient is wearing a mask. Neck: No stridor.  No meningeal signs.   Cardiovascular: Tachycardia, regular rhythm. Good peripheral circulation. Respiratory: Increased respiratory rate, some coarse breath sounds, no wheezing, frequent cough. Gastrointestinal: Soft and nontender. No distention.  Musculoskeletal: No lower extremity tenderness nor edema. No gross deformities of extremities. Neurologic:  Normal speech and language. No gross focal neurologic deficits are appreciated.  Skin:  Skin is warm, dry and intact.  ____________________________________________   LABS (all labs ordered are listed, but only abnormal results are displayed)  Labs Reviewed  LACTIC ACID, PLASMA - Abnormal; Notable for the following components:      Result Value   Lactic Acid, Venous 2.4 (*)    All other components within normal limits  COMPREHENSIVE METABOLIC PANEL - Abnormal; Notable for the following components:   Glucose, Bld 117 (*)    Creatinine, Ser 1.34 (*)    Total Bilirubin 1.4 (*)    GFR, Estimated 52 (*)    All other components within normal limits  CBC WITH DIFFERENTIAL/PLATELET - Abnormal; Notable for the following components:   RBC 2.76 (*)    Hemoglobin 10.0 (*)    HCT 29.3 (*)    MCV 106.2 (*)    MCH 36.2 (*)    Platelets 120 (*)    Lymphs Abs 0.1 (*)    All other components within normal limits  URINALYSIS, COMPLETE (UACMP) WITH MICROSCOPIC - Abnormal; Notable for the following components:   Color, Urine YELLOW (*)    APPearance HAZY (*)    Hgb  urine dipstick LARGE (*)    RBC / HPF >50 (*)    All other components within normal limits  LIPASE, BLOOD - Abnormal; Notable for the following components:  Lipase 59 (*)    All other components within normal limits  BRAIN NATRIURETIC PEPTIDE - Abnormal; Notable for the following components:   B Natriuretic Peptide 110.2 (*)    All other components within normal limits  TROPONIN I (HIGH SENSITIVITY) - Abnormal; Notable for the following components:   Troponin I (High Sensitivity) 19 (*)    All other components within normal limits  TROPONIN I (HIGH SENSITIVITY) - Abnormal; Notable for the following components:   Troponin I (High Sensitivity) 21 (*)    All other components within normal limits  CULTURE, BLOOD (ROUTINE X 2)  CULTURE, BLOOD (ROUTINE X 2)  RESP PANEL BY RT-PCR (FLU A&B, COVID) ARPGX2  URINE CULTURE  LACTIC ACID, PLASMA  PROTIME-INR  APTT  POC SARS CORONAVIRUS 2 AG -  ED   ____________________________________________  EKG  ED ECG REPORT I, Hinda Kehr, the attending physician, personally viewed and interpreted this ECG.  Date: 09/24/2020 EKG Time: 5:31 Rate: 99 Rhythm: normal sinus rhythm QRS Axis: normal Intervals: RBBB, LVH ST/T Wave abnormalities: Non-specific ST segment / T-wave changes, but no clear evidence of acute ischemia. Narrative Interpretation: no definitive evidence of acute ischemia; does not meet STEMI criteria.   ____________________________________________  RADIOLOGY I, Hinda Kehr, personally viewed and evaluated these images (plain radiographs) as part of my medical decision making, as well as reviewing the written report by the radiologist.  ED MD interpretation: Possible right-sided pneumonia  Official radiology report(s): DG Chest Port 1 View  Result Date: 09/24/2020 CLINICAL DATA:  Questionable sepsis EXAM: PORTABLE CHEST 1 VIEW COMPARISON:  07/23/2020 FINDINGS: Low volume chest with interstitial prominence and asymmetric hazy  density on the right. Normal heart size. Prior CABG. IMPRESSION: 1. Hazy density on the right which could reflect early pneumonia in this setting. 2. Low volume chest. Electronically Signed   By: Monte Fantasia M.D.   On: 09/24/2020 06:32    ____________________________________________   PROCEDURES   Procedure(s) performed (including Critical Care):  Procedures   ____________________________________________   INITIAL IMPRESSION / MDM / [place] / ED COURSE  As part of my medical decision making, I reviewed the following data within the [place] History obtained from family, Nursing notes reviewed and incorporated, Labs reviewed , EKG interpreted , Old chart reviewed, Patient signed out to Dr. Quentin Cornwall, Radiograph reviewed  and Notes from prior ED visits   Differential diagnosis includes, but is not limited to, sepsis, complication from EGD, pneumonia, urinary tract infection, COVID-19. Less likely intra-abdominal infection given lack of abdominal pain, nausea, and vomiting.  The patient is on the cardiac monitor to evaluate for evidence of arrhythmia and/or significant heart rate changes.  I initiated sepsis protocol but am holding off on antibiotics until we can get the urine sample; I asked the nurses to perform in and out catheterization. Patient is febrile by rectal temperature. I am initiating a 1 L lactated ringer fluid bolus until the first lactic acid comes back and I can increase the fluids to 30 mils per kilo if necessary. Broad sepsis laboratory evaluation is pending. Patient is in no distress in spite of his oxygen requirement but we will wean down to 4 L of oxygen to see if his hypoxemia improves on supplemental oxygen. Patient has had both COVID-19 vaccinations and a booster and I think that bacterial infection is more likely for this patient.     Clinical Course as of 09/24/20 0914  Sun Sep 24, 2020  0540 SARS Coronavirus 2  Ag:  NEGATIVE Sending PCR test since the rapid antigen was negative [CF]  0612 Urine has been obtained by and out catheterization so I am ordering empiric antibiotics for unknown source. The nurses tell me that the urine specimen looked infected, but he has also recently had an upper endoscopy and is noticeably hypoxemic. He is appropriate for broad-spectrum coverage for unknown source. This includes cefepime 2 g IV, vancomycin per pharmacy, and metronidazole 500 mg IV. [CF]  6295 CBC WITH DIFFERENTIAL(!) Generally reassuring CBC including no leukocytosis [CF]  0644 Comprehensive metabolic panel(!) Essentially normal comprehensive metabolic panel with stable creatinine [CF]  0645 DG Chest [facility] I personally reviewed the patient's imaging and agree with the radiologist's interpretation that there may be an early developing pneumonia which could be consistent with his hypoxemia and tachypnea. [CF]  0645 Lipase(!): 59 Patient unlikely to have pancreatitis based on a lipase of 59 and with no abdominal symptoms [CF]  0645 Troponin I (High Sensitivity)(!): 19 Suspect demand ischemia in the setting of sepsis. [CF]  2841 Lactic Acid, Venous(!!): 2.4 Proceeding with additional fluid boluses as per protocol. [CF]  3244 Urinalysis notable for hematuria but no obvious infection.  This is somewhat concerning given the possibility for obstructive uropathy and possible infected stone.  Given the nondiagnostic chest x-ray with question of pneumonia and the concern for an obstructing ureteral stone, I ordered a CT chest/abdomen/pelvis with IV contrast for further evaluation of possible sources of infection and to determine if emergent urological intervention is necessary in the event of obstructing ureteral stone. [CF]  [id number] Transferring ED care to Dr. Quentin Cornwall to follow-up on CT scan and consult the hospitalist for admission.  The patient's son knows the current plan. [CF]  0815 Sepsis  [CF]  0816 Sepsis  reassessment complete.  Blood pressure is 116/58, mental status is the same, peripheral perfusion is appropriate. [CF]    Clinical Course User Index [CF] Hinda Kehr, MD     ____________________________________________  FINAL CLINICAL IMPRESSION(S) / ED DIAGNOSES  Final diagnoses:  Severe sepsis ([place])  Acute respiratory failure with hypoxemia (HCC)  HCAP (healthcare-associated pneumonia)  Pneumonia  Hematuria  Elevated troponin level     MEDICATIONS GIVEN DURING THIS VISIT:  Medications  vancomycin (VANCOREADY) IVPB 1750 mg/350 mL (1,750 mg Intravenous New Bag/Given 09/24/20 0752)  lactated ringers bolus 1,000 mL (0 mLs Intravenous Stopped 09/24/20 0713)  ceFEPIme (MAXIPIME) 2 g in sodium chloride 0.9 % 100 mL IVPB (0 g Intravenous Stopped 09/24/20 0653)  metroNIDAZOLE (FLAGYL) IVPB 500 mg (0 mg Intravenous Stopped 09/24/20 0748)  lactated ringers bolus 1,000 mL (0 mLs Intravenous Stopped 09/24/20 0102)    And  lactated ringers bolus 250 mL (0 mLs Intravenous Stopped 09/24/20 0830)  iohexol (OMNIPAQUE) 300 MG/ML solution 100 mL (100 mLs Intravenous Contrast Given 09/24/20 0841)     ED Discharge Orders    None      *Please note:  Dawood E Campton was evaluated in Emergency Department on 09/24/2020 for the symptoms described in the history of present illness. He was evaluated in the context of the global COVID-19 pandemic, which necessitated consideration that the patient might be at risk for infection with the SARS-CoV-2 virus that causes COVID-19. Institutional protocols and algorithms that pertain to the evaluation of patients at risk for COVID-19 are in a state of rapid change based on information released by regulatory bodies including the CDC and federal and state organizations. These policies and algorithms were followed during the patient's care  in the ED.  Some ED evaluations and interventions may be delayed as a result of limited staffing during and after the  pandemic.*  Note:  This document was prepared using Dragon voice recognition software and may include unintentional dictation errors.   Hinda Kehr, MD 09/24/20 [id number]

## 2020-09-24 NOTE — ED Triage Notes (Signed)
Patient arrives from home via [facility] with c/o AMS. patient presents with fever 101, tachycardia, increased RR. Patient has hx of stroke. Son called to come bedside to assist with patient history.

## 2020-09-24 NOTE — Progress Notes (Signed)
PHARMACY -  BRIEF ANTIBIOTIC NOTE   Pharmacy has received consult(s) for Cefepime, Vancomycin  from an ED provider.  The patient's profile has been reviewed for ht/wt/allergies/indication/available labs.    One time order(s) placed for Vancomycin 1750 mg IV X 1 and Cefepime 2 gm IV X 1   Further antibiotics/pharmacy consults should be ordered by admitting physician if indicated.                       Thank you, Yarelli Decelles D 09/24/2020  6:18 AM

## 2020-09-24 NOTE — Progress Notes (Signed)
CODE SEPSIS - PHARMACY COMMUNICATION  **Broad Spectrum Antibiotics should be administered within 1 hour of Sepsis diagnosis**  Time Code Sepsis Called/Page Received:  2/20 @ 0540   Antibiotics Ordered: Cefepime, Vancomycin   Time of 1st antibiotic administration: Cefepime 2 gm IV X 1 @ [phone]   Additional action taken by pharmacy:   If necessary, Name of Provider/Nurse Contacted:     Shi Blankenship D ,PharmD Clinical Pharmacist  09/24/2020  6:42 AM

## 2020-09-24 NOTE — ED Notes (Signed)
Critical Lactic acid result: 2.4 received from lab. Hinda Kehr, MD informed, orders placed.

## 2020-09-24 NOTE — ED Notes (Signed)
Attempted to start IV x2. This RN unsuccessful. IV team consult placed.

## 2020-09-25 ENCOUNTER — Telehealth: Payer: Self-pay

## 2020-09-25 DIAGNOSIS — J189 Pneumonia, unspecified organism: Secondary | ICD-10-CM

## 2020-09-25 DIAGNOSIS — N1831 Chronic kidney disease, stage 3a: Secondary | ICD-10-CM

## 2020-09-25 DIAGNOSIS — F0390 Unspecified dementia without behavioral disturbance: Secondary | ICD-10-CM

## 2020-09-25 LAB — URINE CULTURE: Culture: NO GROWTH

## 2020-09-25 LAB — PROTIME-INR
INR: 1.4 — ABNORMAL HIGH (ref 0.8–1.2)
Prothrombin Time: 16.8 seconds — ABNORMAL HIGH (ref 11.4–15.2)

## 2020-09-25 LAB — CBC
HCT: 23.2 % — ABNORMAL LOW (ref 39.0–52.0)
Hemoglobin: 8.1 g/dL — ABNORMAL LOW (ref 13.0–17.0)
MCH: 37.2 pg — ABNORMAL HIGH (ref 26.0–34.0)
MCHC: 34.9 g/dL (ref 30.0–36.0)
MCV: 106.4 fL — ABNORMAL HIGH (ref 80.0–100.0)
Platelets: 98 10*3/uL — ABNORMAL LOW (ref 150–400)
RBC: 2.18 MIL/uL — ABNORMAL LOW (ref 4.22–5.81)
RDW: 14.2 % (ref 11.5–15.5)
WBC: 3.3 10*3/uL — ABNORMAL LOW (ref 4.0–10.5)
nRBC: 0 % (ref 0.0–0.2)

## 2020-09-25 LAB — BASIC METABOLIC PANEL
Anion gap: 6 (ref 5–15)
BUN: 19 mg/dL (ref 8–23)
CO2: 24 mmol/L (ref 22–32)
Calcium: 8.4 mg/dL — ABNORMAL LOW (ref 8.9–10.3)
Chloride: 107 mmol/L (ref 98–111)
Creatinine, Ser: 1.39 mg/dL — ABNORMAL HIGH (ref 0.61–1.24)
GFR, Estimated: 49 mL/min — ABNORMAL LOW (ref >60)
Glucose, Bld: 100 mg/dL — ABNORMAL HIGH (ref 70–99)
Potassium: 3.3 mmol/L — ABNORMAL LOW (ref 3.5–5.1)
Sodium: 137 mmol/L (ref 135–145)

## 2020-09-25 LAB — CORTISOL-AM, BLOOD: Cortisol - AM: 11 ug/dL (ref 6.7–22.6)

## 2020-09-25 LAB — PROCALCITONIN: Procalcitonin: 0.7 ng/mL

## 2020-09-25 MED ORDER — POTASSIUM CHLORIDE CRYS ER 20 MEQ PO TBCR
40.0000 meq | EXTENDED_RELEASE_TABLET | Freq: Once | ORAL | Status: AC
  Administered 2020-09-25: 40 meq via ORAL
  Filled 2020-09-25: qty 2

## 2020-09-25 MED ORDER — AZITHROMYCIN 250 MG PO TABS
500.0000 mg | ORAL_TABLET | Freq: Every day | ORAL | Status: DC
  Administered 2020-09-26: 500 mg via ORAL
  Filled 2020-09-25: qty 2

## 2020-09-25 NOTE — Telephone Encounter (Signed)
I called the patient and inforemed him that I had not turned the paperwork for the insurance to scan and he could pick up a copy tomorrow and he understood.  Frank Mcdaniel,cma

## 2020-09-25 NOTE — Progress Notes (Signed)
1        [place] [facility] [place] NAME: Frank Mcdaniel    MR#:  [id number]  DATE OF BIRTH:  06/12/1933  SUBJECTIVE:  CHIEF COMPLAINT:   Chief Complaint  Patient presents with  . Altered Mental Status  Alert and awake but pleasantly confused.  Remains hypotensive.  Son at bedside. REVIEW OF SYSTEMS:  ROS difficult to obtain due to his mental status DRUG ALLERGIES:  No Known Allergies VITALS:  Blood pressure (!) 94/48, pulse (!) 59, temperature 98.7 F (37.1 C), temperature source Oral, resp. rate 20, height 5\' 3"  (1.6 m), weight 71.4 kg, SpO2 98 %. PHYSICAL EXAMINATION:  Physical Exam  87 year old male in no acute distress Cardiovascular S1-S2 normal no murmurs or gallop Lungs decreased breath sound at bases.  Scattered rhonchi at the right lower base GI soft nontender nondistended Musculoskeletal none tenderness Neurological nonfocal exam, awake.  Oriented to person but not to place or time Skin is warm and dry LABORATORY PANEL:  Male CBC Recent Labs  Lab 09/25/20 0627  WBC 3.3*  HGB 8.1*  HCT 23.2*  PLT 98*   ------------------------------------------------------------------------------------------------------------------ Chemistries  Recent Labs  Lab 09/24/20 0542 09/25/20 0436  NA 136 137  K 3.5 3.3*  CL 104 107  CO2 23 24  GLUCOSE 117* 100*  BUN 18 19  CREATININE 1.34* 1.39*  CALCIUM 9.0 8.4*  AST 22  --   ALT 12  --   ALKPHOS 69  --   BILITOT 1.4*  --    RADIOLOGY:  No results found. ASSESSMENT AND PLAN:  87 y.o. male with medical history significant for dementia, anxiety, GERD, hypertension, history of recurrent hairy cell leukemia who  recently completed chemotherapy on 09/05/20 was brought into the ER by EMS after his son called for evaluation of worsening confusion  Sepsis present on admission from aspiration pneumonia and or community-acquired pneumonia Chest x-ray and CT confirms right sided consolidation consistent  with pneumonia Continue intravenous antibiotics Speech therapy input appreciated.  Patient has known history of tortuous esophagus and esophageal stenosis which was dilated on 2/8 by GI He remains at high risk for recurrent aspiration  Pulmonary nodule Stable and chronic Patient's son is well aware of his and he was scheduled for repeat scan in April so he may hold off doing so now and wait for another 6 months to get a repeat CT scan   Hypokalemia Replete and recheck  Pancytopenia Due to hairy cell leukemia  Recurrent hairy cell leukemia Followed by oncology as an outpatient  Coronary artery disease Continue nitrates as much as blood pressure tolerates Continue statins  BPH Continue Flomax and finasteride  Dementia Continue Aricept  Chronic kidney disease,stage III A Renal function is stable We will monitor closely  Body mass index is 27.88 kg/m.  Net IO Since Admission: 2,726.63 mL [09/25/20 1710]      Status is: Inpatient  Remains inpatient appropriate because:IV treatments appropriate due to intensity of illness or inability to take PO   Dispo: The patient is from: Home              Anticipated d/c is to: Home              Anticipated d/c date is: 1 day              Patient currently is not medically stable to d/c.   Difficult to place patient No   DVT prophylaxis:  enoxaparin (LOVENOX) injection 40 mg Start: 09/24/20 1600     Family Communication: Son at bedside   All the records are reviewed and case discussed with Care Management/Social Worker. Management plans discussed with the patient, family and they are in agreement.  CODE STATUS: DNR Level of care: Progressive Cardiac  TOTAL TIME TAKING CARE OF THIS PATIENT: 35 minutes.   More than 50% of the time was spent in counseling/coordination of care: YES  POSSIBLE D/C IN 1-2 DAYS, DEPENDING ON CLINICAL CONDITION.   Max Sane M.D on 09/25/2020 at 5:10 PM  [facility]    CC: Primary care physician; [name], MD  Note: This dictation was prepared with Dragon dictation along with smaller phrase technology. Any transcriptional errors that result from this process are unintentional.

## 2020-09-25 NOTE — Evaluation (Addendum)
Clinical/Bedside Swallow Evaluation Patient Details  Name: Frank Mcdaniel MRN: [id number] Date of Birth: [birth year]  Today's Date: 09/25/2020 Time: SLP Start Time (ACUTE ONLY): 1010 SLP Stop Time (ACUTE ONLY): 1100 SLP Time Calculation (min) (ACUTE ONLY): 50 min  Past Medical History:  Past Medical History:  Diagnosis Date  . Anemia 09/01/2015  . Anxiety 09/11/2015  . Arthritis   . Asthma    as a teenager  . Asymptomatic Sinus Bradycardia   . Atypical chest pain    a. 08/2014  . BPH (benign prostatic hyperplasia)   . CKD (chronic kidney disease), stage III ([place])   . Closed fracture of tuft of distal phalanx of finger 08/31/2018  . Community acquired pneumonia 08/10/2020  . Coronary artery disease    a. 2000 s/p CABG;  b. 10/2008 Neg MV, EF 64%; c. cath 05/2015: LM 85% sev cal, pLAD-1 lesion 80%, pLAD-2 lesion 100% chronic, ostLCx 70% sev cal, OM4 90%, ostRCA 70%, mid RCA 90%, LIMA-LAD patent, VG-OM2 patent, VG-OM3 patent, VG-RPDA patent. No AS  . Dementia without behavioral disturbance ([place]) 05/28/2019  . GERD (gastroesophageal reflux disease)   . H/O echocardiogram    a. 10/2005 Echo: nl EF.  [name] Hairy cell leukemia ([place])   . HOH (hard of hearing)    Bilateral hearing aids  . HTN (hypertension)   . Hyperlipidemia   . Hypertension   . Leukemia ()   . Neutropenic fever ([place]) 11/08/2019  . Stricture and stenosis of esophagus    Past Surgical History:  Past Surgical History:  Procedure Laterality Date  . CARDIAC CATHETERIZATION N/A 05/26/2015   Procedure: Left Heart Cath;  Surgeon: [name], MD;  Location: [facility];  Service: Cardiovascular;  Laterality: N/A;  . CARDIAC CATHETERIZATION N/A 05/26/2015   Procedure: Coronary/Graft Angiography;  Surgeon: [name], MD;  Location: [facility];  Service: Cardiovascular;  Laterality: N/A;  . CATARACT EXTRACTION    . CORONARY ARTERY BYPASS GRAFT  2000  . ESOPHAGOGASTRODUODENOSCOPY N/A 02/18/2017    Procedure: ESOPHAGOGASTRODUODENOSCOPY (EGD) with removal of food bolus;  Surgeon: [name], MD;  Location: [facility] ENDOSCOPY;  Service: Endoscopy;  Laterality: N/A;  . ESOPHAGOGASTRODUODENOSCOPY (EGD) WITH PROPOFOL N/A 03/11/2017   Procedure: ESOPHAGOGASTRODUODENOSCOPY (EGD) WITH PROPOFOL;  Surgeon: [name], MD;  Location: [facility] ENDOSCOPY;  Service: Endoscopy;  Laterality: N/A;  . ESOPHAGOGASTRODUODENOSCOPY (EGD) WITH PROPOFOL N/A 04/15/2017   Procedure: ESOPHAGOGASTRODUODENOSCOPY (EGD) WITH PROPOFOL;  Surgeon: [name], MD;  Location: [facility] ENDOSCOPY;  Service: Endoscopy;  Laterality: N/A;  . ESOPHAGOGASTRODUODENOSCOPY (EGD) WITH PROPOFOL N/A 09/12/2020   Procedure: ESOPHAGOGASTRODUODENOSCOPY (EGD) WITH PROPOFOL;  Surgeon: Lin Landsman, MD;  Location: [facility] ENDOSCOPY;  Service: Gastroenterology;  Laterality: N/A;  . EYE SURGERY Left    Cataract Extraction with IOL  . INGUINAL HERNIA REPAIR Left 06/24/2016   Procedure: HERNIA REPAIR INGUINAL ADULT;  Surgeon: [name], MD;  Location: [facility] ORS;  Service: General;  Laterality: Left;  . TONSILLECTOMY  1942   . UPPER GASTROINTESTINAL ENDOSCOPY  3 years ago    with Dilation.    HPI:  Pt is a 87 y.o. male with medical history significant for Dementia, anxiety, GERD, hypertension, history of recurrent hairy cell leukemia, Mod size Hiatal Hernia who  recently completed chemotherapy on 09/05/20 was brought into the ER by EMS after his son called for evaluation of worsening confusion.  Patient has Dementia with some baseline confusion but on the day of his admission his son said he had difficulty  speaking and not making any sense which is unusual for him.  This concerned his son who thought he may be having a TIA or stroke and so he called EMS.  When EMS arrived patient was found to have a fever with a temp of 101 F.  He was also hypoxic by EMS with pulse oximetry in the 70s and was placed on a nonrebreather mask.  According to the son, he has  been in his usual state of health without any issues and had a recent upper endoscopy on 09/12/2020 which showed Esophagitis with no bleeding, Medium size Hiatal Hernia and a mild 1 cm stenosis that was traversed during the procedure.  His son states that he has had no choking episodes and does not have any difficulty swallowing that he is aware of. CT of Chest: "Patchy ground-glass airspace disease in the right upper, middle,  and lower lobes, somewhat obscured by breathing motion. Imaging  features compatible with multifocal pneumonia and atypical etiology  would be a consideration. Mild collapse/consolidative opacity is  noted both posterior lung bases.  2. 7 mm posterior right upper lobe pulmonary nodule. Non-contrast  chest CT at 6-12 months is recommended.".   Assessment / Plan / Recommendation Clinical Impression  Pt appears to present w/ grossly adequate oropharyngeal phase swallowing function though has Baseline Esophageal phase Dysmotility including GERD and Mod. size Hiatal Hernia(per recent EGD w/ GI) 09/12/2020. Pt demonstrated no consistent or immediate, overt clinical s/s of aspiration, however a mild delayed cough and throat clear were noted w/ po trials. Unsure if related to his Baseline Esophageal phase Dysmotility or not. Due to pt's known history of tortuous esophagus and esophageal stenosis which was dilated on 09/12/2020 by GI and Mod Hiatal Hernia, pt is always at Risk for Regurgitation and aspiration of Backflow Reflux material thus Pulmonary decline. When following general aspiration precautions, no further s/s of aspiration noted. Pt appears at reduced risk for aspiration from an oropharyngeal phase standpoint when following precautions. He does have Baseline Dementia as well. Pt was explained general aspiration precautions and agreed verbally to the need for following them especially sitting upright for all oral intake and using Small, Single sips VIA CUP. Pt assisted Fully w/ positioning  then given trials of thin liquids Via Cup, ice chips, purees and soft solids which he fed self w/ setup support. No immediate, overt coughing was noted w/ trials of thin liquids Via Cup; no decline in respiratory effort or vocal quality during/post trials; respiratory status remained calm and unlabored. NO straws were utiilized for better oral control. Oral phase appeared grossly [facility] for bolus management and timely A-P transfer for swallowing; oral clearing achieved w/ all consistencies. Increased Belching noted post sips of liquids - suspect impact from Esophageal Dysmotility. Encouraged room temp liquids if more comfortable when swallowing.  Recommend continue current diet w/ more mech soft meats/foods w/ gravies added to moisten foods; Thin liquids VIA CUP. Recommend general aspiration precautions, No Straws; Pills Whole in Puree - NSG will teach for use at D/C as before; tray setup and positioning assistance for meals w/ monitoring for following aspiration and REFLUX precautions w/ all oral intake/meals. ST services will continue to f/u w/ pt for toleration of diet, as indicated, and education while admitted. MBSS is suspected prandial aspiration w/ oral intake. MD/NSG updated. Precautions posted at bedside. Handouts on general aspiration precautions; Esophageal dysmotility(baseline). Recommend continued f/u w/ GI(Esophageal dysmotility management). SLP Visit Diagnosis: Dysphagia, pharyngoesophageal phase (R13.14) (GERD; HIatal Hernia; Dementia)  Aspiration Risk  Mild aspiration risk;Risk for inadequate nutrition/hydration d/t Baseline Esophageal phase Dysmotility; Dementia   Diet Recommendation  Regular/Mech Soft w/ cut meats, moistened foods; Thin liquids VIA CUP ONLY.  General aspiration and REFLUX/GERD precautions.  Monitoring at meals for follow through w/ precautions.  Medication Administration: Whole meds with puree (for safer swallowing)    Other  Recommendations Recommended Consults:  Consider GI evaluation;Consider esophageal assessment (ongoing; Dietician f/u) Oral Care Recommendations: Oral care BID;Staff/trained caregiver to provide oral care Other Recommendations:  (n/a)   Follow up Recommendations None      Frequency and Duration  (n/a)   (n/a)       Prognosis Prognosis for Safe Diet Advancement: Fair Barriers to Reach Goals: Cognitive deficits;Time post onset;Severity of deficits (baseline deficits)      Swallow Study   General Date of Onset: 09/24/20 HPI: Pt is a 87 y.o. male with medical history significant for Dementia, anxiety, GERD, hypertension, history of recurrent hairy cell leukemia, Mod size Hiatal Hernia who  recently completed chemotherapy on 09/05/20 was brought into the ER by EMS after his son called for evaluation of worsening confusion.  Patient has Dementia with some baseline confusion but on the day of his admission his son said he had difficulty speaking and not making any sense which is unusual for him.  This concerned his son who thought he may be having a TIA or stroke and so he called EMS.  When EMS arrived patient was found to have a fever with a temp of 101 F.  He was also hypoxic by EMS with pulse oximetry in the 70s and was placed on a nonrebreather mask.  According to the son, he has been in his usual state of health without any issues and had a recent upper endoscopy on 09/12/2020 which showed Esophagitis with no bleeding, Medium size Hiatal Hernia and a mild 1 cm stenosis that was traversed during the procedure.  His son states that he has had no choking episodes and does not have any difficulty swallowing that he is aware of. CT of Chest: "Patchy ground-glass airspace disease in the right upper, middle,  and lower lobes, somewhat obscured by breathing motion. Imaging  features compatible with multifocal pneumonia and atypical etiology  would be a consideration. Mild collapse/consolidative opacity is  noted both posterior lung bases.  2. 7  mm posterior right upper lobe pulmonary nodule. Non-contrast  chest CT at 6-12 months is recommended.". Type of Study: Bedside Swallow Evaluation Previous Swallow Assessment: 12/15-12/20/2022 Diet Prior to this Study: Regular;Thin liquids Temperature Spikes Noted: No (wbc 3.3) Respiratory Status: Room air History of Recent Intubation: No Behavior/Cognition: Alert;Cooperative;Confused;Distractible;Requires cueing;Pleasant mood (baseline Dementia) Oral Cavity Assessment: Within Functional Limits Oral Care Completed by SLP: Yes Oral Cavity - Dentition: Adequate natural dentition;Missing dentition Vision: Functional for self-feeding Self-Feeding Abilities: Able to feed self;Needs assist;Needs set up Patient Positioning: Upright in bed (needed positioning) Baseline Vocal Quality: Normal Volitional Cough: Strong Volitional Swallow: Able to elicit    Oral/Motor/Sensory Function Overall Oral Motor/Sensory Function: Within functional limits   Ice Chips Ice chips: Within functional limits Presentation: Spoon (fed; 2 trials)   Thin Liquid Thin Liquid: Impaired Presentation: Cup;Self Fed (~4 ozs total) Pharyngeal  Phase Impairments: Throat Clearing - Delayed;Cough - Delayed (x1 each) Other Comments: not immediate to the swallows    Nectar Thick Nectar Thick Liquid: Not tested   Honey Thick Honey Thick Liquid: Not tested   Puree Puree: Within functional limits Presentation: Self Fed;Spoon (8 tirals)  Solid     Solid: Within functional limits Presentation: Spoon;Self Fed (5 trials) Other Comments: moistened        Orinda Kenner, [place], [place] Speech Language Pathologist Rehab Services [phone] [place] 09/25/2020,5:31 PM

## 2020-09-26 DIAGNOSIS — C9141 Hairy cell leukemia, in remission: Secondary | ICD-10-CM

## 2020-09-26 DIAGNOSIS — A419 Sepsis, unspecified organism: Principal | ICD-10-CM

## 2020-09-26 MED ORDER — HALOPERIDOL LACTATE 5 MG/ML IJ SOLN
2.0000 mg | Freq: Four times a day (QID) | INTRAMUSCULAR | Status: DC | PRN
Start: 2020-09-26 — End: 2020-09-26
  Administered 2020-09-26: 2 mg via INTRAVENOUS
  Filled 2020-09-26: qty 1

## 2020-09-26 MED ORDER — AMOXICILLIN-POT CLAVULANATE 875-125 MG PO TABS: 1.0000 | ORAL_TABLET | Freq: Two times a day (BID) | ORAL | 0 refills | Status: AC

## 2020-09-26 NOTE — Progress Notes (Signed)
[facility]) Hospital Liaison RN note:  Received new referral for [facility] out patient palliative program to follow post discharge from Dr. Max Sane. Awanya Caesar, TOC notified via epic chat. Referral has patient's information and will follow at discharge. Plan is to discharge to home once medically stable. [facility] Liaison will follow for disposition.  Thank you for this referral.  Zandra Abts, RN [facility] Liaison [phone]

## 2020-09-26 NOTE — Discharge Instructions (Signed)
Community-Acquired Pneumonia, Adult Pneumonia is an infection of the lungs. It causes irritation and swelling in the airways of the lungs. Mucus and fluid may also build up inside the airways. This may cause coughing and trouble breathing. One type of pneumonia can happen while you are in a hospital. A different type can happen when you are not in a hospital (community-acquired pneumonia). What are the causes? This condition is caused by germs (viruses, bacteria, or fungi). Some types of germs can spread from person to person. Pneumonia is not thought to spread from person to person.   What increases the risk? You are more likely to develop this condition if:  You have a long-term (chronic) disease, such as: ? Disease of the lungs. This may be chronic obstructive pulmonary disease (COPD) or asthma. ? Heart failure. ? Cystic fibrosis. ? Diabetes. ? Kidney disease. ? Sickle cell disease. ? HIV.  You have other health problems, such as: ? Your body's defense system (immune system) is weak. ? A condition that may cause you to breathe in fluids from your mouth and nose.  You had your spleen taken out.  You do not take good care of your teeth and mouth (poor dental hygiene).  You use or have used tobacco products.  You travel where the germs that cause this illness are common.  You are near certain animals or the places they live.  You are older than 85 years of age. What are the signs or symptoms? Symptoms of this condition include:  A cough.  A fever.  Sweating or chills.  Chest pain, often when you breathe deeply or cough.  Breathing problems, such as: ? Fast breathing. ? Trouble breathing. ? Shortness of breath.  Feeling tired (fatigued).  Muscle aches. How is this treated? Treatment for this condition depends on many things, such as:  The cause of your illness.  Your medicines.  Your other health problems. Most adults can be treated at home. Sometimes,  treatment must happen in a hospital.  Treatment may include medicines to kill germs.  Medicines may depend on which germ caused your illness. Very bad pneumonia is rare. If you get it, you may:  Have a machine to help you breathe.  Have fluid taken away from around your lungs. Follow these instructions at home: Medicines  Take over-the-counter and prescription medicines only as told by your doctor.  Take cough medicine only if you are losing sleep. Cough medicine can keep your body from taking mucus away from your lungs.  If you were prescribed an antibiotic medicine, take it as told by your doctor. Do not stop taking the antibiotic even if you start to feel better. Lifestyle  Do not drink alcohol.  Do not use any products that contain nicotine or tobacco, such as cigarettes, e-cigarettes, and chewing tobacco. If you need help quitting, ask your doctor.  Eat a healthy diet. This includes a lot of vegetables, fruits, whole grains, low-fat dairy products, and low-fat (lean) protein.      General instructions  Rest a lot. Sleep for at least 8 hours each night.  Sleep with your head and neck raised. Put a few pillows under your head or sleep in a reclining chair.  Return to your normal activities as told by your doctor. Ask your doctor what activities are safe for you.  Drink enough fluid to keep your pee (urine) pale yellow.  If your throat is sore, rinse your mouth often with salt water. To make salt   water, dissolve -1 tsp (3-6 g) of salt in 1 cup (237 mL) of warm water.  Keep all follow-up visits as told by your doctor. This is important.   How is this prevented? You can lower your risk of pneumonia by:  Getting the pneumonia shot (vaccine). These shots have different types and schedules. Ask your doctor what works best for you. Think about getting this shot if: ? You are older than 85 years of age. ? You are 19-65 years of age and:  You are being treated for  cancer.  You have long-term lung disease.  You have other problems that affect your body's defense system. Ask your doctor if you have one of these.  Getting your flu shot every year. Ask your doctor which type of shot is best for you.  Going to the dentist as often as told.  Washing your hands often with soap and water for at least 20 seconds. If you cannot use soap and water, use hand sanitizer. Contact a doctor if:  You have a fever.  You lose sleep because your cough medicine does not help. Get help right away if:  You are short of breath and this gets worse.  You have more chest pain.  Your sickness gets worse. This is very serious if: ? You are an older adult. ? Your body's defense system is weak.  You cough up blood. These symptoms may be an emergency. Do not wait to see if the symptoms will go away. Get medical help right away. Call your local emergency services (911 in the [place] Do not drive yourself to the hospital. Summary  Pneumonia is an infection of the lungs.  Community-acquired pneumonia affects people who have not been in the hospital. Certain germs can cause this infection.  This condition may be treated with medicines that kill germs.  For very bad pneumonia, you may need a hospital stay and treatment to help with breathing. This information is not intended to replace advice given to you by your health care provider. Make sure you discuss any questions you have with your health care provider. Document Revised: 05/04/2019 Document Reviewed: 05/04/2019 Elsevier Patient Education  2021 [employer]  

## 2020-09-26 NOTE — Progress Notes (Signed)
Inpatient palliative care was consulted to help address goals.  I came by to see patient but he was in process of discharging.  I note that home-based palliative care has been consulted and will follow after discharge from the hospital.

## 2020-09-26 NOTE — Progress Notes (Signed)
Discussed discharge instructions with patient and son including medications and follow up appointments.

## 2020-09-27 ENCOUNTER — Telehealth: Payer: Self-pay | Admitting: Family Medicine

## 2020-09-27 ENCOUNTER — Telehealth: Payer: Self-pay

## 2020-09-27 NOTE — Discharge Summary (Signed)
[facility] NAME: Frank Mcdaniel    MR#:  [id number]  DATE OF BIRTH:  11/26/1932  DATE OF ADMISSION:  09/24/2020   ADMITTING PHYSICIAN: Collier Bullock, MD  DATE OF DISCHARGE: 09/26/2020 10:05 AM  PRIMARY CARE PHYSICIAN: [name], MD   ADMISSION DIAGNOSIS:  Pneumonia [J18.9] Elevated troponin level [R77.8] HCAP (healthcare-associated pneumonia) [J18.9] Hematuria [R31.9] Sepsis ([place]) [A41.9] Severe sepsis ([place]) [A41.9, R65.20] Acute respiratory failure with hypoxemia ([place]) [J96.01] DISCHARGE DIAGNOSIS:  Principal Problem:   Sepsis ([place]) Active Problems:   Hairy cell leukemia ([place])   GERD (gastroesophageal reflux disease)   Dementia without behavioral disturbance (HCC)   CKD (chronic kidney disease), stage III ([place])   Community acquired pneumonia   Pulmonary nodule  SECONDARY DIAGNOSIS:   Past Medical History:  Diagnosis Date  . Anemia 09/01/2015  . Anxiety 09/11/2015  . Arthritis   . Asthma    as a teenager  . Asymptomatic Sinus Bradycardia   . Atypical chest pain    a. 08/2014  . BPH (benign prostatic hyperplasia)   . CKD (chronic kidney disease), stage III ([place])   . Closed fracture of tuft of distal phalanx of finger 08/31/2018  . Community acquired pneumonia 08/10/2020  . Coronary artery disease    a. 2000 s/p CABG;  b. 10/2008 Neg MV, EF 64%; c. cath 05/2015: LM 85% sev cal, pLAD-1 lesion 80%, pLAD-2 lesion 100% chronic, ostLCx 70% sev cal, OM4 90%, ostRCA 70%, mid RCA 90%, LIMA-LAD patent, VG-OM2 patent, VG-OM3 patent, VG-RPDA patent. No AS  . Dementia without behavioral disturbance ([place]) 05/28/2019  . GERD (gastroesophageal reflux disease)   . H/O echocardiogram    a. 10/2005 Echo: nl EF.  [name] Hairy cell leukemia ([place])   . HOH (hard of hearing)    Bilateral hearing aids  . HTN (hypertension)   . Hyperlipidemia   . Hypertension   . Leukemia ([place])   . Neutropenic fever ([place]) 11/08/2019  . Stricture and stenosis of  esophagus    HOSPITAL COURSE:  87 y.o.malewith medical history significant fordementia, anxiety, GERD, hypertension, history of recurrent hairy cell leukemia who recently completed chemotherapy on 02/01/22admitted for worsening confusion  Sepsis present on admission from aspiration pneumonia and or community-acquired pneumonia Chest x-ray and CT confirms right sided consolidation consistent with pneumonia Sepsis resolved and pneumonia improving with antibiotics  Pulmonary nodule Stable and chronic Patient's son is well aware of his and patient was scheduled for repeat scan in April so he may hold off doing so now and wait for another 6 months to get a repeat CT scan. He is planning to coordinate this with patient's outpt doc's  Hypokalemia Repleted  Pancytopenia Due to hairy cell leukemia, outpt f/up with onco  Recurrent hairy cell leukemia Followed by oncology as an outpatient  Coronary artery disease stable  BPH Continue Flomax and finasteride  Dementia Continue Aricept  Chronic kidney disease,stage III A Renal function is stable  Patient's son felt that patient is back to baseline and prefer for him to go home to finish remaining course of Abx.   DISCHARGE CONDITIONS:  stable CONSULTS OBTAINED:   DRUG ALLERGIES:  No Known Allergies DISCHARGE MEDICATIONS:   Allergies as of 09/26/2020   No Known Allergies     Medication List    STOP taking these medications   isosorbide mononitrate 30 MG 24 hr tablet Commonly known as: IMDUR     TAKE these medications   amoxicillin-clavulanate 875-125 MG tablet  Commonly known as: Augmentin Take 1 tablet by mouth every 12 (twelve) hours for 7 days.   atorvastatin 40 MG tablet Commonly known as: LIPITOR TAKE 1/2 TABLETS (20 MG TOTAL) BY MOUTH DAILY. What changed: See the new instructions.   donepezil 10 MG tablet Commonly known as: ARICEPT Take 1 tablet (10 mg total) by mouth at bedtime.   finasteride  5 MG tablet Commonly known as: PROSCAR TAKE 1 TABLET BY MOUTH EVERY DAY   multivitamin tablet Take 1 tablet by mouth daily.   pantoprazole 40 MG tablet Commonly known as: Protonix Take 1 tablet (40 mg total) by mouth daily.   tamsulosin 0.4 MG Caps capsule Commonly known as: FLOMAX TAKE 2 CAPSULES BY MOUTH EVERY DAY What changed:   how much to take  how to take this  when to take this   TUMS CHEWY BITES PO Take 1 tablet by mouth as needed.      DISCHARGE INSTRUCTIONS:   DIET:  Regular diet/Mech Soft w/ cut meats, moistened foods; Thin liquids VIA CUP ONLY.  General aspiration and REFLUX/GERD precautions.  Monitoring at meals for follow through w/ precautions.  Medication Administration: Whole meds with puree (for safer swallowing)  DISCHARGE CONDITION:  Good ACTIVITY:  Activity as tolerated OXYGEN:  Home Oxygen: No.  Oxygen Delivery: room air DISCHARGE LOCATION:  home with Palliative care to follow  If you experience worsening of your admission symptoms, develop shortness of breath, life threatening emergency, suicidal or homicidal thoughts you must seek medical attention immediately by calling 911 or calling your MD immediately  if symptoms less severe.  You Must read complete instructions/literature along with all the possible adverse reactions/side effects for all the Medicines you take and that have been prescribed to you. Take any new Medicines after you have completely understood and accpet all the possible adverse reactions/side effects.   Please note  You were cared for by a hospitalist during your hospital stay. If you have any questions about your discharge medications or the care you received while you were in the hospital after you are discharged, you can call the unit and asked to speak with the hospitalist on call if the hospitalist that took care of you is not available. Once you are discharged, your primary care physician will handle any further  medical issues. Please note that NO REFILLS for any discharge medications will be authorized once you are discharged, as it is imperative that you return to your primary care physician (or establish a relationship with a primary care physician if you do not have one) for your aftercare needs so that they can reassess your need for medications and monitor your lab values.    On the day of Discharge:  VITAL SIGNS:  Blood pressure (!) 165/65, pulse 62, temperature 98 F (36.7 C), temperature source Oral, resp. rate 16, height 5\' 3"  (1.6 m), weight 71.3 kg, SpO2 98 %. PHYSICAL EXAMINATION:  GENERAL:  87 y.o.-year-old patient lying in the bed with no acute distress.  EYES: Pupils equal, round, reactive to light and accommodation. No scleral icterus. Extraocular muscles intact.  HEENT: Head atraumatic, normocephalic. Oropharynx and nasopharynx clear.  NECK:  Supple, no jugular venous distention. No thyroid enlargement, no tenderness.  LUNGS: Normal breath sounds bilaterally, no wheezing, rales,rhonchi or crepitation. No use of accessory muscles of respiration.  CARDIOVASCULAR: S1, S2 normal. No murmurs, rubs, or gallops.  ABDOMEN: Soft, non-tender, non-distended. Bowel sounds present. No organomegaly or mass.  EXTREMITIES: No pedal edema, cyanosis, or  clubbing.  NEUROLOGIC: Cranial nerves II through XII are intact. Muscle strength 5/5 in all extremities. Sensation intact. Gait not checked.  PSYCHIATRIC: The patient is alert and oriented x 3.  SKIN: No obvious rash, lesion, or ulcer.  DATA REVIEW:   CBC Recent Labs  Lab 09/25/20 0627  WBC 3.3*  HGB 8.1*  HCT 23.2*  PLT 98*    Chemistries  Recent Labs  Lab 09/24/20 0542 09/25/20 0436  NA 136 137  K 3.5 3.3*  CL 104 107  CO2 23 24  GLUCOSE 117* 100*  BUN 18 19  CREATININE 1.34* 1.39*  CALCIUM 9.0 8.4*  AST 22  --   ALT 12  --   ALKPHOS 69  --   BILITOT 1.4*  --      Outpatient follow-up  Follow-up Information     [name], MD. Schedule an appointment as soon as possible for a visit in 1 week(s).   Specialty: Family Medicine Contact information: [place] [address] [phone]        [name], MD. Go on 10/13/2020.   Specialty: Cardiology Why: as scheduled Contact information: [place] [address] [phone]        [name], MD. Go on 10/03/2020.   Specialty: Oncology Why: as scheduled Contact information: [place] [address] [phone]               29 Day Unplanned Readmission Risk Score   Flowsheet Row ED to Hosp-Admission (Discharged) from 09/24/2020 in [facility]  30 Day Unplanned Readmission Risk Score (%) 32.38 Filed at 09/26/2020 0801     This score is the patient's risk of an unplanned readmission within 30 days of being discharged (0 -100%). The score is based on dignosis, age, lab data, medications, orders, and past utilization.   Low:  0-14.9   Medium: 15-21.9   High: 22-29.9   Extreme: 30 and above         Management plans discussed with the patient, family and they are in agreement.  CODE STATUS: Prior   TOTAL TIME TAKING CARE OF THIS PATIENT: 45 minutes.    Max Sane M.D on 09/27/2020 at 3:26 PM  [facility]   CC: Primary care physician; [name], MD   Note: This dictation was prepared with Dragon dictation along with smaller phrase technology. Any transcriptional errors that result from this process are unintentional.

## 2020-09-27 NOTE — Telephone Encounter (Signed)
Thank you :)

## 2020-09-27 NOTE — Telephone Encounter (Signed)
Patient called in wanted to cancel the CT scan ans he did not have the phone number to call them can you call them and give them the number [phone]

## 2020-09-27 NOTE — Telephone Encounter (Signed)
No [facility] Follow-up scheduled at this time.   Date of discharge and from where:  09/26/20  Spoke with son regarding hospital follow up, declined. Plans to keep previously scheduled 3 month f/u appointment 11/08/20. Reports patient is at his baseline with no new or ongoing symptoms. Follow up scheduled with Cardiology and Oncology. Encouraged to call the office with any questions or concerns.

## 2020-09-28 ENCOUNTER — Telehealth: Payer: Self-pay

## 2020-09-29 LAB — CULTURE, BLOOD (ROUTINE X 2)
Culture: NO GROWTH
Culture: NO GROWTH

## 2020-09-29 NOTE — Telephone Encounter (Signed)
Is the patient agreeable to this?

## 2020-09-29 NOTE — Telephone Encounter (Signed)
I called the patient and spoke with the son and he stated that the patient has a sitter, I also spoke with the provider who felt the patient did not need palliative care, so the patient agreed and does not want to fill the request.  Charese Abundis,cma

## 2020-09-30 NOTE — Progress Notes (Signed)
[facility]  Telephone:([phone] [phone]  ID: Frank Mcdaniel OB: 1933-05-07  MR#: [id number]  JAS#:[id number]  Patient Care Team: [name], MD as PCP - General (Family Medicine) Rockey Situ Kathlene November, MD as PCP - Cardiology (Cardiology) [name], MD as Consulting Physician (Cardiology) [name], MD as Consulting Physician (Oncology) [name] as Physician Assistant (Urology)  CHIEF COMPLAINT: Recurrent hairy cell leukemia.  INTERVAL HISTORY: Patient returns to clinic today from repeat laboratory in 1 month evaluation after completing cycle 8 of Rituxan.  He was recently admitted to the hospital for several days with fever and possible pneumonia.  He continues to have baseline confusion, but otherwise feels well.  He continues to have chronic weakness and fatigue.  He has a fair appetite, but his weight is stable.  He does not complain of cough or hoarseness today.  He has no neurologic complaints.  He denies any fevers.  He denies any chest pain, shortness of breath, or hemoptysis.  He denies nausea, vomiting, diarrhea or constipation.  He has no urinary complaints.  Patient offers no further specific complaints today.  REVIEW OF SYSTEMS:   Review of Systems  Constitutional: Positive for malaise/fatigue. Negative for fever and weight loss.  HENT: Negative for sore throat.   Respiratory: Negative.  Negative for cough, hemoptysis and shortness of breath.   Cardiovascular: Negative.  Negative for chest pain and leg swelling.  Gastrointestinal: Negative.  Negative for abdominal pain.  Genitourinary: Negative.  Negative for dysuria.  Musculoskeletal: Negative.  Negative for back pain.  Skin: Negative.  Negative for rash.  Neurological: Positive for weakness. Negative for dizziness, focal weakness and headaches.  Psychiatric/Behavioral: Positive for memory loss. The patient is not nervous/anxious.     As per HPI.  Otherwise, a complete review of systems is negative.  PAST MEDICAL HISTORY: Past Medical History:  Diagnosis Date  . Anemia 09/01/2015  . Anxiety 09/11/2015  . Arthritis   . Asthma    as a teenager  . Asymptomatic Sinus Bradycardia   . Atypical chest pain    a. 08/2014  . BPH (benign prostatic hyperplasia)   . CKD (chronic kidney disease), stage III ([place])   . Closed fracture of tuft of distal phalanx of finger 08/31/2018  . Community acquired pneumonia 08/10/2020  . Coronary artery disease    a. 2000 s/p CABG;  b. 10/2008 Neg MV, EF 64%; c. cath 05/2015: LM 85% sev cal, pLAD-1 lesion 80%, pLAD-2 lesion 100% chronic, ostLCx 70% sev cal, OM4 90%, ostRCA 70%, mid RCA 90%, LIMA-LAD patent, VG-OM2 patent, VG-OM3 patent, VG-RPDA patent. No AS  . Dementia without behavioral disturbance ([place]) 05/28/2019  . GERD (gastroesophageal reflux disease)   . H/O echocardiogram    a. 10/2005 Echo: nl EF.  [name] Hairy cell leukemia ([place])   . HOH (hard of hearing)    Bilateral hearing aids  . HTN (hypertension)   . Hyperlipidemia   . Hypertension   . Leukemia ([place])   . Neutropenic fever ([place]) 11/08/2019  . Stricture and stenosis of esophagus     PAST SURGICAL HISTORY: Past Surgical History:  Procedure Laterality Date  . CARDIAC CATHETERIZATION N/A 05/26/2015   Procedure: Left Heart Cath;  Surgeon: [name], MD;  Location: [facility];  Service: Cardiovascular;  Laterality: N/A;  . CARDIAC CATHETERIZATION N/A 05/26/2015   Procedure: Coronary/Graft Angiography;  Surgeon: [name], MD;  Location: [facility];  Service: Cardiovascular;  Laterality: N/A;  . CATARACT EXTRACTION    . CORONARY ARTERY BYPASS GRAFT  2000  . ESOPHAGOGASTRODUODENOSCOPY N/A 02/18/2017   Procedure: ESOPHAGOGASTRODUODENOSCOPY (EGD) with removal of food bolus;  Surgeon: [name], MD;  Location: [facility] ENDOSCOPY;  Service: Endoscopy;  Laterality: N/A;  . ESOPHAGOGASTRODUODENOSCOPY (EGD) WITH PROPOFOL N/A  03/11/2017   Procedure: ESOPHAGOGASTRODUODENOSCOPY (EGD) WITH PROPOFOL;  Surgeon: [name], MD;  Location: [facility] ENDOSCOPY;  Service: Endoscopy;  Laterality: N/A;  . ESOPHAGOGASTRODUODENOSCOPY (EGD) WITH PROPOFOL N/A 04/15/2017   Procedure: ESOPHAGOGASTRODUODENOSCOPY (EGD) WITH PROPOFOL;  Surgeon: [name], MD;  Location: [facility] ENDOSCOPY;  Service: Endoscopy;  Laterality: N/A;  . ESOPHAGOGASTRODUODENOSCOPY (EGD) WITH PROPOFOL N/A 09/12/2020   Procedure: ESOPHAGOGASTRODUODENOSCOPY (EGD) WITH PROPOFOL;  Surgeon: Lin Landsman, MD;  Location: [facility] ENDOSCOPY;  Service: Gastroenterology;  Laterality: N/A;  . EYE SURGERY Left    Cataract Extraction with IOL  . INGUINAL HERNIA REPAIR Left 06/24/2016   Procedure: HERNIA REPAIR INGUINAL ADULT;  Surgeon: [name], MD;  Location: [facility] ORS;  Service: General;  Laterality: Left;  . TONSILLECTOMY  1942   . UPPER GASTROINTESTINAL ENDOSCOPY  3 years ago    with Dilation.     FAMILY HISTORY Family History  Problem Relation Age of Onset  . Hypertension Mother   . Arthritis Father   . Other Other        no premature CAD.  [name] Colon cancer Neg Hx   . Prostate cancer Neg Hx   . Stomach cancer Neg Hx        ADVANCED DIRECTIVES:    HEALTH MAINTENANCE: Social History   Tobacco Use  . Smoking status: Never Smoker  . Smokeless tobacco: Never Used  Vaping Use  . Vaping Use: Never used  Substance Use Topics  . Alcohol use: No  . Drug use: No     No Known Allergies  Current Outpatient Medications  Medication Sig Dispense Refill  . atorvastatin (LIPITOR) 40 MG tablet TAKE 1/2 TABLETS (20 MG TOTAL) BY MOUTH DAILY. (Patient taking differently: Take 20 mg by mouth daily.) 45 tablet 3  . Calcium Carbonate Antacid (TUMS CHEWY BITES PO) Take 1 tablet by mouth as needed.     . donepezil (ARICEPT) 10 MG tablet Take 1 tablet (10 mg total) by mouth at bedtime. 90 tablet 1  . finasteride (PROSCAR) 5 MG tablet TAKE 1 TABLET BY MOUTH EVERY DAY  30 tablet 1  . Multiple Vitamin (MULTIVITAMIN) tablet Take 1 tablet by mouth daily.    . pantoprazole (PROTONIX) 40 MG tablet Take 1 tablet (40 mg total) by mouth daily. 30 tablet 2  . tamsulosin (FLOMAX) 0.4 MG CAPS capsule TAKE 2 CAPSULES BY MOUTH EVERY DAY (Patient taking differently: Take 0.8 mg by mouth daily. TAKE 2 CAPSULES BY MOUTH EVERY DAY) 180 capsule 1   No current facility-administered medications for this visit.    OBJECTIVE: Vitals:   10/03/20 1121  BP: (!) 152/54  Pulse: 96  Resp: 18  Temp: 97.8 F (36.6 C)  SpO2: 100%     Body mass index is 28.43 kg/m.    ECOG FS:1 - Symptomatic but completely ambulatory  General: Well-developed, well-nourished, no acute distress. Eyes: Pink conjunctiva, anicteric sclera. HEENT: Normocephalic, moist mucous membranes. Lungs: No audible wheezing or coughing. Heart: Regular rate and rhythm. Abdomen: Soft, nontender, no obvious distention. Musculoskeletal: No edema, cyanosis, or clubbing. Neuro: Alert, answering all questions appropriately. Cranial nerves grossly intact. Skin: No rashes or petechiae noted. Psych: Normal affect.   LAB  RESULTS:  Lab Results  Component Value Date   NA 142 10/03/2020   K 4.0 10/03/2020   CL 105 10/03/2020   CO2 27 10/03/2020   GLUCOSE 111 (H) 10/03/2020   BUN 14 10/03/2020   CREATININE 1.03 10/03/2020   CALCIUM 9.0 10/03/2020   PROT 6.8 09/24/2020   ALBUMIN 4.1 09/24/2020   AST 22 09/24/2020   ALT 12 09/24/2020   ALKPHOS 69 09/24/2020   BILITOT 1.4 (H) 09/24/2020   GFRNONAA >60 10/03/2020   GFRAA 50 (L) 02/08/2020    Lab Results  Component Value Date   WBC 2.9 (L) 10/03/2020   NEUTROABS 2.4 10/03/2020   HGB 8.9 (L) 10/03/2020   HCT 27.0 (L) 10/03/2020   MCV 108.9 (H) 10/03/2020   PLT 150 10/03/2020     STUDIES: CT CHEST ABDOMEN PELVIS W CONTRAST  Result Date: 09/24/2020 CLINICAL DATA:  Sepsis.  Question pneumonia or urinary obstruction. EXAM: CT CHEST, ABDOMEN, AND PELVIS  WITH CONTRAST TECHNIQUE: Multidetector CT imaging of the chest, abdomen and pelvis was performed following the standard protocol during bolus administration of intravenous contrast. CONTRAST:  146m OMNIPAQUE IOHEXOL 300 MG/ML  SOLN COMPARISON:  CTA chest 11/08/2019. FINDINGS: CT CHEST FINDINGS Cardiovascular: The heart size is normal. No substantial pericardial effusion. Coronary artery calcification is evident. Atherosclerotic calcification is noted in the wall of the thoracic aorta. Mediastinum/Nodes: No mediastinal lymphadenopathy. No evidence for gross hilar lymphadenopathy although assessment is limited by the lack of intravenous contrast on today's study. Patulous esophagus. There is no axillary lymphadenopathy. Lungs/Pleura: 7 mm nodule identified posterior right upper lobe on 24/4. Calcified granuloma seen in the right upper lobe. There is patchy ground-glass airspace disease in the right upper, middle, and lower lobes, somewhat obscured by breathing motion during image acquisition. Mild collapse/consolidative opacity is noted both posterior lung bases. No pleural effusion. Musculoskeletal: No worrisome lytic or sclerotic osseous abnormality. CT ABDOMEN PELVIS FINDINGS Hepatobiliary: Scattered small hypodensities in the liver parenchyma are too small to characterize but likely benign. Panic calcified gallstones evident. No intrahepatic or extrahepatic biliary dilation. Pancreas: No focal mass lesion. No dilatation of the main duct. No intraparenchymal cyst. No peripancreatic edema. Spleen: No splenomegaly. No focal mass lesion. Adrenals/Urinary Tract: No adrenal nodule or mass. Tiny 7 mm subcapsular low-density interpolar right kidney is too small to characterize but likely benign. No hydronephrosis in either kidney. No renal stone disease. No CT findings to suggest pyelonephritis. No evidence for hydroureter. Prostate gland generates mass-effect on the inferior bladder. Stomach/Bowel: Stomach is  unremarkable. No gastric wall thickening. No evidence of outlet obstruction. Duodenum is normally positioned as is the ligament of Treitz. No small bowel wall thickening. No small bowel dilatation. The terminal ileum is normal. The appendix is not well visualized, but there is no edema or inflammation in the region of the cecum. No gross colonic mass. No colonic wall thickening. Vascular/Lymphatic: There is abdominal aortic atherosclerosis without aneurysm. There is no gastrohepatic or hepatoduodenal ligament lymphadenopathy. No retroperitoneal or mesenteric lymphadenopathy. No pelvic sidewall lymphadenopathy. Reproductive: Prostate gland is enlarged. Other: No intraperitoneal free fluid. Musculoskeletal: No worrisome lytic or sclerotic osseous abnormality. IMPRESSION: 1. Patchy ground-glass airspace disease in the right upper, middle, and lower lobes, somewhat obscured by breathing motion. Imaging features compatible with multifocal pneumonia and atypical etiology would be a consideration. Mild collapse/consolidative opacity is noted both posterior lung bases. 2. 7 mm posterior right upper lobe pulmonary nodule. Non-contrast chest CT at 6-12 months is recommended. If the nodule is stable  at time of repeat CT, then future CT at 18-24 months (from today's scan) is considered optional for low-risk patients, but is recommended for high-risk patients. This recommendation follows the consensus statement: Guidelines for Management of Incidental Pulmonary Nodules Detected on CT Images: From the Fleischner Society 2017; Radiology 2017; 284:228-243. 3. No acute findings in the abdomen or pelvis. 4. Cholelithiasis. 5. Prostatomegaly. 6. Aortic Atherosclerosis (ICD10-I70.0). Electronically Signed   By: Misty Stanley M.D.   On: 09/24/2020 09:31   DG Chest Port 1 View  Result Date: 09/24/2020 CLINICAL DATA:  Questionable sepsis EXAM: PORTABLE CHEST 1 VIEW COMPARISON:  07/23/2020 FINDINGS: Low volume chest with interstitial  prominence and asymmetric hazy density on the right. Normal heart size. Prior CABG. IMPRESSION: 1. Hazy density on the right which could reflect early pneumonia in this setting. 2. Low volume chest. Electronically Signed   By: Monte Fantasia M.D.   On: 09/24/2020 06:32      ONCOLOGY HISTORY:  Patient initially received cladribine in 2006 and then was treated for a relapse in 2011 and again in 2014. Bone marrow biopsy at that time confirmed recurrence of disease with 80-90% hairy cell leukemia noted in his specimen. Patient completed cladribine 0.15 mg/kg on days 1 through 5 along with 8 weekly cycles of Rituxan on April 18, 2015. Bone marrow biopsy results after completion of 8 weeks of Rituxan without evidence of residual disease.  Patient patient underwent a second round of cladribine 0.15 mg/kg on days 1 through 5 along with 8 weekly cycles of Rituxan completing treatment on September 05, 2020. Patient typically has no evidence of disease and peripheral blood.   ASSESSMENT: Recurrent hairy cell leukemia.  PLAN:   1.  Recurrent hairy cell leukemia: See oncology history as above.  Bone marrow biopsy on November 02, 2019 revealed recurrent hairy cell leukemia, but percentage of involvement was not reported.  Patient completed clofarabine and Rituxan on September 05, 2020.  Patient's pancytopenia has improved.  Given his advanced age and declining memory, will not repeat bone marrow biopsy to confirm remission.  Return to clinic in 6 weeks for laboratory work only and then in 3 months for laboratory work and further evaluation.   2. Thrombocytopenia: Resolved. 3.  Leukopenia: Mild, but patient's ANC is within normal limits. 4.  Anemia: Chronic and unchanged.  Hemoglobin 8.9 today. 5.  Cardiac disease: Continue follow-up and treatment per Dr. Rockey Situ. 6.  Renal insufficiency: Resolved. 7.  Pneumonia: Patient son reports his last day of antibiotics is today. 8.  Sore throat/cough: Resolved.  Continue  Protonix as prescribed.  Continue follow-up with PCP. 9.  Weight loss: Improved.  Patient expressed understanding and was in agreement with this plan. He also understands that He can call clinic at any time with any questions, concerns, or complaints.    [name], MD 10/04/20 8:21 AM

## 2020-10-02 ENCOUNTER — Other Ambulatory Visit: Payer: Self-pay | Admitting: Cardiovascular Disease

## 2020-10-03 ENCOUNTER — Inpatient Hospital Stay: Payer: PPO | Attending: Oncology

## 2020-10-03 ENCOUNTER — Inpatient Hospital Stay: Payer: PPO | Admitting: Oncology

## 2020-10-03 ENCOUNTER — Encounter: Payer: Self-pay | Admitting: Oncology

## 2020-10-03 VITALS — BP 152/54 | HR 96 | Temp 97.8°F | Resp 18 | Wt 160.5 lb

## 2020-10-03 DIAGNOSIS — R634 Abnormal weight loss: Secondary | ICD-10-CM | POA: Diagnosis not present

## 2020-10-03 DIAGNOSIS — N400 Enlarged prostate without lower urinary tract symptoms: Secondary | ICD-10-CM | POA: Insufficient documentation

## 2020-10-03 DIAGNOSIS — C9140 Hairy cell leukemia not having achieved remission: Secondary | ICD-10-CM | POA: Diagnosis not present

## 2020-10-03 DIAGNOSIS — E785 Hyperlipidemia, unspecified: Secondary | ICD-10-CM | POA: Insufficient documentation

## 2020-10-03 DIAGNOSIS — M1990 Unspecified osteoarthritis, unspecified site: Secondary | ICD-10-CM | POA: Insufficient documentation

## 2020-10-03 DIAGNOSIS — N183 Chronic kidney disease, stage 3 unspecified: Secondary | ICD-10-CM | POA: Diagnosis not present

## 2020-10-03 DIAGNOSIS — Z951 Presence of aortocoronary bypass graft: Secondary | ICD-10-CM | POA: Diagnosis not present

## 2020-10-03 DIAGNOSIS — I129 Hypertensive chronic kidney disease with stage 1 through stage 4 chronic kidney disease, or unspecified chronic kidney disease: Secondary | ICD-10-CM | POA: Diagnosis not present

## 2020-10-03 DIAGNOSIS — R5383 Other fatigue: Secondary | ICD-10-CM | POA: Insufficient documentation

## 2020-10-03 DIAGNOSIS — K219 Gastro-esophageal reflux disease without esophagitis: Secondary | ICD-10-CM | POA: Insufficient documentation

## 2020-10-03 DIAGNOSIS — R531 Weakness: Secondary | ICD-10-CM | POA: Insufficient documentation

## 2020-10-03 DIAGNOSIS — Z79899 Other long term (current) drug therapy: Secondary | ICD-10-CM | POA: Insufficient documentation

## 2020-10-03 DIAGNOSIS — I2510 Atherosclerotic heart disease of native coronary artery without angina pectoris: Secondary | ICD-10-CM | POA: Insufficient documentation

## 2020-10-03 DIAGNOSIS — C9142 Hairy cell leukemia, in relapse: Secondary | ICD-10-CM

## 2020-10-03 DIAGNOSIS — R5381 Other malaise: Secondary | ICD-10-CM | POA: Diagnosis not present

## 2020-10-03 DIAGNOSIS — F0390 Unspecified dementia without behavioral disturbance: Secondary | ICD-10-CM | POA: Insufficient documentation

## 2020-10-03 LAB — CBC WITH DIFFERENTIAL/PLATELET
Abs Immature Granulocytes: 0.03 10*3/uL (ref 0.00–0.07)
Basophils Absolute: 0 10*3/uL (ref 0.0–0.1)
Basophils Relative: 0 %
Eosinophils Absolute: 0.1 10*3/uL (ref 0.0–0.5)
Eosinophils Relative: 5 %
HCT: 27 % — ABNORMAL LOW (ref 39.0–52.0)
Hemoglobin: 8.9 g/dL — ABNORMAL LOW (ref 13.0–17.0)
Immature Granulocytes: 1 %
Lymphocytes Relative: 7 %
Lymphs Abs: 0.2 10*3/uL — ABNORMAL LOW (ref 0.7–4.0)
MCH: 35.9 pg — ABNORMAL HIGH (ref 26.0–34.0)
MCHC: 33 g/dL (ref 30.0–36.0)
MCV: 108.9 fL — ABNORMAL HIGH (ref 80.0–100.0)
Monocytes Absolute: 0.1 10*3/uL (ref 0.1–1.0)
Monocytes Relative: 4 %
Neutro Abs: 2.4 10*3/uL (ref 1.7–7.7)
Neutrophils Relative %: 83 %
Platelets: 150 10*3/uL (ref 150–400)
RBC: 2.48 MIL/uL — ABNORMAL LOW (ref 4.22–5.81)
RDW: 13.7 % (ref 11.5–15.5)
WBC: 2.9 10*3/uL — ABNORMAL LOW (ref 4.0–10.5)
nRBC: 0 % (ref 0.0–0.2)

## 2020-10-03 LAB — BASIC METABOLIC PANEL
Anion gap: 10 (ref 5–15)
BUN: 14 mg/dL (ref 8–23)
CO2: 27 mmol/L (ref 22–32)
Calcium: 9 mg/dL (ref 8.9–10.3)
Chloride: 105 mmol/L (ref 98–111)
Creatinine, Ser: 1.03 mg/dL (ref 0.61–1.24)
GFR, Estimated: >60 mL/min (ref >60)
Glucose, Bld: 111 mg/dL — ABNORMAL HIGH (ref 70–99)
Potassium: 4 mmol/L (ref 3.5–5.1)
Sodium: 142 mmol/L (ref 135–145)

## 2020-10-03 LAB — SAMPLE TO BLOOD BANK

## 2020-10-03 NOTE — Progress Notes (Unsigned)
Patient here for oncology follow-up appointment, expresses no complaints or concerns at this time.    

## 2020-10-04 ENCOUNTER — Telehealth: Payer: Self-pay | Admitting: Oncology

## 2020-10-04 NOTE — Telephone Encounter (Signed)
Pt's son called and requested pt's appointment be pushed back by a week. Appt moved from 5/31 to 6/7. Pt's son stated he is active on MyChart and requested an updated AVS as well.

## 2020-10-06 ENCOUNTER — Ambulatory Visit: Payer: PPO | Admitting: Cardiovascular Disease

## 2020-10-12 NOTE — Progress Notes (Signed)
Cardiology Office Note  Date:  10/13/2020   ID:  Frank Mcdaniel, DOB 1933/07/04, MRN [id number]  PCP:  [name], MD   Chief Complaint  Patient presents with  . 12 month follow up     "doing well." Medications reviewed by the patient's son.      HPI:  87 year old gentleman with a history of  coronary artery disease, bypass in July 2000, hairy cell leukemia,   chronic anemia,  hyperlipidemia.  who presents for routine followup of his coronary artery disease.   LOV 10/2019 Presents today with his son who is his caretaker Had a difficult 2021 Bone marrow biopsy on November 02, 2019 revealed recurrent hairy cell leukemia, but percentage of involvement was not reported.  Patient received his cladribine from July 03, 2020 through July 07, 2020  He received cycle 2 of weekly Rituxan on July 10, 2020.  Treatment was then delayed several weeks secondary to hospital admission.  In hospital,  PNA 09/2020 completed chemotherapy on 02/01/22admitted for worsening confusion imdur held for low pressure  Underwent cycle 8 of 8 of weekly Rituxan. 09/05/2020 Followed by Dr. [name], oncology Had high pressure on ercent visit Back on imdur 30 daily  Stays hydrated No orthostasis sxat home Walks well  Denies any chest pain concerning for angina  Echo 11/2019 reviewed 1. Left ventricular ejection fraction, by estimation, is 60 to 65%. The  left ventricle has normal function. The left ventricle has no regional  wall motion abnormalities. Left ventricular diastolic parameters are  consistent with Grade I diastolic  dysfunction (impaired relaxation).  2. Right ventricular systolic function is normal. The right ventricular  size is normal. Tricuspid regurgitation signal is inadequate for assessing  PA pressure.  3. Frequent ectopy noted.   EKG personally reviewed by myself on todays visit Shows normal sinus rhythm rate 56 bpm right bundle branch block  Other past  medical history Denies any shortness of breath admission to the hospital June 2016 for shortness of breath, had a echocardiogram, stress test Showing no ischemia Diagnosed with asthma exacerbation   slipping on the ice in January 2016, possibly landed on his left arm, had chest pressure, described as a dullness in his left pectoral area. He went to the emergency room and evaluation showed negative cardiac enzymes, otherwise normal lab work including CBC, basic metabolic panel, EKG unchanged, chest x-ray normal. He was discharged home with follow-up today Possibly had one or 2 very short episodes of fleeting low-grade almost in the left chest. Again he wonders if it could be musculoskeletal from recent fall  Last stress test 01/2015 was a lexiscan, ejection fraction 65%, no ischemia noted, no EKG changes   PMH:   has a past medical history of Anemia (09/01/2015), Anxiety (09/11/2015), Arthritis, Asthma, Asymptomatic Sinus Bradycardia, Atypical chest pain, BPH (benign prostatic hyperplasia), CKD (chronic kidney disease), stage III ([place]), Closed fracture of tuft of distal phalanx of finger (08/31/2018), Community acquired pneumonia (08/10/2020), Coronary artery disease, Dementia without behavioral disturbance ([place]) (05/28/2019), GERD (gastroesophageal reflux disease), H/O echocardiogram, Hairy cell leukemia ([place]), HOH (hard of hearing), HTN (hypertension), Hyperlipidemia, Hypertension, Leukemia ([place]), Neutropenic fever ([place]) (11/08/2019), and Stricture and stenosis of esophagus.  PSH:    Past Surgical History:  Procedure Laterality Date  . CARDIAC CATHETERIZATION N/A 05/26/2015   Procedure: Left Heart Cath;  Surgeon: [name], MD;  Location: [place] [facility];  Service: Cardiovascular;  Laterality: N/A;  . CARDIAC CATHETERIZATION N/A 05/26/2015   Procedure: Coronary/Graft Angiography;  Surgeon: [name], MD;  Location: [place] [facility];  Service: Cardiovascular;  Laterality: N/A;  .  CATARACT EXTRACTION    . CORONARY ARTERY BYPASS GRAFT  2000  . ESOPHAGOGASTRODUODENOSCOPY N/A 02/18/2017   Procedure: ESOPHAGOGASTRODUODENOSCOPY (EGD) with removal of food bolus;  Surgeon: [name], MD;  Location: [facility] ENDOSCOPY;  Service: Endoscopy;  Laterality: N/A;  . ESOPHAGOGASTRODUODENOSCOPY (EGD) WITH PROPOFOL N/A 03/11/2017   Procedure: ESOPHAGOGASTRODUODENOSCOPY (EGD) WITH PROPOFOL;  Surgeon: [name], MD;  Location: [facility] ENDOSCOPY;  Service: Endoscopy;  Laterality: N/A;  . ESOPHAGOGASTRODUODENOSCOPY (EGD) WITH PROPOFOL N/A 04/15/2017   Procedure: ESOPHAGOGASTRODUODENOSCOPY (EGD) WITH PROPOFOL;  Surgeon: [name], MD;  Location: [facility] ENDOSCOPY;  Service: Endoscopy;  Laterality: N/A;  . ESOPHAGOGASTRODUODENOSCOPY (EGD) WITH PROPOFOL N/A 09/12/2020   Procedure: ESOPHAGOGASTRODUODENOSCOPY (EGD) WITH PROPOFOL;  Surgeon: Lin Landsman, MD;  Location: [facility] ENDOSCOPY;  Service: Gastroenterology;  Laterality: N/A;  . EYE SURGERY Left    Cataract Extraction with IOL  . INGUINAL HERNIA REPAIR Left 06/24/2016   Procedure: HERNIA REPAIR INGUINAL ADULT;  Surgeon: [name], MD;  Location: [facility] ORS;  Service: General;  Laterality: Left;  . TONSILLECTOMY  1942   . UPPER GASTROINTESTINAL ENDOSCOPY  3 years ago    with Dilation.     Current Outpatient Medications  Medication Sig Dispense Refill  . atorvastatin (LIPITOR) 40 MG tablet TAKE 1/2 TABLETS (20 MG TOTAL) BY MOUTH DAILY. (Patient taking differently: Take 20 mg by mouth daily.) 45 tablet 3  . Calcium Carbonate Antacid (TUMS CHEWY BITES PO) Take 1 tablet by mouth as needed.     . donepezil (ARICEPT) 10 MG tablet Take 1 tablet (10 mg total) by mouth at bedtime. 90 tablet 1  . finasteride (PROSCAR) 5 MG tablet TAKE 1 TABLET BY MOUTH EVERY DAY 30 tablet 1  . Multiple Vitamin (MULTIVITAMIN) tablet Take 1 tablet by mouth daily.    . pantoprazole (PROTONIX) 40 MG tablet Take 1 tablet (40 mg total) by mouth daily. 30 tablet 2  .  tamsulosin (FLOMAX) 0.4 MG CAPS capsule TAKE 2 CAPSULES BY MOUTH EVERY DAY 180 capsule 1   No current facility-administered medications for this visit.     Allergies:   Patient has no known allergies.   Social History:  The patient  reports that he has never smoked. He has never used smokeless tobacco. He reports that he does not drink alcohol and does not use drugs.   Family History:   family history includes Arthritis in his father; Hypertension in his mother; Other in an other family member.    Review of Systems: Review of Systems  Constitutional: Negative.   HENT: Negative.   Respiratory: Negative.   Cardiovascular: Negative.   Gastrointestinal: Negative.   Musculoskeletal: Negative.   Neurological: Negative.   Psychiatric/Behavioral: Negative.   All other systems reviewed and are negative.   PHYSICAL EXAM: VS:  BP 110/60 (BP Location: Left Arm, Patient Position: Sitting, Cuff Size: Normal)   Pulse (!) 56   Ht 5' 8"  (1.727 m)   Wt 158 lb 2 oz (71.7 kg)   SpO2 98%   BMI 24.04 kg/m  , BMI Body mass index is 24.04 kg/m. Constitutional:  oriented to person, place, and time. No distress.  HENT:  Head: Grossly normal Eyes:  no discharge. No scleral icterus.  Neck: No JVD, no carotid bruits  Cardiovascular: Regular rate and rhythm, no murmurs appreciated Pulmonary/Chest: Clear to auscultation bilaterally, no wheezes or rails Abdominal: Soft.  no distension.  no tenderness.  Musculoskeletal: Normal range of motion Neurological:  normal muscle tone. Coordination normal. No atrophy Skin: Skin warm and dry Psychiatric: normal affect, pleasant  Recent Labs: 07/24/2020: Magnesium 2.2 09/24/2020: ALT 12; B Natriuretic Peptide 110.2 10/03/2020: BUN 14; Creatinine, Ser 1.03; Hemoglobin 8.9; Platelets 150; Potassium 4.0; Sodium 142    Lipid Panel Lab Results  Component Value Date   CHOL 131 12/10/2018   HDL 53.90 12/10/2018   LDLCALC 65 12/10/2018   TRIG 59.0 12/10/2018      Wt Readings from Last 3 Encounters:  10/13/20 158 lb 2 oz (71.7 kg)  10/03/20 160 lb 8 oz (72.8 kg)  09/26/20 157 lb 3.2 oz (71.3 kg)      ASSESSMENT AND PLAN:  Pure hypercholesterolemia Lipid panel ordered  Essential hypertension Blood pressure is well controlled on today's visit. No changes made to the medications. Continue Imdur 30, monitor for orthostasis symptoms at home  Coronary artery disease involving native coronary artery of native heart without angina pectoris -  Currently with no symptoms of angina. No further workup at this time. Continue current medication regimen.  Hairy cell leukemia, in remission [facility]) Followed by Dr. [name], Chemotherapy through 2021 into early 2022  S/P CABG (coronary artery bypass graft) Currently with no symptoms of angina. No further workup at this time. Continue current medication regimen.    Total encounter time more than 25 minutes  Greater than 50% was spent in counseling and coordination of care with the patient     Orders Placed This Encounter  Procedures  . EKG 12-Lead     Signed, Esmond Plants, M.D., Ph.D. 10/13/2020  [place], [place]

## 2020-10-13 ENCOUNTER — Other Ambulatory Visit: Payer: Self-pay

## 2020-10-13 ENCOUNTER — Encounter: Payer: Self-pay | Admitting: Cardiovascular Disease

## 2020-10-13 ENCOUNTER — Ambulatory Visit (INDEPENDENT_AMBULATORY_CARE_PROVIDER_SITE_OTHER): Payer: PPO | Admitting: Cardiovascular Disease

## 2020-10-13 VITALS — BP 110/60 | HR 56 | Ht 68.0 in | Wt 158.1 lb

## 2020-10-13 DIAGNOSIS — Z951 Presence of aortocoronary bypass graft: Secondary | ICD-10-CM | POA: Diagnosis not present

## 2020-10-13 DIAGNOSIS — I25118 Atherosclerotic heart disease of native coronary artery with other forms of angina pectoris: Secondary | ICD-10-CM | POA: Diagnosis not present

## 2020-10-13 DIAGNOSIS — R000 Tachycardia, unspecified: Secondary | ICD-10-CM

## 2020-10-13 DIAGNOSIS — Z13220 Encounter for screening for lipoid disorders: Secondary | ICD-10-CM

## 2020-10-13 DIAGNOSIS — E876 Hypokalemia: Secondary | ICD-10-CM | POA: Diagnosis not present

## 2020-10-13 DIAGNOSIS — I10 Essential (primary) hypertension: Secondary | ICD-10-CM | POA: Diagnosis not present

## 2020-10-13 MED ORDER — ISOSORBIDE MONONITRATE ER 30 MG PO TB24: 30.0000 mg | ORAL_TABLET | Freq: Every day | ORAL | 3 refills | Status: DC

## 2020-10-13 MED ORDER — ASPIRIN EC 81 MG PO TBEC: 81.0000 mg | DELAYED_RELEASE_TABLET | Freq: Every day | ORAL | 3 refills | Status: DC

## 2020-10-13 NOTE — Patient Instructions (Addendum)
Medication Instructions:  No changes   Lab work: Lipids (fasting) Nothing to eat or drink after midnight (can have clear liquids)  Walk into medical mall at the check in desk, they will direct you to lab registration, hours for labs are Monday-Friday 07:00am-5:30pm (no appointment necessary)   Testing/Procedures: No new testing needed   Follow-Up:  . You will need a follow up appointment in 12 months  . Providers on your designated Care Team:   . Murray Hodgkins, NP . Christell Faith, PA-C . Marrianne Mood, PA-C

## 2020-10-18 ENCOUNTER — Other Ambulatory Visit: Payer: Self-pay | Admitting: Family Medicine

## 2020-11-06 ENCOUNTER — Other Ambulatory Visit: Payer: Self-pay

## 2020-11-07 ENCOUNTER — Ambulatory Visit: Payer: PPO

## 2020-11-08 ENCOUNTER — Ambulatory Visit (INDEPENDENT_AMBULATORY_CARE_PROVIDER_SITE_OTHER): Payer: PPO | Admitting: Family Medicine

## 2020-11-08 ENCOUNTER — Other Ambulatory Visit: Payer: Self-pay

## 2020-11-08 ENCOUNTER — Encounter: Payer: Self-pay | Admitting: Family Medicine

## 2020-11-08 DIAGNOSIS — E782 Mixed hyperlipidemia: Secondary | ICD-10-CM | POA: Diagnosis not present

## 2020-11-08 DIAGNOSIS — I10 Essential (primary) hypertension: Secondary | ICD-10-CM

## 2020-11-08 DIAGNOSIS — K2100 Gastro-esophageal reflux disease with esophagitis, without bleeding: Secondary | ICD-10-CM

## 2020-11-08 DIAGNOSIS — N401 Enlarged prostate with lower urinary tract symptoms: Secondary | ICD-10-CM

## 2020-11-08 DIAGNOSIS — R351 Nocturia: Secondary | ICD-10-CM | POA: Diagnosis not present

## 2020-11-08 NOTE — Patient Instructions (Signed)
Nice to see you. Please try taking the Protonix 30 minutes before dinner.  If this is not beneficial please let me know. Please monitor for lightheadedness and let us know if this occurs.

## 2020-11-08 NOTE — Assessment & Plan Note (Signed)
Stable.  He will continue Flomax 0.8 mg once daily and Proscar 5 mg once daily.

## 2020-11-08 NOTE — Assessment & Plan Note (Signed)
I suspect his cough is related to reflux that is not causing other symptoms.  Discussed that Protonix is best taken 30 minutes before breakfast though they report that will be difficult.  I encouraged him to take this 30 minutes before dinnertime and see if that is beneficial.

## 2020-11-08 NOTE — Progress Notes (Signed)
[name], MD Phone: [phone]  Frank Mcdaniel is a 87 y.o. male who presents today for f/u.  HYPERTENSION  Disease Monitoring  Home BP Monitoring not checking Chest pain- no    Dyspnea- no Medications  Compliance-  Taking imdur 30 mg daily. Lightheadedness-  no  Edema- no  HYPERLIPIDEMIA Symptoms Chest pain on exertion:  no    Medications: Compliance- taking lipitor Right upper quadrant pain- no  Muscle aches- no  BPH: Strain- no Frequency- yes Urgency- occasional Nocturia- 2x Dysuria- no Emptying bladder- mostly Medication- proscar and flomax His son reports he has seen urology previously and they advised there is nothing else to do.  Cough: The patient's son notes this typically occurs after he eats.  Has been going on 6 or so months.  They were advised to get a tortuous esophagus when he had an endoscopy earlier this year.  The endoscopy reports a benign-appearing stenosis and esophagitis.  He is taking Protonix once daily right at dinnertime.  No reflux symptoms.  No blood in his stool.  No postnasal drip.  Social History   Tobacco Use  Smoking Status Never Smoker  Smokeless Tobacco Never Used    Current Outpatient Medications on File Prior to Visit  Medication Sig Dispense Refill  . aspirin EC 81 MG tablet Take 1 tablet (81 mg total) by mouth daily. Swallow whole. 90 tablet 3  . atorvastatin (LIPITOR) 40 MG tablet TAKE 1/2 TABLETS (20 MG TOTAL) BY MOUTH DAILY. (Patient taking differently: Take 20 mg by mouth daily.) 45 tablet 3  . Calcium Carbonate Antacid (TUMS CHEWY BITES PO) Take 1 tablet by mouth as needed.     . donepezil (ARICEPT) 10 MG tablet Take 1 tablet (10 mg total) by mouth at bedtime. 90 tablet 1  . finasteride (PROSCAR) 5 MG tablet TAKE 1 TABLET BY MOUTH EVERY DAY 30 tablet 1  . isosorbide mononitrate (IMDUR) 30 MG 24 hr tablet Take 1 tablet (30 mg total) by mouth daily. 90 tablet 3  . Multiple Vitamin (MULTIVITAMIN) tablet Take 1 tablet  by mouth daily.    . pantoprazole (PROTONIX) 40 MG tablet Take 1 tablet (40 mg total) by mouth daily. 30 tablet 2  . tamsulosin (FLOMAX) 0.4 MG CAPS capsule TAKE 2 CAPSULES BY MOUTH EVERY DAY 180 capsule 1   No current facility-administered medications on file prior to visit.     ROS see history of present illness  Objective  Physical Exam Vitals:   11/08/20 1603  BP: 99/70  Pulse: (!) 50  Temp: 98.3 F (36.8 C)  SpO2: 99%    BP Readings from Last 3 Encounters:  11/08/20 99/70  10/13/20 110/60  10/03/20 (!) 152/54   Wt Readings from Last 3 Encounters:  11/08/20 159 lb 12.8 oz (72.5 kg)  10/13/20 158 lb 2 oz (71.7 kg)  10/03/20 160 lb 8 oz (72.8 kg)    Physical Exam Constitutional:      General: He is not in acute distress.    Appearance: He is not diaphoretic.  Cardiovascular:     Rate and Rhythm: Normal rate and regular rhythm.     Heart sounds: Normal heart sounds.  Pulmonary:     Effort: Pulmonary effort is normal.     Breath sounds: Normal breath sounds.  Musculoskeletal:     Right lower leg: No edema.     Left lower leg: No edema.  Skin:    General: Skin is warm and dry.  Neurological:  Mental Status: He is alert.      Assessment/Plan: Please see individual problem list.  Problem List Items Addressed This Visit    BPH (benign prostatic hyperplasia) (Chronic)    Stable.  He will continue Flomax 0.8 mg once daily and Proscar 5 mg once daily.      GERD (gastroesophageal reflux disease)    I suspect his cough is related to reflux that is not causing other symptoms.  Discussed that Protonix is best taken 30 minutes before breakfast though they report that will be difficult.  I encouraged him to take this 30 minutes before dinnertime and see if that is beneficial.      HTN (hypertension) (Chronic)    Well-controlled.  He will monitor for lightheadedness.  He will continue Imdur 30 mg once daily.      Hyperlipidemia (Chronic)    He will continue  Lipitor 20 mg once daily.  They will have a lipid panel through cardiology later this week.         This visit occurred during the SARS-CoV-2 public health emergency.  Safety protocols were in place, including screening questions prior to the visit, additional usage of staff PPE, and extensive cleaning of exam room while observing appropriate contact time as indicated for disinfecting solutions.    [name], MD [place]

## 2020-11-08 NOTE — Assessment & Plan Note (Signed)
Well-controlled.  He will monitor for lightheadedness.  He will continue Imdur 30 mg once daily.

## 2020-11-08 NOTE — Assessment & Plan Note (Signed)
He will continue Lipitor 20 mg once daily.  They will have a lipid panel through cardiology later this week.

## 2020-11-10 ENCOUNTER — Other Ambulatory Visit: Payer: Self-pay

## 2020-11-10 ENCOUNTER — Other Ambulatory Visit: Payer: Self-pay | Admitting: Family Medicine

## 2020-11-10 ENCOUNTER — Other Ambulatory Visit
Admission: RE | Admit: 2020-11-10 | Discharge: 2020-11-10 | Disposition: A | Discharge status: Home / Self Care | Payer: PPO | Source: Home / Self Care | Attending: Cardiovascular Disease | Admitting: Cardiovascular Disease

## 2020-11-10 DIAGNOSIS — Z13220 Encounter for screening for lipoid disorders: Secondary | ICD-10-CM | POA: Insufficient documentation

## 2020-11-10 DIAGNOSIS — A419 Sepsis, unspecified organism: Secondary | ICD-10-CM | POA: Diagnosis not present

## 2020-11-10 DIAGNOSIS — I25118 Atherosclerotic heart disease of native coronary artery with other forms of angina pectoris: Secondary | ICD-10-CM

## 2020-11-10 DIAGNOSIS — J189 Pneumonia, unspecified organism: Secondary | ICD-10-CM | POA: Diagnosis not present

## 2020-11-10 LAB — LIPID PANEL
Cholesterol: 129 mg/dL (ref 0–200)
HDL: 61 mg/dL (ref >40)
LDL Cholesterol: 60 mg/dL (ref 0–99)
Total CHOL/HDL Ratio: 2.1 RATIO
Triglycerides: 41 mg/dL (ref <150)
VLDL: 8 mg/dL (ref 0–40)

## 2020-11-13 ENCOUNTER — Other Ambulatory Visit: Payer: Self-pay

## 2020-11-13 ENCOUNTER — Emergency Department: Payer: PPO

## 2020-11-13 ENCOUNTER — Inpatient Hospital Stay
Admission: EM | Admit: 2020-11-13 | Discharge: 2020-11-14 | DRG: 871 | Disposition: A | Discharge status: Home / Self Care | Payer: PPO | Attending: Internal Medicine | Admitting: Internal Medicine

## 2020-11-13 DIAGNOSIS — I248 Other forms of acute ischemic heart disease: Secondary | ICD-10-CM | POA: Diagnosis not present

## 2020-11-13 DIAGNOSIS — E876 Hypokalemia: Secondary | ICD-10-CM | POA: Diagnosis present

## 2020-11-13 DIAGNOSIS — Z20822 Contact with and (suspected) exposure to covid-19: Secondary | ICD-10-CM | POA: Diagnosis present

## 2020-11-13 DIAGNOSIS — N1831 Chronic kidney disease, stage 3a: Secondary | ICD-10-CM | POA: Diagnosis present

## 2020-11-13 DIAGNOSIS — H9190 Unspecified hearing loss, unspecified ear: Secondary | ICD-10-CM | POA: Diagnosis present

## 2020-11-13 DIAGNOSIS — J189 Pneumonia, unspecified organism: Secondary | ICD-10-CM | POA: Diagnosis not present

## 2020-11-13 DIAGNOSIS — J690 Pneumonitis due to inhalation of food and vomit: Secondary | ICD-10-CM | POA: Diagnosis not present

## 2020-11-13 DIAGNOSIS — M1990 Unspecified osteoarthritis, unspecified site: Secondary | ICD-10-CM | POA: Diagnosis present

## 2020-11-13 DIAGNOSIS — W19XXXA Unspecified fall, initial encounter: Secondary | ICD-10-CM | POA: Diagnosis present

## 2020-11-13 DIAGNOSIS — K449 Diaphragmatic hernia without obstruction or gangrene: Secondary | ICD-10-CM | POA: Diagnosis not present

## 2020-11-13 DIAGNOSIS — A419 Sepsis, unspecified organism: Principal | ICD-10-CM | POA: Diagnosis present

## 2020-11-13 DIAGNOSIS — N400 Enlarged prostate without lower urinary tract symptoms: Secondary | ICD-10-CM | POA: Diagnosis present

## 2020-11-13 DIAGNOSIS — Z66 Do not resuscitate: Secondary | ICD-10-CM | POA: Diagnosis present

## 2020-11-13 DIAGNOSIS — Y95 Nosocomial condition: Secondary | ICD-10-CM | POA: Diagnosis present

## 2020-11-13 DIAGNOSIS — F0390 Unspecified dementia without behavioral disturbance: Secondary | ICD-10-CM | POA: Diagnosis present

## 2020-11-13 DIAGNOSIS — R410 Disorientation, unspecified: Secondary | ICD-10-CM | POA: Diagnosis not present

## 2020-11-13 DIAGNOSIS — Z8261 Family history of arthritis: Secondary | ICD-10-CM | POA: Diagnosis not present

## 2020-11-13 DIAGNOSIS — C9141 Hairy cell leukemia, in remission: Secondary | ICD-10-CM | POA: Diagnosis not present

## 2020-11-13 DIAGNOSIS — Z79899 Other long term (current) drug therapy: Secondary | ICD-10-CM | POA: Diagnosis not present

## 2020-11-13 DIAGNOSIS — R0602 Shortness of breath: Secondary | ICD-10-CM | POA: Diagnosis not present

## 2020-11-13 DIAGNOSIS — K219 Gastro-esophageal reflux disease without esophagitis: Secondary | ICD-10-CM | POA: Diagnosis not present

## 2020-11-13 DIAGNOSIS — Z7982 Long term (current) use of aspirin: Secondary | ICD-10-CM

## 2020-11-13 DIAGNOSIS — W1830XA Fall on same level, unspecified, initial encounter: Secondary | ICD-10-CM | POA: Diagnosis present

## 2020-11-13 DIAGNOSIS — C9140 Hairy cell leukemia not having achieved remission: Secondary | ICD-10-CM | POA: Diagnosis not present

## 2020-11-13 DIAGNOSIS — R0902 Hypoxemia: Secondary | ICD-10-CM | POA: Diagnosis not present

## 2020-11-13 DIAGNOSIS — Z951 Presence of aortocoronary bypass graft: Secondary | ICD-10-CM | POA: Diagnosis not present

## 2020-11-13 DIAGNOSIS — R778 Other specified abnormalities of plasma proteins: Secondary | ICD-10-CM | POA: Diagnosis present

## 2020-11-13 DIAGNOSIS — I129 Hypertensive chronic kidney disease with stage 1 through stage 4 chronic kidney disease, or unspecified chronic kidney disease: Secondary | ICD-10-CM | POA: Diagnosis present

## 2020-11-13 DIAGNOSIS — N183 Chronic kidney disease, stage 3 unspecified: Secondary | ICD-10-CM | POA: Diagnosis present

## 2020-11-13 DIAGNOSIS — F419 Anxiety disorder, unspecified: Secondary | ICD-10-CM | POA: Diagnosis present

## 2020-11-13 DIAGNOSIS — I2510 Atherosclerotic heart disease of native coronary artery without angina pectoris: Secondary | ICD-10-CM | POA: Diagnosis not present

## 2020-11-13 DIAGNOSIS — R7989 Other specified abnormal findings of blood chemistry: Secondary | ICD-10-CM | POA: Diagnosis present

## 2020-11-13 DIAGNOSIS — I10 Essential (primary) hypertension: Secondary | ICD-10-CM | POA: Diagnosis present

## 2020-11-13 DIAGNOSIS — E785 Hyperlipidemia, unspecified: Secondary | ICD-10-CM | POA: Diagnosis not present

## 2020-11-13 DIAGNOSIS — C9142 Hairy cell leukemia, in relapse: Secondary | ICD-10-CM | POA: Diagnosis not present

## 2020-11-13 DIAGNOSIS — Z8249 Family history of ischemic heart disease and other diseases of the circulatory system: Secondary | ICD-10-CM

## 2020-11-13 DIAGNOSIS — Z974 Presence of external hearing-aid: Secondary | ICD-10-CM

## 2020-11-13 LAB — CBC WITH DIFFERENTIAL/PLATELET
Abs Immature Granulocytes: 0.02 10*3/uL (ref 0.00–0.07)
Basophils Absolute: 0 10*3/uL (ref 0.0–0.1)
Basophils Relative: 1 %
Eosinophils Absolute: 0 10*3/uL (ref 0.0–0.5)
Eosinophils Relative: 2 %
HCT: 34.4 % — ABNORMAL LOW (ref 39.0–52.0)
Hemoglobin: 12 g/dL — ABNORMAL LOW (ref 13.0–17.0)
Immature Granulocytes: 1 %
Lymphocytes Relative: 4 %
Lymphs Abs: 0.1 10*3/uL — ABNORMAL LOW (ref 0.7–4.0)
MCH: 34.9 pg — ABNORMAL HIGH (ref 26.0–34.0)
MCHC: 34.9 g/dL (ref 30.0–36.0)
MCV: 100 fL (ref 80.0–100.0)
Monocytes Absolute: 0.2 10*3/uL (ref 0.1–1.0)
Monocytes Relative: 10 %
Neutro Abs: 1.4 10*3/uL — ABNORMAL LOW (ref 1.7–7.7)
Neutrophils Relative %: 82 %
Platelets: 160 10*3/uL (ref 150–400)
RBC: 3.44 MIL/uL — ABNORMAL LOW (ref 4.22–5.81)
RDW: 12.5 % (ref 11.5–15.5)
Smear Review: NORMAL
WBC: 1.7 10*3/uL — ABNORMAL LOW (ref 4.0–10.5)
nRBC: 0 % (ref 0.0–0.2)

## 2020-11-13 LAB — RESP PANEL BY RT-PCR (FLU A&B, COVID) ARPGX2
Influenza A by PCR: NEGATIVE
Influenza B by PCR: NEGATIVE
SARS Coronavirus 2 by RT PCR: NEGATIVE

## 2020-11-13 LAB — URINALYSIS, COMPLETE (UACMP) WITH MICROSCOPIC
Bacteria, UA: NONE SEEN
Bilirubin Urine: NEGATIVE
Glucose, UA: NEGATIVE mg/dL
Hgb urine dipstick: NEGATIVE
Ketones, ur: NEGATIVE mg/dL
Leukocytes,Ua: NEGATIVE
Nitrite: NEGATIVE
Protein, ur: NEGATIVE mg/dL
Specific Gravity, Urine: 1.012 (ref 1.005–1.030)
pH: 6 (ref 5.0–8.0)

## 2020-11-13 LAB — COMPREHENSIVE METABOLIC PANEL
ALT: 10 U/L (ref 0–44)
AST: 24 U/L (ref 15–41)
Albumin: 4 g/dL (ref 3.5–5.0)
Alkaline Phosphatase: 55 U/L (ref 38–126)
Anion gap: 7 (ref 5–15)
BUN: 22 mg/dL (ref 8–23)
CO2: 26 mmol/L (ref 22–32)
Calcium: 9.1 mg/dL (ref 8.9–10.3)
Chloride: 105 mmol/L (ref 98–111)
Creatinine, Ser: 1.51 mg/dL — ABNORMAL HIGH (ref 0.61–1.24)
GFR, Estimated: 45 mL/min — ABNORMAL LOW (ref >60)
Glucose, Bld: 106 mg/dL — ABNORMAL HIGH (ref 70–99)
Potassium: 3.6 mmol/L (ref 3.5–5.1)
Sodium: 138 mmol/L (ref 135–145)
Total Bilirubin: 1.2 mg/dL (ref 0.3–1.2)
Total Protein: 6.8 g/dL (ref 6.5–8.1)

## 2020-11-13 LAB — TROPONIN I (HIGH SENSITIVITY)
Troponin I (High Sensitivity): 24 ng/L — ABNORMAL HIGH (ref <18)
Troponin I (High Sensitivity): 28 ng/L — ABNORMAL HIGH (ref <18)
Troponin I (High Sensitivity): 30 ng/L — ABNORMAL HIGH (ref <18)
Troponin I (High Sensitivity): 26 ng/L — ABNORMAL HIGH (ref <18)
Troponin I (High Sensitivity): 22 ng/L — ABNORMAL HIGH (ref <18)

## 2020-11-13 LAB — BRAIN NATRIURETIC PEPTIDE: B Natriuretic Peptide: 92.1 pg/mL (ref 0.0–100.0)

## 2020-11-13 LAB — LACTIC ACID, PLASMA
Lactic Acid, Venous: 1.9 mmol/L (ref 0.5–1.9)
Lactic Acid, Venous: 1.5 mmol/L (ref 0.5–1.9)

## 2020-11-13 LAB — PROCALCITONIN: Procalcitonin: 0.19 ng/mL

## 2020-11-13 LAB — MRSA PCR SCREENING: MRSA by PCR: NEGATIVE

## 2020-11-13 LAB — STREP PNEUMONIAE URINARY ANTIGEN: Strep Pneumo Urinary Antigen: NEGATIVE

## 2020-11-13 MED ORDER — ACETAMINOPHEN 500 MG PO TABS
1000.0000 mg | ORAL_TABLET | Freq: Once | ORAL | Status: AC
  Administered 2020-11-13: 1000 mg via ORAL
  Filled 2020-11-13: qty 2

## 2020-11-13 MED ORDER — SODIUM CHLORIDE 0.9 % IV SOLN
2.0000 g | Freq: Once | INTRAVENOUS | Status: AC
  Administered 2020-11-13: 2 g via INTRAVENOUS
  Filled 2020-11-13: qty 2

## 2020-11-13 MED ORDER — DM-GUAIFENESIN ER 30-600 MG PO TB12: 1.0000 | ORAL_TABLET | Freq: Two times a day (BID) | ORAL | Status: DC | PRN

## 2020-11-13 MED ORDER — FINASTERIDE 5 MG PO TABS
5.0000 mg | ORAL_TABLET | Freq: Every day | ORAL | Status: DC
  Administered 2020-11-13 – 2020-11-14 (×2): 5 mg via ORAL
  Filled 2020-11-13 (×2): qty 1

## 2020-11-13 MED ORDER — HYDRALAZINE HCL 20 MG/ML IJ SOLN: 5.0000 mg | INTRAMUSCULAR | Status: DC | PRN

## 2020-11-13 MED ORDER — ALBUTEROL SULFATE (2.5 MG/3ML) 0.083% IN NEBU: 2.5000 mg | INHALATION_SOLUTION | RESPIRATORY_TRACT | Status: DC | PRN

## 2020-11-13 MED ORDER — PANTOPRAZOLE SODIUM 40 MG PO TBEC
40.0000 mg | DELAYED_RELEASE_TABLET | Freq: Every day | ORAL | Status: DC
  Filled 2020-11-13: qty 1

## 2020-11-13 MED ORDER — SODIUM CHLORIDE 0.9 % IV SOLN: INTRAVENOUS | Status: DC

## 2020-11-13 MED ORDER — ENOXAPARIN SODIUM 40 MG/0.4ML ~~LOC~~ SOLN
40.0000 mg | SUBCUTANEOUS | Status: DC
  Administered 2020-11-13 – 2020-11-14 (×2): 40 mg via SUBCUTANEOUS
  Filled 2020-11-13 (×2): qty 0.4

## 2020-11-13 MED ORDER — ATORVASTATIN CALCIUM 20 MG PO TABS
20.0000 mg | ORAL_TABLET | Freq: Every day | ORAL | Status: DC
  Administered 2020-11-13 – 2020-11-14 (×2): 20 mg via ORAL
  Filled 2020-11-13 (×2): qty 1

## 2020-11-13 MED ORDER — METRONIDAZOLE IN NACL 5-0.79 MG/ML-% IV SOLN
500.0000 mg | Freq: Three times a day (TID) | INTRAVENOUS | Status: DC
  Administered 2020-11-13 – 2020-11-14 (×3): 500 mg via INTRAVENOUS
  Filled 2020-11-13 (×5): qty 100

## 2020-11-13 MED ORDER — SODIUM CHLORIDE 0.9 % IV SOLN
2.0000 g | Freq: Two times a day (BID) | INTRAVENOUS | Status: DC
  Administered 2020-11-13: 2 g via INTRAVENOUS
  Filled 2020-11-13 (×3): qty 2

## 2020-11-13 MED ORDER — TAMSULOSIN HCL 0.4 MG PO CAPS
0.4000 mg | ORAL_CAPSULE | Freq: Every day | ORAL | Status: DC
  Filled 2020-11-13: qty 1

## 2020-11-13 MED ORDER — ISOSORBIDE MONONITRATE ER 30 MG PO TB24
30.0000 mg | ORAL_TABLET | Freq: Every day | ORAL | Status: DC
  Administered 2020-11-14: 30 mg via ORAL
  Filled 2020-11-13 (×2): qty 1

## 2020-11-13 MED ORDER — DONEPEZIL HCL 5 MG PO TABS
10.0000 mg | ORAL_TABLET | Freq: Every day | ORAL | Status: DC
  Administered 2020-11-13: 10 mg via ORAL
  Filled 2020-11-13 (×2): qty 2

## 2020-11-13 MED ORDER — METRONIDAZOLE IN NACL 5-0.79 MG/ML-% IV SOLN
500.0000 mg | Freq: Once | INTRAVENOUS | Status: AC
  Administered 2020-11-13: 500 mg via INTRAVENOUS
  Filled 2020-11-13: qty 100

## 2020-11-13 MED ORDER — ADULT MULTIVITAMIN W/MINERALS CH
1.0000 | ORAL_TABLET | Freq: Every day | ORAL | Status: DC
  Administered 2020-11-13 – 2020-11-14 (×2): 1 via ORAL
  Filled 2020-11-13 (×2): qty 1

## 2020-11-13 MED ORDER — ASPIRIN EC 81 MG PO TBEC
81.0000 mg | DELAYED_RELEASE_TABLET | Freq: Every day | ORAL | Status: DC
  Administered 2020-11-13 – 2020-11-14 (×2): 81 mg via ORAL
  Filled 2020-11-13 (×2): qty 1

## 2020-11-13 MED ORDER — SODIUM CHLORIDE 0.9 % IV BOLUS
1000.0000 mL | Freq: Once | INTRAVENOUS | Status: AC
  Administered 2020-11-13: 1000 mL via INTRAVENOUS

## 2020-11-13 MED ORDER — CALCIUM CARBONATE ANTACID 500 MG PO CHEW: 750.0000 mg | CHEWABLE_TABLET | ORAL | Status: DC | PRN

## 2020-11-13 MED ORDER — VANCOMYCIN HCL IN DEXTROSE 1-5 GM/200ML-% IV SOLN: 1000.0000 mg | Freq: Once | INTRAVENOUS | Status: DC

## 2020-11-13 MED ORDER — VANCOMYCIN HCL 10 G IV SOLR: 1750.0000 mg | INTRAVENOUS | Status: DC

## 2020-11-13 MED ORDER — VANCOMYCIN HCL 10 G IV SOLR
1750.0000 mg | Freq: Once | INTRAVENOUS | Status: DC
  Filled 2020-11-13: qty 1750

## 2020-11-13 MED ORDER — ACETAMINOPHEN 325 MG PO TABS: 650.0000 mg | ORAL_TABLET | Freq: Four times a day (QID) | ORAL | Status: DC | PRN

## 2020-11-13 MED ORDER — VANCOMYCIN HCL 1750 MG/350ML IV SOLN
1750.0000 mg | Freq: Once | INTRAVENOUS | Status: AC
  Administered 2020-11-13: 1750 mg via INTRAVENOUS
  Filled 2020-11-13: qty 350

## 2020-11-13 MED ORDER — ACETAMINOPHEN 650 MG RE SUPP: 650.0000 mg | Freq: Four times a day (QID) | RECTAL | Status: DC | PRN

## 2020-11-13 NOTE — Consult Note (Signed)
Pharmacy Antibiotic Note  Frank Mcdaniel is a 87 y.o. male admitted on 11/13/2020 with sepsis.  Pharmacy has been consulted for Vancomycin and Cefepime dosing. Patient received one time orders of Vancomycin 1750mg  and Cefepime 2g in ED. Patient is also on Flagyl Q8 hours.  Plan: 1) Vancomycin 1750 mg IV Q 48 hrs. Goal AUC 400-550. Expected AUC: 528.9 Expected Css: 10.0 SCr used: 1.51  2) Cefepime 2g IV q12 hours  Will adjust doses as renal function improves   Height: 5\' 7"  (170.2 cm) Weight: 72.6 kg (160 lb) IBW/kg (Calculated) : 66.1  Temp (24hrs), Avg:100.6 F (38.1 C), Min:100.6 F (38.1 C), Max:100.6 F (38.1 C)  Recent Labs  Lab 11/13/20 0643 11/13/20 0825  WBC 1.7*  --   CREATININE 1.51*  --   LATICACIDVEN 1.9 1.5    Estimated Creatinine Clearance: 32.8 mL/min (A) (by C-G formula based on SCr of 1.51 mg/dL (H)).    No Known Allergies  Antimicrobials this admission: Vancomycin 4/11 >>  Cefepime 4/11 >>  Flagyl 4/11 >>  Microbiology results: 4/11 BCx: pending 4/11 UCx: pending  4/11 Sputum: pending  4/11 MRSA PCR: not collected yet  Thank you for allowing pharmacy to be a part of this patient's care.  Rene Gonsoulin A Kamryn Gauthier 11/13/2020 8:59 AM

## 2020-11-13 NOTE — ED Provider Notes (Signed)
[facility] Emergency Department Provider Note  ____________________________________________   Event Date/Time   First MD Initiated Contact with Patient 11/13/20 [phone]     (approximate)  I have reviewed the triage vital signs and the nursing notes.   HISTORY  Chief Complaint Weakness (Pt presenting via EMS from home. Per EMS, pt started coughing around 9pm, has been very weak and had temp 89f at home. Per EMS, pt's son stated last time this happened pt had pneumonia. Pt had an unwitnessed episode "sliding down" in the bathroom tonight. Pt is at baseline per EMS)    HPI Frank Mcdaniel is a 87 y.o. male with CKD, asthma, pancytopenia due to hairy cell leukemia who comes in for cough and weakness.  Patient had a cough that started today, constant, nothing makes better, nothing makes it worse.  Family noted that he was having some chills.  He was in the bathroom when they heard a thump and patient had fallen onto his butt but was still sitting up.  Did not hit his head.  This is very abnormal for him to fall.  Patient had an admission recently for pneumonia and they were worried that he could be getting sick again.  On review of records patient was admitted back in February for community-acquired pneumonia confirmed on CT scan was admitted and started on antibiotics and got better          Past Medical History:  Diagnosis Date  . Anemia 09/01/2015  . Anxiety 09/11/2015  . Arthritis   . Asthma    as a teenager  . Asymptomatic Sinus Bradycardia   . Atypical chest pain    a. 08/2014  . BPH (benign prostatic hyperplasia)   . CKD (chronic kidney disease), stage III ([place])   . Closed fracture of tuft of distal phalanx of finger 08/31/2018  . Community acquired pneumonia 08/10/2020  . Coronary artery disease    a. 2000 s/p CABG;  b. 10/2008 Neg MV, EF 64%; c. cath 05/2015: LM 85% sev cal, pLAD-1 lesion 80%, pLAD-2 lesion 100% chronic, ostLCx 70% sev cal, OM4 90%,  ostRCA 70%, mid RCA 90%, LIMA-LAD patent, VG-OM2 patent, VG-OM3 patent, VG-RPDA patent. No AS  . Dementia without behavioral disturbance ([place]) 05/28/2019  . GERD (gastroesophageal reflux disease)   . H/O echocardiogram    a. 10/2005 Echo: nl EF.  [name] Hairy cell leukemia ([place])   . HOH (hard of hearing)    Bilateral hearing aids  . HTN (hypertension)   . Hyperlipidemia   . Hypertension   . Leukemia ([place])   . Neutropenic fever ([place]) 11/08/2019  . Stricture and stenosis of esophagus     Patient Active Problem List   Diagnosis Date Noted  . Sepsis ([place]) 09/24/2020  . Pulmonary nodule 09/24/2020  . Thickening of esophagus   . UTI (urinary tract infection) 08/10/2020  . Palliative care encounter   . Febrile neutropenia ([place]) 07/18/2020  . CKD (chronic kidney disease), stage III ([place]) 02/28/2020  . Pancytopenia ([place]) 01/07/2020  . BRBPR (bright red blood per rectum) 12/31/2019  . Abnormal EKG 11/23/2019  . IBS (irritable bowel syndrome) 08/02/2019  . Dementia without behavioral disturbance ([place]) 05/28/2019  . Status post inguinal hernia repair 09/13/2016  . Constipation 05/21/2016  . Memory loss or impairment 02/15/2016  . Chronic fatigue 12/21/2015  . Bilateral low back pain without sciatica 12/21/2015  . Anxiety 09/11/2015  . Anemia 09/01/2015  . GERD (gastroesophageal reflux disease) 06/02/2015  . Asthma  01/28/2015  . BPH (benign prostatic hyperplasia) 01/28/2015  . Hairy cell leukemia ([place]) 01/28/2015  . Coronary artery disease   . S/P CABG (coronary artery bypass graft) 11/08/2010  . HTN (hypertension) 11/08/2010  . Bradycardia 11/08/2010  . Hyperlipidemia 10/27/2009    Past Surgical History:  Procedure Laterality Date  . CARDIAC CATHETERIZATION N/A 05/26/2015   Procedure: Left Heart Cath;  Surgeon: [name], MD;  Location: [place] [facility];  Service: Cardiovascular;  Laterality: N/A;  . CARDIAC CATHETERIZATION N/A 05/26/2015   Procedure: Coronary/Graft  Angiography;  Surgeon: [name], MD;  Location: [place] [facility];  Service: Cardiovascular;  Laterality: N/A;  . CATARACT EXTRACTION    . CORONARY ARTERY BYPASS GRAFT  2000  . ESOPHAGOGASTRODUODENOSCOPY N/A 02/18/2017   Procedure: ESOPHAGOGASTRODUODENOSCOPY (EGD) with removal of food bolus;  Surgeon: [name], MD;  Location[facility] ENDOSCOPY;  Service: Endoscopy;  Laterality: N/A;  . ESOPHAGOGASTRODUODENOSCOPY (EGD) WITH PROPOFOL N/A 03/11/2017   Procedure: ESOPHAGOGASTRODUODENOSCOPY (EGD) WITH PROPOFOL;  Surgeon: [name], MD;  Location: [facility] ENDOSCOPY;  Service: Endoscopy;  Laterality: N/A;  . ESOPHAGOGASTRODUODENOSCOPY (EGD) WITH PROPOFOL N/A 04/15/2017   Procedure: ESOPHAGOGASTRODUODENOSCOPY (EGD) WITH PROPOFOL;  Surgeon: [name], MD;  Location: [facility] ENDOSCOPY;  Service: Endoscopy;  Laterality: N/A;  . ESOPHAGOGASTRODUODENOSCOPY (EGD) WITH PROPOFOL N/A 09/12/2020   Procedure: ESOPHAGOGASTRODUODENOSCOPY (EGD) WITH PROPOFOL;  Surgeon: Lin Landsman, MD;  Location: [facility] ENDOSCOPY;  Service: Gastroenterology;  Laterality: N/A;  . EYE SURGERY Left    Cataract Extraction with IOL  . INGUINAL HERNIA REPAIR Left 06/24/2016   Procedure: HERNIA REPAIR INGUINAL ADULT;  Surgeon: [name], MD;  Location: [facility] ORS;  Service: General;  Laterality: Left;  . TONSILLECTOMY  1942   . UPPER GASTROINTESTINAL ENDOSCOPY  3 years ago    with Dilation.     Prior to Admission medications   Medication Sig Start Date End Date Taking? Authorizing Provider  aspirin EC 81 MG tablet Take 1 tablet (81 mg total) by mouth daily. Swallow whole. 10/13/20   [name], MD  atorvastatin (LIPITOR) 40 MG tablet TAKE 1/2 TABLETS (20 MG TOTAL) BY MOUTH DAILY. Patient taking differently: Take 20 mg by mouth daily. 10/26/19   [name], MD  Calcium Carbonate Antacid (TUMS CHEWY BITES PO) Take 1 tablet by mouth as needed.     [provider]  donepezil (ARICEPT) 10 MG tablet  Take 1 tablet (10 mg total) by mouth at bedtime. 06/07/20   [name], MD  finasteride (PROSCAR) 5 MG tablet TAKE 1 TABLET BY MOUTH EVERY DAY 11/10/20   [name], MD  isosorbide mononitrate (IMDUR) 30 MG 24 hr tablet Take 1 tablet (30 mg total) by mouth daily. 10/13/20   [name], MD  Multiple Vitamin (MULTIVITAMIN) tablet Take 1 tablet by mouth daily.    [provider]  pantoprazole (PROTONIX) 40 MG tablet Take 1 tablet (40 mg total) by mouth daily. 09/13/20 11/12/20  [name], MD  tamsulosin (FLOMAX) 0.4 MG CAPS capsule TAKE 2 CAPSULES BY MOUTH EVERY DAY 07/07/20   [name], MD    Allergies Patient has no known allergies.  Family History  Problem Relation Age of Onset  . Hypertension Mother   . Arthritis Father   . Other Other        no premature CAD.  [name] Colon cancer Neg Hx   . Prostate cancer Neg Hx   . Stomach cancer Neg Hx  Social History Social History   Tobacco Use  . Smoking status: Never Smoker  . Smokeless tobacco: Never Used  Vaping Use  . Vaping Use: Never used  Substance Use Topics  . Alcohol use: No  . Drug use: No      Review of Systems Constitutional: +fevers  Eyes: No visual changes. ENT: No sore throat. Cardiovascular: Denies chest pain. Respiratory: Denies shortness of breath. +cough  Gastrointestinal: No abdominal pain.  No nausea, no vomiting.  No diarrhea.  No constipation. Genitourinary: Negative for dysuria. Musculoskeletal: Negative for back pain. Skin: Negative for rash. Neurological: Negative for headaches, focal weakness or numbness. All other ROS negative ____________________________________________   PHYSICAL EXAM:  VITAL SIGNS: ED Triage Vitals [11/13/20 0636]  Enc Vitals Group     BP      Pulse Rate 91     Resp (!) 21     Temp (!) 100.6 F (38.1 C)     Temp Source Oral     SpO2 94 %     Weight      Height      Head Circumference      Peak Flow      Pain Score 0      Pain Loc      Pain Edu?      Excl. in [place]?     Constitutional: Alert and oriented x2 hard of hearing.  Eyes: Conjunctivae are normal. EOMI. Head: Atraumatic. Nose: No congestion/rhinnorhea. Mouth/Throat: Mucous membranes are moist.   Neck: No stridor. Trachea Midline. FROM Cardiovascular: Normal rate, regular rhythm. Grossly normal heart sounds.  Good peripheral circulation. Respiratory: Normal respiratory effort.  No retractions. Lungs CTAB. Gastrointestinal: Soft and nontender. No distention. No abdominal bruits.  Musculoskeletal: No lower extremity tenderness nor edema.  No joint effusions.  Able to lift both legs up off the bed. Neurologic:  Normal speech and language. No gross focal neurologic deficits are appreciated.  Equal strength in hands. Skin:  Skin is warm, dry and intact. No rash noted. Psychiatric: Mood and affect are normal. Speech and behavior are normal. GU: Deferred   ____________________________________________   LABS (all labs ordered are listed, but only abnormal results are displayed)  Labs Reviewed  CBC WITH DIFFERENTIAL/PLATELET - Abnormal; Notable for the following components:      Result Value   WBC 1.7 (*)    RBC 3.44 (*)    Hemoglobin 12.0 (*)    HCT 34.4 (*)    MCH 34.9 (*)    Neutro Abs 1.4 (*)    Lymphs Abs 0.1 (*)    All other components within normal limits  COMPREHENSIVE METABOLIC PANEL - Abnormal; Notable for the following components:   Glucose, Bld 106 (*)    Creatinine, Ser 1.51 (*)    GFR, Estimated 45 (*)    All other components within normal limits  URINALYSIS, COMPLETE (UACMP) WITH MICROSCOPIC - Abnormal; Notable for the following components:   Color, Urine YELLOW (*)    APPearance CLEAR (*)    All other components within normal limits  TROPONIN I (HIGH SENSITIVITY) - Abnormal; Notable for the following components:   Troponin I (High Sensitivity) 24 (*)    All other components within normal limits  RESP PANEL BY RT-PCR (FLU A&B,  COVID) ARPGX2  CULTURE, BLOOD (ROUTINE X 2)  CULTURE, BLOOD (ROUTINE X 2)  URINE CULTURE  LACTIC ACID, PLASMA  PROCALCITONIN  LACTIC ACID, PLASMA   ____________________________________________   ED ECG REPORT I, Vanessa [place],  the attending physician, personally viewed and interpreted this ECG.  Normal sinus rate of 89, no ST elevation but does have a T wave version in V3 with a right bundle branch block ____________________________________________  RADIOLOGY I, Vanessa Pickering, personally viewed and evaluated these images (plain radiographs) as part of my medical decision making, as well as reviewing the written report by the radiologist.  ED MD interpretation:  Pending   Official radiology report(s): No results found.  ____________________________________________   PROCEDURES  Procedure(s) performed (including Critical Care):  .1-3 Lead EKG Interpretation Performed by: Vanessa [place], MD Authorized by: Vanessa [place], MD     Interpretation: normal     ECG rate:  80s    ECG rate assessment: normal     Rhythm: sinus rhythm     Ectopy: none     Conduction: normal   .Critical Care Performed by: Vanessa [place], MD Authorized by: Vanessa [place], MD   Critical care provider statement:    Critical care time (minutes):  45   Critical care was necessary to treat or prevent imminent or life-threatening deterioration of the following conditions:  Sepsis   Critical care was time spent personally by me on the following activities:  Discussions with consultants, evaluation of patient's response to treatment, examination of patient, ordering and performing treatments and interventions, ordering and review of laboratory studies, ordering and review of radiographic studies, pulse oximetry, re-evaluation of patient's condition, obtaining history from patient or surrogate and review of old charts     ____________________________________________   INITIAL IMPRESSION / ASSESSMENT AND  PLAN / ED COURSE  Dreshawn E Pressnell was evaluated in Emergency Department on 11/13/2020 for the symptoms described in the history of present illness. He was evaluated in the context of the global COVID-19 pandemic, which necessitated consideration that the patient might be at risk for infection with the SARS-CoV-2 virus that causes COVID-19. Institutional protocols and algorithms that pertain to the evaluation of patients at risk for COVID-19 are in a state of rapid change based on information released by regulatory bodies including the CDC and federal and state organizations. These policies and algorithms were followed during the patient's care in the ED.    Pt appears septic given HR >90, temperature and elevated respiratory rate.  Unclear source at this time but will get work-up to evaluate for UTI, Covid, chest x-ray to evaluate for pneumonia.  Abdomen is soft and nontender I have low suspicion for acute abdominal infection.  I did call the patient's son who did not witness the fall but found him sitting down on the ground next to the toilet in between the toilet and the wall.  He states that he did not hit his head and he is acting at his mental baseline.  No evidence of fractures on examination.  Sepsis alert was called.  Patient was given 1 L of fluid and started on broad-spectrum antibiotics.  Patient be handed off to oncoming team pending further work-up and most likely admission for sepsis       ____________________________________________   FINAL CLINICAL IMPRESSION(S) / ED DIAGNOSES   Final diagnoses:  Sepsis, due to unspecified organism, unspecified whether acute organ dysfunction present ([place])      MEDICATIONS GIVEN DURING THIS VISIT:  Medications  metroNIDAZOLE (FLAGYL) IVPB 500 mg (has no administration in time range)  vancomycin (VANCOREADY) IVPB 1750 mg/350 mL (has no administration in time range)  acetaminophen (TYLENOL) tablet 1,000 mg (1,000 mg Oral Given 11/13/20  4643)  ceFEPIme (MAXIPIME) 2 g in sodium chloride 0.9 % 100 mL IVPB (2 g Intravenous New Bag/Given 11/13/20 0655)  sodium chloride 0.9 % bolus 1,000 mL (1,000 mLs Intravenous New Bag/Given 11/13/20 1427)     ED Discharge Orders    None       Note:  This document was prepared using Dragon voice recognition software and may include unintentional dictation errors.   Vanessa [place], MD 11/13/20 [phone]

## 2020-11-13 NOTE — ED Notes (Signed)
EDP at bedside  

## 2020-11-13 NOTE — ED Notes (Signed)
Pt to xray

## 2020-11-13 NOTE — H&P (Signed)
History and Physical    Frank Mcdaniel:[id number] DOB: February 05, 1933 DOA: 11/13/2020  Referring MD/NP/PA:   PCP: [name], MD   Patient coming from:  The patient is coming from home.    Chief Complaint: cough, fever and chills  HPI: Frank Mcdaniel is a 87 y.o. male with medical history significant of hypertension, hyperlipidemia, asthma, GERD, anxiety, IBS, C. difficile, pancytopenia, esophageal stricture, Hairy cell leukemia, hard of hearing, dementia, CAD, CABG, CKD stage IIIa, BPH, sinus bradycardia, UTI, pneumonia, who presents with cough, fever and chills.  Per his son, pt's symptoms started yesterday, including dry cough, fever, chills, generalized weakness.  Denies chest pain or SOB.  No nausea, vomiting, diarrhea or abdominal pain.  No symptoms of UTI.  No facial droop or slurred speech.   Per his son, pt had an unwitnessed episode "sliding down" in the bathroom last night. He was in the bathroom when they heard a thump and patient had fallen onto his butt, but was still sitting up. No LOC and no head injury.  Patient strongly denies any head or neck injury.  No headache or neck pain.  ED Course: pt was found to have WBC 1.7, negative Covid PCR, troponin level 24, lactic acid 1.9, slightly worsening renal function, temperature 100.6, blood pressure 124/65, heart rate 82, RR 31, oxygen saturation 97% on room air.  Chest x-ray showed possible right basilar opacity.  Patient is admitted to [place] bed as inpatient  Review of Systems:   General: has fevers, chills, no body weight gain, has fatigue HEENT: no blurry vision, hearing changes or sore throat Respiratory: no dyspnea, has coughing, no wheezing CV: no chest pain, no palpitations GI: no nausea, vomiting, abdominal pain, diarrhea, constipation GU: no dysuria, burning on urination, increased urinary frequency, hematuria  Ext: no leg edema Neuro: no unilateral weakness, numbness, or tingling, no vision change  or hearing loss. Has fall Skin: no rash, no skin tear. MSK: No muscle spasm, no deformity, no limitation of range of movement in spin Heme: No easy bruising.  Travel history: No recent long distant travel.  Allergy: No Known Allergies  Past Medical History:  Diagnosis Date  . Anemia 09/01/2015  . Anxiety 09/11/2015  . Arthritis   . Asthma    as a teenager  . Asymptomatic Sinus Bradycardia   . Atypical chest pain    a. 08/2014  . BPH (benign prostatic hyperplasia)   . CKD (chronic kidney disease), stage III ([place])   . Closed fracture of tuft of distal phalanx of finger 08/31/2018  . Community acquired pneumonia 08/10/2020  . Coronary artery disease    a. 2000 s/p CABG;  b. 10/2008 Neg MV, EF 64%; c. cath 05/2015: LM 85% sev cal, pLAD-1 lesion 80%, pLAD-2 lesion 100% chronic, ostLCx 70% sev cal, OM4 90%, ostRCA 70%, mid RCA 90%, LIMA-LAD patent, VG-OM2 patent, VG-OM3 patent, VG-RPDA patent. No AS  . Dementia without behavioral disturbance ([place]) 05/28/2019  . GERD (gastroesophageal reflux disease)   . H/O echocardiogram    a. 10/2005 Echo: nl EF.  [name] Hairy cell leukemia ([place])   . HOH (hard of hearing)    Bilateral hearing aids  . HTN (hypertension)   . Hyperlipidemia   . Hypertension   . Leukemia ([place])   . Neutropenic fever ([place]) 11/08/2019  . Stricture and stenosis of esophagus     Past Surgical History:  Procedure Laterality Date  . CARDIAC CATHETERIZATION N/A 05/26/2015   Procedure: Left Heart Cath;  Surgeon: [name], MD;  Location: [place];  Service: Cardiovascular;  Laterality: N/A;  . CARDIAC CATHETERIZATION N/A 05/26/2015   Procedure: Coronary/Graft Angiography;  Surgeon: [name], MD;  Location: [facility];  Service: Cardiovascular;  Laterality: N/A;  . CATARACT EXTRACTION    . CORONARY ARTERY BYPASS GRAFT  2000  . ESOPHAGOGASTRODUODENOSCOPY N/A 02/18/2017   Procedure: ESOPHAGOGASTRODUODENOSCOPY (EGD) with removal of food bolus;  Surgeon:  [name], MD;  Location: [facility] ENDOSCOPY;  Service: Endoscopy;  Laterality: N/A;  . ESOPHAGOGASTRODUODENOSCOPY (EGD) WITH PROPOFOL N/A 03/11/2017   Procedure: ESOPHAGOGASTRODUODENOSCOPY (EGD) WITH PROPOFOL;  Surgeon: [name], MD;  Location: [facility] ENDOSCOPY;  Service: Endoscopy;  Laterality: N/A;  . ESOPHAGOGASTRODUODENOSCOPY (EGD) WITH PROPOFOL N/A 04/15/2017   Procedure: ESOPHAGOGASTRODUODENOSCOPY (EGD) WITH PROPOFOL;  Surgeon: [name], MD;  Location: [facility] ENDOSCOPY;  Service: Endoscopy;  Laterality: N/A;  . ESOPHAGOGASTRODUODENOSCOPY (EGD) WITH PROPOFOL N/A 09/12/2020   Procedure: ESOPHAGOGASTRODUODENOSCOPY (EGD) WITH PROPOFOL;  Surgeon: Lin Landsman, MD;  Location: [facility]OSCOPY;  Service: Gastroenterology;  Laterality: N/A;  . EYE SURGERY Left    Cataract Extraction with IOL  . INGUINAL HERNIA REPAIR Left 06/24/2016   Procedure: HERNIA REPAIR INGUINAL ADULT;  Surgeon: [name], MD;  Location: [facility] ORS;  Service: General;  Laterality: Left;  . TONSILLECTOMY  1942   . UPPER GASTROINTESTINAL ENDOSCOPY  3 years ago    with Dilation.     Social History:  reports that he has never smoked. He has never used smokeless tobacco. He reports that he does not drink alcohol and does not use drugs.  Family History:  Family History  Problem Relation Age of Onset  . Hypertension Mother   . Arthritis Father   . Other Other        no premature CAD.  [name] Colon cancer Neg Hx   . Prostate cancer Neg Hx   . Stomach cancer Neg Hx      Prior to Admission medications   Medication Sig Start Date End Date Taking? Authorizing Provider  aspirin EC 81 MG tablet Take 1 tablet (81 mg total) by mouth daily. Swallow whole. 10/13/20   [name], MD  atorvastatin (LIPITOR) 40 MG tablet TAKE 1/2 TABLETS (20 MG TOTAL) BY MOUTH DAILY. Patient taking differently: Take 20 mg by mouth daily. 10/26/19   [name], MD  Calcium Carbonate Antacid (TUMS CHEWY BITES PO) Take 1 tablet by  mouth as needed.     [provider]  donepezil (ARICEPT) 10 MG tablet Take 1 tablet (10 mg total) by mouth at bedtime. 06/07/20   [name], MD  finasteride (PROSCAR) 5 MG tablet TAKE 1 TABLET BY MOUTH EVERY DAY 11/10/20   [name], MD  isosorbide mononitrate (IMDUR) 30 MG 24 hr tablet Take 1 tablet (30 mg total) by mouth daily. 10/13/20   [name], MD  Multiple Vitamin (MULTIVITAMIN) tablet Take 1 tablet by mouth daily.    [provider]  pantoprazole (PROTONIX) 40 MG tablet Take 1 tablet (40 mg total) by mouth daily. 09/13/20 11/12/20  [name], MD  tamsulosin (FLOMAX) 0.4 MG CAPS capsule TAKE 2 CAPSULES BY MOUTH EVERY DAY 07/07/20   [name], MD    Physical Exam: Vitals:   11/13/20 0700 11/13/20 0730 11/13/20 0900 11/13/20 0907  BP: 122/68 124/65 (!) 109/54   Pulse: 85 82 89   Resp: (!) 31 (!) 31 (!) 30   Temp:  98 F (36.7 C)  TempSrc:    Oral  SpO2: 97% 97% 97%   Weight:      Height:       General: Not in acute distress HEENT:       Eyes: PERRL, EOMI, no scleral icterus.       ENT: No discharge from the ears and nose, no pharynx injection, no tonsillar enlargement.        Neck: No JVD, no bruit, no mass felt. Heme: No neck lymph node enlargement. Cardiac: S1/S2, RRR, No murmurs, No gallops or rubs. Respiratory: No rales, wheezing, rhonchi or rubs. GI: Soft, nondistended, nontender, no rebound pain, no organomegaly, BS present. GU: No hematuria Ext: No pitting leg edema bilaterally. 1+DP/PT pulse bilaterally. Musculoskeletal: No joint deformities, No joint redness or warmth, no limitation of ROM in spin. Skin: No rashes.  Neuro: Alert, oriented X3, cranial nerves II-XII grossly intact, moves all extremities normally.  Psych: Patient is not psychotic, no suicidal or hemocidal ideation.  Labs on Admission: I have personally reviewed following labs and imaging studies  CBC: Recent Labs  Lab 11/13/20 0643  WBC  1.7*  NEUTROABS 1.4*  HGB 12.0*  HCT 34.4*  MCV 100.0  PLT 829   Basic Metabolic Panel: Recent Labs  Lab 11/13/20 0643  NA 138  K 3.6  CL 105  CO2 26  GLUCOSE 106*  BUN 22  CREATININE 1.51*  CALCIUM 9.1   GFR: Estimated Creatinine Clearance: 32.8 mL/min (A) (by C-G formula based on SCr of 1.51 mg/dL (H)). Liver Function Tests: Recent Labs  Lab 11/13/20 0643  AST 24  ALT 10  ALKPHOS 55  BILITOT 1.2  PROT 6.8  ALBUMIN 4.0   No results for input(s): LIPASE, AMYLASE in the last 168 hours. No results for input(s): AMMONIA in the last 168 hours. Coagulation Profile: No results for input(s): INR, PROTIME in the last 168 hours. Cardiac Enzymes: No results for input(s): CKTOTAL, CKMB, CKMBINDEX, TROPONINI in the last 168 hours. BNP (last 3 results) No results for input(s): PROBNP in the last 8760 hours. HbA1C: No results for input(s): HGBA1C in the last 72 hours. CBG: No results for input(s): GLUCAP in the last 168 hours. Lipid Profile: No results for input(s): CHOL, HDL, LDLCALC, TRIG, CHOLHDL, LDLDIRECT in the last 72 hours. Thyroid Function Tests: No results for input(s): TSH, T4TOTAL, FREET4, T3FREE, THYROIDAB in the last 72 hours. Anemia Panel: No results for input(s): VITAMINB12, FOLATE, FERRITIN, TIBC, IRON, RETICCTPCT in the last 72 hours. Urine analysis:    Component Value Date/Time   COLORURINE YELLOW (A) 11/13/2020 0652   APPEARANCEUR CLEAR (A) 11/13/2020 0652   APPEARANCEUR Clear 07/16/2018 1452   LABSPEC 1.012 11/13/2020 0652   LABSPEC 1.013 09/01/2014 1142   PHURINE 6.0 11/13/2020 0652   GLUCOSEU NEGATIVE 11/13/2020 0652   GLUCOSEU Negative 09/01/2014 1142   HGBUR NEGATIVE 11/13/2020 0652   BILIRUBINUR NEGATIVE 11/13/2020 0652   BILIRUBINUR Negative 07/16/2018 1452   BILIRUBINUR Negative 09/01/2014 1142   KETONESUR NEGATIVE 11/13/2020 0652   PROTEINUR NEGATIVE 11/13/2020 0652   UROBILINOGEN 0.2 12/21/2015 1132   NITRITE NEGATIVE 11/13/2020  0652   LEUKOCYTESUR NEGATIVE 11/13/2020 0652   LEUKOCYTESUR Negative 09/01/2014 1142   Sepsis Labs: @LABRCNTIP (procalcitonin:4,lacticidven:4) ) Recent Results (from the past 240 hour(s))  Resp Panel by RT-PCR (Flu A&B, Covid) Urine, Clean Catch     Status: None   Collection Time: 11/13/20  6:43 AM   Specimen: Urine, Clean Catch; Nasopharyngeal(NP) swabs in vial transport medium  Result Value Ref Range Status   SARS Coronavirus 2 by RT PCR NEGATIVE NEGATIVE Final    Comment: (NOTE) SARS-CoV-2 target nucleic acids are NOT DETECTED.  The SARS-CoV-2 RNA is generally detectable in upper respiratory specimens during the acute phase of infection. The lowest concentration of SARS-CoV-2 viral copies this assay can detect is 138 copies/mL. A negative result does not preclude SARS-Cov-2 infection and should not be used as the sole basis for treatment or other patient management decisions. A negative result may occur with  improper specimen collection/handling, submission of specimen other than nasopharyngeal swab, presence of viral mutation(s) within the areas targeted by this assay, and inadequate number of viral copies(<138 copies/mL). A negative result must be combined with clinical observations, patient history, and epidemiological information. The expected result is Negative.  Fact Sheet for Patients:  EntrepreneurPulse.com.au  Fact Sheet for Healthcare Providers:  IncredibleEmployment.be  This test is no t yet approved or cleared by the [place] FDA and  has been authorized for detection and/or diagnosis of SARS-CoV-2 by FDA under an Emergency Use Authorization (EUA). This EUA will remain  in effect (meaning this test can be used) for the duration of the COVID-19 declaration under Section 564(b)(1) of the Act, 21 U.S.C.section 360bbb-3(b)(1), unless the authorization is terminated  or revoked sooner.       Influenza A by PCR NEGATIVE  NEGATIVE Final   Influenza B by PCR NEGATIVE NEGATIVE Final    Comment: (NOTE) The Xpert Xpress SARS-CoV-2/FLU/RSV plus assay is intended as an aid in the diagnosis of influenza from Nasopharyngeal swab specimens and should not be used as a sole basis for treatment. Nasal washings and aspirates are unacceptable for Xpert Xpress SARS-CoV-2/FLU/RSV testing.  Fact Sheet for Patients: EntrepreneurPulse.com.au  Fact Sheet for Healthcare Providers: IncredibleEmployment.be  This test is not yet approved or cleared by the [place] FDA and has been authorized for detection and/or diagnosis of SARS-CoV-2 by FDA under an Emergency Use Authorization (EUA). This EUA will remain in effect (meaning this test can be used) for the duration of the COVID-19 declaration under Section 564(b)(1) of the Act, 21 U.S.C. section 360bbb-3(b)(1), unless the authorization is terminated or revoked.  Performed at [facility], [address], [place], [place] [address]      Radiological Exams on Admission: DG Chest 2 View  Result Date: 11/13/2020 CLINICAL DATA:  Shortness of breath. EXAM: CHEST - 2 VIEW COMPARISON:  September 24, 2020. FINDINGS: The heart size and mediastinal contours are within normal limits. Status post coronary artery bypass graft. No pneumothorax or pleural effusion is noted. Right basilar atelectasis or pneumonia is noted. Left lung is clear. The visualized skeletal structures are unremarkable. IMPRESSION: Right basilar atelectasis or pneumonia is noted. Electronically Signed   By: Marijo Conception M.D.   On: 11/13/2020 08:10     EKG: I have personally reviewed.  Sinus rhythm, bifascicular block, nonspecific T wave change  Assessment/Plan Principal Problem:   HCAP (healthcare-associated pneumonia) Active Problems:   Hyperlipidemia   HTN (hypertension)   Coronary artery disease   BPH (benign prostatic hyperplasia)   Hairy cell  leukemia (HCC)   GERD (gastroesophageal reflux disease)   Dementia without behavioral disturbance (HCC)   CKD (chronic kidney disease), stage IIIa   Sepsis (HCC)   Elevated troponin   Fall   Sepsis due to possible HCAP (healthcare-associated pneumonia): Patient meets criteria for sepsis with WBC 1.7, tachypnea with RR 31 and fever 100.6.  Lactic acid 1.9.  Currently hemodynamically stable.  Chest x-ray showed possible right basilar opacity.  Patient has history of esophageal stricture, cannot completely rule out aspiration.  - Will admit to med-surg bed as inpt - IV Vancomycin and cefepime, flagyl - Mucinex for cough  - Bronchodilators - Urine legionella and S. pneumococcal antigen - Follow up blood culture x2, sputum culture - will get Procalcitonin and trend lactic acid level per sepsis protocol - IVF: 1L of NS bolus in ED, followed by 75 mL per hour of NS  - will give dysphagia diet  Hyperlipidemia -Lipitor  HTN (hypertension) -Patient is on Imdur which is for CAD -IV hydralazine as needed  Coronary artery disease and elevated trop: s/p of CABG. Trop 24. No CP.  Possibly due to demand ischemia -Continue aspirin, Lipitor, Imdur  Hairy cell leukemia ([place]): WBC 1.7, Hgb 12.0 and 160 -Follow-up with hematologist  GERD (gastroesophageal reflux disease) -Protonix  Dementia without behavioral disturbance (HCC) -Donepezil  CKD (chronic kidney disease), stage IIIa: Recent baseline creatinine 1.1-1.4.  His creatinine is 1.51, BUN 22, slightly worsening than baseline -IV fluid as above -Avoid using renal toxic medications  Fall: Both patient and his son strongly denies any head or neck injury.  No headache or neck pain.  -PT/OT  BPH: -Flomax and Proscar         DVT ppx:  SQ Lovenox Code Status: DNR per pt and his son Family Communication:    Yes, patient's son at bed side Disposition Plan:  Anticipate discharge back to previous environment Consults called:   none Admission status and Level of care: Med-Surg as inpt         Status is: Inpatient  Remains inpatient appropriate because:Inpatient level of care appropriate due to severity of illness   Dispo: The patient is from: Home              Anticipated d/c is to: Home              Patient currently is not medically stable to d/c.   Difficult to place patient No           Date of Service 11/13/2020    [facility]   If 7PM-7AM, please contact night-coverage [email or web address] 11/13/2020, 9:16 AM

## 2020-11-13 NOTE — Sepsis Progress Note (Signed)
[facility] is following this sepsis 

## 2020-11-13 NOTE — ED Notes (Signed)
Son at bedside.

## 2020-11-13 NOTE — Progress Notes (Signed)
PHARMACY -  BRIEF ANTIBIOTIC NOTE   Pharmacy has received consult(s) for Cefepime and Vancomycin from an ED provider.  The patient's profile has been reviewed for ht/wt/allergies/indication/available labs.    One time order(s) placed for Cefepime 2 gm and Vancomycin 1750 per pt wt: 72.5 kg  Further antibiotics/pharmacy consults should be ordered by admitting physician if indicated.                       Renda Rolls, PharmD, [facility] 11/13/2020 6:51 AM

## 2020-11-13 NOTE — Progress Notes (Signed)
CODE SEPSIS - PHARMACY COMMUNICATION  **Broad Spectrum Antibiotics should be administered within 1 hour of Sepsis diagnosis**  Time Code Sepsis Called/Page Received: 1829  Antibiotics Ordered: Cefepime, Flagyl, Vancomycin  Time of 1st antibiotic administration: 9371   Renda Rolls, PharmD, [facility] 11/13/2020 6:58 AM

## 2020-11-13 NOTE — ED Notes (Signed)
Report given to Reina RN 

## 2020-11-13 NOTE — ED Notes (Signed)
Informed Rn bed assigned [phone]

## 2020-11-14 ENCOUNTER — Inpatient Hospital Stay: Payer: PPO | Attending: Oncology

## 2020-11-14 ENCOUNTER — Other Ambulatory Visit: Payer: Self-pay | Admitting: Family Medicine

## 2020-11-14 ENCOUNTER — Other Ambulatory Visit: Payer: PPO

## 2020-11-14 DIAGNOSIS — C9142 Hairy cell leukemia, in relapse: Secondary | ICD-10-CM | POA: Insufficient documentation

## 2020-11-14 DIAGNOSIS — J189 Pneumonia, unspecified organism: Secondary | ICD-10-CM

## 2020-11-14 LAB — CBC
HCT: 28.2 % — ABNORMAL LOW (ref 39.0–52.0)
Hemoglobin: 9.6 g/dL — ABNORMAL LOW (ref 13.0–17.0)
MCH: 34.3 pg — ABNORMAL HIGH (ref 26.0–34.0)
MCHC: 34 g/dL (ref 30.0–36.0)
MCV: 100.7 fL — ABNORMAL HIGH (ref 80.0–100.0)
Platelets: 96 10*3/uL — ABNORMAL LOW (ref 150–400)
RBC: 2.8 MIL/uL — ABNORMAL LOW (ref 4.22–5.81)
RDW: 12.3 % (ref 11.5–15.5)
WBC: 4 10*3/uL (ref 4.0–10.5)
nRBC: 0 % (ref 0.0–0.2)

## 2020-11-14 LAB — LIPID PANEL
Cholesterol: 103 mg/dL (ref 0–200)
HDL: 60 mg/dL (ref >40)
LDL Cholesterol: 37 mg/dL (ref 0–99)
Total CHOL/HDL Ratio: 1.7 RATIO
Triglycerides: 31 mg/dL (ref <150)
VLDL: 6 mg/dL (ref 0–40)

## 2020-11-14 LAB — LEGIONELLA PNEUMOPHILA SEROGP 1 UR AG: L. pneumophila Serogp 1 Ur Ag: NEGATIVE

## 2020-11-14 LAB — BASIC METABOLIC PANEL
Anion gap: 6 (ref 5–15)
BUN: 21 mg/dL (ref 8–23)
CO2: 22 mmol/L (ref 22–32)
Calcium: 8.4 mg/dL — ABNORMAL LOW (ref 8.9–10.3)
Chloride: 109 mmol/L (ref 98–111)
Creatinine, Ser: 1.19 mg/dL (ref 0.61–1.24)
GFR, Estimated: 59 mL/min — ABNORMAL LOW (ref >60)
Glucose, Bld: 96 mg/dL (ref 70–99)
Potassium: 3.4 mmol/L — ABNORMAL LOW (ref 3.5–5.1)
Sodium: 137 mmol/L (ref 135–145)

## 2020-11-14 LAB — HEMOGLOBIN A1C
Hgb A1c MFr Bld: 4.8 % (ref 4.8–5.6)
Mean Plasma Glucose: 91.06 mg/dL

## 2020-11-14 LAB — URINE CULTURE: Culture: <10000 — AB

## 2020-11-14 LAB — SAMPLE TO BLOOD BANK

## 2020-11-14 MED ORDER — VANCOMYCIN HCL 1000 MG/200ML IV SOLN
1000.0000 mg | INTRAVENOUS | Status: DC
  Filled 2020-11-14: qty 200

## 2020-11-14 MED ORDER — AMOXICILLIN-POT CLAVULANATE 875-125 MG PO TABS
1.0000 | ORAL_TABLET | Freq: Two times a day (BID) | ORAL | Status: DC
  Administered 2020-11-14: 1 via ORAL
  Filled 2020-11-14: qty 1

## 2020-11-14 MED ORDER — TAMSULOSIN HCL 0.4 MG PO CAPS: 0.4000 mg | ORAL_CAPSULE | Freq: Every day | ORAL | Status: DC

## 2020-11-14 MED ORDER — PANTOPRAZOLE SODIUM 40 MG PO TBEC: 40.0000 mg | DELAYED_RELEASE_TABLET | Freq: Every day | ORAL | Status: DC

## 2020-11-14 MED ORDER — FAMOTIDINE 20 MG PO TABS: 20.0000 mg | ORAL_TABLET | Freq: Every day | ORAL | 0 refills | Status: DC

## 2020-11-14 MED ORDER — FAMOTIDINE 20 MG PO TABS
20.0000 mg | ORAL_TABLET | Freq: Every day | ORAL | Status: DC
  Administered 2020-11-14: 20 mg via ORAL
  Filled 2020-11-14: qty 1

## 2020-11-14 MED ORDER — POTASSIUM CHLORIDE 20 MEQ PO PACK
40.0000 meq | PACK | Freq: Once | ORAL | Status: AC
  Administered 2020-11-14: 40 meq via ORAL
  Filled 2020-11-14: qty 2

## 2020-11-14 MED ORDER — AMOXICILLIN-POT CLAVULANATE 875-125 MG PO TABS: 1.0000 | ORAL_TABLET | Freq: Two times a day (BID) | ORAL | 0 refills | Status: DC

## 2020-11-14 NOTE — Progress Notes (Signed)
Frank Mcdaniel to be D/C'd home with son per MD order.  Discussed prescriptions and follow up appointments with the patient. Prescriptions given to patient, medication list explained in detail. Pt verbalized understanding.  Allergies as of 11/14/2020   No Known Allergies      Medication List     TAKE these medications    amoxicillin-clavulanate 875-125 MG tablet Commonly known as: AUGMENTIN Take 1 tablet by mouth every 12 (twelve) hours.   aspirin EC 81 MG tablet Take 1 tablet (81 mg total) by mouth daily. Swallow whole.   atorvastatin 40 MG tablet Commonly known as: LIPITOR TAKE 1/2 TABLETS (20 MG TOTAL) BY MOUTH DAILY. What changed: See the new instructions.   donepezil 10 MG tablet Commonly known as: ARICEPT Take 1 tablet (10 mg total) by mouth at bedtime.   famotidine 20 MG tablet Commonly known as: PEPCID Take 1 tablet (20 mg total) by mouth daily. Start taking on: November 15, 2020   finasteride 5 MG tablet Commonly known as: PROSCAR TAKE 1 TABLET BY MOUTH EVERY DAY   isosorbide mononitrate 30 MG 24 hr tablet Commonly known as: IMDUR Take 1 tablet (30 mg total) by mouth daily.   multivitamin tablet Take 1 tablet by mouth daily.   pantoprazole 40 MG tablet Commonly known as: Protonix Take 1 tablet (40 mg total) by mouth daily.   tamsulosin 0.4 MG Caps capsule Commonly known as: FLOMAX TAKE 2 CAPSULES BY MOUTH EVERY DAY   TUMS CHEWY BITES PO Take 1 tablet by mouth as needed.        Vitals:   11/14/20 0801 11/14/20 1135  BP: (!) 121/54 (!) 100/48  Pulse: (!) 50 (!) 51  Resp: 20   Temp: 98.7 F (37.1 C) 98.6 F (37 C)  SpO2: 98% 98%    Skin clean, dry and intact without evidence of skin break down, no evidence of skin tears noted. IV catheter discontinued intact. Site without signs and symptoms of complications. Dressing and pressure applied. Pt denies pain at this time. No complaints noted.  An After Visit Summary was printed and given to the  patient. Patient escorted via [place], and D/C home via private auto.  [place] C. Deatra Ina

## 2020-11-14 NOTE — Consult Note (Signed)
Pharmacy Antibiotic Note  Frank Mcdaniel is a 87 y.o. male admitted on 11/13/2020 with sepsis.  Pharmacy has been consulted for Vancomycin and Cefepime dosing. Patient received one time orders of Vancomycin 1750mg  and Cefepime 2g in ED. Patient is also on Flagyl Q8 hours.  Plan: 1) Vancomycin 1750 mg loading dose given   Will adjust dosing to vancomycin 1000mg  q24h Goal AUC 400-550. Expected AUC: 490 Expected Css min: 12.8 SCr used: 1.19 (was 1.51)   2) Continue cefepime 2g IV q12 hours  Will adjust doses as renal function improves   Height: 5\' 7"  (170.2 cm) Weight: 72.6 kg (160 lb) IBW/kg (Calculated) : 66.1  Temp (24hrs), Avg:98.4 F (36.9 C), Min:98 F (36.7 C), Max:98.7 F (37.1 C)  Recent Labs  Lab 11/13/20 0643 11/13/20 0825 11/14/20 0445  WBC 1.7*  --   --   CREATININE 1.51*  --  1.19  LATICACIDVEN 1.9 1.5  --     Estimated Creatinine Clearance: 41.7 mL/min (by C-G formula based on SCr of 1.19 mg/dL).    No Known Allergies  Antimicrobials this admission: Vancomycin 4/11 >>  Cefepime 4/11 >>  Flagyl 4/11 >>  Microbiology results: 4/11 BCx: pending 4/11 UCx: pending  4/11 Sputum: pending  4/11 MRSA PCR: not collected yet  Thank you for allowing pharmacy to be a part of this patient's care.  Lu Duffel, PharmD, BCPS Clinical Pharmacist 11/14/2020 7:58 AM

## 2020-11-14 NOTE — TOC Initial Note (Signed)
Transition of Care [facility]) - Initial/Assessment Note    Patient Details  Name: Frank Mcdaniel MRN: [id number] Date of Birth: [birth year]  Transition of Care [facility]) CM/SW Contact:    Frank Mcdaniel Phone Number: 11/14/2020, 11:56 AM  Clinical Narrative:  CSW spoke with patient and his son at bedside regarding his disposition to discharge home today. CSW completed readmission risk prevention and the patient has transportation with his son who he lives with. Frank Mcdaniel is scheduled for a hospital follow up appointment with his primary care doctor Frank Mcdaniel 11/27/20 at 11:30am. Patients son reports his dad has a cancer appointment at 3:15pm this afternoon that they are hoping to make it to today.  CSW will sign off for now as social work intervention is no longer needed. Please consult [place] again if new needs arise.  Expected Discharge Plan: Home/Self Care Barriers to Discharge: No Barriers Identified   Patient Goals and CMS Choice Patient states their goals for this hospitalization and ongoing recovery are:: to return home      Expected Discharge Plan and Services Expected Discharge Plan: Home/Self Care       Living arrangements for the past 2 months: Single Family Home Expected Discharge Date: 11/14/20                                    Prior Living Arrangements/Services Living arrangements for the past 2 months: Single Family Home Lives with:: [employer] Patient language and need for interpreter reviewed:: Yes Do you feel safe going back to the place where you live?: Yes      Need for Family Participation in Patient Care: Yes (Comment) (Pt.s son) Care giver support system in place?: Yes (comment) (Patients son) Current home services: Probation officer Activity/Legal Involvement Pertinent to Current Situation/Hospitalization: No - Comment as needed  Activities of Daily Living Home Assistive Devices/Equipment: None ADL Screening (condition at time of  admission) Patient's cognitive ability adequate to safely complete daily activities?: Yes Is the patient deaf or have difficulty hearing?: Yes Does the patient have difficulty seeing, even when wearing glasses/contacts?: No Does the patient have difficulty concentrating, remembering, or making decisions?: Yes Patient able to express need for assistance with ADLs?: Yes Does the patient have difficulty dressing or bathing?: Yes Independently performs ADLs?: Yes (appropriate for developmental age) Does the patient have difficulty walking or climbing stairs?: Yes Weakness of Legs: Both Weakness of Arms/Hands: Both  Permission Sought/Granted Permission sought to share information with : Case Manager,Family Supports Permission granted to share information with : Yes, Verbal Permission Granted  Share Information with NAME: Frank Mcdaniel     Permission granted to share info w Relationship: son  Permission granted to share info w Contact Information: [phone]  Emotional Assessment Appearance:: Appears stated age Attitude/Demeanor/Rapport: Engaged Affect (typically observed): Pleasant Orientation: : Oriented to Self,Oriented to Place,Oriented to  Time,Oriented to Situation Alcohol / Substance Use: Not Applicable Psych Involvement: No (comment)  Admission diagnosis:  HCAP (healthcare-associated pneumonia) [J18.9] Sepsis, due to unspecified organism, unspecified whether acute organ dysfunction present [facility]) [A41.9] Patient Active Problem List   Diagnosis Date Noted  . HCAP (healthcare-associated pneumonia) 11/13/2020  . Elevated troponin 11/13/2020  . Fall 11/13/2020  . Sepsis ([place]) 09/24/2020  . Pulmonary nodule 09/24/2020  . Thickening of esophagus   . UTI (urinary tract infection) 08/10/2020  . Palliative care encounter   . Febrile neutropenia ([place]) 07/18/2020  . CKD (chronic  kidney disease), stage IIIa 02/28/2020  . Pancytopenia ([place]) 01/07/2020  . BRBPR (bright red blood per  rectum) 12/31/2019  . Abnormal EKG 11/23/2019  . IBS (irritable bowel syndrome) 08/02/2019  . Dementia without behavioral disturbance ([place]) 05/28/2019  . Status post inguinal hernia repair 09/13/2016  . Constipation 05/21/2016  . Memory loss or impairment 02/15/2016  . Chronic fatigue 12/21/2015  . Bilateral low back pain without sciatica 12/21/2015  . Anxiety 09/11/2015  . Anemia 09/01/2015  . GERD (gastroesophageal reflux disease) 06/02/2015  . Asthma 01/28/2015  . BPH (benign prostatic hyperplasia) 01/28/2015  . Hairy cell leukemia ([place]) 01/28/2015  . Coronary artery disease   . S/P CABG (coronary artery bypass graft) 11/08/2010  . HTN (hypertension) 11/08/2010  . Bradycardia 11/08/2010  . Hyperlipidemia 10/27/2009   PCP:  Frank Haven, MD Pharmacy:   [id number] [place], [address] [place] [address] Phone: [phone] Fax: [phone]     Social Determinants of Health (SDOH) Interventions    Readmission Risk Interventions Readmission Risk Prevention Plan 11/14/2020 11/14/2020 11/10/2019  Transportation Screening Complete - Complete  PCP or Specialist Appt within 3-5 Days Complete - Complete  HRI or Home Care Consult Complete - Complete  Social Work Consult for [place] Planning/Counseling Complete - -  Palliative Care Screening Not Applicable Not Applicable -  Medication Review (RN Care Manager) Complete - Complete  Some recent data might be hidden

## 2020-11-14 NOTE — Evaluation (Signed)
Physical Therapy Evaluation Patient Details Name: Frank Mcdaniel MRN: [id number] DOB: 1933/02/11 Today's Date: 11/14/2020   History of Present Illness  Frank Mcdaniel is a 87 y.o. male with medical history significant of hypertension, hyperlipidemia, asthma, GERD, anxiety, IBS, C. difficile, pancytopenia, esophageal stricture, Hairy cell leukemia, hard of hearing, dementia, CAD, CABG, CKD stage IIIa, BPH, sinus bradycardia, UTI, pneumonia, who presents with cough, fever and chills.  Clinical Impression  Patient received in bed, son present at bedside. Agrees to PT session in hopes to go home today. Patient is mod independent with bed mobility and transfers, ambulated with RW and min guard 200 feet. Patient reports no difficulties with this and no lob or deficits noted, however he does not use RW at baseline. Patient will continue to benefit from skilled PT while here to improve strength and independence.      Follow Up Recommendations No PT follow up    Equipment Recommendations  None recommended by PT    Recommendations for Other Services       Precautions / Restrictions Precautions Precautions: Fall Restrictions Weight Bearing Restrictions: No      Mobility  Bed Mobility Overal bed mobility: Modified Independent             General bed mobility comments: increased time and cues needed    Transfers Overall transfer level: Modified independent Equipment used: Rolling walker (2 wheeled)                Ambulation/Gait Ambulation/Gait assistance: Min guard Gait Distance (Feet): 200 Feet Assistive device: Rolling walker (2 wheeled) Gait Pattern/deviations: Step-through pattern Gait velocity: WNL   General Gait Details: good ambulation, no lob, cues for direction only  Stairs            Wheelchair Mobility    Modified Rankin (Stroke Patients Only)       Balance Overall balance assessment: Modified Independent                                            Pertinent Vitals/Pain Pain Assessment: No/denies pain    Home Living Family/patient expects to be discharged to:: Private residence Living Arrangements: Children Available Help at Discharge: Family;Personal care attendant;Available 24 hours/day Type of Home: House Home Access: Stairs to enter Entrance Stairs-Rails: Can reach both;Left;Right Entrance Stairs-Number of Steps: 3 Home Layout: Laundry or work area in basement;Two level;Able to live on main level with bedroom/bathroom Home Equipment: Walker - 2 wheels Additional Comments: has walker but did not have to use prior to admission    Prior Function Level of Independence: Independent         Comments: Indep with household/community mobilization. Has PCA that is there with him in am and stays until his son returns from work.     Hand Dominance        Extremity/Trunk Assessment   Upper Extremity Assessment Upper Extremity Assessment: Defer to OT evaluation    Lower Extremity Assessment Lower Extremity Assessment: Overall WFL for tasks assessed    Cervical / Trunk Assessment Cervical / Trunk Assessment: Normal  Communication   Communication: HOH  Cognition Arousal/Alertness: Awake/alert Behavior During Therapy: WFL for tasks assessed/performed Overall Cognitive Status: Within Functional Limits for tasks assessed  General Comments      Exercises     Assessment/Plan    PT Assessment Patient needs continued PT services  PT Problem List Decreased strength;Decreased mobility       PT Treatment Interventions Gait training;Therapeutic exercise;Stair training;Functional mobility training;Therapeutic activities;Patient/family education    PT Goals (Current goals can be found in the Care Plan section)  Acute Rehab PT Goals Patient Stated Goal: to return home today hopefully PT Goal Formulation: With patient/family Time For Goal  Achievement: 11/18/20 Potential to Achieve Goals: Good    Frequency Min 2X/week   Barriers to discharge        Co-evaluation               AM-PAC PT "6 Clicks" Mobility  Outcome Measure Help needed turning from your back to your side while in a flat bed without using bedrails?: None Help needed moving from lying on your back to sitting on the side of a flat bed without using bedrails?: A Little Help needed moving to and from a bed to a chair (including a wheelchair)?: A Little Help needed standing up from a chair using your arms (e.g., wheelchair or bedside chair)?: A Little Help needed to walk in hospital room?: A Little Help needed climbing 3-5 steps with a railing? : A Little 6 Click Score: 19    End of Session   Activity Tolerance: Patient tolerated treatment well Patient left: in bed;with call bell/phone within reach;with family/visitor present Nurse Communication: Mobility status PT Visit Diagnosis: Muscle weakness (generalized) (M62.81)    Time: 1224-8250 PT Time Calculation (min) (ACUTE ONLY): 17 min   Charges:   PT Evaluation $PT Eval Moderate Complexity: 1 Mod PT Treatments $Gait Training: 8-22 mins        Noretta Frier, PT, GCS 11/14/20,9:50 AM

## 2020-11-14 NOTE — Discharge Summary (Signed)
Physician Discharge Summary  Frank Mcdaniel ZOX:[id number] DOB: [birth year] DOA: 11/13/2020  PCP: [name], MD  Admit date: 11/13/2020 Discharge date: 11/14/2020  Admitted From: home Discharge disposition: home   Patient improved faster than anticipated  Recommendations for Outpatient Follow-Up:   1. Added H2 blocker to PPI as having reflux issues and probably aspiration 2. Hiatal hernia precautions 3. Monitor HR-- consider d/c of aricept if still bradycardic 4. outpateint CT Scan to ensure resolution of pna   Discharge Diagnosis:   Principal Problem:   HCAP (healthcare-associated pneumonia) Active Problems:   Hyperlipidemia   HTN (hypertension)   Coronary artery disease   BPH (benign prostatic hyperplasia)   Hairy cell leukemia (H[facility])   GERD (gastroesophageal reflux disease)   Dementia without behavioral disturbance (HCC)   CKD (chronic kidney disease), stage IIIa   Sepsis ([place])   Elevated troponin   Fall    Discharge Condition: Improved.  Diet recommendation: DYS  Wound care: None.  Code status: dnr   History of Present Illness:   Frank Mcdaniel is a 87 y.o. male with medical history significant of hypertension, hyperlipidemia, asthma, GERD, anxiety, IBS, C. difficile, pancytopenia, esophageal stricture, Hairy cell leukemia, hard of hearing, dementia, CAD, CABG, CKD stage IIIa, BPH, sinus bradycardia, UTI, pneumonia, who presents with cough, fever and chills.  Per his son, pt's symptoms started yesterday, including dry cough, fever, chills, generalized weakness.  Denies chest pain or SOB.  No nausea, vomiting, diarrhea or abdominal pain.  No symptoms of UTI.  No facial droop or slurred speech.   Per his son, pt had an unwitnessed episode "sliding down" in the bathroom last night. He was in the bathroom when they heard a thump and patient had fallen onto his butt, but was still sitting up. No LOC and no head injury.  Patient strongly  denies any head or neck injury.  No headache or neck pain.   Hospital Course by Problem:   Sepsis due to aspiration pna -resolved -patient actually improved faster than expected -has acid reflux as well as hiatal hernia-- precautions/foods to avoid given to son -finish PO abx course  Hypokalemia -repleted  Hyperlipidemia -Lipitor  Coronary artery disease and elevated trop: s/p of CABG. Trop 24. No CP.  Possibly due to demand ischemia -Continue aspirin, Lipitor, Imdur  Hairy cell leukemia ([employer]): WBC 1.7, Hgb 12.0 and 160 -Follow-up with hematologist  GERD (gastroesophageal reflux disease) -Protonix  Dementia without behavioral disturbance (HCC) -Donepezil  CKD (chronic kidney disease), stage IIIa: Recent baseline creatinine 1.1-1.4.  His creatinine is 1.51, BUN 22, slightly worsening than baseline -resolved  Fall: Both patient and his son strongly denies any head or neck injury.  No headache or neck pain.  -PT/OT- no follow up  BPH: -Flomax and Proscar      Medical Consultants:      Discharge Exam:   Vitals:   11/14/20 0516 11/14/20 0801  BP: (!) 116/47 (!) 121/54  Pulse: (!) 55 (!) 50  Resp: 18 20  Temp: 98.7 F (37.1 C) 98.7 F (37.1 C)  SpO2: 97% 98%   Vitals:   11/13/20 2240 11/13/20 2351 11/14/20 0516 11/14/20 0801  BP: (!) 115/53 (!) 131/53 (!) 116/47 (!) 121/54  Pulse: (!) 57 (!) 54 (!) 55 (!) 50  Resp: 18 20 18 20   Temp: 98.7 F (37.1 C) 98.7 F (37.1 C) 98.7 F (37.1 C) 98.7 F (37.1 C)  TempSrc: Oral Oral Oral   SpO2: 96%  97% 97% 98%  Weight:      Height:        General exam: Appears calm and comfortable.    The results of significant diagnostics from this hospitalization (including imaging, microbiology, ancillary and laboratory) are listed below for reference.     Procedures and Diagnostic Studies:   DG Chest 2 View  Result Date: 11/13/2020 CLINICAL DATA:  Shortness of breath. EXAM: CHEST - 2 VIEW COMPARISON:   September 24, 2020. FINDINGS: The heart size and mediastinal contours are within normal limits. Status post coronary artery bypass graft. No pneumothorax or pleural effusion is noted. Right basilar atelectasis or pneumonia is noted. Left lung is clear. The visualized skeletal structures are unremarkable. IMPRESSION: Right basilar atelectasis or pneumonia is noted. Electronically Signed   By: Marijo Conception M.D.   On: 11/13/2020 08:10     Labs:   Basic Metabolic Panel: Recent Labs  Lab 11/13/20 0643 11/14/20 0445  NA 138 137  K 3.6 3.4*  CL 105 109  CO2 26 22  GLUCOSE 106* 96  BUN 22 21  CREATININE 1.51* 1.19  CALCIUM 9.1 8.4*   GFR Estimated Creatinine Clearance: 41.7 mL/min (by C-G formula based on SCr of 1.19 mg/dL). Liver Function Tests: Recent Labs  Lab 11/13/20 0643  AST 24  ALT 10  ALKPHOS 55  BILITOT 1.2  PROT 6.8  ALBUMIN 4.0   No results for input(s): LIPASE, AMYLASE in the last 168 hours. No results for input(s): AMMONIA in the last 168 hours. Coagulation profile No results for input(s): INR, PROTIME in the last 168 hours.  CBC: Recent Labs  Lab 11/13/20 0643 11/14/20 0445  WBC 1.7* 4.0  NEUTROABS 1.4*  --   HGB 12.0* 9.6*  HCT 34.4* 28.2*  MCV 100.0 100.7*  PLT 160 96*   Cardiac Enzymes: No results for input(s): CKTOTAL, CKMB, CKMBINDEX, TROPONINI in the last 168 hours. BNP: Invalid input(s): POCBNP CBG: No results for input(s): GLUCAP in the last 168 hours. D-Dimer No results for input(s): DDIMER in the last 72 hours. Hgb A1c No results for input(s): HGBA1C in the last 72 hours. Lipid Profile Recent Labs    11/14/20 0445  CHOL 103  HDL 60  LDLCALC 37  TRIG 31  CHOLHDL 1.7   Thyroid function studies No results for input(s): TSH, T4TOTAL, T3FREE, THYROIDAB in the last 72 hours.  Invalid input(s): FREET3 Anemia work up No results for input(s): VITAMINB12, FOLATE, FERRITIN, TIBC, IRON, RETICCTPCT in the last 72  hours. Microbiology Recent Results (from the past 240 hour(s))  Resp Panel by RT-PCR (Flu A&B, Covid) Urine, Clean Catch     Status: None   Collection Time: 11/13/20  6:43 AM   Specimen: Urine, Clean Catch; Nasopharyngeal(NP) swabs in vial transport medium  Result Value Ref Range Status   SARS Coronavirus 2 by RT PCR NEGATIVE NEGATIVE Final    Comment: (NOTE) SARS-CoV-2 target nucleic acids are NOT DETECTED.  The SARS-CoV-2 RNA is generally detectable in upper respiratory specimens during the acute phase of infection. The lowest concentration of SARS-CoV-2 viral copies this assay can detect is 138 copies/mL. A negative result does not preclude SARS-Cov-2 infection and should not be used as the sole basis for treatment or other patient management decisions. A negative result may occur with  improper specimen collection/handling, submission of specimen other than nasopharyngeal swab, presence of viral mutation(s) within the areas targeted by this assay, and inadequate number of viral copies(<138 copies/mL). A negative result must be  combined with clinical observations, patient history, and epidemiological information. The expected result is Negative.  Fact Sheet for Patients:  EntrepreneurPulse.com.au  Fact Sheet for Healthcare Providers:  IncredibleEmployment.be  This test is no t yet approved or cleared by the [place] FDA and  has been authorized for detection and/or diagnosis of SARS-CoV-2 by FDA under an Emergency Use Authorization (EUA). This EUA will remain  in effect (meaning this test can be used) for the duration of the COVID-19 declaration under Section 564(b)(1) of the Act, 21 U.S.C.section 360bbb-3(b)(1), unless the authorization is terminated  or revoked sooner.       Influenza A by PCR NEGATIVE NEGATIVE Final   Influenza B by PCR NEGATIVE NEGATIVE Final    Comment: (NOTE) The Xpert Xpress SARS-CoV-2/FLU/RSV plus assay is  intended as an aid in the diagnosis of influenza from Nasopharyngeal swab specimens and should not be used as a sole basis for treatment. Nasal washings and aspirates are unacceptable for Xpert Xpress SARS-CoV-2/FLU/RSV testing.  Fact Sheet for Patients: EntrepreneurPulse.com.au  Fact Sheet for Healthcare Providers: IncredibleEmployment.be  This test is not yet approved or cleared by the [place] FDA and has been authorized for detection and/or diagnosis of SARS-CoV-2 by FDA under an Emergency Use Authorization (EUA). This EUA will remain in effect (meaning this test can be used) for the duration of the COVID-19 declaration under Section 564(b)(1) of the Act, 21 U.S.C. section 360bbb-3(b)(1), unless the authorization is terminated or revoked.  Performed at [facility], [address] [place], [place] [address]   Blood culture (routine x 2)     Status: None (Preliminary result)   Collection Time: 11/13/20  6:43 AM   Specimen: BLOOD  Result Value Ref Range Status   Specimen Description BLOOD LEFT AC  Final   Special Requests   Final    BOTTLES DRAWN AEROBIC AND ANAEROBIC Blood Culture adequate volume   Culture   Final    NO GROWTH < 24 HOURS Performed at [facility], [address] [place], [place] [address]    Report Status PENDING  Incomplete  Blood culture (routine x 2)     Status: None (Preliminary result)   Collection Time: 11/13/20  6:43 AM   Specimen: BLOOD  Result Value Ref Range Status   Specimen Description BLOOD RIGHT [facility]  Final   Special Requests   Final    BOTTLES DRAWN AEROBIC AND ANAEROBIC Blood Culture results may not be optimal due to an excessive volume of blood received in culture bottles   Culture   Final    NO GROWTH < 24 HOURS Performed at [facility], [address], [place], [place] [address]    Report Status PENDING  Incomplete  Urine culture     Status: Abnormal    Collection Time: 11/13/20  6:52 AM   Specimen: Urine, Random  Result Value Ref Range Status   Specimen Description   Final    URINE, RANDOM Performed at [facility], [address], [place], [place] [address]    Special Requests   Final    NONE Performed at [facility], [address], [place], [place] [address]    Culture (A)  Final    <10,000 COLONIES/mL INSIGNIFICANT GROWTH Performed at [facility], [place] [address], [place], [place] [address]    Report Status 11/14/2020 FINAL  Final  MRSA PCR Screening     Status: None   Collection Time: 11/13/20  3:56 PM  Specimen: Nasal Mucosa; Nasopharyngeal  Result Value Ref Range Status   MRSA by PCR NEGATIVE NEGATIVE Final    Comment:        The GeneXpert MRSA Assay (FDA approved for NASAL specimens only), is one component of a comprehensive MRSA colonization surveillance program. It is not intended to diagnose MRSA infection nor to guide or monitor treatment for MRSA infections. Performed at [facility], [address] [place], [place] [address]      Discharge Instructions:   Discharge Instructions    Discharge instructions   Complete by: As directed    DYS 2 diet See attached info regarding GERD/hiatal hernia   Increase activity slowly   Complete by: As directed      Allergies as of 11/14/2020   No Known Allergies     Medication List    TAKE these medications   amoxicillin-clavulanate 875-125 MG tablet Commonly known as: AUGMENTIN Take 1 tablet by mouth every 12 (twelve) hours.   aspirin EC 81 MG tablet Take 1 tablet (81 mg total) by mouth daily. Swallow whole.   atorvastatin 40 MG tablet Commonly known as: LIPITOR TAKE 1/2 TABLETS (20 MG TOTAL) BY MOUTH DAILY. What changed: See the new instructions.   donepezil 10 MG tablet Commonly known as: ARICEPT Take 1 tablet (10 mg total) by mouth at bedtime.   famotidine 20 MG tablet Commonly known as: PEPCID Take 1  tablet (20 mg total) by mouth daily. Start taking on: November 15, 2020   finasteride 5 MG tablet Commonly known as: PROSCAR TAKE 1 TABLET BY MOUTH EVERY DAY   isosorbide mononitrate 30 MG 24 hr tablet Commonly known as: IMDUR Take 1 tablet (30 mg total) by mouth daily.   multivitamin tablet Take 1 tablet by mouth daily.   pantoprazole 40 MG tablet Commonly known as: Protonix Take 1 tablet (40 mg total) by mouth daily.   tamsulosin 0.4 MG Caps capsule Commonly known as: FLOMAX TAKE 2 CAPSULES BY MOUTH EVERY DAY   TUMS CHEWY BITES PO Take 1 tablet by mouth as needed.       Follow-up Information    [name], MD Follow up in 1 week(s).   Specialty: Family Medicine Contact information: [place] [address] [phone]        [name], MD .   Specialty: Cardiology Contact information: [place]  [address] [phone]                Time coordinating discharge 35 min  Signed:  Geradine Girt DO  [facility] 11/14/2020, 11:20 AM

## 2020-11-14 NOTE — Plan of Care (Signed)
Pt pleasantly confused, forgetful and impulsive. Pt has attempted to get out of bed at least 4 times overnight. Hard of hearing. Both hearing aids at bedside when pt asleep and back on when awake. Pt on cardiac and continuous pulse ox monitoring.  On IVF and IV Abx. Denied pain all night. Vitals stable. Safety measures in place. Will continue to monitor.  Problem: Education: Goal: Knowledge of General Education information will improve Description: Including pain rating scale, medication(s)/side effects and non-pharmacologic comfort measures Outcome: Progressing   Problem: Health Behavior/Discharge Planning: Goal: Ability to manage health-related needs will improve Outcome: Progressing   Problem: Clinical Measurements: Goal: Ability to maintain clinical measurements within normal limits will improve Outcome: Progressing Goal: Will remain free from infection Outcome: Progressing Goal: Diagnostic test results will improve Outcome: Progressing Goal: Respiratory complications will improve Outcome: Progressing Goal: Cardiovascular complication will be avoided Outcome: Progressing   Problem: Activity: Goal: Risk for activity intolerance will decrease Outcome: Progressing   Problem: Nutrition: Goal: Adequate nutrition will be maintained Outcome: Progressing   Problem: Coping: Goal: Level of anxiety will decrease Outcome: Progressing   Problem: Elimination: Goal: Will not experience complications related to bowel motility Outcome: Progressing Goal: Will not experience complications related to urinary retention Outcome: Progressing   Problem: Pain Managment: Goal: General experience of comfort will improve Outcome: Progressing   Problem: Safety: Goal: Ability to remain free from injury will improve Outcome: Progressing   Problem: Skin Integrity: Goal: Risk for impaired skin integrity will decrease Outcome: Progressing

## 2020-11-14 NOTE — Evaluation (Signed)
Occupational Therapy Evaluation Patient Details Name: Frank Mcdaniel MRN: [id number] DOB: Mar 07, 1933 Today's Date: 11/14/2020    History of Present Illness Frank Mcdaniel is a 87 y.o. male with medical history significant of hypertension, hyperlipidemia, asthma, GERD, anxiety, IBS, C. difficile, pancytopenia, esophageal stricture, Hairy cell leukemia, hard of hearing, dementia, CAD, CABG, CKD stage IIIa, BPH, sinus bradycardia, UTI, pneumonia, who presents with cough, fever and chills.   Clinical Impression   Frank Mcdaniel was seen for OT evaluation this date. Prior to hospital admission, pt was Independent for mobility and ADLs. Pt lives c son in home c 3 STE, son or PCA available 24/7. Pt presents to acute OT demonstrating impaired ADL performance and functional mobility 2/2 decreased safety awareness and functional strength/balance deficits. Pt currently requires CGA for toilet t/f, perihygiene and hand washing standing sinkside - VCs for thorough hand washing. CGA don mesh underwear seated EOB - poor safety awareness. Pt would benefit from skilled OT to address noted impairments and functional limitations (see below for any additional details) in order to maximize safety and independence while minimizing falls risk and caregiver burden. Upon hospital discharge, anticipate no OT follow up needed.     Follow Up Recommendations  No OT follow up;Supervision/Assistance - 24 hour    Equipment Recommendations  Other (comment) (grab bars)    Recommendations for Other Services       Precautions / Restrictions Precautions Precautions: Fall Restrictions Weight Bearing Restrictions: No      Mobility Bed Mobility Overal bed mobility: Modified Independent             General bed mobility comments: increased time and cues needed    Transfers Overall transfer level: Needs assistance Equipment used: None Transfers: Sit to/from Stand Sit to Stand: Min guard               Balance Overall balance assessment: Needs assistance Sitting-balance support: No upper extremity supported;Feet supported Sitting balance-Leahy Scale: Fair Sitting balance - Comments: poor safety awareness reaching outside BOS   Standing balance support: No upper extremity supported;During functional activity Standing balance-Leahy Scale: Good                             ADL either performed or assessed with clinical judgement   ADL Overall ADL's : Needs assistance/impaired                                       General ADL Comments: CGA for toilet t/f, perihygiene and hand washing standing sinkside - VCs for thorough hand washing. CGA don mesh underwear seated EOB - poor safety awareness                  Pertinent Vitals/Pain Pain Assessment: No/denies pain     Hand Dominance Right   Extremity/Trunk Assessment Upper Extremity Assessment Upper Extremity Assessment: Generalized weakness   Lower Extremity Assessment Lower Extremity Assessment: Generalized weakness   Cervical / Trunk Assessment Cervical / Trunk Assessment: Normal   Communication Communication Communication: HOH   Cognition Arousal/Alertness: Awake/alert Behavior During Therapy: WFL for tasks assessed/performed Overall Cognitive Status: History of cognitive impairments - at baseline  General Comments       Exercises Exercises: Other exercises Other Exercises Other Exercises: Pt and family educated re: OT role, DME recs, d/c recs, falls prevention Other Exercises: LBD, toileting, sup<>sitx2, sit<>standx2, sitting/standing tolerance/balance, hand washing, ~30 ft mobility   Shoulder Instructions      Home Living Family/patient expects to be discharged to:: Private residence Living Arrangements: Children Available Help at Discharge: Family;Personal care attendant;Available 24 hours/day Type of Home: House Home Access:  Stairs to enter [employer] of Steps: 3 Entrance Stairs-Rails: Can reach both;Left;Right Home Layout: Laundry or work area in basement;Two level;Able to live on main level with bedroom/bathroom     Bathroom Shower/Tub: Occupational psychologist: Standard     Home Equipment: Environmental consultant - 2 wheels   Additional Comments: has walker but did not have to use prior to admission      Prior Functioning/Environment Level of Independence: Independent        Comments: Indep with household/community mobilization. Has PCA that is there with him in am and stays until his son returns from work. Son stays in next room when pt showers        OT Problem List: Decreased strength;Decreased activity tolerance;Impaired balance (sitting and/or standing);Decreased safety awareness;Decreased knowledge of use of DME or AE      OT Treatment/Interventions: Self-care/ADL training;Therapeutic exercise;DME and/or AE instruction;Energy conservation;Therapeutic activities;Patient/family education;Balance training    OT Goals(Current goals can be found in the care plan section) Acute Rehab OT Goals Patient Stated Goal: to return home today hopefully OT Goal Formulation: With patient/family Time For Goal Achievement: 11/28/20 Potential to Achieve Goals: Good ADL Goals Pt Will Perform Grooming: Independently;standing Pt Will Perform Lower Body Dressing: Independently;sit to/from stand Pt Will Transfer to Toilet: Independently;ambulating;regular height toilet  OT Frequency: Min 1X/week    AM-PAC OT "6 Clicks" Daily Activity     Outcome Measure Help from another person eating meals?: None Help from another person taking care of personal grooming?: A Little Help from another person toileting, which includes using toliet, bedpan, or urinal?: A Little Help from another person bathing (including washing, rinsing, drying)?: A Little Help from another person to put on and taking off regular upper  body clothing?: None Help from another person to put on and taking off regular lower body clothing?: A Little 6 Click Score: 20   End of Session    Activity Tolerance: Patient tolerated treatment well Patient left: in bed;with call bell/phone within reach;with bed alarm set;with family/visitor present  OT Visit Diagnosis: Unsteadiness on feet (R26.81)                Time: 6144-3154 OT Time Calculation (min): 20 min Charges:  OT General Charges $OT Visit: 1 Visit OT Evaluation $OT Eval Low Complexity: 1 Low OT Treatments $Self Care/Home Management : 8-22 mins  Dessie Coma, M.S. OTR/L  11/14/20, 10:44 AM  ascom [phone]

## 2020-11-15 ENCOUNTER — Telehealth: Payer: Self-pay

## 2020-11-15 NOTE — Telephone Encounter (Signed)
Transition Care Management Unsuccessful Follow-up Telephone Call  Date of discharge and from where:  11/14/20 from [facility]  Attempts:  1st Attempt  Reason for unsuccessful TCM follow-up call:  No answer/busy. Will follow. HFU scheduled 11/22/20 @ 10:00.    

## 2020-11-15 NOTE — Telephone Encounter (Signed)
Was able to reach pt's son Frank Mcdaniel (DPR approved)regarding pt's results, advised Dr. Rockey Situ reported  "Cholesterol is at goal on the current lipid regimen. No changes to the medications were made."  Frank Mcdaniel reports glad of the good news, wanted to let known that Frank Mcdaniel was recently in the hospital for PNA, is on ABX and has f/u with PCP. Otherwise, nothing further at this time.

## 2020-11-16 NOTE — Telephone Encounter (Signed)
Transition Care Management Follow-up Telephone Call  Date of discharge and from where: 11/13/20 from [facility]  How have you been since you were released from the hospital? Information received from son Frank Mcdaniel, HIPAA compliant. States patient  is doing well, for him.   Denies pain, cough, fever, chills, weakness, n/v/d, falls, headache, slurred speech.   Any questions or concerns? No  Items Reviewed:  Did the pt receive and understand the discharge instructions provided? Yes   Medications obtained and verified? Yes   Any new allergies since your discharge? No   Dietary orders reviewed? Yes, DYS  Do you have support at home? Yes   Functional Questionnaire: (I = Independent and D = Dependent) ADLs: Assistance provided   Follow up appointments reviewed:   PCP Hospital f/u appt confirmed? Yes  Scheduled to see Dr. Caryl Mcdaniel on 11/27/20 @ 11:30.  Are transportation arrangements needed? No   If their condition worsens, is the pt aware to call PCP or go to the Emergency Dept.? Yes  Was the patient provided with contact information for the PCP's office or ED? Yes  Was to pt encouraged to call back with questions or concerns? Yes

## 2020-11-18 LAB — CULTURE, BLOOD (ROUTINE X 2)
Culture: NO GROWTH
Culture: NO GROWTH
Special Requests: ADEQUATE

## 2020-11-23 ENCOUNTER — Other Ambulatory Visit: Payer: Self-pay

## 2020-11-27 ENCOUNTER — Ambulatory Visit (INDEPENDENT_AMBULATORY_CARE_PROVIDER_SITE_OTHER): Payer: PPO | Admitting: Family Medicine

## 2020-11-27 ENCOUNTER — Other Ambulatory Visit: Payer: Self-pay

## 2020-11-27 ENCOUNTER — Encounter: Payer: Self-pay | Admitting: Family Medicine

## 2020-11-27 VITALS — BP 118/70 | HR 50 | Temp 98.4°F | Ht 69.0 in | Wt 158.0 lb

## 2020-11-27 DIAGNOSIS — J690 Pneumonitis due to inhalation of food and vomit: Secondary | ICD-10-CM

## 2020-11-27 DIAGNOSIS — R001 Bradycardia, unspecified: Secondary | ICD-10-CM | POA: Diagnosis not present

## 2020-11-27 DIAGNOSIS — E876 Hypokalemia: Secondary | ICD-10-CM | POA: Diagnosis not present

## 2020-11-27 DIAGNOSIS — F0390 Unspecified dementia without behavioral disturbance: Secondary | ICD-10-CM | POA: Diagnosis not present

## 2020-11-27 DIAGNOSIS — K219 Gastro-esophageal reflux disease without esophagitis: Secondary | ICD-10-CM

## 2020-11-27 LAB — BASIC METABOLIC PANEL
BUN: 21 mg/dL (ref 6–23)
CO2: 28 mEq/L (ref 19–32)
Calcium: 8.9 mg/dL (ref 8.4–10.5)
Chloride: 107 mEq/L (ref 96–112)
Creatinine, Ser: 1.5 mg/dL (ref 0.40–1.50)
GFR: 41.97 mL/min — ABNORMAL LOW (ref >60.00)
Glucose, Bld: 95 mg/dL (ref 70–99)
Potassium: 4.1 mEq/L (ref 3.5–5.1)
Sodium: 139 mEq/L (ref 135–145)

## 2020-11-27 NOTE — Assessment & Plan Note (Signed)
Concern for aspiration pneumonia given history and location of pneumonia.  He has recovered.  Given the concern for aspiration we will get a swallow study.  He will continue on Protonix and Pepcid.  He will monitor for any future symptoms.

## 2020-11-27 NOTE — Assessment & Plan Note (Signed)
Patient has had this in the past.  EKG did not reveal any evidence of heart block in the hospital.  We will have him discontinue his Aricept to see if that is beneficial.

## 2020-11-27 NOTE — Patient Instructions (Addendum)
Nice to see you. Please discontinue the Aricept. We will get you scheduled for a swallow study and a chest CT. When they call to schedule this please let them know that she would like this done at [facility] if possible.

## 2020-11-27 NOTE — Progress Notes (Signed)
[name], MD Phone: [phone]  Frank Mcdaniel is a 87 y.o. male who presents today for hospital follow-up.  Aspiration pneumonia: The patient and son notes symptoms started on 11/12/2020 with cough, fever, chills, and weakness.  He noted the patient was on the toilet and was shivering and then the son left the room and heard a thump and the patient had slid down to the floor.  There was no syncope.  He was evaluated in the hospital and found to have aspiration pneumonia and started on IV antibiotics.  He was quickly transitioned to oral antibiotics and discharged home after 1 day.  They report progressive improvement in symptoms.  He notes no cough.  No shortness of breath.  No fevers.  His strength has returned.  He has been eating well and eating potassium rich foods.  They added Pepcid given the concern for aspiration.  He has continued on Protonix.  His son notes this has not been beneficial.  The son notes that the patient does seem to cough and clear his throat after eating and drinking.  Bradycardia: This was apparently noted while he was in the hospital.  There was a recommendation to stop Aricept if this persisted.  Social History   Tobacco Use  Smoking Status Never Smoker  Smokeless Tobacco Never Used    Current Outpatient Medications on File Prior to Visit  Medication Sig Dispense Refill  . amoxicillin-clavulanate (AUGMENTIN) 875-125 MG tablet Take 1 tablet by mouth every 12 (twelve) hours. 8 tablet 0  . aspirin EC 81 MG tablet Take 1 tablet (81 mg total) by mouth daily. Swallow whole. 90 tablet 3  . atorvastatin (LIPITOR) 40 MG tablet TAKE 1/2 TABLETS (20 MG TOTAL) BY MOUTH DAILY. 45 tablet 3  . Calcium Carbonate Antacid (TUMS CHEWY BITES PO) Take 1 tablet by mouth as needed.     . famotidine (PEPCID) 20 MG tablet Take 1 tablet (20 mg total) by mouth daily. 30 tablet 0  . finasteride (PROSCAR) 5 MG tablet TAKE 1 TABLET BY MOUTH EVERY DAY 30 tablet 1  . isosorbide  mononitrate (IMDUR) 30 MG 24 hr tablet Take 1 tablet (30 mg total) by mouth daily. 90 tablet 3  . Multiple Vitamin (MULTIVITAMIN) tablet Take 1 tablet by mouth daily.    . pantoprazole (PROTONIX) 40 MG tablet TAKE 1 TABLET BY MOUTH EVERY DAY 90 tablet 1  . tamsulosin (FLOMAX) 0.4 MG CAPS capsule TAKE 2 CAPSULES BY MOUTH EVERY DAY 180 capsule 1   No current facility-administered medications on file prior to visit.     ROS see history of present illness  Objective  Physical Exam Vitals:   11/27/20 1132  BP: 118/70  Pulse: (!) 50  Temp: 98.4 F (36.9 C)  SpO2: 99%    BP Readings from Last 3 Encounters:  11/27/20 118/70  11/14/20 (!) 100/48  11/08/20 99/70   Wt Readings from Last 3 Encounters:  11/27/20 158 lb (71.7 kg)  11/13/20 160 lb (72.6 kg)  11/08/20 159 lb 12.8 oz (72.5 kg)    Physical Exam Constitutional:      General: He is not in acute distress.    Appearance: He is not diaphoretic.  Cardiovascular:     Rate and Rhythm: Regular rhythm. Bradycardia present.     Heart sounds: Normal heart sounds.  Pulmonary:     Effort: Pulmonary effort is normal.     Breath sounds: Normal breath sounds.  Skin:    General: Skin is warm and  dry.  Neurological:     Mental Status: He is alert.      Assessment/Plan: Please see individual problem list.  Problem List Items Addressed This Visit    Bradycardia    Patient has had this in the past.  EKG did not reveal any evidence of heart block in the hospital.  We will have him discontinue his Aricept to see if that is beneficial.      GERD (gastroesophageal reflux disease)    He will continue Protonix and Pepcid.      Relevant Orders   SLP modified barium swallow   Dementia without behavioral disturbance ([place])    Discussed discontinuing Aricept given bradycardia.  The patient's son asked about Prevagen and I advised that there may be no benefit from it.  Discussed that its unlikely to produce any harm though given that  it is a supplement I cannot specifically recommended as it is not FDA approved.      Aspiration pneumonia ([place])    Concern for aspiration pneumonia given history and location of pneumonia.  He has recovered.  Given the concern for aspiration we will get a swallow study.  He will continue on Protonix and Pepcid.  He will monitor for any future symptoms.      Relevant Orders   SLP modified barium swallow   CT Chest Wo Contrast    Other Visit Diagnoses    Hypokalemia    -  Primary   Relevant Orders   Basic Metabolic Panel (BMET)      This visit occurred during the SARS-CoV-2 public health emergency.  Safety protocols were in place, including screening questions prior to the visit, additional usage of staff PPE, and extensive cleaning of exam room while observing appropriate contact time as indicated for disinfecting solutions.    [name], MD [place]

## 2020-11-27 NOTE — Assessment & Plan Note (Signed)
Discussed discontinuing Aricept given bradycardia.  The patient's son asked about Prevagen and I advised that there may be no benefit from it.  Discussed that its unlikely to produce any harm though given that it is a supplement I cannot specifically recommended as it is not FDA approved.

## 2020-11-27 NOTE — Assessment & Plan Note (Signed)
He will continue Protonix and Pepcid.

## 2020-11-28 ENCOUNTER — Other Ambulatory Visit: Payer: Self-pay | Admitting: Family Medicine

## 2020-11-28 DIAGNOSIS — N179 Acute kidney failure, unspecified: Secondary | ICD-10-CM

## 2020-11-29 ENCOUNTER — Telehealth: Payer: Self-pay

## 2020-11-29 DIAGNOSIS — N179 Acute kidney failure, unspecified: Secondary | ICD-10-CM

## 2020-11-29 NOTE — Telephone Encounter (Signed)
-----   Message from [name], MD sent at 11/28/2020  2:54 PM EDT ----- Please let the patient's son know that his kidney function has worsened slightly from discharge.  How much water has he been drinking?  I would suggest rechecking this later this week.  Order placed.

## 2020-11-30 ENCOUNTER — Other Ambulatory Visit: Payer: Self-pay | Admitting: Family Medicine

## 2020-11-30 ENCOUNTER — Other Ambulatory Visit: Payer: Self-pay | Admitting: Cardiovascular Disease

## 2020-11-30 DIAGNOSIS — K219 Gastro-esophageal reflux disease without esophagitis: Secondary | ICD-10-CM

## 2020-11-30 DIAGNOSIS — J690 Pneumonitis due to inhalation of food and vomit: Secondary | ICD-10-CM

## 2020-12-04 ENCOUNTER — Ambulatory Visit (INDEPENDENT_AMBULATORY_CARE_PROVIDER_SITE_OTHER): Payer: PPO

## 2020-12-04 ENCOUNTER — Other Ambulatory Visit: Payer: Self-pay

## 2020-12-04 ENCOUNTER — Other Ambulatory Visit: Payer: PPO

## 2020-12-04 VITALS — HR 58

## 2020-12-04 DIAGNOSIS — R001 Bradycardia, unspecified: Secondary | ICD-10-CM | POA: Diagnosis not present

## 2020-12-04 DIAGNOSIS — N179 Acute kidney failure, unspecified: Secondary | ICD-10-CM | POA: Diagnosis not present

## 2020-12-04 NOTE — Progress Notes (Signed)
Patient is here for a BPM check due to bpm being low at last visit, as per patient.  Currently patients BPM is 58.  Patient has no complaints of headaches, blurry vision, chest pain, arm pain, light headedness, dizziness, and nor jaw pain. Please see note from 11-27-20 for order.

## 2020-12-05 LAB — BASIC METABOLIC PANEL
BUN: 25 mg/dL — ABNORMAL HIGH (ref 6–23)
CO2: 29 mEq/L (ref 19–32)
Calcium: 9.2 mg/dL (ref 8.4–10.5)
Chloride: 105 mEq/L (ref 96–112)
Creatinine, Ser: 1.42 mg/dL (ref 0.40–1.50)
GFR: 44.82 mL/min — ABNORMAL LOW (ref >60.00)
Glucose, Bld: 98 mg/dL (ref 70–99)
Potassium: 4.1 mEq/L (ref 3.5–5.1)
Sodium: 140 mEq/L (ref 135–145)

## 2020-12-06 ENCOUNTER — Ambulatory Visit: Payer: PPO | Admitting: Family Medicine

## 2020-12-11 ENCOUNTER — Encounter: Payer: Self-pay | Admitting: Family Medicine

## 2020-12-12 ENCOUNTER — Ambulatory Visit
Admission: RE | Admit: 2020-12-12 | Discharge: 2020-12-12 | Disposition: A | Discharge status: Home / Self Care | Payer: PPO | Source: Ambulatory Visit | Attending: Family Medicine | Admitting: Family Medicine

## 2020-12-12 ENCOUNTER — Other Ambulatory Visit: Payer: Self-pay

## 2020-12-12 DIAGNOSIS — J181 Lobar pneumonia, unspecified organism: Secondary | ICD-10-CM | POA: Diagnosis not present

## 2020-12-12 DIAGNOSIS — M2578 Osteophyte, vertebrae: Secondary | ICD-10-CM | POA: Diagnosis not present

## 2020-12-12 DIAGNOSIS — J690 Pneumonitis due to inhalation of food and vomit: Secondary | ICD-10-CM | POA: Insufficient documentation

## 2020-12-12 DIAGNOSIS — J9811 Atelectasis: Secondary | ICD-10-CM | POA: Diagnosis not present

## 2020-12-12 DIAGNOSIS — J984 Other disorders of lung: Secondary | ICD-10-CM | POA: Diagnosis not present

## 2020-12-12 MED ORDER — FAMOTIDINE 20 MG PO TABS: 20.0000 mg | ORAL_TABLET | Freq: Every day | ORAL | 3 refills | Status: DC

## 2020-12-14 ENCOUNTER — Encounter: Payer: Self-pay | Admitting: Family Medicine

## 2020-12-14 ENCOUNTER — Ambulatory Visit
Admission: RE | Admit: 2020-12-14 | Discharge: 2020-12-14 | Disposition: A | Discharge status: Home / Self Care | Payer: PPO | Source: Ambulatory Visit | Attending: Family Medicine | Admitting: Family Medicine

## 2020-12-14 ENCOUNTER — Other Ambulatory Visit: Payer: Self-pay

## 2020-12-14 DIAGNOSIS — R1314 Dysphagia, pharyngoesophageal phase: Secondary | ICD-10-CM | POA: Diagnosis not present

## 2020-12-14 DIAGNOSIS — K219 Gastro-esophageal reflux disease without esophagitis: Secondary | ICD-10-CM | POA: Diagnosis not present

## 2020-12-14 DIAGNOSIS — J690 Pneumonitis due to inhalation of food and vomit: Secondary | ICD-10-CM

## 2020-12-14 DIAGNOSIS — R1310 Dysphagia, unspecified: Secondary | ICD-10-CM | POA: Diagnosis not present

## 2020-12-14 MED ORDER — MEMANTINE HCL 5 MG PO TABS: 5.0000 mg | ORAL_TABLET | Freq: Two times a day (BID) | ORAL | 2 refills | Status: DC

## 2020-12-14 NOTE — Therapy (Addendum)
[place], [place], [address] Phone: [phone]   Fax:     Modified Barium Swallow  Patient Details  Name: Frank Mcdaniel MRN: [id number] Date of Birth: Jun 29, 1933 No data recorded  Encounter Date: 12/14/2020   End of Session - 12/14/20 1709    Visit Number 1    Number of Visits 1    Date for SLP Re-Evaluation 12/14/20    SLP Start Time 18    SLP Stop Time  1430    SLP Time Calculation (min) 90 min    Activity Tolerance Patient tolerated treatment well           Past Medical History:  Diagnosis Date  . Anemia 09/01/2015  . Anxiety 09/11/2015  . Arthritis   . Asthma    as a teenager  . Asymptomatic Sinus Bradycardia   . Atypical chest pain    a. 08/2014  . BPH (benign prostatic hyperplasia)   . CKD (chronic kidney disease), stage III ([place])   . Closed fracture of tuft of distal phalanx of finger 08/31/2018  . Community acquired pneumonia 08/10/2020  . Coronary artery disease    a. 2000 s/p CABG;  b. 10/2008 Neg MV, EF 64%; c. cath 05/2015: LM 85% sev cal, pLAD-1 lesion 80%, pLAD-2 lesion 100% chronic, ostLCx 70% sev cal, OM4 90%, ostRCA 70%, mid RCA 90%, LIMA-LAD patent, VG-OM2 patent, VG-OM3 patent, VG-RPDA patent. No AS  . Dementia without behavioral disturbance ([place]) 05/28/2019  . GERD (gastroesophageal reflux disease)   . H/O echocardiogram    a. 10/2005 Echo: nl EF.  [name] Hairy cell leukemia ([place])   . HOH (hard of hearing)    Bilateral hearing aids  . HTN (hypertension)   . Hyperlipidemia   . Hypertension   . Leukemia ([place])   . Neutropenic fever ([place]) 11/08/2019  . Stricture and stenosis of esophagus     Past Surgical History:  Procedure Laterality Date  . CARDIAC CATHETERIZATION N/A 05/26/2015   Procedure: Left Heart Cath;  Surgeon: [name], MD;  Location: [facility];  Service: Cardiovascular;  Laterality: N/A;  . CARDIAC CATHETERIZATION N/A 05/26/2015    Procedure: Coronary/Graft Angiography;  Surgeon: [name], MD;  Location: [facility];  Service: Cardiovascular;  Laterality: N/A;  . CATARACT EXTRACTION    . CORONARY ARTERY BYPASS GRAFT  2000  . ESOPHAGOGASTRODUODENOSCOPY N/A 02/18/2017   Procedure: ESOPHAGOGASTRODUODENOSCOPY (EGD) with removal of food bolus;  Surgeon: [name], MD;  Location: [facility];  Service: Endoscopy;  Laterality: N/A;  . ESOPHAGOGASTRODUODENOSCOPY (EGD) WITH PROPOFOL N/A 03/11/2017   Procedure: ESOPHAGOGASTRODUODENOSCOPY (EGD) WITH PROPOFOL;  Surgeon: [name], MD;  Location: [facility] ENDOSCOPY;  Service: Endoscopy;  Laterality: N/A;  . ESOPHAGOGASTRODUODENOSCOPY (EGD) WITH PROPOFOL N/A 04/15/2017   Procedure: ESOPHAGOGASTRODUODENOSCOPY (EGD) WITH PROPOFOL;  Surgeon: [name], MD;  Location: [facility] ENDOSCOPY;  Service: Endoscopy;  Laterality: N/A;  . ESOPHAGOGASTRODUODENOSCOPY (EGD) WITH PROPOFOL N/A 09/12/2020   Procedure: ESOPHAGOGASTRODUODENOSCOPY (EGD) WITH PROPOFOL;  Surgeon: Lin Landsman, MD;  Location: [facility] ENDOSCOPY;  Service: Gastroenterology;  Laterality: N/A;  . EYE SURGERY Left    Cataract Extraction with IOL  . INGUINAL HERNIA REPAIR Left 06/24/2016   Procedure: HERNIA REPAIR INGUINAL ADULT;  Surgeon: [name], MD;  Location: [facility] ORS;  Service: General;  Laterality: Left;  . TONSILLECTOMY  1942   . UPPER GASTROINTESTINAL ENDOSCOPY  3 years ago    with Dilation.  There were no vitals filed for this visit.      Subjective: Patient behavior: (alertness, ability to follow instructions, etc.): pt pleasant, verbal and able to answer basic questions re: self and follow basic, 1-step commands w/ Cues. Pt has a Baseline of Dementia. He is followed by the [facility].  Chief complaint: dysphagia. Pt has had recent hospitalizations for Pulmonary issues including Pneumonia; suspected GI Regurgitation impacting airway per Son, notes. Pt has had several EGDs w/ the most  recent in 09/12/2020 dx'ing a "Moderately Tortuous Esophagus; Medium-size Hiatal Hernia".    OM Exam: WFL; no unilateral weakness Dentition: native, full   Objective:  Radiological Procedure: A videoflouroscopic evaluation of oral-preparatory, reflex initiation, and pharyngeal phases of the swallow was performed; as well as a screening of the upper esophageal phase.  I. POSTURE: upright II. VIEW: lateral III. COMPENSATORY STRATEGIES: CHIN TUCK; f/u DRY swallow w/ each bolus IV. BOLUSES ADMINISTERED:  Thin Liquid: 6 trials via Cup  Nectar-thick Liquid: 5 trials via Cup  Honey-thick Liquid: NT  Puree: 3 trials  Mechanical Soft: 2 trials V. RESULTS OF EVALUATION: A. ORAL PREPARATORY PHASE: (The lips, tongue, and velum are observed for strength and coordination)       **Overall Severity Rating: Mild.   B. SWALLOW INITIATION/REFLEX: (The reflex is normal if "triggered" by the time the bolus reached the base of the tongue)  **Overall Severity Rating: Mod+ Impaired  C. PHARYNGEAL PHASE: (Pharyngeal function is normal if the bolus shows rapid, smooth, and continuous transit through the pharynx and there is no pharyngeal residue after the swallow)  **Overall Severity Rating: Mod+ Impaired  D. LARYNGEAL PENETRATION: (Material entering into the laryngeal inlet/vestibule but not aspirated): more consistent w/ thin liquids; laryngeal penetration noted w/ bolus residue from trials b/t swallows/trials: noted slight-min Penetration both During and After(w/ residue) the swallows w/ all trial consistencies USING the swallowing strategies; this appeared to clear w/ f/u, Dry swallow strategy. Cough strategy was implemented to aid clearing as well.   E. ASPIRATION: noted w/ all consistencies WITHOUT use of swallowing strategies both During and After(w/ residue) the swallows. SILENT ASPIRATION. F. ESOPHAGEAL PHASE: (Screening of the upper esophagus): SIGNIFICANT/SEVERE Esophageal Phase Dysmotility c/b bolus  Stasis and Retrograde activity to the level of the Pyriform Sinuses w/ both liquid and food trials given. Noted apparent prominent Cricopharyngeus Muscle but it did not appear to impede bolus flow through the UES.    ASSESSMENT: Pt appears to present w/ Moderate-Severe Pharyngo-Esophageal phase Dysphagia w/ SILENT Laryngeal Penetration and Aspiration occurring during this study w/ ALL trial consistencies(without use of swallowing Strategies). Pt is at increased risk for Aspiration w/ ALL oral intake w/out use of a modified diet and swallowing strategies. With use of Nectar consistency liquids following strict aspiration precautions, and the use of swallowing Strategies of CHIN TUCK and f/u, DRY Swallows, and f/u Cough to aid airway protection, pt's presentation and risk for Aspiration was reduced somewhat, and pt was able to adequately swallow bolus trials w/out immediate Laryngeal Penetration and Aspiration impacting airway safety.  Suspect pt's BASELINE Esophageal phase deficits (SIGNIFICANT/SEVERE), could be contributing to pt's pharyngeal phase dysphagia and Aspiration. Pt also exhibited Mild Oral phase deficits c/b min oral residue post initial swallow - this was inconsistent w/ some bolus consistencies. Due to the SILENT Aspiration noted w/ liquid and food consistencies, pt is at increased risk for negative sequelae from Aspiration to occur including Aspiration Pneumonia, decreased oral intake in general impacting hydration/nutrition.  During the Oral phase,  grossly timely bolus management and control of bolus for A-P propulsion and transfer occurred. Min+ oral residue remained moreso w/ liquid boluses. This residue could be reduced/cleared w/ a f/u, Dry swallow. However, when remaining oral residue spilled to the Valleculae and Pyriform Sinuses, pt was at increased risk for aspiration of the pharyngeal residue BETWEEN the trials.  During the Pharyngeal phase, delayed pharyngeal swallow initiation noted  w/ thin and Nectar liquid consistencies. Reduced Epiglottic inversion and tight airway closure was noted as well. Decreased BOT and pharyngeal pressure (umph) noted during the swallow. SILENT Aspiration and Laryngeal Penetration occurred BEFORE and DURING the swallows as well as AFTER on the pharyngeal residue remaining post swallow. W/ implementation of the swallowing Strategies of CHIN TUCK and f/u, DRY SWALLOW w/ bolus trials, and use of NECTAR CONSISTENCY liquids, pt demonstrated NO immediate SILENT Aspiration or Laryngeal Penetration before/during the swallow. Min+ Valleculae, and sometimes diffuse pharyngeal residue remained, but this appeared to reduce and/or clear w/ a f/u, Dry swallow. Use of these Strategies during all swallowing of ALL trial consistencies of food and liquid appeared beneficial in reducing occurrence of SILENT Aspiration and Laryngeal Penetration. Pt was able to follow through instructions and model using the swallowing Strategies w/ Min+ Verbal Cues. Cough strategy was also utilized to aid airway protection.   OF NOTE, during the Esophageal phase, prominence of Cricopharyngeus Muscle w/ posterior prominence noted during the swallow -- this did not appear to impede bolus motility through the UES w/ the trials given. However, SEVERE/SIGNIFICANT Esophageal Dysmotility c/b bolus Stasis and Retrograde activity was noted in the Cervical Esophagus during lateral view w/ all trial consistencies of food/liquids given. Time appeared to aid reduction of the bolus stasis(viewable). Strongly Suspect pt's Esophageal phase Dysmotility and deficits impacts the Pharyngeal phase of his swallowing.    Discussed results of MBSS and impact of Pharyngeal phase Dysphagia and Aspiration(SILENT) and the Esophageal phase Dysmotility on safety of swallowing. Discussed need for use of NECTAR consistency liquids; swallowing strategies of Chin Tuck and Dry Swallow and Cough intermittently to aid protection of the  airway. Discussed food consistencies; moistening foods w/ Gravies. Discussed thickened liquids and thickening agent to reach NECTAR consistency. Discussed Pill swallowing strategies - use of a Puree to swallow Pills w/. Video viewed w/ pt and Son present; questions answered; modeled swallowing Strategies. Handouts on swallowing Strategies, aspiration precautions, REFLUX and Esophageal phase Dysmotility, ordering information for thickened liquids and supplies, and diet consistency information was given to pt/Son.  Recommended f/u w/ GI for assessment and Education on Esophageal phase Dysmotility and its impact on swallowing; potential Aspiration and impact on overall medical status. Recommend f/u w/ Palliative Care consult w/ pt and Family to discuss pt's Dysphagia and overall GOC. Pt would benefit from [facility] or Outpatient ST services for Dysphagia education; practice and follow through w/ aspiration precautions/swallowing Strategies; monitoring of pt's toleration of oral intake and monitoring of pt's Pulmonary health/status; ongoing discussion w/ Son/pt re: pt's swallowing status and any Education.     PLAN/RECOMMENDATIONS:  A. Diet: Mech Soft diet (foods cut small/broken down well, moistened); NECTAR consistency liquids Via Cup. STRICT REFLUX/GERD PRECAUTIONS. Pills given in Puree.  B. Swallowing Precautions: Aspiration precautions; swallowing Strategies w/ all swallowing of food/liquids: CHIN TUCK, and f/u, DRY swallows to clear fully. F/u Cough b/t trials and at end of meal.   C. Recommended consultation to: GI for further understanding of impact of Significant Esophageal phase Dysmotility on the swallowing; pt's PCP for further discussion  of [place] including a Palliative Care consult for further Education and support for pt/family if needed.   D. Therapy recommendations: Home Health or Outpatient ST services for Dysphagia education; practice and follow through w/ aspiration precautions/swallowing  strategies; monitoring of pt's toleration of oral intake and monitoring of pt's Pulmonary status/health; ongoing discussion w/ Son/pt re: pt's swallowing status and any education   E. Results and recommendations were discussed w/ pt/Son; video viewed w/ discussion on Dysphagia given; questions answered; modeled swallowing strategies for Son/pt; provided written information re: the above including dysphagia, aspiration precautions/swallowing strategies, thickening of liquids/modification of diet, ordering information for thickened liquids.           Patient will benefit from skilled therapeutic intervention in order to improve the following deficits and impairments:   Pharyngoesophageal dysphagia  Aspiration pneumonia of right lower lobe due to gastric secretions ([place]) - Plan: DG SWALLOW FUNC OP MEDICARE SPEECH PATH, DG SWALLOW FUNC OP MEDICARE SPEECH PATH  Gastroesophageal reflux disease without esophagitis - Plan: DG SWALLOW FUNC OP MEDICARE SPEECH PATH, DG SWALLOW FUNC OP MEDICARE SPEECH PATH        Problem List Patient Active Problem List   Diagnosis Date Noted  . Aspiration pneumonia ([place]) 11/27/2020  . Elevated troponin 11/13/2020  . Fall 11/13/2020  . Sepsis ([place]) 09/24/2020  . Pulmonary nodule 09/24/2020  . Thickening of esophagus   . UTI (urinary tract infection) 08/10/2020  . Palliative care encounter   . Febrile neutropenia ([place]) 07/18/2020  . CKD (chronic kidney disease), stage IIIa 02/28/2020  . Pancytopenia ([place]) 01/07/2020  . BRBPR (bright red blood per rectum) 12/31/2019  . Abnormal EKG 11/23/2019  . IBS (irritable bowel syndrome) 08/02/2019  . Dementia without behavioral disturbance ([place]) 05/28/2019  . Status post inguinal hernia repair 09/13/2016  . Constipation 05/21/2016  . Memory loss or impairment 02/15/2016  . Chronic fatigue 12/21/2015  . Bilateral low back pain without sciatica 12/21/2015  . Anxiety 09/11/2015  . Anemia 09/01/2015  . GERD  (gastroesophageal reflux disease) 06/02/2015  . Asthma 01/28/2015  . BPH (benign prostatic hyperplasia) 01/28/2015  . Hairy cell leukemia ([place]) 01/28/2015  . Coronary artery disease   . S/P CABG (coronary artery bypass graft) 11/08/2010  . HTN (hypertension) 11/08/2010  . Bradycardia 11/08/2010  . Hyperlipidemia 10/27/2009        Orinda Kenner, [place], [place] Speech Language Pathologist Rehab Services [phone] [facility] 12/14/2020, 5:10 PM  [facility] [place], [place], [address] Phone: [phone]   Fax:     Name: Frank Mcdaniel MRN: [id number] Date of Birth: March 20, 1933

## 2020-12-14 NOTE — Addendum Note (Signed)
Addended by: Caryl Bis, Letia Guidry G on: 12/14/2020 10:40 AM   Modules accepted: Orders

## 2020-12-14 NOTE — Telephone Encounter (Signed)
Namenda sent to pharmacy.  Please contact the patient's son to get him scheduled for a visit with me in 2 to 3 weeks for follow-up on memory and heart rate.  Thanks.

## 2020-12-18 ENCOUNTER — Encounter: Payer: Self-pay | Admitting: Family Medicine

## 2020-12-20 ENCOUNTER — Telehealth: Payer: Self-pay | Admitting: Family Medicine

## 2020-12-20 DIAGNOSIS — J690 Pneumonitis due to inhalation of food and vomit: Secondary | ICD-10-CM

## 2020-12-20 DIAGNOSIS — R1310 Dysphagia, unspecified: Secondary | ICD-10-CM

## 2020-12-20 NOTE — Telephone Encounter (Signed)
Please let the patient's son know that I placed a home health speech therapy referral.  Given that the patient has already followed with GI the patient's son needs to contact [facility] to schedule a follow-up for the patient's trouble swallowing.  It may also be a good idea to have palliative care evaluate him to discuss goals of care given the degree of his aspiration.  I can place that referral if they are willing.  Thanks.

## 2020-12-25 DIAGNOSIS — E785 Hyperlipidemia, unspecified: Secondary | ICD-10-CM | POA: Diagnosis not present

## 2020-12-25 DIAGNOSIS — F0280 Dementia in other diseases classified elsewhere without behavioral disturbance: Secondary | ICD-10-CM | POA: Diagnosis not present

## 2020-12-25 DIAGNOSIS — K219 Gastro-esophageal reflux disease without esophagitis: Secondary | ICD-10-CM | POA: Diagnosis not present

## 2020-12-25 DIAGNOSIS — Z87440 Personal history of urinary (tract) infections: Secondary | ICD-10-CM | POA: Diagnosis not present

## 2020-12-25 DIAGNOSIS — C9140 Hairy cell leukemia not having achieved remission: Secondary | ICD-10-CM | POA: Diagnosis not present

## 2020-12-25 DIAGNOSIS — D61818 Other pancytopenia: Secondary | ICD-10-CM | POA: Diagnosis not present

## 2020-12-25 DIAGNOSIS — R1314 Dysphagia, pharyngoesophageal phase: Secondary | ICD-10-CM | POA: Diagnosis not present

## 2020-12-25 DIAGNOSIS — H9190 Unspecified hearing loss, unspecified ear: Secondary | ICD-10-CM | POA: Diagnosis not present

## 2020-12-25 DIAGNOSIS — G309 Alzheimer's disease, unspecified: Secondary | ICD-10-CM | POA: Diagnosis not present

## 2020-12-25 DIAGNOSIS — N400 Enlarged prostate without lower urinary tract symptoms: Secondary | ICD-10-CM | POA: Diagnosis not present

## 2020-12-25 DIAGNOSIS — I129 Hypertensive chronic kidney disease with stage 1 through stage 4 chronic kidney disease, or unspecified chronic kidney disease: Secondary | ICD-10-CM | POA: Diagnosis not present

## 2020-12-25 DIAGNOSIS — K222 Esophageal obstruction: Secondary | ICD-10-CM | POA: Diagnosis not present

## 2020-12-25 DIAGNOSIS — I2510 Atherosclerotic heart disease of native coronary artery without angina pectoris: Secondary | ICD-10-CM | POA: Diagnosis not present

## 2020-12-25 DIAGNOSIS — M1990 Unspecified osteoarthritis, unspecified site: Secondary | ICD-10-CM | POA: Diagnosis not present

## 2020-12-25 DIAGNOSIS — Z9181 History of falling: Secondary | ICD-10-CM | POA: Diagnosis not present

## 2020-12-25 DIAGNOSIS — Z8701 Personal history of pneumonia (recurrent): Secondary | ICD-10-CM | POA: Diagnosis not present

## 2020-12-25 DIAGNOSIS — Z951 Presence of aortocoronary bypass graft: Secondary | ICD-10-CM | POA: Diagnosis not present

## 2020-12-25 DIAGNOSIS — D709 Neutropenia, unspecified: Secondary | ICD-10-CM | POA: Diagnosis not present

## 2020-12-25 DIAGNOSIS — N1831 Chronic kidney disease, stage 3a: Secondary | ICD-10-CM | POA: Diagnosis not present

## 2020-12-25 DIAGNOSIS — J45909 Unspecified asthma, uncomplicated: Secondary | ICD-10-CM | POA: Diagnosis not present

## 2020-12-25 DIAGNOSIS — F419 Anxiety disorder, unspecified: Secondary | ICD-10-CM | POA: Diagnosis not present

## 2020-12-25 DIAGNOSIS — K589 Irritable bowel syndrome without diarrhea: Secondary | ICD-10-CM | POA: Diagnosis not present

## 2020-12-25 DIAGNOSIS — D631 Anemia in chronic kidney disease: Secondary | ICD-10-CM | POA: Diagnosis not present

## 2020-12-26 ENCOUNTER — Other Ambulatory Visit: Payer: Self-pay | Admitting: Family Medicine

## 2020-12-26 ENCOUNTER — Telehealth: Payer: Self-pay | Admitting: Family Medicine

## 2020-12-26 NOTE — Telephone Encounter (Signed)
Noted. Can you please call the son and see if there was a particular reason the patient declined home health services?

## 2020-12-26 NOTE — Telephone Encounter (Signed)
Di Kindle from [facility] home care called to let Dr.Sonnenberg know that patient decline services from them

## 2020-12-28 NOTE — Telephone Encounter (Signed)
Patient's son [name] talked at great length that basically [facility] was going to be doing the same thing they were shown to do at the hospital with the speech therapist. They have been cutting patient's food in small bites & working with him as they were instructed. He said that they did want someone coming out to repeat what family is already doing. He said they have had appointments almost daily for the last two weeks & they keep hearing almost exactly the same things over & over. He said he was not trying to be short or ugly, but it all has been a lot to handle.

## 2020-12-29 ENCOUNTER — Encounter: Payer: Self-pay | Admitting: Family Medicine

## 2020-12-29 ENCOUNTER — Telehealth: Payer: Self-pay | Admitting: Family Medicine

## 2020-12-29 ENCOUNTER — Ambulatory Visit
Admission: RE | Admit: 2020-12-29 | Discharge: 2020-12-29 | Disposition: A | Discharge status: Home / Self Care | Payer: PPO | Source: Ambulatory Visit | Attending: Emergency Medicine | Admitting: Emergency Medicine

## 2020-12-29 ENCOUNTER — Other Ambulatory Visit: Payer: Self-pay

## 2020-12-29 VITALS — BP 108/63 | HR 56 | Temp 98.1°F | Resp 16

## 2020-12-29 DIAGNOSIS — L237 Allergic contact dermatitis due to plants, except food: Secondary | ICD-10-CM

## 2020-12-29 MED ORDER — PREDNISONE 10 MG (21) PO TBPK: ORAL_TABLET | Freq: Every day | ORAL | 0 refills | Status: DC

## 2020-12-29 NOTE — Telephone Encounter (Signed)
Can they come at 4:15 as long as he can pass all the screening questions? If he has symptoms other than his eye symptoms he may not be able to come in to the office.

## 2020-12-29 NOTE — Telephone Encounter (Signed)
Pt son called and said that his fathers eyes are puffy and fluid filled  They only wanted an appt here today  Didn't want to go to UC or transferred to the nurse

## 2020-12-29 NOTE — ED Triage Notes (Signed)
Patient presents to Urgent Care with complaints of watery itchy eyes and bilateral eye swelling since yesterday. Unsure if related to poison oak.    Denies changes in vision.

## 2020-12-29 NOTE — Discharge Instructions (Signed)
Take the prednisone as directed.  Follow up with your primary care provider if your symptoms are not improving.    

## 2020-12-29 NOTE — ED Provider Notes (Signed)
[name]    CSN: [id number] Arrival date & time: 12/29/20  1054      History   Chief Complaint Chief Complaint  Patient presents with  . Appointment    1100 appt   . Eye Problem    HPI ZACHERIE HONEYMAN is a 87 y.o. male.   Accompanied by his son, patient presents with red rash on facial cheeks and around eyes x1 day.  He has swelling of his eyelids.  Son reports he was exposed to poison oak after cutting down a tree together a couple of days ago.  No acute eye pain or changes in his vision.  No fever, chills, or other symptoms.  No swelling in his extremities or difficulty breathing.  No treatments attempted at home.  His medical history includes hypertension, CAD, CKD, asthma, chronic fatigue, low back pain, dementia.  The history is provided by the patient and a relative.    Past Medical History:  Diagnosis Date  . Anemia 09/01/2015  . Anxiety 09/11/2015  . Arthritis   . Asthma    as a teenager  . Asymptomatic Sinus Bradycardia   . Atypical chest pain    a. 08/2014  . BPH (benign prostatic hyperplasia)   . CKD (chronic kidney disease), stage III ([place])   . Closed fracture of tuft of distal phalanx of finger 08/31/2018  . Community acquired pneumonia 08/10/2020  . Coronary artery disease    a. 2000 s/p CABG;  b. 10/2008 Neg MV, EF 64%; c. cath 05/2015: LM 85% sev cal, pLAD-1 lesion 80%, pLAD-2 lesion 100% chronic, ostLCx 70% sev cal, OM4 90%, ostRCA 70%, mid RCA 90%, LIMA-LAD patent, VG-OM2 patent, VG-OM3 patent, VG-RPDA patent. No AS  . Dementia without behavioral disturbance ([place]) 05/28/2019  . GERD (gastroesophageal reflux disease)   . H/O echocardiogram    a. 10/2005 Echo: nl EF.  [name] Hairy cell leukemia ([place])   . HOH (hard of hearing)    Bilateral hearing aids  . HTN (hypertension)   . Hyperlipidemia   . Hypertension   . Leukemia ([place])   . Neutropenic fever ([place]) 11/08/2019  . Stricture and stenosis of esophagus     Patient Active Problem List   Diagnosis  Date Noted  . Aspiration pneumonia ([place]) 11/27/2020  . Elevated troponin 11/13/2020  . Fall 11/13/2020  . Sepsis ([place]) 09/24/2020  . Pulmonary nodule 09/24/2020  . Thickening of esophagus   . UTI (urinary tract infection) 08/10/2020  . Palliative care encounter   . Febrile neutropenia ([place]) 07/18/2020  . CKD (chronic kidney disease), stage IIIa 02/28/2020  . Pancytopenia ([place]) 01/07/2020  . BRBPR (bright red blood per rectum) 12/31/2019  . Abnormal EKG 11/23/2019  . IBS (irritable bowel syndrome) 08/02/2019  . Dementia without behavioral disturbance ([place]) 05/28/2019  . Status post inguinal hernia repair 09/13/2016  . Constipation 05/21/2016  . Memory loss or impairment 02/15/2016  . Chronic fatigue 12/21/2015  . Bilateral low back pain without sciatica 12/21/2015  . Anxiety 09/11/2015  . Anemia 09/01/2015  . GERD (gastroesophageal reflux disease) 06/02/2015  . Asthma 01/28/2015  . BPH (benign prostatic hyperplasia) 01/28/2015  . Hairy cell leukemia ([place]) 01/28/2015  . Coronary artery disease   . S/P CABG (coronary artery bypass graft) 11/08/2010  . HTN (hypertension) 11/08/2010  . Bradycardia 11/08/2010  . Hyperlipidemia 10/27/2009    Past Surgical History:  Procedure Laterality Date  . CARDIAC CATHETERIZATION N/A 05/26/2015   Procedure: Left Heart Cath;  Surgeon: [name],  MD;  Location: [place] [facility];  Service: Cardiovascular;  Laterality: N/A;  . CARDIAC CATHETERIZATION N/A 05/26/2015   Procedure: Coronary/Graft Angiography;  Surgeon: [name], MD;  Location: [place] [facility];  Service: Cardiovascular;  Laterality: N/A;  . CATARACT EXTRACTION    . CORONARY ARTERY BYPASS GRAFT  2000  . ESOPHAGOGASTRODUODENOSCOPY N/A 02/18/2017   Procedure: ESOPHAGOGASTRODUODENOSCOPY (EGD) with removal of food bolus;  Surgeon: [name], MD;  Location: [facility] ENDOSCOPY;  Service: Endoscopy;  Laterality: N/A;  . ESOPHAGOGASTRODUODENOSCOPY (EGD) WITH PROPOFOL N/A  03/11/2017   Procedure: ESOPHAGOGASTRODUODENOSCOPY (EGD) WITH PROPOFOL;  Surgeon: [name], MD;  Location: [facility] ENDOSCOPY;  Service: Endoscopy;  Laterality: N/A;  . ESOPHAGOGASTRODUODENOSCOPY (EGD) WITH PROPOFOL N/A 04/15/2017   Procedure: ESOPHAGOGASTRODUODENOSCOPY (EGD) WITH PROPOFOL;  Surgeon: [name], MD;  Location: [facility] ENDOSCOPY;  Service: Endoscopy;  Laterality: N/A;  . ESOPHAGOGASTRODUODENOSCOPY (EGD) WITH PROPOFOL N/A 09/12/2020   Procedure: ESOPHAGOGASTRODUODENOSCOPY (EGD) WITH PROPOFOL;  Surgeon: Lin Landsman, MD;  Location: [facility] ENDOSCOPY;  Service: Gastroenterology;  Laterality: N/A;  . EYE SURGERY Left    Cataract Extraction with IOL  . INGUINAL HERNIA REPAIR Left 06/24/2016   Procedure: HERNIA REPAIR INGUINAL ADULT;  Surgeon: [name], MD;  Location: [facility] ORS;  Service: General;  Laterality: Left;  . TONSILLECTOMY  1942   . UPPER GASTROINTESTINAL ENDOSCOPY  3 years ago    with Dilation.        Home Medications    Prior to Admission medications   Medication Sig Start Date End Date Taking? Authorizing Provider  predniSONE (STERAPRED UNI-PAK 21 TAB) 10 MG (21) TBPK tablet Take by mouth daily. As directed 12/29/20  Yes Sharion Balloon, NP  amoxicillin-clavulanate (AUGMENTIN) 875-125 MG tablet Take 1 tablet by mouth every 12 (twelve) hours. 11/14/20   Geradine Girt, DO  aspirin EC 81 MG tablet Take 1 tablet (81 mg total) by mouth daily. Swallow whole. 10/13/20   [name], MD  atorvastatin (LIPITOR) 40 MG tablet TAKE 1/2 TABLETS (20 MG TOTAL) BY MOUTH DAILY. 11/14/20   [name], MD  Calcium Carbonate Antacid (TUMS CHEWY BITES PO) Take 1 tablet by mouth as needed.     [provider]  famotidine (PEPCID) 20 MG tablet Take 1 tablet (20 mg total) by mouth daily. 12/12/20   [name], MD  finasteride (PROSCAR) 5 MG tablet TAKE 1 TABLET BY MOUTH EVERY DAY 12/26/20   [name], MD  isosorbide mononitrate (IMDUR) 30 MG 24 hr  tablet Take 1 tablet (30 mg total) by mouth daily. 10/13/20   [name], MD  memantine (NAMENDA) 5 MG tablet Take 1 tablet (5 mg total) by mouth 2 (two) times daily. 12/14/20   [name], MD  Multiple Vitamin (MULTIVITAMIN) tablet Take 1 tablet by mouth daily.    [provider]  pantoprazole (PROTONIX) 40 MG tablet TAKE 1 TABLET BY MOUTH EVERY DAY 11/14/20   [name], MD  tamsulosin (FLOMAX) 0.4 MG CAPS capsule TAKE 2 CAPSULES BY MOUTH EVERY DAY 07/07/20   [name], MD    Family History Family History  Problem Relation Age of Onset  . Hypertension Mother   . Arthritis Father   . Other Other        no premature CAD.  [name] Colon cancer Neg Hx   . Prostate cancer Neg Hx   . Stomach cancer Neg Hx     Social History Social History   Tobacco  Use  . Smoking status: Never Smoker  . Smokeless tobacco: Never Used  Vaping Use  . Vaping Use: Never used  Substance Use Topics  . Alcohol use: No  . Drug use: No     Allergies   Patient has no known allergies.   Review of Systems Review of Systems  Constitutional: Negative for chills and fever.  Eyes: Positive for itching. Negative for pain, discharge, redness and visual disturbance.  Respiratory: Negative for cough and shortness of breath.   Cardiovascular: Negative for chest pain and palpitations.  Gastrointestinal: Negative for abdominal pain and vomiting.  Genitourinary: Negative for dysuria.  Musculoskeletal: Negative for arthralgias and back pain.  Skin: Positive for rash. Negative for color change.  All other systems reviewed and are negative.    Physical Exam Triage Vital Signs ED Triage Vitals  Enc Vitals Group     BP      Pulse      Resp      Temp      Temp src      SpO2      Weight      Height      Head Circumference      Peak Flow      Pain Score      Pain Loc      Pain Edu?      Excl. in [place]?    No data found.  Updated Vital Signs BP 108/63 (BP Location: Left  Arm)   Pulse (S) (!) 56 Comment: pcp aware pt his baesline  Temp 98.1 F (36.7 C) (Temporal)   Resp 16   SpO2 98%   Visual Acuity Right Eye Distance: (S)  (unable to read 20/100) Left Eye Distance: 20/40 Bilateral Distance: 20/40  Right Eye Near:   Left Eye Near:    Bilateral Near:     Physical Exam Vitals and nursing note reviewed.  Constitutional:      General: He is not in acute distress.    Appearance: He is well-developed.  HENT:     Head: Normocephalic and atraumatic.  Eyes:     Extraocular Movements: Extraocular movements intact.     Conjunctiva/sclera: Conjunctivae normal.     Pupils: Pupils are equal, round, and reactive to light.     Comments: Mild edema of upper eyelids.  Conjunctivae clear.  No drainage or crusting.  Cardiovascular:     Rate and Rhythm: Normal rate and regular rhythm.     Heart sounds: Normal heart sounds.  Pulmonary:     Effort: Pulmonary effort is normal. No respiratory distress.     Breath sounds: Normal breath sounds. No rales.  Abdominal:     Palpations: Abdomen is soft.     Tenderness: There is no abdominal tenderness.  Musculoskeletal:     Cervical back: Neck supple.     Right lower leg: No edema.     Left lower leg: No edema.  Skin:    General: Skin is warm and dry.     Findings: Rash present.     Comments: Erythematous rash on facial cheeks below eyes.  No open wounds or drainage.  No lesions and mouth or eyes.  Neurological:     Mental Status: He is alert. Mental status is at baseline.  Psychiatric:        Mood and Affect: Mood normal.        Behavior: Behavior normal.      UC Treatments / Results  Labs (all labs ordered  are listed, but only abnormal results are displayed) Labs Reviewed - No data to display  EKG   Radiology No results found.  Procedures Procedures (including critical care time)  Medications Ordered in UC Medications - No data to display  Initial Impression / Assessment and Plan / UC Course   I have reviewed the triage vital signs and the nursing notes.  Pertinent labs & imaging results that were available during my care of the patient were reviewed by me and considered in my medical decision making (see chart for details).   Allergic dermatitis due to poison oak.  Treating with prednisone.  Also instructed patient's son to give him Claritin if needed for itching.  Instructed patient's son to follow-up with his PCP if his symptoms or not improving.  ED precautions discussed if patient has worsening symptoms.  Patient's son agrees to plan of care.   Final Clinical Impressions(s) / UC Diagnoses   Final diagnoses:  Allergic dermatitis due to poison oak     Discharge Instructions     Take the prednisone as directed.    Follow up with your primary care provider if your symptoms are not improving.        ED Prescriptions    Medication Sig Dispense Auth. Provider   predniSONE (STERAPRED UNI-PAK 21 TAB) 10 MG (21) TBPK tablet Take by mouth daily. As directed 21 tablet Sharion Balloon, NP     PDMP not reviewed this encounter.   Sharion Balloon, NP 12/29/20 1214

## 2020-12-29 NOTE — Telephone Encounter (Signed)
FYI we have no appointments here today.

## 2020-12-29 NOTE — Telephone Encounter (Signed)
FYI I called son & he already had patient at [facility]. I asked that he keep [place] updated.

## 2020-12-29 NOTE — Telephone Encounter (Signed)
FYI

## 2020-12-29 NOTE — Telephone Encounter (Signed)
PTs son came into the office to inform that PT is now out of UC and he will send a msg through [employer]. If there is anything needed he said yall can just call.

## 2020-12-29 NOTE — Telephone Encounter (Signed)
Opened in error

## 2020-12-29 NOTE — Telephone Encounter (Signed)
Noted. That is understandable.

## 2021-01-02 ENCOUNTER — Other Ambulatory Visit: Payer: PPO

## 2021-01-02 ENCOUNTER — Ambulatory Visit: Payer: PPO | Admitting: Oncology

## 2021-01-03 ENCOUNTER — Other Ambulatory Visit: Payer: Self-pay

## 2021-01-05 ENCOUNTER — Other Ambulatory Visit: Payer: Self-pay | Admitting: Family Medicine

## 2021-01-05 NOTE — Progress Notes (Signed)
[place]  [phone] [phone]  ID: [id number] OKI OB: 10-15-1932  MR#: [id number]  SLH#:[id number]  Patient Care Team: [name], MD as PCP - General (Family Medicine) Rockey Situ Kathlene November, MD as PCP - Cardiology (Cardiology) [name], MD as Consulting Physician (Cardiology) [name], MD as Consulting Physician (Oncology) [name] as Physician Assistant (Urology)  CHIEF COMPLAINT: Recurrent hairy cell leukemia.  INTERVAL HISTORY: Patient returns to clinic today for repeat laboratory work and further evaluation.  He was recently seen in the ED with poison oak and received a steroid taper.  He continues to have baseline confusion, but otherwise feels well.  He continues to have chronic weakness and fatigue.  He has a fair appetite, but his weight is stable.  He does not complain of cough or hoarseness today.  He has no neurologic complaints.  He denies any fevers.  He denies any chest pain, shortness of breath, or hemoptysis.  He denies nausea, vomiting, diarrhea or constipation.  He has no urinary complaints.  Patient offers no further specific complaints today.  REVIEW OF SYSTEMS:   Review of Systems  Constitutional: Positive for malaise/fatigue. Negative for fever and weight loss.  HENT: Negative for sore throat.   Respiratory: Negative.  Negative for cough, hemoptysis and shortness of breath.   Cardiovascular: Negative.  Negative for chest pain and leg swelling.  Gastrointestinal: Negative.  Negative for abdominal pain.  Genitourinary: Negative.  Negative for dysuria.  Musculoskeletal: Negative.  Negative for back pain.  Skin: Negative.  Negative for rash.  Neurological: Positive for weakness. Negative for dizziness, focal weakness and headaches.  Psychiatric/Behavioral: Positive for memory loss. The patient is not nervous/anxious.     As per HPI. Otherwise, a complete review of systems is  negative.  PAST MEDICAL HISTORY: Past Medical History:  Diagnosis Date  . Anemia 09/01/2015  . Anxiety 09/11/2015  . Arthritis   . Asthma    as a teenager  . Asymptomatic Sinus Bradycardia   . Atypical chest pain    a. 08/2014  . BPH (benign prostatic hyperplasia)   . CKD (chronic kidney disease), stage III ([place])   . Closed fracture of tuft of distal phalanx of finger 08/31/2018  . Community acquired pneumonia 08/10/2020  . Coronary artery disease    a. 2000 s/p CABG;  b. 10/2008 Neg MV, EF 64%; c. cath 05/2015: LM 85% sev cal, pLAD-1 lesion 80%, pLAD-2 lesion 100% chronic, ostLCx 70% sev cal, OM4 90%, ostRCA 70%, mid RCA 90%, LIMA-LAD patent, VG-OM2 patent, VG-OM3 patent, VG-RPDA patent. No AS  . Dementia without behavioral disturbance ([place]) 05/28/2019  . GERD (gastroesophageal reflux disease)   . H/O echocardiogram    a. 10/2005 Echo: nl EF.  [name] Hairy cell leukemia ([place])   . HOH (hard of hearing)    Bilateral hearing aids  . HTN (hypertension)   . Hyperlipidemia   . Hypertension   . Leukemia ([place])   . Neutropenic fever ([place]) 11/08/2019  . Stricture and stenosis of esophagus     PAST SURGICAL HISTORY: Past Surgical History:  Procedure Laterality Date  . CARDIAC CATHETERIZATION N/A 05/26/2015   Procedure: Left Heart Cath;  Surgeon: [name], MD;  Location: [place] [facility] LAB;  Service: Cardiovascular;  Laterality: N/A;  . CARDIAC CATHETERIZATION N/A 05/26/2015   Procedure: Coronary/Graft Angiography;  Surgeon: [name], MD;  Location: [place] [facility] LAB;  Service: Cardiovascular;  Laterality: N/A;  . CATARACT  EXTRACTION    . CORONARY ARTERY BYPASS GRAFT  2000  . ESOPHAGOGASTRODUODENOSCOPY N/A 02/18/2017   Procedure: ESOPHAGOGASTRODUODENOSCOPY (EGD) with removal of food bolus;  Surgeon: [name], MD;  Location: [facility] ENDOSCOPY;  Service: Endoscopy;  Laterality: N/A;  . ESOPHAGOGASTRODUODENOSCOPY (EGD) WITH PROPOFOL N/A 03/11/2017   Procedure:  ESOPHAGOGASTRODUODENOSCOPY (EGD) WITH PROPOFOL;  Surgeon: [name], MD;  Location: [facility] ENDOSCOPY;  Service: Endoscopy;  Laterality: N/A;  . ESOPHAGOGASTRODUODENOSCOPY (EGD) WITH PROPOFOL N/A 04/15/2017   Procedure: ESOPHAGOGASTRODUODENOSCOPY (EGD) WITH PROPOFOL;  Surgeon: [name], MD;  Location: [facility] ENDOSCOPY;  Service: Endoscopy;  Laterality: N/A;  . ESOPHAGOGASTRODUODENOSCOPY (EGD) WITH PROPOFOL N/A 09/12/2020   Procedure: ESOPHAGOGASTRODUODENOSCOPY (EGD) WITH PROPOFOL;  Surgeon: Lin Landsman, MD;  Location: [facility] ENDOSCOPY;  Service: Gastroenterology;  Laterality: N/A;  . EYE SURGERY Left    Cataract Extraction with IOL  . INGUINAL HERNIA REPAIR Left 06/24/2016   Procedure: HERNIA REPAIR INGUINAL ADULT;  Surgeon: [name], MD;  Location: [facility] ORS;  Service: General;  Laterality: Left;  . TONSILLECTOMY  1942   . UPPER GASTROINTESTINAL ENDOSCOPY  3 years ago    with Dilation.     FAMILY HISTORY Family History  Problem Relation Age of Onset  . Hypertension Mother   . Arthritis Father   . Other Other        no premature CAD.  [name] Colon cancer Neg Hx   . Prostate cancer Neg Hx   . Stomach cancer Neg Hx        ADVANCED DIRECTIVES:    HEALTH MAINTENANCE: Social History   Tobacco Use  . Smoking status: Never Smoker  . Smokeless tobacco: Never Used  Vaping Use  . Vaping Use: Never used  Substance Use Topics  . Alcohol use: No  . Drug use: No     No Known Allergies  Current Outpatient Medications  Medication Sig Dispense Refill  . aspirin EC 81 MG tablet Take 1 tablet (81 mg total) by mouth daily. Swallow whole. 90 tablet 3  . atorvastatin (LIPITOR) 40 MG tablet TAKE 1/2 TABLETS (20 MG TOTAL) BY MOUTH DAILY. 45 tablet 3  . Calcium Carbonate Antacid (TUMS CHEWY BITES PO) Take 1 tablet by mouth as needed.     . famotidine (PEPCID) 20 MG tablet Take 1 tablet (20 mg total) by mouth daily. 30 tablet 3  . finasteride (PROSCAR) 5 MG tablet TAKE 1 TABLET BY  MOUTH EVERY DAY 30 tablet 1  . isosorbide mononitrate (IMDUR) 30 MG 24 hr tablet Take 1 tablet (30 mg total) by mouth daily. 90 tablet 3  . memantine (NAMENDA) 5 MG tablet TAKE 1 TABLET BY MOUTH TWICE A DAY 180 tablet 1  . Multiple Vitamin (MULTIVITAMIN) tablet Take 1 tablet by mouth daily.    . pantoprazole (PROTONIX) 40 MG tablet TAKE 1 TABLET BY MOUTH EVERY DAY 90 tablet 1  . tamsulosin (FLOMAX) 0.4 MG CAPS capsule TAKE 2 CAPSULES BY MOUTH EVERY DAY 180 capsule 1   No current facility-administered medications for this visit.    OBJECTIVE: Vitals:   01/09/21 0932  BP: (!) 127/47  Pulse: (!) 50  Resp: 18  Temp: 98.2 F (36.8 C)  SpO2: 100%     Body mass index is 23.52 kg/m.    ECOG FS:1 - Symptomatic but completely ambulatory  General: Well-developed, well-nourished, no acute distress. Eyes: Pink conjunctiva, anicteric sclera. HEENT: Normocephalic, moist mucous membranes. Lungs: No audible wheezing or coughing. Heart: Regular rate and rhythm. Abdomen: Soft, nontender, no obvious  distention. Musculoskeletal: No edema, cyanosis, or clubbing. Neuro: Alert, answering all questions appropriately. Cranial nerves grossly intact. Skin: No rashes or petechiae noted. Psych: Normal affect.  LAB RESULTS:  Lab Results  Component Value Date   NA 138 01/09/2021   K 4.3 01/09/2021   CL 103 01/09/2021   CO2 26 01/09/2021   GLUCOSE 75 01/09/2021   BUN 21 01/09/2021   CREATININE 1.38 (H) 01/09/2021   CALCIUM 9.1 01/09/2021   PROT 6.8 11/13/2020   ALBUMIN 4.0 11/13/2020   AST 24 11/13/2020   ALT 10 11/13/2020   ALKPHOS 55 11/13/2020   BILITOT 1.2 11/13/2020   GFRNONAA 50 (L) 01/09/2021   GFRAA 50 (L) 02/08/2020    Lab Results  Component Value Date   WBC 4.8 01/09/2021   NEUTROABS 3.9 01/09/2021   HGB 12.3 (L) 01/09/2021   HCT 36.4 (L) 01/09/2021   MCV 99.5 01/09/2021   PLT 161 01/09/2021     STUDIES: CT Chest Wo Contrast  Result Date: 12/14/2020 CLINICAL DATA:   Follow-up pneumonia. Abnormal chest x-ray 11/13/2020 EXAM: CT CHEST WITHOUT CONTRAST TECHNIQUE: Multidetector CT imaging of the chest was performed following the standard protocol without IV contrast. COMPARISON:  Chest radiograph 11/13/2020 FINDINGS: Cardiovascular: Prior CABG. Mediastinum/Nodes: No axillary or supraclavicular adenopathy. No mediastinal or hilar adenopathy. No pericardial fluid. Esophagus normal. Lungs/Pleura: Band of atelectasis remains in the RIGHT middle lobe. This band is linear and small volume. No airspace disease. No suspicious nodularity. Upper Abdomen: Limited view of the liver, kidneys, pancreas are unremarkable. Normal adrenal glands. Large gallstone. No cholecystitis. Musculoskeletal: Degenerative osteophytosis of the spine. IMPRESSION: 1. Resolution of RIGHT middle lobe pneumonia. 2. Band of atelectasis/scarring remains in the RIGHT middle lobe. 3. No mediastinal adenopathy. 4. Post CABG. Electronically Signed   By: Suzy Bouchard M.D.   On: 12/14/2020 09:39   DG SWALLOW FUNC OP MEDICARE SPEECH PATH  Result Date: 01/04/2021 Please refer to "Notes" tab for Speech Pathology notes. CLINICAL DATA:  Dysphagia. EXAM: MODIFIED BARIUM SWALLOW TECHNIQUE: Different consistencies of barium were administered orally to the patient by the Speech Pathologist. Imaging of the pharynx was performed in the lateral projection. The radiologist was present in the fluoroscopy room for this study, providing personal supervision. FLUOROSCOPY TIME:  Fluoroscopy Time:  2 minutes and 36 seconds. Radiation Exposure Index (if provided by the fluoroscopic device): 7.8 mGy Number of Acquired Spot Images: COMPARISON:  None. FINDINGS: Aspiration was observed with sips of thin barium. Patient was noted to have stasis with thick barium in the vallecular. Full column contrast noted in the esophagus on multiple images suggesting dysmotility or potential distal esophageal stricture. Reflux would also be a consideration  although this barium column did not appear to clear during the study. IMPRESSION: Aspiration of thin barium. Full column contrast material seen in the esophagus on more delayed images potentially related to dysmotility or stricture. Reflux would be a consideration although this did appear to be relatively fixed over multiple later images without evidence of clearing and recurrence. Please refer to the Speech Pathologists report for complete details and recommendations. Electronically Signed   By: Misty Stanley M.D.   On: 12/14/2020 13:32      ONCOLOGY HISTORY:  Patient initially received cladribine in 2006 and then was treated for a relapse in 2011 and again in 2014. Bone marrow biopsy at that time confirmed recurrence of disease with 80-90% hairy cell leukemia noted in his specimen. Patient completed cladribine 0.15 mg/kg on days 1 through 5 along  with 8 weekly cycles of Rituxan on April 18, 2015. Bone marrow biopsy results after completion of 8 weeks of Rituxan without evidence of residual disease.  Patient patient underwent a second round of cladribine 0.15 mg/kg on days 1 through 5 along with 8 weekly cycles of Rituxan completing treatment on September 05, 2020. Patient typically has no evidence of disease and peripheral blood.   ASSESSMENT: Recurrent hairy cell leukemia.  PLAN:   1.  Recurrent hairy cell leukemia: See oncology history as above.  Bone marrow biopsy on November 02, 2019 revealed recurrent hairy cell leukemia, but percentage of involvement was not reported.  Patient completed his most recent treatment with clofarabine and Rituxan on September 05, 2020.  No bone marrow biopsy was completed at the end of treatment.  Pancytopenia has essentially resolved, but suspect this is partially from the steroid taper he completed less than a week ago.  No intervention is needed at this time.  Return to clinic in 3 months with repeat laboratory work and further evaluation. 2. Thrombocytopenia:  Resolved. 3.  Leukopenia: Resolved. 4.  Anemia: Hemoglobin improved to 12.3. 5.  Cardiac disease: Continue follow-up and treatment per Dr. Rockey Situ. 6.  Renal insufficiency: Mild, monitor. 7.  Weight loss: Improved. 8.  Poison oak infection: Resolved with steroid taper.  Patient expressed understanding and was in agreement with this plan. He also understands that He can call clinic at any time with any questions, concerns, or complaints.    [name], MD 01/09/21 10:09 AM

## 2021-01-08 ENCOUNTER — Other Ambulatory Visit: Payer: Self-pay | Admitting: *Deleted

## 2021-01-08 DIAGNOSIS — C9142 Hairy cell leukemia, in relapse: Secondary | ICD-10-CM

## 2021-01-08 NOTE — Progress Notes (Signed)
bc

## 2021-01-09 ENCOUNTER — Inpatient Hospital Stay (HOSPITAL_BASED_OUTPATIENT_CLINIC_OR_DEPARTMENT_OTHER): Payer: PPO | Admitting: Oncology

## 2021-01-09 ENCOUNTER — Ambulatory Visit (INDEPENDENT_AMBULATORY_CARE_PROVIDER_SITE_OTHER): Payer: PPO | Admitting: Family Medicine

## 2021-01-09 ENCOUNTER — Other Ambulatory Visit: Payer: Self-pay

## 2021-01-09 ENCOUNTER — Encounter: Payer: Self-pay | Admitting: Family Medicine

## 2021-01-09 ENCOUNTER — Inpatient Hospital Stay: Payer: PPO | Attending: Oncology

## 2021-01-09 VITALS — BP 127/47 | HR 50 | Temp 98.2°F | Resp 18 | Wt 159.3 lb

## 2021-01-09 DIAGNOSIS — R001 Bradycardia, unspecified: Secondary | ICD-10-CM | POA: Diagnosis not present

## 2021-01-09 DIAGNOSIS — F0390 Unspecified dementia without behavioral disturbance: Secondary | ICD-10-CM

## 2021-01-09 DIAGNOSIS — R531 Weakness: Secondary | ICD-10-CM | POA: Insufficient documentation

## 2021-01-09 DIAGNOSIS — N1831 Chronic kidney disease, stage 3a: Secondary | ICD-10-CM | POA: Diagnosis not present

## 2021-01-09 DIAGNOSIS — K219 Gastro-esophageal reflux disease without esophagitis: Secondary | ICD-10-CM | POA: Insufficient documentation

## 2021-01-09 DIAGNOSIS — E785 Hyperlipidemia, unspecified: Secondary | ICD-10-CM | POA: Diagnosis not present

## 2021-01-09 DIAGNOSIS — I2510 Atherosclerotic heart disease of native coronary artery without angina pectoris: Secondary | ICD-10-CM | POA: Insufficient documentation

## 2021-01-09 DIAGNOSIS — C9141 Hairy cell leukemia, in remission: Secondary | ICD-10-CM | POA: Diagnosis not present

## 2021-01-09 DIAGNOSIS — Z8249 Family history of ischemic heart disease and other diseases of the circulatory system: Secondary | ICD-10-CM | POA: Diagnosis not present

## 2021-01-09 DIAGNOSIS — R413 Other amnesia: Secondary | ICD-10-CM | POA: Insufficient documentation

## 2021-01-09 DIAGNOSIS — Z79899 Other long term (current) drug therapy: Secondary | ICD-10-CM | POA: Insufficient documentation

## 2021-01-09 DIAGNOSIS — C9142 Hairy cell leukemia, in relapse: Secondary | ICD-10-CM

## 2021-01-09 DIAGNOSIS — Z8261 Family history of arthritis: Secondary | ICD-10-CM | POA: Insufficient documentation

## 2021-01-09 DIAGNOSIS — I129 Hypertensive chronic kidney disease with stage 1 through stage 4 chronic kidney disease, or unspecified chronic kidney disease: Secondary | ICD-10-CM | POA: Insufficient documentation

## 2021-01-09 DIAGNOSIS — R1310 Dysphagia, unspecified: Secondary | ICD-10-CM | POA: Insufficient documentation

## 2021-01-09 DIAGNOSIS — L237 Allergic contact dermatitis due to plants, except food: Secondary | ICD-10-CM | POA: Diagnosis not present

## 2021-01-09 DIAGNOSIS — C9140 Hairy cell leukemia not having achieved remission: Secondary | ICD-10-CM | POA: Insufficient documentation

## 2021-01-09 DIAGNOSIS — K8020 Calculus of gallbladder without cholecystitis without obstruction: Secondary | ICD-10-CM | POA: Diagnosis not present

## 2021-01-09 DIAGNOSIS — R634 Abnormal weight loss: Secondary | ICD-10-CM | POA: Diagnosis not present

## 2021-01-09 DIAGNOSIS — N400 Enlarged prostate without lower urinary tract symptoms: Secondary | ICD-10-CM | POA: Diagnosis not present

## 2021-01-09 LAB — BASIC METABOLIC PANEL
Anion gap: 9 (ref 5–15)
BUN: 21 mg/dL (ref 8–23)
CO2: 26 mmol/L (ref 22–32)
Calcium: 9.1 mg/dL (ref 8.9–10.3)
Chloride: 103 mmol/L (ref 98–111)
Creatinine, Ser: 1.38 mg/dL — ABNORMAL HIGH (ref 0.61–1.24)
GFR, Estimated: 50 mL/min — ABNORMAL LOW (ref >60)
Glucose, Bld: 75 mg/dL (ref 70–99)
Potassium: 4.3 mmol/L (ref 3.5–5.1)
Sodium: 138 mmol/L (ref 135–145)

## 2021-01-09 LAB — CBC WITH DIFFERENTIAL/PLATELET
Abs Immature Granulocytes: 0.07 10*3/uL (ref 0.00–0.07)
Basophils Absolute: 0 10*3/uL (ref 0.0–0.1)
Basophils Relative: 0 %
Eosinophils Absolute: 0.2 10*3/uL (ref 0.0–0.5)
Eosinophils Relative: 5 %
HCT: 36.4 % — ABNORMAL LOW (ref 39.0–52.0)
Hemoglobin: 12.3 g/dL — ABNORMAL LOW (ref 13.0–17.0)
Immature Granulocytes: 2 %
Lymphocytes Relative: 6 %
Lymphs Abs: 0.3 10*3/uL — ABNORMAL LOW (ref 0.7–4.0)
MCH: 33.6 pg (ref 26.0–34.0)
MCHC: 33.8 g/dL (ref 30.0–36.0)
MCV: 99.5 fL (ref 80.0–100.0)
Monocytes Absolute: 0.3 10*3/uL (ref 0.1–1.0)
Monocytes Relative: 7 %
Neutro Abs: 3.9 10*3/uL (ref 1.7–7.7)
Neutrophils Relative %: 80 %
Platelets: 161 10*3/uL (ref 150–400)
RBC: 3.66 MIL/uL — ABNORMAL LOW (ref 4.22–5.81)
RDW: 13 % (ref 11.5–15.5)
WBC: 4.8 10*3/uL (ref 4.0–10.5)
nRBC: 0 % (ref 0.0–0.2)

## 2021-01-09 NOTE — Assessment & Plan Note (Addendum)
Chronic issue.  Stable.  Patient is asymptomatic.  They will monitor for development of chest pain, shortness of breath, or fatigue.

## 2021-01-09 NOTE — Assessment & Plan Note (Addendum)
Chronic issue.  Generally stable.  He will continue [employer]

## 2021-01-09 NOTE — Progress Notes (Signed)
[name], MD Phone: [phone]  Jasyah GEVORG BRUM is a 87 y.o. male who presents today for follow-up.  Dementia: The patient had worsening memory issues once we stop the Aricept after his last visit.  We ended up starting him on Namenda and his son thinks its been helpful.  He certainly has not worsened since taking this.  Bradycardia: Previously Aricept was discontinued to see if that would help.  He has been on Namenda which does carry the same risk though his heart rate has been stable in the low 50s.  He notes no chest pain, shortness of breath, or fatigue.  The patient's son notes the patient's sitter ended up with COVID with a positive test on 01/01/2021.  They had not been around her since May 17.  The patient's son wonders how long she has to be out of the house.  Social History   Tobacco Use  Smoking Status Never Smoker  Smokeless Tobacco Never Used    Current Outpatient Medications on File Prior to Visit  Medication Sig Dispense Refill  . aspirin EC 81 MG tablet Take 1 tablet (81 mg total) by mouth daily. Swallow whole. 90 tablet 3  . atorvastatin (LIPITOR) 40 MG tablet TAKE 1/2 TABLETS (20 MG TOTAL) BY MOUTH DAILY. 45 tablet 3  . Calcium Carbonate Antacid (TUMS CHEWY BITES PO) Take 1 tablet by mouth as needed.     . famotidine (PEPCID) 20 MG tablet Take 1 tablet (20 mg total) by mouth daily. 30 tablet 3  . finasteride (PROSCAR) 5 MG tablet TAKE 1 TABLET BY MOUTH EVERY DAY 30 tablet 1  . isosorbide mononitrate (IMDUR) 30 MG 24 hr tablet Take 1 tablet (30 mg total) by mouth daily. 90 tablet 3  . memantine (NAMENDA) 5 MG tablet TAKE 1 TABLET BY MOUTH TWICE A DAY 180 tablet 1  . Multiple Vitamin (MULTIVITAMIN) tablet Take 1 tablet by mouth daily.    . pantoprazole (PROTONIX) 40 MG tablet TAKE 1 TABLET BY MOUTH EVERY DAY 90 tablet 1  . tamsulosin (FLOMAX) 0.4 MG CAPS capsule TAKE 2 CAPSULES BY MOUTH EVERY DAY 180 capsule 1   No current facility-administered medications  on file prior to visit.     ROS see history of present illness  Objective  Physical Exam Vitals:   01/09/21 1233  BP: 120/70  Pulse: (!) 51  Temp: 98.3 F (36.8 C)  SpO2: 97%    BP Readings from Last 3 Encounters:  01/09/21 120/70  01/09/21 (!) 127/47  12/29/20 108/63   Wt Readings from Last 3 Encounters:  01/09/21 159 lb 6.4 oz (72.3 kg)  01/09/21 159 lb 4.8 oz (72.3 kg)  11/27/20 158 lb (71.7 kg)    Physical Exam Constitutional:      General: He is not in acute distress.    Appearance: He is not diaphoretic.  Cardiovascular:     Rate and Rhythm: Normal rate and regular rhythm.     Heart sounds: Normal heart sounds.  Pulmonary:     Effort: Pulmonary effort is normal.  Skin:    General: Skin is warm and dry.  Neurological:     Mental Status: He is alert.      Assessment/Plan: Please see individual problem list.  Problem List Items Addressed This Visit    Bradycardia    Chronic issue.  Stable.  Patient is asymptomatic.  They will monitor for development of chest pain, shortness of breath, or fatigue.      Dementia without behavioral  disturbance (HCC)    Chronic issue.  Generally stable.  He will continue Namenda        I advised it is difficult to say exactly how long the patients that are would have to be out of the home without knowing her medical history though in general it would be at least 10 days though could be up to 21 days depending on her medical situation.  I encouraged the patient's son to follow-up with the agency regarding when the sitter could return to the home.  Return in about 3 months (around 04/11/2021) for Dementia.  This visit occurred during the SARS-CoV-2 public health emergency.  Safety protocols were in place, including screening questions prior to the visit, additional usage of staff PPE, and extensive cleaning of exam room while observing appropriate contact time as indicated for disinfecting solutions.    [name],  MD [place]

## 2021-02-09 ENCOUNTER — Other Ambulatory Visit: Payer: Self-pay | Admitting: Family Medicine

## 2021-03-05 ENCOUNTER — Other Ambulatory Visit: Payer: Self-pay | Admitting: Family Medicine

## 2021-03-12 ENCOUNTER — Ambulatory Visit: Payer: PPO | Admitting: Family Medicine

## 2021-03-28 ENCOUNTER — Other Ambulatory Visit: Payer: Self-pay | Admitting: Family Medicine

## 2021-03-28 ENCOUNTER — Encounter: Payer: Self-pay | Admitting: Family Medicine

## 2021-04-10 ENCOUNTER — Other Ambulatory Visit: Payer: PPO

## 2021-04-10 ENCOUNTER — Ambulatory Visit: Payer: PPO | Admitting: Oncology

## 2021-04-14 NOTE — Progress Notes (Signed)
[place]  [phone] [phone]  ID: Frank Mcdaniel OB: 1933-04-08  MR#: [id number]  OBS#:[id number]  Patient Care Team: [name], MD as PCP - General (Family Medicine) Rockey Situ Kathlene November, MD as PCP - Cardiology (Cardiology) [name], MD as Consulting Physician (Cardiology) [name], MD as Consulting Physician (Oncology) [name] as Physician Assistant (Urology)  CHIEF COMPLAINT: Recurrent hairy cell leukemia.  INTERVAL HISTORY: Patient returns to clinic today for repeat laboratory work and routine 9-monthevaluation.  He currently feels well and is at his baseline.  He continues to have baseline confusion and much of the history is given by his son.  He continues to have chronic weakness and fatigue.  He has a fair appetite, but his weight is stable.  He does not complain of cough or hoarseness today.  He has no neurologic complaints.  He denies any fevers.  He denies any chest pain, shortness of breath, or hemoptysis.  He denies nausea, vomiting, diarrhea or constipation.  He has no urinary complaints.  Patient offers no further specific complaints today.  REVIEW OF SYSTEMS:   Review of Systems  Constitutional:  Positive for malaise/fatigue. Negative for fever and weight loss.  HENT:  Negative for sore throat.   Respiratory: Negative.  Negative for cough, hemoptysis and shortness of breath.   Cardiovascular: Negative.  Negative for chest pain and leg swelling.  Gastrointestinal: Negative.  Negative for abdominal pain.  Genitourinary: Negative.  Negative for dysuria.  Musculoskeletal: Negative.  Negative for back pain.  Skin: Negative.  Negative for rash.  Neurological:  Positive for weakness. Negative for dizziness, focal weakness and headaches.  Psychiatric/Behavioral:  Positive for memory loss. The patient is not nervous/anxious.    As per HPI. Otherwise, a complete review of systems is  negative.  PAST MEDICAL HISTORY: Past Medical History:  Diagnosis Date   Anemia 09/01/2015   Anxiety 09/11/2015   Arthritis    Asthma    as a teenager   Asymptomatic Sinus Bradycardia    Atypical chest pain    a. 08/2014   BPH (benign prostatic hyperplasia)    CKD (chronic kidney disease), stage III (HCC)    Closed fracture of tuft of distal phalanx of finger 08/31/2018   Community acquired pneumonia 08/10/2020   Coronary artery disease    a. 2000 s/p CABG;  b. 10/2008 Neg MV, EF 64%; c. cath 05/2015: LM 85% sev cal, pLAD-1 lesion 80%, pLAD-2 lesion 100% chronic, ostLCx 70% sev cal, OM4 90%, ostRCA 70%, mid RCA 90%, LIMA-LAD patent, VG-OM2 patent, VG-OM3 patent, VG-RPDA patent. No AS   [place] without behavioral disturbance ([place] 05/28/2019   GERD (gastroesophageal reflux disease)    H/O echocardiogram    a. 10/2005 Echo: nl EF.   Hairy cell leukemia (HCC)    HOH (hard of hearing)    Bilateral hearing aids   HTN (hypertension)    Hyperlipidemia    Hypertension    Leukemia ([place]    Neutropenic fever ([place] 11/08/2019   Stricture and stenosis of esophagus     PAST SURGICAL HISTORY: Past Surgical History:  Procedure Laterality Date   CARDIAC CATHETERIZATION N/A 05/26/2015   Procedure: Left Heart Cath;  Surgeon: TMinna Merritts MD;  [facility];  Service: Cardiovascular;  Laterality: N/A;   CARDIAC CATHETERIZATION N/A 05/26/2015   Procedure: Coronary/Graft Angiography;  Surgeon: TMinna Merritts MD;  [facility];  Service: Cardiovascular;  Laterality:  N/A;   CATARACT EXTRACTION     CORONARY ARTERY BYPASS GRAFT  2000   ESOPHAGOGASTRODUODENOSCOPY N/A 02/18/2017   Procedure: ESOPHAGOGASTRODUODENOSCOPY (EGD) with removal of food bolus;  Surgeon: [name], MD;  [facility] ENDOSCOPY;  Service: Endoscopy;  Laterality: N/A;   ESOPHAGOGASTRODUODENOSCOPY (EGD) WITH PROPOFOL N/A 03/11/2017   Procedure: ESOPHAGOGASTRODUODENOSCOPY (EGD) WITH PROPOFOL;   Surgeon: [name], MD;  Location: [facility] ENDOSCOPY;  Service: Endoscopy;  Laterality: N/A;   ESOPHAGOGASTRODUODENOSCOPY (EGD) WITH PROPOFOL N/A 04/15/2017   Procedure: ESOPHAGOGASTRODUODENOSCOPY (EGD) WITH PROPOFOL;  Surgeon: [name], MD;  Location: [facility] ENDOSCOPY;  Service: Endoscopy;  Laterality: N/A;   ESOPHAGOGASTRODUODENOSCOPY (EGD) WITH PROPOFOL N/A 09/12/2020   Procedure: ESOPHAGOGASTRODUODENOSCOPY (EGD) WITH PROPOFOL;  Surgeon: Lin Landsman, MD;  Location[facility] END[facility]COPY;  Service: Gastroenterology;  Laterality: N/A;   EYE SURGERY Left    Cataract Extraction with IOL   INGUINAL HERNIA REPAIR Left 06/24/2016   Procedure: HERNIA REPAIR INGUINAL ADULT;  Surgeon: [name], MD;  Location: [facility] ORS;  Service: General;  Laterality: Left;   TONSILLECTOMY  1942    UPPER GASTROINTESTINAL ENDOSCOPY  3 years ago    with Dilation.     FAMILY HISTORY Family History  Problem Relation Age of Onset   Hypertension Mother    Arthritis Father    Other Other        no premature CAD.   Colon cancer Neg Hx    Prostate cancer Neg Hx    Stomach cancer Neg Hx        ADVANCED DIRECTIVES:    HEALTH MAINTENANCE: Social History   Tobacco Use   Smoking status: Never   Smokeless tobacco: Never  Vaping Use   Vaping Use: Never used  Substance Use Topics   Alcohol use: No   Drug use: No     No Known Allergies  Current Outpatient Medications  Medication Sig Dispense Refill   aspirin EC 81 MG tablet Take 1 tablet (81 mg total) by mouth daily. Swallow whole. 90 tablet 3   atorvastatin (LIPITOR) 40 MG tablet TAKE 1/2 TABLETS (20 MG TOTAL) BY MOUTH DAILY. 45 tablet 3   Calcium Carbonate Antacid (TUMS CHEWY BITES PO) Take 1 tablet by mouth as needed.      famotidine (PEPCID) 20 MG tablet TAKE 1 TABLET BY MOUTH EVERY DAY 90 tablet 3   finasteride (PROSCAR) 5 MG tablet TAKE 1 TABLET BY MOUTH EVERY DAY 90 tablet 3   isosorbide mononitrate (IMDUR) 30 MG 24 hr tablet Take 1 tablet  (30 mg total) by mouth daily. 90 tablet 3   memantine (NAMENDA) 5 MG tablet TAKE 1 TABLET BY MOUTH TWICE A DAY 180 tablet 1   Multiple Vitamin (MULTIVITAMIN) tablet Take 1 tablet by mouth daily.     pantoprazole (PROTONIX) 40 MG tablet TAKE 1 TABLET BY MOUTH EVERY DAY 90 tablet 1   tamsulosin (FLOMAX) 0.4 MG CAPS capsule TAKE 2 CAPSULES BY MOUTH EVERY DAY 180 capsule 1   No current facility-administered medications for this visit.    OBJECTIVE: Vitals:   04/19/21 1112  BP: (!) 129/54  Pulse: 63  Resp: 16  Temp: (!) 97.5 F (36.4 C)  SpO2: 99%     Body mass index is 24.38 kg/m.    ECOG FS:1 - Symptomatic but completely ambulatory  General: Well-developed, well-nourished, no acute distress. Eyes: Pink conjunctiva, anicteric sclera. HEENT: Normocephalic, moist mucous membranes. Lungs: No audible wheezing or coughing. Heart: Regular rate and rhythm. Abdomen: Soft, nontender, no obvious  distention. Musculoskeletal: No edema, cyanosis, or clubbing. Neuro: Alert, answering all questions appropriately. Cranial nerves grossly intact. Skin: No rashes or petechiae noted. Psych: Normal affect.   LAB RESULTS:  Lab Results  Component Value Date   NA 140 04/19/2021   K 3.9 04/19/2021   CL 107 04/19/2021   CO2 27 04/19/2021   GLUCOSE 94 04/19/2021   BUN 21 04/19/2021   CREATININE 1.50 (H) 04/19/2021   CALCIUM 9.0 04/19/2021   PROT 6.8 11/13/2020   ALBUMIN 4.0 11/13/2020   AST 24 11/13/2020   ALT 10 11/13/2020   ALKPHOS 55 11/13/2020   BILITOT 1.2 11/13/2020   GFRNONAA 45 (L) 04/19/2021   GFRAA 50 (L) 02/08/2020    Lab Results  Component Value Date   WBC 4.4 04/19/2021   NEUTROABS 3.3 04/19/2021   HGB 11.6 (L) 04/19/2021   HCT 34.5 (L) 04/19/2021   MCV 100.3 (H) 04/19/2021   PLT 159 04/19/2021     STUDIES: No results found.     ONCOLOGY HISTORY:  Patient initially received cladribine in 2006 and then was treated for a relapse in 2011 and again in 2014. Bone  marrow biopsy at that time confirmed recurrence of disease with 80-90% hairy cell leukemia noted in his specimen. Patient completed cladribine 0.15 mg/kg on days 1 through 5 along with 8 weekly cycles of Rituxan on April 18, 2015. Bone marrow biopsy results after completion of 8 weeks of Rituxan without evidence of residual disease.  Patient patient underwent a second round of cladribine 0.15 mg/kg on days 1 through 5 along with 8 weekly cycles of Rituxan completing treatment on September 05, 2020. Patient typically has no evidence of disease and peripheral blood.   ASSESSMENT: Recurrent hairy cell leukemia.  PLAN:   1.  Recurrent hairy cell leukemia: See oncology history as above.  Bone marrow biopsy on November 02, 2019 revealed recurrent hairy cell leukemia, but percentage of involvement was not reported.  Patient completed his most recent treatment with clofarabine and Rituxan on September 05, 2020.  No bone marrow biopsy was completed at the end of treatment.  Pancytopenia has essentially resolved, except for anemia.  No intervention is needed at this time.  Return to clinic in 3 months with repeat laboratory work only and then in 6 months for laboratory work and further evaluations.  2. Thrombocytopenia: Resolved. 3.  Leukopenia: Resolved. 4.  Anemia: Hemoglobin is trended down slightly to 11.6, monitor. 5.  Cardiac disease: Continue follow-up and treatment per Dr. Rockey Situ. 6.  Renal insufficiency: Chronic and unchanged.  Patient's creatinine is 1.5 today.   Patient expressed understanding and was in agreement with this plan. He also understands that He can call clinic at any time with any questions, concerns, or complaints.    [name], MD 04/20/21 5:08 PM

## 2021-04-17 ENCOUNTER — Ambulatory Visit (INDEPENDENT_AMBULATORY_CARE_PROVIDER_SITE_OTHER): Payer: PPO | Admitting: Family Medicine

## 2021-04-17 ENCOUNTER — Encounter: Payer: Self-pay | Admitting: Family Medicine

## 2021-04-17 ENCOUNTER — Other Ambulatory Visit: Payer: Self-pay

## 2021-04-17 VITALS — BP 112/70 | HR 86 | Ht 69.02 in | Wt 166.2 lb

## 2021-04-17 DIAGNOSIS — I10 Essential (primary) hypertension: Secondary | ICD-10-CM | POA: Diagnosis not present

## 2021-04-17 DIAGNOSIS — Z23 Encounter for immunization: Secondary | ICD-10-CM | POA: Diagnosis not present

## 2021-04-17 DIAGNOSIS — R1314 Dysphagia, pharyngoesophageal phase: Secondary | ICD-10-CM | POA: Diagnosis not present

## 2021-04-17 DIAGNOSIS — K219 Gastro-esophageal reflux disease without esophagitis: Secondary | ICD-10-CM

## 2021-04-17 DIAGNOSIS — F0390 Unspecified dementia without behavioral disturbance: Secondary | ICD-10-CM | POA: Diagnosis not present

## 2021-04-17 NOTE — Telephone Encounter (Signed)
Noted  

## 2021-04-17 NOTE — Patient Instructions (Signed)
Nice to see you. Please continue with nectar thick liquids and aspiration precautions.

## 2021-04-17 NOTE — Assessment & Plan Note (Signed)
Asymptomatic.  He will continue Protonix 40 mg daily.

## 2021-04-17 NOTE — Assessment & Plan Note (Signed)
Chronic issue.  Generally stable.  He will continue Namenda 5 mg twice daily.

## 2021-04-17 NOTE — Assessment & Plan Note (Signed)
Well-controlled.  They will continue Imdur 30 mg once daily.

## 2021-04-17 NOTE — Progress Notes (Signed)
[name], MD Phone: [phone]  Frank Mcdaniel is a 87 y.o. male who presents today for follow-up.  Dementia: The patient son notes this is stable.  The Namenda has helped stabilize things.  Hypertension: Not checking blood pressures at home.  He is taking Imdur.  No chest pain, shortness of breath, or edema.  GERD: Patient is on Protonix.  No reflux.  No abdominal pain.  No blood in the stool.  No sensation of food getting stuck.  Pharyngoesophageal phase dysphagia: The patient had evaluation by speech pathology.  They advised nectar consistency liquids and strict aspiration precautions.  The patient's son has not noticed any difference with the patient's coughing while eating with the use of nectar consistency liquids.  He has not had any recurrent issues with aspiration or pneumonia recently.  Social History   Tobacco Use  Smoking Status Never  Smokeless Tobacco Never    Current Outpatient Medications on File Prior to Visit  Medication Sig Dispense Refill   aspirin EC 81 MG tablet Take 1 tablet (81 mg total) by mouth daily. Swallow whole. 90 tablet 3   atorvastatin (LIPITOR) 40 MG tablet TAKE 1/2 TABLETS (20 MG TOTAL) BY MOUTH DAILY. 45 tablet 3   Calcium Carbonate Antacid (TUMS CHEWY BITES PO) Take 1 tablet by mouth as needed.      famotidine (PEPCID) 20 MG tablet TAKE 1 TABLET BY MOUTH EVERY DAY 90 tablet 3   finasteride (PROSCAR) 5 MG tablet TAKE 1 TABLET BY MOUTH EVERY DAY 90 tablet 3   isosorbide mononitrate (IMDUR) 30 MG 24 hr tablet Take 1 tablet (30 mg total) by mouth daily. 90 tablet 3   memantine (NAMENDA) 5 MG tablet TAKE 1 TABLET BY MOUTH TWICE A DAY 180 tablet 1   Multiple Vitamin (MULTIVITAMIN) tablet Take 1 tablet by mouth daily.     pantoprazole (PROTONIX) 40 MG tablet TAKE 1 TABLET BY MOUTH EVERY DAY 90 tablet 1   tamsulosin (FLOMAX) 0.4 MG CAPS capsule TAKE 2 CAPSULES BY MOUTH EVERY DAY 180 capsule 1   No current facility-administered medications on  file prior to visit.     ROS see history of present illness  Objective  Physical Exam Vitals:   04/17/21 1228  BP: 112/70  Pulse: 86  SpO2: 96%    BP Readings from Last 3 Encounters:  04/17/21 112/70  01/09/21 120/70  01/09/21 (!) 127/47   Wt Readings from Last 3 Encounters:  04/17/21 166 lb 3.2 oz (75.4 kg)  01/09/21 159 lb 6.4 oz (72.3 kg)  01/09/21 159 lb 4.8 oz (72.3 kg)    Physical Exam Constitutional:      General: He is not in acute distress.    Appearance: He is not diaphoretic.  Cardiovascular:     Rate and Rhythm: Normal rate and regular rhythm.     Heart sounds: Normal heart sounds.  Pulmonary:     Effort: Pulmonary effort is normal.     Breath sounds: Normal breath sounds.  Abdominal:     General: There is no distension.     Tenderness: There is no abdominal tenderness.  Skin:    General: Skin is warm and dry.  Neurological:     Mental Status: He is alert.     Assessment/Plan: Please see individual problem list.  Problem List Items Addressed This Visit     HTN (hypertension) (Chronic)    Well-controlled.  They will continue Imdur 30 mg once daily.      Dementia without  behavioral disturbance (HCC)    Chronic issue.  Generally stable.  He will continue Namenda 5 mg twice daily.      GERD (gastroesophageal reflux disease)    Asymptomatic.  He will continue Protonix 40 mg daily.      Pharyngoesophageal phase dysphagia    Continue nectar thick liquids and aspiration precautions.      Other Visit Diagnoses     Need for influenza vaccination    -  Primary   Relevant Orders   Flu Vaccine QUAD High Dose(Fluad) (Completed)       Return in about 6 months (around 10/15/2021).  This visit occurred during the SARS-CoV-2 public health emergency.  Safety protocols were in place, including screening questions prior to the visit, additional usage of staff PPE, and extensive cleaning of exam room while observing appropriate contact time as  indicated for disinfecting solutions.    [name], MD [place]

## 2021-04-17 NOTE — Assessment & Plan Note (Signed)
Continue nectar thick liquids and aspiration precautions.

## 2021-04-19 ENCOUNTER — Inpatient Hospital Stay: Payer: PPO | Attending: Oncology | Admitting: Oncology

## 2021-04-19 ENCOUNTER — Inpatient Hospital Stay: Payer: PPO

## 2021-04-19 VITALS — BP 129/54 | HR 63 | Temp 97.5°F | Resp 16 | Wt 165.2 lb

## 2021-04-19 DIAGNOSIS — C9140 Hairy cell leukemia not having achieved remission: Secondary | ICD-10-CM | POA: Diagnosis not present

## 2021-04-19 DIAGNOSIS — D649 Anemia, unspecified: Secondary | ICD-10-CM | POA: Insufficient documentation

## 2021-04-19 DIAGNOSIS — N189 Chronic kidney disease, unspecified: Secondary | ICD-10-CM | POA: Diagnosis not present

## 2021-04-19 DIAGNOSIS — C9142 Hairy cell leukemia, in relapse: Secondary | ICD-10-CM

## 2021-04-19 DIAGNOSIS — I519 Heart disease, unspecified: Secondary | ICD-10-CM | POA: Insufficient documentation

## 2021-04-19 DIAGNOSIS — C9141 Hairy cell leukemia, in remission: Secondary | ICD-10-CM | POA: Diagnosis not present

## 2021-04-19 LAB — CBC WITH DIFFERENTIAL/PLATELET
Abs Immature Granulocytes: 0.06 10*3/uL (ref 0.00–0.07)
Basophils Absolute: 0 10*3/uL (ref 0.0–0.1)
Basophils Relative: 1 %
Eosinophils Absolute: 0.2 10*3/uL (ref 0.0–0.5)
Eosinophils Relative: 5 %
HCT: 34.5 % — ABNORMAL LOW (ref 39.0–52.0)
Hemoglobin: 11.6 g/dL — ABNORMAL LOW (ref 13.0–17.0)
Immature Granulocytes: 1 %
Lymphocytes Relative: 7 %
Lymphs Abs: 0.3 10*3/uL — ABNORMAL LOW (ref 0.7–4.0)
MCH: 33.7 pg (ref 26.0–34.0)
MCHC: 33.6 g/dL (ref 30.0–36.0)
MCV: 100.3 fL — ABNORMAL HIGH (ref 80.0–100.0)
Monocytes Absolute: 0.5 10*3/uL (ref 0.1–1.0)
Monocytes Relative: 12 %
Neutro Abs: 3.3 10*3/uL (ref 1.7–7.7)
Neutrophils Relative %: 74 %
Platelets: 159 10*3/uL (ref 150–400)
RBC: 3.44 MIL/uL — ABNORMAL LOW (ref 4.22–5.81)
RDW: 12.9 % (ref 11.5–15.5)
WBC: 4.4 10*3/uL (ref 4.0–10.5)
nRBC: 0 % (ref 0.0–0.2)

## 2021-04-19 LAB — BASIC METABOLIC PANEL
Anion gap: 6 (ref 5–15)
BUN: 21 mg/dL (ref 8–23)
CO2: 27 mmol/L (ref 22–32)
Calcium: 9 mg/dL (ref 8.9–10.3)
Chloride: 107 mmol/L (ref 98–111)
Creatinine, Ser: 1.5 mg/dL — ABNORMAL HIGH (ref 0.61–1.24)
GFR, Estimated: 45 mL/min — ABNORMAL LOW (ref >60)
Glucose, Bld: 94 mg/dL (ref 70–99)
Potassium: 3.9 mmol/L (ref 3.5–5.1)
Sodium: 140 mmol/L (ref 135–145)

## 2021-04-19 NOTE — Progress Notes (Signed)
Pt has no complaints/concerns at this time.

## 2021-04-20 ENCOUNTER — Encounter: Payer: Self-pay | Admitting: Oncology

## 2021-05-31 ENCOUNTER — Ambulatory Visit: Payer: PPO

## 2021-06-07 ENCOUNTER — Encounter: Payer: Self-pay | Admitting: Family Medicine

## 2021-06-08 IMAGING — CT CT ANGIO CHEST
2 of 6 series · 19 of 46 positions shown · IV contrast (omnipaque)
Comparison: 01/13/2015

CLINICAL DATA: Hypoxia, fever

EXAM:
CT ANGIOGRAPHY CHEST WITH CONTRAST
TECHNIQUE: Multidetector CT imaging of the chest was performed using the
standard protocol during bolus administration of intravenous
contrast. Multiplanar CT image reconstructions and MIPs were
obtained to evaluate the vascular anatomy.
CONTRAST:  60mL OMNIPAQUE IOHEXOL 350 MG/ML SOLN

[Series 5: thins · axial · 0.72mm/px · z∈[-380,-141]mm · 17 of 263 slices shown]
[im 12/263  lung]
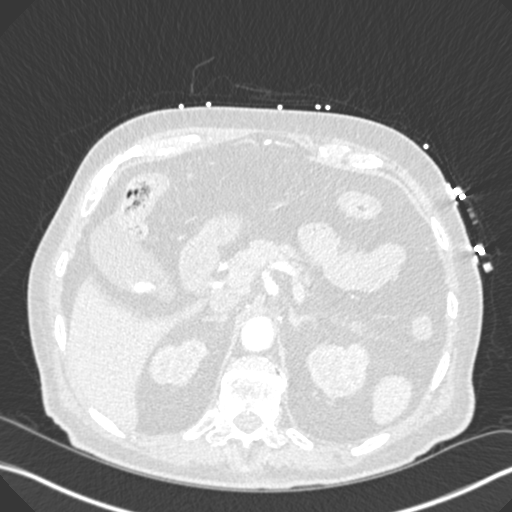
[im 23/263  soft-tissue]
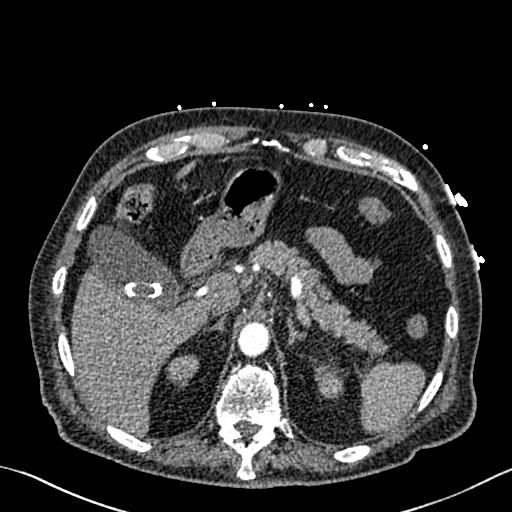
[im 46/263  lung]
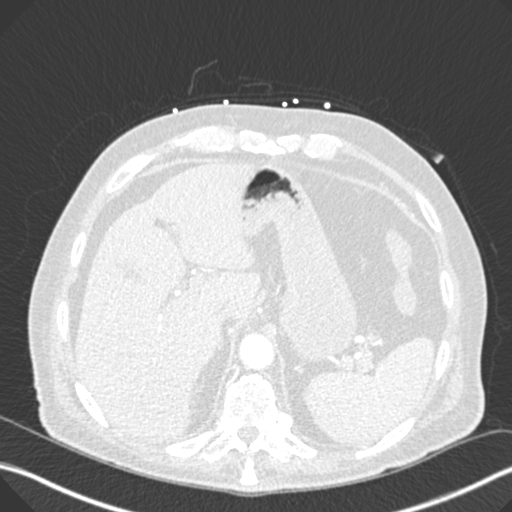
[im 57/263  soft-tissue]
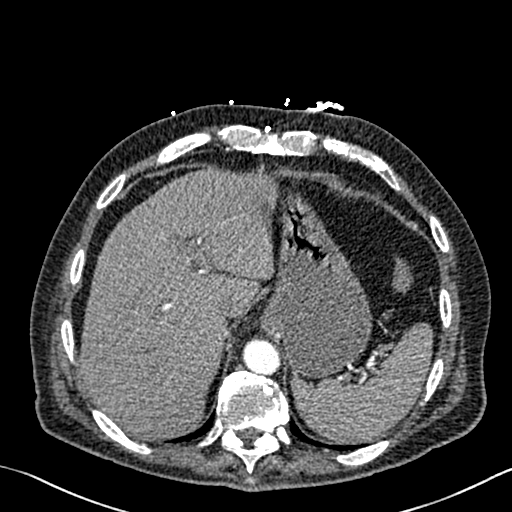
[im 69/263  lung]
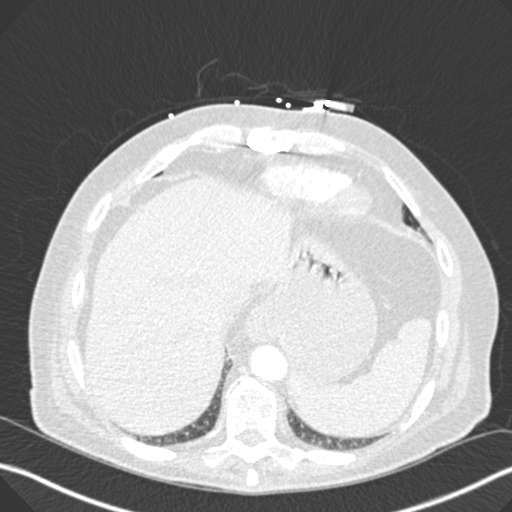
[im 92/263  soft-tissue]
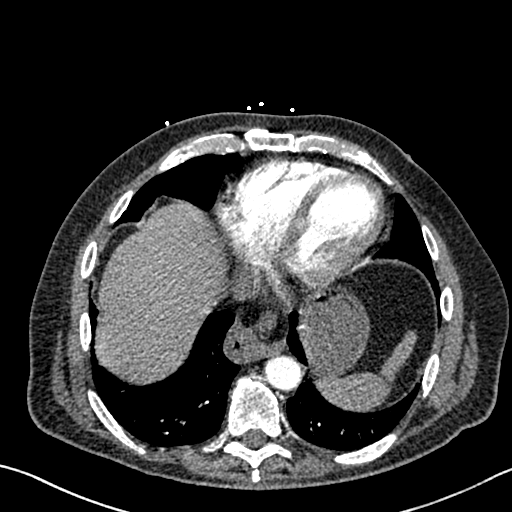
[im 103/263  lung]
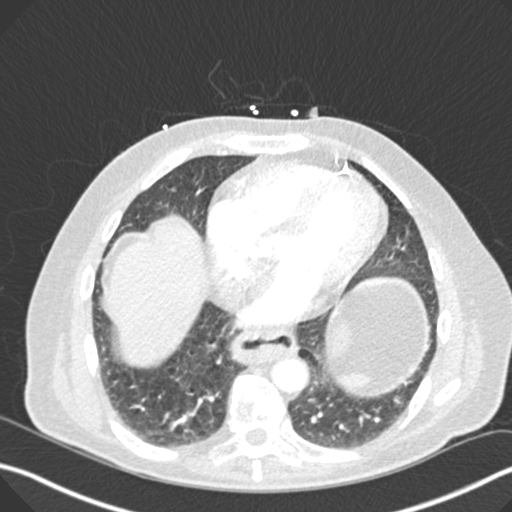
[im 114/263  soft-tissue]
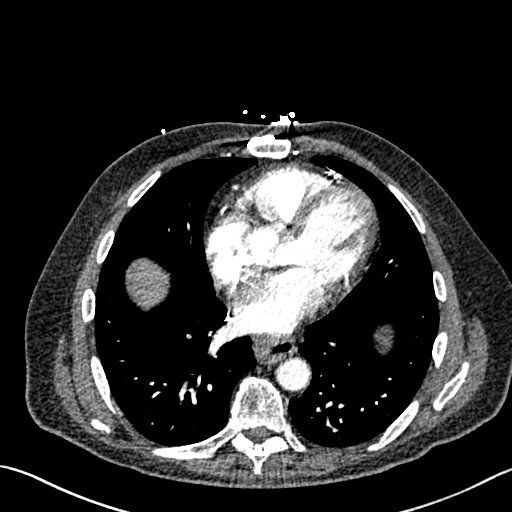
[im 137/263  lung]
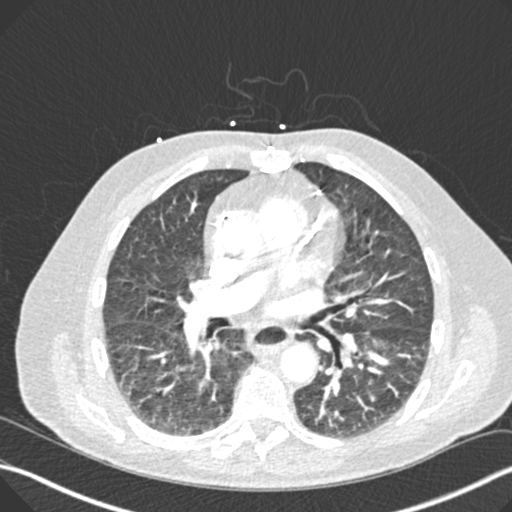
[im 149/263  soft-tissue]
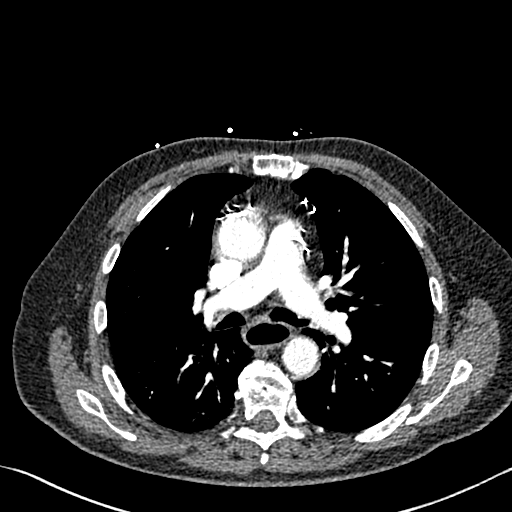
[im 160/263  lung]
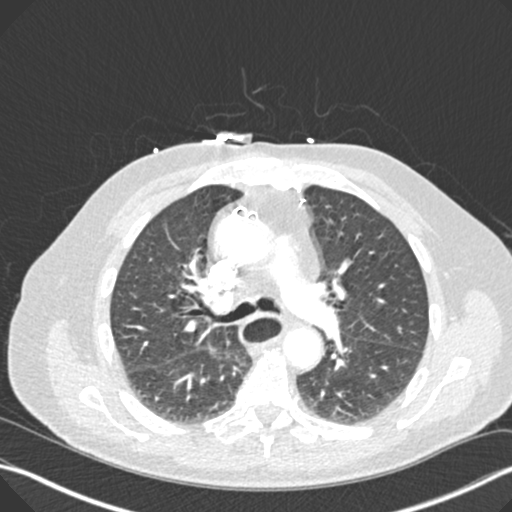
[im 171/263  soft-tissue]
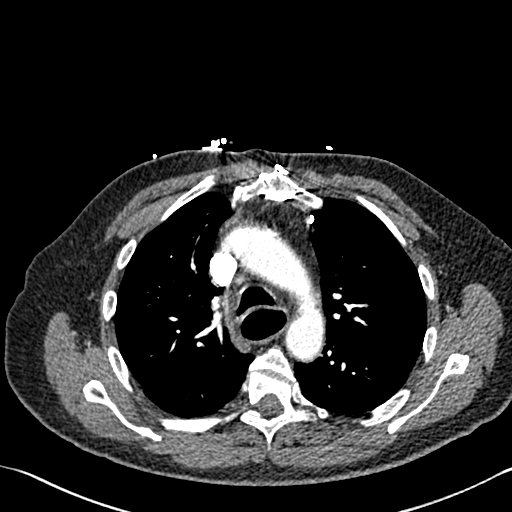
[im 194/263  lung]
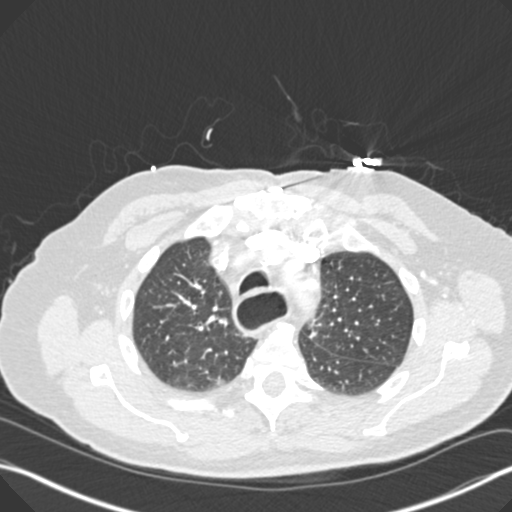
[im 206/263  soft-tissue]
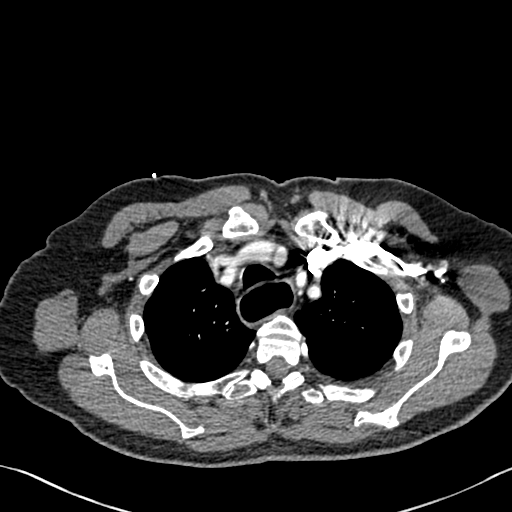
[im 217/263  lung]
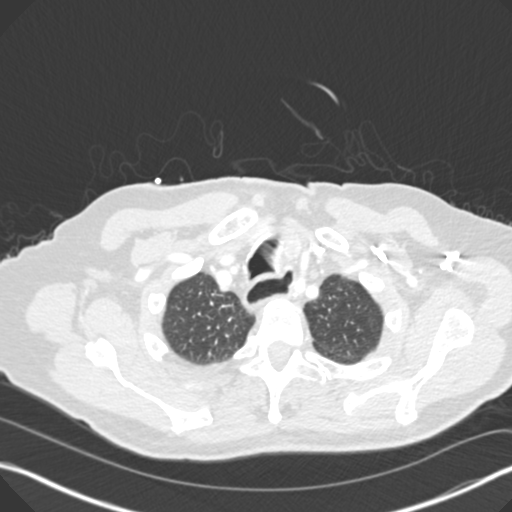
[im 240/263  soft-tissue]
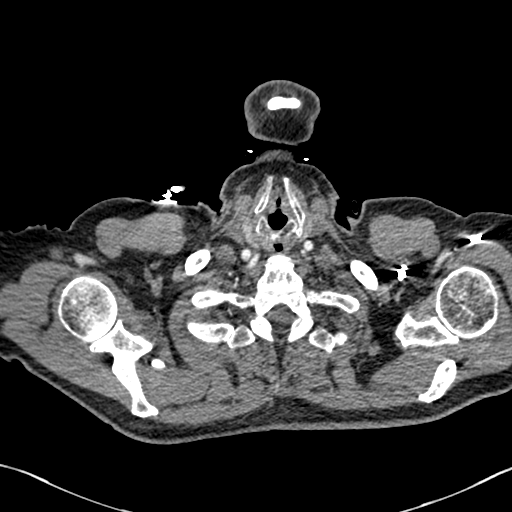
[im 251/263  lung]
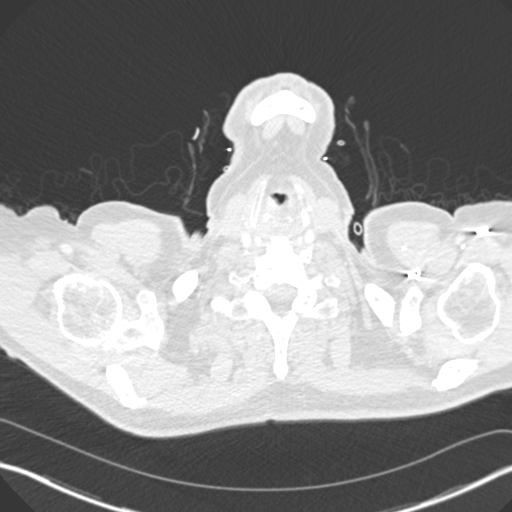

[Series 7: coronal mpr · coronal · 0.58mm/px · 2 of 96 slices shown]
[im 32/96  soft-tissue]
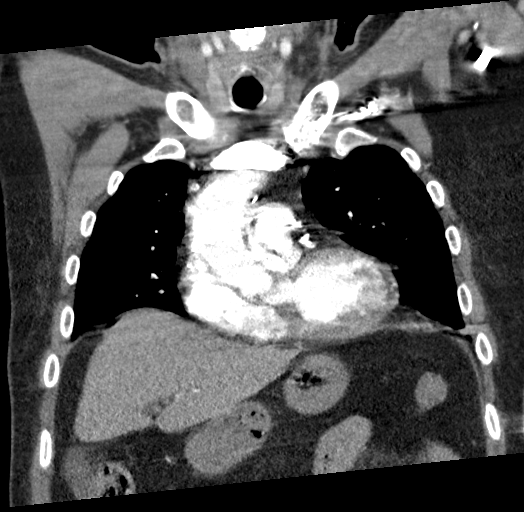
[im 64/96  soft-tissue]
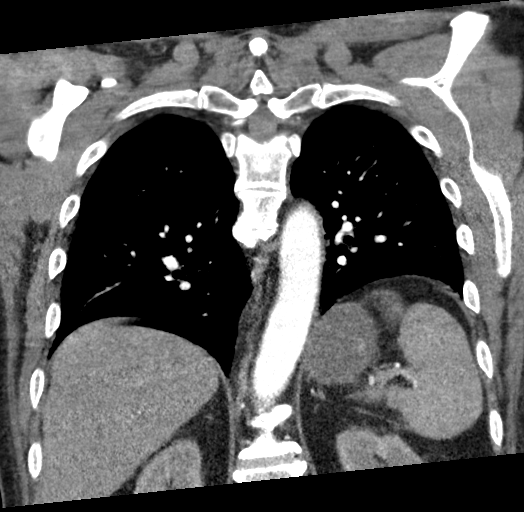

[19 of 46 positions shown; findings below may reference images not displayed]

FINDINGS: Cardiovascular: No filling defects in the pulmonary arteries to
suggest pulmonary emboli. Heart is normal size. Prior CABG. Aortic
atherosclerosis. No aneurysm.

Mediastinum/Nodes: No mediastinal, hilar, or axillary adenopathy.
Small hiatal hernia. Esophagus mildly distended with gas and fluid
may be related to reflux. Distal esophageal wall appears mildly
thickened, possibly related to reflux esophagitis.

Lungs/Pleura: Lungs are clear. No focal airspace opacities or
suspicious nodules. No effusions.

Upper Abdomen: Gallstone noted with fluid in the gallbladder,
stable. No acute findings.

Musculoskeletal: Chest wall soft tissues are unremarkable. No acute
bony abnormality.

Review of the MIP images confirms the above findings.
IMPRESSION: No evidence of pulmonary embolus.

Prior CABG.

Small hiatal hernia. Distal esophageal wall thickening and mild
distention of the esophagus with air and fluid. Findings may be
related to reflux.

Aortic Atherosclerosis ([id number]).

## 2021-06-10 ENCOUNTER — Other Ambulatory Visit: Payer: Self-pay | Admitting: Family Medicine

## 2021-06-11 ENCOUNTER — Other Ambulatory Visit: Payer: Self-pay | Admitting: Family Medicine

## 2021-06-11 DIAGNOSIS — N400 Enlarged prostate without lower urinary tract symptoms: Secondary | ICD-10-CM

## 2021-06-19 ENCOUNTER — Encounter: Payer: Self-pay | Admitting: Family Medicine

## 2021-06-19 ENCOUNTER — Telehealth: Payer: Self-pay

## 2021-06-19 DIAGNOSIS — Z515 Encounter for palliative care: Secondary | ICD-10-CM | POA: Diagnosis not present

## 2021-06-19 DIAGNOSIS — C9140 Hairy cell leukemia not having achieved remission: Secondary | ICD-10-CM | POA: Diagnosis not present

## 2021-06-19 DIAGNOSIS — F03911 Unspecified dementia, unspecified severity, with agitation: Secondary | ICD-10-CM | POA: Diagnosis not present

## 2021-06-19 DIAGNOSIS — F05 Delirium due to known physiological condition: Secondary | ICD-10-CM | POA: Diagnosis not present

## 2021-06-19 DIAGNOSIS — Z9221 Personal history of antineoplastic chemotherapy: Secondary | ICD-10-CM | POA: Diagnosis not present

## 2021-06-19 DIAGNOSIS — I25118 Atherosclerotic heart disease of native coronary artery with other forms of angina pectoris: Secondary | ICD-10-CM | POA: Diagnosis not present

## 2021-06-19 DIAGNOSIS — F0392 Unspecified dementia, unspecified severity, with psychotic disturbance: Secondary | ICD-10-CM | POA: Diagnosis not present

## 2021-06-19 DIAGNOSIS — Z7982 Long term (current) use of aspirin: Secondary | ICD-10-CM | POA: Diagnosis not present

## 2021-06-19 DIAGNOSIS — Z951 Presence of aortocoronary bypass graft: Secondary | ICD-10-CM | POA: Diagnosis not present

## 2021-06-19 NOTE — Telephone Encounter (Signed)
  Would you have Frank Mcdaniel call [name] (son) at [phone] at her convivence.  Need to discuss and eventually talk to Dr Caryl Bis about something to help with the dementia sundowning.  Ormand Senn,cma

## 2021-06-19 NOTE — Telephone Encounter (Signed)
Good Morning   Would you have Dennissa call [name] (son) at [phone] at her convivence.  Need to discuss and eventually talk to Dr Caryl Bis about something to help with the dementia sundowning

## 2021-06-20 ENCOUNTER — Telehealth: Payer: Self-pay

## 2021-06-20 NOTE — Telephone Encounter (Signed)
Can we get him set up for an appointment to discuss this  with me or another provider if necessary?

## 2021-06-20 NOTE — Telephone Encounter (Signed)
Patient is scheduled to be seen this Friday.  Mikaela Hilgeman,cma

## 2021-06-22 ENCOUNTER — Ambulatory Visit (INDEPENDENT_AMBULATORY_CARE_PROVIDER_SITE_OTHER): Payer: PPO | Admitting: Family Medicine

## 2021-06-22 ENCOUNTER — Other Ambulatory Visit: Payer: Self-pay

## 2021-06-22 ENCOUNTER — Encounter: Payer: Self-pay | Admitting: Family Medicine

## 2021-06-22 DIAGNOSIS — F419 Anxiety disorder, unspecified: Secondary | ICD-10-CM | POA: Diagnosis not present

## 2021-06-22 DIAGNOSIS — E785 Hyperlipidemia, unspecified: Secondary | ICD-10-CM | POA: Diagnosis not present

## 2021-06-22 DIAGNOSIS — F0390 Unspecified dementia without behavioral disturbance: Secondary | ICD-10-CM

## 2021-06-22 MED ORDER — SERTRALINE HCL 50 MG PO TABS: ORAL_TABLET | ORAL | 1 refills | Status: DC

## 2021-06-22 NOTE — Telephone Encounter (Signed)
Called and spoke with Olivia Mackie. She noted they discussed zyprexa as that is what they typically do for memory care patients, though she wanted to leave this up to me. Will plan on discussing with the patient and his son today.

## 2021-06-22 NOTE — Assessment & Plan Note (Signed)
Previously well controlled.  He will continue Lipitor 20 mg once daily.

## 2021-06-22 NOTE — Assessment & Plan Note (Addendum)
This is a chronic issue.  He is noted to have some possible sundowning symptoms versus anxiety.  Discussed that the symptoms may be driven by mild underlying anxiety.  The nurse practitioner from [place] discussed Zyprexa though I do not typically prescribe this type of medication for this issue is there is an increased risk of death in dementia patients with this medication.  Discussed that it may be best to treat the mild underlying anxiety and see if that improves his symptoms.  Discussed it may be several weeks before they notice a difference.  We will send in Zoloft for the patient to take.  If they notice no difference after a few weeks they will let me know.

## 2021-06-22 NOTE — Assessment & Plan Note (Addendum)
Mild in nature.  We will treat with Zoloft 25 mg once daily for 14 days and then increase to 50 mg once daily.

## 2021-06-22 NOTE — Progress Notes (Signed)
Virtual Visit via telephone Note  This visit type was conducted due to national recommendations for restrictions regarding the COVID-19 pandemic (e.g. social distancing).  This format is felt to be most appropriate for this patient at this time.  All issues noted in this document were discussed and addressed.  No physical exam was performed (except for noted visual exam findings with Video Visits).   I connected with Frank Mcdaniel today at 10:00 AM EST by telephone and verified that I am speaking with the correct person using two identifiers. Location patient: home Location provider: work Persons participating in the virtual visit: patient, provider, Baldemar Dady (son)  I discussed the limitations, risks, security and privacy concerns of performing an evaluation and management service by telephone and the availability of in person appointments. I also discussed with the patient that there may be a patient responsible charge related to this service. The patient expressed understanding and agreed to proceed.  Interactive audio and video telecommunications were attempted between this provider and patient, however failed, due to patient having technical difficulties OR patient did not have access to video capability.  We continued and completed visit with audio only.   Reason for visit: Follow-up.  HPI: Dementia: Patient's son notes no increase in confusion or difference in his memory though does note that later in the day and into the evening he seems to become a little more agitated and "pestering.".  Notes this has been going on since the time changed.  Notes no fevers.  They do note some mild anxiety.  No depression.  [facility] did a urinalysis which did not reveal any signs of UTI.  Hyperlipidemia: Taking Lipitor.  No chest pain, claudication, right or quadrant pain, or myalgias.  ROS: See pertinent positives and negatives per HPI.  Past Medical History:  Diagnosis Date   Anemia  09/01/2015   Anxiety 09/11/2015   Arthritis    Asthma    as a teenager   Asymptomatic Sinus Bradycardia    Atypical chest pain    a. 08/2014   BPH (benign prostatic hyperplasia)    CKD (chronic kidney disease), stage III (HCC)    Closed fracture of tuft of distal phalanx of finger 08/31/2018   Community acquired pneumonia 08/10/2020   Coronary artery disease    a. 2000 s/p CABG;  b. 10/2008 Neg MV, EF 64%; c. cath 05/2015: LM 85% sev cal, pLAD-1 lesion 80%, pLAD-2 lesion 100% chronic, ostLCx 70% sev cal, OM4 90%, ostRCA 70%, mid RCA 90%, LIMA-LAD patent, VG-OM2 patent, VG-OM3 patent, VG-RPDA patent. No AS   Dementia without behavioral disturbance ([place]) 05/28/2019   GERD (gastroesophageal reflux disease)    H/O echocardiogram    a. 10/2005 Echo: nl EF.   Hairy cell leukemia (HCC)    HOH (hard of hearing)    Bilateral hearing aids   HTN (hypertension)    Hyperlipidemia    Hypertension    Leukemia ([place])    Neutropenic fever ([place]) 11/08/2019   Status post inguinal hernia repair 09/13/2016   Stricture and stenosis of esophagus     Past Surgical History:  Procedure Laterality Date   CARDIAC CATHETERIZATION N/A 05/26/2015   Procedure: Left Heart Cath;  Surgeon: [name], MD;  Location: [facility];  Service: Cardiovascular;  Laterality: N/A;   CARDIAC CATHETERIZATION N/A 05/26/2015   Procedure: Coronary/Graft Angiography;  Surgeon: [name], MD;  Location: [facility];  Service: Cardiovascular;  Laterality: N/A;   CATARACT EXTRACTION  CORONARY ARTERY BYPASS GRAFT  2000   ESOPHAGOGASTRODUODENOSCOPY N/A 02/18/2017   Procedure: ESOPHAGOGASTRODUODENOSCOPY (EGD) with removal of food bolus;  Surgeon: [name], MD;  Location: [facility];  Service: Endoscopy;  Laterality: N/A;   ESOPHAGOGASTRODUODENOSCOPY (EGD) WITH PROPOFOL N/A 03/11/2017   Procedure: ESOPHAGOGASTRODUODENOSCOPY (EGD) WITH PROPOFOL;  Surgeon: [name], MD;  Location: [facility] ENDOSCOPY;   Service: Endoscopy;  Laterality: N/A;   ESOPHAGOGASTRODUODENOSCOPY (EGD) WITH PROPOFOL N/A 04/15/2017   Procedure: ESOPHAGOGASTRODUODENOSCOPY (EGD) WITH PROPOFOL;  Surgeon: [name], MD;  Location: [facility] ENDOSCOPY;  Service: Endoscopy;  Laterality: N/A;   ESOPHAGOGASTRODUODENOSCOPY (EGD) WITH PROPOFOL N/A 09/12/2020   Procedure: ESOPHAGOGASTRODUODENOSCOPY (EGD) WITH PROPOFOL;  Surgeon: Lin Landsman, MD;  Location: [facility] ENDOSCOPY;  Service: Gastroenterology;  Laterality: N/A;   EYE SURGERY Left    Cataract Extraction with IOL   INGUINAL HERNIA REPAIR Left 06/24/2016   Procedure: HERNIA REPAIR INGUINAL ADULT;  Surgeon: [name], MD;  Location: [facility] ORS;  Service: General;  Laterality: Left;   TONSILLECTOMY  1942    UPPER GASTROINTESTINAL ENDOSCOPY  3 years ago    with Dilation.     Family History  Problem Relation Age of Onset   Hypertension Mother    Arthritis Father    Other Other        no premature CAD.   Colon cancer Neg Hx    Prostate cancer Neg Hx    Stomach cancer Neg Hx     SOCIAL HX: Non-smoker   Current Outpatient Medications:    aspirin EC 81 MG tablet, Take 1 tablet (81 mg total) by mouth daily. Swallow whole., Disp: 90 tablet, Rfl: 3   atorvastatin (LIPITOR) 40 MG tablet, TAKE 1/2 TABLETS (20 MG TOTAL) BY MOUTH DAILY., Disp: 45 tablet, Rfl: 3   Calcium Carbonate Antacid (TUMS CHEWY BITES PO), Take 1 tablet by mouth as needed. , Disp: , Rfl:    famotidine (PEPCID) 20 MG tablet, TAKE 1 TABLET BY MOUTH EVERY DAY, Disp: 90 tablet, Rfl: 3   finasteride (PROSCAR) 5 MG tablet, TAKE 1 TABLET BY MOUTH EVERY DAY, Disp: 90 tablet, Rfl: 3   isosorbide mononitrate (IMDUR) 30 MG 24 hr tablet, Take 1 tablet (30 mg total) by mouth daily., Disp: 90 tablet, Rfl: 3   memantine (NAMENDA) 5 MG tablet, TAKE 1 TABLET BY MOUTH TWICE A DAY, Disp: 180 tablet, Rfl: 1   Multiple Vitamin (MULTIVITAMIN) tablet, Take 1 tablet by mouth daily., Disp: , Rfl:    pantoprazole (PROTONIX)  40 MG tablet, TAKE 1 TABLET BY MOUTH EVERY DAY, Disp: 90 tablet, Rfl: 1   sertraline (ZOLOFT) 50 MG tablet, Take 0.5 tablets (25 mg total) by mouth daily for 14 days, THEN 1 tablet (50 mg total) daily., Disp: 90 tablet, Rfl: 1   tamsulosin (FLOMAX) 0.4 MG CAPS capsule, TAKE 2 CAPSULES BY MOUTH EVERY DAY, Disp: 180 capsule, Rfl: 1  EXAM: This was a telephone visit and thus no exam was completed.  ASSESSMENT AND PLAN:  Discussed the following assessment and plan:  Problem List Items Addressed This Visit     Hyperlipidemia (Chronic)    Previously well controlled.  He will continue Lipitor 20 mg once daily.      Anxiety    Mild in nature.  We will treat with Zoloft 25 mg once daily for 14 days and then increase to 50 mg once daily.      Relevant Medications   sertraline (ZOLOFT) 50 MG tablet   Dementia without behavioral disturbance ([place])  This is a chronic issue.  He is noted to have some possible sundowning symptoms versus anxiety.  Discussed that the symptoms may be driven by mild underlying anxiety.  The nurse practitioner from [place] discussed Zyprexa though I do not typically prescribe this type of medication for this issue is there is an increased risk of death in dementia patients with this medication.  Discussed that it may be best to treat the mild underlying anxiety and see if that improves his symptoms.  Discussed it may be several weeks before they notice a difference.  We will send in Zoloft for the patient to take.  If they notice no difference after a few weeks they will let me know.      Relevant Medications   sertraline (ZOLOFT) 50 MG tablet   The patient's son did report that the patient's sitter had been exposed to COVID-19 through her son.  The sitter has not had any symptoms.  The patient has not been around the sitter since 06/18/2021.  The patient has not developed symptoms.  Advised to monitor for symptoms and if he develops symptoms they should let us  know.  Return in about 3 months (around 09/22/2021).   I discussed the assessment and treatment plan with the patient. The patient was provided an opportunity to ask questions and all were answered. The patient agreed with the plan and demonstrated an understanding of the instructions.   The patient was advised to call back or seek an in-person evaluation if the symptoms worsen or if the condition fails to improve as anticipated.  I provided 14 minutes of non-face-to-face time during this encounter.   [name], MD

## 2021-07-03 DIAGNOSIS — F03911 Unspecified dementia, unspecified severity, with agitation: Secondary | ICD-10-CM | POA: Diagnosis not present

## 2021-07-03 DIAGNOSIS — F03918 Unspecified dementia, unspecified severity, with other behavioral disturbance: Secondary | ICD-10-CM | POA: Diagnosis not present

## 2021-07-03 DIAGNOSIS — F05 Delirium due to known physiological condition: Secondary | ICD-10-CM | POA: Diagnosis not present

## 2021-07-13 DIAGNOSIS — I739 Peripheral vascular disease, unspecified: Secondary | ICD-10-CM | POA: Diagnosis not present

## 2021-07-13 DIAGNOSIS — F05 Delirium due to known physiological condition: Secondary | ICD-10-CM | POA: Diagnosis not present

## 2021-07-13 DIAGNOSIS — F03911 Unspecified dementia, unspecified severity, with agitation: Secondary | ICD-10-CM | POA: Diagnosis not present

## 2021-07-13 DIAGNOSIS — Z7982 Long term (current) use of aspirin: Secondary | ICD-10-CM | POA: Diagnosis not present

## 2021-07-13 DIAGNOSIS — Z66 Do not resuscitate: Secondary | ICD-10-CM | POA: Diagnosis not present

## 2021-07-13 DIAGNOSIS — Z515 Encounter for palliative care: Secondary | ICD-10-CM | POA: Diagnosis not present

## 2021-07-19 ENCOUNTER — Encounter: Payer: Self-pay | Admitting: Family Medicine

## 2021-07-19 ENCOUNTER — Other Ambulatory Visit: Payer: Self-pay

## 2021-07-19 ENCOUNTER — Inpatient Hospital Stay: Payer: PPO | Attending: Oncology

## 2021-07-19 DIAGNOSIS — C9142 Hairy cell leukemia, in relapse: Secondary | ICD-10-CM

## 2021-07-19 DIAGNOSIS — D649 Anemia, unspecified: Secondary | ICD-10-CM | POA: Insufficient documentation

## 2021-07-19 DIAGNOSIS — C9140 Hairy cell leukemia not having achieved remission: Secondary | ICD-10-CM | POA: Insufficient documentation

## 2021-07-19 LAB — BASIC METABOLIC PANEL
Anion gap: 7 (ref 5–15)
BUN: 22 mg/dL (ref 8–23)
CO2: 25 mmol/L (ref 22–32)
Calcium: 9.2 mg/dL (ref 8.9–10.3)
Chloride: 108 mmol/L (ref 98–111)
Creatinine, Ser: 1.48 mg/dL — ABNORMAL HIGH (ref 0.61–1.24)
GFR, Estimated: 46 mL/min — ABNORMAL LOW (ref >60)
Glucose, Bld: 105 mg/dL — ABNORMAL HIGH (ref 70–99)
Potassium: 4.3 mmol/L (ref 3.5–5.1)
Sodium: 140 mmol/L (ref 135–145)

## 2021-07-19 LAB — CBC WITH DIFFERENTIAL/PLATELET
Abs Immature Granulocytes: 0.09 10*3/uL — ABNORMAL HIGH (ref 0.00–0.07)
Basophils Absolute: 0 10*3/uL (ref 0.0–0.1)
Basophils Relative: 0 %
Eosinophils Absolute: 0.1 10*3/uL (ref 0.0–0.5)
Eosinophils Relative: 2 %
HCT: 35.3 % — ABNORMAL LOW (ref 39.0–52.0)
Hemoglobin: 11.7 g/dL — ABNORMAL LOW (ref 13.0–17.0)
Immature Granulocytes: 2 %
Lymphocytes Relative: 7 %
Lymphs Abs: 0.4 10*3/uL — ABNORMAL LOW (ref 0.7–4.0)
MCH: 33.2 pg (ref 26.0–34.0)
MCHC: 33.1 g/dL (ref 30.0–36.0)
MCV: 100.3 fL — ABNORMAL HIGH (ref 80.0–100.0)
Monocytes Absolute: 0.4 10*3/uL (ref 0.1–1.0)
Monocytes Relative: 7 %
Neutro Abs: 4.1 10*3/uL (ref 1.7–7.7)
Neutrophils Relative %: 82 %
Platelets: 164 10*3/uL (ref 150–400)
RBC: 3.52 MIL/uL — ABNORMAL LOW (ref 4.22–5.81)
RDW: 12.8 % (ref 11.5–15.5)
WBC: 5.1 10*3/uL (ref 4.0–10.5)
nRBC: 0 % (ref 0.0–0.2)

## 2021-07-23 ENCOUNTER — Ambulatory Visit (INDEPENDENT_AMBULATORY_CARE_PROVIDER_SITE_OTHER): Payer: PPO

## 2021-07-23 VITALS — Ht 69.0 in | Wt 165.0 lb

## 2021-07-23 DIAGNOSIS — Z0000 Encounter for general adult medical examination without abnormal findings: Secondary | ICD-10-CM | POA: Diagnosis not present

## 2021-07-23 NOTE — Patient Instructions (Addendum)
Mr. Frank Mcdaniel , Thank you for taking time to come for your Medicare Wellness Visit. I appreciate your ongoing commitment to your health goals. Please review the following plan we discussed and let me know if I can assist you in the future.   These are the goals we discussed:  Goals      Follow up with Primary Care Provider     As needed and maintain good health        This is a list of the screening recommended for you and due dates:  Health Maintenance  Topic Date Due   Tetanus Vaccine  11/04/2027   Pneumonia Vaccine  Completed   Flu Shot  Completed   COVID-19 Vaccine  Completed   Zoster (Shingles) Vaccine  Completed   HPV Vaccine  Aged Out    Advanced directives: on file  Conditions/risks identified: none new  Next appointment: Follow up in one year for your annual wellness visit.   Preventive Care 6 Years and Older, Male Preventive care refers to lifestyle choices and visits with your health care provider that can promote health and wellness. What does preventive care include? A yearly physical exam. This is also called an annual well check. Dental exams once or twice a year. Routine eye exams. Ask your health care provider how often you should have your eyes checked. Personal lifestyle choices, including: Daily care of your teeth and gums. Regular physical activity. Eating a healthy diet. Avoiding tobacco and drug use. Limiting alcohol use. Practicing safe sex. Taking low doses of aspirin every day. Taking vitamin and mineral supplements as recommended by your health care provider. What happens during an annual well check? The services and screenings done by your health care provider during your annual well check will depend on your age, overall health, lifestyle risk factors, and family history of disease. Counseling  Your health care provider may ask you questions about your: Alcohol use. Tobacco use. Drug use. Emotional well-being. Home and relationship  well-being. Sexual activity. Eating habits. History of falls. Memory and ability to understand (cognition). Work and work Statistician. Screening  You may have the following tests or measurements: Height, weight, and BMI. Blood pressure. Lipid and cholesterol levels. These may be checked every 5 years, or more frequently if you are over 19 years old. Skin check. Lung cancer screening. You may have this screening every year starting at age 98 if you have a 30-pack-year history of smoking and currently smoke or have quit within the past 15 years. Fecal occult blood test (FOBT) of the stool. You may have this test every year starting at age 33. Flexible sigmoidoscopy or colonoscopy. You may have a sigmoidoscopy every 5 years or a colonoscopy every 10 years starting at age 86. Prostate cancer screening. Recommendations will vary depending on your family history and other risks. Hepatitis C blood test. Hepatitis B blood test. Sexually transmitted disease (STD) testing. Diabetes screening. This is done by checking your blood sugar (glucose) after you have not eaten for a while (fasting). You may have this done every 1-3 years. Abdominal aortic aneurysm (AAA) screening. You may need this if you are a current or former smoker. Osteoporosis. You may be screened starting at age 3 if you are at high risk. Talk with your health care provider about your test results, treatment options, and if necessary, the need for more tests. Vaccines  Your health care provider may recommend certain vaccines, such as: Influenza vaccine. This is recommended every year. Tetanus, diphtheria,  and acellular pertussis (Tdap, Td) vaccine. You may need a Td booster every 10 years. Zoster vaccine. You may need this after age 61. Pneumococcal 13-valent conjugate (PCV13) vaccine. One dose is recommended after age 38. Pneumococcal polysaccharide (PPSV23) vaccine. One dose is recommended after age 7. Talk to your health care  provider about which screenings and vaccines you need and how often you need them. This information is not intended to replace advice given to you by your health care provider. Make sure you discuss any questions you have with your health care provider. Document Released: 08/18/2015 Document Revised: 04/10/2016 Document Reviewed: 05/23/2015 Elsevier Interactive Patient Education  2017 [employer] Prevention in the Home Falls can cause injuries. They can happen to people of all ages. There are many things you can do to make your home safe and to help prevent falls. What can I do on the outside of my home? Regularly fix the edges of walkways and driveways and fix any cracks. Remove anything that might make you trip as you walk through a door, such as a raised step or threshold. Trim any bushes or trees on the path to your home. Use bright outdoor lighting. Clear any walking paths of anything that might make someone trip, such as rocks or tools. Regularly check to see if handrails are loose or broken. Make sure that both sides of any steps have handrails. Any raised decks and porches should have guardrails on the edges. Have any leaves, snow, or ice cleared regularly. Use sand or salt on walking paths during winter. Clean up any spills in your garage right away. This includes oil or grease spills. What can I do in the bathroom? Use night lights. Install grab bars by the toilet and in the tub and shower. Do not use towel bars as grab bars. Use non-skid mats or decals in the tub or shower. If you need to sit down in the shower, use a plastic, non-slip stool. Keep the floor dry. Clean up any water that spills on the floor as soon as it happens. Remove soap buildup in the tub or shower regularly. Attach bath mats securely with double-sided non-slip rug tape. Do not have throw rugs and other things on the floor that can make you trip. What can I do in the bedroom? Use night lights. Make  sure that you have a light by your bed that is easy to reach. Do not use any sheets or blankets that are too big for your bed. They should not hang down onto the floor. Have a firm chair that has side arms. You can use this for support while you get dressed. Do not have throw rugs and other things on the floor that can make you trip. What can I do in the kitchen? Clean up any spills right away. Avoid walking on wet floors. Keep items that you use a lot in easy-to-reach places. If you need to reach something above you, use a strong step stool that has a grab bar. Keep electrical cords out of the way. Do not use floor polish or wax that makes floors slippery. If you must use wax, use non-skid floor wax. Do not have throw rugs and other things on the floor that can make you trip. What can I do with my stairs? Do not leave any items on the stairs. Make sure that there are handrails on both sides of the stairs and use them. Fix handrails that are broken or loose. Make sure  that handrails are as long as the stairways. Check any carpeting to make sure that it is firmly attached to the stairs. Fix any carpet that is loose or worn. Avoid having throw rugs at the top or bottom of the stairs. If you do have throw rugs, attach them to the floor with carpet tape. Make sure that you have a light switch at the top of the stairs and the bottom of the stairs. If you do not have them, ask someone to add them for you. What else can I do to help prevent falls? Wear shoes that: Do not have high heels. Have rubber bottoms. Are comfortable and fit you well. Are closed at the toe. Do not wear sandals. If you use a stepladder: Make sure that it is fully opened. Do not climb a closed stepladder. Make sure that both sides of the stepladder are locked into place. Ask someone to hold it for you, if possible. Clearly mark and make sure that you can see: Any grab bars or handrails. First and last steps. Where the  edge of each step is. Use tools that help you move around (mobility aids) if they are needed. These include: Canes. Walkers. Scooters. Crutches. Turn on the lights when you go into a dark area. Replace any light bulbs as soon as they burn out. Set up your furniture so you have a clear path. Avoid moving your furniture around. If any of your floors are uneven, fix them. If there are any pets around you, be aware of where they are. Review your medicines with your doctor. Some medicines can make you feel dizzy. This can increase your chance of falling. Ask your doctor what other things that you can do to help prevent falls. This information is not intended to replace advice given to you by your health care provider. Make sure you discuss any questions you have with your health care provider. Document Released: 05/18/2009 Document Revised: 12/28/2015 Document Reviewed: 08/26/2014 Elsevier Interactive Patient Education  2017 [employer].

## 2021-07-23 NOTE — Progress Notes (Signed)
Subjective:   Frank Mcdaniel is a 87 y.o. male who presents for Medicare Annual/Subsequent preventive examination.  Review of Systems    No ROS.  Medicare Wellness Virtual Visit.  Visual/audio telehealth visit, UTA vital signs.   See social history for additional risk factors.   Cardiac Risk Factors include: advanced age (>32men, >86 women);male gender     Objective:    Today's Vitals   07/23/21 0903  Weight: 165 lb (74.8 kg)  Height: 5\' 9"  (1.753 m)   Body mass index is 24.37 kg/m.  Advanced Directives 07/23/2021 01/09/2021 11/13/2020 11/13/2020 10/03/2020 09/24/2020 09/24/2020  Does Patient Have a Medical Advance Directive? Yes Yes Yes Yes Yes Yes Yes  Type of Paramedic of [place];Living will - - - Healthcare Power of [place]  Does patient want to make changes to medical advance directive? No - Patient declined - No - Patient declined - - No - Patient declined -  Copy of [place] in Chart? Yes - validated most recent copy scanned in chart (See row information) - - - - Yes - validated most recent copy scanned in chart (See row information) -  Would patient like information on creating a medical advance directive? - - - - - - -    Current Medications (verified) Outpatient Encounter Medications as of 07/23/2021  Medication Sig   aspirin EC 81 MG tablet Take 1 tablet (81 mg total) by mouth daily. Swallow whole.   atorvastatin (LIPITOR) 40 MG tablet TAKE 1/2 TABLETS (20 MG TOTAL) BY MOUTH DAILY.   Calcium Carbonate Antacid (TUMS CHEWY BITES PO) Take 1 tablet by mouth as needed.    famotidine (PEPCID) 20 MG tablet TAKE 1 TABLET BY MOUTH EVERY DAY   finasteride (PROSCAR) 5 MG tablet TAKE 1 TABLET BY MOUTH EVERY DAY   isosorbide mononitrate (IMDUR) 30 MG 24 hr tablet Take 1 tablet (30 mg total) by mouth daily.   memantine (NAMENDA) 5 MG tablet TAKE 1 TABLET BY MOUTH TWICE A DAY    Multiple Vitamin (MULTIVITAMIN) tablet Take 1 tablet by mouth daily.   pantoprazole (PROTONIX) 40 MG tablet TAKE 1 TABLET BY MOUTH EVERY DAY   sertraline (ZOLOFT) 50 MG tablet Take 0.5 tablets (25 mg total) by mouth daily for 14 days, THEN 1 tablet (50 mg total) daily.   tamsulosin (FLOMAX) 0.4 MG CAPS capsule TAKE 2 CAPSULES BY MOUTH EVERY DAY   No facility-administered encounter medications on file as of 07/23/2021.    Allergies (verified) Patient has no known allergies.   History: Past Medical History:  Diagnosis Date   Anemia 09/01/2015   Anxiety 09/11/2015   Arthritis    Asthma    as a teenager   Asymptomatic Sinus Bradycardia    Atypical chest pain    a. 08/2014   BPH (benign prostatic hyperplasia)    CKD (chronic kidney disease), stage III (HCC)    Closed fracture of tuft of distal phalanx of finger 08/31/2018   Community acquired pneumonia 08/10/2020   Coronary artery disease    a. 2000 s/p CABG;  b. 10/2008 Neg MV, EF 64%; c. cath 05/2015: LM 85% sev cal, pLAD-1 lesion 80%, pLAD-2 lesion 100% chronic, ostLCx 70% sev cal, OM4 90%, ostRCA 70%, mid RCA 90%, LIMA-LAD patent, VG-OM2 patent, VG-OM3 patent, VG-RPDA patent. No AS   Dementia without behavioral disturbance ([place]) 05/28/2019   GERD (gastroesophageal reflux disease)    H/O echocardiogram  a. 10/2005 Echo: nl EF.   Hairy cell leukemia (HCC)    HOH (hard of hearing)    Bilateral hearing aids   HTN (hypertension)    Hyperlipidemia    Hypertension    Leukemia ([place])    Neutropenic fever ([place]) 11/08/2019   Status post inguinal hernia repair 09/13/2016   Stricture and stenosis of esophagus    Past Surgical History:  Procedure Laterality Date   CARDIAC CATHETERIZATION N/A 05/26/2015   Procedure: Left Heart Cath;  Surgeon: [name], MD;  Location: [facility];  Service: Cardiovascular;  Laterality: N/A;   CARDIAC CATHETERIZATION N/A 05/26/2015   Procedure: Coronary/Graft Angiography;  Surgeon: [name], MD;  Location: [facility];  Service: Cardiovascular;  Laterality: N/A;   CATARACT EXTRACTION     CORONARY ARTERY BYPASS GRAFT  2000   ESOPHAGOGASTRODUODENOSCOPY N/A 02/18/2017   Procedure: ESOPHAGOGASTRODUODENOSCOPY (EGD) with removal of food bolus;  Surgeon: [name], MD;  Location: [facility];  Service: Endoscopy;  Laterality: N/A;   ESOPHAGOGASTRODUODENOSCOPY (EGD) WITH PROPOFOL N/A 03/11/2017   Procedure: ESOPHAGOGASTRODUODENOSCOPY (EGD) WITH PROPOFOL;  Surgeon: [name], MD;  Location: [facility];  Service: Endoscopy;  Laterality: N/A;   ESOPHAGOGASTRODUODENOSCOPY (EGD) WITH PROPOFOL N/A 04/15/2017   Procedure: ESOPHAGOGASTRODUODENOSCOPY (EGD) WITH PROPOFOL;  Surgeon: [name], MD;  Location: [facility];  Service: Endoscopy;  Laterality: N/A;   ESOPHAGOGASTRODUODENOSCOPY (EGD) WITH PROPOFOL N/A 09/12/2020   Procedure: ESOPHAGOGASTRODUODENOSCOPY (EGD) WITH PROPOFOL;  Surgeon: Lin Landsman, MD;  Location: [facility] ENDOSCOPY;  Service: Gastroenterology;  Laterality: N/A;   EYE SURGERY Left    Cataract Extraction with IOL   INGUINAL HERNIA REPAIR Left 06/24/2016   Procedure: HERNIA REPAIR INGUINAL ADULT;  Surgeon: [name], MD;  Location: [facility] ORS;  Service: General;  Laterality: Left;   TONSILLECTOMY  1942    UPPER GASTROINTESTINAL ENDOSCOPY  3 years ago    with Dilation.    Family History  Problem Relation Age of Onset   Hypertension Mother    Arthritis Father    Other Other        no premature CAD.   Colon cancer Neg Hx    Prostate cancer Neg Hx    Stomach cancer Neg Hx    Social History   Socioeconomic History   Marital status: Widowed    Spouse name: Not on file   Number of children: 1   Years of education: Not on file   Highest education level: Not on file  Occupational History   Occupation: retired  Tobacco Use   Smoking status: Never   Smokeless tobacco: Never  Vaping Use   Vaping Use: Never used  Substance and  Sexual Activity   Alcohol use: No   Drug use: No   Sexual activity: Never  Other Topics Concern   Not on file  Social History Narrative   Lives locally with son.  Fairly active around the house though does not routinely exercise.  Retired from [employer].   Social Determinants of Health   Financial Resource Strain: Low Risk    Difficulty of Paying Living Expenses: Not hard at all  Food Insecurity: No Food Insecurity   Worried About Charity fundraiser in the Last Year: Never true   [place] in the Last Year: Never true  Transportation Needs: No Transportation Needs   Lack of Transportation (Medical): No   Lack of Transportation (Non-Medical): No  Physical Activity: Not on file  Stress: No  Stress Concern Present   Feeling of Stress : Not at all  Social Connections: Unknown   Frequency of Communication with Friends and Family: Not on file   Frequency of Social Gatherings with Friends and Family: More than three times a week   Attends Religious Services: Not on Electrical engineer or Organizations: Not on file   Attends Archivist Meetings: Not on file   Marital Status: Not on file    Tobacco Counseling Counseling given: Not Answered   Clinical Intake:  Pre-visit preparation completed: Yes        Diabetes: No  How often do you need to have someone help you when you read instructions, pamphlets, or other written materials from your doctor or pharmacy?: 4 - Often   Interpreter Needed?: No      Activities of Daily Living In your present state of health, do you have any difficulty performing the following activities: 07/23/2021 11/13/2020  Hearing? Tempie Donning  Vision? N N  Difficulty concentrating or making decisions? Tempie Donning  Walking or climbing stairs? N Y  Dressing or bathing? N Y  Doing errands, shopping? Y Y  Comment He does not drive Facilities manager and eating ? Y -  Comment Son manages food prep. Self feeds. -  Using the Toilet? N -  In  the past six months, have you accidently leaked urine? N -  Do you have problems with loss of bowel control? N -  Managing your Medications? Y -  Comment Son manages -  Managing your Finances? Y -  Comment Son manages -  Housekeeping or managing your Housekeeping? Y -  Comment Son manages -  Some recent data might be hidden    Patient Care Team: [name], MD as PCP - General (Family Medicine) Rockey Situ, Kathlene November, MD as PCP - Cardiology (Cardiology) [name], MD as Consulting Physician (Cardiology) [name], MD as Consulting Physician (Oncology) Nori Riis, PA-C as Physician Assistant (Urology)  Indicate any recent Medical Services you may have received from other than [facility] providers in the past year (date may be approximate).     Assessment:   This is a routine wellness examination for Frank Mcdaniel.  Virtual Visit via Telephone Note I connected with  Frank Mcdaniel on 07/23/21 at  9:00 AM EST by telephone and verified that I am speaking with the correct person using two identifiers.  Persons participating in the virtual visit: patient/son/Nurse Health Advisor   I discussed the limitations, risks, security and privacy concerns of performing an evaluation and management service by telephone and the availability of in person appointments. The patient expressed understanding and agreed to proceed.  Interactive audio and video telecommunications were attempted between this nurse and patient, however failed, due to patient having technical difficulties OR patient did not have access to video capability.  We continued and completed visit with audio only.  Some vital signs may be absent or patient reported.   Hearing/Vision screen Hearing Screening - Comments:: Followed by [facility]  Hearing aid, bilateral Vision Screening - Comments:: Cataract extraction, single  Wears corrective lenses  They have seen their ophthalmologist in the last 12  months.   Dietary issues and exercise activities discussed: Current Exercise Habits: Home exercise routine, Type of exercise: walking, Intensity: Mild Regular diet Good water/fluid intake   Goals Addressed             This Visit's Progress  Follow up with Primary Care Provider       As needed and maintain good health       Depression Screen PHQ 2/9 Scores 07/23/2021 04/17/2021 11/08/2020 05/30/2020 01/07/2020 08/02/2019 05/28/2019  PHQ - 2 Score 0 0 0 0 0 0 0  PHQ- 9 Score - - - - - - -    Fall Risk Fall Risk  07/23/2021 04/17/2021 11/08/2020 05/30/2020 12/31/2019  Falls in the past year? 0 1 0 0 0  Number falls in past yr: 0 0 0 0 -  Injury with Fall? - 0 - - -  Follow up Falls evaluation completed Falls evaluation completed Falls evaluation completed Falls evaluation completed Falls evaluation completed   [place]: Home free of loose throw rugs in walkways, pet beds, electrical cords, etc? Yes  Adequate lighting in your home to reduce risk of falls? Yes   ASSISTIVE DEVICES UTILIZED TO PREVENT FALLS: Life alert? No  Use of a cane, walker or w/c? No   TIMED UP AND GO: Was the test performed? No .   Cognitive Function: Followed by pcp for Dementia. Taking all medication as directed.  MMSE - Mini Mental State Exam 05/28/2019 04/23/2016  Orientation to time 3 5  Orientation to Place 5 5  Registration 3 3  Attention/ Calculation 0 5  Recall 1 3  Language- name 2 objects 2 2  Language- repeat 1 1  Language- follow 3 step command 1 3  Language- read & follow direction 1 1  Write a sentence 1 1  Copy design 1 1  Total score 19 30     6CIT Screen 05/06/2018 05/05/2017  What Year? 0 points 0 points  What month? 0 points 0 points  What time? 0 points 0 points  Count back from 20 0 points 0 points  Months in reverse 0 points 0 points  Repeat phrase 0 points 2 points  Total Score 0 2    Immunizations Immunization History   Administered Date(s) Administered   Fluad Quad(high Dose 65+) 05/04/2019, 04/17/2021   Influenza, High Dose Seasonal PF 05/12/2017, 04/23/2018   Influenza,inj,quad, With Preservative 05/06/2018   Influenza-Unspecified 04/05/2016, 05/12/2017, 05/02/2020   Moderna Covid-19 Vaccine Bivalent Booster 36yrs & up 06/05/2021   PFIZER Comirnaty(Gray Top)Covid-19 Tri-Sucrose Vaccine 03/06/2021   PFIZER(Purple Top)SARS-COV-2 Vaccination 08/19/2019, 09/09/2019, 05/11/2020   Pneumococcal Conjugate-13 11/03/2017   Pneumococcal Polysaccharide-23 05/28/2019   Tdap 11/03/2017   Zoster Recombinat (Shingrix) 05/11/2019, 08/04/2019   Screening Tests Health Maintenance  Topic Date Due   TETANUS/TDAP  11/04/2027   Pneumonia Vaccine 2+ Years old  Completed   INFLUENZA VACCINE  Completed   COVID-19 Vaccine  Completed   Zoster Vaccines- Shingrix  Completed   HPV VACCINES  Aged Out   Health Maintenance There are no preventive care reminders to display for this patient.  Lung Cancer Screening: (Low Dose CT Chest recommended if Age 30-80 years, 30 pack-year currently smoking OR have quit w/in 15years.) does not qualify.   Hepatitis C Screening: does not qualify  Vision Screening: Recommended annual ophthalmology exams for early detection of glaucoma and other disorders of the eye.  Dental Screening: Recommended annual dental exams for proper oral hygiene  Community Resource Referral / Chronic Care Management: CRR required this visit?  No   CCM required this visit?  No      Plan:   Keep all routine maintenance appointments.   I have personally reviewed and noted the following in the  patients chart:   Medical and social history Use of alcohol, tobacco or illicit drugs  Current medications and supplements including opioid prescriptions. Patient is not currently taking opioid prescriptions. Functional ability and status Nutritional status Physical activity Advanced directives List of other  physicians Hospitalizations, surgeries, and ER visits in previous 12 months Vitals Screenings to include cognitive, depression, and falls Referrals and appointments  In addition, I have reviewed and discussed with patient certain preventive protocols, quality metrics, and best practice recommendations. A written personalized care plan for preventive services as well as general preventive health recommendations were provided to patient.     Varney Biles, LPN   [id number]/[id number]

## 2021-07-24 NOTE — Progress Notes (Signed)
I have reviewed the above note and agree.  Gabreille Dardis, M.D.  

## 2021-08-02 ENCOUNTER — Telehealth: Payer: Self-pay | Admitting: Family Medicine

## 2021-08-02 ENCOUNTER — Other Ambulatory Visit: Payer: Self-pay | Admitting: Cardiovascular Disease

## 2021-08-02 DIAGNOSIS — J189 Pneumonia, unspecified organism: Secondary | ICD-10-CM

## 2021-08-02 DIAGNOSIS — J690 Pneumonitis due to inhalation of food and vomit: Secondary | ICD-10-CM | POA: Diagnosis not present

## 2021-08-02 NOTE — Telephone Encounter (Signed)
Patient was seen by [place] called patient was started on Augmentin, Mucinex, albuterol inhaler, 5 days of prednisone, for pneumonia.[facility] is requesting a order for an xray. Please call [phone], [place] .

## 2021-08-02 NOTE — Telephone Encounter (Signed)
I am unfamiliar with [facility]? Ok to order chest x ray however if patient is having worsening symptoms he should be seen immediately in person.  Follow treatment plan from office  if not improving or any worsening within 72 hours and also return to office or open medical facility at ANYTIME if any symptoms persist, change, or worsen or you have any further concerns or questions. Call 911 immediately for emergencies.

## 2021-08-03 ENCOUNTER — Telehealth: Payer: Self-pay | Admitting: Family Medicine

## 2021-08-03 ENCOUNTER — Ambulatory Visit
Admission: RE | Admit: 2021-08-03 | Discharge: 2021-08-03 | Disposition: A | Discharge status: Home / Self Care | Payer: PPO | Source: Ambulatory Visit | Attending: Family Medicine | Admitting: Family Medicine

## 2021-08-03 ENCOUNTER — Ambulatory Visit
Admission: RE | Admit: 2021-08-03 | Discharge: 2021-08-03 | Disposition: A | Discharge status: Home / Self Care | Payer: PPO | Source: Ambulatory Visit | Attending: Adult Health | Admitting: Adult Health

## 2021-08-03 DIAGNOSIS — J189 Pneumonia, unspecified organism: Secondary | ICD-10-CM | POA: Insufficient documentation

## 2021-08-03 DIAGNOSIS — Z951 Presence of aortocoronary bypass graft: Secondary | ICD-10-CM | POA: Diagnosis not present

## 2021-08-03 DIAGNOSIS — Z5321 Procedure and treatment not carried out due to patient leaving prior to being seen by health care provider: Secondary | ICD-10-CM | POA: Insufficient documentation

## 2021-08-03 DIAGNOSIS — U071 COVID-19: Secondary | ICD-10-CM | POA: Diagnosis not present

## 2021-08-03 DIAGNOSIS — J849 Interstitial pulmonary disease, unspecified: Secondary | ICD-10-CM | POA: Diagnosis not present

## 2021-08-03 DIAGNOSIS — R799 Abnormal finding of blood chemistry, unspecified: Secondary | ICD-10-CM | POA: Diagnosis present

## 2021-08-03 LAB — BASIC METABOLIC PANEL
Anion gap: 6 (ref 5–15)
BUN: 30 mg/dL — ABNORMAL HIGH (ref 8–23)
CO2: 25 mmol/L (ref 22–32)
Calcium: 9 mg/dL (ref 8.9–10.3)
Chloride: 107 mmol/L (ref 98–111)
Creatinine, Ser: 1.68 mg/dL — ABNORMAL HIGH (ref 0.61–1.24)
GFR, Estimated: 39 mL/min — ABNORMAL LOW (ref >60)
Glucose, Bld: 142 mg/dL — ABNORMAL HIGH (ref 70–99)
Potassium: 3.9 mmol/L (ref 3.5–5.1)
Sodium: 138 mmol/L (ref 135–145)

## 2021-08-03 LAB — CBC WITH DIFFERENTIAL/PLATELET
Abs Immature Granulocytes: 0.04 10*3/uL (ref 0.00–0.07)
Basophils Absolute: 0 10*3/uL (ref 0.0–0.1)
Basophils Relative: 0 %
Eosinophils Absolute: 0 10*3/uL (ref 0.0–0.5)
Eosinophils Relative: 0 %
HCT: 31.8 % — ABNORMAL LOW (ref 39.0–52.0)
Hemoglobin: 10.6 g/dL — ABNORMAL LOW (ref 13.0–17.0)
Immature Granulocytes: 1 %
Lymphocytes Relative: 4 %
Lymphs Abs: 0.2 10*3/uL — ABNORMAL LOW (ref 0.7–4.0)
MCH: 33.1 pg (ref 26.0–34.0)
MCHC: 33.3 g/dL (ref 30.0–36.0)
MCV: 99.4 fL (ref 80.0–100.0)
Monocytes Absolute: 0.2 10*3/uL (ref 0.1–1.0)
Monocytes Relative: 6 %
Neutro Abs: 3.8 10*3/uL (ref 1.7–7.7)
Neutrophils Relative %: 89 %
Platelets: 137 10*3/uL — ABNORMAL LOW (ref 150–400)
RBC: 3.2 MIL/uL — ABNORMAL LOW (ref 4.22–5.81)
RDW: 12.6 % (ref 11.5–15.5)
WBC: 4.3 10*3/uL (ref 4.0–10.5)
nRBC: 0 % (ref 0.0–0.2)

## 2021-08-03 NOTE — Progress Notes (Signed)
I did not see this patient for evaluation, x ray was requested by [facility] and I did not receive note back on this. X ray results show new pneumonia, needs additional inpatient treatment for pneumonia likely given his high risk. Looks as if son was taking to the emergency room. Given his history from chart review, I do advise to be seen in the ER for further treatment of persistent pneumonia on x ray.  Unable to reach patient/ family 08/03/21 9:38 pm  x 3 attempts -  provider not in office on Fridays. Please call patient and try to reach family as soon as possible.  He needs labs and further work up at ED and pulmonary referral if not seeing.Recommend ER now.  Sent Mychart message

## 2021-08-03 NOTE — Telephone Encounter (Signed)
Patient son came in with patient and wanted Temp check on father due to no thermometer at home, advised would need to collect all vitals, Patient vitals 140/70 pulse 72 , 02 sats 95 % on room air, temp 100.0 oral, respiration 20.  Fraser Din was DX with aspiration pneumonia person by [facility] NP prescribed Augmentin  and prednisone has had only one dose of Augmentin last night, advised son on importance of taking Antibiotic appropriately trying to give 1 tablet twice daily at 12 hour intervals. Son had given patient 1000 mg tylenol at 0915. Patient also prescribed albuterol inhaler has not used to day .  Patient alert oriented in no distress. Advised son to continue [facility] treatment plan and to call them back for re-evaluation if symptoms worsen, or take patient to ER. Discussed with Doc of the day and plan agreed for worsening symptoms to go to ED or call [facility] back to patients residence.

## 2021-08-03 NOTE — ED Triage Notes (Signed)
Pts son was contacted regarding an x-ray with some opacities and was told to come to the ER. Pt denies any pain, shortness of breath and does not know why he is here.

## 2021-08-03 NOTE — Telephone Encounter (Signed)
I ordered the chest xray for [facility] and I called the patients son and informed him to go there and have a chest xray and he understood.  Jamiracle Avants,cma

## 2021-08-03 NOTE — Telephone Encounter (Signed)
Agree if worsening fatigue, confusion, sob, fever rec ED   Dr. Olivia Mackie MCLean-Scocuzza

## 2021-08-03 NOTE — Addendum Note (Signed)
Addended by: Fulton Mole D on: 08/03/2021 08:34 AM   Modules accepted: Orders

## 2021-08-04 ENCOUNTER — Emergency Department
Admission: EM | Admit: 2021-08-04 | Discharge: 2021-08-04 | Disposition: A | Discharge status: Home / Self Care | Payer: PPO | Attending: Emergency Medicine | Admitting: Emergency Medicine

## 2021-08-04 LAB — RESP PANEL BY RT-PCR (FLU A&B, COVID) ARPGX2
Influenza A by PCR: NEGATIVE
Influenza B by PCR: NEGATIVE
SARS Coronavirus 2 by RT PCR: POSITIVE — AB

## 2021-08-04 LAB — URINALYSIS, ROUTINE W REFLEX MICROSCOPIC
Bacteria, UA: NONE SEEN
Bilirubin Urine: NEGATIVE
Glucose, UA: NEGATIVE mg/dL
Hgb urine dipstick: NEGATIVE
Ketones, ur: 5 mg/dL — AB
Leukocytes,Ua: NEGATIVE
Nitrite: NEGATIVE
Protein, ur: 30 mg/dL — AB
Specific Gravity, Urine: 1.029 (ref 1.005–1.030)
pH: 5 (ref 5.0–8.0)

## 2021-08-04 LAB — PROCALCITONIN: Procalcitonin: <0.1 ng/mL

## 2021-08-04 LAB — LACTIC ACID, PLASMA: Lactic Acid, Venous: 0.8 mmol/L (ref 0.5–1.9)

## 2021-08-07 NOTE — Progress Notes (Signed)
Thank you for checking on him, please find out if [facility] is unable to order x rays and labs ? If they have a treating provider I would think they can order as well , never had this situation. If he has any worsening symptoms he needs to be seen in this office or other medical facility if we are closed for evaluation since we have not seen him. Follow treatment plan from office  if not improving or any worsening within 72 hours and also return to office or open medical facility at ANYTIME if any symptoms persist, change, or worsen or you have any further concerns or questions. Call 911 immediately for emergencies.

## 2021-08-08 DIAGNOSIS — Z8701 Personal history of pneumonia (recurrent): Secondary | ICD-10-CM | POA: Diagnosis not present

## 2021-08-08 DIAGNOSIS — Z09 Encounter for follow-up examination after completed treatment for conditions other than malignant neoplasm: Secondary | ICD-10-CM | POA: Diagnosis not present

## 2021-08-08 LAB — CULTURE, BLOOD (ROUTINE X 2): Culture: NO GROWTH

## 2021-08-09 LAB — CULTURE, BLOOD (ROUTINE X 2): Culture: NO GROWTH

## 2021-08-16 DIAGNOSIS — Z515 Encounter for palliative care: Secondary | ICD-10-CM | POA: Diagnosis not present

## 2021-08-16 DIAGNOSIS — C9140 Hairy cell leukemia not having achieved remission: Secondary | ICD-10-CM | POA: Diagnosis not present

## 2021-08-16 DIAGNOSIS — I509 Heart failure, unspecified: Secondary | ICD-10-CM | POA: Diagnosis not present

## 2021-08-16 DIAGNOSIS — I739 Peripheral vascular disease, unspecified: Secondary | ICD-10-CM | POA: Diagnosis not present

## 2021-08-16 DIAGNOSIS — N1831 Chronic kidney disease, stage 3a: Secondary | ICD-10-CM | POA: Diagnosis not present

## 2021-08-16 DIAGNOSIS — D696 Thrombocytopenia, unspecified: Secondary | ICD-10-CM | POA: Diagnosis not present

## 2021-08-16 DIAGNOSIS — Z9183 Wandering in diseases classified elsewhere: Secondary | ICD-10-CM | POA: Diagnosis not present

## 2021-08-16 DIAGNOSIS — J449 Chronic obstructive pulmonary disease, unspecified: Secondary | ICD-10-CM | POA: Diagnosis not present

## 2021-08-16 DIAGNOSIS — F0154 Vascular dementia, unspecified severity, with anxiety: Secondary | ICD-10-CM | POA: Diagnosis not present

## 2021-08-16 DIAGNOSIS — Z7982 Long term (current) use of aspirin: Secondary | ICD-10-CM | POA: Diagnosis not present

## 2021-08-16 DIAGNOSIS — F01518 Vascular dementia, unspecified severity, with other behavioral disturbance: Secondary | ICD-10-CM | POA: Diagnosis not present

## 2021-09-07 DIAGNOSIS — F01518 Vascular dementia, unspecified severity, with other behavioral disturbance: Secondary | ICD-10-CM | POA: Diagnosis not present

## 2021-09-13 DIAGNOSIS — I509 Heart failure, unspecified: Secondary | ICD-10-CM | POA: Diagnosis not present

## 2021-09-13 DIAGNOSIS — D696 Thrombocytopenia, unspecified: Secondary | ICD-10-CM | POA: Diagnosis not present

## 2021-09-13 DIAGNOSIS — Z515 Encounter for palliative care: Secondary | ICD-10-CM | POA: Diagnosis not present

## 2021-09-13 DIAGNOSIS — N1831 Chronic kidney disease, stage 3a: Secondary | ICD-10-CM | POA: Diagnosis not present

## 2021-09-13 DIAGNOSIS — J449 Chronic obstructive pulmonary disease, unspecified: Secondary | ICD-10-CM | POA: Diagnosis not present

## 2021-09-13 DIAGNOSIS — Z9183 Wandering in diseases classified elsewhere: Secondary | ICD-10-CM | POA: Diagnosis not present

## 2021-09-13 DIAGNOSIS — C9140 Hairy cell leukemia not having achieved remission: Secondary | ICD-10-CM | POA: Diagnosis not present

## 2021-09-13 DIAGNOSIS — I739 Peripheral vascular disease, unspecified: Secondary | ICD-10-CM | POA: Diagnosis not present

## 2021-09-13 DIAGNOSIS — Z66 Do not resuscitate: Secondary | ICD-10-CM | POA: Diagnosis not present

## 2021-10-08 DIAGNOSIS — Z0289 Encounter for other administrative examinations: Secondary | ICD-10-CM | POA: Diagnosis not present

## 2021-10-15 ENCOUNTER — Ambulatory Visit (INDEPENDENT_AMBULATORY_CARE_PROVIDER_SITE_OTHER): Payer: PPO | Admitting: Family Medicine

## 2021-10-15 ENCOUNTER — Other Ambulatory Visit: Payer: Self-pay

## 2021-10-15 ENCOUNTER — Ambulatory Visit: Payer: PPO | Admitting: Family Medicine

## 2021-10-15 ENCOUNTER — Encounter: Payer: Self-pay | Admitting: Family Medicine

## 2021-10-15 VITALS — BP 118/60 | HR 53 | Temp 97.9°F | Ht 69.0 in | Wt 164.6 lb

## 2021-10-15 DIAGNOSIS — W19XXXA Unspecified fall, initial encounter: Secondary | ICD-10-CM

## 2021-10-15 DIAGNOSIS — F419 Anxiety disorder, unspecified: Secondary | ICD-10-CM | POA: Diagnosis not present

## 2021-10-15 DIAGNOSIS — L03211 Cellulitis of face: Secondary | ICD-10-CM | POA: Insufficient documentation

## 2021-10-15 DIAGNOSIS — F0390 Unspecified dementia without behavioral disturbance: Secondary | ICD-10-CM | POA: Diagnosis not present

## 2021-10-15 DIAGNOSIS — Z5181 Encounter for therapeutic drug level monitoring: Secondary | ICD-10-CM

## 2021-10-15 DIAGNOSIS — H6123 Impacted cerumen, bilateral: Secondary | ICD-10-CM

## 2021-10-15 DIAGNOSIS — H6120 Impacted cerumen, unspecified ear: Secondary | ICD-10-CM | POA: Insufficient documentation

## 2021-10-15 LAB — COMPREHENSIVE METABOLIC PANEL
ALT: 8 U/L (ref 0–53)
AST: 17 U/L (ref 0–37)
Albumin: 4 g/dL (ref 3.5–5.2)
Alkaline Phosphatase: 65 U/L (ref 39–117)
BUN: 20 mg/dL (ref 6–23)
CO2: 27 mEq/L (ref 19–32)
Calcium: 9.7 mg/dL (ref 8.4–10.5)
Chloride: 106 mEq/L (ref 96–112)
Creatinine, Ser: 1.48 mg/dL (ref 0.40–1.50)
GFR: 42.39 mL/min — ABNORMAL LOW (ref >60.00)
Glucose, Bld: 93 mg/dL (ref 70–99)
Potassium: 3.8 mEq/L (ref 3.5–5.1)
Sodium: 140 mEq/L (ref 135–145)
Total Bilirubin: 0.4 mg/dL (ref 0.2–1.2)
Total Protein: 6.5 g/dL (ref 6.0–8.3)

## 2021-10-15 MED ORDER — CEFADROXIL 500 MG PO CAPS: 500.0000 mg | ORAL_CAPSULE | Freq: Every day | ORAL | 0 refills | Status: DC

## 2021-10-15 NOTE — Assessment & Plan Note (Signed)
Irrigated by [employer] and tolerated well.  TMs appear normal after irrigation. ?

## 2021-10-15 NOTE — Progress Notes (Signed)
Patient presented for ear irrigation due to cerumen impaction in both ears. Patient was informed of the possible side effects of having their ear flushed; light headedness, dizziness, nausea, vomiting and rupture ear drum. Items sed or that can be used are the elephant pump, catch basin, ear curettes, hydrogen peroxide (half a bottle for ear irrigation solution), and a stool softener (1-2CC for softening ear wax). Patient has given a verbal consent to have ear irrigation. patient voiced no concerns nor showed any signs of distress during procedure. Ear canal has become visibly clear. ? ?

## 2021-10-15 NOTE — Assessment & Plan Note (Signed)
Concern for early cellulitis surrounding the wound on his nose.  We will treat with cefadroxil 500 mg once daily given his creatinine clearance.  If he has any spreading redness or worsening symptoms they will seek medical attention. ?

## 2021-10-15 NOTE — Assessment & Plan Note (Signed)
He will continue Zoloft 50 mg once daily.  This has provided benefit. ?

## 2021-10-15 NOTE — Patient Instructions (Signed)
Nice to see you. ?Please start on the antibiotic for the possible infection surrounding your nose. ?If you develop headache, numbness, weakness, vision changes, or any other's change in symptoms please seek medical attention again. ?
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
## 2021-10-15 NOTE — Assessment & Plan Note (Signed)
Generally stable.  They can continue Namenda 5 mg twice daily. ?
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
## 2021-10-18 ENCOUNTER — Telehealth: Payer: Self-pay | Admitting: Family Medicine

## 2021-10-18 DIAGNOSIS — Z7982 Long term (current) use of aspirin: Secondary | ICD-10-CM | POA: Diagnosis not present

## 2021-10-18 DIAGNOSIS — F0390 Unspecified dementia without behavioral disturbance: Secondary | ICD-10-CM

## 2021-10-18 DIAGNOSIS — N400 Enlarged prostate without lower urinary tract symptoms: Secondary | ICD-10-CM | POA: Diagnosis not present

## 2021-10-18 DIAGNOSIS — Z951 Presence of aortocoronary bypass graft: Secondary | ICD-10-CM | POA: Diagnosis not present

## 2021-10-18 DIAGNOSIS — F419 Anxiety disorder, unspecified: Secondary | ICD-10-CM

## 2021-10-18 DIAGNOSIS — Z515 Encounter for palliative care: Secondary | ICD-10-CM | POA: Diagnosis not present

## 2021-10-18 DIAGNOSIS — I25118 Atherosclerotic heart disease of native coronary artery with other forms of angina pectoris: Secondary | ICD-10-CM | POA: Diagnosis not present

## 2021-10-18 NOTE — Telephone Encounter (Signed)
[place] PA From [facility] called in requesting for increase of dosage on medication (Expired - sertraline (ZOLOFT) 50 MG tablet) for pt. Frank Mcdaniel stated with the increase dosage it can target symptoms. Daryl Eastern is requesting callback ?

## 2021-10-19 NOTE — Telephone Encounter (Signed)
Please reach out to the patients son and make sure they are ok with increasing the dose. If they are I can send in a new dose for him.  ?

## 2021-10-20 ENCOUNTER — Other Ambulatory Visit: Payer: Self-pay | Admitting: Family Medicine

## 2021-10-22 MED ORDER — SERTRALINE HCL 100 MG PO TABS: 100.0000 mg | ORAL_TABLET | Freq: Every day | ORAL | 1 refills | Status: DC

## 2021-10-22 NOTE — Addendum Note (Signed)
Addended by: Caryl Bis, Miles Borkowski G on: 10/22/2021 12:10 PM ? ? Modules accepted: Orders ? ?

## 2021-10-22 NOTE — Telephone Encounter (Signed)
Zoloft 100 mg tablets sent to pharmacy to take once table daily.  ?

## 2021-10-22 NOTE — Telephone Encounter (Signed)
I called and spoke with the patients son and he is okay with the increase of the Zoloft for the patients and you can send to their pharmacy.  Brack Shaddock,cma  ?

## 2021-10-23 ENCOUNTER — Inpatient Hospital Stay: Payer: PPO | Admitting: Nurse Practitioner

## 2021-10-23 ENCOUNTER — Other Ambulatory Visit: Payer: Self-pay

## 2021-10-23 ENCOUNTER — Inpatient Hospital Stay: Payer: PPO | Attending: Oncology

## 2021-10-23 VITALS — BP 129/62 | HR 50 | Temp 97.9°F | Resp 18 | Ht 69.0 in | Wt 166.0 lb

## 2021-10-23 DIAGNOSIS — D649 Anemia, unspecified: Secondary | ICD-10-CM | POA: Insufficient documentation

## 2021-10-23 DIAGNOSIS — C9141 Hairy cell leukemia, in remission: Secondary | ICD-10-CM | POA: Diagnosis not present

## 2021-10-23 DIAGNOSIS — C9140 Hairy cell leukemia not having achieved remission: Secondary | ICD-10-CM | POA: Insufficient documentation

## 2021-10-23 DIAGNOSIS — C9142 Hairy cell leukemia, in relapse: Secondary | ICD-10-CM

## 2021-10-23 DIAGNOSIS — I129 Hypertensive chronic kidney disease with stage 1 through stage 4 chronic kidney disease, or unspecified chronic kidney disease: Secondary | ICD-10-CM | POA: Diagnosis not present

## 2021-10-23 DIAGNOSIS — N183 Chronic kidney disease, stage 3 unspecified: Secondary | ICD-10-CM | POA: Insufficient documentation

## 2021-10-23 LAB — CBC WITH DIFFERENTIAL/PLATELET
Abs Immature Granulocytes: 0.05 10*3/uL (ref 0.00–0.07)
Basophils Absolute: 0 10*3/uL (ref 0.0–0.1)
Basophils Relative: 0 %
Eosinophils Absolute: 0.2 10*3/uL (ref 0.0–0.5)
Eosinophils Relative: 4 %
HCT: 35.1 % — ABNORMAL LOW (ref 39.0–52.0)
Hemoglobin: 11.3 g/dL — ABNORMAL LOW (ref 13.0–17.0)
Immature Granulocytes: 1 %
Lymphocytes Relative: 12 %
Lymphs Abs: 0.5 10*3/uL — ABNORMAL LOW (ref 0.7–4.0)
MCH: 31.9 pg (ref 26.0–34.0)
MCHC: 32.2 g/dL (ref 30.0–36.0)
MCV: 99.2 fL (ref 80.0–100.0)
Monocytes Absolute: 0.4 10*3/uL (ref 0.1–1.0)
Monocytes Relative: 10 %
Neutro Abs: 3.4 10*3/uL (ref 1.7–7.7)
Neutrophils Relative %: 73 %
Platelets: 181 10*3/uL (ref 150–400)
RBC: 3.54 MIL/uL — ABNORMAL LOW (ref 4.22–5.81)
RDW: 13.6 % (ref 11.5–15.5)
WBC: 4.6 10*3/uL (ref 4.0–10.5)
nRBC: 0 % (ref 0.0–0.2)

## 2021-10-23 LAB — BASIC METABOLIC PANEL
Anion gap: 7 (ref 5–15)
BUN: 20 mg/dL (ref 8–23)
CO2: 26 mmol/L (ref 22–32)
Calcium: 9.2 mg/dL (ref 8.9–10.3)
Chloride: 104 mmol/L (ref 98–111)
Creatinine, Ser: 1.46 mg/dL — ABNORMAL HIGH (ref 0.61–1.24)
GFR, Estimated: 46 mL/min — ABNORMAL LOW (ref >60)
Glucose, Bld: 93 mg/dL (ref 70–99)
Potassium: 4.2 mmol/L (ref 3.5–5.1)
Sodium: 137 mmol/L (ref 135–145)

## 2021-10-23 NOTE — Progress Notes (Signed)
?[facility]  ?[phone] [phone] ? ?ID: Hilbert Corrigan OB: 02-10-1933  MR#: [id number]  ZHY#:[id number] ? ?Patient Care Team: ?[name], MD as PCP - General (Family Medicine) ?[name], MD as PCP - Cardiology (Cardiology) ?[name], MD as Consulting Physician (Cardiology) ?[name], MD as Consulting Physician (Oncology) ?[name] as Physician Assistant (Urology) ? ?CHIEF COMPLAINT: Recurrent hairy cell leukemia ? ?INTERVAL HISTORY: Patient returns to clinic for repeat laboratory work and routine 3 month evaluation. He continues to feel well and denies complaints. He fell at home a few days ago after tripping. No other falls. He has ongoing baseline confusion and much of history is provided by son. He has chronic weakness and fatigue but this is stable. Appetite is fair and weight is stable. No cough or voice changes. No neurologic complaints. Denies fevers, chills, night sweats. No chest pain, shortness of breath, or hemoptysis. Denies nausea, vomiting, diarrhea, or constipation. Denies urinary complaints. No further specific complaints today.  ? ?REVIEW OF SYSTEMS:   ?Review of Systems  ?Constitutional:  Positive for malaise/fatigue. Negative for fever and weight loss.  ?HENT:  Negative for sore throat.   ?Respiratory: Negative.  Negative for cough, hemoptysis and shortness of breath.   ?Cardiovascular: Negative.  Negative for chest pain and leg swelling.  ?Gastrointestinal: Negative.  Negative for abdominal pain.  ?Genitourinary: Negative.  Negative for dysuria.  ?Musculoskeletal: Negative.  Negative for back pain.  ?Skin: Negative.  Negative for rash.  ?Neurological:  Positive for weakness. Negative for dizziness, focal weakness and headaches.  ?Psychiatric/Behavioral:  Positive for memory loss. The patient is not nervous/anxious.   ?As per HPI. Otherwise, a complete review of systems is negative. ? ?PAST MEDICAL  HISTORY: ?Past Medical History:  ?Diagnosis Date  ? Anemia 09/01/2015  ? Anxiety 09/11/2015  ? Arthritis   ? Asthma   ? as a teenager  ? Asymptomatic Sinus Bradycardia   ? Atypical chest pain   ? a. 08/2014  ? BPH (benign prostatic hyperplasia)   ? CKD (chronic kidney disease), stage III ([place])   ? Closed fracture of tuft of distal phalanx of finger 08/31/2018  ? Community acquired pneumonia 08/10/2020  ? Coronary artery disease   ? a. 2000 s/p CABG;  b. 10/2008 Neg MV, EF 64%; c. cath 05/2015: LM 85% sev cal, pLAD-1 lesion 80%, pLAD-2 lesion 100% chronic, ostLCx 70% sev cal, OM4 90%, ostRCA 70%, mid RCA 90%, LIMA-LAD patent, VG-OM2 patent, VG-OM3 patent, VG-RPDA patent. No AS  ? Dementia without behavioral disturbance ([place]) 05/28/2019  ? GERD (gastroesophageal reflux disease)   ? H/O echocardiogram   ? a. 10/2005 Echo: nl EF.  ? Hairy cell leukemia ([place])   ? HOH (hard of hearing)   ? Bilateral hearing aids  ? HTN (hypertension)   ? Hyperlipidemia   ? Hypertension   ? Leukemia ([place])   ? Neutropenic fever ([place]) 11/08/2019  ? Status post inguinal hernia repair 09/13/2016  ? Stricture and stenosis of esophagus   ? ? ?PAST SURGICAL HISTORY: ?Past Surgical History:  ?Procedure Laterality Date  ? CARDIAC CATHETERIZATION N/A 05/26/2015  ? Procedure: Left Heart Cath;  Surgeon: [name], MD;  Location: [facility];  Service: Cardiovascular;  Laterality: N/A;  ? CARDIAC CATHETERIZATION N/A 05/26/2015  ? Procedure: Coronary/Graft Angiography;  Surgeon: [name], MD;  Location: [facility];  Service: Cardiovascular;  Laterality: N/A;  ? CATARACT EXTRACTION    ?  CORONARY ARTERY BYPASS GRAFT  2000  ? ESOPHAGOGASTRODUODENOSCOPY N/A 02/18/2017  ? Procedure: ESOPHAGOGASTRODUODENOSCOPY (EGD) with removal of food bolus;  Surgeon: [name], MD;  Location: [facility] ENDOSCOPY;  Service: Endoscopy;  Laterality: N/A;  ? ESOPHAGOGASTRODUODENOSCOPY (EGD) WITH PROPOFOL N/A 03/11/2017  ? Procedure: ESOPHAGOGASTRODUODENOSCOPY  (EGD) WITH PROPOFOL;  Surgeon: [name], MD;  Location: [facility] ENDOSCOPY;  Service: Endoscopy;  Laterality: N/A;  ? ESOPHAGOGASTRODUODENOSCOPY (EGD) WITH PROPOFOL N/A 04/15/2017  ? Procedure: ESOPHAGOGASTRODUODENOSCOPY (EGD) WITH PROPOFOL;  Surgeon: [name], MD;  Location: [facility] ENDOSCOPY;  Service: Endoscopy;  Laterality: N/A;  ? ESOPHAGOGASTRODUODENOSCOPY (EGD) WITH PROPOFOL N/A 09/12/2020  ? Procedure: ESOPHAGOGASTRODUODENOSCOPY (EGD) WITH PROPOFOL;  Surgeon: Lin Landsman, MD;  Location: [facility] ENDOSCOPY;  Service: Gastroenterology;  Laterality: N/A;  ? EYE SURGERY Left   ? Cataract Extraction with IOL  ? INGUINAL HERNIA REPAIR Left 06/24/2016  ? Procedure: HERNIA REPAIR INGUINAL ADULT;  Surgeon: [name], MD;  Location: [facility] ORS;  Service: General;  Laterality: Left;  ? TONSILLECTOMY  1942   ? UPPER GASTROINTESTINAL ENDOSCOPY  3 years ago   ? with Dilation.   ? ? ?FAMILY HISTORY ?Family History  ?Problem Relation Age of Onset  ? Hypertension Mother   ? Arthritis Father   ? Other Other   ?     no premature CAD.  ? Colon cancer Neg Hx   ? Prostate cancer Neg Hx   ? Stomach cancer Neg Hx   ? ? ? ADVANCED DIRECTIVES:  ? ? ?HEALTH MAINTENANCE: ?Social History  ? ?Tobacco Use  ? Smoking status: Never  ? Smokeless tobacco: Never  ?Vaping Use  ? Vaping Use: Never used  ?Substance Use Topics  ? Alcohol use: No  ? Drug use: No  ? ? ?No Known Allergies ? ?Current Outpatient Medications  ?Medication Sig Dispense Refill  ? aspirin EC 81 MG tablet Take 1 tablet (81 mg total) by mouth daily. Swallow whole. 90 tablet 3  ? atorvastatin (LIPITOR) 40 MG tablet TAKE 1/2 TABLETS (20 MG TOTAL) BY MOUTH DAILY. 45 tablet 3  ? Calcium Carbonate Antacid (TUMS CHEWY BITES PO) Take 1 tablet by mouth as needed.     ? cefadroxil (DURICEF) 500 MG capsule Take 1 capsule (500 mg total) by mouth daily. 7 capsule 0  ? famotidine (PEPCID) 20 MG tablet TAKE 1 TABLET BY MOUTH EVERY DAY 90 tablet 3  ? finasteride (PROSCAR) 5 MG  tablet TAKE 1 TABLET BY MOUTH EVERY DAY 90 tablet 3  ? isosorbide mononitrate (IMDUR) 30 MG 24 hr tablet TAKE 1 TABLET BY MOUTH EVERY DAY 90 tablet 0  ? memantine (NAMENDA) 5 MG tablet TAKE 1 TABLET BY MOUTH TWICE A DAY 180 tablet 1  ? Multiple Vitamin (MULTIVITAMIN) tablet Take 1 tablet by mouth daily.    ? pantoprazole (PROTONIX) 40 MG tablet TAKE 1 TABLET BY MOUTH EVERY DAY 90 tablet 1  ? sertraline (ZOLOFT) 100 MG tablet Take 1 tablet (100 mg total) by mouth daily. 90 tablet 1  ? tamsulosin (FLOMAX) 0.4 MG CAPS capsule TAKE 2 CAPSULES BY MOUTH EVERY DAY 180 capsule 1  ? ?No current facility-administered medications for this visit.  ? ? ?OBJECTIVE: ?Vitals:  ? 10/23/21 1031  ?BP: 129/62  ?Pulse: (!) 50  ?Resp: 18  ?Temp: 97.9 ?F (36.6 ?C)  ?SpO2: 100%  ?   Body mass index is 24.51 kg/m?[name]    ECOG FS:1 - Symptomatic but completely ambulatory ? ?General: Frail. no acute distress. Accompanied by son.  ?  Eyes: Pink conjunctiva, anicteric sclera. ?Lungs: Clear to auscultation bilaterally.  No audible wheezing or coughing ?Heart: Regular rate and rhythm.  ?Abdomen: Soft, nontender, nondistended.  ?Musculoskeletal: No edema, cyanosis, or clubbing. ?Neuro: Alert. Short answers. Cranial nerves grossly intact. ?Skin: No rashes or petechiae noted. ?Psych: Normal affect. ? ? ?LAB RESULTS: ? ?Lab Results  ?Component Value Date  ? NA 137 10/23/2021  ? K 4.2 10/23/2021  ? CL 104 10/23/2021  ? CO2 26 10/23/2021  ? GLUCOSE 93 10/23/2021  ? BUN 20 10/23/2021  ? CREATININE 1.46 (H) 10/23/2021  ? CALCIUM 9.2 10/23/2021  ? PROT 6.5 10/15/2021  ? ALBUMIN 4.0 10/15/2021  ? AST 17 10/15/2021  ? ALT 8 10/15/2021  ? ALKPHOS 65 10/15/2021  ? BILITOT 0.4 10/15/2021  ? GFRNONAA 46 (L) 10/23/2021  ? GFRAA 50 (L) 02/08/2020  ? ? ?Lab Results  ?Component Value Date  ? WBC 4.6 10/23/2021  ? NEUTROABS 3.4 10/23/2021  ? HGB 11.3 (L) 10/23/2021  ? HCT 35.1 (L) 10/23/2021  ? MCV 99.2 10/23/2021  ? PLT 181 10/23/2021  ? ? ?STUDIES: ?No results found. ?   ? ?ONCOLOGY HISTORY:  Patient initially received cladribine in 2006 and then was treated for a relapse in 2011 and again in 2014. Bone marrow biopsy at that time confirmed recurrence of disease with 80-9

## 2021-10-30 ENCOUNTER — Telehealth: Payer: Self-pay | Admitting: Family Medicine

## 2021-10-30 NOTE — Telephone Encounter (Signed)
Called and spoke with the patients son. Long term care form filled out. Please fax and attach his most recent visit note.  ?

## 2021-10-30 NOTE — Telephone Encounter (Signed)
I faxed the form from the provider to [facility] and I received a fax confirmation.  Migel Hannis,cma  ? ? ?

## 2021-11-06 ENCOUNTER — Emergency Department
Admission: EM | Admit: 2021-11-06 | Discharge: 2021-11-06 | Disposition: A | Discharge status: Home / Self Care | Payer: PPO | Attending: Emergency Medicine | Admitting: Emergency Medicine

## 2021-11-06 ENCOUNTER — Emergency Department: Payer: PPO

## 2021-11-06 DIAGNOSIS — I129 Hypertensive chronic kidney disease with stage 1 through stage 4 chronic kidney disease, or unspecified chronic kidney disease: Secondary | ICD-10-CM | POA: Insufficient documentation

## 2021-11-06 DIAGNOSIS — Z20822 Contact with and (suspected) exposure to covid-19: Secondary | ICD-10-CM | POA: Insufficient documentation

## 2021-11-06 DIAGNOSIS — J45909 Unspecified asthma, uncomplicated: Secondary | ICD-10-CM | POA: Diagnosis not present

## 2021-11-06 DIAGNOSIS — D72829 Elevated white blood cell count, unspecified: Secondary | ICD-10-CM | POA: Diagnosis not present

## 2021-11-06 DIAGNOSIS — R531 Weakness: Secondary | ICD-10-CM

## 2021-11-06 DIAGNOSIS — R350 Frequency of micturition: Secondary | ICD-10-CM | POA: Insufficient documentation

## 2021-11-06 DIAGNOSIS — R509 Fever, unspecified: Secondary | ICD-10-CM | POA: Diagnosis not present

## 2021-11-06 DIAGNOSIS — Z951 Presence of aortocoronary bypass graft: Secondary | ICD-10-CM | POA: Diagnosis not present

## 2021-11-06 DIAGNOSIS — Z7982 Long term (current) use of aspirin: Secondary | ICD-10-CM | POA: Diagnosis not present

## 2021-11-06 DIAGNOSIS — I2510 Atherosclerotic heart disease of native coronary artery without angina pectoris: Secondary | ICD-10-CM | POA: Diagnosis not present

## 2021-11-06 DIAGNOSIS — J9811 Atelectasis: Secondary | ICD-10-CM | POA: Diagnosis not present

## 2021-11-06 DIAGNOSIS — F0390 Unspecified dementia without behavioral disturbance: Secondary | ICD-10-CM | POA: Diagnosis not present

## 2021-11-06 DIAGNOSIS — R0902 Hypoxemia: Secondary | ICD-10-CM | POA: Diagnosis not present

## 2021-11-06 DIAGNOSIS — N1831 Chronic kidney disease, stage 3a: Secondary | ICD-10-CM | POA: Insufficient documentation

## 2021-11-06 DIAGNOSIS — I10 Essential (primary) hypertension: Secondary | ICD-10-CM | POA: Diagnosis not present

## 2021-11-06 DIAGNOSIS — R0689 Other abnormalities of breathing: Secondary | ICD-10-CM | POA: Diagnosis not present

## 2021-11-06 LAB — CBC WITH DIFFERENTIAL/PLATELET
Abs Immature Granulocytes: 0.09 10*3/uL — ABNORMAL HIGH (ref 0.00–0.07)
Basophils Absolute: 0 10*3/uL (ref 0.0–0.1)
Basophils Relative: 0 %
Eosinophils Absolute: 0 10*3/uL (ref 0.0–0.5)
Eosinophils Relative: 0 %
HCT: 32.8 % — ABNORMAL LOW (ref 39.0–52.0)
Hemoglobin: 10.8 g/dL — ABNORMAL LOW (ref 13.0–17.0)
Immature Granulocytes: 1 %
Lymphocytes Relative: 2 %
Lymphs Abs: 0.2 10*3/uL — ABNORMAL LOW (ref 0.7–4.0)
MCH: 31.9 pg (ref 26.0–34.0)
MCHC: 32.9 g/dL (ref 30.0–36.0)
MCV: 96.8 fL (ref 80.0–100.0)
Monocytes Absolute: 0.6 10*3/uL (ref 0.1–1.0)
Monocytes Relative: 6 %
Neutro Abs: 9.8 10*3/uL — ABNORMAL HIGH (ref 1.7–7.7)
Neutrophils Relative %: 91 %
Platelets: 123 10*3/uL — ABNORMAL LOW (ref 150–400)
RBC: 3.39 MIL/uL — ABNORMAL LOW (ref 4.22–5.81)
RDW: 13.5 % (ref 11.5–15.5)
WBC: 10.8 10*3/uL — ABNORMAL HIGH (ref 4.0–10.5)
nRBC: 0 % (ref 0.0–0.2)

## 2021-11-06 LAB — COMPREHENSIVE METABOLIC PANEL
ALT: 13 U/L (ref 0–44)
AST: 23 U/L (ref 15–41)
Albumin: 3.8 g/dL (ref 3.5–5.0)
Alkaline Phosphatase: 61 U/L (ref 38–126)
Anion gap: 7 (ref 5–15)
BUN: 20 mg/dL (ref 8–23)
CO2: 23 mmol/L (ref 22–32)
Calcium: 8.8 mg/dL — ABNORMAL LOW (ref 8.9–10.3)
Chloride: 107 mmol/L (ref 98–111)
Creatinine, Ser: 1.4 mg/dL — ABNORMAL HIGH (ref 0.61–1.24)
GFR, Estimated: 49 mL/min — ABNORMAL LOW (ref >60)
Glucose, Bld: 113 mg/dL — ABNORMAL HIGH (ref 70–99)
Potassium: 3.6 mmol/L (ref 3.5–5.1)
Sodium: 137 mmol/L (ref 135–145)
Total Bilirubin: 0.8 mg/dL (ref 0.3–1.2)
Total Protein: 6.7 g/dL (ref 6.5–8.1)

## 2021-11-06 LAB — URINALYSIS, ROUTINE W REFLEX MICROSCOPIC
Bilirubin Urine: NEGATIVE
Glucose, UA: NEGATIVE mg/dL
Hgb urine dipstick: NEGATIVE
Ketones, ur: NEGATIVE mg/dL
Leukocytes,Ua: NEGATIVE
Nitrite: NEGATIVE
Protein, ur: NEGATIVE mg/dL
Specific Gravity, Urine: 1.014 (ref 1.005–1.030)
pH: 7 (ref 5.0–8.0)

## 2021-11-06 LAB — TROPONIN I (HIGH SENSITIVITY)
Troponin I (High Sensitivity): 31 ng/L — ABNORMAL HIGH (ref <18)
Troponin I (High Sensitivity): 35 ng/L — ABNORMAL HIGH (ref <18)

## 2021-11-06 LAB — RESP PANEL BY RT-PCR (FLU A&B, COVID) ARPGX2
Influenza A by PCR: NEGATIVE
Influenza B by PCR: NEGATIVE
SARS Coronavirus 2 by RT PCR: NEGATIVE

## 2021-11-06 LAB — PROCALCITONIN: Procalcitonin: <0.1 ng/mL

## 2021-11-06 LAB — LACTIC ACID, PLASMA: Lactic Acid, Venous: 0.9 mmol/L (ref 0.5–1.9)

## 2021-11-06 MED ORDER — ACETAMINOPHEN 500 MG PO TABS
1000.0000 mg | ORAL_TABLET | Freq: Once | ORAL | Status: AC
Start: 2021-11-06 — End: 2021-11-06
  Administered 2021-11-06: 1000 mg via ORAL
  Filled 2021-11-06: qty 2

## 2021-11-06 NOTE — ED Provider Notes (Signed)
----------------------------------------- ?  7:07 AM on 11/06/2021 ?----------------------------------------- ? ?Blood pressure (!) 121/53, pulse 67, temperature 99.8 ?F (37.7 ?C), temperature source Oral, resp. rate 19, SpO2 94 %. ? ?Assuming care from Dr. Beather Arbour.  In short, Frank Mcdaniel is a 88 y.o. male with a chief complaint of Fever, Urinary Frequency, and Weakness ?[name]  Refer to the original H&P for additional details. ? ?The current plan of care is to follow-up repeat troponin, if negative patient would be appropriate for discharge home with outpatient follow-up. ? ?----------------------------------------- ?11:00 AM on 11/06/2021 ?----------------------------------------- ?Repeat troponin is stable compared to previous and patient reports feeling better on reassessment.  Procalcitonin was added on and is undetectable, given no source of bacterial infection, suspect viral illness.  Patient is appropriate for discharge home with close PCP follow-up, son counseled to have him return to the ED for new or worsening symptoms.  Patient and family agree with plan. ? ?  ?Blake Divine, MD ?11/06/21 1100 ? ?

## 2021-11-06 NOTE — ED Provider Notes (Signed)
? ?[facility] ?Provider Note ? ? ? Event Date/Time  ? First MD Initiated Contact with Patient 11/06/21 [phone]   ?  (approximate) ? ? ?History  ? ?Fever, Urinary Frequency, and Weakness ? ? ?HPI ? ?Frank Mcdaniel is a 88 y.o. male brought to the ED via EMS from home with a chief complaint of generalized weakness, fever and frequent urination.  Patient states he awoke with chills and generalized weakness.  History of UTIs.  Denies cough, chest pain, shortness of breath, abdominal pain, nausea, vomiting or diarrhea.  EMS reports axillary temperature 103 ?F; no Tylenol given prior to arrival. ?  ? ? ?Past Medical History  ? ?Past Medical History:  ?Diagnosis Date  ? Anemia 09/01/2015  ? Anxiety 09/11/2015  ? Arthritis   ? Asthma   ? as a teenager  ? Asymptomatic Sinus Bradycardia   ? Atypical chest pain   ? a. 08/2014  ? BPH (benign prostatic hyperplasia)   ? CKD (chronic kidney disease), stage III ([place])   ? Closed fracture of tuft of distal phalanx of finger 08/31/2018  ? Community acquired pneumonia 08/10/2020  ? Coronary artery disease   ? a. 2000 s/p CABG;  b. 10/2008 Neg MV, EF 64%; c. cath 05/2015: LM 85% sev cal, pLAD-1 lesion 80%, pLAD-2 lesion 100% chronic, ostLCx 70% sev cal, OM4 90%, ostRCA 70%, mid RCA 90%, LIMA-LAD patent, VG-OM2 patent, VG-OM3 patent, VG-RPDA patent. No AS  ? Dementia without behavioral disturbance ([place]) 05/28/2019  ? GERD (gastroesophageal reflux disease)   ? H/O echocardiogram   ? a. 10/2005 Echo: nl EF.  ? Hairy cell leukemia ([place])   ? HOH (hard of hearing)   ? Bilateral hearing aids  ? HTN (hypertension)   ? Hyperlipidemia   ? Hypertension   ? Leukemia ([place])   ? Neutropenic fever ([place]) 11/08/2019  ? Status post inguinal hernia repair 09/13/2016  ? Stricture and stenosis of esophagus   ? ? ? ?Active Problem List  ? ?Patient Active Problem List  ? Diagnosis Date Noted  ? Cellulitis of face 10/15/2021  ? Cerumen impaction 10/15/2021  ? Pharyngoesophageal phase dysphagia  04/17/2021  ? Fall 11/13/2020  ? CKD (chronic kidney disease), stage IIIa 02/28/2020  ? Pancytopenia ([place]) 01/07/2020  ? IBS (irritable bowel syndrome) 08/02/2019  ? Dementia without behavioral disturbance ([place]) 05/28/2019  ? Constipation 05/21/2016  ? Bilateral low back pain without sciatica 12/21/2015  ? Anxiety 09/11/2015  ? Anemia 09/01/2015  ? GERD (gastroesophageal reflux disease) 06/02/2015  ? Asthma 01/28/2015  ? BPH (benign prostatic hyperplasia) 01/28/2015  ? Hairy cell leukemia ([place]) 01/28/2015  ? Coronary artery disease   ? S/P CABG (coronary artery bypass graft) 11/08/2010  ? HTN (hypertension) 11/08/2010  ? Hyperlipidemia 10/27/2009  ? ? ? ?Past Surgical History  ? ?Past Surgical History:  ?Procedure Laterality Date  ? CARDIAC CATHETERIZATION N/A 05/26/2015  ? Procedure: Left Heart Cath;  Surgeon: [name], MD;  Location: [place] [facility];  Service: Cardiovascular;  Laterality: N/A;  ? CARDIAC CATHETERIZATION N/A 05/26/2015  ? Procedure: Coronary/Graft Angiography;  Surgeon: [name], MD;  Location: [place] [facility];  Service: Cardiovascular;  Laterality: N/A;  ? CATARACT EXTRACTION    ? CORONARY ARTERY BYPASS GRAFT  2000  ? ESOPHAGOGASTRODUODENOSCOPY N/A 02/18/2017  ? Procedure: ESOPHAGOGASTRODUODENOSCOPY (EGD) with removal of food bolus;  Surgeon: [name], MD;  Location[facility] ENDOSCOPY;  Service: Endoscopy;  Laterality: N/A;  ? ESOPHAGOGASTRODUODENOSCOPY (EGD) WITH PROPOFOL  N/A 03/11/2017  ? Procedure: ESOPHAGOGASTRODUODENOSCOPY (EGD) WITH PROPOFOL;  Surgeon: [name], MD;  Location: [facility] ENDOSCOPY;  Service: Endoscopy;  Laterality: N/A;  ? ESOPHAGOGASTRODUODENOSCOPY (EGD) WITH PROPOFOL N/A 04/15/2017  ? Procedure: ESOPHAGOGASTRODUODENOSCOPY (EGD) WITH PROPOFOL;  Surgeon: [name], MD;  Location: [facility] ENDOSCOPY;  Service: Endoscopy;  Laterality: N/A;  ? ESOPHAGOGASTRODUODENOSCOPY (EGD) WITH PROPOFOL N/A 09/12/2020  ? Procedure: ESOPHAGOGASTRODUODENOSCOPY (EGD) WITH  PROPOFOL;  Surgeon: Lin Landsman, MD;  Location: [facility] ENDOSCOPY;  Service: Gastroenterology;  Laterality: N/A;  ? EYE SURGERY Left   ? Cataract Extraction with IOL  ? INGUINAL HERNIA REPAIR Left 06/24/2016  ? Procedure: HERNIA REPAIR INGUINAL ADULT;  Surgeon: [name], MD;  Location: [facility] ORS;  Service: General;  Laterality: Left;  ? TONSILLECTOMY  1942   ? UPPER GASTROINTESTINAL ENDOSCOPY  3 years ago   ? with Dilation.   ? ? ? ?Home Medications  ? ?Prior to Admission medications   ?Medication Sig Start Date End Date Taking? Authorizing Provider  ?aspirin EC 81 MG tablet Take 1 tablet (81 mg total) by mouth daily. Swallow whole. 10/13/20  Yes Gollan, Kathlene November, MD  ?atorvastatin (LIPITOR) 40 MG tablet TAKE 1/2 TABLETS (20 MG TOTAL) BY MOUTH DAILY. 10/22/21  Yes [name], MD  ?Calcium Carbonate Antacid (TUMS CHEWY BITES PO) Take 1 tablet by mouth as needed.    Yes [provider]  ?famotidine (PEPCID) 20 MG tablet TAKE 1 TABLET BY MOUTH EVERY DAY 03/29/21  Yes [name], MD  ?finasteride (PROSCAR) 5 MG tablet TAKE 1 TABLET BY MOUTH EVERY DAY 03/29/21  Yes [name], MD  ?isosorbide mononitrate (IMDUR) 30 MG 24 hr tablet TAKE 1 TABLET BY MOUTH EVERY DAY 08/02/21  Yes Gollan, Kathlene November, MD  ?memantine (NAMENDA) 5 MG tablet TAKE 1 TABLET BY MOUTH TWICE A DAY 06/13/21  Yes [name], MD  ?Multiple Vitamin (MULTIVITAMIN) tablet Take 1 tablet by mouth daily.   Yes [provider]  ?pantoprazole (PROTONIX) 40 MG tablet TAKE 1 TABLET BY MOUTH EVERY DAY 10/22/21  Yes [name], MD  ?sertraline (ZOLOFT) 100 MG tablet Take 1 tablet (100 mg total) by mouth daily. 10/22/21  Yes [name], MD  ?tamsulosin (FLOMAX) 0.4 MG CAPS capsule TAKE 2 CAPSULES BY MOUTH EVERY DAY ?Patient taking differently: 0.4 mg. TAKE 2 CAPSULES BY MOUTH EVERY DAY 06/13/21  Yes [name], MD  ?cefadroxil (DURICEF) 500 MG capsule Take 1 capsule (500 mg total) by mouth  daily. ?Patient not taking: Reported on 11/06/2021 10/15/21   [name], MD  ? ? ? ?Allergies  ?Patient has no known allergies. ? ? ?Family History  ? ?Family History  ?Problem Relation Age of Onset  ? Hypertension Mother   ? Arthritis Father   ? Other Other   ?     no premature CAD.  ? Colon cancer Neg Hx   ? Prostate cancer Neg Hx   ? Stomach cancer Neg Hx   ? ? ? ?Physical Exam  ?Triage Vital Signs: ?ED Triage Vitals  ?Enc Vitals Group  ?   BP   ?   Pulse   ?   Resp   ?   Temp   ?   Temp src   ?   SpO2   ?   Weight   ?   Height   ?   Head Circumference   ?   Peak Flow   ?   Pain  Score   ?   Pain Loc   ?   Pain Edu?   ?   Excl. in [place]?   ? ? ?Updated Vital Signs: ?BP 129/63   Pulse 78   Temp (!) 100.5 ?F (38.1 ?C) (Oral)   Resp (!) 31   SpO2 93%  ? ? ?General: Awake, no distress.  ?CV:  RRR.  Good peripheral perfusion.  ?Resp:  Normal effort.  Diminished aeration bibasilarly. ?Abd:  Nontender to light or deep palpation.  No distention.  ?Other:  Supple neck without meningismus.  No carotid bruits.  No petechiae.  Alert and oriented to person and place.  CN II toXII grossly intact.  5/5 motor strength and sensation all extremities MAEx4. ? ? ?ED Results / Procedures / Treatments  ?Labs ?(all labs ordered are listed, but only abnormal results are displayed) ?Labs Reviewed  ?CBC WITH DIFFERENTIAL/PLATELET - Abnormal; Notable for the following components:  ?    Result Value  ? WBC 10.8 (*)   ? RBC 3.39 (*)   ? Hemoglobin 10.8 (*)   ? HCT 32.8 (*)   ? Platelets 123 (*)   ? Neutro Abs 9.8 (*)   ? Lymphs Abs 0.2 (*)   ? Abs Immature Granulocytes 0.09 (*)   ? All other components within normal limits  ?COMPREHENSIVE METABOLIC PANEL - Abnormal; Notable for the following components:  ? Glucose, Bld 113 (*)   ? Creatinine, Ser 1.40 (*)   ? Calcium 8.8 (*)   ? GFR, Estimated 49 (*)   ? All other components within normal limits  ?TROPONIN I (HIGH SENSITIVITY) - Abnormal; Notable for the following components:  ?  Troponin I (High Sensitivity) 31 (*)   ? All other components within normal limits  ?RESP PANEL BY RT-PCR (FLU A&B, COVID) ARPGX2  ?CULTURE, BLOOD (ROUTINE X 2)  ?CULTURE, BLOOD (ROUTINE X 2)  ?URINE CULTURE  ?LACTIC ACI

## 2021-11-06 NOTE — ED Triage Notes (Signed)
Per son at bedside this is the pt's baseline mentation.  ?

## 2021-11-06 NOTE — ED Triage Notes (Signed)
Pt brought from home via EMS for weakness, fever and altered mental status. Pt states he has frequent urination.  ?

## 2021-11-07 ENCOUNTER — Ambulatory Visit (INDEPENDENT_AMBULATORY_CARE_PROVIDER_SITE_OTHER): Payer: PPO | Admitting: Family Medicine

## 2021-11-07 ENCOUNTER — Encounter: Payer: Self-pay | Admitting: Family Medicine

## 2021-11-07 DIAGNOSIS — B349 Viral infection, unspecified: Secondary | ICD-10-CM | POA: Insufficient documentation

## 2021-11-07 LAB — URINE CULTURE: Culture: NO GROWTH

## 2021-11-07 NOTE — Assessment & Plan Note (Signed)
The patient symptoms are likely related to a viral illness of undetermined type.  He seems to have improved at this point.  Discussed that they can discontinue the Tylenol to see if his fever returns.  Advised that fever related to viral illnesses could last up to 5 to 7 days.  If he has persistent temperature above 100.4 ?F or if he starts to feel worse again he will be reevaluated. ?

## 2021-11-07 NOTE — Progress Notes (Signed)
?[name], MD ?Phone: [phone] ? ?Frank Mcdaniel is a 88 y.o. male who presents today for ED follow-up visit. ? ?Viral illness: The patient's son provides most of the history.  Reports the patient started to feel ill in the middle of the night on Monday.  He had a temperature of 103 ?F.  He had trouble getting up on his own.  EMS transported him to the emergency department.  He underwent an extensive evaluation that revealed a negative COVID and flu test.  He had a negative urinalysis and urine culture.  Blood cultures thus far are negative.  His procalcitonin was less than 0.1.  His troponins were stable.  Lactic acid was reassuring.  He was not neutropenic.  They discharged him advising that this is likely a viral illness.  They advised that he take Tylenol to help with this and rest and stay hydrated.  His son notes he seems back to normal at this point.  He has gotten Tylenol 3 times over the last 24 hours.  His son notes he never had any respiratory symptoms.  The patient denies abdominal pain, vomiting, and diarrhea.  He has not had any new urinary symptoms. ? ?Social History  ? ?Tobacco Use  ?Smoking Status Never  ?Smokeless Tobacco Never  ? ? ?Current Outpatient Medications on File Prior to Visit  ?Medication Sig Dispense Refill  ? aspirin EC 81 MG tablet Take 1 tablet (81 mg total) by mouth daily. Swallow whole. 90 tablet 3  ? atorvastatin (LIPITOR) 40 MG tablet TAKE 1/2 TABLETS (20 MG TOTAL) BY MOUTH DAILY. 45 tablet 3  ? Calcium Carbonate Antacid (TUMS CHEWY BITES PO) Take 1 tablet by mouth as needed.     ? cefadroxil (DURICEF) 500 MG capsule Take 1 capsule (500 mg total) by mouth daily. 7 capsule 0  ? famotidine (PEPCID) 20 MG tablet TAKE 1 TABLET BY MOUTH EVERY DAY 90 tablet 3  ? finasteride (PROSCAR) 5 MG tablet TAKE 1 TABLET BY MOUTH EVERY DAY 90 tablet 3  ? isosorbide mononitrate (IMDUR) 30 MG 24 hr tablet TAKE 1 TABLET BY MOUTH EVERY DAY 90 tablet 0  ? memantine (NAMENDA) 5 MG tablet  TAKE 1 TABLET BY MOUTH TWICE A DAY 180 tablet 1  ? Multiple Vitamin (MULTIVITAMIN) tablet Take 1 tablet by mouth daily.    ? pantoprazole (PROTONIX) 40 MG tablet TAKE 1 TABLET BY MOUTH EVERY DAY 90 tablet 1  ? sertraline (ZOLOFT) 100 MG tablet Take 1 tablet (100 mg total) by mouth daily. 90 tablet 1  ? tamsulosin (FLOMAX) 0.4 MG CAPS capsule TAKE 2 CAPSULES BY MOUTH EVERY DAY (Patient taking differently: 0.4 mg. TAKE 2 CAPSULES BY MOUTH EVERY DAY) 180 capsule 1  ? ?No current facility-administered medications on file prior to visit.  ? ? ? ?ROS see history of present illness ? ?Objective ? ?Physical Exam ?Vitals:  ? 11/07/21 1059  ?BP: 120/60  ?Pulse: 62  ?Temp: 99.2 ?F (37.3 ?C)  ?SpO2: 95%  ? ? ?BP Readings from Last 3 Encounters:  ?11/07/21 120/60  ?11/06/21 (!) 112/52  ?10/23/21 129/62  ? ?Wt Readings from Last 3 Encounters:  ?11/07/21 167 lb 9.6 oz (76 kg)  ?10/23/21 166 lb (75.3 kg)  ?10/15/21 164 lb 9.6 oz (74.7 kg)  ? ? ?Physical Exam ?Constitutional:   ?   General: He is not in acute distress. ?   Appearance: He is not diaphoretic.  ?Cardiovascular:  ?   Rate and Rhythm: Normal rate and  regular rhythm.  ?   Heart sounds: Normal heart sounds.  ?Pulmonary:  ?   Effort: Pulmonary effort is normal.  ?   Breath sounds: Normal breath sounds.  ?Abdominal:  ?   General: Bowel sounds are normal. There is no distension.  ?   Palpations: Abdomen is soft.  ?   Tenderness: There is no abdominal tenderness.  ?Skin: ?   General: Skin is warm and dry.  ?Neurological:  ?   Mental Status: He is alert.  ? ? ? ?Assessment/Plan: Please see individual problem list. ? ?Problem List Items Addressed This Visit   ? ? Viral illness  ?  The patient symptoms are likely related to a viral illness of undetermined type.  He seems to have improved at this point.  Discussed that they can discontinue the Tylenol to see if his fever returns.  Advised that fever related to viral illnesses could last up to 5 to 7 days.  If he has persistent  temperature above 100.4 ?F or if he starts to feel worse again he will be reevaluated. ?  ?  ? ? ? ?Return if symptoms worsen or fail to improve. ? ?This visit occurred during the SARS-CoV-2 public health emergency.  Safety protocols were in place, including screening questions prior to the visit, additional usage of staff PPE, and extensive cleaning of exam room while observing appropriate contact time as indicated for disinfecting solutions.  ? ? ?[name], MD ?[place] ? ?

## 2021-11-07 NOTE — Patient Instructions (Signed)
Nice to see you. ?Glad you are feeling better. ?I suspect you likely had a viral illness that has improved. ?You do not need to continue with the Tylenol unless you start to have fevers again or if you feel poorly.  If the fever returns for more than another few days or if you are feeling progressively worse please be reevaluated. ?

## 2021-11-08 DIAGNOSIS — N390 Urinary tract infection, site not specified: Secondary | ICD-10-CM | POA: Diagnosis not present

## 2021-11-11 LAB — CULTURE, BLOOD (ROUTINE X 2)
Culture: NO GROWTH
Culture: NO GROWTH
Special Requests: ADEQUATE

## 2021-11-14 DIAGNOSIS — Z09 Encounter for follow-up examination after completed treatment for conditions other than malignant neoplasm: Secondary | ICD-10-CM | POA: Diagnosis not present

## 2021-11-14 DIAGNOSIS — Z87440 Personal history of urinary (tract) infections: Secondary | ICD-10-CM | POA: Diagnosis not present

## 2021-11-22 DIAGNOSIS — Z515 Encounter for palliative care: Secondary | ICD-10-CM | POA: Diagnosis not present

## 2021-11-22 DIAGNOSIS — N400 Enlarged prostate without lower urinary tract symptoms: Secondary | ICD-10-CM | POA: Diagnosis not present

## 2021-11-22 DIAGNOSIS — Z951 Presence of aortocoronary bypass graft: Secondary | ICD-10-CM | POA: Diagnosis not present

## 2021-11-22 DIAGNOSIS — F0150 Vascular dementia without behavioral disturbance: Secondary | ICD-10-CM | POA: Diagnosis not present

## 2021-11-22 DIAGNOSIS — Z7982 Long term (current) use of aspirin: Secondary | ICD-10-CM | POA: Diagnosis not present

## 2021-11-22 DIAGNOSIS — I25118 Atherosclerotic heart disease of native coronary artery with other forms of angina pectoris: Secondary | ICD-10-CM | POA: Diagnosis not present

## 2021-12-06 ENCOUNTER — Other Ambulatory Visit: Payer: Self-pay | Admitting: Family Medicine

## 2021-12-17 ENCOUNTER — Other Ambulatory Visit: Payer: Self-pay | Admitting: Family Medicine

## 2021-12-17 ENCOUNTER — Other Ambulatory Visit: Payer: Self-pay | Admitting: Cardiovascular Disease

## 2021-12-17 DIAGNOSIS — E785 Hyperlipidemia, unspecified: Secondary | ICD-10-CM | POA: Diagnosis not present

## 2021-12-17 DIAGNOSIS — F03911 Unspecified dementia, unspecified severity, with agitation: Secondary | ICD-10-CM | POA: Diagnosis not present

## 2021-12-17 DIAGNOSIS — Z66 Do not resuscitate: Secondary | ICD-10-CM | POA: Diagnosis not present

## 2021-12-17 DIAGNOSIS — Z9183 Wandering in diseases classified elsewhere: Secondary | ICD-10-CM | POA: Diagnosis not present

## 2021-12-17 DIAGNOSIS — N400 Enlarged prostate without lower urinary tract symptoms: Secondary | ICD-10-CM | POA: Diagnosis not present

## 2021-12-17 DIAGNOSIS — F0390 Unspecified dementia without behavioral disturbance: Secondary | ICD-10-CM

## 2021-12-17 DIAGNOSIS — F0394 Unspecified dementia, unspecified severity, with anxiety: Secondary | ICD-10-CM | POA: Diagnosis not present

## 2021-12-17 DIAGNOSIS — Z515 Encounter for palliative care: Secondary | ICD-10-CM | POA: Diagnosis not present

## 2021-12-17 DIAGNOSIS — F03918 Unspecified dementia, unspecified severity, with other behavioral disturbance: Secondary | ICD-10-CM | POA: Diagnosis not present

## 2021-12-17 DIAGNOSIS — F419 Anxiety disorder, unspecified: Secondary | ICD-10-CM

## 2021-12-17 NOTE — Telephone Encounter (Signed)
Please contact pt for future appointment. Pt overdue for f/u. Pt needing refills. 

## 2021-12-17 NOTE — Telephone Encounter (Signed)
Attempted to schedule not available will call back  ?

## 2021-12-25 ENCOUNTER — Other Ambulatory Visit: Payer: Self-pay

## 2021-12-25 ENCOUNTER — Telehealth: Payer: Self-pay

## 2021-12-25 DIAGNOSIS — F01518 Vascular dementia, unspecified severity, with other behavioral disturbance: Secondary | ICD-10-CM | POA: Diagnosis not present

## 2021-12-25 NOTE — Telephone Encounter (Signed)
Tajuana Lordeus, Psychiatric PA for [facility], states she has prescribed hydroxyzine ('10mg'$ ) for patient and he has tolerated medication and is doing well on it.  Frank Mcdaniel states she would like to have it added to his medication list.  Frank Mcdaniel states she is closing patient's referral.  Frank Mcdaniel states we should have already received her encounter notes, but if we have not, please let her know.

## 2021-12-26 NOTE — Telephone Encounter (Signed)
Noted  

## 2022-01-14 DIAGNOSIS — Z7982 Long term (current) use of aspirin: Secondary | ICD-10-CM | POA: Diagnosis not present

## 2022-01-14 DIAGNOSIS — I25118 Atherosclerotic heart disease of native coronary artery with other forms of angina pectoris: Secondary | ICD-10-CM | POA: Diagnosis not present

## 2022-01-14 DIAGNOSIS — F0150 Vascular dementia without behavioral disturbance: Secondary | ICD-10-CM | POA: Diagnosis not present

## 2022-01-14 DIAGNOSIS — Z515 Encounter for palliative care: Secondary | ICD-10-CM | POA: Diagnosis not present

## 2022-01-14 DIAGNOSIS — E785 Hyperlipidemia, unspecified: Secondary | ICD-10-CM | POA: Diagnosis not present

## 2022-01-14 DIAGNOSIS — Z951 Presence of aortocoronary bypass graft: Secondary | ICD-10-CM | POA: Diagnosis not present

## 2022-01-22 ENCOUNTER — Other Ambulatory Visit: Payer: Self-pay

## 2022-01-22 ENCOUNTER — Ambulatory Visit (INDEPENDENT_AMBULATORY_CARE_PROVIDER_SITE_OTHER): Payer: PPO | Admitting: Family Medicine

## 2022-01-22 ENCOUNTER — Encounter: Payer: Self-pay | Admitting: Family Medicine

## 2022-01-22 ENCOUNTER — Inpatient Hospital Stay: Payer: PPO | Attending: Oncology

## 2022-01-22 VITALS — BP 120/70 | HR 55 | Temp 98.7°F | Ht 69.0 in | Wt 166.8 lb

## 2022-01-22 DIAGNOSIS — D649 Anemia, unspecified: Secondary | ICD-10-CM | POA: Insufficient documentation

## 2022-01-22 DIAGNOSIS — R351 Nocturia: Secondary | ICD-10-CM

## 2022-01-22 DIAGNOSIS — R7309 Other abnormal glucose: Secondary | ICD-10-CM | POA: Diagnosis not present

## 2022-01-22 DIAGNOSIS — N401 Enlarged prostate with lower urinary tract symptoms: Secondary | ICD-10-CM | POA: Diagnosis not present

## 2022-01-22 DIAGNOSIS — F419 Anxiety disorder, unspecified: Secondary | ICD-10-CM

## 2022-01-22 DIAGNOSIS — F0390 Unspecified dementia without behavioral disturbance: Secondary | ICD-10-CM | POA: Diagnosis not present

## 2022-01-22 DIAGNOSIS — C9140 Hairy cell leukemia not having achieved remission: Secondary | ICD-10-CM | POA: Insufficient documentation

## 2022-01-22 DIAGNOSIS — I2510 Atherosclerotic heart disease of native coronary artery without angina pectoris: Secondary | ICD-10-CM

## 2022-01-22 DIAGNOSIS — C9141 Hairy cell leukemia, in remission: Secondary | ICD-10-CM

## 2022-01-22 DIAGNOSIS — N400 Enlarged prostate without lower urinary tract symptoms: Secondary | ICD-10-CM

## 2022-01-22 LAB — CBC WITH DIFFERENTIAL/PLATELET
Abs Immature Granulocytes: 0.03 10*3/uL (ref 0.00–0.07)
Basophils Absolute: 0 10*3/uL (ref 0.0–0.1)
Basophils Relative: 0 %
Eosinophils Absolute: 0.5 10*3/uL (ref 0.0–0.5)
Eosinophils Relative: 9 %
HCT: 34.8 % — ABNORMAL LOW (ref 39.0–52.0)
Hemoglobin: 11.5 g/dL — ABNORMAL LOW (ref 13.0–17.0)
Immature Granulocytes: 1 %
Lymphocytes Relative: 8 %
Lymphs Abs: 0.5 10*3/uL — ABNORMAL LOW (ref 0.7–4.0)
MCH: 32.5 pg (ref 26.0–34.0)
MCHC: 33 g/dL (ref 30.0–36.0)
MCV: 98.3 fL (ref 80.0–100.0)
Monocytes Absolute: 0.2 10*3/uL (ref 0.1–1.0)
Monocytes Relative: 4 %
Neutro Abs: 4.5 10*3/uL (ref 1.7–7.7)
Neutrophils Relative %: 78 %
Platelets: 158 10*3/uL (ref 150–400)
RBC: 3.54 MIL/uL — ABNORMAL LOW (ref 4.22–5.81)
RDW: 14.2 % (ref 11.5–15.5)
WBC: 5.7 10*3/uL (ref 4.0–10.5)
nRBC: 0 % (ref 0.0–0.2)

## 2022-01-22 LAB — COMPREHENSIVE METABOLIC PANEL
ALT: 8 U/L (ref 0–53)
AST: 16 U/L (ref 0–37)
Albumin: 3.9 g/dL (ref 3.5–5.2)
Alkaline Phosphatase: 71 U/L (ref 39–117)
BUN: 18 mg/dL (ref 6–23)
CO2: 27 mEq/L (ref 19–32)
Calcium: 9 mg/dL (ref 8.4–10.5)
Chloride: 108 mEq/L (ref 96–112)
Creatinine, Ser: 1.65 mg/dL — ABNORMAL HIGH (ref 0.40–1.50)
GFR: 37.14 mL/min — ABNORMAL LOW (ref >60.00)
Glucose, Bld: 86 mg/dL (ref 70–99)
Potassium: 3.8 mEq/L (ref 3.5–5.1)
Sodium: 142 mEq/L (ref 135–145)
Total Bilirubin: 0.7 mg/dL (ref 0.2–1.2)
Total Protein: 6.1 g/dL (ref 6.0–8.3)

## 2022-01-22 LAB — LIPID PANEL
Cholesterol: 138 mg/dL (ref 0–200)
HDL: 56.4 mg/dL (ref >39.00)
LDL Cholesterol: 70 mg/dL (ref 0–99)
NonHDL: 81.75
Total CHOL/HDL Ratio: 2
Triglycerides: 57 mg/dL (ref 0.0–149.0)
VLDL: 11.4 mg/dL (ref 0.0–40.0)

## 2022-01-22 LAB — HEMOGLOBIN A1C: Hgb A1c MFr Bld: 5.4 % (ref 4.6–6.5)

## 2022-01-22 MED ORDER — SERTRALINE HCL 100 MG PO TABS: 150.0000 mg | ORAL_TABLET | Freq: Every day | ORAL | 1 refills | Status: DC

## 2022-01-22 NOTE — Assessment & Plan Note (Signed)
Asymptomatic.  Continue risk factor management.  Continue Imdur 30 mg daily.

## 2022-01-22 NOTE — Progress Notes (Signed)
Frank Rumps, MD Phone: [phone]  Frank Mcdaniel is a 88 y.o. male who presents today for follow-up.  Dementia: Patient's son reports his memory has gotten worse.  His short-term memory is the issue.  He has not had any wandering or unsafe behaviors.  He is on Namenda.  Anxiety: His son reports that the evenings are worse with the anxiety.  The patient asks lots of questions.  He continues on Zoloft and the patient's son is unsure if he is taking this once or twice a day.  He does take hydroxyzine and does not use this every day.  The hydroxyzine has been quite beneficial.  CAD: Patient notes no chest pain or shortness of breath.  BPH: Strain- no Flow- moderately decreased Frequency- yes Urgency- no Nocturia- x3 Emptying bladder- yes Medication- flomax, proscar   Social History   Tobacco Use  Smoking Status Never  Smokeless Tobacco Never    Current Outpatient Medications on File Prior to Visit  Medication Sig Dispense Refill   aspirin EC 81 MG tablet Take 1 tablet (81 mg total) by mouth daily. Swallow whole. 90 tablet 3   atorvastatin (LIPITOR) 40 MG tablet TAKE 1/2 TABLETS (20 MG TOTAL) BY MOUTH DAILY. 45 tablet 3   Calcium Carbonate Antacid (TUMS CHEWY BITES PO) Take 1 tablet by mouth as needed.      famotidine (PEPCID) 20 MG tablet TAKE 1 TABLET BY MOUTH EVERY DAY 90 tablet 3   finasteride (PROSCAR) 5 MG tablet TAKE 1 TABLET BY MOUTH EVERY DAY 90 tablet 3   hydrOXYzine (ATARAX) 10 MG tablet Take 10 mg by mouth 3 (three) times daily as needed.     isosorbide mononitrate (IMDUR) 30 MG 24 hr tablet TAKE 1 TABLET BY MOUTH EVERY DAY 90 tablet 0   memantine (NAMENDA) 5 MG tablet TAKE 1 TABLET BY MOUTH TWICE A DAY 180 tablet 1   Multiple Vitamin (MULTIVITAMIN) tablet Take 1 tablet by mouth daily.     pantoprazole (PROTONIX) 40 MG tablet TAKE 1 TABLET BY MOUTH EVERY DAY 90 tablet 1   tamsulosin (FLOMAX) 0.4 MG CAPS capsule TAKE 2 CAPSULES BY MOUTH EVERY DAY (Patient  taking differently: 0.4 mg. TAKE 2 CAPSULES BY MOUTH EVERY DAY) 180 capsule 1   No current facility-administered medications on file prior to visit.     ROS see history of present illness  Objective  Physical Exam Vitals:   01/22/22 0823  BP: 120/70  Pulse: (!) 55  Temp: 98.7 F (37.1 C)  SpO2: 97%    BP Readings from Last 3 Encounters:  01/22/22 120/70  11/07/21 120/60  11/06/21 (!) 112/52   Wt Readings from Last 3 Encounters:  01/22/22 166 lb 12.8 oz (75.7 kg)  11/07/21 167 lb 9.6 oz (76 kg)  10/23/21 166 lb (75.3 kg)    Physical Exam Constitutional:      General: He is not in acute distress.    Appearance: He is not diaphoretic.  Cardiovascular:     Rate and Rhythm: Normal rate and regular rhythm.     Heart sounds: Normal heart sounds.  Pulmonary:     Effort: Pulmonary effort is normal.     Breath sounds: Normal breath sounds.  Skin:    General: Skin is warm and dry.  Neurological:     Mental Status: He is alert.      Assessment/Plan: Please see individual problem list.  Problem List Items Addressed This Visit     Anxiety (Chronic)  Suboptimally controlled.  The patient should only be taking Zoloft 100 mg daily.  The patient's son will confirm this.  We will increase Zoloft to 150 mg daily.  He can continue hydroxyzine 10 mg 3 times daily as needed for anxiety.  They will contact us with any noted side effects with the increase Zoloft dose.      Relevant Medications   sertraline (ZOLOFT) 100 MG tablet   BPH (benign prostatic hyperplasia) (Chronic)    Generally stable.  Discussed the option of switching the Flomax to something different though the patient's son opted to keep the Flomax at 0.8 mg daily and the proscar at 5 mg daily.  They will monitor symptoms.      Coronary artery disease (Chronic)    Asymptomatic.  Continue risk factor management.  Continue Imdur 30 mg daily.      Relevant Orders   Comp Met (CMET)   Lipid panel   Dementia  without behavioral disturbance (HCC) (Chronic)    Slowly progressing.  He will continue Namenda 5 mg twice daily.  His son will monitor for any wandering or unsafe behaviors.      Relevant Medications   sertraline (ZOLOFT) 100 MG tablet   Other Visit Diagnoses     Elevated glucose    -  Primary   Relevant Orders   HgB A1c         Return in about 3 months (around 04/24/2022).   Frank Rumps, MD [place]

## 2022-01-22 NOTE — Assessment & Plan Note (Addendum)
Suboptimally controlled.  The patient should only be taking Zoloft 100 mg daily.  The patient's son will confirm this.  We will increase Zoloft to 150 mg daily.  He can continue hydroxyzine 10 mg 3 times daily as needed for anxiety.  They will contact us with any noted side effects with the increase Zoloft dose.

## 2022-01-22 NOTE — Assessment & Plan Note (Signed)
Slowly progressing.  He will continue Namenda 5 mg twice daily.  His son will monitor for any wandering or unsafe behaviors.

## 2022-01-22 NOTE — Patient Instructions (Signed)
Nice to see you. We will get lab work today. We will increase the sertraline to 150 mg daily.  If you notice any side effects with this please let us know.

## 2022-01-22 NOTE — Assessment & Plan Note (Addendum)
Generally stable.  Discussed the option of switching the Flomax to something different though the patient's son opted to keep the Flomax at 0.8 mg daily and the proscar at 5 mg daily.  They will monitor symptoms.

## 2022-02-09 NOTE — Progress Notes (Signed)
Cardiology Office Note  Date:  02/11/2022   ID:  Frank Mcdaniel, DOB 1933-07-25, MRN [id number]  PCP:  Glori Luis, MD   Chief Complaint  Patient presents with   Follow-up    12 month follow up. Meds reviewed with son.     HPI:  88 year old gentleman with a history of  coronary artery disease, bypass in July 2000, hairy cell leukemia,   chronic anemia,  hyperlipidemia.  who presents for routine followup of his coronary artery disease.   LOV 10/2020 In follow-up today reports that he feels well Lives with his son  presents with his son, son reports father has memory issues, anxiety Has a sitter that watches him during the daytime Son tries to get him out at least once a day to go somewhere  Blood pressure low today, denies orthostasis symptoms Does not have blood pressure cuff at home  No recent hematologic/oncologic treatments with Dr. Orlie Dakin  Labs reviewed Total chol 138, LDL 70  EKG personally reviewed by myself on todays visit Normal sinus rhythm rate 70 bpm with right bundle branch block, no significant change  Other past medical history reviewed Bone marrow biopsy on November 02, 2019 revealed recurrent hairy cell leukemia, but percentage of involvement was not reported.  Patient received his cladribine from July 03, 2020 through July 07, 2020  He received cycle 2 of weekly Rituxan on July 10, 2020.  Treatment was then delayed several weeks secondary to hospital admission.  In hospital,  PNA 09/2020 completed chemotherapy on 09/05/20 admitted for worsening confusion imdur held for low pressure  Underwent cycle 8 of 8 of weekly Rituxan. 09/05/2020 Followed by Dr. Orlie Dakin, oncology Had high pressure on ercent visit Back on imdur 30 daily  Echo 11/2019   1. Left ventricular ejection fraction, by estimation, is 60 to 65%. The  left ventricle has normal function. The left ventricle has no regional  wall motion abnormalities. Left ventricular  diastolic parameters are  consistent with Grade I diastolic  dysfunction (impaired relaxation).   2. Right ventricular systolic function is normal. The right ventricular  size is normal. Tricuspid regurgitation signal is inadequate for assessing  PA pressure.   3. Frequent ectopy noted.   Other past medical history Denies any shortness of breath admission to the hospital June 2016 for shortness of breath, had a echocardiogram, stress test Showing no ischemia Diagnosed with asthma exacerbation   slipping on the ice in January 2016, possibly landed on his left arm, had chest pressure, described as a dullness in his left pectoral area. He went to the emergency room and evaluation showed negative cardiac enzymes, otherwise normal lab work including CBC, basic metabolic panel, EKG unchanged, chest x-ray normal. He was discharged home with follow-up today Possibly had one or 2 very short episodes of fleeting low-grade almost in the left chest. Again he wonders if it could be musculoskeletal from recent fall   Last stress test 01/2015 was a lexiscan, ejection fraction 65%, no ischemia noted, no EKG changes  PMH:   has a past medical history of Anemia (09/01/2015), Anxiety (09/11/2015), Arthritis, Asthma, Asymptomatic Sinus Bradycardia, Atypical chest pain, BPH (benign prostatic hyperplasia), CKD (chronic kidney disease), stage III (HCC), Closed fracture of tuft of distal phalanx of finger (08/31/2018), Community acquired pneumonia (08/10/2020), Coronary artery disease, Dementia without behavioral disturbance (HCC) (05/28/2019), GERD (gastroesophageal reflux disease), H/O echocardiogram, Hairy cell leukemia (HCC), HOH (hard of hearing), HTN (hypertension), Hyperlipidemia, Hypertension, Leukemia (HCC), Neutropenic fever (HCC) (11/08/2019), Status post  inguinal hernia repair (09/13/2016), and Stricture and stenosis of esophagus.  PSH:    Past Surgical History:  Procedure Laterality Date   CARDIAC CATHETERIZATION  N/A 05/26/2015   Procedure: Left Heart Cath;  Surgeon: Antonieta Iba, MD;  Location: [facility] INVASIVE CV [facility];  Service: Cardiovascular;  Laterality: N/A;   CARDIAC CATHETERIZATION N/A 05/26/2015   Procedure: Coronary/Graft Angiography;  Surgeon: Antonieta Iba, MD;  Location: [facility] CV [facility];  Service: Cardiovascular;  Laterality: N/A;   CATARACT EXTRACTION     CORONARY ARTERY BYPASS GRAFT  2000   ESOPHAGOGASTRODUODENOSCOPY N/A 02/18/2017   Procedure: ESOPHAGOGASTRODUODENOSCOPY (EGD) with removal of food bolus;  Surgeon: Midge Minium, MD;  Location: [facility] ENDOSCOPY;  Service: Endoscopy;  Laterality: N/A;   ESOPHAGOGASTRODUODENOSCOPY (EGD) WITH PROPOFOL N/A 03/11/2017   Procedure: ESOPHAGOGASTRODUODENOSCOPY (EGD) WITH PROPOFOL;  Surgeon: Midge Minium, MD;  Location: [facility] ENDOSCOPY;  Service: Endoscopy;  Laterality: N/A;   ESOPHAGOGASTRODUODENOSCOPY (EGD) WITH PROPOFOL N/A 04/15/2017   Procedure: ESOPHAGOGASTRODUODENOSCOPY (EGD) WITH PROPOFOL;  Surgeon: Midge Minium, MD;  Location: [facility] ENDOSCOPY;  Service: Endoscopy;  Laterality: N/A;   ESOPHAGOGASTRODUODENOSCOPY (EGD) WITH PROPOFOL N/A 09/12/2020   Procedure: ESOPHAGOGASTRODUODENOSCOPY (EGD) WITH PROPOFOL;  Surgeon: Toney Reil, MD;  Location: [facility] ENDOSCOPY;  Service: Gastroenterology;  Laterality: N/A;   EYE SURGERY Left    Cataract Extraction with IOL   INGUINAL HERNIA REPAIR Left 06/24/2016   Procedure: HERNIA REPAIR INGUINAL ADULT;  Surgeon: Earline Mayotte, MD;  Location: [facility] ORS;  Service: General;  Laterality: Left;   TONSILLECTOMY  1942    UPPER GASTROINTESTINAL ENDOSCOPY  3 years ago    with Dilation.     Current Outpatient Medications  Medication Sig Dispense Refill   aspirin EC 81 MG tablet Take 1 tablet (81 mg total) by mouth daily. Swallow whole. 90 tablet 3   atorvastatin (LIPITOR) 40 MG tablet TAKE 1/2 TABLETS (20 MG TOTAL) BY MOUTH DAILY. 45 tablet 3   Calcium Carbonate Antacid (TUMS CHEWY BITES PO) Take 1  tablet by mouth as needed.      famotidine (PEPCID) 20 MG tablet TAKE 1 TABLET BY MOUTH EVERY DAY 90 tablet 3   finasteride (PROSCAR) 5 MG tablet TAKE 1 TABLET BY MOUTH EVERY DAY 90 tablet 3   hydrOXYzine (ATARAX) 10 MG tablet Take 10 mg by mouth 3 (three) times daily as needed.     isosorbide mononitrate (IMDUR) 30 MG 24 hr tablet TAKE 1 TABLET BY MOUTH EVERY DAY 90 tablet 0   memantine (NAMENDA) 5 MG tablet TAKE 1 TABLET BY MOUTH TWICE A DAY 180 tablet 1   Multiple Vitamin (MULTIVITAMIN) tablet Take 1 tablet by mouth daily.     pantoprazole (PROTONIX) 40 MG tablet TAKE 1 TABLET BY MOUTH EVERY DAY 90 tablet 1   sertraline (ZOLOFT) 100 MG tablet Take 1.5 tablets (150 mg total) by mouth daily. 135 tablet 1   tamsulosin (FLOMAX) 0.4 MG CAPS capsule 1 capsule (0.4 mg total) daily.     No current facility-administered medications for this visit.     Allergies:   Patient has no known allergies.   Social History:  The patient  reports that he has never smoked. He has never used smokeless tobacco. He reports that he does not drink alcohol and does not use drugs.   Family History:   family history includes Arthritis in his father; Hypertension in his mother; Other in an other family member.   Review of Systems: Review of Systems  Constitutional: Negative.   HENT:  Negative.    Respiratory: Negative.    Cardiovascular: Negative.   Gastrointestinal: Negative.   Musculoskeletal: Negative.   Neurological: Negative.   Psychiatric/Behavioral: Negative.    All other systems reviewed and are negative.  PHYSICAL EXAM: VS:  BP (!) 95/50   Pulse 70   Wt 166 lb 9.6 oz (75.6 kg)   SpO2 96%   BMI 24.60 kg/m  , BMI Body mass index is 24.6 kg/m. Constitutional:  oriented to person, place, and time. No distress.  HENT:  Head: Grossly normal Eyes:  no discharge. No scleral icterus.  Neck: No JVD, no carotid bruits  Cardiovascular: Regular rate and rhythm, no murmurs appreciated Pulmonary/Chest:  Clear to auscultation bilaterally, no wheezes or rails Abdominal: Soft.  no distension.  no tenderness.  Musculoskeletal: Normal range of motion Neurological:  normal muscle tone. Coordination normal. No atrophy Skin: Skin warm and dry Psychiatric: normal affect, pleasant  Recent Labs: 01/22/2022: ALT 8; BUN 18; Creatinine, Ser 1.65; Hemoglobin 11.5; Platelets 158; Potassium 3.8; Sodium 142    Lipid Panel Lab Results  Component Value Date   CHOL 138 01/22/2022   HDL 56.40 01/22/2022   LDLCALC 70 01/22/2022   TRIG 57.0 01/22/2022     Wt Readings from Last 3 Encounters:  02/11/22 166 lb 9.6 oz (75.6 kg)  01/22/22 166 lb 12.8 oz (75.7 kg)  11/07/21 167 lb 9.6 oz (76 kg)     ASSESSMENT AND PLAN:  Pure hypercholesterolemia Cholesterol is at goal on the current lipid regimen. No changes to the medications were made.  Essential hypertension BP low, will monitor for now Denies orthostasis symptoms If blood pressure continues to run low may need to decrease isosorbide dosing  Coronary artery disease involving native coronary artery of native heart without angina pectoris -  Denies anginal symptoms  Hairy cell leukemia, in remission [facility]) Followed by Dr. Orlie Dakin, Chemotherapy through 2021 into early 2022  S/P CABG (coronary artery bypass graft) Currently with no symptoms of angina. No further workup at this time. Continue current medication regimen.   Total encounter time more than 30 minutes  Greater than 50% was spent in counseling and coordination of care with the patient   Orders Placed This Encounter  Procedures   EKG 12-Lead    Signed, Dossie Arbour, M.D., Ph.D. 02/11/2022  [facility] [place], [place] [phone]

## 2022-02-11 ENCOUNTER — Ambulatory Visit: Payer: PPO | Admitting: Cardiovascular Disease

## 2022-02-11 ENCOUNTER — Encounter: Payer: Self-pay | Admitting: Cardiovascular Disease

## 2022-02-11 VITALS — BP 95/50 | HR 70 | Wt 166.6 lb

## 2022-02-11 DIAGNOSIS — Z951 Presence of aortocoronary bypass graft: Secondary | ICD-10-CM

## 2022-02-11 DIAGNOSIS — E782 Mixed hyperlipidemia: Secondary | ICD-10-CM | POA: Diagnosis not present

## 2022-02-11 DIAGNOSIS — N400 Enlarged prostate without lower urinary tract symptoms: Secondary | ICD-10-CM

## 2022-02-11 DIAGNOSIS — I10 Essential (primary) hypertension: Secondary | ICD-10-CM | POA: Diagnosis not present

## 2022-02-11 DIAGNOSIS — E876 Hypokalemia: Secondary | ICD-10-CM | POA: Diagnosis not present

## 2022-02-11 DIAGNOSIS — I25118 Atherosclerotic heart disease of native coronary artery with other forms of angina pectoris: Secondary | ICD-10-CM

## 2022-02-11 DIAGNOSIS — R000 Tachycardia, unspecified: Secondary | ICD-10-CM

## 2022-02-11 MED ORDER — ISOSORBIDE MONONITRATE ER 30 MG PO TB24: 30.0000 mg | ORAL_TABLET | Freq: Every day | ORAL | 3 refills | Status: DC

## 2022-02-11 NOTE — Patient Instructions (Addendum)
Monitor blood pressure, If low, we may need to cut imdur in 1/2    Medication Instructions:  No changes  If you need a refill on your cardiac medications before your next appointment, please call your pharmacy.   Lab work: No new labs needed  Testing/Procedures: No new testing needed  Follow-Up: At [facility], you and your health needs are our priority.  As part of our continuing mission to provide you with exceptional heart care, we have created designated Provider Care Teams.  These Care Teams include your primary Cardiologist (physician) and Advanced Practice Providers (APPs -  Physician Assistants and Nurse Practitioners) who all work together to provide you with the care you need, when you need it.  You will need a follow up appointment in 12 months  Providers on your designated Care Team:   Murray Hodgkins, NP Christell Faith, PA-C Cadence Kathlen Mody, [place]  COVID-19 Vaccine Information can be found at: [email or web address] For questions related to vaccine distribution or appointments, please email [email or web address] or call [phone].

## 2022-02-16 ENCOUNTER — Encounter: Payer: Self-pay | Admitting: Internal Medicine

## 2022-02-16 ENCOUNTER — Emergency Department: Payer: PPO

## 2022-02-16 ENCOUNTER — Observation Stay
Admission: EM | Admit: 2022-02-16 | Discharge: 2022-02-17 | Disposition: A | Discharge status: Home / Self Care | Payer: PPO | Attending: Internal Medicine | Admitting: Internal Medicine

## 2022-02-16 ENCOUNTER — Other Ambulatory Visit: Payer: Self-pay

## 2022-02-16 DIAGNOSIS — J168 Pneumonia due to other specified infectious organisms: Secondary | ICD-10-CM | POA: Diagnosis not present

## 2022-02-16 DIAGNOSIS — Z7982 Long term (current) use of aspirin: Secondary | ICD-10-CM | POA: Diagnosis not present

## 2022-02-16 DIAGNOSIS — J45909 Unspecified asthma, uncomplicated: Secondary | ICD-10-CM | POA: Insufficient documentation

## 2022-02-16 DIAGNOSIS — F0390 Unspecified dementia without behavioral disturbance: Secondary | ICD-10-CM | POA: Diagnosis present

## 2022-02-16 DIAGNOSIS — R0689 Other abnormalities of breathing: Secondary | ICD-10-CM | POA: Diagnosis not present

## 2022-02-16 DIAGNOSIS — N183 Chronic kidney disease, stage 3 unspecified: Secondary | ICD-10-CM | POA: Diagnosis present

## 2022-02-16 DIAGNOSIS — R918 Other nonspecific abnormal finding of lung field: Secondary | ICD-10-CM | POA: Diagnosis not present

## 2022-02-16 DIAGNOSIS — R0603 Acute respiratory distress: Secondary | ICD-10-CM | POA: Diagnosis not present

## 2022-02-16 DIAGNOSIS — R0902 Hypoxemia: Secondary | ICD-10-CM | POA: Diagnosis not present

## 2022-02-16 DIAGNOSIS — E663 Overweight: Secondary | ICD-10-CM | POA: Diagnosis not present

## 2022-02-16 DIAGNOSIS — R531 Weakness: Secondary | ICD-10-CM | POA: Diagnosis present

## 2022-02-16 DIAGNOSIS — I130 Hypertensive heart and chronic kidney disease with heart failure and stage 1 through stage 4 chronic kidney disease, or unspecified chronic kidney disease: Secondary | ICD-10-CM | POA: Diagnosis not present

## 2022-02-16 DIAGNOSIS — Z951 Presence of aortocoronary bypass graft: Secondary | ICD-10-CM | POA: Insufficient documentation

## 2022-02-16 DIAGNOSIS — J189 Pneumonia, unspecified organism: Secondary | ICD-10-CM | POA: Diagnosis not present

## 2022-02-16 DIAGNOSIS — Z6829 Body mass index (BMI) 29.0-29.9, adult: Secondary | ICD-10-CM | POA: Diagnosis not present

## 2022-02-16 DIAGNOSIS — I2510 Atherosclerotic heart disease of native coronary artery without angina pectoris: Secondary | ICD-10-CM | POA: Diagnosis not present

## 2022-02-16 DIAGNOSIS — N401 Enlarged prostate with lower urinary tract symptoms: Secondary | ICD-10-CM | POA: Diagnosis not present

## 2022-02-16 DIAGNOSIS — I509 Heart failure, unspecified: Secondary | ICD-10-CM | POA: Insufficient documentation

## 2022-02-16 DIAGNOSIS — Z79899 Other long term (current) drug therapy: Secondary | ICD-10-CM | POA: Insufficient documentation

## 2022-02-16 DIAGNOSIS — R1310 Dysphagia, unspecified: Principal | ICD-10-CM | POA: Insufficient documentation

## 2022-02-16 DIAGNOSIS — I10 Essential (primary) hypertension: Secondary | ICD-10-CM | POA: Diagnosis present

## 2022-02-16 DIAGNOSIS — R0789 Other chest pain: Secondary | ICD-10-CM | POA: Diagnosis not present

## 2022-02-16 DIAGNOSIS — R351 Nocturia: Secondary | ICD-10-CM | POA: Diagnosis not present

## 2022-02-16 DIAGNOSIS — F03A0 Unspecified dementia, mild, without behavioral disturbance, psychotic disturbance, mood disturbance, and anxiety: Secondary | ICD-10-CM | POA: Diagnosis not present

## 2022-02-16 DIAGNOSIS — N1831 Chronic kidney disease, stage 3a: Secondary | ICD-10-CM | POA: Insufficient documentation

## 2022-02-16 DIAGNOSIS — Z856 Personal history of leukemia: Secondary | ICD-10-CM | POA: Diagnosis not present

## 2022-02-16 DIAGNOSIS — R231 Pallor: Secondary | ICD-10-CM | POA: Diagnosis not present

## 2022-02-16 DIAGNOSIS — N400 Enlarged prostate without lower urinary tract symptoms: Secondary | ICD-10-CM | POA: Diagnosis present

## 2022-02-16 DIAGNOSIS — R069 Unspecified abnormalities of breathing: Secondary | ICD-10-CM | POA: Diagnosis not present

## 2022-02-16 LAB — CBC WITH DIFFERENTIAL/PLATELET
Abs Immature Granulocytes: 0.02 10*3/uL (ref 0.00–0.07)
Basophils Absolute: 0 10*3/uL (ref 0.0–0.1)
Basophils Relative: 0 %
Eosinophils Absolute: 0.1 10*3/uL (ref 0.0–0.5)
Eosinophils Relative: 1 %
HCT: 33.7 % — ABNORMAL LOW (ref 39.0–52.0)
Hemoglobin: 11 g/dL — ABNORMAL LOW (ref 13.0–17.0)
Immature Granulocytes: 0 %
Lymphocytes Relative: 2 %
Lymphs Abs: 0.2 10*3/uL — ABNORMAL LOW (ref 0.7–4.0)
MCH: 32.2 pg (ref 26.0–34.0)
MCHC: 32.6 g/dL (ref 30.0–36.0)
MCV: 98.5 fL (ref 80.0–100.0)
Monocytes Absolute: 0.3 10*3/uL (ref 0.1–1.0)
Monocytes Relative: 4 %
Neutro Abs: 7.2 10*3/uL (ref 1.7–7.7)
Neutrophils Relative %: 93 %
Platelets: 128 10*3/uL — ABNORMAL LOW (ref 150–400)
RBC: 3.42 MIL/uL — ABNORMAL LOW (ref 4.22–5.81)
RDW: 14.2 % (ref 11.5–15.5)
WBC: 7.8 10*3/uL (ref 4.0–10.5)
nRBC: 0 % (ref 0.0–0.2)

## 2022-02-16 LAB — COMPREHENSIVE METABOLIC PANEL
ALT: 13 U/L (ref 0–44)
AST: 27 U/L (ref 15–41)
Albumin: 3.9 g/dL (ref 3.5–5.0)
Alkaline Phosphatase: 67 U/L (ref 38–126)
Anion gap: 6 (ref 5–15)
BUN: 23 mg/dL (ref 8–23)
CO2: 23 mmol/L (ref 22–32)
Calcium: 9 mg/dL (ref 8.9–10.3)
Chloride: 109 mmol/L (ref 98–111)
Creatinine, Ser: 1.52 mg/dL — ABNORMAL HIGH (ref 0.61–1.24)
GFR, Estimated: 44 mL/min — ABNORMAL LOW (ref >60)
Glucose, Bld: 146 mg/dL — ABNORMAL HIGH (ref 70–99)
Potassium: 3.8 mmol/L (ref 3.5–5.1)
Sodium: 138 mmol/L (ref 135–145)
Total Bilirubin: 0.9 mg/dL (ref 0.3–1.2)
Total Protein: 6.7 g/dL (ref 6.5–8.1)

## 2022-02-16 LAB — URINALYSIS, ROUTINE W REFLEX MICROSCOPIC
Bilirubin Urine: NEGATIVE
Glucose, UA: NEGATIVE mg/dL
Hgb urine dipstick: NEGATIVE
Ketones, ur: NEGATIVE mg/dL
Leukocytes,Ua: NEGATIVE
Nitrite: NEGATIVE
Protein, ur: NEGATIVE mg/dL
Specific Gravity, Urine: 1.015 (ref 1.005–1.030)
pH: 6 (ref 5.0–8.0)

## 2022-02-16 LAB — PROCALCITONIN: Procalcitonin: <0.1 ng/mL

## 2022-02-16 LAB — LACTIC ACID, PLASMA
Lactic Acid, Venous: 1.8 mmol/L (ref 0.5–1.9)
Lactic Acid, Venous: 1.8 mmol/L (ref 0.5–1.9)

## 2022-02-16 LAB — TROPONIN I (HIGH SENSITIVITY)
Troponin I (High Sensitivity): 14 ng/L (ref <18)
Troponin I (High Sensitivity): 16 ng/L (ref <18)

## 2022-02-16 LAB — BRAIN NATRIURETIC PEPTIDE: B Natriuretic Peptide: 102.9 pg/mL — ABNORMAL HIGH (ref 0.0–100.0)

## 2022-02-16 MED ORDER — HYDROXYZINE HCL 10 MG PO TABS
10.0000 mg | ORAL_TABLET | Freq: Three times a day (TID) | ORAL | Status: DC | PRN
  Administered 2022-02-16: 10 mg via ORAL
  Filled 2022-02-16: qty 1

## 2022-02-16 MED ORDER — LACTATED RINGERS IV SOLN: INTRAVENOUS | Status: AC

## 2022-02-16 MED ORDER — SODIUM CHLORIDE 0.9 % IV SOLN
2.0000 g | INTRAVENOUS | Status: DC
  Administered 2022-02-16: 2 g via INTRAVENOUS
  Filled 2022-02-16 (×2): qty 20

## 2022-02-16 MED ORDER — SODIUM CHLORIDE 0.9 % IV SOLN
500.0000 mg | INTRAVENOUS | Status: DC
  Administered 2022-02-16: 500 mg via INTRAVENOUS
  Filled 2022-02-16 (×2): qty 5

## 2022-02-16 MED ORDER — MEMANTINE HCL 5 MG PO TABS
5.0000 mg | ORAL_TABLET | Freq: Two times a day (BID) | ORAL | Status: DC
  Administered 2022-02-16 – 2022-02-17 (×2): 5 mg via ORAL
  Filled 2022-02-16 (×2): qty 1

## 2022-02-16 MED ORDER — METRONIDAZOLE 500 MG/100ML IV SOLN
500.0000 mg | Freq: Once | INTRAVENOUS | Status: AC
  Administered 2022-02-16: 500 mg via INTRAVENOUS
  Filled 2022-02-16: qty 100

## 2022-02-16 MED ORDER — LACTATED RINGERS IV BOLUS (SEPSIS)
500.0000 mL | Freq: Once | INTRAVENOUS | Status: AC
  Administered 2022-02-16: 500 mL via INTRAVENOUS

## 2022-02-16 MED ORDER — SERTRALINE HCL 50 MG PO TABS
150.0000 mg | ORAL_TABLET | Freq: Every day | ORAL | Status: DC
  Administered 2022-02-17: 150 mg via ORAL
  Filled 2022-02-16: qty 3

## 2022-02-16 NOTE — H&P (Signed)
History and Physical    Patient: Frank Mcdaniel LYY:[id number] DOB: 03-06-1933 DOA: 02/16/2022 DOS: the patient was seen and examined on 02/16/2022 PCP: [name], MD  Patient coming from: Home  Chief Complaint:  Chief Complaint  Patient presents with   Weakness   HPI: Patient is an 88 year old male with past medical history of hypertension, BPH, hairy cell leukemia and mild dementia without behavioral disturbance who lives under the care of his son.  Patient's son brought him in when he noted that his father started developing rigors.  In the emergency room, labs were stable although he was noted to be significantly tachypneic, but not hypoxic.  He was placed on oxygen for comfort which did improve his respiratory rate.  A chest x-ray noted left lower lobe infiltrate concerning for pneumonia.  Patient was started on antibiotics and given history of diffuse dysphagia, covered for aspiration.  Hospitalist were called for further evaluation.  Review of Systems: As mentioned in the history of present illness. All other systems reviewed and are negative. Past Medical History:  Diagnosis Date   Anemia 09/01/2015   Anxiety 09/11/2015   Arthritis    Asthma    as a teenager   Asymptomatic Sinus Bradycardia    Atypical chest pain    a. 08/2014   BPH (benign prostatic hyperplasia)    CKD (chronic kidney disease), stage III (HCC)    Closed fracture of tuft of distal phalanx of finger 08/31/2018   Community acquired pneumonia 08/10/2020   Coronary artery disease    a. 2000 s/p CABG;  b. 10/2008 Neg MV, EF 64%; c. cath 05/2015: LM 85% sev cal, pLAD-1 lesion 80%, pLAD-2 lesion 100% chronic, ostLCx 70% sev cal, OM4 90%, ostRCA 70%, mid RCA 90%, LIMA-LAD patent, VG-OM2 patent, VG-OM3 patent, VG-RPDA patent. No AS   Dementia without behavioral disturbance ([place]) 05/28/2019   GERD (gastroesophageal reflux disease)    H/O echocardiogram    a. 10/2005 Echo: nl EF.   Hairy cell leukemia (HCC)    HOH  (hard of hearing)    Bilateral hearing aids   HTN (hypertension)    Hyperlipidemia    Hypertension    Leukemia ([place])    Neutropenic fever ([place]) 11/08/2019   Status post inguinal hernia repair 09/13/2016   Stricture and stenosis of esophagus    Past Surgical History:  Procedure Laterality Date   CARDIAC CATHETERIZATION N/A 05/26/2015   Procedure: Left Heart Cath;  Surgeon: [name], MD;  Location: [place] CV LAB;  Service: Cardiovascular;  Laterality: N/A;   CARDIAC CATHETERIZATION N/A 05/26/2015   Procedure: Coronary/Graft Angiography;  Surgeon: [name], MD;  Location: [place] CV LAB;  Service: Cardiovascular;  Laterality: N/A;   CATARACT EXTRACTION     CORONARY ARTERY BYPASS GRAFT  2000   ESOPHAGOGASTRODUODENOSCOPY N/A 02/18/2017   Procedure: ESOPHAGOGASTRODUODENOSCOPY (EGD) with removal of food bolus;  Surgeon: [name], MD;  Location: [facility] ENDOSCOPY;  Service: Endoscopy;  Laterality: N/A;   ESOPHAGOGASTRODUODENOSCOPY (EGD) WITH PROPOFOL N/A 03/11/2017   Procedure: ESOPHAGOGASTRODUODENOSCOPY (EGD) WITH PROPOFOL;  Surgeon: [name], MD;  Location: [facility] ENDOSCOPY;  Service: Endoscopy;  Laterality: N/A;   ESOPHAGOGASTRODUODENOSCOPY (EGD) WITH PROPOFOL N/A 04/15/2017   Procedure: ESOPHAGOGASTRODUODENOSCOPY (EGD) WITH PROPOFOL;  Surgeon: [name], MD;  Location: [facility] ENDOSCOPY;  Service: Endoscopy;  Laterality: N/A;   ESOPHAGOGASTRODUODENOSCOPY (EGD) WITH PROPOFOL N/A 09/12/2020   Procedure: ESOPHAGOGASTRODUODENOSCOPY (EGD) WITH PROPOFOL;  Surgeon: Lin Landsman, MD;  Location: [facility] ENDOSCOPY;  Service: Gastroenterology;  Laterality: N/A;   EYE SURGERY Left    Cataract Extraction with IOL   INGUINAL HERNIA REPAIR Left 06/24/2016   Procedure: HERNIA REPAIR INGUINAL ADULT;  Surgeon: [name], MD;  Location: [facility] ORS;  Service: General;  Laterality: Left;   TONSILLECTOMY  1942    UPPER GASTROINTESTINAL ENDOSCOPY  3 years ago    with Dilation.     Social History:  reports that he has never smoked. He has never used smokeless tobacco. He reports that he does not drink alcohol and does not use drugs.  No Known Allergies  Family History  Problem Relation Age of Onset   Hypertension Mother    Arthritis Father    Other Other        no premature CAD.   Colon cancer Neg Hx    Prostate cancer Neg Hx    Stomach cancer Neg Hx     Prior to Admission medications   Medication Sig Start Date End Date Taking? Authorizing Provider  aspirin EC 81 MG tablet Take 1 tablet (81 mg total) by mouth daily. Swallow whole. 10/13/20  Yes Gollan, Kathlene November, MD  atorvastatin (LIPITOR) 40 MG tablet TAKE 1/2 TABLETS (20 MG TOTAL) BY MOUTH DAILY. 10/22/21  Yes [name], MD  calcium carbonate (TUMS EX) 750 MG chewable tablet Chew 1 tablet by mouth as needed (heartburn).   Yes [provider]  famotidine (PEPCID) 20 MG tablet TAKE 1 TABLET BY MOUTH EVERY DAY 03/29/21  Yes [name], MD  finasteride (PROSCAR) 5 MG tablet TAKE 1 TABLET BY MOUTH EVERY DAY 03/29/21  Yes [name], MD  hydrOXYzine (ATARAX) 10 MG tablet Take 10 mg by mouth 3 (three) times daily as needed for anxiety.   Yes [provider]  isosorbide mononitrate (IMDUR) 30 MG 24 hr tablet Take 1 tablet (30 mg total) by mouth daily. 02/11/22  Yes [name], MD  memantine (NAMENDA) 5 MG tablet TAKE 1 TABLET BY MOUTH TWICE A DAY 12/07/21  Yes [name], MD  Multiple Vitamin (MULTIVITAMIN) tablet Take 1 tablet by mouth daily.   Yes [provider]  pantoprazole (PROTONIX) 40 MG tablet TAKE 1 TABLET BY MOUTH EVERY DAY Patient taking differently: Take 40 mg by mouth every evening. 10/22/21  Yes [name], MD  sertraline (ZOLOFT) 100 MG tablet Take 1.5 tablets (150 mg total) by mouth daily. 01/22/22  Yes [name], MD  tamsulosin (FLOMAX) 0.4 MG CAPS capsule Take 0.8 mg by mouth every evening.   Yes [name], MD     Physical Exam: Vitals:   02/16/22 1130 02/16/22 1200 02/16/22 1240 02/16/22 1530  BP: (!) 102/37 (!) 110/59 108/70 (!) 115/54  Pulse: 88 83 83 69  Resp:   (!) 25 (!) 23  Temp:      TempSrc:      SpO2: 98% 98% 98% 98%  Weight:      Height:       General: Alert and oriented x2, no acute distress HEENT: Normocephalic and atraumatic, mucous membranes slightly dry Cardiovascular: Regular rate and rhythm, S1-S2 Lungs: Decreased breath sounds bibasilar Abdomen: Soft, nontender, nondistended, positive bowel sounds Extremities: No clubbing or cyanosis, trace pitting edema Skin: No skin breaks, tears or lesions Neuro: No focal deficits Psychiatry: Underlying dementia, but no evidence of acute psychoses, appropriate\  Data Reviewed:  Procalcitonin pending.  GFR 44.  Normal white blood cell count. EKG notes sinus rhythm with right bundle branch  block and left ventricular hypertrophy Troponins unremarkable.  Assessment and Plan: * Pneumonia Awaiting for procalcitonin.  Continue antibiotics covering for aspiration.  Dysphagia In review of records, patient has been placed on a dysphagia diet in the past.  Patient's son reports some difficulty given patient's enjoyment of some foods.  He does report coughing which is likely the cause of this aspiration.  I do think there would be little value in repeating her swallow evaluation since the goal is more for comfort feeds but will provide better education that pneumonia will continue to occur.  Dementia without behavioral disturbance (HCC) At baseline  CKD (chronic kidney disease), stage IIIa Stable although slightly dehydrated.  Giving gentle fluids and recheck labs in the morning  BPH (benign prostatic hyperplasia) Stable continue home medication  HTN (hypertension) Blood pressure stable.  Monitor for hypotension.  Overweight (BMI 25.0-29.9) Meets criteria with BMI greater than 25      Advance Care Planning:   Code Status:  Prior DNR  Consults: None  Family Communication: Updated son by phone  Severity of Illness: The appropriate patient status for this patient is OBSERVATION. Observation status is judged to be reasonable and necessary in order to provide the required intensity of service to ensure the patient's safety. The patient's presenting symptoms, physical exam findings, and initial radiographic and laboratory data in the context of their medical condition is felt to place them at decreased risk for further clinical deterioration. Furthermore, it is anticipated that the patient will be medically stable for discharge from the hospital within 2 midnights of admission.   Author: Annita Brod, MD 02/16/2022 4:04 PM  For on call review [email or web address].

## 2022-02-16 NOTE — Assessment & Plan Note (Addendum)
Stable although slightly dehydrated.  Giving gentle fluids and recheck labs in the morning

## 2022-02-16 NOTE — Assessment & Plan Note (Signed)
At baseline 

## 2022-02-16 NOTE — Assessment & Plan Note (Signed)
Awaiting for procalcitonin.  Continue antibiotics covering for aspiration.

## 2022-02-16 NOTE — Progress Notes (Signed)
CODE SEPSIS - PHARMACY COMMUNICATION  **Broad Spectrum Antibiotics should be administered within 1 hour of Sepsis diagnosis**  Time Code Sepsis Called/Page Received: 1042  Antibiotics Ordered: Ceftriaxone + azithromycin + metronidazole  Time of 1st antibiotic administration: 1111  Additional action taken by pharmacy: N/A  Benita Gutter 02/16/2022  10:43 AM

## 2022-02-16 NOTE — Assessment & Plan Note (Signed)
In review of records, patient has been placed on a dysphagia diet in the past.  Patient's son reports some difficulty given patient's enjoyment of some foods.  He does report coughing which is likely the cause of this aspiration.  I do think there would be little value in repeating her swallow evaluation since the goal is more for comfort feeds but will provide better education that pneumonia will continue to occur.

## 2022-02-16 NOTE — Sepsis Progress Note (Signed)
Elink following code sepsis °

## 2022-02-16 NOTE — Hospital Course (Signed)
Patient is an 88 year old male with past medical history of hypertension, BPH, hairy cell leukemia and mild dementia without behavioral disturbance who lives under the care of his son.  Patient's son brought him in when he noted that his father started developing rigors.  In the emergency room, labs were stable although he was noted to be significantly tachypneic, but not hypoxic.  He was placed on oxygen for comfort which did improve his respiratory rate.  A chest x-ray noted left lower lobe infiltrate concerning for pneumonia.  Patient was started on antibiotics and given history of diffuse dysphagia, covered for aspiration.  Hospitalist were called for further evaluation.

## 2022-02-16 NOTE — Assessment & Plan Note (Signed)
Blood pressure stable.  Monitor for hypotension.

## 2022-02-16 NOTE — Assessment & Plan Note (Signed)
Stable continue home medication

## 2022-02-16 NOTE — ED Triage Notes (Signed)
Pt arrived via EMS from home where he lives with his son. Son called with concerns for increased weakness. Pt has hx of dementia. VS stable

## 2022-02-16 NOTE — Assessment & Plan Note (Signed)
Meets criteria with BMI greater than 25 

## 2022-02-16 NOTE — ED Provider Notes (Signed)
**Note Frank-Identified via Obfuscation** Frank Mcdaniel Provider Note   Event Date/Time   First MD Initiated Contact with Patient 02/16/22 [phone]     (approximate) History  Weakness  HPI Frank Mcdaniel is a 88 y.o. male with a stated past medical history of CHF and recurrent hairy cell leukemia in remission since January 2022 who presents for generalized weakness as well shortness of breath that began this morning.  Patient's son at bedside who is his primary caregiver and provides this history.  Son states patient had an episode of rapid breathing as well as shaking chills that he noticed at approximately 8 AM this morning.  Patient has been complaining of generalized weakness to the sun since that time.  Caregiver states that patient was afebrile   Physical Exam  Triage Vital Signs: ED Triage Vitals  Enc Vitals Group     BP 02/16/22 0932 129/68     Pulse Rate 02/16/22 0932 93     Resp 02/16/22 0932 (!) 25     Temp 02/16/22 0932 100.3 F (37.9 C)     Temp Source 02/16/22 0932 Oral     SpO2 02/16/22 0932 95 %     Weight 02/16/22 0933 160 lb (72.6 kg)     Height 02/16/22 0933 '5\' 7"'$  (1.702 m)     Head Circumference --      Peak Flow --      Pain Score 02/16/22 0933 0     Pain Loc --      Pain Edu? --      Excl. in [place]? --    Most recent vital signs: Vitals:   02/16/22 2151 02/17/22 0537  BP: 136/68 (!) 133/52  Pulse: 61 66  Resp: 16 16  Temp: 98.3 F (36.8 C) 98.2 F (36.8 C)  SpO2: 95% 96%   General: Awake, oriented x4. CV:  Good peripheral perfusion.  Resp:  Increased effort.  Tachypnea.  Rales over left lung field Abd:  No distention.  Other:  Elderly Caucasian male laying in bed in mild respiratory distress ED Results / Procedures / Treatments  Labs (all labs ordered are listed, but only abnormal results are displayed) Labs Reviewed  BLOOD CULTURE ID PANEL (REFLEXED) - BCID2 - Abnormal; Notable for the following components:      Result Value   Staphylococcus species DETECTED (*)     All other components within normal limits  COMPREHENSIVE METABOLIC PANEL - Abnormal; Notable for the following components:   Glucose, Bld 146 (*)    Creatinine, Ser 1.52 (*)    GFR, Estimated 44 (*)    All other components within normal limits  CBC WITH DIFFERENTIAL/PLATELET - Abnormal; Notable for the following components:   RBC 3.42 (*)    Hemoglobin 11.0 (*)    HCT 33.7 (*)    Platelets 128 (*)    Lymphs Abs 0.2 (*)    All other components within normal limits  URINALYSIS, ROUTINE W REFLEX MICROSCOPIC - Abnormal; Notable for the following components:   Color, Urine YELLOW (*)    APPearance CLEAR (*)    All other components within normal limits  BRAIN NATRIURETIC PEPTIDE - Abnormal; Notable for the following components:   B Natriuretic Peptide 102.9 (*)    All other components within normal limits  CULTURE, BLOOD (ROUTINE X 2)  CULTURE, BLOOD (ROUTINE X 2)  RESPIRATORY PANEL BY PCR  LACTIC ACID, PLASMA  LACTIC ACID, PLASMA  PROCALCITONIN  TROPONIN I (HIGH SENSITIVITY)  TROPONIN I (HIGH SENSITIVITY)  EKG ED ECG REPORT I, Frank Mcdaniel, the attending physician, personally viewed and interpreted this ECG. Date: 02/16/2022 EKG Time: 0930 Rate: 94 Rhythm: normal sinus rhythm QRS Axis: normal Intervals: Right bundle branch block ST/T Wave abnormalities: normal Narrative Interpretation: Normal sinus rhythm with right bundle branch block.  No evidence of acute ischemia RADIOLOGY ED MD interpretation: Single view portable chest x-ray interpreted by me shows patchy airspace opacity in the left mid to lower lung suspicious for pneumonia -Agree with radiology assessment Official radiology report(s): DG Chest Port 1 View  Result Date: 02/16/2022 CLINICAL DATA:  SHOB, hx of aspiration EXAM: PORTABLE CHEST 1 VIEW COMPARISON:  11/06/2021 FINDINGS: Stable cardiomediastinal contours status post sternotomy and CABG. Coarsened interstitial markings bilaterally with more patchy  airspace opacity in the left mid to lower lung. No pleural effusion or pneumothorax. IMPRESSION: Patchy airspace opacity in the left mid to lower lung, suspicious for pneumonia. Electronically Signed   By: Davina Poke D.O.   On: 02/16/2022 10:36   PROCEDURES: Critical Care performed: Yes, see critical care procedure note(s) .1-3 Lead EKG Interpretation  Performed by: Frank Plummer, MD Authorized by: Frank Plummer, MD     Interpretation: normal     ECG rate:  79   ECG rate assessment: normal     Rhythm: sinus rhythm     Ectopy: none     Conduction: normal   CRITICAL CARE Performed by: Frank Mcdaniel  Total critical care time: 31 minutes  Critical care time was exclusive of separately billable procedures and treating other patients.  Critical care was necessary to treat or prevent imminent or life-threatening deterioration.  Critical care was time spent personally by me on the following activities: development of treatment plan with patient and/or surrogate as well as nursing, discussions with consultants, evaluation of patient's response to treatment, examination of patient, obtaining history from patient or surrogate, ordering and performing treatments and interventions, ordering and review of laboratory studies, ordering and review of radiographic studies, pulse oximetry and re-evaluation of patient's condition.  MEDICATIONS ORDERED IN ED: Medications  lactated ringers infusion ( Intravenous New Bag/Given 02/16/22 1107)  cefTRIAXone (ROCEPHIN) 2 g in sodium chloride 0.9 % 100 mL IVPB (2 g Intravenous Not Given 02/16/22 2020)  azithromycin (ZITHROMAX) 500 mg in sodium chloride 0.9 % 250 mL IVPB (500 mg Intravenous Not Given 02/16/22 2020)  hydrOXYzine (ATARAX) tablet 10 mg (10 mg Oral Given 02/16/22 1806)  sertraline (ZOLOFT) tablet 150 mg (has no administration in time range)  memantine (NAMENDA) tablet 5 mg (5 mg Oral Given 02/16/22 2149)  lactated ringers bolus 500 mL (0 mLs  Intravenous Stopped 02/16/22 1206)  metroNIDAZOLE (FLAGYL) IVPB 500 mg (0 mg Intravenous Stopped 02/16/22 1236)   IMPRESSION / MDM / ASSESSMENT AND PLAN / ED COURSE  I reviewed the triage vital signs and the nursing notes.                             The patient is on the cardiac monitor to evaluate for evidence of arrhythmia and/or significant heart rate changes. Patient's presentation is most consistent with acute presentation with potential threat to life or bodily function.  Presents with shortness of breath, cough, and malaise concerning for pneumonia.  DDx: PE, COPD exacerbation, Pneumothorax, TB, Atypical ACS, Esophageal Rupture, Toxic Exposure, Foreign Body Airway Obstruction.  Workup: CXR CBC, CMP, lactate, troponin  Given History, Exam, and Workup presentation most consistent with pneumonia.  Findings: Left-sided pneumonia on chest x-ray  Tx: Ceftriaxone 1g IV Azithromycin '500mg'$  IV  1131 Reassessment: As patient is continuing to require supplemental oxygenation for continued tachypnea and respiratory distress, patient will require admission to the internal medicine service for further evaluation and management  Disposition: Admit   FINAL CLINICAL IMPRESSION(S) / ED DIAGNOSES   Final diagnoses:  Pneumonia of left lower lobe due to infectious organism  Respiratory distress   Rx / DC Orders   ED Discharge Orders     None      Note:  This document was prepared using Dragon voice recognition software and may include unintentional dictation errors.   Frank Plummer, MD 02/17/22 [phone]

## 2022-02-17 DIAGNOSIS — J189 Pneumonia, unspecified organism: Secondary | ICD-10-CM | POA: Diagnosis not present

## 2022-02-17 DIAGNOSIS — R1310 Dysphagia, unspecified: Secondary | ICD-10-CM | POA: Diagnosis not present

## 2022-02-17 DIAGNOSIS — R351 Nocturia: Secondary | ICD-10-CM | POA: Diagnosis not present

## 2022-02-17 DIAGNOSIS — F0390 Unspecified dementia without behavioral disturbance: Secondary | ICD-10-CM | POA: Diagnosis not present

## 2022-02-17 DIAGNOSIS — N401 Enlarged prostate with lower urinary tract symptoms: Secondary | ICD-10-CM | POA: Diagnosis not present

## 2022-02-17 DIAGNOSIS — N1831 Chronic kidney disease, stage 3a: Secondary | ICD-10-CM | POA: Diagnosis not present

## 2022-02-17 LAB — BASIC METABOLIC PANEL
Anion gap: 5 (ref 5–15)
BUN: 20 mg/dL (ref 8–23)
CO2: 25 mmol/L (ref 22–32)
Calcium: 8.9 mg/dL (ref 8.9–10.3)
Chloride: 110 mmol/L (ref 98–111)
Creatinine, Ser: 1.39 mg/dL — ABNORMAL HIGH (ref 0.61–1.24)
GFR, Estimated: 49 mL/min — ABNORMAL LOW (ref >60)
Glucose, Bld: 95 mg/dL (ref 70–99)
Potassium: 3.8 mmol/L (ref 3.5–5.1)
Sodium: 140 mmol/L (ref 135–145)

## 2022-02-17 LAB — BLOOD CULTURE ID PANEL (REFLEXED) - BCID2

## 2022-02-17 LAB — CBC
HCT: 31 % — ABNORMAL LOW (ref 39.0–52.0)
Hemoglobin: 10.4 g/dL — ABNORMAL LOW (ref 13.0–17.0)
MCH: 32.7 pg (ref 26.0–34.0)
MCHC: 33.5 g/dL (ref 30.0–36.0)
MCV: 97.5 fL (ref 80.0–100.0)
Platelets: 113 10*3/uL — ABNORMAL LOW (ref 150–400)
RBC: 3.18 MIL/uL — ABNORMAL LOW (ref 4.22–5.81)
RDW: 14 % (ref 11.5–15.5)
WBC: 6.7 10*3/uL (ref 4.0–10.5)
nRBC: 0 % (ref 0.0–0.2)

## 2022-02-17 MED ORDER — ONDANSETRON HCL 4 MG/2ML IJ SOLN: 4.0000 mg | Freq: Four times a day (QID) | INTRAMUSCULAR | Status: DC | PRN

## 2022-02-17 MED ORDER — ENOXAPARIN SODIUM 40 MG/0.4ML IJ SOSY
40.0000 mg | PREFILLED_SYRINGE | INTRAMUSCULAR | Status: DC
Start: 2022-02-17 — End: 2022-02-17

## 2022-02-17 MED ORDER — ONDANSETRON HCL 4 MG PO TABS: 4.0000 mg | ORAL_TABLET | Freq: Four times a day (QID) | ORAL | Status: DC | PRN

## 2022-02-17 MED ORDER — SODIUM CHLORIDE 0.9 % IV SOLN: INTRAVENOUS | Status: DC

## 2022-02-17 MED ORDER — ACETAMINOPHEN 650 MG RE SUPP: 650.0000 mg | Freq: Four times a day (QID) | RECTAL | Status: DC | PRN

## 2022-02-17 MED ORDER — ACETAMINOPHEN 325 MG PO TABS: 650.0000 mg | ORAL_TABLET | Freq: Four times a day (QID) | ORAL | Status: DC | PRN

## 2022-02-17 NOTE — Progress Notes (Signed)
   02/16/22 2000  Mobility  HOB Elevated/Bed Position Self regulated  Activity Ambulated with assistance in room;Ambulated with assistance to bathroom  Range of Motion/Exercises Active;All extremities  Level of Assistance Standby assist, set-up cues, supervision of patient - no hands on  Assistive Device None  Distance Ambulated (ft) 20 ft  Activity Response Tolerated well

## 2022-02-17 NOTE — Discharge Summary (Signed)
Physician Discharge Summary   Patient: Frank Mcdaniel MRN: [id number] DOB: Dec 15, 1932  Admit date:     02/16/2022  Discharge date: 02/17/22  Discharge Physician: Annita Brod   PCP: [name], MD   Recommendations at discharge:   Patient will follow-up with his PCP  Discharge Diagnoses: Active Problems:   Dysphagia   Dementia without behavioral disturbance (HCC)   CKD (chronic kidney disease), stage IIIa   BPH (benign prostatic hyperplasia)   HTN (hypertension)   Overweight (BMI 25.0-29.9)  Resolved Problems:   * No resolved hospital problems. [facility] Course: Patient is an 88 year old male with past medical history of hypertension, BPH, hairy cell leukemia and mild dementia without behavioral disturbance who lives under the care of his son.  Patient's son brought him in when he noted that his father started developing rigors.  In the emergency room, labs were stable although he was noted to be significantly tachypneic, but not hypoxic.  Also noted to have mild acute kidney injury.  He was placed on oxygen for comfort which did improve his respiratory rate.  A chest x-ray noted left lower lobe infiltrate concerning for pneumonia.  Patient was started on antibiotics and given history of diffuse dysphagia, covered for aspiration.  Patient was brought in overnight for evaluation.  Procalcitonin by following day was normal and antibiotics were discontinued.  Vital signs were stable and patient was discharged home.  Assessment and Plan: Dysphagia In review of records, patient has been placed on a dysphagia diet in the past.  Patient's son reports some difficulty given patient's enjoyment of some foods.  He does report coughing which is likely the cause of this aspiration.  I do think there would be little value in repeating her swallow evaluation since the goal is more for comfort feeds but will provide better education that patient will always be at risk for aspiration  pneumonia.   Once procalcitonin came back normal, discontinued antibiotics.  Given that his vitals are stable, no need for antibiotics per do not think that he has pneumonia and rather he likely may have had a near aspiration event that caused him to become tachypneic.  Dementia without behavioral disturbance (HCC) At baseline  CKD (chronic kidney disease), stage IIIa Stable although slightly dehydrated.  Give gentle fluids and creatinine improving 1.52-1.39 by discharge  BPH (benign prostatic hyperplasia) Stable continue home medication  HTN (hypertension) Blood pressure stable.  Monitor for hypotension.  Overweight (BMI 25.0-29.9) Meets criteria with BMI greater than 25         Consultants: None Procedures performed: None Disposition: Home Diet recommendation: Dysphagia diet recommended although patient's son does not endorse compliance due to comfort Discharge Diet Orders (From admission, onward)     Start     Ordered   02/17/22 0000  Diet - low sodium heart healthy        02/17/22 0857            DISCHARGE MEDICATION: Allergies as of 02/17/2022   No Known Allergies      Medication List     TAKE these medications    aspirin EC 81 MG tablet Take 1 tablet (81 mg total) by mouth daily. Swallow whole.   atorvastatin 40 MG tablet Commonly known as: LIPITOR TAKE 1/2 TABLETS (20 MG TOTAL) BY MOUTH DAILY.   calcium carbonate 750 MG chewable tablet Commonly known as: TUMS EX Chew 1 tablet by mouth as needed (heartburn).   famotidine 20 MG tablet Commonly  known as: PEPCID TAKE 1 TABLET BY MOUTH EVERY DAY   finasteride 5 MG tablet Commonly known as: PROSCAR TAKE 1 TABLET BY MOUTH EVERY DAY   hydrOXYzine 10 MG tablet Commonly known as: ATARAX Take 10 mg by mouth 3 (three) times daily as needed for anxiety.   isosorbide mononitrate 30 MG 24 hr tablet Commonly known as: IMDUR Take 1 tablet (30 mg total) by mouth daily.   memantine 5 MG  tablet Commonly known as: NAMENDA TAKE 1 TABLET BY MOUTH TWICE A DAY   multivitamin tablet Take 1 tablet by mouth daily.   pantoprazole 40 MG tablet Commonly known as: PROTONIX TAKE 1 TABLET BY MOUTH EVERY DAY What changed: when to take this   sertraline 100 MG tablet Commonly known as: Zoloft Take 1.5 tablets (150 mg total) by mouth daily.   tamsulosin 0.4 MG Caps capsule Commonly known as: FLOMAX Take 0.8 mg by mouth every evening.        Discharge Exam: Filed Weights   02/16/22 0933  Weight: 72.6 kg   General: Alert and oriented x2, no acute distress Cardiovascular: Regular rate and rhythm, S1-S2 Lungs: Clear to auscultation bilaterally  Condition at discharge: good  The results of significant diagnostics from this hospitalization (including imaging, microbiology, ancillary and laboratory) are listed below for reference.   Imaging Studies: DG Chest Port 1 View  Result Date: 02/16/2022 CLINICAL DATA:  SHOB, hx of aspiration EXAM: PORTABLE CHEST 1 VIEW COMPARISON:  11/06/2021 FINDINGS: Stable cardiomediastinal contours status post sternotomy and CABG. Coarsened interstitial markings bilaterally with more patchy airspace opacity in the left mid to lower lung. No pleural effusion or pneumothorax. IMPRESSION: Patchy airspace opacity in the left mid to lower lung, suspicious for pneumonia. Electronically Signed   By: Davina Poke D.O.   On: 02/16/2022 10:36    Microbiology: Results for orders placed or performed during the hospital encounter of 02/16/22  Blood culture (routine x 2)     Status: None (Preliminary result)   Collection Time: 02/16/22 10:59 AM   Specimen: BLOOD  Result Value Ref Range Status   Specimen Description BLOOD BLOOD LEFT FOREARM  Final   Special Requests   Final    BOTTLES DRAWN AEROBIC AND ANAEROBIC Blood Culture adequate volume   Culture   Final    NO GROWTH < 24 HOURS Performed at [facility], [address] [place],  [place] [address]    Report Status PENDING  Incomplete  Blood culture (routine x 2)     Status: None (Preliminary result)   Collection Time: 02/16/22 10:59 AM   Specimen: BLOOD  Result Value Ref Range Status   Specimen Description   Final    BLOOD RIGHT ANTECUBITAL Performed at [facility], [address], [place], [place] [address]    Special Requests   Final    BOTTLES DRAWN AEROBIC AND ANAEROBIC Blood Culture adequate volume Performed at [facility], [address] [place], [place] [address]    Culture  Setup Time   Final    GRAM POSITIVE COCCI AEROBIC BOTTLE ONLY GRAM POSITIVE COCCI GRAM POSITIVE RODS ANAEROBIC BOTTLE ONLY CRITICAL RESULT CALLED TO, READ BACK BY AND VERIFIED WITHLloyd Huger @ 1856 02/17/22 LFD Performed at [facility], [place] [address], [place],  [address]    Culture Lonell Grandchild POSITIVE RODS [facility] POSITIVE COCCI   Final   Report Status PENDING  Incomplete  Blood Culture ID Panel (Reflexed)     Status: Abnormal  Collection Time: 02/16/22 10:59 AM  Result Value Ref Range Status   Enterococcus faecalis NOT DETECTED NOT DETECTED Final   Enterococcus Faecium NOT DETECTED NOT DETECTED Final   Listeria monocytogenes NOT DETECTED NOT DETECTED Final   Staphylococcus species DETECTED (A) NOT DETECTED Final    Comment: CRITICAL RESULT CALLED TO, READ BACK BY AND VERIFIED WITH: NATHAN BELUE @ 0102 02/17/22 LFD    Staphylococcus aureus (BCID) NOT DETECTED NOT DETECTED Final   Staphylococcus epidermidis NOT DETECTED NOT DETECTED Final   Staphylococcus lugdunensis NOT DETECTED NOT DETECTED Final   Streptococcus species NOT DETECTED NOT DETECTED Final   Streptococcus agalactiae NOT DETECTED NOT DETECTED Final   Streptococcus pneumoniae NOT DETECTED NOT DETECTED Final   Streptococcus pyogenes NOT DETECTED NOT DETECTED Final   A.calcoaceticus-baumannii NOT DETECTED NOT DETECTED Final   Bacteroides fragilis NOT DETECTED NOT DETECTED Final    Enterobacterales NOT DETECTED NOT DETECTED Final   Enterobacter cloacae complex NOT DETECTED NOT DETECTED Final   Escherichia coli NOT DETECTED NOT DETECTED Final   Klebsiella aerogenes NOT DETECTED NOT DETECTED Final   Klebsiella oxytoca NOT DETECTED NOT DETECTED Final   Klebsiella pneumoniae NOT DETECTED NOT DETECTED Final   Proteus species NOT DETECTED NOT DETECTED Final   Salmonella species NOT DETECTED NOT DETECTED Final   Serratia marcescens NOT DETECTED NOT DETECTED Final   Haemophilus influenzae NOT DETECTED NOT DETECTED Final   Neisseria meningitidis NOT DETECTED NOT DETECTED Final   Pseudomonas aeruginosa NOT DETECTED NOT DETECTED Final   Stenotrophomonas maltophilia NOT DETECTED NOT DETECTED Final   Candida albicans NOT DETECTED NOT DETECTED Final   Candida auris NOT DETECTED NOT DETECTED Final   Candida glabrata NOT DETECTED NOT DETECTED Final   Candida krusei NOT DETECTED NOT DETECTED Final   Candida parapsilosis NOT DETECTED NOT DETECTED Final   Candida tropicalis NOT DETECTED NOT DETECTED Final   Cryptococcus neoformans/gattii NOT DETECTED NOT DETECTED Final    Comment: Performed at [facility], [address] [place], [place] [address]    Labs: CBC: Recent Labs  Lab 02/16/22 1000 02/17/22 0817  WBC 7.8 6.7  NEUTROABS 7.2  --   HGB 11.0* 10.4*  HCT 33.7* 31.0*  MCV 98.5 97.5  PLT 128* 644*   Basic Metabolic Panel: Recent Labs  Lab 02/16/22 1000 02/17/22 0817  NA 138 140  K 3.8 3.8  CL 109 110  CO2 23 25  GLUCOSE 146* 95  BUN 23 20  CREATININE 1.52* 1.39*  CALCIUM 9.0 8.9   Liver Function Tests: Recent Labs  Lab 02/16/22 1000  AST 27  ALT 13  ALKPHOS 67  BILITOT 0.9  PROT 6.7  ALBUMIN 3.9   CBG: No results for input(s): "GLUCAP" in the last 168 hours.  Discharge time spent: less than 30 minutes.  Signed: Annita Brod, MD [facility] 02/17/2022

## 2022-02-17 NOTE — Plan of Care (Signed)

## 2022-02-17 NOTE — Progress Notes (Signed)
PHARMACY - PHYSICIAN COMMUNICATION CRITICAL VALUE ALERT - BLOOD CULTURE IDENTIFICATION (BCID)  Results for orders placed or performed during the hospital encounter of 11/08/19  Blood Culture ID Panel (Reflexed) (Collected: 11/08/2019  4:43 PM)  Result Value Ref Range   Enterococcus species NOT DETECTED NOT DETECTED   Listeria monocytogenes NOT DETECTED NOT DETECTED   Staphylococcus species DETECTED (A) NOT DETECTED   Staphylococcus aureus (BCID) NOT DETECTED NOT DETECTED   Methicillin resistance NOT DETECTED NOT DETECTED   Streptococcus species NOT DETECTED NOT DETECTED   Streptococcus agalactiae NOT DETECTED NOT DETECTED   Streptococcus pneumoniae NOT DETECTED NOT DETECTED   Streptococcus pyogenes NOT DETECTED NOT DETECTED   Acinetobacter baumannii NOT DETECTED NOT DETECTED   Enterobacteriaceae species NOT DETECTED NOT DETECTED   Enterobacter cloacae complex NOT DETECTED NOT DETECTED   Escherichia coli NOT DETECTED NOT DETECTED   Klebsiella oxytoca NOT DETECTED NOT DETECTED   Klebsiella pneumoniae NOT DETECTED NOT DETECTED   Proteus species NOT DETECTED NOT DETECTED   Serratia marcescens NOT DETECTED NOT DETECTED   Haemophilus influenzae NOT DETECTED NOT DETECTED   Neisseria meningitidis NOT DETECTED NOT DETECTED   Pseudomonas aeruginosa NOT DETECTED NOT DETECTED   Candida albicans NOT DETECTED NOT DETECTED   Candida glabrata NOT DETECTED NOT DETECTED   Candida krusei NOT DETECTED NOT DETECTED   Candida parapsilosis NOT DETECTED NOT DETECTED   Candida tropicalis NOT DETECTED NOT DETECTED   BCID results: 1 anaerobic bottle w/ GPCs & GPRs. 1 aerobic bottle w/ Staph Species, no resistance. No MRSA PCR ordered. WBC WNL, pt afebrile.  Pt currently on Azithromycin & Ceftriaxone.   Name of provider contacted: Argie Ramming, MD  Changes to prescribed antibiotics required: Continue current regimen at this time since pt is afebrile awaiting further characterization  Renda Rolls, PharmD,  [facility] 02/17/2022 4:46 AM

## 2022-02-18 ENCOUNTER — Encounter: Payer: Self-pay | Admitting: Family Medicine

## 2022-02-19 ENCOUNTER — Telehealth: Payer: Self-pay

## 2022-02-19 NOTE — Telephone Encounter (Signed)
Transition Care Management Unsuccessful Follow-up Telephone Call  Date of discharge and from where:  02/17/22 [facility]  Attempts:  1st Attempt  Reason for unsuccessful TCM follow-up call:  Unable to reach patient. Will follow.

## 2022-02-20 DIAGNOSIS — Z515 Encounter for palliative care: Secondary | ICD-10-CM | POA: Diagnosis not present

## 2022-02-20 DIAGNOSIS — F0392 Unspecified dementia, unspecified severity, with psychotic disturbance: Secondary | ICD-10-CM | POA: Diagnosis not present

## 2022-02-20 DIAGNOSIS — F419 Anxiety disorder, unspecified: Secondary | ICD-10-CM | POA: Diagnosis not present

## 2022-02-20 DIAGNOSIS — F03911 Unspecified dementia, unspecified severity, with agitation: Secondary | ICD-10-CM | POA: Diagnosis not present

## 2022-02-20 DIAGNOSIS — Z9183 Wandering in diseases classified elsewhere: Secondary | ICD-10-CM | POA: Diagnosis not present

## 2022-02-20 DIAGNOSIS — F03918 Unspecified dementia, unspecified severity, with other behavioral disturbance: Secondary | ICD-10-CM | POA: Diagnosis not present

## 2022-02-20 DIAGNOSIS — E785 Hyperlipidemia, unspecified: Secondary | ICD-10-CM | POA: Diagnosis not present

## 2022-02-20 LAB — CULTURE, BLOOD (ROUTINE X 2): Special Requests: ADEQUATE

## 2022-02-20 NOTE — Telephone Encounter (Signed)
Spoke with son, HIPAA compliant. Reports patient is at baseline with no need to follow up at this time. Agrees to contact PCP if any worsening symptoms and as needed. No further follow up at this time.

## 2022-02-21 ENCOUNTER — Telehealth: Payer: Self-pay

## 2022-02-21 LAB — CULTURE, BLOOD (ROUTINE X 2)
Culture: NO GROWTH
Special Requests: ADEQUATE

## 2022-02-21 NOTE — Telephone Encounter (Signed)
Pt son Jennifer Holland) is returning call to cma

## 2022-02-21 NOTE — Telephone Encounter (Signed)
LVM to call back to office to view result notes

## 2022-02-25 ENCOUNTER — Other Ambulatory Visit: Payer: Self-pay

## 2022-02-26 NOTE — Telephone Encounter (Signed)
Results given.  ng

## 2022-03-05 ENCOUNTER — Other Ambulatory Visit: Payer: Self-pay

## 2022-03-09 ENCOUNTER — Emergency Department: Payer: PPO

## 2022-03-09 ENCOUNTER — Inpatient Hospital Stay
Admission: EM | Admit: 2022-03-09 | Discharge: 2022-03-11 | DRG: 871 | Disposition: A | Discharge status: Home / Self Care | Payer: PPO | Attending: Internal Medicine | Admitting: Internal Medicine

## 2022-03-09 ENCOUNTER — Encounter: Payer: Self-pay | Admitting: Emergency Medicine

## 2022-03-09 ENCOUNTER — Other Ambulatory Visit: Payer: Self-pay

## 2022-03-09 DIAGNOSIS — E876 Hypokalemia: Secondary | ICD-10-CM | POA: Diagnosis not present

## 2022-03-09 DIAGNOSIS — G9341 Metabolic encephalopathy: Secondary | ICD-10-CM | POA: Diagnosis not present

## 2022-03-09 DIAGNOSIS — Z8261 Family history of arthritis: Secondary | ICD-10-CM | POA: Diagnosis not present

## 2022-03-09 DIAGNOSIS — J45909 Unspecified asthma, uncomplicated: Secondary | ICD-10-CM | POA: Diagnosis present

## 2022-03-09 DIAGNOSIS — R000 Tachycardia, unspecified: Secondary | ICD-10-CM | POA: Diagnosis present

## 2022-03-09 DIAGNOSIS — J690 Pneumonitis due to inhalation of food and vomit: Secondary | ICD-10-CM | POA: Diagnosis present

## 2022-03-09 DIAGNOSIS — R0682 Tachypnea, not elsewhere classified: Secondary | ICD-10-CM | POA: Diagnosis not present

## 2022-03-09 DIAGNOSIS — I129 Hypertensive chronic kidney disease with stage 1 through stage 4 chronic kidney disease, or unspecified chronic kidney disease: Secondary | ICD-10-CM | POA: Diagnosis not present

## 2022-03-09 DIAGNOSIS — R6520 Severe sepsis without septic shock: Secondary | ICD-10-CM | POA: Diagnosis present

## 2022-03-09 DIAGNOSIS — C9141 Hairy cell leukemia, in remission: Secondary | ICD-10-CM | POA: Diagnosis not present

## 2022-03-09 DIAGNOSIS — Z66 Do not resuscitate: Secondary | ICD-10-CM | POA: Diagnosis not present

## 2022-03-09 DIAGNOSIS — A419 Sepsis, unspecified organism: Principal | ICD-10-CM | POA: Diagnosis present

## 2022-03-09 DIAGNOSIS — F0390 Unspecified dementia without behavioral disturbance: Secondary | ICD-10-CM | POA: Diagnosis not present

## 2022-03-09 DIAGNOSIS — G9340 Encephalopathy, unspecified: Secondary | ICD-10-CM | POA: Diagnosis not present

## 2022-03-09 DIAGNOSIS — Z951 Presence of aortocoronary bypass graft: Secondary | ICD-10-CM

## 2022-03-09 DIAGNOSIS — Z974 Presence of external hearing-aid: Secondary | ICD-10-CM

## 2022-03-09 DIAGNOSIS — F419 Anxiety disorder, unspecified: Secondary | ICD-10-CM | POA: Diagnosis present

## 2022-03-09 DIAGNOSIS — I959 Hypotension, unspecified: Secondary | ICD-10-CM | POA: Diagnosis not present

## 2022-03-09 DIAGNOSIS — C9140 Hairy cell leukemia not having achieved remission: Secondary | ICD-10-CM | POA: Diagnosis not present

## 2022-03-09 DIAGNOSIS — N183 Chronic kidney disease, stage 3 unspecified: Secondary | ICD-10-CM | POA: Diagnosis present

## 2022-03-09 DIAGNOSIS — Z79899 Other long term (current) drug therapy: Secondary | ICD-10-CM

## 2022-03-09 DIAGNOSIS — R4182 Altered mental status, unspecified: Secondary | ICD-10-CM | POA: Diagnosis not present

## 2022-03-09 DIAGNOSIS — K219 Gastro-esophageal reflux disease without esophagitis: Secondary | ICD-10-CM | POA: Diagnosis present

## 2022-03-09 DIAGNOSIS — N1831 Chronic kidney disease, stage 3a: Secondary | ICD-10-CM | POA: Diagnosis present

## 2022-03-09 DIAGNOSIS — I2510 Atherosclerotic heart disease of native coronary artery without angina pectoris: Secondary | ICD-10-CM | POA: Diagnosis not present

## 2022-03-09 DIAGNOSIS — Z6828 Body mass index (BMI) 28.0-28.9, adult: Secondary | ICD-10-CM

## 2022-03-09 DIAGNOSIS — R531 Weakness: Secondary | ICD-10-CM | POA: Diagnosis not present

## 2022-03-09 DIAGNOSIS — N400 Enlarged prostate without lower urinary tract symptoms: Secondary | ICD-10-CM | POA: Diagnosis present

## 2022-03-09 DIAGNOSIS — K8020 Calculus of gallbladder without cholecystitis without obstruction: Secondary | ICD-10-CM | POA: Diagnosis not present

## 2022-03-09 DIAGNOSIS — I10 Essential (primary) hypertension: Secondary | ICD-10-CM | POA: Diagnosis not present

## 2022-03-09 DIAGNOSIS — M1990 Unspecified osteoarthritis, unspecified site: Secondary | ICD-10-CM | POA: Diagnosis present

## 2022-03-09 DIAGNOSIS — R0689 Other abnormalities of breathing: Secondary | ICD-10-CM | POA: Diagnosis not present

## 2022-03-09 DIAGNOSIS — Z8249 Family history of ischemic heart disease and other diseases of the circulatory system: Secondary | ICD-10-CM | POA: Diagnosis not present

## 2022-03-09 DIAGNOSIS — J189 Pneumonia, unspecified organism: Secondary | ICD-10-CM | POA: Diagnosis present

## 2022-03-09 DIAGNOSIS — M6281 Muscle weakness (generalized): Secondary | ICD-10-CM | POA: Diagnosis not present

## 2022-03-09 DIAGNOSIS — Z20822 Contact with and (suspected) exposure to covid-19: Secondary | ICD-10-CM | POA: Diagnosis present

## 2022-03-09 DIAGNOSIS — Z8701 Personal history of pneumonia (recurrent): Secondary | ICD-10-CM

## 2022-03-09 DIAGNOSIS — E663 Overweight: Secondary | ICD-10-CM | POA: Diagnosis not present

## 2022-03-09 DIAGNOSIS — R509 Fever, unspecified: Secondary | ICD-10-CM | POA: Diagnosis not present

## 2022-03-09 DIAGNOSIS — E785 Hyperlipidemia, unspecified: Secondary | ICD-10-CM | POA: Diagnosis not present

## 2022-03-09 DIAGNOSIS — K7689 Other specified diseases of liver: Secondary | ICD-10-CM | POA: Diagnosis not present

## 2022-03-09 DIAGNOSIS — R1310 Dysphagia, unspecified: Secondary | ICD-10-CM | POA: Diagnosis not present

## 2022-03-09 HISTORY — DX: Metabolic encephalopathy: G93.41

## 2022-03-09 LAB — CBC WITH DIFFERENTIAL/PLATELET
Abs Immature Granulocytes: 0.03 10*3/uL (ref 0.00–0.07)
Basophils Absolute: 0 10*3/uL (ref 0.0–0.1)
Basophils Relative: 0 %
Eosinophils Absolute: 0.1 10*3/uL (ref 0.0–0.5)
Eosinophils Relative: 1 %
HCT: 31.1 % — ABNORMAL LOW (ref 39.0–52.0)
Hemoglobin: 10.1 g/dL — ABNORMAL LOW (ref 13.0–17.0)
Immature Granulocytes: 0 %
Lymphocytes Relative: 1 %
Lymphs Abs: 0.1 10*3/uL — ABNORMAL LOW (ref 0.7–4.0)
MCH: 32.6 pg (ref 26.0–34.0)
MCHC: 32.5 g/dL (ref 30.0–36.0)
MCV: 100.3 fL — ABNORMAL HIGH (ref 80.0–100.0)
Monocytes Absolute: 0.2 10*3/uL (ref 0.1–1.0)
Monocytes Relative: 3 %
Neutro Abs: 8.2 10*3/uL — ABNORMAL HIGH (ref 1.7–7.7)
Neutrophils Relative %: 95 %
Platelets: 156 10*3/uL (ref 150–400)
RBC: 3.1 MIL/uL — ABNORMAL LOW (ref 4.22–5.81)
RDW: 13.8 % (ref 11.5–15.5)
WBC: 8.7 10*3/uL (ref 4.0–10.5)
nRBC: 0 % (ref 0.0–0.2)

## 2022-03-09 LAB — URINALYSIS, COMPLETE (UACMP) WITH MICROSCOPIC
Bacteria, UA: NONE SEEN
Bilirubin Urine: NEGATIVE
Glucose, UA: NEGATIVE mg/dL
Ketones, ur: NEGATIVE mg/dL
Leukocytes,Ua: NEGATIVE
Nitrite: NEGATIVE
Protein, ur: NEGATIVE mg/dL
RBC / HPF: >50 RBC/hpf — ABNORMAL HIGH (ref 0–5)
Specific Gravity, Urine: 1.014 (ref 1.005–1.030)
Squamous Epithelial / HPF: NONE SEEN (ref 0–5)
pH: 6 (ref 5.0–8.0)

## 2022-03-09 LAB — COMPREHENSIVE METABOLIC PANEL
ALT: 8 U/L (ref 0–44)
AST: 21 U/L (ref 15–41)
Albumin: 3.8 g/dL (ref 3.5–5.0)
Alkaline Phosphatase: 59 U/L (ref 38–126)
Anion gap: 8 (ref 5–15)
BUN: 20 mg/dL (ref 8–23)
CO2: 22 mmol/L (ref 22–32)
Calcium: 9 mg/dL (ref 8.9–10.3)
Chloride: 111 mmol/L (ref 98–111)
Creatinine, Ser: 1.31 mg/dL — ABNORMAL HIGH (ref 0.61–1.24)
GFR, Estimated: 53 mL/min — ABNORMAL LOW (ref >60)
Glucose, Bld: 110 mg/dL — ABNORMAL HIGH (ref 70–99)
Potassium: 3.5 mmol/L (ref 3.5–5.1)
Sodium: 141 mmol/L (ref 135–145)
Total Bilirubin: 0.7 mg/dL (ref 0.3–1.2)
Total Protein: 6.7 g/dL (ref 6.5–8.1)

## 2022-03-09 LAB — PROTIME-INR
INR: 1.1 (ref 0.8–1.2)
Prothrombin Time: 14.3 seconds (ref 11.4–15.2)

## 2022-03-09 LAB — RESP PANEL BY RT-PCR (FLU A&B, COVID) ARPGX2
Influenza A by PCR: NEGATIVE
Influenza B by PCR: NEGATIVE
SARS Coronavirus 2 by RT PCR: NEGATIVE

## 2022-03-09 LAB — LACTIC ACID, PLASMA: Lactic Acid, Venous: 1.1 mmol/L (ref 0.5–1.9)

## 2022-03-09 LAB — APTT: aPTT: 35 seconds (ref 24–36)

## 2022-03-09 MED ORDER — AZITHROMYCIN 250 MG PO TABS
250.0000 mg | ORAL_TABLET | Freq: Every day | ORAL | Status: DC
Start: 2022-03-10 — End: 2022-03-11
  Administered 2022-03-10 – 2022-03-11 (×2): 250 mg via ORAL
  Filled 2022-03-09 (×2): qty 1

## 2022-03-09 MED ORDER — ADULT MULTIVITAMIN W/MINERALS CH
1.0000 | ORAL_TABLET | Freq: Every day | ORAL | Status: DC
  Administered 2022-03-09 – 2022-03-11 (×3): 1 via ORAL
  Filled 2022-03-09 (×3): qty 1

## 2022-03-09 MED ORDER — IOHEXOL 350 MG/ML SOLN
100.0000 mL | Freq: Once | INTRAVENOUS | Status: AC | PRN
  Administered 2022-03-09: 100 mL via INTRAVENOUS

## 2022-03-09 MED ORDER — LACTATED RINGERS IV SOLN: INTRAVENOUS | Status: DC

## 2022-03-09 MED ORDER — VANCOMYCIN HCL IN DEXTROSE 1-5 GM/200ML-% IV SOLN
1000.0000 mg | Freq: Once | INTRAVENOUS | Status: AC
  Administered 2022-03-09: 1000 mg via INTRAVENOUS
  Filled 2022-03-09: qty 200

## 2022-03-09 MED ORDER — SODIUM CHLORIDE 0.9 % IV SOLN
2.0000 g | Freq: Once | INTRAVENOUS | Status: DC
  Filled 2022-03-09: qty 12.5

## 2022-03-09 MED ORDER — ONDANSETRON HCL 4 MG/2ML IJ SOLN: 4.0000 mg | Freq: Four times a day (QID) | INTRAMUSCULAR | Status: DC | PRN

## 2022-03-09 MED ORDER — FINASTERIDE 5 MG PO TABS
5.0000 mg | ORAL_TABLET | Freq: Every day | ORAL | Status: DC
  Administered 2022-03-10 – 2022-03-11 (×2): 5 mg via ORAL
  Filled 2022-03-09 (×2): qty 1

## 2022-03-09 MED ORDER — CALCIUM CARBONATE ANTACID 500 MG PO CHEW: 1.5000 | CHEWABLE_TABLET | ORAL | Status: DC | PRN

## 2022-03-09 MED ORDER — ASPIRIN 81 MG PO TBEC
81.0000 mg | DELAYED_RELEASE_TABLET | Freq: Every day | ORAL | Status: DC
Start: 2022-03-09 — End: 2022-03-11
  Administered 2022-03-09 – 2022-03-11 (×3): 81 mg via ORAL
  Filled 2022-03-09 (×3): qty 1

## 2022-03-09 MED ORDER — HYDROXYZINE HCL 10 MG PO TABS
10.0000 mg | ORAL_TABLET | Freq: Three times a day (TID) | ORAL | Status: DC | PRN
  Administered 2022-03-09 – 2022-03-11 (×4): 10 mg via ORAL
  Filled 2022-03-09 (×7): qty 1

## 2022-03-09 MED ORDER — SODIUM CHLORIDE 0.9 % IV SOLN
500.0000 mg | Freq: Once | INTRAVENOUS | Status: DC
  Filled 2022-03-09: qty 5

## 2022-03-09 MED ORDER — SODIUM CHLORIDE 0.9 % IV SOLN
3.0000 g | Freq: Four times a day (QID) | INTRAVENOUS | Status: DC
  Administered 2022-03-09 – 2022-03-11 (×7): 3 g via INTRAVENOUS
  Filled 2022-03-09 (×2): qty 8
  Filled 2022-03-09 (×4): qty 3
  Filled 2022-03-09 (×4): qty 8

## 2022-03-09 MED ORDER — PANTOPRAZOLE SODIUM 40 MG PO TBEC
40.0000 mg | DELAYED_RELEASE_TABLET | Freq: Every evening | ORAL | Status: DC
  Administered 2022-03-09 – 2022-03-10 (×2): 40 mg via ORAL
  Filled 2022-03-09 (×2): qty 1

## 2022-03-09 MED ORDER — LACTATED RINGERS IV BOLUS
250.0000 mL | Freq: Once | INTRAVENOUS | Status: AC
Start: 2022-03-09 — End: 2022-03-09
  Administered 2022-03-09: 250 mL via INTRAVENOUS

## 2022-03-09 MED ORDER — SERTRALINE HCL 50 MG PO TABS
150.0000 mg | ORAL_TABLET | Freq: Every day | ORAL | Status: DC
  Administered 2022-03-10 – 2022-03-11 (×2): 150 mg via ORAL
  Filled 2022-03-09 (×2): qty 3

## 2022-03-09 MED ORDER — ONDANSETRON HCL 4 MG PO TABS: 4.0000 mg | ORAL_TABLET | Freq: Four times a day (QID) | ORAL | Status: DC | PRN

## 2022-03-09 MED ORDER — ISOSORBIDE MONONITRATE ER 60 MG PO TB24
30.0000 mg | ORAL_TABLET | Freq: Every day | ORAL | Status: DC
  Administered 2022-03-10: 30 mg via ORAL
  Filled 2022-03-09 (×2): qty 1

## 2022-03-09 MED ORDER — MEMANTINE HCL 5 MG PO TABS
5.0000 mg | ORAL_TABLET | Freq: Two times a day (BID) | ORAL | Status: DC
  Administered 2022-03-09 – 2022-03-11 (×4): 5 mg via ORAL
  Filled 2022-03-09 (×4): qty 1

## 2022-03-09 MED ORDER — TAMSULOSIN HCL 0.4 MG PO CAPS
0.8000 mg | ORAL_CAPSULE | Freq: Every evening | ORAL | Status: DC
  Administered 2022-03-09 – 2022-03-10 (×2): 0.8 mg via ORAL
  Filled 2022-03-09 (×2): qty 2

## 2022-03-09 MED ORDER — ACETAMINOPHEN 325 MG PO TABS: 650.0000 mg | ORAL_TABLET | Freq: Four times a day (QID) | ORAL | Status: DC | PRN

## 2022-03-09 MED ORDER — FAMOTIDINE 20 MG PO TABS
20.0000 mg | ORAL_TABLET | Freq: Every day | ORAL | Status: DC
  Administered 2022-03-10 – 2022-03-11 (×2): 20 mg via ORAL
  Filled 2022-03-09 (×2): qty 1

## 2022-03-09 MED ORDER — ATORVASTATIN CALCIUM 20 MG PO TABS
20.0000 mg | ORAL_TABLET | Freq: Every day | ORAL | Status: DC
  Administered 2022-03-10 – 2022-03-11 (×2): 20 mg via ORAL
  Filled 2022-03-09 (×2): qty 1

## 2022-03-09 MED ORDER — ENOXAPARIN SODIUM 40 MG/0.4ML IJ SOSY
40.0000 mg | PREFILLED_SYRINGE | INTRAMUSCULAR | Status: DC
  Administered 2022-03-09 – 2022-03-10 (×2): 40 mg via SUBCUTANEOUS
  Filled 2022-03-09 (×2): qty 0.4

## 2022-03-09 MED ORDER — ENOXAPARIN SODIUM 40 MG/0.4ML IJ SOSY: 40.0000 mg | PREFILLED_SYRINGE | INTRAMUSCULAR | Status: DC

## 2022-03-09 MED ORDER — ACETAMINOPHEN 650 MG RE SUPP: 650.0000 mg | Freq: Four times a day (QID) | RECTAL | Status: DC | PRN

## 2022-03-09 MED ORDER — LACTATED RINGERS IV SOLN
INTRAVENOUS | Status: DC
Start: 2022-03-09 — End: 2022-03-09

## 2022-03-09 NOTE — Sepsis Progress Note (Signed)
Elink following code sepsis °

## 2022-03-09 NOTE — ED Notes (Signed)
RN at bedside due to pt complaining of left arm pain. Pt arm at IV site swollen, red and painful to touch. Antibiotics stopped and MD made aware.

## 2022-03-09 NOTE — Assessment & Plan Note (Signed)
Continue BPH medications

## 2022-03-09 NOTE — Assessment & Plan Note (Addendum)
White blood cell count 7.4, hemoglobin 9.7 and platelet count 132

## 2022-03-09 NOTE — H&P (Addendum)
History and Physical    Patient: Frank Mcdaniel HFW:[id number] DOB: 1932-09-27 DOA: 03/09/2022 DOS: the patient was seen and examined on 03/09/2022 PCP: [name], MD  Patient coming from: Home  Chief Complaint:  Chief Complaint  Patient presents with   Weakness   Altered Mental Status   HPI: Frank Mcdaniel is a 88 y.o. male with medical history significant of dementia, BPH, hairy cell leukemia, GERD, hyperlipidemia, CAD.  Patient is not the best historian and son at the bedside able to give history.  The patient got up today and had a long time to get ready.  He was not right.  He was unable to get off the toilet today.  He could not speak in full sentences.  Had a temperature of 10 1-1 02 at home.  He has a history of dysphagia and he has a chronic cough.  He was brought in and found to have pneumonia on CT scan.  He had a fever above 101 was tachypneic and tachycardic and hospitalist services contacted for sepsis admission. Review of Systems: Review of Systems  Constitutional:  Positive for fever.  HENT:  Positive for hearing loss. Negative for congestion and sore throat.   Eyes:  Negative for blurred vision.  Respiratory:  Positive for cough and shortness of breath.   Cardiovascular:  Negative for chest pain.  Gastrointestinal:  Negative for abdominal pain, blood in stool, nausea and vomiting.  Genitourinary:  Negative for dysuria and hematuria.  Musculoskeletal:  Negative for myalgias.  Skin:  Negative for itching and rash.  Neurological:  Negative for dizziness.  Endo/Heme/Allergies:  Does not bruise/bleed easily.  Psychiatric/Behavioral:  The patient is nervous/anxious.     Past Medical History:  Diagnosis Date   Anemia 09/01/2015   Anxiety 09/11/2015   Arthritis    Asthma    as a teenager   Asymptomatic Sinus Bradycardia    Atypical chest pain    a. 08/2014   BPH (benign prostatic hyperplasia)    CKD (chronic kidney disease), stage III (HCC)    Closed fracture  of tuft of distal phalanx of finger 08/31/2018   Community acquired pneumonia 08/10/2020   Coronary artery disease    a. 2000 s/p CABG;  b. 10/2008 Neg MV, EF 64%; c. cath 05/2015: LM 85% sev cal, pLAD-1 lesion 80%, pLAD-2 lesion 100% chronic, ostLCx 70% sev cal, OM4 90%, ostRCA 70%, mid RCA 90%, LIMA-LAD patent, VG-OM2 patent, VG-OM3 patent, VG-RPDA patent. No AS   Dementia without behavioral disturbance ([place]) 05/28/2019   GERD (gastroesophageal reflux disease)    H/O echocardiogram    a. 10/2005 Echo: nl EF.   Hairy cell leukemia (HCC)    HOH (hard of hearing)    Bilateral hearing aids   HTN (hypertension)    Hyperlipidemia    Hypertension    Leukemia ([place])    Neutropenic fever ([place]) 11/08/2019   Status post inguinal hernia repair 09/13/2016   Stricture and stenosis of esophagus    Past Surgical History:  Procedure Laterality Date   CARDIAC CATHETERIZATION N/A 05/26/2015   Procedure: Left Heart Cath;  Surgeon: [name], MD;  Location: [place] [facility];  Service: Cardiovascular;  Laterality: N/A;   CARDIAC CATHETERIZATION N/A 05/26/2015   Procedure: Coronary/Graft Angiography;  Surgeon: [name], MD;  Location: [place] [facility];  Service: Cardiovascular;  Laterality: N/A;   CATARACT EXTRACTION     CORONARY ARTERY BYPASS GRAFT  2000   ESOPHAGOGASTRODUODENOSCOPY N/A 02/18/2017  Procedure: ESOPHAGOGASTRODUODENOSCOPY (EGD) with removal of food bolus;  Surgeon: [name], MD;  Location: [facility] ENDOSCOPY;  Service: Endoscopy;  Laterality: N/A;   ESOPHAGOGASTRODUODENOSCOPY (EGD) WITH PROPOFOL N/A 03/11/2017   Procedure: ESOPHAGOGASTRODUODENOSCOPY (EGD) WITH PROPOFOL;  Surgeon: [name], MD;  Location: [facility] ENDOSCOPY;  Service: Endoscopy;  Laterality: N/A;   ESOPHAGOGASTRODUODENOSCOPY (EGD) WITH PROPOFOL N/A 04/15/2017   Procedure: ESOPHAGOGASTRODUODENOSCOPY (EGD) WITH PROPOFOL;  Surgeon: [name], MD;  Location: [facility] ENDOSCOPY;  Service: Endoscopy;  Laterality: N/A;    ESOPHAGOGASTRODUODENOSCOPY (EGD) WITH PROPOFOL N/A 09/12/2020   Procedure: ESOPHAGOGASTRODUODENOSCOPY (EGD) WITH PROPOFOL;  Surgeon: Lin Landsman, MD;  Location: [facility] ENDOSCOPY;  Service: Gastroenterology;  Laterality: N/A;   EYE SURGERY Left    Cataract Extraction with IOL   INGUINAL HERNIA REPAIR Left 06/24/2016   Procedure: HERNIA REPAIR INGUINAL ADULT;  Surgeon: [name], MD;  Location: [facility] ORS;  Service: General;  Laterality: Left;   TONSILLECTOMY  1942    UPPER GASTROINTESTINAL ENDOSCOPY  3 years ago    with Dilation.    Social History:  reports that he has never smoked. He has never used smokeless tobacco. He reports that he does not drink alcohol and does not use drugs.  No Known Allergies  Family History  Problem Relation Age of Onset   Hypertension Mother    Arthritis Father    CAD Father    Other Other        no premature CAD.   Colon cancer Neg Hx    Prostate cancer Neg Hx    Stomach cancer Neg Hx     Prior to Admission medications   Medication Sig Start Date End Date Taking? Authorizing Provider  aspirin EC 81 MG tablet Take 1 tablet (81 mg total) by mouth daily. Swallow whole. 10/13/20   [name], MD  atorvastatin (LIPITOR) 40 MG tablet TAKE 1/2 TABLETS (20 MG TOTAL) BY MOUTH DAILY. 10/22/21   [name], MD  calcium carbonate (TUMS EX) 750 MG chewable tablet Chew 1 tablet by mouth as needed (heartburn).    [provider]  famotidine (PEPCID) 20 MG tablet TAKE 1 TABLET BY MOUTH EVERY DAY 03/29/21   [name], MD  finasteride (PROSCAR) 5 MG tablet TAKE 1 TABLET BY MOUTH EVERY DAY 03/29/21   [name], MD  hydrOXYzine (ATARAX) 10 MG tablet Take 10 mg by mouth 3 (three) times daily as needed for anxiety.    [provider]  isosorbide mononitrate (IMDUR) 30 MG 24 hr tablet Take 1 tablet (30 mg total) by mouth daily. 02/11/22   [name], MD  memantine (NAMENDA) 5 MG tablet TAKE 1 TABLET BY MOUTH  TWICE A DAY 12/07/21   [name], MD  Multiple Vitamin (MULTIVITAMIN) tablet Take 1 tablet by mouth daily.    [provider]  pantoprazole (PROTONIX) 40 MG tablet TAKE 1 TABLET BY MOUTH EVERY DAY Patient taking differently: Take 40 mg by mouth every evening. 10/22/21   [name], MD  sertraline (ZOLOFT) 100 MG tablet Take 1.5 tablets (150 mg total) by mouth daily. 01/22/22   [name], MD  tamsulosin (FLOMAX) 0.4 MG CAPS capsule Take 0.8 mg by mouth every evening.    [name], MD    Physical Exam: Vitals:   03/09/22 0945 03/09/22 1000 03/09/22 1030 03/09/22 1057  BP:  (!) 123/57 (!) 124/57   Pulse:  89 88 89  Resp:  (!) 28 (!) 38 19  Temp: [name])  101 F (38.3 C)     TempSrc: Rectal     SpO2:  97% 97% 99%   Physical Exam HENT:     Head: Normocephalic.     Mouth/Throat:     Pharynx: No oropharyngeal exudate.  Eyes:     General: Lids are normal.     Conjunctiva/sclera: Conjunctivae normal.  Cardiovascular:     Rate and Rhythm: Normal rate and regular rhythm.     Heart sounds: Normal heart sounds, S1 normal and S2 normal.  Pulmonary:     Breath sounds: Normal breath sounds. No decreased breath sounds, wheezing, rhonchi or rales.  Abdominal:     Palpations: Abdomen is soft.     Tenderness: There is no abdominal tenderness.  Musculoskeletal:     Right lower leg: No swelling.     Left lower leg: No swelling.  Skin:    General: Skin is warm.     Findings: No rash.  Neurological:     Mental Status: He is alert.     Comments: Able to straight leg raise and lift arms up off the bed to command.  Answers questions but poor historian.     Data Reviewed: Potassium 3.5, creatinine 1.31, hemoglobin 10.1, COVID test negative urinalysis shows red blood cells. CT scan showed no evidence of pulmonary embolism, multifocal airspace opacities left lower lobe with extensive groundglass opacities noted in the remaining portions.,  Gallstone, prostate  enlargement with mass effect on the bladder.  Assessment and Plan: * Sepsis ([place]) Sepsis, present on admission with tachypnea, tachycardia and fever and pneumonia seen on CT scan.  The patient also has acute metabolic encephalopathy with difficulty speaking at home.  Likely aspiration pneumonia.  ER physician gave Zithromax Maxipime and vancomycin.  I will switch antibiotics over to Unasyn and continue Zithromax for now.  Aspiration pneumonia ([place]) We will get swallow evaluation put on dysphagia diet.  Unasyn and Zithromax.  Dysphagia Will place on dysphagia diet with thickened liquids until speech therapy sees.  Dementia without behavioral disturbance ([place]) Continue Namenda.  Continue to monitor closely here in the hospital.  May end up needing a sitter.  Hairy cell leukemia (HCC) White blood count count 8.7 hemoglobin 10.1 and platelet count 156  CKD (chronic kidney disease), stage IIIa Continue to monitor.  Creatinine 1.31 with a GFR 53 on presentation  BPH (benign prostatic hyperplasia) Continue BPH medications  Overweight (BMI 25.0-29.9) With height and weight currently in the computer BMI 25.07  GERD (gastroesophageal reflux disease) Patient on Pepcid and Protonix  Hyperlipidemia Continue Lipitor      Advance Care Planning:   Code Status: DNR   Consults: None  Family Communication: Spoke with son at the bedside  Severity of Illness: The appropriate patient status for this patient is INPATIENT. Inpatient status is judged to be reasonable and necessary in order to provide the required intensity of service to ensure the patient's safety. The patient's presenting symptoms, physical exam findings, and initial radiographic and laboratory data in the context of their chronic comorbidities is felt to place them at high risk for further clinical deterioration. Furthermore, it is not anticipated that the patient will be medically stable for discharge from the hospital within 2  midnights of admission.   * I certify that at the point of admission it is my clinical judgment that the patient will require inpatient hospital care spanning beyond 2 midnights from the point of admission due to high intensity of service, high risk for further deterioration  and high frequency of surveillance required.*  Author: Loletha Grayer, MD 03/09/2022 1:05 PM  For on call review [email or web address].

## 2022-03-09 NOTE — Assessment & Plan Note (Signed)
Patient on Pepcid and Protonix

## 2022-03-09 NOTE — Assessment & Plan Note (Addendum)
With height and weight currently in the computer BMI 28.59

## 2022-03-09 NOTE — Assessment & Plan Note (Addendum)
High risk for aspiration.

## 2022-03-09 NOTE — Assessment & Plan Note (Signed)
-  Continue Lipitor °

## 2022-03-09 NOTE — Consult Note (Signed)
CODE SEPSIS - PHARMACY COMMUNICATION  **Broad Spectrum Antibiotics should be administered within 1 hour of Sepsis diagnosis**  Time Code Sepsis Called/Page Received: 09:45  Antibiotics Ordered: cefepime, vancomycin  Time of 1st antibiotic administration: 10:34  Additional action taken by pharmacy: none  Gretel Acre, PharmD PGY1 Pharmacy Resident 03/09/2022 11:20 AM

## 2022-03-09 NOTE — Assessment & Plan Note (Addendum)
Creatinine 1.32 with a GFR 52.

## 2022-03-09 NOTE — ED Triage Notes (Signed)
Pt to ED via [facility] from home for generalized weakness and altered mental status. Pt son found pt on the toilet and pt was too weak to get up. Pt son reports pt was recently in the hospital for pneumonia. Pt having difficulty getting his words out. Pt is alert on arrival. Pt is tachypneic but in NAD.

## 2022-03-09 NOTE — Consult Note (Signed)
PHARMACY -  BRIEF ANTIBIOTIC NOTE   Pharmacy has received consult(s) for code sepsis from an ED provider.  The patient's profile has been reviewed for ht/wt/allergies/indication/available labs.    One time order(s) placed for cefepime and vancomycin  Further antibiotics/pharmacy consults should be ordered by admitting physician if indicated.                       Thank you,  Gretel Acre, PharmD PGY1 Pharmacy Resident 03/09/2022 9:38 AM

## 2022-03-09 NOTE — Assessment & Plan Note (Addendum)
Continue Namenda

## 2022-03-09 NOTE — Consult Note (Signed)
Pharmacy Antibiotic Note  Frank Mcdaniel is a 88 y.o. male with PMH including HTN, BPH, hairy cell leukemia, mild dementia admitted on 03/09/2022 with  generalized weakness, AMS and pneumonia .  Pharmacy has been consulted for Unasyn dosing.  Plan:  Unasyn 3 g IV q6h  Temp (24hrs), Avg:101 F (38.3 C), Min:101 F (38.3 C), Max:101 F (38.3 C)  Recent Labs  Lab 03/09/22 0935  WBC 8.7  CREATININE 1.31*  LATICACIDVEN 1.1    CrCl cannot be calculated (Unknown ideal weight.).    No Known Allergies  Antimicrobials this admission: Cefepime 8/5 x 1 Vancomycin 8/5 x 1 Azithromycin 8/5 >>  Unasyn 8/5 >>   Dose adjustments this admission: N/A  Microbiology results: 8/5 BCx: pending  Thank you for allowing pharmacy to be a part of this patient's care.  Benita Gutter 03/09/2022 12:55 PM

## 2022-03-09 NOTE — Assessment & Plan Note (Addendum)
Case discussed with speech therapist and patient is at high risk for aspiration.  Family does not want a feeding tube.  Family declined palliative care consultation as outpatient.

## 2022-03-09 NOTE — Assessment & Plan Note (Addendum)
Sepsis, present on admission with tachypnea, tachycardia and fever and pneumonia seen on CT scan.  Patient had acute metabolic encephalopathy with difficulty speaking yesterday.  Aspiration pneumonia.  ER physician gave Zithromax Maxipime and vancomycin 1 dose.  I switched antibiotics to Unasyn and Zithromax.  The patient remained afebrile and antibiotic switched over to Augmentin for 3 more days and Zithromax for 2 days upon discharge.

## 2022-03-09 NOTE — Evaluation (Signed)
Physical Therapy Evaluation Patient Details Name: Frank Mcdaniel MRN: [id number] DOB: 10-10-1932 Today's Date: 03/09/2022  History of Present Illness  Frank Mcdaniel is a 88 y.o. male with medical history significant of dementia, BPH, hairy cell leukemia, GERD, hyperlipidemia, CAD.  Per son on day of arrival he was very slow in getting ready and just "no right."   He was unable to get off the toilet or speak in full sentences.  Had a temperature of 101-1 02 at home.  He has a history of dysphagia and he has a chronic cough.  He was brought in and found to have pneumonia on CT scan  Clinical Impression  Pt pleasant and motivated t/o PT session.  He did need some minimal assist to get up to sitting from supine, but otherwise did well with no real direct physical assist getting to standing and subsequently circumambulating the nurses station; he was able to maintain O2 in the high 90s during the effort on room air.  Pt is not at his baseline of being able to ambulate w/o AD, but did well and should be safe to go home needing near 24/7 supervision and HHPT.      Recommendations for follow up therapy are one component of a multi-disciplinary discharge planning process, led by the attending physician.  Recommendations may be updated based on patient status, additional functional criteria and insurance authorization.  Follow Up Recommendations Home health PT      Assistance Recommended at Discharge Frequent or constant Supervision/Assistance  Patient can return home with the following       Equipment Recommendations None recommended by PT  Recommendations for Other Services       Functional Status Assessment Patient has had a recent decline in their functional status and demonstrates the ability to make significant improvements in function in a reasonable and predictable amount of time.     Precautions / Restrictions Precautions Precautions: Fall Restrictions Weight Bearing Restrictions:  No      Mobility  Bed Mobility Overal bed mobility: Needs Assistance Bed Mobility: Supine to Sit     Supine to sit: Min assist     General bed mobility comments: Pt did need light UE use to get himself up to sitting EOB    Transfers Overall transfer level: Modified independent Equipment used: Rolling walker (2 wheels)               General transfer comment: Pt was able to rise to standing w/o assist, cuing for UE and appropriate walker use    Ambulation/Gait Ambulation/Gait assistance: Min guard Gait Distance (Feet): 200 Feet Assistive device: Rolling walker (2 wheels)         General Gait Details: Pt with slow but steady and safe cadence.  He is not used to using the walker and actually did a little ambulation w/o it, however was much more stable and confident with it.  O2 in the high high 90s t/o on room air  Stairs            Wheelchair Mobility    Modified Rankin (Stroke Patients Only)       Balance Overall balance assessment: Needs assistance Sitting-balance support: No upper extremity supported Sitting balance-Leahy Scale: Good     Standing balance support: Bilateral upper extremity supported Standing balance-Leahy Scale: Fair  Pertinent Vitals/Pain Pain Assessment Pain Assessment: No/denies pain    Home Living Family/patient expects to be discharged to:: Private residence Living Arrangements: Children Available Help at Discharge: Available 24 hours/day;Personal care attendant;Family (sitters while son is at work)   Home Access: Stairs to enter Entrance Stairs-Rails:  ("yes some") Technical brewer of Steps: 3     Home Equipment: Conservation officer, nature (2 wheels);Cane - single point      Prior Function               Mobility Comments: Pt always with someone, but able to do prolonged bouts of ambulation w/o AD ADLs Comments: showers and usesb bathroom on his own     Hand Dominance         Extremity/Trunk Assessment   Upper Extremity Assessment Upper Extremity Assessment: Generalized weakness;Overall [facility] for tasks assessed    Lower Extremity Assessment Lower Extremity Assessment: Generalized weakness;Overall WFL for tasks assessed       Communication   Communication: No difficulties  Cognition Arousal/Alertness: Awake/alert Behavior During Therapy: Flat affect Overall Cognitive Status: History of cognitive impairments - at baseline                                 General Comments: Per son not quite back to baseline, but much better than this morning        General Comments      Exercises     Assessment/Plan    PT Assessment Patient needs continued PT services  PT Problem List Decreased range of motion;Decreased balance;Decreased mobility;Decreased strength;Decreased activity tolerance;Decreased cognition;Decreased coordination;Decreased knowledge of use of DME;Decreased safety awareness       PT Treatment Interventions DME instruction;Gait training;Stair training;Functional mobility training;Therapeutic activities;Therapeutic exercise;Balance training;Cognitive remediation;Patient/family education    PT Goals (Current goals can be found in the Care Plan section)  Acute Rehab PT Goals Patient Stated Goal: go home PT Goal Formulation: With patient/family Time For Goal Achievement: 03/22/22 Potential to Achieve Goals: Good    Frequency Min 2X/week     Co-evaluation               AM-PAC PT "6 Clicks" Mobility  Outcome Measure Help needed turning from your back to your side while in a flat bed without using bedrails?: A Little Help needed moving from lying on your back to sitting on the side of a flat bed without using bedrails?: A Little Help needed moving to and from a bed to a chair (including a wheelchair)?: A Little Help needed standing up from a chair using your arms (e.g., wheelchair or bedside chair)?: A  Little Help needed to walk in hospital room?: A Little Help needed climbing 3-5 steps with a railing? : A Little 6 Click Score: 18    End of Session Equipment Utilized During Treatment: Gait belt Activity Tolerance: Patient tolerated treatment well Patient left: with chair alarm set;with call bell/phone within reach;with family/visitor present Nurse Communication: Mobility status (O2 sats stayed in 90s on room air w/ activity) PT Visit Diagnosis: Muscle weakness (generalized) (M62.81);Difficulty in walking, not elsewhere classified (R26.2)    Time: 7824-2353 PT Time Calculation (min) (ACUTE ONLY): 33 min   Charges:   PT Evaluation $PT Eval Low Complexity: 1 Low PT Treatments $Gait Training: 8-22 mins        Kreg Shropshire, DPT 03/09/2022, 4:19 PM

## 2022-03-09 NOTE — ED Provider Notes (Signed)
[facility] Provider Note    Event Date/Time   First MD Initiated Contact with Patient 03/09/22 [phone]     (approximate)   History   Weakness and Altered Mental Status  Level V caveat:  AMS HPI  Frank Mcdaniel is a 88 y.o. male with a history of dementia as well as dysphagia presents to the ER for altered mental status cough shortness of breath.  Found to be febrile.  Recent admission for pneumonia.  Lives at home with family.  Patient unable to provide much additional history due to confusion.     Physical Exam   Triage Vital Signs: ED Triage Vitals  Enc Vitals Group     BP 03/09/22 0926 (!) 148/73     Pulse Rate 03/09/22 0926 94     Resp 03/09/22 0926 (!) 31     Temp --      Temp src --      SpO2 03/09/22 0921 90 %     Weight --      Height --      Head Circumference --      Peak Flow --      Pain Score 03/09/22 0926 0     Pain Loc --      Pain Edu? --      Excl. in [place]? --     Most recent vital signs: Vitals:   03/09/22 1030 03/09/22 1057  BP: (!) 124/57   Pulse: 88 89  Resp: (!) 38 19  Temp:    SpO2: 97% 99%     Constitutional: Alert but disoriented x 2, ill appearing Eyes: Conjunctivae are normal.  Head: Atraumatic. Nose: No congestion/rhinnorhea. Mouth/Throat: Mucous membranes are moist.   Neck: Painless ROM.  Cardiovascular:   Good peripheral circulation., no m/g/r Respiratory: Normal respiratory effort.  No retractions.  Gastrointestinal: Soft and nontender.  Musculoskeletal:  no deformity Neurologic:  MAE spontaneously. No gross focal neurologic deficits are appreciated.  Skin:  Skin is warm, dry and intact. No rash noted. Psychiatric: Mood and affect are normal. Speech and behavior are normal.    ED Results / Procedures / Treatments   Labs (all labs ordered are listed, but only abnormal results are displayed) Labs Reviewed  COMPREHENSIVE METABOLIC PANEL - Abnormal; Notable for the following components:       Result Value   Glucose, Bld 110 (*)    Creatinine, Ser 1.31 (*)    GFR, Estimated 53 (*)    All other components within normal limits  CBC WITH DIFFERENTIAL/PLATELET - Abnormal; Notable for the following components:   RBC 3.10 (*)    Hemoglobin 10.1 (*)    HCT 31.1 (*)    MCV 100.3 (*)    Neutro Abs 8.2 (*)    Lymphs Abs 0.1 (*)    All other components within normal limits  URINALYSIS, COMPLETE (UACMP) WITH MICROSCOPIC - Abnormal; Notable for the following components:   Color, Urine YELLOW (*)    APPearance CLEAR (*)    Hgb urine dipstick MODERATE (*)    RBC / HPF >50 (*)    All other components within normal limits  RESP PANEL BY RT-PCR (FLU A&B, COVID) ARPGX2  CULTURE, BLOOD (ROUTINE X 2)  CULTURE, BLOOD (ROUTINE X 2)  LACTIC ACID, PLASMA  PROTIME-INR  APTT  LACTIC ACID, PLASMA     EKG  ED ECG REPORT I, Merlyn Lot, the attending physician, personally viewed and interpreted this ECG.   Date: 03/09/2022  EKG Time: 9:25  Rate: 95  Rhythm: sinus  Axis: normal  Intervals:rbbb  ST&T Change: no stemi, nonspecific st abn    RADIOLOGY Please see ED Course for my review and interpretation.  I personally reviewed all radiographic images ordered to evaluate for the above acute complaints and reviewed radiology reports and findings.  These findings were personally discussed with the patient.  Please see medical record for radiology report.    PROCEDURES:  Critical Care performed: Yes, see critical care procedure note(s)  .Critical Care  Performed by: Merlyn Lot, MD Authorized by: Merlyn Lot, MD   Critical care provider statement:    Critical care time (minutes):  40   Critical care was necessary to treat or prevent imminent or life-threatening deterioration of the following conditions:  Sepsis   Critical care was time spent personally by me on the following activities:  Ordering and performing treatments and interventions, ordering and review of  laboratory studies, ordering and review of radiographic studies, pulse oximetry, re-evaluation of patient's condition, review of old charts, obtaining history from patient or surrogate, examination of patient, evaluation of patient's response to treatment, discussions with primary provider, discussions with consultants and development of treatment plan with patient or surrogate    MEDICATIONS ORDERED IN ED: Medications  vancomycin (VANCOCIN) IVPB 1000 mg/200 mL premix (has no administration in time range)  ceFEPIme (MAXIPIME) 2 g in sodium chloride 0.9 % 100 mL IVPB (0 g Intravenous Stopped 03/09/22 1154)  azithromycin (ZITHROMAX) 500 mg in sodium chloride 0.9 % 250 mL IVPB ( Intravenous Restarted 03/09/22 1155)  iohexol (OMNIPAQUE) 350 MG/ML injection 100 mL (100 mLs Intravenous Contrast Given 03/09/22 1115)     IMPRESSION / MDM / [place] / ED COURSE  I reviewed the triage vital signs and the nursing notes.                              Differential diagnosis includes, but is not limited to, Dehydration, sepsis, pna, uti, hypoglycemia, cva, drug effect, withdrawal, encephalitis  Patient presented to the ER for evaluation of symptoms as described above  This presenting complaint could reflect a potentially life-threatening illness therefore the patient will be placed on continuous pulse oximetry and telemetry for monitoring.  Laboratory evaluation will be sent to evaluate for the above complaints.      Clinical Course as of 03/09/22 1225  Sat Mar 09, 2022  1006 Chest x-ray on my review and interpretation without evidence of pneumothorax.  Will await formal radiology report. [PR]  1054 Lactate normal.  No leukocytosis.  INR normal.  COVID normal.  Urine with many RBCs.  Awaiting CT imaging. [PR]  9702 CT imaging on my review and interpretation does not show any evidence of pulmonary embolus SDH or IPH.  Will await formal radiology report [PR]  1218 CT imaging without evidence of PE  does have evidence of multilobar pneumonia.  Given altered mental status fever for pneumonia will consult hospitalist for admission. [PR]    Clinical Course User Index [PR] Merlyn Lot, MD     FINAL CLINICAL IMPRESSION(S) / ED DIAGNOSES   Final diagnoses:  Sepsis with encephalopathy without septic shock, due to unspecified organism [facility])     Rx / DC Orders   ED Discharge Orders     None        Note:  This document was prepared using Dragon voice recognition software and may include unintentional dictation errors.  Merlyn Lot, MD 03/09/22 1225

## 2022-03-10 DIAGNOSIS — G9340 Encephalopathy, unspecified: Secondary | ICD-10-CM

## 2022-03-10 DIAGNOSIS — E876 Hypokalemia: Secondary | ICD-10-CM

## 2022-03-10 DIAGNOSIS — G9341 Metabolic encephalopathy: Secondary | ICD-10-CM | POA: Diagnosis not present

## 2022-03-10 DIAGNOSIS — E663 Overweight: Secondary | ICD-10-CM

## 2022-03-10 DIAGNOSIS — N1831 Chronic kidney disease, stage 3a: Secondary | ICD-10-CM | POA: Diagnosis not present

## 2022-03-10 DIAGNOSIS — J690 Pneumonitis due to inhalation of food and vomit: Secondary | ICD-10-CM | POA: Diagnosis not present

## 2022-03-10 DIAGNOSIS — R6520 Severe sepsis without septic shock: Secondary | ICD-10-CM

## 2022-03-10 DIAGNOSIS — A419 Sepsis, unspecified organism: Secondary | ICD-10-CM | POA: Diagnosis not present

## 2022-03-10 LAB — CBC
HCT: 30.2 % — ABNORMAL LOW (ref 39.0–52.0)
Hemoglobin: 9.7 g/dL — ABNORMAL LOW (ref 13.0–17.0)
MCH: 32 pg (ref 26.0–34.0)
MCHC: 32.1 g/dL (ref 30.0–36.0)
MCV: 99.7 fL (ref 80.0–100.0)
Platelets: 132 10*3/uL — ABNORMAL LOW (ref 150–400)
RBC: 3.03 MIL/uL — ABNORMAL LOW (ref 4.22–5.81)
RDW: 14 % (ref 11.5–15.5)
WBC: 7.4 10*3/uL (ref 4.0–10.5)
nRBC: 0 % (ref 0.0–0.2)

## 2022-03-10 LAB — BASIC METABOLIC PANEL
Anion gap: 10 (ref 5–15)
BUN: 17 mg/dL (ref 8–23)
CO2: 22 mmol/L (ref 22–32)
Calcium: 8.6 mg/dL — ABNORMAL LOW (ref 8.9–10.3)
Chloride: 109 mmol/L (ref 98–111)
Creatinine, Ser: 1.32 mg/dL — ABNORMAL HIGH (ref 0.61–1.24)
GFR, Estimated: 52 mL/min — ABNORMAL LOW (ref >60)
Glucose, Bld: 90 mg/dL (ref 70–99)
Potassium: 3.3 mmol/L — ABNORMAL LOW (ref 3.5–5.1)
Sodium: 141 mmol/L (ref 135–145)

## 2022-03-10 MED ORDER — POTASSIUM CHLORIDE CRYS ER 20 MEQ PO TBCR
20.0000 meq | EXTENDED_RELEASE_TABLET | Freq: Once | ORAL | Status: AC
  Administered 2022-03-10: 20 meq via ORAL
  Filled 2022-03-10: qty 1

## 2022-03-10 NOTE — Progress Notes (Signed)
SLP Cancellation Note  Patient Details Name: Frank Mcdaniel MRN: [id number] DOB: March 04, 1933   Cancelled treatment:       Reason Eval/Treat Not Completed: SLP screened, no needs identified, will sign off  SLP consult received and appreciated. Chart review completed. Pt with well-documented hx with most recent MBS completed May 2022 noting silent aspiration across all consistencies and concern for concomitant esophageal dysphagia. Spoke with [name] and his son, [name]. Pt did not engage much in conversation. Son politely declined SLP evaluation. Son is well-versed in risks/sequelae of aspiration. He seems to understand the chronicity of pt's dysphagia in setting of dementia. He is not open to feeding tube, and he does not see a difference in pt's swallow function on thickened liquids as pt has utilized in the past. Recommend Palliative Care consult to discuss [place]. SLP to sign off as pt has no acute SLP needs at this time.  MD (Dr. Leslye Peer) and RN made aware of the above.  Time:  [id number]  Cherrie Gauze, M.S., [facility] [phone] [place])   Quintella Baton 03/10/2022, 10:32 AM

## 2022-03-10 NOTE — Progress Notes (Signed)
  Progress Note   Patient: Frank Mcdaniel YJE:[id number] DOB: [birth year] DOA: 03/09/2022     1 DOS: the patient was seen and examined on 03/10/2022     Assessment and Plan: * Sepsis ([place]) Sepsis, present on admission with tachypnea, tachycardia and fever and pneumonia seen on CT scan.  Patient had acute metabolic encephalopathy with difficulty speaking yesterday.  Aspiration pneumonia.  ER physician gave Zithromax Maxipime and vancomycin 1 dose.  I switched antibiotics to Unasyn and Zithromax.  If remains afebrile today may be able to go home tomorrow.  Acute metabolic encephalopathy Acute metabolic encephalopathy secondary to sepsis with difficulty speaking on the day of admission.  Aspiration pneumonia [facility]) Case discussed with speech therapist and patient is at high risk for aspiration.  Family does not want a feeding tube.  Dysphagia High risk for aspiration.  Dementia without behavioral disturbance ([place]) Continue Namenda.  Hairy cell leukemia (HCC) White blood cell count 7.4, hemoglobin 9.7 and platelet count 132  CKD (chronic kidney disease), stage IIIa Continue to monitor.  Creatinine 1.32 with a GFR 52.  BPH (benign prostatic hyperplasia) Continue BPH medications  Hypokalemia Replace potassium orally  Overweight (BMI 25.0-29.9) With height and weight currently in the computer BMI 28.59  GERD (gastroesophageal reflux disease) Patient on Pepcid and Protonix  Hyperlipidemia Continue Lipitor        Subjective: Patient feels okay.  Offers no complaints.  Wanting to go home.  Admitted yesterday with sepsis and bilateral pneumonia.  Physical Exam: Vitals:   03/09/22 1447 03/09/22 1918 03/10/22 0448 03/10/22 0755  BP:  (!) 122/53 (!) 146/61 131/89  Pulse:  67 (!) 54 (!) 55  Resp:  20 20 (!) 22  Temp:  98.8 F (37.1 C) 98.9 F (37.2 C) 98.4 F (36.9 C)  TempSrc:  Oral Oral Oral  SpO2:  94% 95% 97%  Weight: 82.8 kg     Height: '5\' 7"'$  (1.702 m)       Physical Exam HENT:     Head: Normocephalic.     Mouth/Throat:     Pharynx: No oropharyngeal exudate.  Eyes:     General: Lids are normal.     Conjunctiva/sclera: Conjunctivae normal.  Cardiovascular:     Rate and Rhythm: Normal rate and regular rhythm.     Heart sounds: Normal heart sounds, S1 normal and S2 normal.  Pulmonary:     Breath sounds: Normal breath sounds. No decreased breath sounds, wheezing, rhonchi or rales.  Abdominal:     Palpations: Abdomen is soft.     Tenderness: There is no abdominal tenderness.  Musculoskeletal:     Right lower leg: No swelling.     Left lower leg: No swelling.  Skin:    General: Skin is warm.     Findings: No rash.  Neurological:     Mental Status: He is alert.     Comments: Able to straight leg raise and lift arms up off the bed to command.  Answers questions but poor historian.     Data Reviewed: Hemoglobin 9.7, platelet count 132, potassium 3.3, creatinine 1.32  Family Communication: Spoke with son at the bedside  Disposition: Status is: Inpatient Remains inpatient appropriate because: Being treated for sepsis with IV antibiotics.  Planned Discharge Destination: Home with Home Health    Time spent: 28 minutes  Author: Loletha Grayer, MD 03/10/2022 11:39 AM  For on call review [email or web address].

## 2022-03-10 NOTE — TOC Initial Note (Signed)
Transition of Care [facility]) - Initial/Assessment Note    Patient Details  Name: Frank Mcdaniel MRN: [id number] Date of Birth: 12-13-1932  Transition of [facility]) CM/SW Contact:    Frank David, RN Phone Number: 03/10/2022, 11:24 AM  Clinical Narrative:                 Spoke with son Frank Mcdaniel at bedside.  Patient lives with son, has 24/7 care, sitters when son is working.  Dr. Caryl Bis is PCP, [facility]  Aware that PT recommended patient to discharge home with HHPT, son does not feel he needs it.  State patient has walker in the home but rarely uses it, no other DME needed.  He would like to wait for another session with PT prior to making decision to be active with home health. Patient also has monthly visits with [facility] through [facility], son will inform them of patient's admission.   Expected Discharge Plan: [place] Barriers to Discharge: Continued Medical Work up   Patient Goals and CMS Choice Patient states their goals for this hospitalization and ongoing recovery are:: Home with home health CMS Medicare.gov Compare Post Acute Care list provided to:: Patient Represenative (must comment) Choice offered to / list presented to : [facility] POA / Guardian  Expected Discharge Plan and Services Expected Discharge Plan: [place] Acute Care Choice: [place] arrangements for the past 2 months: Single Family Home                                      Prior Living Arrangements/Services Living arrangements for the past 2 months: Single Family Home Lives with:: Adult Children          Need for Family Participation in Patient Care: Yes (Comment) Care giver support system in place?: Yes (comment)   Criminal Activity/Legal Involvement Pertinent to Current Situation/Hospitalization: No - Comment as needed  Activities of Daily Living Home Assistive Devices/Equipment: None ADL Screening (condition at time of  admission) Patient's cognitive ability adequate to safely complete daily activities?: No Is the patient deaf or have difficulty hearing?: Yes Does the patient have difficulty seeing, even when wearing glasses/contacts?: No Does the patient have difficulty concentrating, remembering, or making decisions?: Yes Patient able to express need for assistance with ADLs?: Yes Does the patient have difficulty dressing or bathing?: No Independently performs ADLs?: Yes (appropriate for developmental age) Does the patient have difficulty walking or climbing stairs?: Yes Weakness of Legs: Both Weakness of Arms/Hands: None  Permission Sought/Granted                  Emotional Assessment Appearance:: Appears stated age Attitude/Demeanor/Rapport: Engaged Affect (typically observed): Appropriate Orientation: : Oriented to Self   Psych Involvement: No (comment)  Admission diagnosis:  Sepsis ([place]) [A41.9] Sepsis with encephalopathy without septic shock, due to unspecified organism ([place]) [A41.9, R65.20, G93.40] Patient Active Problem List   Diagnosis Date Noted   Sepsis ([place]) 95/63/8756   Acute metabolic encephalopathy 43/32/9518   Overweight (BMI 25.0-29.9) 02/16/2022   Viral illness 11/07/2021   Pharyngoesophageal phase dysphagia 04/17/2021   Aspiration pneumonia ([place]) 11/27/2020   Fall 11/13/2020   CKD (chronic kidney disease), stage IIIa 02/28/2020   IBS (irritable bowel syndrome) 08/02/2019   Dementia without behavioral disturbance ([place]) 05/28/2019   Dysphagia    Constipation 05/21/2016  Bilateral low back pain without sciatica 12/21/2015   Anxiety 09/11/2015   Anemia 09/01/2015   GERD (gastroesophageal reflux disease) 06/02/2015   Asthma 01/28/2015   BPH (benign prostatic hyperplasia) 01/28/2015   Hairy cell leukemia ([place]) 01/28/2015   Coronary artery disease    S/P CABG (coronary artery bypass graft) 11/08/2010   HTN (hypertension) 11/08/2010   Hyperlipidemia 10/27/2009    PCP:  [name], MD Pharmacy:   [id number] [place] [place] [id number]: [phone]: [phone]    Social Determinants of Health (SDOH) Interventions    Readmission Risk Interventions    11/14/2020   11:46 AM 11/14/2020   11:21 AM 11/10/2019    1:04 PM  Readmission Risk Prevention Plan  Transportation Screening Complete  Complete  PCP or Specialist Appt within 3-5 Days Complete  Complete  HRI or Home Care Consult Complete  Complete  Social Work Consult for ROsbornPlanning/Counseling Complete    Palliative Care Screening Not Applicable Not Applicable   Medication Review (Press photographer Complete  Complete

## 2022-03-10 NOTE — Assessment & Plan Note (Addendum)
Replaced. °

## 2022-03-10 NOTE — Assessment & Plan Note (Addendum)
Acute metabolic encephalopathy secondary to sepsis with difficulty speaking on the day of admission.  Mental status back to baseline.  The patient does have a history of underlying dementia.

## 2022-03-11 DIAGNOSIS — G9341 Metabolic encephalopathy: Secondary | ICD-10-CM | POA: Diagnosis not present

## 2022-03-11 DIAGNOSIS — J690 Pneumonitis due to inhalation of food and vomit: Secondary | ICD-10-CM | POA: Diagnosis not present

## 2022-03-11 DIAGNOSIS — A419 Sepsis, unspecified organism: Secondary | ICD-10-CM | POA: Diagnosis not present

## 2022-03-11 DIAGNOSIS — R1310 Dysphagia, unspecified: Secondary | ICD-10-CM | POA: Diagnosis not present

## 2022-03-11 MED ORDER — AMOXICILLIN-POT CLAVULANATE 875-125 MG PO TABS: 1.0000 | ORAL_TABLET | Freq: Two times a day (BID) | ORAL | 0 refills | Status: AC

## 2022-03-11 MED ORDER — AMOXICILLIN-POT CLAVULANATE 875-125 MG PO TABS
1.0000 | ORAL_TABLET | Freq: Two times a day (BID) | ORAL | Status: DC
  Administered 2022-03-11: 1 via ORAL
  Filled 2022-03-11: qty 1

## 2022-03-11 MED ORDER — AZITHROMYCIN 250 MG PO TABS: ORAL_TABLET | ORAL | 0 refills | Status: DC

## 2022-03-11 NOTE — Discharge Summary (Signed)
Physician Discharge Summary   Patient: Frank Mcdaniel MRN: [id number] DOB: 11/26/1932  Admit date:     03/09/2022  Discharge date: 03/11/22  Discharge Physician: Loletha Grayer   PCP: [name], MD   Recommendations at discharge:   Follow-up PCP 5 days  Discharge Diagnoses: Principal Problem:   Sepsis ([place]) Active Problems:   Acute metabolic encephalopathy   Aspiration pneumonia (HCC)   Dysphagia   Dementia without behavioral disturbance (HCC)   Hairy cell leukemia (HCC)   CKD (chronic kidney disease), stage IIIa   BPH (benign prostatic hyperplasia)   Hyperlipidemia   GERD (gastroesophageal reflux disease)   Overweight (BMI 25.0-29.9)   Hypokalemia   Hospital Course: The patient was admitted to the hospital on 03/09/2022 and discharged on 03/11/2022.  Patient was admitted with clinical sepsis with multifocal pneumonia seen on CT scan of the chest.  The patient had tachypnea, tachycardia and fever and acute metabolic encephalopathy on presentation with difficulty speaking.  Patient was given triple antibiotics in the ER and I switched over to Unasyn and Zithromax.  Patient was switched over to Augmentin and Zithromax upon discharge home.  The patient does have chronic dysphagia but is not going to be compliant with the dysphagia diet.  The patient does have a chronic cough.  The patient is a DO NOT RESUSCITATE.  Case discussed with son and he declined palliative care consultation at this point.  I also explained that we do not have to treat every aspiration pneumonia but can treat once as altered mental status or fever or signs of sepsis.  Assessment and Plan: * Sepsis ([place]) Sepsis, present on admission with tachypnea, tachycardia and fever and pneumonia seen on CT scan.  Patient had acute metabolic encephalopathy with difficulty speaking yesterday.  Aspiration pneumonia.  ER physician gave Zithromax Maxipime and vancomycin 1 dose.  I switched antibiotics to Unasyn and  Zithromax.  The patient remained afebrile and antibiotic switched over to Augmentin for 3 more days and Zithromax for 2 days upon discharge.  Acute metabolic encephalopathy Acute metabolic encephalopathy secondary to sepsis with difficulty speaking on the day of admission.  Mental status back to baseline.  The patient does have a history of underlying dementia.  Aspiration pneumonia [facility]) Case discussed with speech therapist and patient is at high risk for aspiration.  Family does not want a feeding tube.  Family declined palliative care consultation as outpatient.  Dysphagia High risk for aspiration.  Dementia without behavioral disturbance ([place]) Continue Namenda.  Hairy cell leukemia (HCC) White blood cell count 7.4, hemoglobin 9.7 and platelet count 132  CKD (chronic kidney disease), stage IIIa Creatinine 1.32 with a GFR 52.  BPH (benign prostatic hyperplasia) Continue BPH medications  Hypokalemia Replaced  Overweight (BMI 25.0-29.9) With height and weight currently in the computer BMI 28.59  GERD (gastroesophageal reflux disease) Patient on Pepcid and Protonix  Hyperlipidemia Continue Lipitor         Consultants: Speech therapy Procedures performed: None Disposition: Home health Diet recommendation:  Dysphagia diet recommended with thickened liquids DISCHARGE MEDICATION: Allergies as of 03/11/2022   No Known Allergies      Medication List     TAKE these medications    amoxicillin-clavulanate 875-125 MG tablet Commonly known as: AUGMENTIN Take 1 tablet by mouth every 12 (twelve) hours for 3 days.   aspirin EC 81 MG tablet Take 1 tablet (81 mg total) by mouth daily. Swallow whole.   atorvastatin 40 MG tablet Commonly known as: LIPITOR TAKE  1/2 TABLETS (20 MG TOTAL) BY MOUTH DAILY.   azithromycin 250 MG tablet Commonly known as: ZITHROMAX One tab po daily for two days Start taking on: March 12, 2022   calcium carbonate 750 MG chewable  tablet Commonly known as: TUMS EX Chew 1 tablet by mouth as needed (heartburn).   famotidine 20 MG tablet Commonly known as: PEPCID TAKE 1 TABLET BY MOUTH EVERY DAY   finasteride 5 MG tablet Commonly known as: PROSCAR TAKE 1 TABLET BY MOUTH EVERY DAY   hydrOXYzine 10 MG tablet Commonly known as: ATARAX Take 10 mg by mouth 3 (three) times daily as needed for anxiety.   isosorbide mononitrate 30 MG 24 hr tablet Commonly known as: IMDUR Take 1 tablet (30 mg total) by mouth daily.   memantine 5 MG tablet Commonly known as: NAMENDA TAKE 1 TABLET BY MOUTH TWICE A DAY   multivitamin tablet Take 1 tablet by mouth daily.   pantoprazole 40 MG tablet Commonly known as: PROTONIX TAKE 1 TABLET BY MOUTH EVERY DAY What changed: when to take this   sertraline 100 MG tablet Commonly known as: Zoloft Take 1.5 tablets (150 mg total) by mouth daily.   tamsulosin 0.4 MG Caps capsule Commonly known as: FLOMAX Take 0.8 mg by mouth every evening.        Follow-up Information     [name], MD. Go on 03/18/2022.   Specialty: Family Medicine Why: Appointment @ 11:00 am. Contact information: [address] [place] [address] [phone]                Discharge Exam: Filed Weights   03/09/22 1447  Weight: 82.8 kg   Physical Exam HENT:     Head: Normocephalic.     Mouth/Throat:     Pharynx: No oropharyngeal exudate.  Eyes:     General: Lids are normal.     Conjunctiva/sclera: Conjunctivae normal.  Cardiovascular:     Rate and Rhythm: Normal rate and regular rhythm.     Heart sounds: Normal heart sounds, S1 normal and S2 normal.  Pulmonary:     Breath sounds: Normal breath sounds. No decreased breath sounds, wheezing, rhonchi or rales.  Abdominal:     Palpations: Abdomen is soft.     Tenderness: There is no abdominal tenderness.  Musculoskeletal:     Right lower leg: No swelling.     Left lower leg: No swelling.  Skin:    General: Skin  is warm.     Findings: No rash.  Neurological:     Mental Status: He is alert.     Comments: Able to straight leg raise and lift arms up off the bed to command.  Answers questions but poor historian.      Condition at discharge: stable  The results of significant diagnostics from this hospitalization (including imaging, microbiology, ancillary and laboratory) are listed below for reference.   Imaging Studies: CT HEAD WO CONTRAST (5MM)  Result Date: 03/09/2022 CLINICAL DATA:  Mental status change, unknown cause. Generalized weakness. EXAM: CT HEAD WITHOUT CONTRAST TECHNIQUE: Contiguous axial images were obtained from the base of the skull through the vertex without intravenous contrast. RADIATION DOSE REDUCTION: This exam was performed according to the departmental dose-optimization program which includes automated exposure control, adjustment of the mA and/or kV according to patient size and/or use of iterative reconstruction technique. COMPARISON:  Head MRI 06/17/2019 FINDINGS: Brain: There is no evidence of an acute infarct, intracranial hemorrhage, mass, midline shift, or extra-axial  fluid collection. Moderate cerebral atrophy is similar to the prior MRI, and prominent volume loss is again noted in the right greater than left mesial temporal lobes. Hypodensities in the cerebral white matter bilaterally are nonspecific but compatible with mild-to-moderate chronic small vessel ischemic disease. Vascular: Calcified atherosclerosis at the skull base. No hyperdense vessel. Skull: No fracture or suspicious osseous lesion. Sinuses/Orbits: Visualized paranasal sinuses and mastoid air cells are clear. Left cataract extraction. Other: None. IMPRESSION: 1. No evidence of acute intracranial abnormality. 2. Mild-to-moderate chronic small vessel ischemic disease. 3.  Cerebral atrophy (ICD10-G31.9). Electronically Signed   By: Logan Bores M.D.   On: 03/09/2022 12:10   CT ABDOMEN PELVIS W CONTRAST  Result  Date: 03/09/2022 CLINICAL DATA:  Sepsis. High probability for pulmonary embolism. Weakness. EXAM: CT ANGIOGRAPHY CHEST CT ABDOMEN AND PELVIS WITH CONTRAST TECHNIQUE: Multidetector CT imaging of the chest was performed using the standard protocol during bolus administration of intravenous contrast. Multiplanar CT image reconstructions and MIPs were obtained to evaluate the vascular anatomy. Multidetector CT imaging of the abdomen and pelvis was performed using the standard protocol during bolus administration of intravenous contrast. RADIATION DOSE REDUCTION: This exam was performed according to the departmental dose-optimization program which includes automated exposure control, adjustment of the mA and/or kV according to patient size and/or use of iterative reconstruction technique. CONTRAST:  149m OMNIPAQUE IOHEXOL 350 MG/ML SOLN COMPARISON:  CT chest from 12/12/2020. CT chest, abdomen and pelvis 09/24/2020. FINDINGS: CTA CHEST FINDINGS Cardiovascular: Satisfactory opacification of the pulmonary arteries to the segmental level. No evidence of pulmonary embolism. Status post median sternotomy and CABG procedure. Aortic atherosclerotic calcifications. Normal heart size. No pericardial effusion. Mediastinum/Nodes: Thyroid gland and trachea demonstrate no significant findings. The esophagus appears diffusely patulous with mild diffuse circumferential wall thickening. The lumen of the esophagus is filled with fluid. No enlarged axillary, supraclavicular, mediastinal, or hilar lymph nodes. Lungs/Pleura: Small left pleural effusion. Multifocal multifocal airspace opacities are identified within the left lower lobe with extensive ground-glass opacities noted within the remaining portions of the lungs. Imaging findings are concerning for multifocal pneumonia. No signs atelectasis or pneumothorax. Musculoskeletal: No chest wall abnormality. No acute or significant osseous findings. Review of the MIP images confirms the  above findings. CT ABDOMEN and PELVIS FINDINGS Hepatobiliary: No suspicious focal liver abnormality. Small subcapsular cyst is noted overlying the right hepatic lobe measuring 1.1 cm. There is a 2.3 cm gallstone identified. No pericholecystic inflammation or bile duct dilatation noted. Pancreas: Unremarkable. No pancreatic ductal dilatation or surrounding inflammatory changes. Spleen: Normal in size without focal abnormality. Adrenals/Urinary Tract: Normal appearance of the adrenal glands. No nephrolithiasis, hydronephrosis or mass identified bilaterally. No significant hydroureter or signs of ureteral lithiasis.Diverticula arises off the dome of bladder measuring 1.4 cm. There is mild diffuse circumferential wall thickening of the bladder. Nodular enlargement of the prostate gland with mass effect upon the bladder base. Stomach/Bowel: Stomach appears within normal limits. The appendix is not confidently identified separate from the right lower quadrant bowel loops.: Appears largely decompressed beyond the level of the hepatic flexure. No significant bowel wall thickening, inflammation, or distension. Vascular/Lymphatic: Aortic atherosclerosis without aneurysm. No signs of abdominopelvic adenopathy. Reproductive: Prostate gland enlargement. Other: No free fluid identified within the abdomen or pelvis. No focal fluid collections. No signs of pneumatosis. Musculoskeletal: Marked multilevel degenerative disc disease within the lumbar spine. No acute or suspicious osseous findings. Review of the MIP images confirms the above findings. IMPRESSION: 1. No evidence for acute pulmonary embolus. 2. Multifocal  airspace opacities within the left lower lobe with extensive ground-glass opacities noted within the remaining portions of the lungs. Findings are concerning for multifocal pneumonia. 3. Small left pleural effusion. 4. No acute findings identified within the abdomen or pelvis. 5. Gallstone. 6. Prostate gland enlargement  with mass effect upon the bladder base. Mild diffuse circumferential wall thickening of the bladder may reflect chronic outlet obstruction and/or cystitis. Clinical correlation advised. 7. Aortic Atherosclerosis (ICD10-I70.0). Electronically Signed   By: Kerby Moors M.D.   On: 03/09/2022 12:09   CT Angio Chest PE W and/or Wo Contrast  Result Date: 03/09/2022 CLINICAL DATA:  Sepsis. High probability for pulmonary embolism. Weakness. EXAM: CT ANGIOGRAPHY CHEST CT ABDOMEN AND PELVIS WITH CONTRAST TECHNIQUE: Multidetector CT imaging of the chest was performed using the standard protocol during bolus administration of intravenous contrast. Multiplanar CT image reconstructions and MIPs were obtained to evaluate the vascular anatomy. Multidetector CT imaging of the abdomen and pelvis was performed using the standard protocol during bolus administration of intravenous contrast. RADIATION DOSE REDUCTION: This exam was performed according to the departmental dose-optimization program which includes automated exposure control, adjustment of the mA and/or kV according to patient size and/or use of iterative reconstruction technique. CONTRAST:  169m OMNIPAQUE IOHEXOL 350 MG/ML SOLN COMPARISON:  CT chest from 12/12/2020. CT chest, abdomen and pelvis 09/24/2020. FINDINGS: CTA CHEST FINDINGS Cardiovascular: Satisfactory opacification of the pulmonary arteries to the segmental level. No evidence of pulmonary embolism. Status post median sternotomy and CABG procedure. Aortic atherosclerotic calcifications. Normal heart size. No pericardial effusion. Mediastinum/Nodes: Thyroid gland and trachea demonstrate no significant findings. The esophagus appears diffusely patulous with mild diffuse circumferential wall thickening. The lumen of the esophagus is filled with fluid. No enlarged axillary, supraclavicular, mediastinal, or hilar lymph nodes. Lungs/Pleura: Small left pleural effusion. Multifocal multifocal airspace opacities are  identified within the left lower lobe with extensive ground-glass opacities noted within the remaining portions of the lungs. Imaging findings are concerning for multifocal pneumonia. No signs atelectasis or pneumothorax. Musculoskeletal: No chest wall abnormality. No acute or significant osseous findings. Review of the MIP images confirms the above findings. CT ABDOMEN and PELVIS FINDINGS Hepatobiliary: No suspicious focal liver abnormality. Small subcapsular cyst is noted overlying the right hepatic lobe measuring 1.1 cm. There is a 2.3 cm gallstone identified. No pericholecystic inflammation or bile duct dilatation noted. Pancreas: Unremarkable. No pancreatic ductal dilatation or surrounding inflammatory changes. Spleen: Normal in size without focal abnormality. Adrenals/Urinary Tract: Normal appearance of the adrenal glands. No nephrolithiasis, hydronephrosis or mass identified bilaterally. No significant hydroureter or signs of ureteral lithiasis.Diverticula arises off the dome of bladder measuring 1.4 cm. There is mild diffuse circumferential wall thickening of the bladder. Nodular enlargement of the prostate gland with mass effect upon the bladder base. Stomach/Bowel: Stomach appears within normal limits. The appendix is not confidently identified separate from the right lower quadrant bowel loops.: Appears largely decompressed beyond the level of the hepatic flexure. No significant bowel wall thickening, inflammation, or distension. Vascular/Lymphatic: Aortic atherosclerosis without aneurysm. No signs of abdominopelvic adenopathy. Reproductive: Prostate gland enlargement. Other: No free fluid identified within the abdomen or pelvis. No focal fluid collections. No signs of pneumatosis. Musculoskeletal: Marked multilevel degenerative disc disease within the lumbar spine. No acute or suspicious osseous findings. Review of the MIP images confirms the above findings. IMPRESSION: 1. No evidence for acute pulmonary  embolus. 2. Multifocal airspace opacities within the left lower lobe with extensive ground-glass opacities noted within the remaining portions of the  lungs. Findings are concerning for multifocal pneumonia. 3. Small left pleural effusion. 4. No acute findings identified within the abdomen or pelvis. 5. Gallstone. 6. Prostate gland enlargement with mass effect upon the bladder base. Mild diffuse circumferential wall thickening of the bladder may reflect chronic outlet obstruction and/or cystitis. Clinical correlation advised. 7. Aortic Atherosclerosis (ICD10-I70.0). Electronically Signed   By: Kerby Moors M.D.   On: 03/09/2022 12:09   DG Chest Port 1 View  Result Date: 03/09/2022 CLINICAL DATA:  Pt to ED via [facility] from home for generalized weakness and altered mental status. Pt son found pt on the toilet and pt was too weak to get up. Pt son reports pt was recently in the hospital for pneumonia. Pt having difficulty getting his words out. Pt is alert on arrival. Pt is tachypneic EXAM: PORTABLE CHEST 1 VIEW COMPARISON:  02/16/2022 and older studies. FINDINGS: Cardiac silhouette is normal in size and configuration. No mediastinal or hilar masses. Stable changes from prior CABG surgery. Prominent interstitial and bronchovascular markings, similar to the prior exams. No lung consolidation or convincing edema. No pleural effusion or pneumothorax. Skeletal structures are grossly intact. IMPRESSION: No acute cardiopulmonary disease. Electronically Signed   By: Lajean Manes M.D.   On: 03/09/2022 10:11   DG Chest Port 1 View  Result Date: 02/16/2022 CLINICAL DATA:  SHOB, hx of aspiration EXAM: PORTABLE CHEST 1 VIEW COMPARISON:  11/06/2021 FINDINGS: Stable cardiomediastinal contours status post sternotomy and CABG. Coarsened interstitial markings bilaterally with more patchy airspace opacity in the left mid to lower lung. No pleural effusion or pneumothorax. IMPRESSION: Patchy airspace opacity in the left mid to  lower lung, suspicious for pneumonia. Electronically Signed   By: Davina Poke D.O.   On: 02/16/2022 10:36    Microbiology: Results for orders placed or performed during the hospital encounter of 03/09/22  Resp Panel by RT-PCR (Flu A&B, Covid) Anterior Nasal Swab     Status: None   Collection Time: 03/09/22  9:35 AM   Specimen: Anterior Nasal Swab  Result Value Ref Range Status   SARS Coronavirus 2 by RT PCR NEGATIVE NEGATIVE Final    Comment: (NOTE) SARS-CoV-2 target nucleic acids are NOT DETECTED.  The SARS-CoV-2 RNA is generally detectable in upper respiratory specimens during the acute phase of infection. The lowest concentration of SARS-CoV-2 viral copies this assay can detect is 138 copies/mL. A negative result does not preclude SARS-Cov-2 infection and should not be used as the sole basis for treatment or other patient management decisions. A negative result may occur with  improper specimen collection/handling, submission of specimen other than nasopharyngeal swab, presence of viral mutation(s) within the areas targeted by this assay, and inadequate number of viral copies(<138 copies/mL). A negative result must be combined with clinical observations, patient history, and epidemiological information. The expected result is Negative.  Fact Sheet for Patients:  EntrepreneurPulse.com.au  Fact Sheet for Healthcare Providers:  IncredibleEmployment.be  This test is no t yet approved or cleared by the [place] FDA and  has been authorized for detection and/or diagnosis of SARS-CoV-2 by FDA under an Emergency Use Authorization (EUA). This EUA will remain  in effect (meaning this test can be used) for the duration of the COVID-19 declaration under Section 564(b)(1) of the Act, 21 U.S.C.section 360bbb-3(b)(1), unless the authorization is terminated  or revoked sooner.       Influenza A by PCR NEGATIVE NEGATIVE Final   Influenza B by  PCR NEGATIVE NEGATIVE Final    Comment: (  NOTE) The Xpert Xpress SARS-CoV-2/FLU/RSV plus assay is intended as an aid in the diagnosis of influenza from Nasopharyngeal swab specimens and should not be used as a sole basis for treatment. Nasal washings and aspirates are unacceptable for Xpert Xpress SARS-CoV-2/FLU/RSV testing.  Fact Sheet for Patients: EntrepreneurPulse.com.au  Fact Sheet for Healthcare Providers: IncredibleEmployment.be  This test is not yet approved or cleared by the [place] FDA and has been authorized for detection and/or diagnosis of SARS-CoV-2 by FDA under an Emergency Use Authorization (EUA). This EUA will remain in effect (meaning this test can be used) for the duration of the COVID-19 declaration under Section 564(b)(1) of the Act, 21 U.S.C. section 360bbb-3(b)(1), unless the authorization is terminated or revoked.  Performed at [facility], [address] [place], [place] [address]   Blood Culture (routine x 2)     Status: None (Preliminary result)   Collection Time: 03/09/22  9:35 AM   Specimen: BLOOD  Result Value Ref Range Status   Specimen Description BLOOD LEFT ANTECUBITAL  Final   Special Requests   Final    BOTTLES DRAWN AEROBIC AND ANAEROBIC Blood Culture results may not be optimal due to an inadequate volume of blood received in culture bottles   Culture   Final    NO GROWTH 2 DAYS Performed at [facility], [address] [place], [place] [address]    Report Status PENDING  Incomplete  Blood Culture (routine x 2)     Status: None (Preliminary result)   Collection Time: 03/09/22  9:41 AM   Specimen: BLOOD  Result Value Ref Range Status   Specimen Description BLOOD RIGHT ARM  Final   Special Requests   Final    BOTTLES DRAWN AEROBIC AND ANAEROBIC Blood Culture results may not be optimal due to an excessive volume of blood received in culture bottles   Culture   Final    NO  GROWTH 2 DAYS Performed at [facility], [address] [place], [place] [address]    Report Status PENDING  Incomplete    Labs: CBC: Recent Labs  Lab 03/09/22 0935 03/10/22 0718  WBC 8.7 7.4  NEUTROABS 8.2*  --   HGB 10.1* 9.7*  HCT 31.1* 30.2*  MCV 100.3* 99.7  PLT 156 975*   Basic Metabolic Panel: Recent Labs  Lab 03/09/22 0935 03/10/22 0718  NA 141 141  K 3.5 3.3*  CL 111 109  CO2 22 22  GLUCOSE 110* 90  BUN 20 17  CREATININE 1.31* 1.32*  CALCIUM 9.0 8.6*   Liver Function Tests: Recent Labs  Lab 03/09/22 0935  AST 21  ALT 8  ALKPHOS 59  BILITOT 0.7  PROT 6.7  ALBUMIN 3.8     Discharge time spent: greater than 30 minutes.  Signed: Loletha Grayer, MD [facility] 03/11/2022

## 2022-03-11 NOTE — TOC Transition Note (Signed)
Transition of Care [facility]) - CM/SW Discharge Note   Patient Details  Name: Frank Mcdaniel MRN: [id number] Date of Birth: 1933-06-17  [facility]) CM/SW Contact:  Candie Chroman, LCSW Phone Number: 03/11/2022, 8:50 AM   Clinical Narrative:  Patient has orders to discharge home today. Son does not feel that home health PT would be beneficial at this time. He states that patient is typically fairly independent at home with his caregivers. He will contact patient's PCP if he changes his mind later on. No further concerns. CSW signing off.   Final next level of care: Home/Self Care (with PCS) Barriers to Discharge: Barriers Resolved   Patient Goals and CMS Choice Patient states their goals for this hospitalization and ongoing recovery are:: Home with home health CMS Medicare.gov Compare Post Acute Care list provided to:: Patient Represenative (must comment) Choice offered to / list presented to : [place] / Guardian  Discharge Placement                Patient to be transferred to facility by: Son Name of family member notified: Rameses Ou Patient and family notified of of transfer: 03/11/22  Discharge Plan and Services     Post Acute Care Choice: Home Health                               Social Determinants of Health (SDOH) Interventions     Readmission Risk Interventions    11/14/2020   11:46 AM 11/14/2020   11:21 AM 11/10/2019    1:04 PM  Readmission Risk Prevention Plan  Transportation Screening Complete  Complete  PCP or Specialist Appt within 3-5 Days Complete  Complete  HRI or Home Care Consult Complete  Complete  Social Work Consult for [place] Planning/Counseling Complete    Palliative Care Screening Not Applicable Not Applicable   Medication Review Press photographer) Complete  Complete

## 2022-03-11 NOTE — Plan of Care (Signed)
Patient discharged to home with son.  Reviewed and discharge packet given to son who verbalized understanding.  Pivs x2 removed with tip intact.

## 2022-03-12 ENCOUNTER — Telehealth: Payer: Self-pay

## 2022-03-12 DIAGNOSIS — F0390 Unspecified dementia without behavioral disturbance: Secondary | ICD-10-CM | POA: Diagnosis not present

## 2022-03-12 DIAGNOSIS — J690 Pneumonitis due to inhalation of food and vomit: Secondary | ICD-10-CM | POA: Diagnosis not present

## 2022-03-12 NOTE — Telephone Encounter (Signed)
Transition Care Management Unsuccessful Follow-up Telephone Call  Date of discharge and from where:  03/11/22  Attempts:  1st Attempt  Reason for unsuccessful TCM follow-up call:  Unable to reach patient. Will follow.

## 2022-03-13 NOTE — Telephone Encounter (Signed)
Transition Care Management Unsuccessful Follow-up Telephone Call  Date of discharge and from where:  03/11/22 [facility]  Attempts:  2nd Attempt  Reason for unsuccessful TCM follow-up call:  Left voice message. HFU scheduled 03/18/22 @ 11:00. Keep all scheduled appointments.

## 2022-03-14 LAB — CULTURE, BLOOD (ROUTINE X 2)
Culture: NO GROWTH
Culture: NO GROWTH

## 2022-03-14 NOTE — Telephone Encounter (Signed)
Transition Care Management Unsuccessful Follow-up Telephone Call  Date of discharge and from where:  03/11/22 [facility]  Attempts:  3rd Attempt  Reason for unsuccessful TCM follow-up call:  Left voice message. HFU scheduled 03/18/22 @ 11:00. Keep all scheduled appointments. TCM qualified. No further telephone outreach at this time.

## 2022-03-18 ENCOUNTER — Ambulatory Visit: Payer: PPO

## 2022-03-18 ENCOUNTER — Ambulatory Visit (INDEPENDENT_AMBULATORY_CARE_PROVIDER_SITE_OTHER): Payer: PPO | Admitting: Family Medicine

## 2022-03-18 ENCOUNTER — Encounter: Payer: Self-pay | Admitting: Family Medicine

## 2022-03-18 VITALS — BP 118/70 | HR 54 | Temp 99.2°F | Ht 69.0 in | Wt 169.0 lb

## 2022-03-18 DIAGNOSIS — F419 Anxiety disorder, unspecified: Secondary | ICD-10-CM | POA: Diagnosis not present

## 2022-03-18 DIAGNOSIS — R1314 Dysphagia, pharyngoesophageal phase: Secondary | ICD-10-CM | POA: Diagnosis not present

## 2022-03-18 DIAGNOSIS — T148XXA Other injury of unspecified body region, initial encounter: Secondary | ICD-10-CM | POA: Insufficient documentation

## 2022-03-18 DIAGNOSIS — S301XXA Contusion of abdominal wall, initial encounter: Secondary | ICD-10-CM | POA: Diagnosis not present

## 2022-03-18 DIAGNOSIS — D649 Anemia, unspecified: Secondary | ICD-10-CM | POA: Diagnosis not present

## 2022-03-18 DIAGNOSIS — J690 Pneumonitis due to inhalation of food and vomit: Secondary | ICD-10-CM | POA: Diagnosis not present

## 2022-03-18 DIAGNOSIS — F0390 Unspecified dementia without behavioral disturbance: Secondary | ICD-10-CM | POA: Diagnosis not present

## 2022-03-18 LAB — CBC
HCT: 30.1 % — ABNORMAL LOW (ref 39.0–52.0)
Hemoglobin: 9.8 g/dL — ABNORMAL LOW (ref 13.0–17.0)
MCHC: 32.5 g/dL (ref 30.0–36.0)
MCV: 100.8 fl — ABNORMAL HIGH (ref 78.0–100.0)
Platelets: 223 10*3/uL (ref 150.0–400.0)
RBC: 2.98 Mil/uL — ABNORMAL LOW (ref 4.22–5.81)
RDW: 15.8 % — ABNORMAL HIGH (ref 11.5–15.5)
WBC: 6 10*3/uL (ref 4.0–10.5)

## 2022-03-18 LAB — BASIC METABOLIC PANEL
BUN: 15 mg/dL (ref 6–23)
CO2: 27 mEq/L (ref 19–32)
Calcium: 9 mg/dL (ref 8.4–10.5)
Chloride: 106 mEq/L (ref 96–112)
Creatinine, Ser: 1.41 mg/dL (ref 0.40–1.50)
GFR: 44.8 mL/min — ABNORMAL LOW (ref >60.00)
Glucose, Bld: 84 mg/dL (ref 70–99)
Potassium: 3.8 mEq/L (ref 3.5–5.1)
Sodium: 140 mEq/L (ref 135–145)

## 2022-03-18 MED ORDER — SERTRALINE HCL 100 MG PO TABS: 150.0000 mg | ORAL_TABLET | Freq: Every day | ORAL | 1 refills | Status: DC

## 2022-03-18 NOTE — Assessment & Plan Note (Signed)
Patient continues to have cough despite what his son does for his foods.  Discussed potential for seeing GI given that its been greater than a year since he is followed up with them.  Referral placed.

## 2022-03-18 NOTE — Progress Notes (Signed)
[name], MD Phone: [phone]  Frank Mcdaniel is a 87 y.o. male who presents today for follow-up.  Pneumonia/sepsis: Patient was hospitalized after being found to have a fever and having difficulty getting off of the toilet.  He was found to have multifocal pneumonia likely related to aspiration.  He was treated with IV antibiotics and transition to Augmentin and azithromycin on discharge.  He had metabolic encephalopathy with this.  His son reports he has returned to his baseline.  He has chronic cough related to aspiration.  He notes the inpatient team suggested palliative care.  His son has a hard time sticking strictly to a dysphagia diet though he notes the patient coughs regardless of how he is fed.  He does have some sneezing with the coughing.  Bruising: Patient had a bruise on his stomach after he left the hospital.  He had a red area on his left forearm that resolved after leaving the hospital.  He was getting Lovenox in the hospital.  Social History   Tobacco Use  Smoking Status Never  Smokeless Tobacco Never    Current Outpatient Medications on File Prior to Visit  Medication Sig Dispense Refill   aspirin EC 81 MG tablet Take 1 tablet (81 mg total) by mouth daily. Swallow whole. 90 tablet 3   atorvastatin (LIPITOR) 40 MG tablet TAKE 1/2 TABLETS (20 MG TOTAL) BY MOUTH DAILY. 45 tablet 3   azithromycin (ZITHROMAX) 250 MG tablet One tab po daily for two days 2 each 0   calcium carbonate (TUMS EX) 750 MG chewable tablet Chew 1 tablet by mouth as needed (heartburn).     famotidine (PEPCID) 20 MG tablet TAKE 1 TABLET BY MOUTH EVERY DAY 90 tablet 3   finasteride (PROSCAR) 5 MG tablet TAKE 1 TABLET BY MOUTH EVERY DAY 90 tablet 3   hydrOXYzine (ATARAX) 10 MG tablet Take 10 mg by mouth 3 (three) times daily as needed for anxiety.     isosorbide mononitrate (IMDUR) 30 MG 24 hr tablet Take 1 tablet (30 mg total) by mouth daily. 90 tablet 3   memantine (NAMENDA) 5 MG tablet  TAKE 1 TABLET BY MOUTH TWICE A DAY 180 tablet 1   Multiple Vitamin (MULTIVITAMIN) tablet Take 1 tablet by mouth daily.     pantoprazole (PROTONIX) 40 MG tablet TAKE 1 TABLET BY MOUTH EVERY DAY (Patient taking differently: Take 40 mg by mouth every evening.) 90 tablet 1   tamsulosin (FLOMAX) 0.4 MG CAPS capsule Take 0.8 mg by mouth every evening.     No current facility-administered medications on file prior to visit.     ROS see history of present illness  Objective  Physical Exam Vitals:   03/18/22 1107  BP: 118/70  Pulse: (!) 54  Temp: 99.2 F (37.3 C)  SpO2: 95%    BP Readings from Last 3 Encounters:  03/18/22 118/70  03/11/22 (!) 144/61  02/17/22 (!) 134/47   Wt Readings from Last 3 Encounters:  03/18/22 169 lb (76.7 kg)  03/09/22 182 lb 8.7 oz (82.8 kg)  02/16/22 160 lb (72.6 kg)    Physical Exam Constitutional:      General: He is not in acute distress.    Appearance: He is not diaphoretic.  Cardiovascular:     Rate and Rhythm: Normal rate and regular rhythm.     Heart sounds: Normal heart sounds.  Pulmonary:     Effort: Pulmonary effort is normal.     Breath sounds: Normal breath sounds.  Abdominal:    Skin:    General: Skin is warm and dry.  Neurological:     Mental Status: He is alert.      Assessment/Plan: Please see individual problem list.  Problem List Items Addressed This Visit     Anxiety (Chronic)   Relevant Medications   sertraline (ZOLOFT) 100 MG tablet   Dementia without behavioral disturbance (HCC) (Chronic)   Relevant Medications   sertraline (ZOLOFT) 100 MG tablet   Pharyngoesophageal phase dysphagia (Chronic)    Patient continues to have cough despite what his son does for his foods.  Discussed potential for seeing GI given that its been greater than a year since he is followed up with them.  Referral placed.      Relevant Orders   Ambulatory referral to Gastroenterology   Anemia   Relevant Orders   CBC   Aspiration  pneumonia ([place]) - Primary    Patient continues to have intermittent issues with aspiration followed by development of pneumonia.  We are going to refer for palliative care.  He does have issues with dysphagia and based on speech therapy evaluation he is high risk for aspiration.      Relevant Orders   DG Chest 2 View   Amb Referral to Palliative Care   Basic Metabolic Panel (BMET)   Bruise    On stomach. This is likely related to the DVT prophylaxis with lovenox in the hospital.        Return in about 1 month (around 04/18/2022) for cxr, 3 months pcp.  I have spent 31 minutes in the care of this patient regarding chart review, history taking, documentation, completion of exam, discussion of plan, placing orders.   [name], MD [place]

## 2022-03-18 NOTE — Assessment & Plan Note (Signed)
On stomach. This is likely related to the DVT prophylaxis with lovenox in the hospital.

## 2022-03-18 NOTE — Assessment & Plan Note (Signed)
Patient continues to have intermittent issues with aspiration followed by development of pneumonia.  We are going to refer for palliative care.  He does have issues with dysphagia and based on speech therapy evaluation he is high risk for aspiration.

## 2022-03-18 NOTE — Patient Instructions (Signed)
Nice to see you. I placed a palliative care referral.  If you do not hear from them in the next 1 to 2 weeks please let me know. I placed a GI referral as well.  I think it worthwhile seeing them to see if they want to do another endoscopy or see if they have any additional thoughts on management of this issue. We will get lab work today.

## 2022-03-22 DIAGNOSIS — K219 Gastro-esophageal reflux disease without esophagitis: Secondary | ICD-10-CM | POA: Diagnosis not present

## 2022-03-22 DIAGNOSIS — E785 Hyperlipidemia, unspecified: Secondary | ICD-10-CM | POA: Diagnosis not present

## 2022-03-22 DIAGNOSIS — F01518 Vascular dementia, unspecified severity, with other behavioral disturbance: Secondary | ICD-10-CM | POA: Diagnosis not present

## 2022-03-22 DIAGNOSIS — Z66 Do not resuscitate: Secondary | ICD-10-CM | POA: Diagnosis not present

## 2022-03-22 DIAGNOSIS — Z515 Encounter for palliative care: Secondary | ICD-10-CM | POA: Diagnosis not present

## 2022-03-26 ENCOUNTER — Encounter: Payer: Self-pay | Admitting: Family Medicine

## 2022-03-26 NOTE — Telephone Encounter (Signed)
[facility] has been trying to contact the patient for the appointment. Can you send his son the number for [facility] so they can call to set up a visit? Thanks.

## 2022-04-09 ENCOUNTER — Other Ambulatory Visit: Payer: PPO | Admitting: Nurse Practitioner

## 2022-04-09 ENCOUNTER — Encounter: Payer: Self-pay | Admitting: Nurse Practitioner

## 2022-04-09 DIAGNOSIS — Z515 Encounter for palliative care: Secondary | ICD-10-CM

## 2022-04-09 DIAGNOSIS — F0390 Unspecified dementia without behavioral disturbance: Secondary | ICD-10-CM | POA: Diagnosis not present

## 2022-04-09 DIAGNOSIS — R630 Anorexia: Secondary | ICD-10-CM | POA: Diagnosis not present

## 2022-04-09 DIAGNOSIS — R1319 Other dysphagia: Secondary | ICD-10-CM

## 2022-04-09 NOTE — Progress Notes (Signed)
[facility] Palliative Care Consult Note Telephone: [phone]  Fax: [phone]   Date of encounter: 04/09/22 3:45 PM PATIENT NAME: Frank Mcdaniel [address] [place] [address]   [phone] (home)  DOB: 25-Nov-1932 MRN: [id number] PRIMARY CARE PROVIDER:    [name], MD,  [address] [place] [address] [phone]  REFERRING PROVIDER:   [name], MD [address] [place],  [place] [address] [phone]  RESPONSIBLE PARTY:   Yedidya, Duddy I met face to face with patient and family in home. Palliative Care was asked to follow this patient by consultation request of  [name], MD to address advance care planning and complex medical decision making. This is the initial visit. ASSESSMENT AND PLAN / RECOMMENDATIONS:   Advance Care Planning/Goals of Care: Goals include to maximize quality of life and symptom management. Patient/health care surrogate gave his/her permission to discuss.Our advance care planning conversation included a discussion about:    The value and importance of advance care planning  Experiences with loved ones who have been seriously ill or have died  Exploration of personal, cultural or spiritual beliefs that might influence medical decisions  Exploration of goals of care in the event of a sudden injury or illness  Identification  of a healthcare agent  Review and updating or creation of an  advance directive document . Decision not to resuscitate or to de-escalate disease focused treatments due to poor prognosis. CODE STATUS: DNR  Symptom Management/Plan: 1. Advance Care Planning;  DNR; golden rod completed, wishes are for focus to be comfort at home. Talked about Hospice benefit under Medicare program. Frank Mcdaniel endorses he wishes to proceed with Hospice evaluation;  Hairy Cell Leukemia Worsening dysphagia secondary to Dementia with anorexia; weight loss;   1 year ago PPS 60% Currently  PPS (high) 40% Worsening dysphagia; recurrent PNA's; (1 ED visit; 2 hospitalization for PNA's sepsis in last 3 months).   2. Anorexia with worsening dysphagia; recurrent PNA's; (1 ED visit; 2 hospitalization for PNA's sepsis in last 3 months). Son wishes for comfort care at home. Discussed diet, supplements, weights.  Relatively stable 03/18/2022 weight 169 lbs  CAD:. 2000 s/p CABG;  b. 10/2008 Neg MV, EF 64%; c. cath 05/2015: LM 85% sev cal, pLAD-1 lesion 80%, pLAD-2 lesion 100% chronic, ostLCx 70% sev cal, OM4 90%, ostRCA 70%, mid RCA 90%, LIMA-LAD patent, VG-OM2 patent, VG-OM3 patent, VG-RPDA patent. No AS   03/18/2022 WBC 6.0; Hgb 9.8; HCT 30.1; platelets 223; sodium 140; potassium 3.8; chloride 106; Co2 27; calcium 9.0; bun 15; creatinine 1.41; GFR 49  3. Dementia; progressive with overall cognitive/functional decline. Gait is shuffling, requires assistance with ADL's, feeds himself with declined appetite. Follows his son around the house. Per Frank Mcdaniel, he is always looking for him, asking same repetitive questions. Unable to participate in therapy as unable to retain information or goals.   4. Goals of Care: Goals include to maximize quality of life and symptom management. Our advance care planning conversation included a discussion about:    The value and importance of advance care planning  Exploration of personal, cultural or spiritual beliefs that might influence medical decisions  Exploration of goals of care in the event of a sudden injury or illness  Identification and preparation of a healthcare agent  Review and updating or creation of an advance directive document.  5. Palliative care encounter; Palliative care encounter; Palliative medicine team will continue to support  patient, patient's family, and medical team. Visit consisted of counseling and education dealing with the complex and emotionally intense issues of symptom management and palliative care  in the setting of serious and potentially life-threatening illness  Follow up Palliative Care Visit: Palliative care will continue to follow for complex medical decision making, advance care planning, and clarification of goals.   I spent 61 minutes providing this consultation. More than 50% of the time in this consultation was spent in counseling and care coordination. PPS:  Chief Complaint: Initial palliative consult for complex medical decision making, address goals, manage ongoing symptoms  HISTORY OF PRESENT ILLNESS:  Frank Mcdaniel is a 88 y.o. year old male  with multiple medical problems including dementia with dysphagia, recurrent aspiration pna's, hairy cell leukemia, CAD, anemia, asthma, BPH, CKD, GERD, HLD, anxiety. Recent hospitalization 03/09/2022 to 03/11/2022 for aspiration PNA sepsis with acute metabolic encephalopathy. Treated with abx, recurrent dysphagia with worsening swallowing with recurrent pna's. High risk for aspiration. I called Frank Mcdaniel and confirmed PC visit appointment. I visited Frank Mcdaniel and Frank. Mcdaniel in their home. Frank Mcdaniel spoke with me prior to seeing Frank Mcdaniel concerning the last time he was independent, past medical history, ros, functional and cognitive decline, family and social history, medical goals reviewed. We talked about code status as he is a DNR, golden rod completed. Frank Mcdaniel is HCPOA and only living relative. We talked about role pc in poc. We talked about Hospice benefit under Medicare program. We talked about what services Hospice provides, what qualifies someone for Hospice. Frank Mcdaniel wishes to continue with Hospice AV visit, We talked about f/u treatments for Hairy Cell Leukemia which Frank Mcdaniel endorses he does not believe he will further pursue any further interventions with cognitive and functional impairment/decline. Focus is on quality of life. Discussed if Frank Mcdaniel is not admitted to Hospice will f/u with [facility] visit. Frank Mcdaniel in agreement, notified Dr Caryl Bis of request for  Hospice order.   History obtained from review of EMR, discussion with son Frank Mcdaniel with Frank. Gruenhagen.  I reviewed available labs, medications, imaging, studies and related documents from the EMR.  Records reviewed and summarized above.   ROS 10 point system reviewed all negative except HPI  Physical Exam: Constitutional: NAD General: frail appearing, thin, pleasant male EYES: lids intact ENMT: oral mucous membranes moist CV: S1S2, RRR Pulmonary: LCTA, room air Abdomen: soft and non tender MSK: moves all extremities, +muscle wasting Skin: warm and dry Neuro:  + generalized weakness,  +shuffling gait; + cognitive impairment Psych: non-anxious affect, A and O to person  CURRENT PROBLEM LIST:  Patient Active Problem List   Diagnosis Date Noted   Bruise 03/18/2022   Hypokalemia 35/36/1443   Acute metabolic encephalopathy 15/40/0867   Overweight (BMI 25.0-29.9) 02/16/2022   Pharyngoesophageal phase dysphagia 04/17/2021   Aspiration pneumonia ([place]) 11/27/2020   Fall 11/13/2020   CKD (chronic kidney disease), stage IIIa 02/28/2020   IBS (irritable bowel syndrome) 08/02/2019   Dementia without behavioral disturbance ([place]) 05/28/2019   Dysphagia    Constipation 05/21/2016   Bilateral low back pain without sciatica 12/21/2015   Anxiety 09/11/2015   Anemia 09/01/2015   GERD (gastroesophageal reflux disease) 06/02/2015   Asthma 01/28/2015   BPH (benign prostatic hyperplasia) 01/28/2015   Hairy cell leukemia ([place]) 01/28/2015   Coronary artery disease    S/P CABG (coronary artery bypass graft) 11/08/2010   HTN (hypertension) 11/08/2010   Hyperlipidemia 10/27/2009   PAST MEDICAL HISTORY:  Active Ambulatory Problems    Diagnosis Date  Noted   Hyperlipidemia 10/27/2009   S/P CABG (coronary artery bypass graft) 11/08/2010   HTN (hypertension) 11/08/2010   Coronary artery disease    Asthma 01/28/2015   BPH (benign prostatic hyperplasia) 01/28/2015   Hairy cell leukemia ([place]) 01/28/2015    GERD (gastroesophageal reflux disease) 06/02/2015   Anemia 09/01/2015   Anxiety 09/11/2015   Bilateral low back pain without sciatica 12/21/2015   Constipation 05/21/2016   Dysphagia    Dementia without behavioral disturbance ([place]) 05/28/2019   IBS (irritable bowel syndrome) 08/02/2019   CKD (chronic kidney disease), stage IIIa 02/28/2020   Fall 11/13/2020   Aspiration pneumonia ([place]) 11/27/2020   Pharyngoesophageal phase dysphagia 04/17/2021   Overweight (BMI 25.0-29.9) 70/62/3762   Acute metabolic encephalopathy 83/15/1761   Hypokalemia 03/10/2022   Bruise 03/18/2022   Resolved Ambulatory Problems    Diagnosis Date Noted   Bradycardia 11/08/2010   Chest pain 09/02/2014   Acute respiratory failure with hypoxemia ([place]) 01/28/2015   Hypoxia 02/01/2015   Angina pectoris ([place]) 05/26/2015   Borderline low oxygen saturation level 10/26/2015   Chronic fatigue 12/21/2015   Chronic nausea 02/15/2016   Memory loss or impairment 02/15/2016   Left inguinal hernia 06/05/2016   Status post inguinal hernia repair 09/13/2016   Esophageal obstruction due to food impaction    Foreign body in esophagus    Stricture and stenosis of esophagus    Bilateral lower extremity edema 08/12/2017   Laceration of right thumb without foreign body without damage to nail 08/31/2018   Closed fracture of tuft of distal phalanx of finger 08/31/2018   AKI (acute kidney injury) ([place]) 09/09/2018   Goals of care, counseling/discussion 10/18/2019   Neutropenic fever ([place]) 11/08/2019   Abnormal EKG 11/23/2019   C. difficile diarrhea 12/31/2019   BRBPR (bright red blood per rectum) 12/31/2019   Pancytopenia ([place]) 01/07/2020   Febrile neutropenia ([place]) 07/18/2020   Palliative care encounter    Community acquired pneumonia 08/10/2020   UTI (urinary tract infection) 08/10/2020   Thickening of esophagus    Sepsis ([place]) 09/24/2020   Pulmonary nodule 09/24/2020   HCAP (healthcare-associated pneumonia) 11/13/2020    Elevated troponin 11/13/2020   Cellulitis of face 10/15/2021   Cerumen impaction 10/15/2021   Viral illness 11/07/2021   Sepsis ([place]) 03/09/2022   Past Medical History:  Diagnosis Date   Arthritis    Atypical chest pain    H/O echocardiogram    HOH (hard of hearing)    Hypertension    Leukemia ([place])    SOCIAL HX:  Social History   Tobacco Use   Smoking status: Never   Smokeless tobacco: Never  Substance Use Topics   Alcohol use: No   FAMILY HX:  Family History  Problem Relation Age of Onset   Hypertension Mother    Arthritis Father    CAD Father    Other Other        no premature CAD.   Colon cancer Neg Hx    Prostate cancer Neg Hx    Stomach cancer Neg Hx       ALLERGIES: No Known Allergies   PERTINENT MEDICATIONS:  Outpatient Encounter Medications as of 04/09/2022  Medication Sig   aspirin EC 81 MG tablet Take 1 tablet (81 mg total) by mouth daily. Swallow whole.   atorvastatin (LIPITOR) 40 MG tablet TAKE 1/2 TABLETS (20 MG TOTAL) BY MOUTH DAILY.   azithromycin (ZITHROMAX) 250 MG tablet One tab po daily for two days   calcium carbonate (TUMS EX)  750 MG chewable tablet Chew 1 tablet by mouth as needed (heartburn).   famotidine (PEPCID) 20 MG tablet TAKE 1 TABLET BY MOUTH EVERY DAY   finasteride (PROSCAR) 5 MG tablet TAKE 1 TABLET BY MOUTH EVERY DAY   hydrOXYzine (ATARAX) 10 MG tablet Take 10 mg by mouth 3 (three) times daily as needed for anxiety.   isosorbide mononitrate (IMDUR) 30 MG 24 hr tablet Take 1 tablet (30 mg total) by mouth daily.   memantine (NAMENDA) 5 MG tablet TAKE 1 TABLET BY MOUTH TWICE A DAY   Multiple Vitamin (MULTIVITAMIN) tablet Take 1 tablet by mouth daily.   pantoprazole (PROTONIX) 40 MG tablet TAKE 1 TABLET BY MOUTH EVERY DAY (Patient taking differently: Take 40 mg by mouth every evening.)   sertraline (ZOLOFT) 100 MG tablet Take 1.5 tablets (150 mg total) by mouth daily.   tamsulosin (FLOMAX) 0.4 MG CAPS capsule Take 0.8 mg by mouth  every evening.   No facility-administered encounter medications on file as of 04/09/2022.   Thank you for the opportunity to participate in the care of Frank. Levitz.  The palliative care team will continue to follow. Please call our office at [phone] if we can be of additional assistance.   Susie Pousson Z Pearly Apachito, NP ,

## 2022-04-10 ENCOUNTER — Other Ambulatory Visit: Payer: Self-pay | Admitting: Family

## 2022-04-10 ENCOUNTER — Encounter: Payer: Self-pay | Admitting: Family Medicine

## 2022-04-10 DIAGNOSIS — Z5320 Procedure and treatment not carried out because of patient's decision for unspecified reasons: Secondary | ICD-10-CM

## 2022-04-17 ENCOUNTER — Other Ambulatory Visit: Payer: Self-pay | Admitting: Family Medicine

## 2022-04-17 DIAGNOSIS — F419 Anxiety disorder, unspecified: Secondary | ICD-10-CM

## 2022-04-17 DIAGNOSIS — F0390 Unspecified dementia without behavioral disturbance: Secondary | ICD-10-CM

## 2022-04-18 ENCOUNTER — Encounter: Payer: Self-pay | Admitting: Oncology

## 2022-04-19 ENCOUNTER — Telehealth: Payer: Self-pay | Admitting: Family Medicine

## 2022-04-19 ENCOUNTER — Encounter: Payer: Self-pay | Admitting: Family Medicine

## 2022-04-19 ENCOUNTER — Telehealth: Payer: Self-pay | Admitting: Oncology

## 2022-04-19 DIAGNOSIS — R1310 Dysphagia, unspecified: Secondary | ICD-10-CM

## 2022-04-19 DIAGNOSIS — C9141 Hairy cell leukemia, in remission: Secondary | ICD-10-CM

## 2022-04-19 DIAGNOSIS — R1314 Dysphagia, pharyngoesophageal phase: Secondary | ICD-10-CM

## 2022-04-19 DIAGNOSIS — F0390 Unspecified dementia without behavioral disturbance: Secondary | ICD-10-CM

## 2022-04-19 NOTE — Telephone Encounter (Signed)
Referral to hospice placed.  

## 2022-04-19 NOTE — Telephone Encounter (Signed)
Patients son [name] called to let us know patient has been  admitted to Hospice. They would like to keep the upcoming appointment with Judson Roch. While they are here they would like to talk about Palliative care. The Hospice Nurse asked them had they ever seen Josh and thought he would be a good resource. [name] asked me to give a heads up so maybe Merrily Pew can be available. If you would like to speak to [name] his number is [phone].

## 2022-04-19 NOTE — Telephone Encounter (Signed)
----- Message from Ashley Jacobs sent at 04/18/2022  3:14 PM EDT ----- Regarding: RE: [place], I can't generate the order only the provider. Dr Caryl Bis please submit The order just has to say Hospice referral for evaluation Thank you!  ----- Message ----- From: Shawn Stall, NP Sent: 04/18/2022   3:00 PM EDT To: [name], MD; Ashley Jacobs; # Subject: RE: Hospice                                    Good afternoon. Happy Thursday. The order just has to say Hospice referral for evaluation. I think they have admitted him already though. You may call [phone] and ask to speak with Referral intake department, they can let you know if you need to send the order. I hope you have a nice afternoon. Christin  ----- Message ----- From: Esau Grew R Sent: 04/18/2022   2:57 PM EDT To: Shawn Stall, NP; [name], MD; # Subject: RE: Hospice                                    Good afternoon!  Happy Thursday!  What will the order say? The only order I see is for xray. The referral to palliative was sent. Please advise and Thank you!  ----- Message ----- From: [name], MD Sent: 04/16/2022   1:17 PM EDT To: Shawn Stall, NP; Ashley Jacobs; # Subject: FW: Hospice                                    Rasheedah, can you check on the order for this that Padonda placed while I was out of the office? Thanks.  ----- Message ----- From: Shawn Stall, NP Sent: 04/16/2022   1:00 PM EDT To: Kennyth Arnold, FNP; [name], MD Subject: RE: [facility] has not received the order, would you be able to resent it to [id number]? Also the son would like to cancel the GI appointment consult referral. Thank you Christin Gusler NP [phone] ----- Message ----- From: Kennyth Arnold, FNP Sent: 04/10/2022   8:04 AM EDT To: Shawn Stall, NP; [name], MD Subject:  RE: Hospice                                    Order placed ----- Message ----- From: Shawn Stall, NP Sent: 04/09/2022   2:30 PM EDT To: [name], MD Subject: Hospice                                        Hi Dr Caryl Bis Can you please fax a hospice order to [phone]. I completed the palliative initial consult. I think he would qualify from the overall decline functionally, cognitively, weight loss and the son [name] wishes to have Hospice evaluate him for  services.  Thank you  Christin

## 2022-04-19 NOTE — Telephone Encounter (Signed)
Frank Mcdaniel is ok with changing the apt to his schedule.

## 2022-04-19 NOTE — Telephone Encounter (Signed)
Noted.  It would be great if the patient can keep the visit so we can follow-up though if they prefer to push it further out we could certainly do that.

## 2022-04-22 NOTE — Telephone Encounter (Signed)
If possible it would be good for them to keep the appointment, though if they prefer not to come in that is fine and he could be rescheduled for a month or so out from now.

## 2022-04-22 NOTE — Telephone Encounter (Signed)
I called and spoke with the patients son and he stated he would let me know if he wants to cancel his appointment tomorrow after he speaks to the hospice nurse.  Yanin Muhlestein,cma

## 2022-04-23 ENCOUNTER — Other Ambulatory Visit: Payer: Self-pay

## 2022-04-23 ENCOUNTER — Encounter: Payer: Self-pay | Admitting: Hospice and Palliative Medicine

## 2022-04-23 ENCOUNTER — Inpatient Hospital Stay: Payer: PPO | Attending: Oncology

## 2022-04-23 ENCOUNTER — Ambulatory Visit: Payer: PPO | Admitting: Oncology

## 2022-04-23 ENCOUNTER — Inpatient Hospital Stay: Payer: PPO | Admitting: Hospice and Palliative Medicine

## 2022-04-23 VITALS — BP 140/60 | HR 54 | Temp 97.6°F | Resp 18

## 2022-04-23 DIAGNOSIS — C9140 Hairy cell leukemia not having achieved remission: Secondary | ICD-10-CM | POA: Diagnosis present

## 2022-04-23 DIAGNOSIS — D61818 Other pancytopenia: Secondary | ICD-10-CM | POA: Diagnosis not present

## 2022-04-23 DIAGNOSIS — N1831 Chronic kidney disease, stage 3a: Secondary | ICD-10-CM | POA: Insufficient documentation

## 2022-04-23 DIAGNOSIS — C9141 Hairy cell leukemia, in remission: Secondary | ICD-10-CM

## 2022-04-23 DIAGNOSIS — I129 Hypertensive chronic kidney disease with stage 1 through stage 4 chronic kidney disease, or unspecified chronic kidney disease: Secondary | ICD-10-CM | POA: Insufficient documentation

## 2022-04-23 LAB — CBC WITH DIFFERENTIAL/PLATELET
Abs Immature Granulocytes: 0.01 10*3/uL (ref 0.00–0.07)
Basophils Absolute: 0 10*3/uL (ref 0.0–0.1)
Basophils Relative: 0 %
Eosinophils Absolute: 0.3 10*3/uL (ref 0.0–0.5)
Eosinophils Relative: 10 %
HCT: 29.6 % — ABNORMAL LOW (ref 39.0–52.0)
Hemoglobin: 9.7 g/dL — ABNORMAL LOW (ref 13.0–17.0)
Immature Granulocytes: 0 %
Lymphocytes Relative: 16 %
Lymphs Abs: 0.5 10*3/uL — ABNORMAL LOW (ref 0.7–4.0)
MCH: 33.2 pg (ref 26.0–34.0)
MCHC: 32.8 g/dL (ref 30.0–36.0)
MCV: 101.4 fL — ABNORMAL HIGH (ref 80.0–100.0)
Monocytes Absolute: 0.2 10*3/uL (ref 0.1–1.0)
Monocytes Relative: 7 %
Neutro Abs: 1.9 10*3/uL (ref 1.7–7.7)
Neutrophils Relative %: 67 %
Platelets: 119 10*3/uL — ABNORMAL LOW (ref 150–400)
RBC: 2.92 MIL/uL — ABNORMAL LOW (ref 4.22–5.81)
RDW: 14.7 % (ref 11.5–15.5)
WBC: 2.8 10*3/uL — ABNORMAL LOW (ref 4.0–10.5)
nRBC: 0 % (ref 0.0–0.2)

## 2022-04-23 LAB — BASIC METABOLIC PANEL
Anion gap: 4 — ABNORMAL LOW (ref 5–15)
BUN: 18 mg/dL (ref 8–23)
CO2: 26 mmol/L (ref 22–32)
Calcium: 8.8 mg/dL — ABNORMAL LOW (ref 8.9–10.3)
Chloride: 110 mmol/L (ref 98–111)
Creatinine, Ser: 1.44 mg/dL — ABNORMAL HIGH (ref 0.61–1.24)
GFR, Estimated: 47 mL/min — ABNORMAL LOW (ref >60)
Glucose, Bld: 94 mg/dL (ref 70–99)
Potassium: 3.8 mmol/L (ref 3.5–5.1)
Sodium: 140 mmol/L (ref 135–145)

## 2022-04-23 NOTE — Progress Notes (Signed)
[place] at [facility] [phone] [phone]) [phone]   Name: Frank Mcdaniel Date: 04/23/2022 MRN: [id number]  DOB: 09/18/1932  Patient Care Team: [name], MD as PCP - General (Family Medicine) Rockey Situ Kathlene November, MD as PCP - Cardiology (Cardiology) [name], MD as Consulting Physician (Cardiology) [name], MD as Consulting Physician (Oncology) [name] as Physician Assistant (Urology)    REASON FOR CONSULTATION: Frank Mcdaniel is a 88 y.o. male with multiple medical problems including dementia, dysphagia, hairy cell leukemia, CKD stage IIIa. Patient has recurrent hospitalizations with sepsis from aspiration pneumonia.  Patient initially declined palliative care consultation in the hospital.  Family was not interested in feeding tubes.  Palliative care is now consulted to address goals  SOCIAL HISTORY:     reports that he has never smoked. He has never used smokeless tobacco. He reports that he does not drink alcohol and does not use drugs.  Patient is a widower.  He lives at home with his son.  He is followed by hospice  ADVANCE DIRECTIVES:  On file  CODE STATUS: DNR  PAST MEDICAL HISTORY: Past Medical History:  Diagnosis Date   Acute metabolic encephalopathy 08/08/9700   Anemia 09/01/2015   Anxiety 09/11/2015   Arthritis    Asthma    as a teenager   Asymptomatic Sinus Bradycardia    Atypical chest pain    a. 08/2014   BPH (benign prostatic hyperplasia)    CKD (chronic kidney disease), stage III (HCC)    Closed fracture of tuft of distal phalanx of finger 08/31/2018   Community acquired pneumonia 08/10/2020   Coronary artery disease    a. 2000 s/p CABG;  b. 10/2008 Neg MV, EF 64%; c. cath 05/2015: LM 85% sev cal, pLAD-1 lesion 80%, pLAD-2 lesion 100% chronic, ostLCx 70% sev cal, OM4 90%, ostRCA 70%, mid RCA 90%, LIMA-LAD patent, VG-OM2 patent, VG-OM3 patent, VG-RPDA  patent. No AS   Dementia without behavioral disturbance ([place]) 05/28/2019   GERD (gastroesophageal reflux disease)    H/O echocardiogram    a. 10/2005 Echo: nl EF.   Hairy cell leukemia (HCC)    HOH (hard of hearing)    Bilateral hearing aids   HTN (hypertension)    Hyperlipidemia    Hypertension    Leukemia ([place])    Neutropenic fever ([place]) 11/08/2019   Status post inguinal hernia repair 09/13/2016   Stricture and stenosis of esophagus     PAST SURGICAL HISTORY:  Past Surgical History:  Procedure Laterality Date   CARDIAC CATHETERIZATION N/A 05/26/2015   Procedure: Left Heart Cath;  Surgeon: [name], MD;  Location: [place] [facility];  Service: Cardiovascular;  Laterality: N/A;   CARDIAC CATHETERIZATION N/A 05/26/2015   Procedure: Coronary/Graft Angiography;  Surgeon: [name], MD;  Location: [place] [facility];  Service: Cardiovascular;  Laterality: N/A;   CATARACT EXTRACTION     CORONARY ARTERY BYPASS GRAFT  2000   ESOPHAGOGASTRODUODENOSCOPY N/A 02/18/2017   Procedure: ESOPHAGOGASTRODUODENOSCOPY (EGD) with removal of food bolus;  Surgeon: [name], MD;  Location: [facility] ENDOSCOPY;  Service: Endoscopy;  Laterality: N/A;   ESOPHAGOGASTRODUODENOSCOPY (EGD) WITH PROPOFOL N/A 03/11/2017   Procedure: ESOPHAGOGASTRODUODENOSCOPY (EGD) WITH PROPOFOL;  Surgeon: [name], MD;  Location: [facility] ENDOSCOPY;  Service: Endoscopy;  Laterality: N/A;   ESOPHAGOGASTRODUODENOSCOPY (EGD) WITH PROPOFOL N/A 04/15/2017   Procedure: ESOPHAGOGASTRODUODENOSCOPY (EGD) WITH PROPOFOL;  Surgeon: [name], MD;  Location: [facility] ENDOSCOPY;  Service: Endoscopy;  Laterality: N/A;   ESOPHAGOGASTRODUODENOSCOPY (EGD) WITH PROPOFOL N/A 09/12/2020   Procedure: ESOPHAGOGASTRODUODENOSCOPY (EGD) WITH PROPOFOL;  Surgeon: Lin Landsman, MD;  Location: [facility]OSCOPY;  Service: Gastroenterology;  Laterality: N/A;   EYE SURGERY Left    Cataract Extraction with IOL   INGUINAL HERNIA REPAIR Left 06/24/2016    Procedure: HERNIA REPAIR INGUINAL ADULT;  Surgeon: [name], MD;  Location: [facility] ORS;  Service: General;  Laterality: Left;   TONSILLECTOMY  1942    UPPER GASTROINTESTINAL ENDOSCOPY  3 years ago    with Dilation.     HEMATOLOGY/ONCOLOGY HISTORY:  Oncology History  Hairy cell leukemia ([place])  01/28/2015 Initial Diagnosis   Hairy cell leukemia ([place])   11/08/2019 - 07/07/2020 Chemotherapy   Patient is on Treatment Plan : LEUKEMIA HAIRY CELL Cladribine q7d     11/08/2019 - 09/05/2020 Chemotherapy   Patient is on Treatment Plan : HAIRY CELL LEUKEMIA  Ruxience q7d       ALLERGIES:  has No Known Allergies.  MEDICATIONS:  Current Outpatient Medications  Medication Sig Dispense Refill   aspirin EC 81 MG tablet Take 1 tablet (81 mg total) by mouth daily. Swallow whole. 90 tablet 3   atorvastatin (LIPITOR) 40 MG tablet TAKE 1/2 TABLETS (20 MG TOTAL) BY MOUTH DAILY. 45 tablet 3   famotidine (PEPCID) 20 MG tablet TAKE 1 TABLET BY MOUTH EVERY DAY 90 tablet 3   finasteride (PROSCAR) 5 MG tablet TAKE 1 TABLET BY MOUTH EVERY DAY 90 tablet 3   isosorbide mononitrate (IMDUR) 30 MG 24 hr tablet Take 1 tablet (30 mg total) by mouth daily. 90 tablet 3   memantine (NAMENDA) 5 MG tablet TAKE 1 TABLET BY MOUTH TWICE A DAY 180 tablet 1   Multiple Vitamin (MULTIVITAMIN) tablet Take 1 tablet by mouth daily.     pantoprazole (PROTONIX) 40 MG tablet TAKE 1 TABLET BY MOUTH EVERY DAY (Patient taking differently: Take 40 mg by mouth every evening.) 90 tablet 1   sertraline (ZOLOFT) 100 MG tablet TAKE 1 TABLET BY MOUTH EVERY DAY 90 tablet 1   tamsulosin (FLOMAX) 0.4 MG CAPS capsule Take 0.8 mg by mouth every evening.     calcium carbonate (TUMS EX) 750 MG chewable tablet Chew 1 tablet by mouth as needed (heartburn). (Patient not taking: Reported on 04/23/2022)     hydrOXYzine (ATARAX) 10 MG tablet Take 10 mg by mouth 3 (three) times daily as needed for anxiety. (Patient not taking: Reported on 04/23/2022)      No current facility-administered medications for this visit.    VITAL SIGNS: BP (!) 140/60   Pulse (!) 54   Temp 97.6 F (36.4 C) (Oral)   Resp 18   SpO2 97%  There were no vitals filed for this visit.  Estimated body mass index is 24.96 kg/m as calculated from the following:   Height as of 03/18/22: 5' 9"  (1.753 m).   Weight as of 03/18/22: 169 lb (76.7 kg).  LABS: CBC:    Component Value Date/Time   WBC 2.8 (L) 04/23/2022 1009   HGB 9.7 (L) 04/23/2022 1009   HGB 9.4 (L) 05/11/2015 1505   HCT 29.6 (L) 04/23/2022 1009   HCT 29.3 (L) 05/11/2015 1505   PLT 119 (L) 04/23/2022 1009   PLT 172 05/11/2015 1505   MCV 101.4 (H) 04/23/2022 1009   MCV 109 (H) 05/11/2015 1505   MCV 112 (H) 12/01/2014 1421   NEUTROABS 1.9 04/23/2022 1009   NEUTROABS 1.7 12/01/2014  1421   LYMPHSABS 0.5 (L) 04/23/2022 1009   LYMPHSABS 0.7 (L) 12/01/2014 1421   MONOABS 0.2 04/23/2022 1009   MONOABS 0.1 (L) 12/01/2014 1421   EOSABS 0.3 04/23/2022 1009   EOSABS 0.1 12/01/2014 1421   BASOSABS 0.0 04/23/2022 1009   BASOSABS 0.0 12/01/2014 1421   Comprehensive Metabolic Panel:    Component Value Date/Time   NA 140 04/23/2022 1009   NA 143 04/18/2016 1624   NA 141 09/01/2014 1124   K 3.8 04/23/2022 1009   K 4.0 09/01/2014 1124   CL 110 04/23/2022 1009   CL 110 (H) 09/01/2014 1124   CO2 26 04/23/2022 1009   CO2 26 09/01/2014 1124   BUN 18 04/23/2022 1009   BUN 16 04/18/2016 1624   BUN 18 09/01/2014 1124   CREATININE 1.44 (H) 04/23/2022 1009   CREATININE 1.23 11/24/2014 1415   GLUCOSE 94 04/23/2022 1009   GLUCOSE 90 09/01/2014 1124   CALCIUM 8.8 (L) 04/23/2022 1009   CALCIUM 8.8 09/01/2014 1124   AST 21 03/09/2022 0935   AST 33 09/01/2014 1124   ALT 8 03/09/2022 0935   ALT 15 09/01/2014 1124   ALKPHOS 59 03/09/2022 0935   ALKPHOS 62 09/01/2014 1124   BILITOT 0.7 03/09/2022 0935   BILITOT 0.9 09/01/2014 1124   PROT 6.7 03/09/2022 0935   PROT 7.0 09/01/2014 1124   ALBUMIN 3.8  03/09/2022 0935   ALBUMIN 3.9 09/01/2014 1124    RADIOGRAPHIC STUDIES: No results found.  PERFORMANCE STATUS (ECOG) : 1 - Symptomatic but completely ambulatory  Review of Systems Unless otherwise noted, a complete review of systems is negative.  Physical Exam General: NAD Cardiovascular: regular rate and rhythm Pulmonary: clear ant fields Abdomen: soft, nontender, + bowel sounds GU: no suprapubic tenderness Extremities: no edema, no joint deformities Skin: no rashes Neurological: Weakness but otherwise nonfocal  IMPRESSION: I met with patient and son and then spoke with son privately.  Son is decided to pursue hospice and focus on comfort and quality of life.  We discussed the expected progression with likelihood of recurrent infection and the possible future need to decide whether or not to pursue antibiotic treatment when that occurs.  Son says that every hospitalization is associated with further decline.  However, at this point patient still has decent performance status and quality of life.  We reviewed labs today, which show cytopenias, likely secondary to recurrent hairy cell leukemia.  Son is not interested in further work-up or treatment of cancer.  He is not interested in further follow-up at the cancer center and just wants to focus on quality of life and hospice care.  All questions answered.  PLAN: -Continue hospice at home -Follow-up as needed   Time Total: 25 minutes  Visit consisted of counseling and education dealing with the complex and emotionally intense issues of symptom management and palliative care in the setting of serious and potentially life-threatening illness.Greater than 50%  of this time was spent counseling and coordinating care related to the above assessment and plan.  Signed by: Altha Harm, PhD, NP-C

## 2022-04-29 ENCOUNTER — Encounter: Payer: Self-pay | Admitting: Family Medicine

## 2022-04-29 ENCOUNTER — Ambulatory Visit (INDEPENDENT_AMBULATORY_CARE_PROVIDER_SITE_OTHER): Admitting: Family Medicine

## 2022-04-29 VITALS — BP 120/60 | HR 64 | Temp 99.1°F | Ht 69.0 in | Wt 168.4 lb

## 2022-04-29 DIAGNOSIS — R1314 Dysphagia, pharyngoesophageal phase: Secondary | ICD-10-CM | POA: Diagnosis not present

## 2022-04-29 DIAGNOSIS — Z23 Encounter for immunization: Secondary | ICD-10-CM

## 2022-04-29 DIAGNOSIS — H6120 Impacted cerumen, unspecified ear: Secondary | ICD-10-CM | POA: Insufficient documentation

## 2022-04-29 DIAGNOSIS — J690 Pneumonitis due to inhalation of food and vomit: Secondary | ICD-10-CM | POA: Diagnosis not present

## 2022-04-29 DIAGNOSIS — H6121 Impacted cerumen, right ear: Secondary | ICD-10-CM

## 2022-04-29 NOTE — Assessment & Plan Note (Signed)
Patient will continue with hospice.  Discussed that in all likelihood he will have an aspiration event that leads to pneumonia and subsequently death while on comfort measures.

## 2022-04-29 NOTE — Assessment & Plan Note (Signed)
No recurrence of symptoms.  I had a discussion with the patient's son regarding follow-up chest x-ray and we opted not to proceed with this given that the patient has gone on to hospice and would not pursue any treatment for any residual findings on the chest x-ray.

## 2022-04-29 NOTE — Assessment & Plan Note (Signed)
Noted on right side.  This was irrigated by CMA and tolerated well by patient.  Normal TM revealed.

## 2022-04-29 NOTE — Progress Notes (Signed)
Frank Rumps, MD Phone: [phone]  [name] is a 88 y.o. male who presents today for follow-up.  Aspiration pneumonia: Patient seemed to recover well from his prior pneumonia.  He notes no cough, shortness of breath, or fevers.  His son notes he is acting at his baseline.  He has recently gone on hospice related to his chronic aspiration issues as well as hairy cell leukemia and dementia.  The patient has dementia and the son has decided not to pursue any further treatment for his hairy cell leukemia or aspiration. Son reports he has HCPOA and legal POA.   Cerumen impaction: The son wants the patient's ears checked for possible cerumen impaction.  He has a history of this in the past.  Social History   Tobacco Use  Smoking Status Never  Smokeless Tobacco Never    Current Outpatient Medications on File Prior to Visit  Medication Sig Dispense Refill   aspirin EC 81 MG tablet Take 1 tablet (81 mg total) by mouth daily. Swallow whole. 90 tablet 3   atorvastatin (LIPITOR) 40 MG tablet TAKE 1/2 TABLETS (20 MG TOTAL) BY MOUTH DAILY. 45 tablet 3   calcium carbonate (TUMS EX) 750 MG chewable tablet Chew 1 tablet by mouth as needed (heartburn).     famotidine (PEPCID) 20 MG tablet TAKE 1 TABLET BY MOUTH EVERY DAY 90 tablet 3   finasteride (PROSCAR) 5 MG tablet TAKE 1 TABLET BY MOUTH EVERY DAY 90 tablet 3   hydrOXYzine (ATARAX) 10 MG tablet Take 10 mg by mouth 3 (three) times daily as needed for anxiety.     isosorbide mononitrate (IMDUR) 30 MG 24 hr tablet Take 1 tablet (30 mg total) by mouth daily. 90 tablet 3   memantine (NAMENDA) 5 MG tablet TAKE 1 TABLET BY MOUTH TWICE A DAY 180 tablet 1   Multiple Vitamin (MULTIVITAMIN) tablet Take 1 tablet by mouth daily.     pantoprazole (PROTONIX) 40 MG tablet TAKE 1 TABLET BY MOUTH EVERY DAY (Patient taking differently: Take 40 mg by mouth every evening.) 90 tablet 1   sertraline (ZOLOFT) 100 MG tablet TAKE 1 TABLET BY MOUTH EVERY DAY 90  tablet 1   tamsulosin (FLOMAX) 0.4 MG CAPS capsule Take 0.8 mg by mouth every evening.     No current facility-administered medications on file prior to visit.     ROS see history of present illness  Objective  Physical Exam Vitals:   04/29/22 1349  BP: 120/60  Pulse: 64  Temp: 99.1 F (37.3 C)  SpO2: 97%    BP Readings from Last 3 Encounters:  04/29/22 120/60  04/23/22 (!) 140/60  03/18/22 118/70   Wt Readings from Last 3 Encounters:  04/29/22 168 lb 6.4 oz (76.4 kg)  03/18/22 169 lb (76.7 kg)  03/09/22 182 lb 8.7 oz (82.8 kg)    Physical Exam Constitutional:      General: He is not in acute distress.    Appearance: He is not diaphoretic.  HENT:     Left Ear: Tympanic membrane normal.     Ears:     Comments: Right TM obscured by cerumen Cardiovascular:     Rate and Rhythm: Normal rate and regular rhythm.     Heart sounds: Normal heart sounds.  Pulmonary:     Effort: Pulmonary effort is normal.     Breath sounds: Normal breath sounds.  Skin:    General: Skin is warm and dry.  Neurological:     Mental Status: He  is alert.      Assessment/Plan: Please see individual problem list.  Problem List Items Addressed This Visit     Pharyngoesophageal phase dysphagia (Chronic)    Patient will continue with hospice.  Discussed that in all likelihood he will have an aspiration event that leads to pneumonia and subsequently death while on comfort measures.      Aspiration pneumonia ([place])    No recurrence of symptoms.  I had a discussion with the patient's son regarding follow-up chest x-ray and we opted not to proceed with this given that the patient has gone on to hospice and would not pursue any treatment for any residual findings on the chest x-ray.      Cerumen impaction    Noted on right side.  This was irrigated by CMA and tolerated well by patient.  Normal TM revealed.      Other Visit Diagnoses     Need for immunization against influenza    -   Primary   Relevant Orders   Flu Vaccine QUAD High Dose(Fluad) (Completed)       Return in about 6 months (around 10/28/2022).   Frank Rumps, MD [place]

## 2022-05-16 ENCOUNTER — Emergency Department

## 2022-05-16 ENCOUNTER — Inpatient Hospital Stay
Admission: EM | Admit: 2022-05-16 | Discharge: 2022-05-18 | DRG: 178 | Disposition: A | Discharge status: Hospice | Attending: Internal Medicine | Admitting: Internal Medicine

## 2022-05-16 ENCOUNTER — Other Ambulatory Visit: Payer: Self-pay

## 2022-05-16 ENCOUNTER — Encounter: Payer: Self-pay | Admitting: Emergency Medicine

## 2022-05-16 DIAGNOSIS — R531 Weakness: Secondary | ICD-10-CM | POA: Diagnosis not present

## 2022-05-16 DIAGNOSIS — Z515 Encounter for palliative care: Secondary | ICD-10-CM

## 2022-05-16 DIAGNOSIS — Z951 Presence of aortocoronary bypass graft: Secondary | ICD-10-CM

## 2022-05-16 DIAGNOSIS — E785 Hyperlipidemia, unspecified: Secondary | ICD-10-CM | POA: Diagnosis not present

## 2022-05-16 DIAGNOSIS — R509 Fever, unspecified: Secondary | ICD-10-CM | POA: Diagnosis not present

## 2022-05-16 DIAGNOSIS — Z79899 Other long term (current) drug therapy: Secondary | ICD-10-CM | POA: Diagnosis not present

## 2022-05-16 DIAGNOSIS — N400 Enlarged prostate without lower urinary tract symptoms: Secondary | ICD-10-CM | POA: Diagnosis present

## 2022-05-16 DIAGNOSIS — I2510 Atherosclerotic heart disease of native coronary artery without angina pectoris: Secondary | ICD-10-CM | POA: Diagnosis not present

## 2022-05-16 DIAGNOSIS — J690 Pneumonitis due to inhalation of food and vomit: Secondary | ICD-10-CM | POA: Diagnosis not present

## 2022-05-16 DIAGNOSIS — J189 Pneumonia, unspecified organism: Principal | ICD-10-CM

## 2022-05-16 DIAGNOSIS — C9140 Hairy cell leukemia not having achieved remission: Secondary | ICD-10-CM | POA: Diagnosis not present

## 2022-05-16 DIAGNOSIS — Z8261 Family history of arthritis: Secondary | ICD-10-CM

## 2022-05-16 DIAGNOSIS — Z66 Do not resuscitate: Secondary | ICD-10-CM | POA: Diagnosis present

## 2022-05-16 DIAGNOSIS — E876 Hypokalemia: Secondary | ICD-10-CM | POA: Diagnosis present

## 2022-05-16 DIAGNOSIS — G459 Transient cerebral ischemic attack, unspecified: Secondary | ICD-10-CM | POA: Diagnosis not present

## 2022-05-16 DIAGNOSIS — D696 Thrombocytopenia, unspecified: Secondary | ICD-10-CM | POA: Diagnosis not present

## 2022-05-16 DIAGNOSIS — I959 Hypotension, unspecified: Secondary | ICD-10-CM | POA: Diagnosis not present

## 2022-05-16 DIAGNOSIS — N1831 Chronic kidney disease, stage 3a: Secondary | ICD-10-CM | POA: Diagnosis not present

## 2022-05-16 DIAGNOSIS — R1310 Dysphagia, unspecified: Secondary | ICD-10-CM | POA: Diagnosis present

## 2022-05-16 DIAGNOSIS — I10 Essential (primary) hypertension: Secondary | ICD-10-CM | POA: Diagnosis not present

## 2022-05-16 DIAGNOSIS — J168 Pneumonia due to other specified infectious organisms: Secondary | ICD-10-CM | POA: Diagnosis not present

## 2022-05-16 DIAGNOSIS — F0390 Unspecified dementia without behavioral disturbance: Secondary | ICD-10-CM | POA: Diagnosis present

## 2022-05-16 DIAGNOSIS — K219 Gastro-esophageal reflux disease without esophagitis: Secondary | ICD-10-CM | POA: Diagnosis not present

## 2022-05-16 DIAGNOSIS — Z7982 Long term (current) use of aspirin: Secondary | ICD-10-CM | POA: Diagnosis not present

## 2022-05-16 DIAGNOSIS — I129 Hypertensive chronic kidney disease with stage 1 through stage 4 chronic kidney disease, or unspecified chronic kidney disease: Secondary | ICD-10-CM | POA: Diagnosis present

## 2022-05-16 DIAGNOSIS — Z20822 Contact with and (suspected) exposure to covid-19: Secondary | ICD-10-CM | POA: Diagnosis present

## 2022-05-16 DIAGNOSIS — I4510 Unspecified right bundle-branch block: Secondary | ICD-10-CM | POA: Diagnosis not present

## 2022-05-16 DIAGNOSIS — G319 Degenerative disease of nervous system, unspecified: Secondary | ICD-10-CM | POA: Diagnosis not present

## 2022-05-16 DIAGNOSIS — Z8249 Family history of ischemic heart disease and other diseases of the circulatory system: Secondary | ICD-10-CM | POA: Diagnosis not present

## 2022-05-16 DIAGNOSIS — D649 Anemia, unspecified: Secondary | ICD-10-CM | POA: Diagnosis present

## 2022-05-16 DIAGNOSIS — I6782 Cerebral ischemia: Secondary | ICD-10-CM | POA: Diagnosis not present

## 2022-05-16 DIAGNOSIS — N183 Chronic kidney disease, stage 3 unspecified: Secondary | ICD-10-CM | POA: Diagnosis present

## 2022-05-16 LAB — COMPREHENSIVE METABOLIC PANEL
ALT: 10 U/L (ref 0–44)
AST: 23 U/L (ref 15–41)
Albumin: 4.2 g/dL (ref 3.5–5.0)
Alkaline Phosphatase: 62 U/L (ref 38–126)
Anion gap: 7 (ref 5–15)
BUN: 25 mg/dL — ABNORMAL HIGH (ref 8–23)
CO2: 21 mmol/L — ABNORMAL LOW (ref 22–32)
Calcium: 9 mg/dL (ref 8.9–10.3)
Chloride: 110 mmol/L (ref 98–111)
Creatinine, Ser: 1.42 mg/dL — ABNORMAL HIGH (ref 0.61–1.24)
GFR, Estimated: 48 mL/min — ABNORMAL LOW (ref >60)
Glucose, Bld: 107 mg/dL — ABNORMAL HIGH (ref 70–99)
Potassium: 3.3 mmol/L — ABNORMAL LOW (ref 3.5–5.1)
Sodium: 138 mmol/L (ref 135–145)
Total Bilirubin: 1.2 mg/dL (ref 0.3–1.2)
Total Protein: 7.1 g/dL (ref 6.5–8.1)

## 2022-05-16 LAB — URINALYSIS, COMPLETE (UACMP) WITH MICROSCOPIC
Bacteria, UA: NONE SEEN
Bilirubin Urine: NEGATIVE
Glucose, UA: NEGATIVE mg/dL
Hgb urine dipstick: NEGATIVE
Ketones, ur: NEGATIVE mg/dL
Leukocytes,Ua: NEGATIVE
Nitrite: NEGATIVE
Protein, ur: NEGATIVE mg/dL
Specific Gravity, Urine: 1.005 (ref 1.005–1.030)
Squamous Epithelial / HPF: NONE SEEN (ref 0–5)
pH: 6 (ref 5.0–8.0)

## 2022-05-16 LAB — CBC WITH DIFFERENTIAL/PLATELET
Abs Immature Granulocytes: 0.01 10*3/uL (ref 0.00–0.07)
Basophils Absolute: 0 10*3/uL (ref 0.0–0.1)
Basophils Relative: 0 %
Eosinophils Absolute: 0 10*3/uL (ref 0.0–0.5)
Eosinophils Relative: 0 %
HCT: 31.1 % — ABNORMAL LOW (ref 39.0–52.0)
Hemoglobin: 10.2 g/dL — ABNORMAL LOW (ref 13.0–17.0)
Immature Granulocytes: 0 %
Lymphocytes Relative: 4 %
Lymphs Abs: 0.3 10*3/uL — ABNORMAL LOW (ref 0.7–4.0)
MCH: 32.8 pg (ref 26.0–34.0)
MCHC: 32.8 g/dL (ref 30.0–36.0)
MCV: 100 fL (ref 80.0–100.0)
Monocytes Absolute: 0.2 10*3/uL (ref 0.1–1.0)
Monocytes Relative: 4 %
Neutro Abs: 5.8 10*3/uL (ref 1.7–7.7)
Neutrophils Relative %: 92 %
Platelets: 103 10*3/uL — ABNORMAL LOW (ref 150–400)
RBC: 3.11 MIL/uL — ABNORMAL LOW (ref 4.22–5.81)
RDW: 14.8 % (ref 11.5–15.5)
WBC: 6.3 10*3/uL (ref 4.0–10.5)
nRBC: 0 % (ref 0.0–0.2)

## 2022-05-16 LAB — RESP PANEL BY RT-PCR (FLU A&B, COVID) ARPGX2
Influenza A by PCR: NEGATIVE
Influenza B by PCR: NEGATIVE
SARS Coronavirus 2 by RT PCR: NEGATIVE

## 2022-05-16 LAB — LACTIC ACID, PLASMA: Lactic Acid, Venous: 0.9 mmol/L (ref 0.5–1.9)

## 2022-05-16 LAB — APTT: aPTT: 39 seconds — ABNORMAL HIGH (ref 24–36)

## 2022-05-16 LAB — PROTIME-INR
INR: 1.2 (ref 0.8–1.2)
Prothrombin Time: 15.2 seconds (ref 11.4–15.2)

## 2022-05-16 LAB — TROPONIN I (HIGH SENSITIVITY): Troponin I (High Sensitivity): 24 ng/L — ABNORMAL HIGH (ref <18)

## 2022-05-16 MED ORDER — PANTOPRAZOLE SODIUM 40 MG PO TBEC
40.0000 mg | DELAYED_RELEASE_TABLET | Freq: Every evening | ORAL | Status: DC
  Administered 2022-05-17: 40 mg via ORAL
  Filled 2022-05-16: qty 1

## 2022-05-16 MED ORDER — MEMANTINE HCL 5 MG PO TABS
5.0000 mg | ORAL_TABLET | Freq: Two times a day (BID) | ORAL | Status: DC
  Administered 2022-05-16 – 2022-05-18 (×4): 5 mg via ORAL
  Filled 2022-05-16 (×4): qty 1

## 2022-05-16 MED ORDER — SODIUM CHLORIDE 0.9 % IV SOLN: 500.0000 mg | INTRAVENOUS | Status: DC

## 2022-05-16 MED ORDER — LACTATED RINGERS IV BOLUS (SEPSIS): 1500.0000 mL | Freq: Once | INTRAVENOUS | Status: DC

## 2022-05-16 MED ORDER — METRONIDAZOLE 500 MG/100ML IV SOLN
500.0000 mg | Freq: Two times a day (BID) | INTRAVENOUS | Status: DC
  Administered 2022-05-16 – 2022-05-18 (×4): 500 mg via INTRAVENOUS
  Filled 2022-05-16 (×4): qty 100

## 2022-05-16 MED ORDER — HYDROXYZINE HCL 10 MG PO TABS
10.0000 mg | ORAL_TABLET | Freq: Three times a day (TID) | ORAL | Status: DC | PRN
  Administered 2022-05-17 (×3): 10 mg via ORAL
  Filled 2022-05-16 (×5): qty 1

## 2022-05-16 MED ORDER — TAMSULOSIN HCL 0.4 MG PO CAPS
0.8000 mg | ORAL_CAPSULE | Freq: Every evening | ORAL | Status: DC
  Administered 2022-05-16 – 2022-05-17 (×2): 0.8 mg via ORAL
  Filled 2022-05-16 (×2): qty 2

## 2022-05-16 MED ORDER — ACETAMINOPHEN 325 MG PO TABS: 650.0000 mg | ORAL_TABLET | Freq: Four times a day (QID) | ORAL | Status: DC | PRN

## 2022-05-16 MED ORDER — ATORVASTATIN CALCIUM 20 MG PO TABS
20.0000 mg | ORAL_TABLET | Freq: Every day | ORAL | Status: DC
  Administered 2022-05-17 – 2022-05-18 (×2): 20 mg via ORAL
  Filled 2022-05-16 (×2): qty 1

## 2022-05-16 MED ORDER — POTASSIUM CHLORIDE 10 MEQ/100ML IV SOLN
10.0000 meq | Freq: Once | INTRAVENOUS | Status: AC
  Administered 2022-05-16: 10 meq via INTRAVENOUS
  Filled 2022-05-16: qty 100

## 2022-05-16 MED ORDER — ASPIRIN 81 MG PO TBEC
81.0000 mg | DELAYED_RELEASE_TABLET | Freq: Every day | ORAL | Status: DC
  Administered 2022-05-17 – 2022-05-18 (×2): 81 mg via ORAL
  Filled 2022-05-16 (×2): qty 1

## 2022-05-16 MED ORDER — VANCOMYCIN HCL IN DEXTROSE 1-5 GM/200ML-% IV SOLN
1000.0000 mg | Freq: Once | INTRAVENOUS | Status: AC
  Administered 2022-05-16: 1000 mg via INTRAVENOUS
  Filled 2022-05-16: qty 200

## 2022-05-16 MED ORDER — POTASSIUM CHLORIDE CRYS ER 20 MEQ PO TBCR
20.0000 meq | EXTENDED_RELEASE_TABLET | Freq: Once | ORAL | Status: AC
  Administered 2022-05-16: 20 meq via ORAL
  Filled 2022-05-16: qty 1

## 2022-05-16 MED ORDER — SERTRALINE HCL 50 MG PO TABS
100.0000 mg | ORAL_TABLET | Freq: Every day | ORAL | Status: DC
  Administered 2022-05-17 – 2022-05-18 (×2): 100 mg via ORAL
  Filled 2022-05-16 (×2): qty 2

## 2022-05-16 MED ORDER — LACTATED RINGERS IV BOLUS (SEPSIS)
2000.0000 mL | Freq: Once | INTRAVENOUS | Status: AC
  Administered 2022-05-16: 2000 mL via INTRAVENOUS

## 2022-05-16 MED ORDER — FINASTERIDE 5 MG PO TABS
5.0000 mg | ORAL_TABLET | Freq: Every day | ORAL | Status: DC
  Administered 2022-05-17 – 2022-05-18 (×2): 5 mg via ORAL
  Filled 2022-05-16 (×2): qty 1

## 2022-05-16 MED ORDER — POLYETHYLENE GLYCOL 3350 17 G PO PACK
17.0000 g | PACK | Freq: Every day | ORAL | Status: DC
  Administered 2022-05-16: 17 g via ORAL
  Filled 2022-05-16 (×3): qty 1

## 2022-05-16 MED ORDER — SODIUM CHLORIDE 0.9 % IV SOLN
2.0000 g | Freq: Once | INTRAVENOUS | Status: AC
  Administered 2022-05-16: 2 g via INTRAVENOUS
  Filled 2022-05-16: qty 12.5

## 2022-05-16 MED ORDER — ISOSORBIDE MONONITRATE ER 30 MG PO TB24
30.0000 mg | ORAL_TABLET | Freq: Every day | ORAL | Status: DC
  Administered 2022-05-17: 30 mg via ORAL
  Filled 2022-05-16 (×2): qty 1

## 2022-05-16 MED ORDER — FAMOTIDINE 20 MG PO TABS
20.0000 mg | ORAL_TABLET | Freq: Every day | ORAL | Status: DC
  Administered 2022-05-17 – 2022-05-18 (×2): 20 mg via ORAL
  Filled 2022-05-16 (×2): qty 1

## 2022-05-16 MED ORDER — SODIUM CHLORIDE 0.9 % IV SOLN
2.0000 g | INTRAVENOUS | Status: DC
  Administered 2022-05-16 – 2022-05-17 (×2): 2 g via INTRAVENOUS
  Filled 2022-05-16: qty 2
  Filled 2022-05-16: qty 20

## 2022-05-16 MED ORDER — ENOXAPARIN SODIUM 40 MG/0.4ML IJ SOSY
40.0000 mg | PREFILLED_SYRINGE | INTRAMUSCULAR | Status: DC
  Administered 2022-05-16 – 2022-05-17 (×2): 40 mg via SUBCUTANEOUS
  Filled 2022-05-16 (×2): qty 0.4

## 2022-05-16 NOTE — Assessment & Plan Note (Signed)
-   Continue home aspirin 

## 2022-05-16 NOTE — ED Triage Notes (Signed)
First Nurse: Pt here via [facility] with weakness. Pt normally walks but was unable to this morning with his hospice nurse. Pt also had a fever this morning   141-cbg 98.5-temp ('1000mg'$  of tylenol given at 1024 with hospice RN)---99.8 with ems NSR w/ RBBB 129/58 82

## 2022-05-16 NOTE — Assessment & Plan Note (Signed)
Renal function at baseline.  No further monitoring indicated.

## 2022-05-16 NOTE — ED Triage Notes (Signed)
Pt to ED via [facility] from home for weakness and not acting right. Pt has hx/o dementia. Per son, pts caregiver called and said pt did not want to get out of bed and that pt had a fever. Pt was given tylenol. Pt is in NAD.

## 2022-05-16 NOTE — Assessment & Plan Note (Addendum)
Patient's son notes Mr. Farish can become anxious and agitated at nighttime.  Requests home hydroxyzine be restarted.  - Home hydroxyzine 10 mg as needed for anxiety or agitation - Restart home memantine and sertraline

## 2022-05-16 NOTE — Progress Notes (Signed)
[facility] ED [address] [facility])       This patient is a current hospice patient with [facility], admitted 9.13.23 with a terminal diagnosis of cognitive dysfunction following CVD.   [facility] will continue to follow for any discharge planning needs and to coordinate continuation of hospice care.   Please don't hesitate to call with any Hospice related questions or concerns.    Thank you for the opportunity to participate in this patient's care. Jhonnie Garner, Therapist, sports, BSN, [facility] Liaison [phone]

## 2022-05-16 NOTE — Assessment & Plan Note (Signed)
Likely in the setting of progressive dementia.  Patient is on hospice at this time and family is not interested in feeding tubes.  No further intervention at this time

## 2022-05-16 NOTE — Progress Notes (Signed)
PHARMACY -  BRIEF ANTIBIOTIC NOTE   Pharmacy has received consult(s) for vancomycin from an ED provider.  The patient's profile has been reviewed for ht/wt/allergies/indication/available labs.    One time order(s) placed for  --Vancomycin 1 g IV  Further antibiotics/pharmacy consults should be ordered by admitting physician if indicated.                       Thank you, Benita Gutter 05/16/2022  1:58 PM

## 2022-05-16 NOTE — H&P (Signed)
History and Physical    Patient: Frank Mcdaniel:[id number] DOB: [birth year] DOA: 05/16/2022 DOS: the patient was seen and examined on 05/16/2022 PCP: [name], MD  Patient coming from: Home  Chief Complaint:  Chief Complaint  Patient presents with   Weakness   HPI: Frank Mcdaniel is a 88 y.o. male with medical history significant of dementia on hospice care, recurrent hairy cell leukemia, CAD s/o CABG, HTN, CKD Stage 3b, BPH, who presents to the ED with fever and generalized weakness.  Patient is disoriented at baseline secondary to dementia, so history was obtained through chart review and from patient's son and primary caretaker, Frank Mcdaniel.  Great states that Frank Mcdaniel was in his usual state of health yesterday.  This morning, Frank Mcdaniel seemed to be feeling fine prior to Frank Mcdaniel going to work around 5 AM.  When the caretaker arrived, she had noted that he had a fever up to 102.  When his son was home, he noticed that Frank Mcdaniel had significant difficulty lifting himself off from the toilet, which is unusual for Frank Mcdaniel.  Due to this, they brought him to the ED.    Of note, Frank Mcdaniel has a history of dysphagia with aspiration with previous aspiration pneumonia approximately 2 months ago.  At bedside, Frank Mcdaniel denies any complaints other than needing to use the restroom.  ED course: On arrival to the ED, patient was afebrile at 98.3 with blood pressure of 105/49.  He was saturating at 90% on room air.  Initial work-up remarkable for potassium of 3.3, bicarb of 21, creatinine of 1.42 (patient's baseline), hemoglobin of 10.2, platelets of 103.  Chest x-ray was obtained that demonstrated right middle and upper lobe opacities consistent with pneumonia.  TRH consulted for admission.  Review of Systems: As mentioned in the history of present illness. All other systems reviewed and are negative.  Past Medical History:  Diagnosis Date   Acute metabolic encephalopathy 12/05/9765    Anemia 09/01/2015   Anxiety 09/11/2015   Arthritis    Asthma    as a teenager   Asymptomatic Sinus Bradycardia    Atypical chest pain    a. 08/2014   BPH (benign prostatic hyperplasia)    CKD (chronic kidney disease), stage III (HCC)    Closed fracture of tuft of distal phalanx of finger 08/31/2018   Community acquired pneumonia 08/10/2020   Coronary artery disease    a. 2000 s/p CABG;  b. 10/2008 Neg MV, EF 64%; c. cath 05/2015: LM 85% sev cal, pLAD-1 lesion 80%, pLAD-2 lesion 100% chronic, ostLCx 70% sev cal, OM4 90%, ostRCA 70%, mid RCA 90%, LIMA-LAD patent, VG-OM2 patent, VG-OM3 patent, VG-RPDA patent. No AS   Dementia without behavioral disturbance ([place]) 05/28/2019   GERD (gastroesophageal reflux disease)    H/O echocardiogram    a. 10/2005 Echo: nl EF.   Hairy cell leukemia (HCC)    HOH (hard of hearing)    Bilateral hearing aids   HTN (hypertension)    Hyperlipidemia    Hypertension    Leukemia ([place])    Neutropenic fever ([place]) 11/08/2019   Status post inguinal hernia repair 09/13/2016   Stricture and stenosis of esophagus    Past Surgical History:  Procedure Laterality Date   CARDIAC CATHETERIZATION N/A 05/26/2015   Procedure: Left Heart Cath;  Surgeon: [name], MD;  Location: [place] [facility];  Service: Cardiovascular;  Laterality: N/A;   CARDIAC CATHETERIZATION N/A 05/26/2015   Procedure: Coronary/Graft Angiography;  Surgeon: [name], MD;  Location: [place] [facility];  Service: Cardiovascular;  Laterality: N/A;   CATARACT EXTRACTION     CORONARY ARTERY BYPASS GRAFT  2000   ESOPHAGOGASTRODUODENOSCOPY N/A 02/18/2017   Procedure: ESOPHAGOGASTRODUODENOSCOPY (EGD) with removal of food bolus;  Surgeon: [name], MD;  Location: [facility];  Service: Endoscopy;  Laterality: N/A;   ESOPHAGOGASTRODUODENOSCOPY (EGD) WITH PROPOFOL N/A 03/11/2017   Procedure: ESOPHAGOGASTRODUODENOSCOPY (EGD) WITH PROPOFOL;  Surgeon: [name], MD;  Location: [facility];   Service: Endoscopy;  Laterality: N/A;   ESOPHAGOGASTRODUODENOSCOPY (EGD) WITH PROPOFOL N/A 04/15/2017   Procedure: ESOPHAGOGASTRODUODENOSCOPY (EGD) WITH PROPOFOL;  Surgeon: [name], MD;  Location: [facility];  Service: Endoscopy;  Laterality: N/A;   ESOPHAGOGASTRODUODENOSCOPY (EGD) WITH PROPOFOL N/A 09/12/2020   Procedure: ESOPHAGOGASTRODUODENOSCOPY (EGD) WITH PROPOFOL;  Surgeon: Lin Landsman, MD;  Location: [facility];  Service: Gastroenterology;  Laterality: N/A;   EYE SURGERY Left    Cataract Extraction with IOL   INGUINAL HERNIA REPAIR Left 06/24/2016   Procedure: HERNIA REPAIR INGUINAL ADULT;  Surgeon: [name], MD;  Location: [facility];  Service: General;  Laterality: Left;   TONSILLECTOMY  1942    UPPER GASTROINTESTINAL ENDOSCOPY  3 years ago    with Dilation.    Social History:  reports that he has never smoked. He has never used smokeless tobacco. He reports that he does not drink alcohol and does not use drugs.  No Known Allergies  Family History  Problem Relation Age of Onset   Hypertension Mother    Arthritis Father    CAD Father    Other Other        no premature CAD.   Colon cancer Neg Hx    Prostate cancer Neg Hx    Stomach cancer Neg Hx     Prior to Admission medications   Medication Sig Start Date End Date Taking? Authorizing Provider  aspirin EC 81 MG tablet Take 1 tablet (81 mg total) by mouth daily. Swallow whole. 10/13/20  Yes Gollan, Kathlene November, MD  atorvastatin (LIPITOR) 40 MG tablet TAKE 1/2 TABLETS (20 MG TOTAL) BY MOUTH DAILY. 10/22/21  Yes [name], MD  famotidine (PEPCID) 20 MG tablet TAKE 1 TABLET BY MOUTH EVERY DAY 03/29/21  Yes [name], MD  finasteride (PROSCAR) 5 MG tablet TAKE 1 TABLET BY MOUTH EVERY DAY 03/29/21  Yes [name], MD  isosorbide mononitrate (IMDUR) 30 MG 24 hr tablet Take 1 tablet (30 mg total) by mouth daily. 02/11/22  Yes [name], MD  memantine (NAMENDA) 5 MG tablet TAKE 1  TABLET BY MOUTH TWICE A DAY 12/07/21  Yes [name], MD  Multiple Vitamin (MULTIVITAMIN) tablet Take 1 tablet by mouth daily.   Yes [provider]  pantoprazole (PROTONIX) 40 MG tablet TAKE 1 TABLET BY MOUTH EVERY DAY Patient taking differently: Take 40 mg by mouth every evening. 10/22/21  Yes [name], MD  sertraline (ZOLOFT) 100 MG tablet TAKE 1 TABLET BY MOUTH EVERY DAY 04/17/22  Yes [name], MD  tamsulosin (FLOMAX) 0.4 MG CAPS capsule Take 0.8 mg by mouth every evening.   Yes [name], MD  calcium carbonate (TUMS EX) 750 MG chewable tablet Chew 1 tablet by mouth as needed (heartburn).    [provider]  hydrOXYzine (ATARAX) 10 MG tablet Take 10 mg by mouth 3 (three) times daily as needed for anxiety.    [provider]    Physical  Exam: Vitals:   05/16/22 1302 05/16/22 1310 05/16/22 1500 05/16/22 1800  BP: (!) 105/49  (!) 142/71 126/65  Pulse: 79  76 75  Resp: 16   (!) 25  Temp: 98.3 F (36.8 C)   97.7 F (36.5 C)  TempSrc: Oral   Oral  SpO2: 90%  95% 100%  Weight:  76.3 kg    Height:  '5\' 9"'$  (1.753 m)     Physical Exam Vitals and nursing note reviewed.  Constitutional:      General: He is not in acute distress.    Appearance: He is normal weight. He is not toxic-appearing.  HENT:     Head: Normocephalic and atraumatic.     Mouth/Throat:     Mouth: Mucous membranes are moist.     Pharynx: Oropharynx is clear.  Eyes:     Conjunctiva/sclera: Conjunctivae normal.     Pupils: Pupils are equal, round, and reactive to light.  Cardiovascular:     Rate and Rhythm: Normal rate and regular rhythm.     Heart sounds: Murmur (3/6 systolic murmur) heard.  Pulmonary:     Effort: Pulmonary effort is normal. No tachypnea, bradypnea, accessory muscle usage or respiratory distress.     Breath sounds: Examination of the right-middle field reveals rales. Examination of the right-lower field reveals rales. Examination of the  left-lower field reveals rales. Rales present. No decreased breath sounds, wheezing or rhonchi.  Abdominal:     General: Bowel sounds are normal. There is no distension.     Palpations: Abdomen is soft.     Tenderness: There is no abdominal tenderness. There is no guarding.  Musculoskeletal:     Right lower leg: No edema.     Left lower leg: No edema.  Skin:    General: Skin is warm and dry.  Neurological:     General: No focal deficit present.     Mental Status: He is alert. Mental status is at baseline.     Comments: Patient oriented only to person; he is not oriented to place, time or situation.  Moving all extremities equally.  Psychiatric:        Mood and Affect: Mood is anxious.        Behavior: Behavior normal.    Data Reviewed: CBC remarkable for hemoglobin of 10.2, patient's baseline and platelets of 103.  CMP remarkable for potassium of 3.3, bicarb of 21, glucose of 107, BUN of 25, creatinine of 1.42.  Lactic acid within normal limits.  Troponin elevated at 24.  PT/INR within normal limits.  PTT elevated at 39.  Urinalysis demonstrates no abnormalities.  Chest x-ray personally reviewed.  Opacity noted in lateral middle lung lobe.  No pleural effusions.  EKG personally reviewed.  Sinus rhythm with rate of 68.  Right bundle branch block.  No acute ST or T wave changes.  CT head with no acute intracranial abnormalities.  There are no new results to review at this time.  Assessment and Plan: * Aspiration pneumonia [facility]) Patient presenting with 1 day history of fever up to 102 and increased weakness at home with chest x-ray consistent with aspiration pneumonia.  This is in the setting of chronic dysphagia with modified barium swallow in 2022 demonstrating silent aspiration.  I suspect this is progression of underlying dementia.  Patient's son has declined any further SLP evaluation at patient's last admission for this.  Patient is relatively asymptomatic with no evidence of  shortness of breath, cough or hypoxia.  Will monitor  overnight and could likely discharge home tomorrow.  - Continuous pulse oximetry - Start ceftriaxone - Start metronidazole - Consider transition to Augmentin on discharge - Tylenol as needed for fever  Dysphagia Likely in the setting of progressive dementia.  Patient is on hospice at this time and family is not interested in feeding tubes.  No further intervention at this time  Dementia without behavioral disturbance [facility]) Patient's son notes Frank Mcdaniel can become anxious and agitated at nighttime.  Requests home hydroxyzine be restarted.  - Home hydroxyzine 10 mg as needed for anxiety or agitation - Restart home memantine and sertraline  HTN (hypertension) - Continue home Imdur  CKD (chronic kidney disease), stage IIIa Renal function at baseline.  No further monitoring indicated.  Coronary artery disease - Continue home aspirin  Hypokalemia - K-Dur 20 mEq ordered   Advance Care Planning:   Code Status: DNR.  Confirmed with patient's son Frank Mcdaniel.  Consults: None  Family Communication: Updated patient's son at great via telephone  Severity of Illness: The appropriate patient status for this patient is OBSERVATION. Observation status is judged to be reasonable and necessary in order to provide the required intensity of service to ensure the patient's safety. The patient's presenting symptoms, physical exam findings, and initial radiographic and laboratory data in the context of their medical condition is felt to place them at decreased risk for further clinical deterioration. Furthermore, it is anticipated that the patient will be medically stable for discharge from the hospital within 2 midnights of admission.   Author: Jose Persia, MD 05/16/2022 7:23 PM  For on call review [email or web address].

## 2022-05-16 NOTE — Assessment & Plan Note (Signed)
-   K-Dur 20 mEq ordered

## 2022-05-16 NOTE — Assessment & Plan Note (Signed)
-   Continue home Imdur

## 2022-05-16 NOTE — ED Provider Notes (Signed)
[facility] Provider Note    Event Date/Time   First MD Initiated Contact with Patient 05/16/22 1313     (approximate)   History   Weakness   HPI  Frank Mcdaniel is a 88 y.o. male   Past medical history of dementia, CKD, dysphagia, GERD, hyperlipidemia and recent admission in August 2023 for acute metabolic encephalopathy and aspiration pneumonia who presents to the emergency department today with generalized weakness and a fever of 102 found by caretaker at home this morning got tylenol and now afebrile in ED.   Son at bedside to help with information, patient unable to provide much and says he feels well with no acute complaints.  At baseline, patient is confused but is slightly more confused right now.  Is able to ambulate without assistance at baseline.  No report of trauma.  History was obtained via patient and son at bedside      Physical Exam   Triage Vital Signs: ED Triage Vitals  Enc Vitals Group     BP 05/16/22 1302 (!) 105/49     Pulse Rate 05/16/22 1302 79     Resp 05/16/22 1302 16     Temp 05/16/22 1302 98.3 F (36.8 C)     Temp Source 05/16/22 1302 Oral     SpO2 05/16/22 1302 90 %     Weight 05/16/22 1310 168 lb 3.4 oz (76.3 kg)     Height 05/16/22 1310 '5\' 9"'$  (1.753 m)     Head Circumference --      [place] Flow --      Pain Score 05/16/22 1302 0     Pain Loc --      Pain Edu? --      Excl. in [place]? --     Most recent vital signs: Vitals:   05/16/22 1800 05/16/22 1924  BP: 126/65 (!) 125/53  Pulse: 75 83  Resp: (!) 25 17  Temp: 97.7 F (36.5 C) 98.1 F (36.7 C)  SpO2: 100% 100%    General: Awake, no distress. Disoriented, moves all extremities no facial assymmtery no sign of head trauma.  CV:  Good peripheral perfusion.  Resp:  Normal effort. Clear lungs Abd:  No distention. Non tender  ED Results / Procedures / Treatments   Labs (all labs ordered are listed, but only abnormal results are displayed) Labs  Reviewed  COMPREHENSIVE METABOLIC PANEL - Abnormal; Notable for the following components:      Result Value   Potassium 3.3 (*)    CO2 21 (*)    Glucose, Bld 107 (*)    BUN 25 (*)    Creatinine, Ser 1.42 (*)    GFR, Estimated 48 (*)    All other components within normal limits  CBC WITH DIFFERENTIAL/PLATELET - Abnormal; Notable for the following components:   RBC 3.11 (*)    Hemoglobin 10.2 (*)    HCT 31.1 (*)    Platelets 103 (*)    Lymphs Abs 0.3 (*)    All other components within normal limits  APTT - Abnormal; Notable for the following components:   aPTT 39 (*)    All other components within normal limits  URINALYSIS, COMPLETE (UACMP) WITH MICROSCOPIC - Abnormal; Notable for the following components:   Color, Urine STRAW (*)    APPearance CLEAR (*)    All other components within normal limits  TROPONIN I (HIGH SENSITIVITY) - Abnormal; Notable for the following components:   Troponin I (High Sensitivity)  24 (*)    All other components within normal limits  RESP PANEL BY RT-PCR (FLU A&B, COVID) ARPGX2  CULTURE, BLOOD (ROUTINE X 2)  CULTURE, BLOOD (ROUTINE X 2)  URINE CULTURE  LACTIC ACID, PLASMA  PROTIME-INR  STREP PNEUMONIAE URINARY ANTIGEN  LEGIONELLA PNEUMOPHILA SEROGP 1 UR AG  CBC  BASIC METABOLIC PANEL     I reviewed labs and they are notable for potassium 3.3 and initial trop 24   EKG  ED ECG REPORT I, Lucillie Garfinkel, the attending physician, personally viewed and interpreted this ECG.   Date: 05/16/2022  EKG Time: 1355  Rate: 68  Rhythm: normal EKG, normal sinus rhythm, unchanged from previous tracings, RBBB  Intervals: RBBB TWI v1-3 old and no ischemic changes, PVCs    RADIOLOGY I independently reviewed and interpreted chest x-ray history of focal opacity in the right lung field   PROCEDURES:  Critical Care performed: Yes  .Critical Care  Performed by: Lucillie Garfinkel, MD Authorized by: Lucillie Garfinkel, MD   Critical care provider statement:     Critical care time (minutes):  30   Critical care was necessary to treat or prevent imminent or life-threatening deterioration of the following conditions:  Sepsis   Critical care was time spent personally by me on the following activities:  Development of treatment plan with patient or surrogate, ordering and review of laboratory studies, ordering and review of radiographic studies, pulse oximetry, re-evaluation of patient's condition, examination of patient, review of old charts and evaluation of patient's response to treatment    MEDICATIONS ORDERED IN ED: Medications  polyethylene glycol (MIRALAX / GLYCOLAX) packet 17 g (17 g Oral Given 05/16/22 1649)  enoxaparin (LOVENOX) injection 40 mg (has no administration in time range)  cefTRIAXone (ROCEPHIN) 2 g in sodium chloride 0.9 % 100 mL IVPB (has no administration in time range)  acetaminophen (TYLENOL) tablet 650 mg (has no administration in time range)  hydrOXYzine (ATARAX) tablet 10 mg (has no administration in time range)  aspirin EC tablet 81 mg (has no administration in time range)  atorvastatin (LIPITOR) tablet 20 mg (has no administration in time range)  famotidine (PEPCID) tablet 20 mg (has no administration in time range)  finasteride (PROSCAR) tablet 5 mg (has no administration in time range)  isosorbide mononitrate (IMDUR) 24 hr tablet 30 mg (has no administration in time range)  memantine (NAMENDA) tablet 5 mg (has no administration in time range)  pantoprazole (PROTONIX) EC tablet 40 mg (has no administration in time range)  sertraline (ZOLOFT) tablet 100 mg (has no administration in time range)  tamsulosin (FLOMAX) capsule 0.8 mg (has no administration in time range)  metroNIDAZOLE (FLAGYL) IVPB 500 mg (has no administration in time range)  potassium chloride SA (KLOR-CON M) CR tablet 20 mEq (has no administration in time range)  lactated ringers bolus 2,000 mL (0 mLs Intravenous Stopped 05/16/22 1656)  ceFEPIme (MAXIPIME) 2  g in sodium chloride 0.9 % 100 mL IVPB (0 g Intravenous Stopped 05/16/22 1553)  vancomycin (VANCOCIN) IVPB 1000 mg/200 mL premix (0 mg Intravenous Stopped 05/16/22 1813)  potassium chloride 10 mEq in 100 mL IVPB (0 mEq Intravenous Stopped 05/16/22 1926)    Consultants:  I spoke with close about admission and regarding care plan for this patient.   IMPRESSION / MDM / ASSESSMENT AND PLAN / ED COURSE  I reviewed the triage vital signs and the nursing notes.  Differential diagnosis includes, but is not limited to, sepsis in this patient with borderline low blood pressure and reported fever of 102 at home with generalized weakness and no focal infectious symptoms but at risk for aspiration pneumonia.  While in the emergency department with borderline low blood pressure, otherwise without complaints and nontoxic appearance.  Sepsis work-up fluid bolus and broad-spectrum antibiotics now.  Patient with findings consistent with pneumonia, antibiotics have been given, patient remained stable and will be admitted to the hospitalist service.   Patient's presentation is most consistent with acute presentation with potential threat to life or bodily function.       FINAL CLINICAL IMPRESSION(S) / ED DIAGNOSES   Final diagnoses:  None     Rx / DC Orders   ED Discharge Orders     None        Note:  This document was prepared using Dragon voice recognition software and may include unintentional dictation errors.    Lucillie Garfinkel, MD 05/16/22 [phone]

## 2022-05-16 NOTE — ED Provider Triage Note (Signed)
Emergency Medicine Provider Triage Evaluation Note  JAKSEN FIORELLA , a 88 y.o. male  was evaluated in triage.  Presents family member.  Family member states hospice nurse was concerned about a TIA.  Patient had a fever this morning.  No chest pain or shortness of breath.  No new cough..  Review of Systems  Positive:  Negative:   Physical Exam  BP (!) 105/49 (BP Location: Left Arm)   Pulse 79   Temp 98.3 F (36.8 C) (Oral)   Resp 16   SpO2 90%  Gen:   Awake, no distress   Resp:  Normal effort  MSK:   Moves extremities without difficulty  Other:    Medical Decision Making  Medically screening exam initiated at 1:05 PM.  Appropriate orders placed.  Brenin PHILOPATEER STRINE was informed that the remainder of the evaluation will be completed by another provider, this initial triage assessment does not replace that evaluation, and the importance of remaining in the ED until their evaluation is complete.     Versie Starks, PA-C 05/16/22 1306

## 2022-05-16 NOTE — Assessment & Plan Note (Addendum)
Patient presenting with 1 day history of fever up to 102 and increased weakness at home with chest x-ray consistent with aspiration pneumonia.  This is in the setting of chronic dysphagia with modified barium swallow in 2022 demonstrating silent aspiration.  I suspect this is progression of underlying dementia.  Patient's son has declined any further SLP evaluation at patient's last admission for this.  Patient is relatively asymptomatic with no evidence of shortness of breath, cough or hypoxia.  Will monitor overnight and could likely discharge home tomorrow.  - Continuous pulse oximetry - Start ceftriaxone - Start metronidazole - Consider transition to Augmentin on discharge - Tylenol as needed for fever

## 2022-05-16 NOTE — ED Notes (Signed)
See triage note  Presents with family with some weakness  Family state she normally walks  but is weaker today Afebrile on arrival

## 2022-05-17 DIAGNOSIS — I129 Hypertensive chronic kidney disease with stage 1 through stage 4 chronic kidney disease, or unspecified chronic kidney disease: Secondary | ICD-10-CM | POA: Diagnosis present

## 2022-05-17 DIAGNOSIS — Z8261 Family history of arthritis: Secondary | ICD-10-CM | POA: Diagnosis not present

## 2022-05-17 DIAGNOSIS — Z7982 Long term (current) use of aspirin: Secondary | ICD-10-CM | POA: Diagnosis not present

## 2022-05-17 DIAGNOSIS — Z66 Do not resuscitate: Secondary | ICD-10-CM | POA: Diagnosis present

## 2022-05-17 DIAGNOSIS — E785 Hyperlipidemia, unspecified: Secondary | ICD-10-CM | POA: Diagnosis present

## 2022-05-17 DIAGNOSIS — J189 Pneumonia, unspecified organism: Secondary | ICD-10-CM | POA: Diagnosis present

## 2022-05-17 DIAGNOSIS — Z951 Presence of aortocoronary bypass graft: Secondary | ICD-10-CM | POA: Diagnosis not present

## 2022-05-17 DIAGNOSIS — R1310 Dysphagia, unspecified: Secondary | ICD-10-CM | POA: Diagnosis present

## 2022-05-17 DIAGNOSIS — D696 Thrombocytopenia, unspecified: Secondary | ICD-10-CM | POA: Diagnosis present

## 2022-05-17 DIAGNOSIS — Z79899 Other long term (current) drug therapy: Secondary | ICD-10-CM | POA: Diagnosis not present

## 2022-05-17 DIAGNOSIS — D649 Anemia, unspecified: Secondary | ICD-10-CM | POA: Diagnosis present

## 2022-05-17 DIAGNOSIS — J690 Pneumonitis due to inhalation of food and vomit: Secondary | ICD-10-CM | POA: Diagnosis present

## 2022-05-17 DIAGNOSIS — I2510 Atherosclerotic heart disease of native coronary artery without angina pectoris: Secondary | ICD-10-CM | POA: Diagnosis present

## 2022-05-17 DIAGNOSIS — E876 Hypokalemia: Secondary | ICD-10-CM | POA: Diagnosis present

## 2022-05-17 DIAGNOSIS — N400 Enlarged prostate without lower urinary tract symptoms: Secondary | ICD-10-CM | POA: Diagnosis present

## 2022-05-17 DIAGNOSIS — C9140 Hairy cell leukemia not having achieved remission: Secondary | ICD-10-CM | POA: Diagnosis present

## 2022-05-17 DIAGNOSIS — K219 Gastro-esophageal reflux disease without esophagitis: Secondary | ICD-10-CM | POA: Diagnosis present

## 2022-05-17 DIAGNOSIS — Z515 Encounter for palliative care: Secondary | ICD-10-CM | POA: Diagnosis not present

## 2022-05-17 DIAGNOSIS — Z20822 Contact with and (suspected) exposure to covid-19: Secondary | ICD-10-CM | POA: Diagnosis present

## 2022-05-17 DIAGNOSIS — F0390 Unspecified dementia without behavioral disturbance: Secondary | ICD-10-CM | POA: Diagnosis present

## 2022-05-17 DIAGNOSIS — Z8249 Family history of ischemic heart disease and other diseases of the circulatory system: Secondary | ICD-10-CM | POA: Diagnosis not present

## 2022-05-17 DIAGNOSIS — N1831 Chronic kidney disease, stage 3a: Secondary | ICD-10-CM | POA: Diagnosis not present

## 2022-05-17 LAB — BASIC METABOLIC PANEL
Anion gap: 6 (ref 5–15)
BUN: 19 mg/dL (ref 8–23)
CO2: 23 mmol/L (ref 22–32)
Calcium: 8.5 mg/dL — ABNORMAL LOW (ref 8.9–10.3)
Chloride: 112 mmol/L — ABNORMAL HIGH (ref 98–111)
Creatinine, Ser: 1.25 mg/dL — ABNORMAL HIGH (ref 0.61–1.24)
GFR, Estimated: 56 mL/min — ABNORMAL LOW (ref >60)
Glucose, Bld: 98 mg/dL (ref 70–99)
Potassium: 3.4 mmol/L — ABNORMAL LOW (ref 3.5–5.1)
Sodium: 141 mmol/L (ref 135–145)

## 2022-05-17 LAB — CBC
HCT: 27.1 % — ABNORMAL LOW (ref 39.0–52.0)
Hemoglobin: 9.1 g/dL — ABNORMAL LOW (ref 13.0–17.0)
MCH: 33.2 pg (ref 26.0–34.0)
MCHC: 33.6 g/dL (ref 30.0–36.0)
MCV: 98.9 fL (ref 80.0–100.0)
Platelets: 105 10*3/uL — ABNORMAL LOW (ref 150–400)
RBC: 2.74 MIL/uL — ABNORMAL LOW (ref 4.22–5.81)
RDW: 14.7 % (ref 11.5–15.5)
WBC: 4.9 10*3/uL (ref 4.0–10.5)
nRBC: 0 % (ref 0.0–0.2)

## 2022-05-17 LAB — STREP PNEUMONIAE URINARY ANTIGEN: Strep Pneumo Urinary Antigen: NEGATIVE

## 2022-05-17 MED ORDER — HYDROXYZINE HCL 10 MG PO TABS
10.0000 mg | ORAL_TABLET | Freq: Once | ORAL | Status: AC
  Administered 2022-05-17: 10 mg via ORAL
  Filled 2022-05-17: qty 1

## 2022-05-17 MED ORDER — SODIUM CHLORIDE 0.9 % IV SOLN: INTRAVENOUS | Status: DC | PRN

## 2022-05-17 NOTE — Progress Notes (Signed)
PROGRESS NOTE Frank Mcdaniel  EXB:[id number] DOB: 1933/07/30 DOA: 05/16/2022 PCP: [name], MD   Brief Narrative/Hospital Course: 88 y.o. male  w/  dementia on hospice care, recurrent hairy cell leukemia, CAD s/o CABG, HTN, CKD Stage 3b, BPH, who presents to the ED with fever and generalized weakness, dysphagia with aspiration with previous aspiration pneumonia 2 months ago who is disoriented at baseline, hence limited historian, per caregiver when he arrived home after leaving him in normal state at 5 AM 10/12-patient was febrile 102, and had difficulty lifting himself up from the toilet.  Hence he was brought to the ED- afebrile at 98.3 with blood pressure of 105/49.  He was saturating at 90% on room air.  Initial work-up remarkable for potassium of 3.3, bicarb of 21, creatinine of 1.42 (patient's baseline), hemoglobin of 10.2, platelets of 103.  Chest x-ray was obtained that demonstrated right middle and upper lobe opacities consistent with pneumonia. Patient was admitted for further management    Subjective: Seen and examined this morning patient having cough with thick sputum production.  Son is at the bedside, he is not in distress.  Feels fine today.   Assessment and Plan: Principal Problem:   Aspiration pneumonia ([place]) Active Problems:   Dysphagia   Dementia without behavioral disturbance (HCC)   HTN (hypertension)   CKD (chronic kidney disease), stage IIIa   Coronary artery disease   Hypokalemia   Aspiration pneumonia in the setting of dysphagia chronically: Had previous speech and MBS evaluation in the setting of progression of underlying dementia, continue with empiric antibiotics, Tylenol supportive care monitor respiratory status closely.  Chronic dysphagia: Declined further speech evaluation, would like to continue with hospice not interested with feeding tube.  Hypokalemia: We will replete  Elevated creatinine 1.4> 1.2: CKD stage III monitor closely  Anemia  hemoglobin 10.2> 9.1, normocytic: Monitor Thrombocytopenia platelet 105: Monitor Recent Labs  Lab 05/16/22 1329 05/17/22 0540  HGB 10.2* 9.1*  HCT 31.1* 27.1*  WBC 6.3 4.9  PLT 103* 105*   CAD continue home aspirin  Dementia without behavioral disturbance patient does get anxious agitated at nighttime continue hydroxyzine as needed cont home meds.  Goals of care currently DNR.  Plan is to return home with hospice care.  DVT prophylaxis: enoxaparin (LOVENOX) injection 40 mg Start: 05/16/22 2200 Code Status:   Code Status: DNR Family Communication: plan of care discussed with patient/son at bedside. Patient status is: Changed to inpatient for ongoing management of aspiration pneumonia Level of care: Med-Surg  Dispo: The patient is from: home with hospice            Anticipated disposition: Home with hospice tomorrow if remains stable  Objective: Vitals last 24 hrs: Vitals:   05/16/22 2100 05/16/22 2256 05/17/22 0359 05/17/22 0747  BP: (!) 134/53 (!) 129/52 (!) 137/45 127/63  Pulse: 69 63 67 67  Resp:  '17 17 18  '$ Temp:  98.3 F (36.8 C) 98.1 F (36.7 C) 98.7 F (37.1 C)  TempSrc:  Oral Oral Oral  SpO2: 95% 97% 95% 96%  Weight:      Height:       Weight change:   Physical Examination: General exam: alert awake, on room air, pleasantly confused HEENT:Oral mucosa moist, Ear/Nose WNL grossly Respiratory system: bilaterally clear-with basal crackles BS, no use of accessory muscle Cardiovascular system: S1 & S2 +, No JVD. Gastrointestinal system: Abdomen soft,NT,ND, BS+ Nervous System:Alert, awake, moving extremities. Extremities: LE edema neg,distal peripheral pulses palpable.  Skin: No  rashes,no icterus. MSK: Normal muscle bulk,tone, power  Medications reviewed:  Scheduled Meds:  aspirin EC  81 mg Oral Daily   atorvastatin  20 mg Oral Daily   enoxaparin (LOVENOX) injection  40 mg Subcutaneous Q24H   famotidine  20 mg Oral Daily   finasteride  5 mg Oral Daily    isosorbide mononitrate  30 mg Oral Daily   memantine  5 mg Oral BID   pantoprazole  40 mg Oral QPM   polyethylene glycol  17 g Oral Daily   sertraline  100 mg Oral Daily   tamsulosin  0.8 mg Oral QPM   Continuous Infusions:  sodium chloride     cefTRIAXone (ROCEPHIN)  IV Stopped (05/16/22 2114)   metronidazole 500 mg (05/17/22 1020)      Diet Order             Diet regular Room service appropriate? Yes; Fluid consistency: Thin  Diet effective now                  Intake/Output Summary (Last 24 hours) at 05/17/2022 1148 Last data filed at 05/17/2022 1029 Gross per 24 hour  Intake 2440 ml  Output 401 ml  Net 2039 ml   Net IO Since Admission: 2,039 mL [05/17/22 1148]  Wt Readings from Last 3 Encounters:  05/16/22 76.3 kg  04/29/22 76.4 kg  03/18/22 76.7 kg     Unresulted Labs (From admission, onward)     Start     Ordered   05/16/22 1800  Legionella Pneumophila Serogp 1 Ur Ag  Once,   R        05/16/22 1800   05/16/22 1315  Urine Culture  (Undifferentiated presentation (screening labs and basic nursing orders))  ONCE - URGENT,   URGENT       Question:  Indication  Answer:  Sepsis   05/16/22 1314          Data Reviewed: I have personally reviewed following labs and imaging studies CBC: Recent Labs  Lab 05/16/22 1329 05/17/22 0540  WBC 6.3 4.9  NEUTROABS 5.8  --   HGB 10.2* 9.1*  HCT 31.1* 27.1*  MCV 100.0 98.9  PLT 103* 024*   Basic Metabolic Panel: Recent Labs  Lab 05/16/22 1329 05/17/22 0540  NA 138 141  K 3.3* 3.4*  CL 110 112*  CO2 21* 23  GLUCOSE 107* 98  BUN 25* 19  CREATININE 1.42* 1.25*  CALCIUM 9.0 8.5*   GFR: Estimated Creatinine Clearance: 41.6 mL/min (A) (by C-G formula based on SCr of 1.25 mg/dL (H)). Liver Function Tests: Recent Labs  Lab 05/16/22 1329  AST 23  ALT 10  ALKPHOS 62  BILITOT 1.2  PROT 7.1  ALBUMIN 4.2   No results for input(s): "LIPASE", "AMYLASE" in the last 168 hours. No results for input(s):  "AMMONIA" in the last 168 hours. Coagulation Profile: Recent Labs  Lab 05/16/22 1329  INR 1.2   BNP (last 3 results) No results for input(s): "PROBNP" in the last 8760 hours. HbA1C: No results for input(s): "HGBA1C" in the last 72 hours. CBG: No results for input(s): "GLUCAP" in the last 168 hours. Lipid Profile: No results for input(s): "CHOL", "HDL", "LDLCALC", "TRIG", "CHOLHDL", "LDLDIRECT" in the last 72 hours. Thyroid Function Tests: No results for input(s): "TSH", "T4TOTAL", "FREET4", "T3FREE", "THYROIDAB" in the last 72 hours. Sepsis Labs: Recent Labs  Lab 05/16/22 1329  LATICACIDVEN 0.9    Recent Results (from the past 240 hour(s))  Resp Panel  by RT-PCR (Flu A&B, Covid) Anterior Nasal Swab     Status: None   Collection Time: 05/16/22  1:29 PM   Specimen: Anterior Nasal Swab  Result Value Ref Range Status   SARS Coronavirus 2 by RT PCR NEGATIVE NEGATIVE Final    Comment: (NOTE) SARS-CoV-2 target nucleic acids are NOT DETECTED.  The SARS-CoV-2 RNA is generally detectable in upper respiratory specimens during the acute phase of infection. The lowest concentration of SARS-CoV-2 viral copies this assay can detect is 138 copies/mL. A negative result does not preclude SARS-Cov-2 infection and should not be used as the sole basis for treatment or other patient management decisions. A negative result may occur with  improper specimen collection/handling, submission of specimen other than nasopharyngeal swab, presence of viral mutation(s) within the areas targeted by this assay, and inadequate number of viral copies(<138 copies/mL). A negative result must be combined with clinical observations, patient history, and epidemiological information. The expected result is Negative.  Fact Sheet for Patients:  EntrepreneurPulse.com.au  Fact Sheet for Healthcare Providers:  IncredibleEmployment.be  This test is no t yet approved or cleared  by the [place] FDA and  has been authorized for detection and/or diagnosis of SARS-CoV-2 by FDA under an Emergency Use Authorization (EUA). This EUA will remain  in effect (meaning this test can be used) for the duration of the COVID-19 declaration under Section 564(b)(1) of the Act, 21 U.S.C.section 360bbb-3(b)(1), unless the authorization is terminated  or revoked sooner.       Influenza A by PCR NEGATIVE NEGATIVE Final   Influenza B by PCR NEGATIVE NEGATIVE Final    Comment: (NOTE) The Xpert Xpress SARS-CoV-2/FLU/RSV plus assay is intended as an aid in the diagnosis of influenza from Nasopharyngeal swab specimens and should not be used as a sole basis for treatment. Nasal washings and aspirates are unacceptable for Xpert Xpress SARS-CoV-2/FLU/RSV testing.  Fact Sheet for Patients: EntrepreneurPulse.com.au  Fact Sheet for Healthcare Providers: IncredibleEmployment.be  This test is not yet approved or cleared by the [place] FDA and has been authorized for detection and/or diagnosis of SARS-CoV-2 by FDA under an Emergency Use Authorization (EUA). This EUA will remain in effect (meaning this test can be used) for the duration of the COVID-19 declaration under Section 564(b)(1) of the Act, 21 U.S.C. section 360bbb-3(b)(1), unless the authorization is terminated or revoked.  Performed at [facility], [address] [place], [place] [address]   Culture, blood (routine x 2)     Status: None (Preliminary result)   Collection Time: 05/16/22  1:29 PM   Specimen: BLOOD  Result Value Ref Range Status   Specimen Description BLOOD BLOOD LEFT FOREARM  Final   Special Requests   Final    BOTTLES DRAWN AEROBIC AND ANAEROBIC Blood Culture adequate volume   Culture   Final    NO GROWTH < 24 HOURS Performed at [facility], [address] [place], [place] [address]    Report Status PENDING  Incomplete  Culture, blood  (routine x 2)     Status: None (Preliminary result)   Collection Time: 05/16/22  1:29 PM   Specimen: BLOOD  Result Value Ref Range Status   Specimen Description BLOOD BLOOD RIGHT HAND  Final   Special Requests   Final    BOTTLES DRAWN AEROBIC AND ANAEROBIC Blood Culture adequate volume   Culture   Final    NO GROWTH < 24 HOURS Performed at [facility], [place], [place]  [address]    Report Status PENDING  Incomplete    Antimicrobials: Anti-infectives (From admission, onward)    Start     Dose/Rate Route Frequency Ordered Stop   05/16/22 2200  cefTRIAXone (ROCEPHIN) 2 g in sodium chloride 0.9 % 100 mL IVPB        2 g 200 mL/hr over 30 Minutes Intravenous Every 24 hours 05/16/22 1800 05/21/22 2159   05/16/22 1930  metroNIDAZOLE (FLAGYL) IVPB 500 mg        500 mg 100 mL/hr over 60 Minutes Intravenous Every 12 hours 05/16/22 1917     05/16/22 1900  azithromycin (ZITHROMAX) 500 mg in sodium chloride 0.9 % 250 mL IVPB  Status:  Discontinued        500 mg 250 mL/hr over 60 Minutes Intravenous Every 24 hours 05/16/22 1800 05/16/22 1917   05/16/22 1400  vancomycin (VANCOCIN) IVPB 1000 mg/200 mL premix        1,000 mg 200 mL/hr over 60 Minutes Intravenous  Once 05/16/22 1358 05/16/22 1813   05/16/22 1345  ceFEPIme (MAXIPIME) 2 g in sodium chloride 0.9 % 100 mL IVPB        2 g 200 mL/hr over 30 Minutes Intravenous  Once 05/16/22 1333 05/16/22 1553      Culture/Microbiology    Component Value Date/Time   SDES BLOOD BLOOD LEFT FOREARM 05/16/2022 1329   SDES BLOOD BLOOD RIGHT HAND 05/16/2022 1329   SPECREQUEST  05/16/2022 1329    BOTTLES DRAWN AEROBIC AND ANAEROBIC Blood Culture adequate volume   SPECREQUEST  05/16/2022 1329    BOTTLES DRAWN AEROBIC AND ANAEROBIC Blood Culture adequate volume   CULT  05/16/2022 1329    NO GROWTH < 24 HOURS Performed at [facility], [address] [place], [place] [address]    CULT  05/16/2022 1329    NO GROWTH  < 24 HOURS Performed at [facility], [address] [place], [place] [address]    REPTSTATUS PENDING 05/16/2022 1329   REPTSTATUS PENDING 05/16/2022 1329    Other culture-see note Radiology Studies: DG Chest 2 View  Result Date: 05/16/2022 CLINICAL DATA:  Fever. Weakness. Unable to walk this morning with hospice nurse. EXAM: CHEST - 2 VIEW COMPARISON:  AP chest 03/09/2022 coronal CT chest 03/09/2022 FINDINGS: Status post median sternotomy and CABG. There is new heterogeneous airspace opacification within the lateral mid to superior right lung compared to 03/09/2022 frontal radiograph. Mild bilateral interstitial thickening is similar to prior. Note is made of bilateral patchy airspace opacities on prior 03/09/2022 CT, less well seen on same day 03/09/2022 radiographs. Moderately decreased lung volumes. No focal airspace opacity to indicate pneumonia. No definite pleural effusion. No pneumothorax. Moderate multilevel degenerative disc changes of the thoracic spine. IMPRESSION: New heterogeneous airspace opacification within the lateral mid to superior right lung compared to 03/09/2022 frontal radiograph. Given the patient's fever, this is highly suggestive of pneumonia. Recommend follow-up radiographs 6 weeks after treatment to ensure resolution. Electronically Signed   By: Yvonne Kendall M.D.   On: 05/16/2022 14:57   CT HEAD WO CONTRAST (5MM)  Result Date: 05/16/2022 CLINICAL DATA:  Transient ischemic attack (TIA). EXAM: CT HEAD WITHOUT CONTRAST TECHNIQUE: Contiguous axial images were obtained from the base of the skull through the vertex without intravenous contrast. RADIATION DOSE REDUCTION: This exam was performed according to the departmental dose-optimization program which includes automated exposure control, adjustment of the mA and/or kV according to patient size and/or use of iterative reconstruction technique. COMPARISON:  Head CT 03/09/2022 and MRI 06/17/2019 FINDINGS: Brain:  There is no evidence of an acute infarct, intracranial hemorrhage, mass, midline shift, or extra-axial fluid collection. Hypodensities in the cerebral white matter bilaterally are unchanged and nonspecific but compatible with mild-to-moderate chronic small vessel ischemic disease. Moderate overall cerebral atrophy is unchanged, and prominent volume loss is again noted in the right greater than left mesial temporal lobes. Vascular: Calcified atherosclerosis at the skull base. No hyperdense vessel. Skull: No fracture or suspicious osseous lesion. Sinuses/Orbits: Paranasal sinuses and mastoid air cells are clear. Left cataract extraction. Other: None. IMPRESSION: 1. No evidence of acute intracranial abnormality. 2. Mild-to-moderate chronic small vessel ischemic disease. Electronically Signed   By: Logan Bores M.D.   On: 05/16/2022 14:20     LOS: 0 days   Antonieta Pert, MD [facility]  05/17/2022, 11:48 AM

## 2022-05-17 NOTE — Progress Notes (Signed)
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 Patient was in his normal state of health on Wednesday and then Thursday morning woke running a fever and unable to get up out of bed with weakness to one side of body. Son made decision for him to be transported to hospital. He was admitted with a diagnosis of acute on chronic respiratory failure. Per Dr. Karie Georges with [facility] this is a related hospital admission.  Visted at bedside, Frank Mcdaniel is awake and alert and reports to feeling "OK", Son Frank Mcdaniel is at bedside. He has been treated with IVAB. The plan will likely be for him to get antibiotics through today and then discharge home tomorrow.   Patient remains inpatient appropriate due to need for IV antibiotics.  Vital Signs-  98.3/ 75/ 18    119/59    97% room air Intake/Output- 775/901 Abnormal labs- K+ 3.4, Chloride 112, Creatinine 1.25, Ca+ 8.5, GFR 56, RBC 2.74, Hgb 9.1, Hct 27.1, Platelets 105 Diagnostics-   CHEST - 2 VIEW COMPARISON:  AP chest 03/09/2022 coronal CT chest 03/09/2022  FINDINGS: Status post median sternotomy and CABG.  There is new heterogeneous airspace opacification within the lateral mid to superior right lung compared to 03/09/2022 frontal radiograph. Mild bilateral interstitial thickening is similar to prior. Note is made of bilateral patchy airspace opacities on prior 03/09/2022 CT, less well seen on same day 03/09/2022 radiographs.  Moderately decreased lung volumes. No focal airspace opacity to indicate pneumonia. No definite pleural effusion. No pneumothorax. Moderate multilevel degenerative disc changes of the thoracic spine. IMPRESSION: New heterogeneous airspace opacification within the lateral mid to superior right lung compared to 03/09/2022 frontal radiograph. Given the patient's fever, this is highly  suggestive of pneumonia. Recommend follow-up radiographs 6 weeks after treatment to ensure resolution.  Electronically Signed   By: Yvonne Kendall M.D.   On: 05/16/2022 14:57  IV/PRN Meds- Zithromax '500mg'$  IV x1, Cefepime 2g IV x2, Rocephin 2g IV q24H, LR 3520m bolus IV x1, Flagyl '500mg'$  IV q12H  Problem List Aspiration pneumonia in the setting of dysphagia chronically: Had previous speech and MBS evaluation in the setting of progression of underlying dementia, continue with empiric antibiotics, Tylenol supportive care monitor respiratory status closely.   Chronic dysphagia: Declined further speech evaluation, would like to continue with hospice not interested with feeding tube.   Hypokalemia: We will replete  Discharge Planning- Discharge likely tomorrow, back to home environment Family Contact- Talked with son at bedside IDT- Updated Goals of care - DNR MJhonnie Garner RN, BSN, WSandy Pines Psychiatric HospitalLiaison [phone]

## 2022-05-17 NOTE — TOC Initial Note (Signed)
Transition of Care [facility]) - Initial/Assessment Note    Patient Details  Name: Frank Mcdaniel MRN: [id number] Date of Birth: 1933/04/29  Transition of Care [facility]) CM/SW Contact:    Magnus Ivan, LCSW Phone Number: 05/17/2022, 10:01 AM  Clinical Narrative:    Patient is from home with [facility] hospice, confirmed with [facility]. Patient lives with son and has 24 hour care at home. TOC will follow for needs.                Expected Discharge Plan: Home w Hospice Care Barriers to Discharge: Continued Medical Work up   Patient Goals and CMS Choice        Expected Discharge Plan and Services Expected Discharge Plan: [place]                                              Prior Living Arrangements/Services   Lives with:: Adult Children              Current home services: DME, Sitter, Hospice    Activities of Daily Living      Permission Sought/Granted                  Emotional Assessment              Admission diagnosis:  CAP (community acquired pneumonia) [J18.9] Pneumonia of right lung due to infectious organism, unspecified part of lung [J18.9] Patient Active Problem List   Diagnosis Date Noted   Cerumen impaction 04/29/2022   Bruise 03/18/2022   Hypokalemia 79/89/2119   Acute metabolic encephalopathy 41/74/0814   Overweight (BMI 25.0-29.9) 02/16/2022   Pharyngoesophageal phase dysphagia 04/17/2021   Aspiration pneumonia ([place]) 11/27/2020   Fall 11/13/2020   CKD (chronic kidney disease), stage IIIa 02/28/2020   IBS (irritable bowel syndrome) 08/02/2019   Dementia without behavioral disturbance ([place]) 05/28/2019   Dysphagia    Constipation 05/21/2016   Bilateral low back pain without sciatica 12/21/2015   Anxiety 09/11/2015   Anemia 09/01/2015   GERD (gastroesophageal reflux disease) 06/02/2015   Asthma 01/28/2015   BPH (benign prostatic hyperplasia) 01/28/2015   Hairy cell leukemia ([place]) 01/28/2015    Coronary artery disease    S/P CABG (coronary artery bypass graft) 11/08/2010   HTN (hypertension) 11/08/2010   Hyperlipidemia 10/27/2009   PCP:  [name], MD Pharmacy:   [place], [place] - [address] [place] [address] Phone: [phone] Fax: [phone]     Social Determinants of Health (SDOH) Interventions    Readmission Risk Interventions    11/14/2020   11:46 AM 11/14/2020   11:21 AM 11/10/2019    1:04 PM  Readmission Risk Prevention Plan  Transportation Screening Complete  Complete  PCP or Specialist Appt within 3-5 Days Complete  Complete  HRI or Home Care Consult Complete  Complete  Social Work Consult for [place] Planning/Counseling Complete    Palliative Care Screening Not Applicable Not Applicable   Medication Review Press photographer) Complete  Complete

## 2022-05-17 NOTE — Hospital Course (Addendum)
88 y.o. male  w/  dementia on hospice care, recurrent hairy cell leukemia, CAD s/o CABG, HTN, CKD Stage 3b, BPH, who presents to the ED with fever and generalized weakness, dysphagia with aspiration with previous aspiration pneumonia 2 months ago who is disoriented at baseline, hence limited historian, per caregiver when he arrived home after leaving him in normal state at 5 AM 10/12-patient was febrile 102, and had difficulty lifting himself up from the toilet.  Hence he was brought to the ED- afebrile at 98.3 with blood pressure of 105/49.  He was saturating at 90% on room air.  Initial work-up remarkable for potassium of 3.3, bicarb of 21, creatinine of 1.42 (patient's baseline), hemoglobin of 10.2, platelets of 103.  Chest x-ray was obtained that demonstrated right middle and upper lobe opacities consistent with pneumonia. Patient was admitted for further management> treated with IV antibiotics.  Further speech evaluation declined by patient patient's son and plans to return back home with hospice care and continue doing what he has been doing with ongoing risk of aspiration, on comfort feeding. We will discharge him on oral Augmentin remains hemodynamically stable afebrile

## 2022-05-18 LAB — URINE CULTURE: Culture: <10000 — AB

## 2022-05-18 MED ORDER — AMOXICILLIN-POT CLAVULANATE 875-125 MG PO TABS: 1.0000 | ORAL_TABLET | Freq: Two times a day (BID) | ORAL | 0 refills | Status: AC

## 2022-05-18 NOTE — Discharge Summary (Signed)
Physician Discharge Summary  JADD GASIOR QQP:[id number] DOB: 11-21-1932 DOA: 05/16/2022  PCP: [name], MD  Admit date: 05/16/2022 Discharge date: 05/18/2022 Recommendations for Outpatient Follow-up:  Follow up with PCP in 1 weeks-call for appointment  Discharge Dispo: home /w hospice Discharge Condition: Stable Code Status:   Code Status: DNR Diet recommendation:  Diet Order             Diet regular Room service appropriate? Yes; Fluid consistency: Thin  Diet effective now                 Brief/Interim Summary: 89 y.o. male  w/  dementia on hospice care, recurrent hairy cell leukemia, CAD s/o CABG, HTN, CKD Stage 3b, BPH, who presents to the ED with fever and generalized weakness, dysphagia with aspiration with previous aspiration pneumonia 2 months ago who is disoriented at baseline, hence limited historian, per caregiver when he arrived home after leaving him in normal state at 5 AM 10/12-patient was febrile 102, and had difficulty lifting himself up from the toilet.  Hence he was brought to the ED- afebrile at 98.3 with blood pressure of 105/49.  He was saturating at 90% on room air.  Initial work-up remarkable for potassium of 3.3, bicarb of 21, creatinine of 1.42 (patient's baseline), hemoglobin of 10.2, platelets of 103.  Chest x-ray was obtained that demonstrated right middle and upper lobe opacities consistent with pneumonia. Patient was admitted for further management> treated with IV antibiotics.  Further speech evaluation declined by patient patient's son and plans to return back home with hospice care and continue doing what he has been doing with ongoing risk of aspiration, on comfort feeding. We will discharge him on oral Augmentin remains hemodynamically stable afebrile   Discharge Diagnoses:  Principal Problem:   Aspiration pneumonia ([place]) Active Problems:   Dysphagia   Dementia without behavioral disturbance (HCC)   HTN (hypertension)   CKD (chronic  kidney disease), stage IIIa   Coronary artery disease   Hypokalemia  Aspiration pneumonia in the setting of dysphagia chronically: Had previous speech and MBS evaluation in the setting of progression of underlying dementia, continue with empiric antibiotics, Tylenol supportive care monitor respiratory status closely.   Chronic dysphagia: Declined further speech evaluation, would like to continue with hospice not interested with feeding tube.   Hypokalemia: We will replete   Elevated creatinine 1.4> 1.2: CKD stage III monitor closely   Anemia hemoglobin 10.2> 9.1, normocytic: Monitor Thrombocytopenia platelet 105: Monitor Recent Labs  Lab 05/16/22 1329 05/17/22 0540  HGB 10.2* 9.1*  HCT 31.1* 27.1*  WBC 6.3 4.9  PLT 103* 105*    CAD continue home aspirin   Dementia without behavioral disturbance patient does get anxious agitated at nighttime continue hydroxyzine as needed cont home meds.   Goals of care currently DNR.  Plan is to return home with hospice care.    Consults: None Subjective: Aao, son at bedside  Ls ready t ogo home today  Discharge Exam: Vitals:   05/18/22 0535 05/18/22 0845  BP: (!) 122/55 (!) 123/49  Pulse: 63 70  Resp: 16 18  Temp: 99.6 F (37.6 C) 97.8 F (36.6 C)  SpO2: 97% 95%   General: Pt is alert, awake, not in acute distress Cardiovascular: RRR, S1/S2 +, no rubs, no gallops Respiratory: CTA bilaterally, no wheezing, no rhonchi Abdominal: Soft, NT, ND, bowel sounds + Extremities: no edema, no cyanosis  Discharge Instructions  Discharge Instructions     Discharge instructions  Complete by: As directed    Please call call MD or return to ER for similar or worsening recurring problem that brought you to hospital or if any fever,nausea/vomiting,abdominal pain, uncontrolled pain, chest pain,  shortness of breath or any other alarming symptoms.  Please follow-up your doctor as instructed in a week time and call the office for  appointment.  Please avoid alcohol, smoking, or any other illicit substance and maintain healthy habits including taking your regular medications as prescribed.  You were cared for by a hospitalist during your hospital stay. If you have any questions about your discharge medications or the care you received while you were in the hospital after you are discharged, you can call the unit and ask to speak with the hospitalist on call if the hospitalist that took care of you is not available.  Once you are discharged, your primary care physician will handle any further medical issues. Please note that NO REFILLS for any discharge medications will be authorized once you are discharged, as it is imperative that you return to your primary care physician (or establish a relationship with a primary care physician if you do not have one) for your aftercare needs so that they can reassess your need for medications and monitor your lab values   Increase activity slowly   Complete by: As directed       Allergies as of 05/18/2022   No Known Allergies      Medication List     TAKE these medications    amoxicillin-clavulanate 875-125 MG tablet Commonly known as: AUGMENTIN Take 1 tablet by mouth 2 (two) times daily for 5 days.   aspirin EC 81 MG tablet Take 1 tablet (81 mg total) by mouth daily. Swallow whole.   atorvastatin 40 MG tablet Commonly known as: LIPITOR TAKE 1/2 TABLETS (20 MG TOTAL) BY MOUTH DAILY.   calcium carbonate 750 MG chewable tablet Commonly known as: TUMS EX Chew 1 tablet by mouth as needed (heartburn).   famotidine 20 MG tablet Commonly known as: PEPCID TAKE 1 TABLET BY MOUTH EVERY DAY   finasteride 5 MG tablet Commonly known as: PROSCAR TAKE 1 TABLET BY MOUTH EVERY DAY   hydrOXYzine 10 MG tablet Commonly known as: ATARAX Take 10 mg by mouth 3 (three) times daily as needed for anxiety.   isosorbide mononitrate 30 MG 24 hr tablet Commonly known as: IMDUR Take 1  tablet (30 mg total) by mouth daily.   memantine 5 MG tablet Commonly known as: NAMENDA TAKE 1 TABLET BY MOUTH TWICE A DAY   multivitamin tablet Take 1 tablet by mouth daily.   pantoprazole 40 MG tablet Commonly known as: PROTONIX TAKE 1 TABLET BY MOUTH EVERY DAY What changed: when to take this   sertraline 100 MG tablet Commonly known as: ZOLOFT TAKE 1 TABLET BY MOUTH EVERY DAY   tamsulosin 0.4 MG Caps capsule Commonly known as: FLOMAX Take 0.8 mg by mouth every evening.        Follow-up Information     [name], MD Follow up in 1 week(s).   Specialty: Family Medicine Contact information: [address] [place] [address] [phone]                No Known Allergies  The results of significant diagnostics from this hospitalization (including imaging, microbiology, ancillary and laboratory) are listed below for reference.    Microbiology: Recent Results (from the past 240 hour(s))  Resp Panel by RT-PCR (Flu  A&B, Covid) Anterior Nasal Swab     Status: None   Collection Time: 05/16/22  1:29 PM   Specimen: Anterior Nasal Swab  Result Value Ref Range Status   SARS Coronavirus 2 by RT PCR NEGATIVE NEGATIVE Final    Comment: (NOTE) SARS-CoV-2 target nucleic acids are NOT DETECTED.  The SARS-CoV-2 RNA is generally detectable in upper respiratory specimens during the acute phase of infection. The lowest concentration of SARS-CoV-2 viral copies this assay can detect is 138 copies/mL. A negative result does not preclude SARS-Cov-2 infection and should not be used as the sole basis for treatment or other patient management decisions. A negative result may occur with  improper specimen collection/handling, submission of specimen other than nasopharyngeal swab, presence of viral mutation(s) within the areas targeted by this assay, and inadequate number of viral copies(<138 copies/mL). A negative result must be combined with clinical  observations, patient history, and epidemiological information. The expected result is Negative.  Fact Sheet for Patients:  EntrepreneurPulse.com.au  Fact Sheet for Healthcare Providers:  IncredibleEmployment.be  This test is no t yet approved or cleared by the [place] FDA and  has been authorized for detection and/or diagnosis of SARS-CoV-2 by FDA under an Emergency Use Authorization (EUA). This EUA will remain  in effect (meaning this test can be used) for the duration of the COVID-19 declaration under Section 564(b)(1) of the Act, 21 U.S.C.section 360bbb-3(b)(1), unless the authorization is terminated  or revoked sooner.       Influenza A by PCR NEGATIVE NEGATIVE Final   Influenza B by PCR NEGATIVE NEGATIVE Final    Comment: (NOTE) The Xpert Xpress SARS-CoV-2/FLU/RSV plus assay is intended as an aid in the diagnosis of influenza from Nasopharyngeal swab specimens and should not be used as a sole basis for treatment. Nasal washings and aspirates are unacceptable for Xpert Xpress SARS-CoV-2/FLU/RSV testing.  Fact Sheet for Patients: EntrepreneurPulse.com.au  Fact Sheet for Healthcare Providers: IncredibleEmployment.be  This test is not yet approved or cleared by the [place] FDA and has been authorized for detection and/or diagnosis of SARS-CoV-2 by FDA under an Emergency Use Authorization (EUA). This EUA will remain in effect (meaning this test can be used) for the duration of the COVID-19 declaration under Section 564(b)(1) of the Act, 21 U.S.C. section 360bbb-3(b)(1), unless the authorization is terminated or revoked.  Performed at [facility], [address] [place], [place] [address]   Culture, blood (routine x 2)     Status: None (Preliminary result)   Collection Time: 05/16/22  1:29 PM   Specimen: BLOOD  Result Value Ref Range Status   Specimen Description BLOOD BLOOD  LEFT FOREARM  Final   Special Requests   Final    BOTTLES DRAWN AEROBIC AND ANAEROBIC Blood Culture adequate volume   Culture   Final    NO GROWTH 2 DAYS Performed at [facility], [address] [place], [place] [address]    Report Status PENDING  Incomplete  Culture, blood (routine x 2)     Status: None (Preliminary result)   Collection Time: 05/16/22  1:29 PM   Specimen: BLOOD  Result Value Ref Range Status   Specimen Description BLOOD BLOOD RIGHT HAND  Final   Special Requests   Final    BOTTLES DRAWN AEROBIC AND ANAEROBIC Blood Culture adequate volume   Culture   Final    NO GROWTH 2 DAYS Performed at [facility], [address], [place], [place] [address]    Report  Status PENDING  Incomplete  Urine Culture     Status: Abnormal   Collection Time: 05/16/22  4:06 PM   Specimen: Urine, Random  Result Value Ref Range Status   Specimen Description   Final    URINE, RANDOM Performed at [facility], [address], [place], [place] [address]    Special Requests   Final    NONE Performed at [facility], [address] [place], [place] [address]    Culture (A)  Final    <10,000 COLONIES/mL INSIGNIFICANT GROWTH Performed at [facility] [address] [place], [place] [address]    Report Status 05/18/2022 FINAL  Final    Procedures/Studies: DG Chest 2 View  Result Date: 05/16/2022 CLINICAL DATA:  Fever. Weakness. Unable to walk this morning with hospice nurse. EXAM: CHEST - 2 VIEW COMPARISON:  AP chest 03/09/2022 coronal CT chest 03/09/2022 FINDINGS: Status post median sternotomy and CABG. There is new heterogeneous airspace opacification within the lateral mid to superior right lung compared to 03/09/2022 frontal radiograph. Mild bilateral interstitial thickening is similar to prior. Note is made of bilateral patchy airspace opacities on prior 03/09/2022 CT, less well seen on same day 03/09/2022 radiographs. Moderately  decreased lung volumes. No focal airspace opacity to indicate pneumonia. No definite pleural effusion. No pneumothorax. Moderate multilevel degenerative disc changes of the thoracic spine. IMPRESSION: New heterogeneous airspace opacification within the lateral mid to superior right lung compared to 03/09/2022 frontal radiograph. Given the patient's fever, this is highly suggestive of pneumonia. Recommend follow-up radiographs 6 weeks after treatment to ensure resolution. Electronically Signed   By: Yvonne Kendall M.D.   On: 05/16/2022 14:57   CT HEAD WO CONTRAST (5MM)  Result Date: 05/16/2022 CLINICAL DATA:  Transient ischemic attack (TIA). EXAM: CT HEAD WITHOUT CONTRAST TECHNIQUE: Contiguous axial images were obtained from the base of the skull through the vertex without intravenous contrast. RADIATION DOSE REDUCTION: This exam was performed according to the departmental dose-optimization program which includes automated exposure control, adjustment of the mA and/or kV according to patient size and/or use of iterative reconstruction technique. COMPARISON:  Head CT 03/09/2022 and MRI 06/17/2019 FINDINGS: Brain: There is no evidence of an acute infarct, intracranial hemorrhage, mass, midline shift, or extra-axial fluid collection. Hypodensities in the cerebral white matter bilaterally are unchanged and nonspecific but compatible with mild-to-moderate chronic small vessel ischemic disease. Moderate overall cerebral atrophy is unchanged, and prominent volume loss is again noted in the right greater than left mesial temporal lobes. Vascular: Calcified atherosclerosis at the skull base. No hyperdense vessel. Skull: No fracture or suspicious osseous lesion. Sinuses/Orbits: Paranasal sinuses and mastoid air cells are clear. Left cataract extraction. Other: None. IMPRESSION: 1. No evidence of acute intracranial abnormality. 2. Mild-to-moderate chronic small vessel ischemic disease. Electronically Signed   By: Logan Bores M.D.   On: 05/16/2022 14:20    Labs: BNP (last 3 results) Recent Labs    02/16/22 1000  BNP 213.0*   Basic Metabolic Panel: Recent Labs  Lab 05/16/22 1329 05/17/22 0540  NA 138 141  K 3.3* 3.4*  CL 110 112*  CO2 21* 23  GLUCOSE 107* 98  BUN 25* 19  CREATININE 1.42* 1.25*  CALCIUM 9.0 8.5*   Liver Function Tests: Recent Labs  Lab 05/16/22 1329  AST 23  ALT 10  ALKPHOS 62  BILITOT 1.2  PROT 7.1  ALBUMIN 4.2   No results for input(s): "LIPASE", "AMYLASE" in the last 168 hours. No results  for input(s): "AMMONIA" in the last 168 hours. CBC: Recent Labs  Lab 05/16/22 1329 05/17/22 0540  WBC 6.3 4.9  NEUTROABS 5.8  --   HGB 10.2* 9.1*  HCT 31.1* 27.1*  MCV 100.0 98.9  PLT 103* 105*   Cardiac Enzymes: No results for input(s): "CKTOTAL", "CKMB", "CKMBINDEX", "TROPONINI" in the last 168 hours. BNP: Invalid input(s): "POCBNP" CBG: No results for input(s): "GLUCAP" in the last 168 hours. D-Dimer No results for input(s): "DDIMER" in the last 72 hours. Hgb A1c No results for input(s): "HGBA1C" in the last 72 hours. Lipid Profile No results for input(s): "CHOL", "HDL", "LDLCALC", "TRIG", "CHOLHDL", "LDLDIRECT" in the last 72 hours. Thyroid function studies No results for input(s): "TSH", "T4TOTAL", "T3FREE", "THYROIDAB" in the last 72 hours.  Invalid input(s): "FREET3" Anemia work up No results for input(s): "VITAMINB12", "FOLATE", "FERRITIN", "TIBC", "IRON", "RETICCTPCT" in the last 72 hours. Urinalysis    Component Value Date/Time   COLORURINE STRAW (A) 05/16/2022 1606   APPEARANCEUR CLEAR (A) 05/16/2022 1606   APPEARANCEUR Clear 07/16/2018 1452   LABSPEC 1.005 05/16/2022 1606   LABSPEC 1.013 09/01/2014 1142   PHURINE 6.0 05/16/2022 1606   GLUCOSEU NEGATIVE 05/16/2022 1606   GLUCOSEU Negative 09/01/2014 1142   HGBUR NEGATIVE 05/16/2022 [place] 05/16/2022 1606   BILIRUBINUR Negative 07/16/2018 1452   BILIRUBINUR Negative  09/01/2014 1142   KETONESUR NEGATIVE 05/16/2022 1606   PROTEINUR NEGATIVE 05/16/2022 1606   UROBILINOGEN 0.2 12/21/2015 1132   NITRITE NEGATIVE 05/16/2022 1606   LEUKOCYTESUR NEGATIVE 05/16/2022 1606   LEUKOCYTESUR Negative 09/01/2014 1142   Sepsis Labs Recent Labs  Lab 05/16/22 1329 05/17/22 0540  WBC 6.3 4.9   Microbiology Recent Results (from the past 240 hour(s))  Resp Panel by RT-PCR (Flu A&B, Covid) Anterior Nasal Swab     Status: None   Collection Time: 05/16/22  1:29 PM   Specimen: Anterior Nasal Swab  Result Value Ref Range Status   SARS Coronavirus 2 by RT PCR NEGATIVE NEGATIVE Final    Comment: (NOTE) SARS-CoV-2 target nucleic acids are NOT DETECTED.  The SARS-CoV-2 RNA is generally detectable in upper respiratory specimens during the acute phase of infection. The lowest concentration of SARS-CoV-2 viral copies this assay can detect is 138 copies/mL. A negative result does not preclude SARS-Cov-2 infection and should not be used as the sole basis for treatment or other patient management decisions. A negative result may occur with  improper specimen collection/handling, submission of specimen other than nasopharyngeal swab, presence of viral mutation(s) within the areas targeted by this assay, and inadequate number of viral copies(<138 copies/mL). A negative result must be combined with clinical observations, patient history, and epidemiological information. The expected result is Negative.  Fact Sheet for Patients:  EntrepreneurPulse.com.au  Fact Sheet for Healthcare Providers:  IncredibleEmployment.be  This test is no t yet approved or cleared by the [place] FDA and  has been authorized for detection and/or diagnosis of SARS-CoV-2 by FDA under an Emergency Use Authorization (EUA). This EUA will remain  in effect (meaning this test can be used) for the duration of the COVID-19 declaration under Section 564(b)(1) of  the Act, 21 U.S.C.section 360bbb-3(b)(1), unless the authorization is terminated  or revoked sooner.       Influenza A by PCR NEGATIVE NEGATIVE Final   Influenza B by PCR NEGATIVE NEGATIVE Final    Comment: (NOTE) The Xpert Xpress SARS-CoV-2/FLU/RSV plus assay is intended as an aid in the diagnosis of influenza from Nasopharyngeal swab  specimens and should not be used as a sole basis for treatment. Nasal washings and aspirates are unacceptable for Xpert Xpress SARS-CoV-2/FLU/RSV testing.  Fact Sheet for Patients: EntrepreneurPulse.com.au  Fact Sheet for Healthcare Providers: IncredibleEmployment.be  This test is not yet approved or cleared by the [place] FDA and has been authorized for detection and/or diagnosis of SARS-CoV-2 by FDA under an Emergency Use Authorization (EUA). This EUA will remain in effect (meaning this test can be used) for the duration of the COVID-19 declaration under Section 564(b)(1) of the Act, 21 U.S.C. section 360bbb-3(b)(1), unless the authorization is terminated or revoked.  Performed at [facility], [address] [place], [place] [address]   Culture, blood (routine x 2)     Status: None (Preliminary result)   Collection Time: 05/16/22  1:29 PM   Specimen: BLOOD  Result Value Ref Range Status   Specimen Description BLOOD BLOOD LEFT FOREARM  Final   Special Requests   Final    BOTTLES DRAWN AEROBIC AND ANAEROBIC Blood Culture adequate volume   Culture   Final    NO GROWTH 2 DAYS Performed at [facility], [address], [place], [place] [address]    Report Status PENDING  Incomplete  Culture, blood (routine x 2)     Status: None (Preliminary result)   Collection Time: 05/16/22  1:29 PM   Specimen: BLOOD  Result Value Ref Range Status   Specimen Description BLOOD BLOOD RIGHT HAND  Final   Special Requests   Final    BOTTLES DRAWN AEROBIC AND ANAEROBIC Blood Culture adequate  volume   Culture   Final    NO GROWTH 2 DAYS Performed at [facility], [address] [place], [place] [address]    Report Status PENDING  Incomplete  Urine Culture     Status: Abnormal   Collection Time: 05/16/22  4:06 PM   Specimen: Urine, Random  Result Value Ref Range Status   Specimen Description   Final    URINE, RANDOM Performed at [facility], [address], [place], [place] [address]    Special Requests   Final    NONE Performed at [facility], [address] [place], [place] [address]    Culture (A)  Final    <10,000 COLONIES/mL INSIGNIFICANT GROWTH Performed at [facility], [place] [address], [place], [place] [address]    Report Status 05/18/2022 FINAL  Final   Time coordinating discharge: 25 minutes  SIGNED: Antonieta Pert, MD  [facility] 05/18/2022, 12:36 PM  If 7PM-7AM, please contact night-coverage [email or web address]

## 2022-05-20 ENCOUNTER — Telehealth: Payer: Self-pay

## 2022-05-20 NOTE — Telephone Encounter (Addendum)
Transition Care Management Unsuccessful Follow-up Telephone Call  Date of discharge and from where:  05/18/22 [facility]  Attempts:  1st Attempt  Reason for unsuccessful TCM follow-up call:  Unable to reach patient. Hospital follow up scheduled 05/24/22 @ 11:00. Will follow.     Jinx Gilden O'Brien-Blaney LPN, [place], [place] Direct dial [phone] ??

## 2022-05-21 LAB — CULTURE, BLOOD (ROUTINE X 2)
Culture: NO GROWTH
Culture: NO GROWTH
Special Requests: ADEQUATE
Special Requests: ADEQUATE

## 2022-05-21 LAB — LEGIONELLA PNEUMOPHILA SEROGP 1 UR AG: L. pneumophila Serogp 1 Ur Ag: NEGATIVE

## 2022-05-21 NOTE — Telephone Encounter (Signed)
Transition Care Management Follow-up Telephone Call Date of discharge and from where: 05/18/22 [facility] How have you been since you were released from the hospital? Information received from son, HIPAA compliant. Denies pain, cough. Patient is at baseline with hospice care.  Any questions or concerns? No No further follow up from [place] at this time due to hospice care.   Any new allergies since your discharge? No  Dietary orders reviewed? Yes Do you have support at home? Yes   Functional Questionnaire: (I = Independent and D = Dependent) ADLs: Assist as needed  Follow up appointments reviewed:  PCP Hospital f/u appt confirmed? Yes  Scheduled to see PCP on 05/24/22 @ 11:00.  Are transportation arrangements needed? No  If their condition worsens, is the pt aware to call PCP or go to the Emergency Dept.? Yes Was the patient provided with contact information for the PCP's office or ED? Yes Was to pt encouraged to call back with questions or concerns? Yes   Frank Losey Motley LPN, [place], [place] Direct dial ??[phone]

## 2022-05-24 ENCOUNTER — Ambulatory Visit (INDEPENDENT_AMBULATORY_CARE_PROVIDER_SITE_OTHER): Admitting: Family Medicine

## 2022-05-24 ENCOUNTER — Encounter: Payer: Self-pay | Admitting: Family Medicine

## 2022-05-24 DIAGNOSIS — J690 Pneumonitis due to inhalation of food and vomit: Secondary | ICD-10-CM | POA: Diagnosis not present

## 2022-05-24 DIAGNOSIS — D649 Anemia, unspecified: Secondary | ICD-10-CM | POA: Diagnosis not present

## 2022-05-24 NOTE — Progress Notes (Signed)
[name], MD Phone: [phone]  Frank Mcdaniel is a 88 y.o. male who presents today for follow-up.  Aspiration pneumonia: Patient was recently hospitalized for aspiration pneumonia.  He was treated with IV antibiotics and then discharged on Augmentin.  His son notes he has 1 more dose left tonight.  He has had some loose stools and no diarrhea.  He has been eating yogurt to help with loose stools.  His cough is back to his baseline.  He said no fevers or shortness of breath.  He was slightly more anemic than usual when in the hospital.  Patient is on hospice.  Social History   Tobacco Use  Smoking Status Never  Smokeless Tobacco Never    Current Outpatient Medications on File Prior to Visit  Medication Sig Dispense Refill   aspirin EC 81 MG tablet Take 1 tablet (81 mg total) by mouth daily. Swallow whole. 90 tablet 3   atorvastatin (LIPITOR) 40 MG tablet TAKE 1/2 TABLETS (20 MG TOTAL) BY MOUTH DAILY. 45 tablet 3   calcium carbonate (TUMS EX) 750 MG chewable tablet Chew 1 tablet by mouth as needed (heartburn).     famotidine (PEPCID) 20 MG tablet TAKE 1 TABLET BY MOUTH EVERY DAY 90 tablet 3   finasteride (PROSCAR) 5 MG tablet TAKE 1 TABLET BY MOUTH EVERY DAY 90 tablet 3   hydrOXYzine (ATARAX) 10 MG tablet Take 10 mg by mouth 3 (three) times daily as needed for anxiety.     isosorbide mononitrate (IMDUR) 30 MG 24 hr tablet Take 1 tablet (30 mg total) by mouth daily. 90 tablet 3   memantine (NAMENDA) 5 MG tablet TAKE 1 TABLET BY MOUTH TWICE A DAY 180 tablet 1   Multiple Vitamin (MULTIVITAMIN) tablet Take 1 tablet by mouth daily.     pantoprazole (PROTONIX) 40 MG tablet TAKE 1 TABLET BY MOUTH EVERY DAY (Patient taking differently: Take 40 mg by mouth every evening.) 90 tablet 1   sertraline (ZOLOFT) 100 MG tablet TAKE 1 TABLET BY MOUTH EVERY DAY 90 tablet 1   tamsulosin (FLOMAX) 0.4 MG CAPS capsule Take 0.8 mg by mouth every evening.     No current facility-administered  medications on file prior to visit.     ROS see history of present illness  Objective  Physical Exam Vitals:   05/24/22 1106  BP: 118/70  Pulse: 66  Temp: 98.1 F (36.7 C)  SpO2: 95%    BP Readings from Last 3 Encounters:  05/24/22 118/70  05/18/22 (!) 123/49  04/29/22 120/60   Wt Readings from Last 3 Encounters:  05/24/22 171 lb 6.4 oz (77.7 kg)  05/16/22 168 lb 3.4 oz (76.3 kg)  04/29/22 168 lb 6.4 oz (76.4 kg)    Physical Exam Constitutional:      General: He is not in acute distress.    Appearance: He is not diaphoretic.  Cardiovascular:     Rate and Rhythm: Normal rate and regular rhythm.     Heart sounds: Normal heart sounds.  Pulmonary:     Effort: Pulmonary effort is normal.     Breath sounds: Normal breath sounds.  Skin:    General: Skin is warm and dry.  Neurological:     Mental Status: He is alert.      Assessment/Plan: Please see individual problem list.  Problem List Items Addressed This Visit     Anemia    Mildly worse during hospitalization.  Discussed the potential for rechecking though given his hospice status the  patient's son deferred follow-up lab work.      Aspiration pneumonia [facility])    Patient is back to his baseline.  He has recovered well.  Unfortunately this is going to be a recurrent issue and will likely happen in the future.  He is currently on hospice and we will defer any follow-up imaging given his hospice status.  I discussed this with his son who is in agreements.  They will monitor for recurrent symptoms.  I did discuss with the patient's son that he likely does not need to come in for hospital follow-up after each of these events if he returns to his baseline given that we are not actively treating him as he is on hospice.        Return for As scheduled.   [name], MD [place]

## 2022-05-24 NOTE — Assessment & Plan Note (Signed)
Mildly worse during hospitalization.  Discussed the potential for rechecking though given his hospice status the patient's son deferred follow-up lab work.

## 2022-05-24 NOTE — Assessment & Plan Note (Signed)
Patient is back to his baseline.  He has recovered well.  Unfortunately this is going to be a recurrent issue and will likely happen in the future.  He is currently on hospice and we will defer any follow-up imaging given his hospice status.  I discussed this with his son who is in agreements.  They will monitor for recurrent symptoms.  I did discuss with the patient's son that he likely does not need to come in for hospital follow-up after each of these events if he returns to his baseline given that we are not actively treating him as he is on hospice.

## 2022-07-16 ENCOUNTER — Telehealth: Payer: Self-pay | Admitting: Family Medicine

## 2022-07-16 NOTE — Telephone Encounter (Signed)
Declined AWV per son due to patient in hospice

## 2022-07-31 ENCOUNTER — Other Ambulatory Visit (HOSPITAL_COMMUNITY): Payer: Self-pay

## 2022-08-06 ENCOUNTER — Other Ambulatory Visit: Payer: Self-pay | Admitting: Family Medicine

## 2022-08-07 ENCOUNTER — Ambulatory Visit: Payer: PPO | Admitting: Gastroenterology

## 2022-08-12 ENCOUNTER — Other Ambulatory Visit: Payer: Self-pay | Admitting: Family Medicine

## 2022-09-03 ENCOUNTER — Other Ambulatory Visit: Payer: Self-pay | Admitting: Family Medicine

## 2022-10-28 ENCOUNTER — Telehealth: Payer: Self-pay

## 2022-10-28 ENCOUNTER — Ambulatory Visit (INDEPENDENT_AMBULATORY_CARE_PROVIDER_SITE_OTHER): Admitting: Family Medicine

## 2022-10-28 VITALS — BP 114/74 | HR 81 | Temp 98.7°F | Wt 167.4 lb

## 2022-10-28 DIAGNOSIS — F419 Anxiety disorder, unspecified: Secondary | ICD-10-CM

## 2022-10-28 DIAGNOSIS — F0390 Unspecified dementia without behavioral disturbance: Secondary | ICD-10-CM | POA: Diagnosis not present

## 2022-10-28 DIAGNOSIS — R509 Fever, unspecified: Secondary | ICD-10-CM | POA: Diagnosis not present

## 2022-10-28 DIAGNOSIS — I10 Essential (primary) hypertension: Secondary | ICD-10-CM | POA: Diagnosis not present

## 2022-10-28 LAB — COMPREHENSIVE METABOLIC PANEL
ALT: 4 U/L (ref 0–53)
AST: 13 U/L (ref 0–37)
Albumin: 4 g/dL (ref 3.5–5.2)
Alkaline Phosphatase: 66 U/L (ref 39–117)
BUN: 22 mg/dL (ref 6–23)
CO2: 26 mEq/L (ref 19–32)
Calcium: 8.8 mg/dL (ref 8.4–10.5)
Chloride: 107 mEq/L (ref 96–112)
Creatinine, Ser: 1.63 mg/dL — ABNORMAL HIGH (ref 0.40–1.50)
GFR: 37.48 mL/min — ABNORMAL LOW (ref >60.00)
Glucose, Bld: 87 mg/dL (ref 70–99)
Potassium: 4 mEq/L (ref 3.5–5.1)
Sodium: 141 mEq/L (ref 135–145)
Total Bilirubin: 0.5 mg/dL (ref 0.2–1.2)
Total Protein: 6.2 g/dL (ref 6.0–8.3)

## 2022-10-28 LAB — CBC WITH DIFFERENTIAL/PLATELET
Basophils Absolute: 0 10*3/uL (ref 0.0–0.1)
Basophils Relative: 0.2 % (ref 0.0–3.0)
Eosinophils Absolute: 0.3 10*3/uL (ref 0.0–0.7)
Eosinophils Relative: 14.5 % — ABNORMAL HIGH (ref 0.0–5.0)
HCT: 25.5 % — ABNORMAL LOW (ref 39.0–52.0)
Hemoglobin: 8.5 g/dL — ABNORMAL LOW (ref 13.0–17.0)
Lymphocytes Relative: 24 % (ref 12.0–46.0)
Lymphs Abs: 0.5 10*3/uL — ABNORMAL LOW (ref 0.7–4.0)
MCHC: 33.4 g/dL (ref 30.0–36.0)
MCV: 102.1 fl — ABNORMAL HIGH (ref 78.0–100.0)
Monocytes Absolute: 0.1 10*3/uL (ref 0.1–1.0)
Monocytes Relative: 3.6 % (ref 3.0–12.0)
Neutro Abs: 1.1 10*3/uL — ABNORMAL LOW (ref 1.4–7.7)
Neutrophils Relative %: 57.7 % (ref 43.0–77.0)
Platelets: 121 10*3/uL — ABNORMAL LOW (ref 150.0–400.0)
RBC: 2.49 Mil/uL — ABNORMAL LOW (ref 4.22–5.81)
RDW: 18.1 % — ABNORMAL HIGH (ref 11.5–15.5)
WBC: 2 10*3/uL — ABNORMAL LOW (ref 4.0–10.5)

## 2022-10-28 NOTE — Assessment & Plan Note (Signed)
Undetermined cause.  May be that he is getting ill intermittently though I would expect the fever to be more persistent if that is occurring.  It could be related to his leukemia.  We are checking a CBC today and can check his counts with that.  We will also check a CMP and TSH.  They will monitor.

## 2022-10-28 NOTE — Assessment & Plan Note (Signed)
Chronic issue.  Progressively worsening.  Monitor at this time.

## 2022-10-28 NOTE — Assessment & Plan Note (Signed)
Chronic issue.  Has worsened some recently.  Discussed potential for increasing the Zoloft though given the potential for this not making a difference we opted to remain on his current dose and to continue as needed use of hydroxyzine and the lorazepam that hospice has provided.

## 2022-10-28 NOTE — Assessment & Plan Note (Signed)
Chronic issue.  Adequately controlled.  Continue Imdur 30 mg daily.

## 2022-10-28 NOTE — Progress Notes (Signed)
[name], MD Phone: [phone]  Frank Mcdaniel is a 89 y.o. male who presents today for follow-up.  Hypertension: Patient is on Imdur.  No chest pain or shortness of breath.  Dementia/anxiety: Patient's son reports both are worse.  He is on Zoloft.  He is no longer on Namenda.  They note hydroxyzine and lorazepam help as needed for anxiety.  Intermittent fever: Patient's son notes that there are times where he will seem to be weaker than normal and the son will check his temperature and it will typically be between 100.2 and 100.6 though is sometimes a little higher.  Notes he discussed this with hospice initially and they advised to take 2 Tylenol and if the fever did not come down to call them back.  He notes each time this has occurred the patient takes the Tylenol and the fever goes away and does not recur.  He notes it can be 4 to 10 weeks between episodes of this.  The son notes that the hospice physician noted the only thing they could think that could be contributing would be the patient's leukemia.  He no longer follows with hematology.  They do report some easy bruising for the past 6 to 8 months that maybe is a little worse than it was previously.  Social History   Tobacco Use  Smoking Status Never  Smokeless Tobacco Never    Current Outpatient Medications on File Prior to Visit  Medication Sig Dispense Refill   aspirin EC 81 MG tablet Take 1 tablet (81 mg total) by mouth daily. Swallow whole. 90 tablet 3   atorvastatin (LIPITOR) 40 MG tablet TAKE 1/2 TABLETS (20 MG TOTAL) BY MOUTH DAILY. 45 tablet 3   calcium carbonate (TUMS EX) 750 MG chewable tablet Chew 1 tablet by mouth as needed (heartburn).     famotidine (PEPCID) 20 MG tablet TAKE 1 TABLET BY MOUTH EVERY DAY 90 tablet 3   finasteride (PROSCAR) 5 MG tablet TAKE 1 TABLET BY MOUTH DAILY 90 tablet 3   hydrOXYzine (ATARAX) 10 MG tablet Take 10 mg by mouth 3 (three) times daily as needed for anxiety.      isosorbide mononitrate (IMDUR) 30 MG 24 hr tablet Take 1 tablet (30 mg total) by mouth daily. 90 tablet 3   memantine (NAMENDA) 5 MG tablet TAKE 1 TABLET BY MOUTH TWICE A DAY 180 tablet 1   Multiple Vitamin (MULTIVITAMIN) tablet Take 1 tablet by mouth daily.     pantoprazole (PROTONIX) 40 MG tablet Take 1 tablet (40 mg total) by mouth every evening. 30 tablet 5   sertraline (ZOLOFT) 100 MG tablet TAKE 1 TABLET BY MOUTH EVERY DAY 90 tablet 1   tamsulosin (FLOMAX) 0.4 MG CAPS capsule Take 0.8 mg by mouth every evening.     No current facility-administered medications on file prior to visit.     ROS see history of present illness  Objective  Physical Exam Vitals:   10/28/22 1204  BP: 114/74  Pulse: 81  Temp: 98.7 F (37.1 C)  SpO2: 93%    BP Readings from Last 3 Encounters:  10/28/22 114/74  05/24/22 118/70  05/18/22 (!) 123/49   Wt Readings from Last 3 Encounters:  10/28/22 167 lb 6.4 oz (75.9 kg)  05/24/22 171 lb 6.4 oz (77.7 kg)  05/16/22 168 lb 3.4 oz (76.3 kg)    Physical Exam Constitutional:      General: He is not in acute distress.    Appearance: He is not  diaphoretic.  Cardiovascular:     Rate and Rhythm: Normal rate and regular rhythm.     Heart sounds: Normal heart sounds.  Pulmonary:     Effort: Pulmonary effort is normal.     Breath sounds: Normal breath sounds.  Skin:    General: Skin is warm and dry.  Neurological:     Mental Status: He is alert.      Assessment/Plan: Please see individual problem list.  Primary hypertension Assessment & Plan: Chronic issue.  Adequately controlled.  Continue Imdur 30 mg daily.   Dementia without behavioral disturbance ([place]) Assessment & Plan: Chronic issue.  Progressively worsening.  Monitor at this time.   Anxiety Assessment & Plan: Chronic issue.  Has worsened some recently.  Discussed potential for increasing the Zoloft though given the potential for this not making a difference we opted to remain on  his current dose and to continue as needed use of hydroxyzine and the lorazepam that hospice has provided.   Fever, intermittent Assessment & Plan: Undetermined cause.  May be that he is getting ill intermittently though I would expect the fever to be more persistent if that is occurring.  It could be related to his leukemia.  We are checking a CBC today and can check his counts with that.  We will also check a CMP and TSH.  They will monitor.  Orders: -     CBC with Differential/Platelet -     Comprehensive metabolic panel -     TSH     Return in about 6 months (around 04/30/2023).   [name], MD [place]

## 2022-10-28 NOTE — Telephone Encounter (Signed)
Patient's son, Ripken Monterrosa, states he would like for [place] to please call him with the results of patient's labs, whether normal or abnormal.

## 2022-10-28 NOTE — Telephone Encounter (Signed)
Noted  

## 2022-10-29 ENCOUNTER — Other Ambulatory Visit: Payer: Self-pay | Admitting: Family Medicine

## 2022-10-29 DIAGNOSIS — I2510 Atherosclerotic heart disease of native coronary artery without angina pectoris: Secondary | ICD-10-CM

## 2022-10-29 LAB — TSH: TSH: 1.29 u[IU]/mL (ref 0.35–5.50)

## 2022-11-05 ENCOUNTER — Telehealth: Payer: Self-pay | Admitting: Family Medicine

## 2022-11-05 NOTE — Telephone Encounter (Addendum)
Amy from [facility] called back wanting to give the BP reading to the cma. It was 96/60 and pt is on two different medications for prostate and what to know if need to stay on both of them because that can be a cause for low BP

## 2022-11-05 NOTE — Telephone Encounter (Signed)
Amy from [facility] called stating pt has low BP and Amy want to know if he need to hold off on taking medciations

## 2022-11-05 NOTE — Telephone Encounter (Signed)
Spoke to Amy at [facility] she states the Patient's BP is 90/50. Patient is "really weak" and light headed when getting up per the Patient. I gave Dr. Ellen Henri recommendation to Amy which is to recheck the BP and hold the Imdur and if the Patient has chest pains then he will need to be seen immediately. Amy voiced understanding and is agreeable.

## 2022-11-05 NOTE — Telephone Encounter (Signed)
Can you find out how low the BP is? The only medication he is on that could be affecting his BP is the imdur.

## 2022-11-06 NOTE — Telephone Encounter (Signed)
Noted. I would wait to see how he does with stopping the imdur. If his BP is not trending up with stopping the imdur they should contact us again.

## 2022-11-07 NOTE — Telephone Encounter (Signed)
Called and spoek with Frank Mcdaniel to inform her to withhold the imdu and if things are not trending up to reach back out. Frank Mcdaniel stated that imdur has been withheld since Tuesday and she will check his BP today and reach back out if not.

## 2022-12-24 ENCOUNTER — Other Ambulatory Visit: Payer: Self-pay | Admitting: Family Medicine

## 2022-12-25 NOTE — Telephone Encounter (Signed)
LOV: 10/28/22  NOV: 04/30/23

## 2023-01-11 ENCOUNTER — Other Ambulatory Visit: Payer: Self-pay | Admitting: Family Medicine

## 2023-01-23 ENCOUNTER — Telehealth: Payer: Self-pay | Admitting: Family Medicine

## 2023-01-23 DIAGNOSIS — R32 Unspecified urinary incontinence: Secondary | ICD-10-CM

## 2023-01-23 NOTE — Telephone Encounter (Signed)
Mindy from [facility] called in stating that they are having trouble calming pt, so they wanted to know what idea the provider might have? Maybe increasing LORazepam (ATIVAN) 0.5 MG tablet, or trying something different?  Any questions or concern, she's available [phone].

## 2023-01-24 NOTE — Telephone Encounter (Signed)
Noted. With the urinary incontinence he should have his urine checked to see if he has a UTI that could be contributing to his symptoms. This needs to be done prior to increasing the dose of ativan or adding other medication. If needed they can come to the office for a lab appointment to provide a urine sample.

## 2023-01-24 NOTE — Telephone Encounter (Signed)
Called Don at [facility] with Dr. Purvis Sheffield recommendations for the Patient. Frank Mcdaniel understands and is agreeable.

## 2023-01-24 NOTE — Addendum Note (Signed)
Addended by: Glori Luis on: 01/24/2023 03:42 PM   Modules accepted: Orders

## 2023-01-24 NOTE — Telephone Encounter (Signed)
Frank Mcdaniel at [facility] states he does not know how we got roped into this but he does not believe the Patient has a UTI and he has spoke with the NP that they have and stated that they will take care of the Patient but if Dr. Birdie Sons wants a sample he will send one over.

## 2023-01-24 NOTE — Telephone Encounter (Signed)
Attempted to call Roe Coombs at [facility] back but there was no voicemail

## 2023-01-24 NOTE — Telephone Encounter (Signed)
Point-of-care urinalysis ordered.

## 2023-01-24 NOTE — Telephone Encounter (Signed)
I spoke to [name] at [name] and he states the only issues are trouble calming the Patient and urinary incontinence issues. Frank Mcdaniel states everything else is the same with the Patient.

## 2023-01-24 NOTE — Telephone Encounter (Signed)
Noted  

## 2023-01-24 NOTE — Telephone Encounter (Signed)
Can you call and find out what symptoms he is having? Has anything changed other than trouble calming him? Is he having any other symptoms such as fever, urinary issues, respiratory issues, other symptoms? Thanks.

## 2023-01-28 DIAGNOSIS — H903 Sensorineural hearing loss, bilateral: Secondary | ICD-10-CM | POA: Diagnosis not present

## 2023-02-05 ENCOUNTER — Telehealth: Payer: Self-pay | Admitting: Cardiovascular Disease

## 2023-02-05 NOTE — Telephone Encounter (Signed)
Pt's son called to let us know that the patient is currently in hospice and will not need any appointments at this time.

## 2023-03-20 ENCOUNTER — Encounter (INDEPENDENT_AMBULATORY_CARE_PROVIDER_SITE_OTHER): Payer: Self-pay

## 2023-04-08 DIAGNOSIS — R000 Tachycardia, unspecified: Secondary | ICD-10-CM | POA: Diagnosis not present

## 2023-04-08 DIAGNOSIS — I959 Hypotension, unspecified: Secondary | ICD-10-CM | POA: Diagnosis not present

## 2023-04-24 ENCOUNTER — Telehealth: Payer: Self-pay | Admitting: Family Medicine

## 2023-04-24 NOTE — Telephone Encounter (Signed)
Pt son called stating the pt passed away on 05-01-2023 and the pt son want to know if he can become a new patient and take his father place. I told him that Birdie Sons is not taking on patients at this time but I will send a message back

## 2023-04-25 NOTE — Telephone Encounter (Signed)
Returned phone call and let son know that Dr. Birdie Sons would be leaving the practice.  Son verbalized understanding.

## 2023-04-25 NOTE — Telephone Encounter (Signed)
Please inform the patient's son that I will be leaving the practice likely in the next 5 months, so I am not taking on any new patients.

## 2023-04-30 ENCOUNTER — Ambulatory Visit: Payer: PPO | Admitting: Family Medicine

## 2023-05-06 DEATH — deceased
